# Patient Record
Sex: Female | Born: 1945
Health system: Southern US, Community
[De-identification: ages and names within clinical notes are randomized; demographics above are authoritative.]

## PROBLEM LIST (undated history)

## (undated) DIAGNOSIS — R112 Nausea with vomiting, unspecified: Secondary | ICD-10-CM

## (undated) DIAGNOSIS — Z9889 Other specified postprocedural states: Secondary | ICD-10-CM

## (undated) DIAGNOSIS — K5790 Diverticulosis of intestine, part unspecified, without perforation or abscess without bleeding: Secondary | ICD-10-CM

## (undated) DIAGNOSIS — Z5189 Encounter for other specified aftercare: Secondary | ICD-10-CM

## (undated) DIAGNOSIS — IMO0002 Reserved for concepts with insufficient information to code with codable children: Secondary | ICD-10-CM

## (undated) DIAGNOSIS — K7581 Nonalcoholic steatohepatitis (NASH): Secondary | ICD-10-CM

## (undated) DIAGNOSIS — E785 Hyperlipidemia, unspecified: Secondary | ICD-10-CM

## (undated) DIAGNOSIS — I219 Acute myocardial infarction, unspecified: Secondary | ICD-10-CM

## (undated) DIAGNOSIS — I8290 Acute embolism and thrombosis of unspecified vein: Secondary | ICD-10-CM

## (undated) DIAGNOSIS — T7840XA Allergy, unspecified, initial encounter: Secondary | ICD-10-CM

## (undated) DIAGNOSIS — E1165 Type 2 diabetes mellitus with hyperglycemia: Secondary | ICD-10-CM

## (undated) DIAGNOSIS — E669 Obesity, unspecified: Secondary | ICD-10-CM

## (undated) DIAGNOSIS — I10 Essential (primary) hypertension: Secondary | ICD-10-CM

## (undated) DIAGNOSIS — N329 Bladder disorder, unspecified: Secondary | ICD-10-CM

## (undated) DIAGNOSIS — R569 Unspecified convulsions: Secondary | ICD-10-CM

## (undated) DIAGNOSIS — G709 Myoneural disorder, unspecified: Secondary | ICD-10-CM

## (undated) DIAGNOSIS — M4802 Spinal stenosis, cervical region: Secondary | ICD-10-CM

## (undated) DIAGNOSIS — E876 Hypokalemia: Secondary | ICD-10-CM

## (undated) DIAGNOSIS — M1990 Unspecified osteoarthritis, unspecified site: Secondary | ICD-10-CM

## (undated) DIAGNOSIS — D689 Coagulation defect, unspecified: Secondary | ICD-10-CM

## (undated) DIAGNOSIS — T4145XA Adverse effect of unspecified anesthetic, initial encounter: Secondary | ICD-10-CM

## (undated) DIAGNOSIS — R0600 Dyspnea, unspecified: Secondary | ICD-10-CM

## (undated) DIAGNOSIS — K644 Residual hemorrhoidal skin tags: Secondary | ICD-10-CM

## (undated) DIAGNOSIS — N261 Atrophy of kidney (terminal): Secondary | ICD-10-CM

## (undated) DIAGNOSIS — I2510 Atherosclerotic heart disease of native coronary artery without angina pectoris: Secondary | ICD-10-CM

## (undated) DIAGNOSIS — N200 Calculus of kidney: Secondary | ICD-10-CM

## (undated) DIAGNOSIS — T8859XA Other complications of anesthesia, initial encounter: Secondary | ICD-10-CM

## (undated) DIAGNOSIS — R011 Cardiac murmur, unspecified: Secondary | ICD-10-CM

## (undated) DIAGNOSIS — K219 Gastro-esophageal reflux disease without esophagitis: Secondary | ICD-10-CM

## (undated) DIAGNOSIS — C801 Malignant (primary) neoplasm, unspecified: Secondary | ICD-10-CM

## (undated) DIAGNOSIS — IMO0001 Reserved for inherently not codable concepts without codable children: Secondary | ICD-10-CM

## (undated) DIAGNOSIS — N390 Urinary tract infection, site not specified: Secondary | ICD-10-CM

## (undated) HISTORY — DX: Other specified postprocedural states: Z98.890

## (undated) HISTORY — DX: Cardiac murmur, unspecified: R01.1

## (undated) HISTORY — DX: Diverticulosis of intestine, part unspecified, without perforation or abscess without bleeding: K57.90

## (undated) HISTORY — PX: VAGINAL HYSTERECTOMY: SUR661

## (undated) HISTORY — PX: TONSILLECTOMY: SUR1361

## (undated) HISTORY — PX: CATARACT EXTRACTION: SUR2

## (undated) HISTORY — DX: Obesity, unspecified: E66.9

## (undated) HISTORY — DX: Gastro-esophageal reflux disease without esophagitis: K21.9

## (undated) HISTORY — DX: Essential (primary) hypertension: I10

## (undated) HISTORY — DX: Spinal stenosis, cervical region: M48.02

## (undated) HISTORY — DX: Atrophy of kidney (terminal): N26.1

## (undated) HISTORY — PX: UPPER GI ENDOSCOPY: SHX6162

## (undated) HISTORY — PX: BREAST MASS EXCISION: SHX1267

## (undated) HISTORY — PX: SALPINGOOPHORECTOMY: SHX82

## (undated) HISTORY — DX: Unspecified osteoarthritis, unspecified site: M19.90

## (undated) HISTORY — DX: Hyperlipidemia, unspecified: E78.5

## (undated) HISTORY — DX: Type 2 diabetes mellitus with hyperglycemia: E11.65

## (undated) HISTORY — DX: Encounter for other specified aftercare: Z51.89

## (undated) HISTORY — DX: Urinary tract infection, site not specified: N39.0

## (undated) HISTORY — DX: Reserved for inherently not codable concepts without codable children: IMO0001

## (undated) HISTORY — DX: Hypokalemia: E87.6

## (undated) HISTORY — PX: COLONOSCOPY: SHX174

## (undated) HISTORY — DX: Calculus of kidney: N20.0

## (undated) HISTORY — DX: Myoneural disorder, unspecified: G70.9

## (undated) HISTORY — DX: Allergy, unspecified, initial encounter: T78.40XA

## (undated) HISTORY — PX: BREAST CYST ASPIRATION: SHX578

## (undated) HISTORY — DX: Atherosclerotic heart disease of native coronary artery without angina pectoris: I25.10

## (undated) HISTORY — DX: Nonalcoholic steatohepatitis (NASH): K75.81

## (undated) HISTORY — DX: Acute myocardial infarction, unspecified: I21.9

## (undated) HISTORY — PX: URETEROTOMY: SHX496

## (undated) HISTORY — DX: Residual hemorrhoidal skin tags: K64.4

## (undated) HISTORY — DX: Coagulation defect, unspecified: D68.9

## (undated) HISTORY — DX: Reserved for concepts with insufficient information to code with codable children: IMO0002

---
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
## 1980-05-04 HISTORY — PX: ABDOMINAL HYSTERECTOMY: SHX81

## 1989-01-02 HISTORY — PX: BREAST CYST ASPIRATION: SHX578

## 1998-05-04 DIAGNOSIS — I219 Acute myocardial infarction, unspecified: Secondary | ICD-10-CM

## 1998-05-04 HISTORY — DX: Acute myocardial infarction, unspecified: I21.9

## 1998-05-04 HISTORY — PX: PTCA: SHX146

## 1998-05-04 HISTORY — PX: CARDIAC CATHETERIZATION: SHX172

## 1998-05-04 HISTORY — PX: OTHER SURGICAL HISTORY: SHX169

## 2003-05-05 DIAGNOSIS — K5790 Diverticulosis of intestine, part unspecified, without perforation or abscess without bleeding: Secondary | ICD-10-CM

## 2003-05-05 DIAGNOSIS — Z9889 Other specified postprocedural states: Secondary | ICD-10-CM

## 2003-05-05 HISTORY — DX: Other specified postprocedural states: Z98.890

## 2003-05-05 HISTORY — DX: Diverticulosis of intestine, part unspecified, without perforation or abscess without bleeding: K57.90

## 2003-12-25 LAB — HM COLONOSCOPY: HM Colonoscopy: NORMAL

## 2004-05-23 ENCOUNTER — Ambulatory Visit: Payer: Self-pay | Admitting: General Surgery

## 2005-03-10 ENCOUNTER — Ambulatory Visit: Payer: Self-pay | Admitting: General Surgery

## 2006-03-18 ENCOUNTER — Ambulatory Visit: Payer: Self-pay | Admitting: General Surgery

## 2006-05-04 HISTORY — PX: FOOT SURGERY: SHX648

## 2007-04-05 ENCOUNTER — Ambulatory Visit: Payer: Self-pay | Admitting: General Surgery

## 2008-04-05 ENCOUNTER — Ambulatory Visit: Payer: Self-pay | Admitting: General Surgery

## 2008-08-22 ENCOUNTER — Ambulatory Visit: Payer: Self-pay | Admitting: Family

## 2008-08-30 ENCOUNTER — Ambulatory Visit: Payer: Self-pay | Admitting: Internal Medicine

## 2009-02-21 ENCOUNTER — Ambulatory Visit: Payer: Self-pay | Admitting: Internal Medicine

## 2009-04-09 ENCOUNTER — Ambulatory Visit: Payer: Self-pay | Admitting: General Surgery

## 2010-02-13 ENCOUNTER — Ambulatory Visit: Payer: Self-pay | Admitting: Internal Medicine

## 2010-05-04 DIAGNOSIS — R011 Cardiac murmur, unspecified: Secondary | ICD-10-CM

## 2010-05-04 HISTORY — DX: Cardiac murmur, unspecified: R01.1

## 2010-05-04 HISTORY — PX: EYE SURGERY: SHX253

## 2010-05-07 ENCOUNTER — Ambulatory Visit: Payer: Self-pay | Admitting: General Surgery

## 2010-05-30 DIAGNOSIS — K7581 Nonalcoholic steatohepatitis (NASH): Secondary | ICD-10-CM

## 2010-05-30 HISTORY — DX: Nonalcoholic steatohepatitis (NASH): K75.81

## 2010-07-01 ENCOUNTER — Encounter (INDEPENDENT_AMBULATORY_CARE_PROVIDER_SITE_OTHER): Payer: Self-pay | Admitting: *Deleted

## 2010-07-10 ENCOUNTER — Other Ambulatory Visit: Payer: Self-pay | Admitting: Internal Medicine

## 2010-07-10 NOTE — Letter (Signed)
Summary: New Patient letter  [facility]  [address] [place], [place] [address]   Phone: [phone]  Fax: [phone]       07/01/2010 MRN: [id number]  [facility] [place], [place]  [address]  Dear Kimberly Huff,  Welcome to the Gastroenterology Division at [facility].    You are scheduled to see Dr.  Delfin Edis on August 12, 2010 at 9:15am on the 3rd floor at [facility] [place] [facility].  We ask that you try to arrive at our office 15 minutes prior to your appointment time to allow for check-in.  We would like you to complete the enclosed self-administered evaluation form prior to your visit and bring it with you on the day of your appointment.  We will review it with you.  Also, please bring a complete list of all your medications or, if you prefer, bring the medication bottles and we will list them.  Please bring your insurance card so that we may make a copy of it.  If your insurance requires a referral to see a specialist, please bring your referral form from your primary care physician.  Co-payments are due at the time of your visit and may be paid by cash, check or credit card.     Your office visit will consist of a consult with your physician (includes a physical exam), any laboratory testing he/she may order, scheduling of any necessary diagnostic testing (e.g. x-ray, ultrasound, CT-scan), and scheduling of a procedure (e.g. Endoscopy, Colonoscopy) if required.  Please allow enough time on your schedule to allow for any/all of these possibilities.    If you cannot keep your appointment, please call [phone] to cancel or reschedule prior to your appointment date.  This allows [place] the opportunity to schedule an appointment for another patient in need of care.  If you do not cancel or reschedule by 5 p.m. the business day prior to your appointment date, you will be charged a $50.00 late cancellation/no-show fee.    Thank you for choosing  [facility] for your medical needs.  We appreciate the opportunity to care for you.  Please visit [place] at our website  to learn more about our practice.                     Sincerely,                                                             The Gastroenterology Division

## 2010-08-12 ENCOUNTER — Ambulatory Visit (INDEPENDENT_AMBULATORY_CARE_PROVIDER_SITE_OTHER): Payer: BLUE CROSS/BLUE SHIELD | Admitting: Internal Medicine

## 2010-08-12 ENCOUNTER — Encounter: Payer: Self-pay | Admitting: Internal Medicine

## 2010-08-12 DIAGNOSIS — K7460 Unspecified cirrhosis of liver: Secondary | ICD-10-CM

## 2010-08-12 DIAGNOSIS — K766 Portal hypertension: Secondary | ICD-10-CM

## 2010-08-12 NOTE — Progress Notes (Signed)
Kimberly Huff November 27, 1945 MRN [id number]    History of Present Illness:  This is a 65 year old white female with a new diagnosis of nonalcoholic steatohepatitis and early cirrhosis based on a liver biopsy at [facility] in January 2012; there was a question of autoimmune hepatitis because of positive anti-smooth muscle antibody. The liver biopsy leaned toward steatohepatitis with stage III fibrosis, periportal and central lobular fibrosis, 34-66% steatosis, mild to moderate portal inflammation. The morphological features of liver biopsy did not support autoimmune hepatitis. A CT scan of the abdomen in February 2012 showed mild splenomegaly at 12 cm with no varices. It also showed fatty liver and patent portal vein. Her alpha-fetoprotein is normal at 6.5. Her last colonoscopy in 2005 showed diverticulosis. She has been on a low fat, carbohydrate modified diet and has lost several pounds. She is here to schedule an upper endoscopy to rule out esophageal varices. I have reviewed her extensive history from [facility].   Past Medical History  Diagnosis Date  . Kidney stone   . Diverticulosis 2005    Dr Jamal Collin  . Coronary artery disease   . Hypertension   . Hyperlipidemia   . Type II or unspecified type diabetes mellitus without mention of complication, uncontrolled   . Hypopotassemia   . Atrophic kidney     right  . Osteoarthritis   . Coronary atherosclerosis   . Obesity   . Myocardial infarction 2000  . Recurrent UTI   . Steatohepatitis 05/30/10    Brunt Stage 3   Past Surgical History  Procedure Date  . Breast mass excision     benign  . Ureterotomy 1960/1969    x 2 during childhood  . Abdominal hysterectomy 1982    complete  . Ptca 2000    reports that she quit smoking about 11 years ago. Her smoking use included Cigarettes. She has never used smokeless tobacco. She reports that she drinks alcohol. She reports that she does not use illicit drugs. family history includes Autoimmune  disease in her daughter; Diverticulosis in her mother; Lung cancer in her father; and Thyroid disease in her daughter and sister. No Known Allergies      Review of Systems: Denies chest pain, dysphagia, heartburn, abdominal pain, swelling or bruising.  The remainder of the 10  point ROS is negative except as outlined in H&P   Physical Exam: General appearance  Well developed, in no distress, overweight. Eyes- non icteric. HEENT nontraumatic, normocephalic. Mouth no lesions, tongue papillated, no cheilosis. Neck supple without adenopathy, thyroid not enlarged, no carotid bruits, no JVD. Lungs Clear to auscultation bilaterally. Cor normal S1, normal S2, regular rhythm , no murmur,  quiet precordium. Abdomen obese, soft. Mild tenderness in right lower quadrant. Liver edge at costal margin. Overall span about 8 cm. Splenic tip not palpable. No ascites. Rectal: Not done. Extremities no pedal edema. Skin early palmar erythema. No Dupuytren's contractures. Neurological alert and oriented x 3, no asterixis. Psychological normal mood and affect.  Assessment and Plan:  Problems #1 early cirrhosis due to steatohepatitis. Patient also has mild portal hypertension. There were no varices seen on her CT scan but she will be scheduled for an upper endoscopy to assess closer. She is currently asymptomatic and is working on significant weight loss. I have explained to her the significance of esophageal varices.   Problem #2 neoplastic screening for colon cancer. Her last colonoscopy was in 2005. A recall colonoscopy will be due in 2015.  Problem #3 positive anti-smooth muscle antibody.  This is not likely causing her liver disease.   08/12/2010 Delfin Edis

## 2010-08-12 NOTE — Patient Instructions (Addendum)
You have been scheduled for an endoscopy on 08/21/10. Please follow written instructions given to you at your visit today.  Dr Derrel Nip,

## 2010-08-20 ENCOUNTER — Encounter: Payer: Self-pay | Admitting: Internal Medicine

## 2010-08-21 ENCOUNTER — Telehealth: Payer: Self-pay | Admitting: Internal Medicine

## 2010-08-21 ENCOUNTER — Encounter: Payer: Self-pay | Admitting: Internal Medicine

## 2010-08-21 ENCOUNTER — Ambulatory Visit (AMBULATORY_SURGERY_CENTER): Payer: BLUE CROSS/BLUE SHIELD | Admitting: Internal Medicine

## 2010-08-21 VITALS — BP 116/77 | HR 68 | Temp 98.8°F | Resp 18 | Ht 63.0 in | Wt 200.0 lb

## 2010-08-21 DIAGNOSIS — I8500 Esophageal varices without bleeding: Secondary | ICD-10-CM

## 2010-08-21 DIAGNOSIS — K2970 Gastritis, unspecified, without bleeding: Secondary | ICD-10-CM

## 2010-08-21 DIAGNOSIS — K7689 Other specified diseases of liver: Secondary | ICD-10-CM

## 2010-08-21 DIAGNOSIS — K319 Disease of stomach and duodenum, unspecified: Secondary | ICD-10-CM

## 2010-08-21 DIAGNOSIS — K259 Gastric ulcer, unspecified as acute or chronic, without hemorrhage or perforation: Secondary | ICD-10-CM

## 2010-08-21 LAB — GLUCOSE, CAPILLARY
Glucose-Capillary: 147 mg/dL — ABNORMAL HIGH (ref 70–99)
Glucose-Capillary: 125 mg/dL — ABNORMAL HIGH (ref 70–99)

## 2010-08-21 MED ORDER — SODIUM CHLORIDE 0.9 % IV SOLN: 500.0000 mL | INTRAVENOUS | Status: DC

## 2010-08-21 MED ORDER — ESOMEPRAZOLE MAGNESIUM 20 MG PO CPDR: 20.0000 mg | DELAYED_RELEASE_CAPSULE | Freq: Every day | ORAL | Status: DC

## 2010-08-21 NOTE — Telephone Encounter (Signed)
Pt states that Nexium is too expensive and needs a new medication rx.  Per Dr. Olevia Perches, ok to change to Prilosec 67m daily, #30, 12 refills.  [place]

## 2010-08-21 NOTE — Patient Instructions (Signed)
Please read the handout given to you by the recovery room nurse.  Do not take NSAIDS.  Take your nexium for 6-8 wks EVERYDAY.  Call us if you have any questions or concerns at [phone].   Take your other regular medicines.  Thank-you.

## 2010-08-22 ENCOUNTER — Telehealth: Payer: Self-pay | Admitting: Internal Medicine

## 2010-08-22 ENCOUNTER — Other Ambulatory Visit: Payer: Self-pay | Admitting: *Deleted

## 2010-08-22 ENCOUNTER — Telehealth: Payer: Self-pay

## 2010-08-22 ENCOUNTER — Telehealth: Payer: Self-pay | Admitting: *Deleted

## 2010-08-22 DIAGNOSIS — K2970 Gastritis, unspecified, without bleeding: Secondary | ICD-10-CM

## 2010-08-22 NOTE — Telephone Encounter (Signed)
Routed to C. Mccoy RN to resend rx

## 2010-08-22 NOTE — Telephone Encounter (Signed)
Attempted to send RX to pharmacy, but not able to.  Verbal order given to pharmacy tech at [facility] in [place], [place].  Prilosec 20 mg po qd, #30, 11 refills given verbally per C McCoy RN

## 2010-08-22 NOTE — Telephone Encounter (Signed)
No answer/ answering machine picked up but no ID, no message left. MAW, RN

## 2010-08-27 ENCOUNTER — Encounter: Payer: Self-pay | Admitting: Internal Medicine

## 2010-12-25 ENCOUNTER — Encounter: Payer: Self-pay | Admitting: *Deleted

## 2010-12-25 NOTE — Telephone Encounter (Signed)
Erroneous encounter

## 2011-02-16 ENCOUNTER — Encounter: Payer: Self-pay | Admitting: Internal Medicine

## 2011-02-16 ENCOUNTER — Ambulatory Visit (INDEPENDENT_AMBULATORY_CARE_PROVIDER_SITE_OTHER): Payer: 59 | Admitting: Internal Medicine

## 2011-02-16 DIAGNOSIS — E119 Type 2 diabetes mellitus without complications: Secondary | ICD-10-CM

## 2011-02-16 DIAGNOSIS — Z9849 Cataract extraction status, unspecified eye: Secondary | ICD-10-CM

## 2011-02-16 DIAGNOSIS — K7460 Unspecified cirrhosis of liver: Secondary | ICD-10-CM | POA: Insufficient documentation

## 2011-02-16 DIAGNOSIS — K7581 Nonalcoholic steatohepatitis (NASH): Secondary | ICD-10-CM

## 2011-02-16 DIAGNOSIS — R52 Pain, unspecified: Secondary | ICD-10-CM

## 2011-02-16 DIAGNOSIS — N289 Disorder of kidney and ureter, unspecified: Secondary | ICD-10-CM | POA: Insufficient documentation

## 2011-02-16 DIAGNOSIS — L0292 Furuncle, unspecified: Secondary | ICD-10-CM

## 2011-02-16 DIAGNOSIS — N1330 Unspecified hydronephrosis: Secondary | ICD-10-CM

## 2011-02-16 DIAGNOSIS — E785 Hyperlipidemia, unspecified: Secondary | ICD-10-CM

## 2011-02-16 DIAGNOSIS — E669 Obesity, unspecified: Secondary | ICD-10-CM

## 2011-02-16 DIAGNOSIS — Z23 Encounter for immunization: Secondary | ICD-10-CM

## 2011-02-16 DIAGNOSIS — K7689 Other specified diseases of liver: Secondary | ICD-10-CM

## 2011-02-16 DIAGNOSIS — L0293 Carbuncle, unspecified: Secondary | ICD-10-CM

## 2011-02-16 DIAGNOSIS — E1169 Type 2 diabetes mellitus with other specified complication: Secondary | ICD-10-CM | POA: Insufficient documentation

## 2011-02-16 MED ORDER — OXYCODONE HCL 5 MG PO CAPS: 5.0000 mg | ORAL_CAPSULE | ORAL | Status: AC | PRN

## 2011-02-16 MED ORDER — SULFAMETHOXAZOLE-TMP DS 800-160 MG PO TABS: 1.0000 | ORAL_TABLET | Freq: Two times a day (BID) | ORAL | Status: AC

## 2011-02-16 MED ORDER — SIMVASTATIN 20 MG PO TABS: 20.0000 mg | ORAL_TABLET | Freq: Every day | ORAL | Status: DC

## 2011-02-16 NOTE — Patient Instructions (Signed)
Suspend the metformin to see if diarrhea stops.  We are reducing simvastatin to 20 mg daily  .  Use the  bactrim/Septra for next occurrence of of boil

## 2011-02-16 NOTE — Assessment & Plan Note (Addendum)
She has lost 30 lbs since April 2012 simply by avoiding starches .

## 2011-02-16 NOTE — Assessment & Plan Note (Signed)
LDL is below 70 but HDL is 21 which is lower than April 2012 despite adherence to the Mediterranean diet and daily ingestion of nuts and avocadoes.  Prior trial of niacin and fish oil were not helpful.  Will reduce her simvastatin to 20 mg daily and recheck in 3 months.

## 2011-02-16 NOTE — Assessment & Plan Note (Signed)
Stable , followed by Dr. Gerald Dexter at [facility].

## 2011-02-16 NOTE — Progress Notes (Signed)
  Subjective:    Patient ID: Kimberly Huff, female    DOB: [birth year], 65 y.o.   MRN: [id number]  HPI  Review of Systems     Objective:   Physical Exam        Assessment & Plan:   Subjective:     Kimberly Huff is a 65 y.o. female who presents for follow up of diabetes.. Current symptoms include: diarrhea. Patient denies foot ulcerations, hypoglycemia , nausea and paresthesia of the feet. Evaluation to date has Huff: fasting lipid panel, hemoglobin A1C and microalbuminuria. Home sugars: BGs consistently in an acceptable range. Current treatments: more intensive attention to diet which has Huff effective, low cholesterol diet which has Huff effective, weight loss of 30 lbs which has Huff effective and Continued metformin which has Huff effective. Last dilated eye exam within the last year.   The following portions of the patient's history were reviewed and updated as appropriate: allergies, current medications, past family history, past medical history, past social history, past surgical history and problem list.  Review of Systems A comprehensive review of systems was negative except for: Gastrointestinal: positive for diarrhea    Objective:    BP 123/85  Pulse 68  Temp(Src) 98.5 F (36.9 C) (Oral)  Resp 16  Ht 5' 3.5" (1.613 m)  Wt 179 lb 4 oz (81.307 kg)  BMI 31.25 kg/m2  SpO2 97% General appearance: alert, cooperative and appears stated age Eyes: conjunctivae/corneas clear. PERRL, EOM's intact. Fundi benign. Ears: normal TM's and external ear canals both ears Throat: lips, mucosa, and tongue normal; teeth and gums normal Neck: no adenopathy, no carotid bruit, no JVD, supple, symmetrical, trachea midline and thyroid not enlarged, symmetric, no tenderness/mass/nodules Lungs: clear to auscultation bilaterally Heart: regular rate and rhythm, S1, S2 normal, no murmur, click, rub or gallop Abdomen: soft, non-tender; bowel sounds normal; no masses,  no organomegaly Extremities:  extremities normal, atraumatic, no cyanosis or edema Pulses: 2+ and symmetric    @DMFOOTEXAM @  Patient was evaluated for proper footwear and sizing.  Laboratory: No components found with this basename: A1C      Assessment:    Diabetes mellitus Type II, under excellent control.    Plan:    Discussed general issues about diabetes pathophysiology and management. Discontinued metformin; see  medication orders.

## 2011-02-25 ENCOUNTER — Telehealth: Payer: Self-pay | Admitting: Internal Medicine

## 2011-02-25 DIAGNOSIS — K219 Gastro-esophageal reflux disease without esophagitis: Secondary | ICD-10-CM

## 2011-02-25 DIAGNOSIS — I10 Essential (primary) hypertension: Secondary | ICD-10-CM

## 2011-02-25 MED ORDER — POTASSIUM CHLORIDE 10 MEQ PO TBCR: 10.0000 meq | EXTENDED_RELEASE_TABLET | Freq: Every day | ORAL | Status: DC

## 2011-02-25 MED ORDER — OMEPRAZOLE 20 MG PO CPDR: 20.0000 mg | DELAYED_RELEASE_CAPSULE | Freq: Every day | ORAL | Status: DC

## 2011-02-25 MED ORDER — FUROSEMIDE 40 MG PO TABS: 40.0000 mg | ORAL_TABLET | Freq: Every day | ORAL | Status: DC

## 2011-02-25 MED ORDER — LOSARTAN POTASSIUM-HCTZ 50-12.5 MG PO TABS: 1.0000 | ORAL_TABLET | Freq: Every day | ORAL | Status: DC

## 2011-02-25 NOTE — Telephone Encounter (Signed)
Pt called  To get rx  She didn't get these when she was in last week Furosemide 74m hyzarr 12.5 mg Potassium  Omeprazole  1 year rx  [facility]

## 2011-02-25 NOTE — Telephone Encounter (Signed)
Done

## 2011-02-25 NOTE — Telephone Encounter (Signed)
[place] for RF's X 1 year?

## 2011-03-04 NOTE — Telephone Encounter (Signed)
Addended by: Emeline Gins on: 03/04/2011 03:05 PM   Modules accepted: Orders

## 2011-04-21 ENCOUNTER — Encounter: Payer: Self-pay | Admitting: Internal Medicine

## 2011-04-21 ENCOUNTER — Ambulatory Visit (INDEPENDENT_AMBULATORY_CARE_PROVIDER_SITE_OTHER): Payer: 59 | Admitting: Internal Medicine

## 2011-04-21 VITALS — BP 121/76 | HR 82 | Temp 99.1°F | Wt 182.8 lb

## 2011-04-21 DIAGNOSIS — E1169 Type 2 diabetes mellitus with other specified complication: Secondary | ICD-10-CM

## 2011-04-21 DIAGNOSIS — M109 Gout, unspecified: Secondary | ICD-10-CM

## 2011-04-21 DIAGNOSIS — K219 Gastro-esophageal reflux disease without esophagitis: Secondary | ICD-10-CM

## 2011-04-21 DIAGNOSIS — E785 Hyperlipidemia, unspecified: Secondary | ICD-10-CM

## 2011-04-21 DIAGNOSIS — E119 Type 2 diabetes mellitus without complications: Secondary | ICD-10-CM

## 2011-04-21 DIAGNOSIS — E669 Obesity, unspecified: Secondary | ICD-10-CM

## 2011-04-21 MED ORDER — OMEPRAZOLE 40 MG PO CPDR: 40.0000 mg | DELAYED_RELEASE_CAPSULE | Freq: Every day | ORAL | Status: DC

## 2011-04-21 MED ORDER — IBUPROFEN 800 MG PO TABS: 800.0000 mg | ORAL_TABLET | Freq: Three times a day (TID) | ORAL | Status: AC | PRN

## 2011-04-21 NOTE — Assessment & Plan Note (Signed)
She has gained 3 lbs in the last 2 months after a 30 lb weight loss achieved with starch restriction.  Encouragement given.

## 2011-04-21 NOTE — Assessment & Plan Note (Signed)
She has the classic manifestation involving her great toe on the right.  Will treat with NSAIDS, pain relievers,  This is her first episode in several months.  Will add a uric acid level to her next lab draw and tret with allopurinol for goal < 6.0

## 2011-04-21 NOTE — Patient Instructions (Addendum)
Take 4 ibuprofen every 8 hours for the gout flare.  Stop when the redness and swelling improve or immediately if you develop abdominal pain .  Take tramadol along with ibuprofen for pain control.  Take 2 omeprazole (40 mg ) daily while taking the ibuprofen

## 2011-04-21 NOTE — Progress Notes (Signed)
  Subjective:    Patient ID: Kimberly Huff, female    DOB: 1945/07/06, 65 y.o.   MRN: [id number]  HPI  Kimberly Huff is a 65 yo white female with a history of NASH, DM, na d hypertension who presents with a 2 day history of pain, swelling and erythema of her right great toe.  Symptoms begain 2 days ago .  The toe is painful to touch or bear weight.  She has taken tramadol without relief of pain but aspirin has provided moderate relief. She has a history of previous episodes of pain and erythema but none in over one year. No history of trauma but she did walk for several hours last weekend.    Review of Systems  Constitutional: Negative for fever, chills and unexpected weight change.  HENT: Negative for hearing loss, ear pain, nosebleeds, congestion, sore throat, facial swelling, rhinorrhea, sneezing, mouth sores, trouble swallowing, neck pain, neck stiffness, voice change, postnasal drip, sinus pressure, tinnitus and ear discharge.   Eyes: Negative for pain, discharge, redness and visual disturbance.  Respiratory: Negative for cough, chest tightness, shortness of breath, wheezing and stridor.   Cardiovascular: Negative for chest pain, palpitations and leg swelling.  Musculoskeletal: Negative for myalgias and arthralgias.  Skin: Negative for color change and rash.  Neurological: Negative for dizziness, weakness, light-headedness and headaches.  Hematological: Negative for adenopathy.       Objective:   Physical Exam  Constitutional: She is oriented to person, place, and time. She appears well-developed and well-nourished.  HENT:  Mouth/Throat: Oropharynx is clear and moist.  Eyes: EOM are normal. Pupils are equal, round, and reactive to light. No scleral icterus.  Neck: Normal range of motion. Neck supple. No JVD present. No thyromegaly present.  Cardiovascular: Normal rate, regular rhythm, normal heart sounds and intact distal pulses.   Pulmonary/Chest: Effort normal and breath sounds normal.    Abdominal: Soft. Bowel sounds are normal. She exhibits no mass. There is no tenderness.  Musculoskeletal: Normal range of motion. She exhibits edema.       Feet:  Lymphadenopathy:    She has no cervical adenopathy.  Neurological: She is alert and oriented to person, place, and time.  Skin: Skin is warm and dry.  Psychiatric: She has a normal mood and affect.          Assessment & Plan:

## 2011-04-21 NOTE — Assessment & Plan Note (Signed)
She is due for repeat lipids,  Had very low HDL and LDL was at goal last visit.

## 2011-04-24 ENCOUNTER — Other Ambulatory Visit: Payer: Self-pay | Admitting: *Deleted

## 2011-04-24 ENCOUNTER — Telehealth: Payer: Self-pay | Admitting: Internal Medicine

## 2011-04-24 DIAGNOSIS — M109 Gout, unspecified: Secondary | ICD-10-CM

## 2011-04-24 MED ORDER — PREDNISONE (PAK) 10 MG PO TABS: ORAL_TABLET | ORAL | Status: AC

## 2011-04-24 MED ORDER — METFORMIN HCL 500 MG PO TABS: 500.0000 mg | ORAL_TABLET | Freq: Two times a day (BID) | ORAL | Status: DC

## 2011-04-24 NOTE — Telephone Encounter (Signed)
Patient states her foot is still bothering her and wanted to know if you could call in the prednisone that you offered her on Tuesday.  [facility] [address]

## 2011-04-24 NOTE — Telephone Encounter (Signed)
I called Kimberly Huff and advised that Dr. Derrel Nip had called in the prescription and advised her that she might want to call the drug store before going for prescription.

## 2011-04-24 NOTE — Telephone Encounter (Signed)
[facility]

## 2011-05-11 ENCOUNTER — Ambulatory Visit: Payer: Self-pay | Admitting: General Surgery

## 2011-05-26 ENCOUNTER — Encounter: Payer: Self-pay | Admitting: Internal Medicine

## 2011-05-26 ENCOUNTER — Ambulatory Visit (INDEPENDENT_AMBULATORY_CARE_PROVIDER_SITE_OTHER): Payer: 59 | Admitting: Internal Medicine

## 2011-05-26 DIAGNOSIS — K649 Unspecified hemorrhoids: Secondary | ICD-10-CM

## 2011-05-26 DIAGNOSIS — M109 Gout, unspecified: Secondary | ICD-10-CM

## 2011-05-26 DIAGNOSIS — E118 Type 2 diabetes mellitus with unspecified complications: Secondary | ICD-10-CM

## 2011-05-26 DIAGNOSIS — E119 Type 2 diabetes mellitus without complications: Secondary | ICD-10-CM

## 2011-05-26 DIAGNOSIS — E1169 Type 2 diabetes mellitus with other specified complication: Secondary | ICD-10-CM

## 2011-05-26 DIAGNOSIS — E039 Hypothyroidism, unspecified: Secondary | ICD-10-CM

## 2011-05-26 DIAGNOSIS — E785 Hyperlipidemia, unspecified: Secondary | ICD-10-CM

## 2011-05-26 LAB — LIPID PANEL
Cholesterol: 155 mg/dL (ref 0–200)
HDL: 31.8 mg/dL — ABNORMAL LOW (ref >39.00)
Total CHOL/HDL Ratio: 5
Triglycerides: 202 mg/dL — ABNORMAL HIGH (ref 0.0–149.0)
VLDL: 40.4 mg/dL — ABNORMAL HIGH (ref 0.0–40.0)

## 2011-05-26 LAB — URIC ACID: Uric Acid, Serum: 8.1 mg/dL — ABNORMAL HIGH (ref 2.4–7.0)

## 2011-05-26 LAB — HEMOGLOBIN A1C: Hgb A1c MFr Bld: 5.7 % (ref 4.6–6.5)

## 2011-05-26 LAB — LDL CHOLESTEROL, DIRECT: Direct LDL: 94.1 mg/dL

## 2011-05-26 LAB — TSH: TSH: 0.51 u[IU]/mL (ref 0.35–5.50)

## 2011-05-26 MED ORDER — HYDROCORTISONE 2.5 % RE CREA: TOPICAL_CREAM | Freq: Two times a day (BID) | RECTAL | Status: AC

## 2011-05-26 MED ORDER — TRAMADOL HCL 50 MG PO TABS: 50.0000 mg | ORAL_TABLET | Freq: Four times a day (QID) | ORAL | Status: DC | PRN

## 2011-05-26 MED ORDER — OXYCODONE-ACETAMINOPHEN 5-325 MG PO TABS: 1.0000 | ORAL_TABLET | Freq: Three times a day (TID) | ORAL | Status: AC | PRN

## 2011-05-26 MED ORDER — LOSARTAN POTASSIUM 50 MG PO TABS: 50.0000 mg | ORAL_TABLET | Freq: Every day | ORAL | Status: DC

## 2011-05-26 NOTE — Progress Notes (Signed)
  Subjective:    Patient ID: Kimberly Huff, female    DOB: December 07, 1945, 66 y.o.   MRN: [id number]  HPI  [name] . is a 66 year old white female with a history of fatty liver, diabetes mellitu,s and coronary artery disease who returns for three-month followup on diabetes. Over the last several weeks she has had a gout flare. This has nearly resolved but was followed by a skin boil which was treated with antibiotics. Following resolution of the skin boil she had onset of low back pain radiating to her groin on both sides which she treated with massage and nonsteroidal anti-inflammatories    Two days ago had a massive diarrheal episode, which resolved but has left her with a large irritated hemorrhoid.         Review of Systems  Constitutional: Negative for fever, chills and unexpected weight change.  HENT: Negative for hearing loss, ear pain, nosebleeds, congestion, sore throat, facial swelling, rhinorrhea, sneezing, mouth sores, trouble swallowing, neck pain, neck stiffness, voice change, postnasal drip, sinus pressure, tinnitus and ear discharge.   Eyes: Negative for pain, discharge, redness and visual disturbance.  Respiratory: Negative for cough, chest tightness, shortness of breath, wheezing and stridor.   Cardiovascular: Negative for chest pain, palpitations and leg swelling.  Musculoskeletal: Negative for myalgias and arthralgias.  Skin: Negative for color change and rash.  Neurological: Negative for dizziness, weakness, light-headedness and headaches.  Hematological: Negative for adenopathy.       Objective:   Physical Exam  Constitutional: She is oriented to person, place, and time. She appears well-developed and well-nourished.  HENT:  Mouth/Throat: Oropharynx is clear and moist.  Eyes: EOM are normal. Pupils are equal, round, and reactive to light. No scleral icterus.  Neck: Normal range of motion. Neck supple. No JVD present. No thyromegaly present.  Cardiovascular: Normal rate,  regular rhythm, normal heart sounds and intact distal pulses.   Pulmonary/Chest: Effort normal and breath sounds normal.  Abdominal: Soft. Bowel sounds are normal. She exhibits no mass. There is no tenderness.  Genitourinary:     Musculoskeletal: Normal range of motion. She exhibits no edema.  Lymphadenopathy:    She has no cervical adenopathy.  Neurological: She is alert and oriented to person, place, and time.  Skin: Skin is warm and dry.  Psychiatric: She has a normal mood and affect.          Assessment & Plan:

## 2011-05-27 ENCOUNTER — Encounter: Payer: Self-pay | Admitting: Internal Medicine

## 2011-05-27 DIAGNOSIS — E1129 Type 2 diabetes mellitus with other diabetic kidney complication: Secondary | ICD-10-CM | POA: Insufficient documentation

## 2011-05-27 DIAGNOSIS — K649 Unspecified hemorrhoids: Secondary | ICD-10-CM | POA: Insufficient documentation

## 2011-05-27 LAB — COMPLETE METABOLIC PANEL WITH GFR
ALT: 28 U/L (ref 0–35)
AST: 24 U/L (ref 0–37)
Albumin: 5 g/dL (ref 3.5–5.2)
Alkaline Phosphatase: 77 U/L (ref 39–117)
BUN: 19 mg/dL (ref 6–23)
CO2: 25 mEq/L (ref 19–32)
Calcium: 10.1 mg/dL (ref 8.4–10.5)
Chloride: 99 mEq/L (ref 96–112)
Creat: 0.7 mg/dL (ref 0.50–1.10)
GFR, Est African American: >89 mL/min
GFR, Est Non African American: >89 mL/min
Glucose, Bld: 105 mg/dL — ABNORMAL HIGH (ref 70–99)
Potassium: 4.2 mEq/L (ref 3.5–5.3)
Sodium: 140 mEq/L (ref 135–145)
Total Bilirubin: 0.8 mg/dL (ref 0.3–1.2)
Total Protein: 7.1 g/dL (ref 6.0–8.3)

## 2011-05-27 LAB — HM MAMMOGRAPHY: HM Mammogram: NORMAL

## 2011-05-27 MED ORDER — ALLOPURINOL 100 MG PO TABS: 100.0000 mg | ORAL_TABLET | Freq: Every day | ORAL | Status: DC

## 2011-05-27 MED ORDER — SIMVASTATIN 40 MG PO TABS: 40.0000 mg | ORAL_TABLET | Freq: Every day | ORAL | Status: DC

## 2011-05-27 NOTE — Assessment & Plan Note (Signed)
This is her second third gout attack in the last 12 months. Her flare has resolved and her uric acid level today is 8.1. Start allopurinol 100 mg daily for goal uric acid level of less than 6.0. I have advised her to take a nonsteroidal anti-inflammatory while initiating this medication as the changes in uric acid though can precipitate gout flare.

## 2011-05-27 NOTE — Assessment & Plan Note (Addendum)
Her control is excellent on metformin alone with hemoglobin A1c less than 6.0.  She started taking an ARB, a statin, and  baby aspirin daily.

## 2011-05-27 NOTE — Assessment & Plan Note (Signed)
She has an irritated hemorrhoid as a result of her recent diarrheal episode. It is nonthrombosed. I have recommended that she soak in the sitz bath to shrink the swelling and have prescribed Anusol HC to use twice daily as needed.

## 2011-05-27 NOTE — Assessment & Plan Note (Signed)
She currently takes simvastatin 40 mg daily for goal LDL of less than 70. She is due for fasting lipids today. Her LDL is at goal and no medication changes were made.

## 2011-05-28 ENCOUNTER — Telehealth: Payer: Self-pay | Admitting: Internal Medicine

## 2011-05-28 ENCOUNTER — Ambulatory Visit (INDEPENDENT_AMBULATORY_CARE_PROVIDER_SITE_OTHER): Payer: 59 | Admitting: Internal Medicine

## 2011-05-28 VITALS — BP 110/68 | HR 84 | Temp 100.0°F

## 2011-05-28 DIAGNOSIS — J111 Influenza due to unidentified influenza virus with other respiratory manifestations: Secondary | ICD-10-CM

## 2011-05-28 DIAGNOSIS — N390 Urinary tract infection, site not specified: Secondary | ICD-10-CM

## 2011-05-28 DIAGNOSIS — R509 Fever, unspecified: Secondary | ICD-10-CM

## 2011-05-28 LAB — POCT URINALYSIS DIPSTICK
Bilirubin, UA: NEGATIVE
Glucose, UA: NEGATIVE
Ketones, UA: NEGATIVE
Nitrite, UA: NEGATIVE
Spec Grav, UA: 1.015
Urobilinogen, UA: 2
pH, UA: 7.5

## 2011-05-28 LAB — POCT INFLUENZA A/B
Influenza A, POC: NEGATIVE
Influenza B, POC: NEGATIVE

## 2011-05-28 MED ORDER — CIPROFLOXACIN HCL 250 MG PO TABS: 250.0000 mg | ORAL_TABLET | Freq: Two times a day (BID) | ORAL | Status: AC

## 2011-05-28 MED ORDER — OSELTAMIVIR PHOSPHATE 75 MG PO CAPS: 75.0000 mg | ORAL_CAPSULE | Freq: Two times a day (BID) | ORAL | Status: AC

## 2011-05-28 MED ORDER — PROMETHAZINE HCL 25 MG PO TABS: 25.0000 mg | ORAL_TABLET | Freq: Three times a day (TID) | ORAL | Status: DC | PRN

## 2011-05-28 NOTE — Progress Notes (Signed)
Subjective:    Patient ID: Kimberly Huff, female    DOB: November 01, 1945, 66 y.o.   MRN: [id number]  HPI   Kimberly Huff is a 66 year old white female with a history of diabetes mellitus and hepatic steatosis/fatty liver who who was just seen 2 days ago for her three-month followup presents today with sudden onset of fevers chills and low back pain which started less than 24 hours ago. She feels the worst she has felt in years and wonders if she has the flu. She did have a flu shot over one month ago. She has noticed some fevers with rigors which started yesterday.  She has taken Advil for the symptoms and her last dose was this morning.   Past Medical History  Diagnosis Date  . Kidney stone   . Diverticulosis 2005    Dr Jamal Collin  . Coronary artery disease   . Hypertension   . Hyperlipidemia   . Type II or unspecified type diabetes mellitus without mention of complication, uncontrolled   . Hypopotassemia   . Atrophic kidney     right  . Osteoarthritis   . Coronary atherosclerosis   . Obesity   . Myocardial infarction 2000  . Recurrent UTI   . Steatohepatitis 05/30/10    Brunt Stage 3  . Diabetes mellitus   . Allergy   . Blood transfusion   . Cataract   . Clotting disorder     blood clots in abdomen after surgery  . Heart murmur   . Neuromuscular disorder     "achy muscles"   Current Outpatient Prescriptions on File Prior to Visit  Medication Sig Dispense Refill  . allopurinol (ZYLOPRIM) 100 MG tablet Take 1 tablet (100 mg total) by mouth daily.  30 tablet  6  . aspirin 81 MG tablet Take 81 mg by mouth daily.        . Cholecalciferol (VITAMIN D3) 2000 UNITS capsule Take 2,000 Units by mouth daily.        Marland Kitchen Fexofenadine HCl (ALLEGRA PO) Take 1 tablet by mouth daily as needed.       . furosemide (LASIX) 40 MG tablet Take 1 tablet (40 mg total) by mouth daily.  30 tablet  11  . hydrocortisone (ANUSOL-HC) 2.5 % rectal cream Place rectally 2 (two) times daily.  30 g  0  . losartan (COZAAR) 50  MG tablet Take 1 tablet (50 mg total) by mouth daily.  90 tablet  3  . metFORMIN (GLUCOPHAGE) 500 MG tablet Take 1 tablet (500 mg total) by mouth 2 (two) times daily with a meal.  60 tablet  2  . NEVANAC 0.1 % ophthalmic suspension       . omeprazole (PRILOSEC) 40 MG capsule Take 1 capsule (40 mg total) by mouth daily.  30 capsule  11  . oxyCODONE-acetaminophen (ROXICET) 5-325 MG per tablet Take 1 tablet by mouth every 8 (eight) hours as needed for pain.  30 tablet  0  . potassium chloride (KLOR-CON) 10 MEQ CR tablet Take 1 tablet (10 mEq total) by mouth daily.  30 tablet  11  . prednisoLONE acetate (PRED FORTE) 1 % ophthalmic suspension       . simvastatin (ZOCOR) 40 MG tablet Take 1 tablet (40 mg total) by mouth at bedtime.  90 tablet  3  . traMADol (ULTRAM) 50 MG tablet Take 1 tablet (50 mg total) by mouth every 6 (six) hours as needed.  60 tablet  2      Review  of Systems  Constitutional: Positive for fever and chills. Negative for unexpected weight change.  HENT: Negative for hearing loss, ear pain, nosebleeds, congestion, sore throat, facial swelling, rhinorrhea, sneezing, mouth sores, trouble swallowing, neck pain, neck stiffness, voice change, postnasal drip, sinus pressure, tinnitus and ear discharge.   Eyes: Negative for pain, discharge, redness and visual disturbance.  Respiratory: Negative for cough, chest tightness, shortness of breath, wheezing and stridor.   Cardiovascular: Negative for chest pain, palpitations and leg swelling.  Musculoskeletal: Positive for back pain. Negative for myalgias and arthralgias.  Skin: Negative for color change and rash.  Neurological: Positive for weakness and light-headedness. Negative for dizziness and headaches.  Hematological: Negative for adenopathy.       Objective:   Physical Exam  Constitutional: She is oriented to person, place, and time. She appears well-developed and well-nourished. She appears distressed.  HENT:  Mouth/Throat:  Oropharynx is clear and moist.  Eyes: EOM are normal. Pupils are equal, round, and reactive to light. No scleral icterus.  Neck: Normal range of motion. Neck supple. No JVD present. No thyromegaly present.  Cardiovascular: Normal rate, regular rhythm, normal heart sounds and intact distal pulses.   Pulmonary/Chest: Effort normal and breath sounds normal.  Abdominal: Soft. Bowel sounds are normal. She exhibits no mass. There is no tenderness.  Genitourinary: Vaginal discharge found.  Musculoskeletal: Normal range of motion. She exhibits no edema.  Lymphadenopathy:    She has no cervical adenopathy.  Neurological: She is alert and oriented to person, place, and time.  Skin: Skin is warm and dry.  Psychiatric: She has a normal mood and affect.          Assessment & Plan:   Influenza Despite the negative influenza rapid influenza test we noted there is a 30% false-negative rate. She has symptoms and signs consistent with influenza and is within the window period for use of Tamiflu. We'll prescribe Tamiflu along with Cipro for presumed urinary tract infection. Continue ibuprofen for relief of myalgias and avoiding Tylenol due to the history of fatty liver.    Updated Medication List Outpatient Encounter Prescriptions as of 05/28/2011  Medication Sig Dispense Refill  . allopurinol (ZYLOPRIM) 100 MG tablet Take 1 tablet (100 mg total) by mouth daily.  30 tablet  6  . aspirin 81 MG tablet Take 81 mg by mouth daily.        . Cholecalciferol (VITAMIN D3) 2000 UNITS capsule Take 2,000 Units by mouth daily.        . ciprofloxacin (CIPRO) 250 MG tablet Take 1 tablet (250 mg total) by mouth 2 (two) times daily.  14 tablet  7  . Fexofenadine HCl (ALLEGRA PO) Take 1 tablet by mouth daily as needed.       . furosemide (LASIX) 40 MG tablet Take 1 tablet (40 mg total) by mouth daily.  30 tablet  11  . hydrocortisone (ANUSOL-HC) 2.5 % rectal cream Place rectally 2 (two) times daily.  30 g  0  .  losartan (COZAAR) 50 MG tablet Take 1 tablet (50 mg total) by mouth daily.  90 tablet  3  . metFORMIN (GLUCOPHAGE) 500 MG tablet Take 1 tablet (500 mg total) by mouth 2 (two) times daily with a meal.  60 tablet  2  . NEVANAC 0.1 % ophthalmic suspension       . omeprazole (PRILOSEC) 40 MG capsule Take 1 capsule (40 mg total) by mouth daily.  30 capsule  11  . oseltamivir (TAMIFLU) 75 MG  capsule Take 1 capsule (75 mg total) by mouth 2 (two) times daily.  10 capsule  0  . oxyCODONE-acetaminophen (ROXICET) 5-325 MG per tablet Take 1 tablet by mouth every 8 (eight) hours as needed for pain.  30 tablet  0  . potassium chloride (KLOR-CON) 10 MEQ CR tablet Take 1 tablet (10 mEq total) by mouth daily.  30 tablet  11  . prednisoLONE acetate (PRED FORTE) 1 % ophthalmic suspension       . promethazine (PHENERGAN) 25 MG tablet Take 1 tablet (25 mg total) by mouth every 8 (eight) hours as needed for nausea.  20 tablet  0  . simvastatin (ZOCOR) 40 MG tablet Take 1 tablet (40 mg total) by mouth at bedtime.  90 tablet  3  . traMADol (ULTRAM) 50 MG tablet Take 1 tablet (50 mg total) by mouth every 6 (six) hours as needed.  60 tablet  2

## 2011-05-28 NOTE — Telephone Encounter (Signed)
Patient came in and saw Dr. Derrel Nip this a.m.

## 2011-05-29 ENCOUNTER — Telehealth: Payer: Self-pay | Admitting: *Deleted

## 2011-05-29 DIAGNOSIS — J111 Influenza due to unidentified influenza virus with other respiratory manifestations: Secondary | ICD-10-CM | POA: Insufficient documentation

## 2011-05-29 NOTE — Patient Instructions (Signed)
I am treating you for the flu and for a UTI.

## 2011-05-29 NOTE — Telephone Encounter (Signed)
Advised pt

## 2011-05-29 NOTE — Assessment & Plan Note (Signed)
Despite the negative influenza rapid influenza test we noted there is a 30% false-negative rate. She has symptoms and signs consistent with influenza and is within the window period for use of Tamiflu. We'll prescribe Tamiflu along with Cipro for presumed urinary tract infection. Continue ibuprofen for relief of myalgias and avoiding Tylenol due to the history of fatty liver.

## 2011-05-29 NOTE — Telephone Encounter (Signed)
Advised pt of lab results.  She doesn't know if she should take the allopurinol because of her liver disease and other health problems.  She would like your opinion on that.

## 2011-05-29 NOTE — Telephone Encounter (Signed)
Yes, she can take the allopurimol.  It si avery low dose and there is no C/I with her liver issues

## 2011-05-31 LAB — URINE CULTURE: Colony Count: >=100000

## 2011-07-03 ENCOUNTER — Encounter: Payer: Self-pay | Admitting: Internal Medicine

## 2011-07-03 ENCOUNTER — Ambulatory Visit (INDEPENDENT_AMBULATORY_CARE_PROVIDER_SITE_OTHER): Payer: 59 | Admitting: Internal Medicine

## 2011-07-03 ENCOUNTER — Telehealth: Payer: Self-pay | Admitting: Internal Medicine

## 2011-07-03 VITALS — BP 120/84 | HR 80 | Temp 97.9°F

## 2011-07-03 DIAGNOSIS — N390 Urinary tract infection, site not specified: Secondary | ICD-10-CM

## 2011-07-03 LAB — POCT URINALYSIS DIPSTICK
Bilirubin, UA: NEGATIVE
Glucose, UA: NEGATIVE
Ketones, UA: NEGATIVE
Nitrite, UA: POSITIVE
Spec Grav, UA: 1.015
Urobilinogen, UA: 1
pH, UA: 7.5

## 2011-07-03 MED ORDER — SULFAMETHOXAZOLE-TRIMETHOPRIM 800-160 MG PO TABS: 1.0000 | ORAL_TABLET | Freq: Two times a day (BID) | ORAL | Status: AC

## 2011-07-03 MED ORDER — PHENAZOPYRIDINE HCL 200 MG PO TABS: 200.0000 mg | ORAL_TABLET | Freq: Three times a day (TID) | ORAL | Status: AC | PRN

## 2011-07-03 NOTE — Telephone Encounter (Signed)
Patient scheduled with Dr. Derrel Nip today

## 2011-07-03 NOTE — Telephone Encounter (Signed)
[facility] Triage Call Report Triage Record Num: [id number] Operator: Lebron Conners Patient Name: Kimberly Huff Call Date & Time: 07/03/2011 9:54:50AM Patient Phone: [phone] PCP: Deborra Medina Patient Gender: Female PCP Fax : [phone] Patient DOB: [birth year] Practice Name: [facility] Reason for Call: Caller: [name]/Patient; PCP: Deborra Medina; CB#: [phone]; Calling today 07/03/11 regarding have urinary symptoms x1 week, hurts when she urinates. Emergent symptoms r/o by Urinary Symptoms Female guidelines with exception of has one or more urinary symptoms and has not been previously evaluated. Care advice given. Appt scheduled for today 07/03/11 at 3:15 PM with Dr. Derrel Nip. Protocol(s) Used: Urinary Symptoms - Female Recommended Outcome per Protocol: See Provider within 24 hours Reason for Outcome: Has one or more urinary tract symptoms AND has not been previously evaluated Care Advice: ~ Tell provider medical history of renal disease; especially if have only one kidney. ~ Call provider if you develop flank or low back pain, fever, generally feel sick. Increase intake of fluids. Try to drink 8 oz. (.2 liter) every hour when awake, including unsweetened cranberry juice, unless on restricted fluids for other medical reasons. Take sips of fluid or eat ice chips if nauseated or vomiting. ~ ~ Tell your provider if you are taking Warfarin (Coumadin) and drinking cranberry juice or taking cranberry capsules. ~ SYMPTOM / CONDITION MANAGEMENT ~ CAUTIONS Limit carbonated, alcoholic, and caffeinated beverages such as coffee, tea and soda. Avoid nonprescription cold and allergy medications that contain caffeine. Limit intake of tomatoes, fruit juices (except for unsweetened cranberry juice), dairy products, spicy foods, sugar, and artificial sweeteners (aspartame or saccharine). Stop or decrease smoking. Reducing exposure to bladder irritants may help lessen  urgency. ~ Analgesic/Antipyretic Advice - Acetaminophen: Consider acetaminophen as directed on label or by pharmacist/provider for pain or fever PRECAUTIONS: - Use if there is no history of liver disease, alcoholism, or intake of three or more alcohol drinks per day - Only if approved by provider during pregnancy or when breastfeeding - During pregnancy, acetaminophen should not be taken more than 3 consecutive days without telling provider - Do not exceed recommended dose or frequency ~ ~ Call provider if urine is pink, red, smoky or cola colored. Analgesic/Antipyretic Advice - NSAIDs: Consider aspirin, ibuprofen, naproxen or ketoprofen for pain or fever as directed on label or by pharmacist/provider. PRECAUTIONS: - If over 53 years of age, should not take longer than 1 week without consulting provider. EXCEPTIONS: - Should not be used if taking blood thinners or have bleeding problems. - Do not use if have history of sensitivity/allergy to any of these medications; or history of cardiovascular, ulcer, kidney, liver disease or diabetes unless approved by provider. - Do not exceed recommended dose or frequency. ~ 07/03/2011 10:06:48AM Page 1 of 1 [id number]

## 2011-07-03 NOTE — Progress Notes (Signed)
Subjective:    Patient ID: Kimberly Huff, female    DOB: [birth year], 66 y.o.   MRN: [id number]  HPI  66 yr old white female with history of DM 2 presents with 2  weeks of dysuria.  No back pain,  nausea or fevers.   Review of Systems  Constitutional: Negative for fever, chills and unexpected weight change.  HENT: Negative for hearing loss, ear pain, nosebleeds, congestion, sore throat, facial swelling, rhinorrhea, sneezing, mouth sores, trouble swallowing, neck pain, neck stiffness, voice change, postnasal drip, sinus pressure, tinnitus and ear discharge.   Eyes: Negative for pain, discharge, redness and visual disturbance.  Respiratory: Negative for cough, chest tightness, shortness of breath, wheezing and stridor.   Cardiovascular: Negative for chest pain, palpitations and leg swelling.  Musculoskeletal: Negative for myalgias and arthralgias.  Skin: Negative for color change and rash.  Neurological: Negative for dizziness, weakness, light-headedness and headaches.  Hematological: Negative for adenopathy.          Objective:   Physical Exam        Assessment & Plan:   Updated Medication List Outpatient Encounter Prescriptions as of 07/03/2011  Medication Sig Dispense Refill  . allopurinol (ZYLOPRIM) 100 MG tablet Take 1 tablet (100 mg total) by mouth daily.  30 tablet  6  . aspirin 81 MG tablet Take 81 mg by mouth daily.        . Cholecalciferol (VITAMIN D3) 2000 UNITS capsule Take 2,000 Units by mouth daily.        Marland Kitchen Fexofenadine HCl (ALLEGRA PO) Take 1 tablet by mouth daily as needed.       . furosemide (LASIX) 40 MG tablet Take 1 tablet (40 mg total) by mouth daily.  30 tablet  11  . losartan (COZAAR) 50 MG tablet Take 1 tablet (50 mg total) by mouth daily.  90 tablet  3  . metFORMIN (GLUCOPHAGE) 500 MG tablet Take 1 tablet (500 mg total) by mouth 2 (two) times daily with a meal.  60 tablet  2  . NEVANAC 0.1 % ophthalmic suspension       . omeprazole (PRILOSEC) 40 MG  capsule Take 1 capsule (40 mg total) by mouth daily.  30 capsule  11  . phenazopyridine (PYRIDIUM) 200 MG tablet Take 1 tablet (200 mg total) by mouth 3 (three) times daily as needed for pain.  10 tablet  0  . potassium chloride (KLOR-CON) 10 MEQ CR tablet Take 1 tablet (10 mEq total) by mouth daily.  30 tablet  11  . prednisoLONE acetate (PRED FORTE) 1 % ophthalmic suspension       . simvastatin (ZOCOR) 40 MG tablet Take 1 tablet (40 mg total) by mouth at bedtime.  90 tablet  3  . sulfamethoxazole-trimethoprim (SEPTRA DS) 800-160 MG per tablet Take 1 tablet by mouth 2 (two) times daily.  14 tablet  0  . traMADol (ULTRAM) 50 MG tablet Take 1 tablet (50 mg total) by mouth every 6 (six) hours as needed.  60 tablet  2      Urinary tract infection With positive UA.  Will treat empirically pending urine culture.     Updated Medication List Outpatient Encounter Prescriptions as of 07/03/2011  Medication Sig Dispense Refill  . allopurinol (ZYLOPRIM) 100 MG tablet Take 1 tablet (100 mg total) by mouth daily.  30 tablet  6  . aspirin 81 MG tablet Take 81 mg by mouth daily.        . Cholecalciferol (VITAMIN D3) 2000  UNITS capsule Take 2,000 Units by mouth daily.        Marland Kitchen Fexofenadine HCl (ALLEGRA PO) Take 1 tablet by mouth daily as needed.       . furosemide (LASIX) 40 MG tablet Take 1 tablet (40 mg total) by mouth daily.  30 tablet  11  . losartan (COZAAR) 50 MG tablet Take 1 tablet (50 mg total) by mouth daily.  90 tablet  3  . metFORMIN (GLUCOPHAGE) 500 MG tablet Take 1 tablet (500 mg total) by mouth 2 (two) times daily with a meal.  60 tablet  2  . NEVANAC 0.1 % ophthalmic suspension       . omeprazole (PRILOSEC) 40 MG capsule Take 1 capsule (40 mg total) by mouth daily.  30 capsule  11  . phenazopyridine (PYRIDIUM) 200 MG tablet Take 1 tablet (200 mg total) by mouth 3 (three) times daily as needed for pain.  10 tablet  0  . potassium chloride (KLOR-CON) 10 MEQ CR tablet Take 1 tablet (10 mEq  total) by mouth daily.  30 tablet  11  . prednisoLONE acetate (PRED FORTE) 1 % ophthalmic suspension       . simvastatin (ZOCOR) 40 MG tablet Take 1 tablet (40 mg total) by mouth at bedtime.  90 tablet  3  . sulfamethoxazole-trimethoprim (SEPTRA DS) 800-160 MG per tablet Take 1 tablet by mouth 2 (two) times daily.  14 tablet  0  . traMADol (ULTRAM) 50 MG tablet Take 1 tablet (50 mg total) by mouth every 6 (six) hours as needed.  60 tablet  2

## 2011-07-05 ENCOUNTER — Encounter: Payer: Self-pay | Admitting: Internal Medicine

## 2011-07-05 DIAGNOSIS — N390 Urinary tract infection, site not specified: Secondary | ICD-10-CM | POA: Insufficient documentation

## 2011-07-05 NOTE — Assessment & Plan Note (Signed)
With positive UA.  Will treat empirically pending urine culture.

## 2011-07-06 LAB — URINE CULTURE: Colony Count: >=100000

## 2011-07-07 ENCOUNTER — Other Ambulatory Visit: Payer: Self-pay | Admitting: Orthopedic Surgery

## 2011-07-07 ENCOUNTER — Encounter: Payer: Self-pay | Admitting: Internal Medicine

## 2011-07-28 ENCOUNTER — Telehealth: Payer: Self-pay | Admitting: Internal Medicine

## 2011-07-28 MED ORDER — SULFAMETHOXAZOLE-TRIMETHOPRIM 800-160 MG PO TABS: 1.0000 | ORAL_TABLET | Freq: Two times a day (BID) | ORAL | Status: AC

## 2011-07-28 NOTE — Telephone Encounter (Signed)
Caller: [name]/Patient; Phone Number: [phone]; Message from caller: Pt is requesting a call, refuses any details of concern.

## 2011-07-28 NOTE — Telephone Encounter (Signed)
Please call patient back and see if she will talk to you.

## 2011-07-28 NOTE — Telephone Encounter (Signed)
I called patient she has a Bartholin's gland cyst and requested an antibiotic.  Rx for Septra has been called in per Dr. Derrel Nip.

## 2011-08-19 DIAGNOSIS — N189 Chronic kidney disease, unspecified: Secondary | ICD-10-CM | POA: Insufficient documentation

## 2011-08-25 ENCOUNTER — Other Ambulatory Visit (HOSPITAL_COMMUNITY)
Admission: RE | Admit: 2011-08-25 | Discharge: 2011-08-25 | Disposition: A | Discharge status: Home / Self Care | Payer: 59 | Source: Ambulatory Visit | Attending: Internal Medicine | Admitting: Internal Medicine

## 2011-08-25 ENCOUNTER — Encounter: Payer: Self-pay | Admitting: Internal Medicine

## 2011-08-25 ENCOUNTER — Ambulatory Visit (INDEPENDENT_AMBULATORY_CARE_PROVIDER_SITE_OTHER): Payer: 59 | Admitting: Internal Medicine

## 2011-08-25 VITALS — BP 128/70 | HR 69 | Temp 98.1°F | Resp 16 | Wt 190.5 lb

## 2011-08-25 DIAGNOSIS — Z124 Encounter for screening for malignant neoplasm of cervix: Secondary | ICD-10-CM

## 2011-08-25 DIAGNOSIS — Z01419 Encounter for gynecological examination (general) (routine) without abnormal findings: Secondary | ICD-10-CM | POA: Insufficient documentation

## 2011-08-25 DIAGNOSIS — M109 Gout, unspecified: Secondary | ICD-10-CM | POA: Insufficient documentation

## 2011-08-25 DIAGNOSIS — Z01411 Encounter for gynecological examination (general) (routine) with abnormal findings: Secondary | ICD-10-CM | POA: Insufficient documentation

## 2011-08-25 DIAGNOSIS — K7689 Other specified diseases of liver: Secondary | ICD-10-CM

## 2011-08-25 DIAGNOSIS — E1169 Type 2 diabetes mellitus with other specified complication: Secondary | ICD-10-CM

## 2011-08-25 DIAGNOSIS — E669 Obesity, unspecified: Secondary | ICD-10-CM

## 2011-08-25 DIAGNOSIS — E790 Hyperuricemia without signs of inflammatory arthritis and tophaceous disease: Secondary | ICD-10-CM

## 2011-08-25 DIAGNOSIS — E118 Type 2 diabetes mellitus with unspecified complications: Secondary | ICD-10-CM

## 2011-08-25 DIAGNOSIS — E785 Hyperlipidemia, unspecified: Secondary | ICD-10-CM

## 2011-08-25 DIAGNOSIS — Z0000 Encounter for general adult medical examination without abnormal findings: Secondary | ICD-10-CM

## 2011-08-25 DIAGNOSIS — K7581 Nonalcoholic steatohepatitis (NASH): Secondary | ICD-10-CM

## 2011-08-25 DIAGNOSIS — R9389 Abnormal findings on diagnostic imaging of other specified body structures: Secondary | ICD-10-CM

## 2011-08-25 LAB — HM PAP SMEAR

## 2011-08-25 MED ORDER — ALLOPURINOL 300 MG PO TABS: 300.0000 mg | ORAL_TABLET | Freq: Every day | ORAL | Status: DC

## 2011-08-25 NOTE — Assessment & Plan Note (Signed)
hgba1c is < 6.0 on metformin alone.

## 2011-08-25 NOTE — Assessment & Plan Note (Addendum)
Recent lipid panel excellent except for low HDL which remains low at 28.  Continue simvastatin

## 2011-08-25 NOTE — Assessment & Plan Note (Signed)
Normal lfts and hgba1c and lipid panel recenlty.

## 2011-08-25 NOTE — Assessment & Plan Note (Addendum)
She is s/p hysterectomy.  Pelvic exam was done today,  Vaginal was friable and a bit ulcerated on her left side.  Will refer to GYN for evaluation

## 2011-08-25 NOTE — Patient Instructions (Addendum)
Consider the Low Glycemic Index Diet and 6 smaller meals daily .  This boosts your metabolism and regulates your sugars:   7 AM Low carbohydrate Protein  Shakes (EAS Carb Control  Or Atkins ,  Available everywhere,   In  cases at [employer] )  2.5 carbs  (Add or substitute a toasted sandwhich thin w/ peanut butter)  10 AM: Protein bar by Atkins (snack size,  Chocolate lover's variety at  [employer])    Lunch: sandwich on pita bread or flatbread (Joseph's makes a pita bread and a flat bread , available at [employer] and [employer]; Toufayah makes a low carb flatbread available at [employer] and [employer]) Mission makes a low carb whole wheat tortilla available at [employer] most grocery stores   3 PM:  Mid day :  Another protein bar,  Or a  cheese stick, 1/4 cup of almonds, walnuts, pistachios, pecans, peanuts,  Macadamia nuts  6 PM  Dinner:  "mean and green:"  Meat/chicken/fish, salad, and green veggie : use ranch, vinagrette,  Blue cheese, etc  9 PM snack : Breyer's low carb fudgsicle or  ice cream bar (Carb Smart), or  Weight Watcher's ice cream bar , or another protein shake

## 2011-08-25 NOTE — Assessment & Plan Note (Signed)
I have addressed  BMI and recommended a low glycemic index diet utilizing smaller more frequent meals to increase metabolism.  I have also recommended that patient start exercising with a goal of 30 minutes of aerobic exercise a minimum of 5 days per week.  

## 2011-08-25 NOTE — Progress Notes (Signed)
Patient ID: Kimberly Huff, female   DOB: 04-Dec-1945, 66 y.o.   MRN: [id number]  The patient is here for annual Medicare wellness examination and management of other chronic and acute problem including diabetes, coronary artery disease and NASH. She recently returned from a 7 day cruise on April 8.  She feels fine,  Had no illnesses during the trip.  She does admit to overindulgence. She was seen by GI /[facility] for follow up on NASH last week .  Liver enzymes , lipids were normal.  Hgba1c wa < 7.0.  She has gained a few lbs but has not been able to follow a diet and is not exercising. She is up to date on mammogram but has not had a pelvic exam in several years.   The risk factors are reflected in the social history.  The roster of all physicians providing medical care to patient - is listed in the Snapshot section of the chart.  Activities of daily living:  The patient is 100% independent in all ADLs: dressing, toileting, feeding as well as independent mobility  Home safety : The patient has smoke detectors in the home. They wear seatbelts.  There are no firearms at home. There is no violence in the home.   There is no risks for hepatitis, STDs or HIV. There is no   history of blood transfusion. They have no travel history to infectious disease endemic areas of the world.  The patient has seen their dentist in the last six month. They have seen their eye doctor in the last year. They admit to slight hearing difficulty with regard to whispered voices and some television programs.  They have deferred audiologic testing in the last year.  They do not  have excessive sun exposure. Discussed the need for sun protection: hats, long sleeves and use of sunscreen if there is significant sun exposure.   Diet: the importance of a healthy diet is discussed. They do have a healthy diet.  The benefits of regular aerobic exercise were discussed. She does not exercise regularly.   Depression screen: there are no signs  or vegative symptoms of depression- irritability, change in appetite, anhedonia, sadness/tearfullness.  Cognitive assessment: the patient manages all their financial and personal affairs and is actively engaged. They could relate day,date,year and events; recalled 2/3 objects at 3 minutes; performed clock-face test normally.  The following portions of the patient's history were reviewed and updated as appropriate: allergies, current medications, past family history, past medical history,  past surgical history, past social history  and problem list.  Visual acuity was not assessed per patient preference since she has regular follow up with her ophthalmologist. Hearing and body mass index were assessed and reviewed.   During the course of the visit the patient was educated and counseled about appropriate screening and preventive services including : fall prevention , diabetes screening, nutrition counseling, colorectal cancer screening, and recommended immunizations.    BP 128/70  Pulse 69  Temp(Src) 98.1 F (36.7 C) (Oral)  Resp 16  Wt 190 lb 8 oz (86.41 kg)  SpO2 97%  General appearance: alert, cooperative and appears stated age Ears: normal TM's and external ear canals both ears Throat: lips, mucosa, and tongue normal; teeth and gums normal Neck: no adenopathy, no carotid bruit, supple, symmetrical, trachea midline and thyroid not enlarged, symmetric, no tenderness/mass/nodules Back: symmetric, no curvature. ROM normal. No CVA tenderness. Lungs: clear to auscultation bilaterally Heart: regular rate and rhythm, S1, S2 normal, no murmur, click,  rub or gallop Abdomen: soft, non-tender; bowel sounds normal; no masses,  no organomegaly GYN: vaginal vault isredundant , ulcerated on the right. Pulses: 2+ and symmetric Skin: Skin color, texture, turgor normal. No rashes or lesions Lymph nodes: Cervical, supraclavicular, and axillary nodes normal.  Gout Uric acid level is 7.0 on current  allopurinol dose  Will increase to 300 mg daily.    Abnormal pelvic exam She is s/p hysterectomy.  Pelvic exam was done today,  Vaginal was friable and a bit ulcerated on her left side.  Will refer to GYN for evaluation   Dyslipidemia associated with type 2 diabetes mellitus Recent lipid panel excellent except for low HDL which remains low at 28.  Continue simvastatin  Diabetes mellitus with complication [id number] is < 6.0 on metformin alone.   NASH (nonalcoholic steatohepatitis) Normal lfts and hgba1c and lipid panel recenlty.   Obesity (BMI 30-39.9) I have addressed  BMI and recommended a low glycemic index diet utilizing smaller more frequent meals to increase metabolism.  I have also recommended that patient start exercising with a goal of 30 minutes of aerobic exercise a minimum of 5 days per week.      Updated Medication List Outpatient Encounter Prescriptions as of 08/25/2011  Medication Sig Dispense Refill  . aspirin 81 MG tablet Take 81 mg by mouth daily.        . Cholecalciferol (VITAMIN D3) 2000 UNITS capsule Take 2,000 Units by mouth daily.        Marland Kitchen Fexofenadine HCl (ALLEGRA PO) Take 1 tablet by mouth daily as needed.       . furosemide (LASIX) 40 MG tablet Take 1 tablet (40 mg total) by mouth daily.  30 tablet  11  . ibuprofen (ADVIL,MOTRIN) 800 MG tablet Take 800 mg by mouth every 8 (eight) hours as needed.      Marland Kitchen losartan (COZAAR) 50 MG tablet Take 1 tablet (50 mg total) by mouth daily.  90 tablet  3  . metFORMIN (GLUCOPHAGE) 500 MG tablet Take 1 tablet (500 mg total) by mouth 2 (two) times daily with a meal.  60 tablet  2  . NEVANAC 0.1 % ophthalmic suspension       . omeprazole (PRILOSEC) 40 MG capsule Take 1 capsule (40 mg total) by mouth daily.  30 capsule  11  . potassium chloride (KLOR-CON) 10 MEQ CR tablet Take 1 tablet (10 mEq total) by mouth daily.  30 tablet  11  . prednisoLONE acetate (PRED FORTE) 1 % ophthalmic suspension       . simvastatin (ZOCOR) 40 MG  tablet Take 1 tablet (40 mg total) by mouth at bedtime.  90 tablet  3  . traMADol (ULTRAM) 50 MG tablet Take 1 tablet (50 mg total) by mouth every 6 (six) hours as needed.  60 tablet  2  . DISCONTD: allopurinol (ZYLOPRIM) 100 MG tablet Take 1 tablet (100 mg total) by mouth daily.  30 tablet  6  . allopurinol (ZYLOPRIM) 300 MG tablet Take 1 tablet (300 mg total) by mouth daily.  30 tablet  6

## 2011-08-25 NOTE — Assessment & Plan Note (Addendum)
Uric acid level is 7.0 on current allopurinol dose  Will increase to 300 mg daily.

## 2011-08-26 ENCOUNTER — Telehealth: Payer: Self-pay | Admitting: Internal Medicine

## 2011-08-26 DIAGNOSIS — Z01411 Encounter for gynecological examination (general) (routine) with abnormal findings: Secondary | ICD-10-CM

## 2011-08-26 NOTE — Telephone Encounter (Signed)
Caller: [name]/Patient; Phone Number: [phone]; Message from caller: Dr.  Derrel Nip was wanting pt to see gynecololgist; pt would like to see "Dr.  Keturah Barre" at [facility].  Would like for office Dr. Lupita Dawn office to schedule the appointment for Kimberly Huff and then call her with the date/time.

## 2011-08-26 NOTE — Telephone Encounter (Signed)
  Referral to Dr Enzo Bi, the gynecologist we discussed,  in process

## 2011-09-21 ENCOUNTER — Telehealth: Payer: Self-pay | Admitting: Internal Medicine

## 2011-09-21 DIAGNOSIS — M109 Gout, unspecified: Secondary | ICD-10-CM

## 2011-09-21 MED ORDER — PREDNISONE (PAK) 10 MG PO TABS: ORAL_TABLET | ORAL | Status: AC

## 2011-09-21 NOTE — Telephone Encounter (Signed)
Caller: [name]/Patient; PCP: Deborra Medina; CB#: [phone]; Call regarding Gout; L great toe pain with mild swelling onset 09/19/11. Ibuprofen 887m brings some relief. Walking with a mild limp. Pt ? if she can get an for steroids as this worked well with her last gout flairup. Diabetes: Foot Problems Protocol.

## 2011-09-21 NOTE — Telephone Encounter (Signed)
Patient notified as instructed by telephone.

## 2011-09-21 NOTE — Telephone Encounter (Signed)
rx for prednisone taper sent to her pharmacy,  May start tomorrow.

## 2011-09-23 LAB — HM DIABETES EYE EXAM

## 2011-11-25 ENCOUNTER — Ambulatory Visit: Payer: 59 | Admitting: Internal Medicine

## 2011-12-14 ENCOUNTER — Other Ambulatory Visit: Payer: Self-pay | Admitting: Internal Medicine

## 2011-12-25 ENCOUNTER — Encounter: Payer: Self-pay | Admitting: Internal Medicine

## 2011-12-25 ENCOUNTER — Ambulatory Visit (INDEPENDENT_AMBULATORY_CARE_PROVIDER_SITE_OTHER): Payer: 59 | Admitting: Internal Medicine

## 2011-12-25 VITALS — BP 118/78 | HR 88 | Temp 98.8°F | Resp 16 | Wt 189.5 lb

## 2011-12-25 DIAGNOSIS — E785 Hyperlipidemia, unspecified: Secondary | ICD-10-CM

## 2011-12-25 DIAGNOSIS — K7689 Other specified diseases of liver: Secondary | ICD-10-CM

## 2011-12-25 DIAGNOSIS — K7581 Nonalcoholic steatohepatitis (NASH): Secondary | ICD-10-CM

## 2011-12-25 DIAGNOSIS — E1169 Type 2 diabetes mellitus with other specified complication: Secondary | ICD-10-CM

## 2011-12-25 DIAGNOSIS — N390 Urinary tract infection, site not specified: Secondary | ICD-10-CM

## 2011-12-25 DIAGNOSIS — E118 Type 2 diabetes mellitus with unspecified complications: Secondary | ICD-10-CM

## 2011-12-25 LAB — POCT URINALYSIS DIPSTICK
Bilirubin, UA: NEGATIVE
Blood, UA: NEGATIVE
Glucose, UA: NEGATIVE
Ketones, UA: NEGATIVE
Nitrite, UA: NEGATIVE
Protein, UA: NEGATIVE
Spec Grav, UA: 1.01
Urobilinogen, UA: 0.2
pH, UA: 6

## 2011-12-25 MED ORDER — CIPROFLOXACIN HCL 250 MG PO TABS: 250.0000 mg | ORAL_TABLET | Freq: Two times a day (BID) | ORAL | Status: AC

## 2011-12-26 ENCOUNTER — Encounter: Payer: Self-pay | Admitting: Internal Medicine

## 2011-12-26 NOTE — Assessment & Plan Note (Signed)
Managed with control of diabetes and hyperlipidemia.  Follow u with Dr. Gerald Dexter at [facility] next [place]

## 2011-12-26 NOTE — Assessment & Plan Note (Signed)
Well controlled on current regimen. She is due for repeat labs. Marland Kitchen

## 2011-12-26 NOTE — Progress Notes (Signed)
Patient ID: Kimberly Huff, female   DOB: [birth year], 66 y.o.   MRN: [id number]  Subjective:    Kimberly Huff is a 66 y.o. female who complains of abnormal smelling urine and burning with urination. She has had symptoms for 5 days. Patient also complains of no other symptoms. Patient denies back pain, fever, stomach ache and vaginal discharge. Patient does not have a history of recurrent UTI. Patient does not have a history of pyelonephritis.   The following portions of the patient's history were reviewed and updated as appropriate: allergies, current medications, past family history, past medical history, past social history, past surgical history and problem list.  Review of Systems A comprehensive review of systems was negative except for: Genitourinary: positive for  and dysuria Behavioral/Psych: positive for obesity    Objective:    BP 118/78  Pulse 88  Temp 98.8 F (37.1 C) (Oral)  Resp 16  Wt 189 lb 8 oz (85.957 kg)  SpO2 96%  General Appearance:    Alert, cooperative, no distress, appears stated age  Head:    Normocephalic, without obvious abnormality, atraumatic  Eyes:    PERRL, conjunctiva/corneas clear, EOM's intact  Ears:    Normal TM's and external ear canals, both ears  Nose:   Nares normal, septum midline, mucosa normal, no drainage    or sinus tenderness  Throat:   Lips, mucosa, and tongue normal; teeth and gums normal  Neck:   Supple, symmetrical, trachea midline, no adenopathy;    thyroid:  no enlargement/tenderness/nodules; no carotid   bruit or JVD  Back:     Symmetric, no curvature, ROM normal, no CVA tenderness  Lungs:     Clear to auscultation bilaterally, respirations unlabored  Chest Wall:    No tenderness or deformity   Heart:    Regular rate and rhythm, S1 and S2 normal, no murmur, rub   or gallop     Abdomen:     Soft, non-tender, bowel sounds active all four quadrants,    no masses, no organomegaly        Extremities:   Extremities normal, atraumatic, no  cyanosis or edema  Pulses:   2+ and symmetric all extremities  Skin:   Skin color, texture, turgor normal, no rashes or lesions  Lymph nodes:   Cervical, supraclavicular, and axillary nodes normal  Neurologic:   CNII-XII intact, normal strength, sensation and reflexes    Throughout Foot exam:  Nails are well trimmed,  No callouses,  Sensation intact to microfilament     Laboratory:  Urine dipstick: trace for leukocyte esterase.   Micro exam: pending.    Assessment:    Acute cystitis and UTI     Plan:    Medications: ciprofloxacin.  NASH (nonalcoholic steatohepatitis) Managed with control of diabetes and hyperlipidemia.  Follow u with Dr. Gerald Huff at [facility] [place]  Diabetes mellitus with complication Well controlled on current regimen. Renal function stable, no changes today.  Repeat labs due.  Foot exam is normal .  She is up to date with annual eye exams.   Dyslipidemia associated with type 2 diabetes mellitus Well controlled on current regimen. She is due for repeat labs. Marland Kitchen   Updated Medication List Outpatient Encounter Prescriptions as of 12/25/2011  Medication Sig Dispense Refill  . allopurinol (ZYLOPRIM) 300 MG tablet Take 1 tablet (300 mg total) by mouth daily.  30 tablet  6  . aspirin 81 MG tablet Take 81 mg by mouth daily.        Marland Kitchen  furosemide (LASIX) 40 MG tablet Take 1 tablet (40 mg total) by mouth daily.  30 tablet  11  . ibuprofen (ADVIL,MOTRIN) 800 MG tablet Take 800 mg by mouth every 8 (eight) hours as needed.      Marland Kitchen KLOR-CON 10 10 MEQ tablet TAKE 1 TABLET BY MOUTH EVERY DAY  90 tablet  3  . losartan (COZAAR) 50 MG tablet Take 1 tablet (50 mg total) by mouth daily.  90 tablet  3  . metFORMIN (GLUCOPHAGE) 500 MG tablet Take 1 tablet (500 mg total) by mouth 2 (two) times daily with a meal.  60 tablet  2  . omeprazole (PRILOSEC) 40 MG capsule Take 1 capsule (40 mg total) by mouth daily.  30 capsule  11  . simvastatin (ZOCOR) 40 MG tablet Take 1 tablet (40 mg total) by  mouth at bedtime.  90 tablet  3  . ciprofloxacin (CIPRO) 250 MG tablet Take 1 tablet (250 mg total) by mouth 2 (two) times daily.  10 tablet  0  . DISCONTD: Cholecalciferol (VITAMIN D3) 2000 UNITS capsule Take 2,000 Units by mouth daily.        Marland Kitchen DISCONTD: Fexofenadine HCl (ALLEGRA PO) Take 1 tablet by mouth daily as needed.       Marland Kitchen DISCONTD: NEVANAC 0.1 % ophthalmic suspension       . DISCONTD: prednisoLONE acetate (PRED FORTE) 1 % ophthalmic suspension       . DISCONTD: traMADol (ULTRAM) 50 MG tablet Take 1 tablet (50 mg total) by mouth every 6 (six) hours as needed.  60 tablet  2

## 2011-12-26 NOTE — Assessment & Plan Note (Signed)
Well controlled on current regimen. Renal function stable, no changes today.  Repeat labs due.  Foot exam is normal .  She is up to date with annual eye exams.

## 2011-12-28 LAB — URINE CULTURE: Colony Count: 75000

## 2011-12-29 ENCOUNTER — Other Ambulatory Visit (INDEPENDENT_AMBULATORY_CARE_PROVIDER_SITE_OTHER): Payer: 59

## 2011-12-29 DIAGNOSIS — N390 Urinary tract infection, site not specified: Secondary | ICD-10-CM

## 2011-12-29 LAB — COMPREHENSIVE METABOLIC PANEL
ALT: 39 U/L — ABNORMAL HIGH (ref 0–35)
AST: 30 U/L (ref 0–37)
Albumin: 4.1 g/dL (ref 3.5–5.2)
Alkaline Phosphatase: 74 U/L (ref 39–117)
BUN: 15 mg/dL (ref 6–23)
CO2: 30 mEq/L (ref 19–32)
Calcium: 9.5 mg/dL (ref 8.4–10.5)
Chloride: 104 mEq/L (ref 96–112)
Creatinine, Ser: 0.6 mg/dL (ref 0.4–1.2)
GFR: 105.56 mL/min (ref >60.00)
Glucose, Bld: 106 mg/dL — ABNORMAL HIGH (ref 70–99)
Potassium: 4.7 mEq/L (ref 3.5–5.1)
Sodium: 140 mEq/L (ref 135–145)
Total Bilirubin: 0.6 mg/dL (ref 0.3–1.2)
Total Protein: 6.6 g/dL (ref 6.0–8.3)

## 2011-12-29 LAB — LIPID PANEL
Cholesterol: 96 mg/dL (ref 0–200)
HDL: 26.2 mg/dL — ABNORMAL LOW (ref >39.00)
LDL Cholesterol: 35 mg/dL (ref 0–99)
Total CHOL/HDL Ratio: 4
Triglycerides: 174 mg/dL — ABNORMAL HIGH (ref 0.0–149.0)
VLDL: 34.8 mg/dL (ref 0.0–40.0)

## 2011-12-29 LAB — HEMOGLOBIN A1C: Hgb A1c MFr Bld: 5.6 % (ref 4.6–6.5)

## 2012-01-01 ENCOUNTER — Telehealth: Payer: Self-pay | Admitting: Internal Medicine

## 2012-01-01 NOTE — Telephone Encounter (Signed)
Patient notified

## 2012-01-01 NOTE — Telephone Encounter (Signed)
Pt called checking on her lab results   Labs were done Tuesday am

## 2012-01-01 NOTE — Telephone Encounter (Signed)
hgba1c is excellent  And cholesterol has improved.  i sent her a message via  mychart but apparently nothing went through that day

## 2012-01-15 ENCOUNTER — Other Ambulatory Visit: Payer: Self-pay | Admitting: Internal Medicine

## 2012-01-18 ENCOUNTER — Other Ambulatory Visit: Payer: Self-pay | Admitting: Internal Medicine

## 2012-02-04 ENCOUNTER — Emergency Department: Payer: Self-pay | Admitting: Emergency Medicine

## 2012-02-05 ENCOUNTER — Telehealth: Payer: Self-pay | Admitting: Internal Medicine

## 2012-02-05 NOTE — Telephone Encounter (Signed)
Pt called she wanted to know if dr Derrel Nip can refer her to a othropedic dr today She fell Wednesday night and went to the er.  Er told her to follow with ortho thur or Friday [facility] (dr Joie Bimler) called her with appointment today then call her back and told her she didn't need to come next Friday. Pt broke her humerus bone in arm. They have her arm velcro to her chest.  And messed up her knee it is swollen and has rug burn Please advise pt

## 2012-02-05 NOTE — Telephone Encounter (Signed)
Pt called she has an appointment with dr Marga Hoots this morning with [facility] clinic She will call back Monday if she wants another referrel Please disregard this note

## 2012-02-12 ENCOUNTER — Telehealth: Payer: Self-pay | Admitting: Internal Medicine

## 2012-02-12 NOTE — Telephone Encounter (Signed)
Caller: [name]/Patient; Patient Name: Kimberly Huff; PCP: Deborra Medina (Adults only); Best Callback Phone Number: [phone] Patient is calling about patient is refusing triage.  Reports that she really just wants to speak with Dr. Derrel Nip, has questions regarding possible surgery for fractured humerus.  Patient is currently under care of an orthopedic physician but wants to speak with Dr. Derrel Nip about possibility of surgery.   Advised patient that this nurse would send note to office for Dr. Derrel Nip to call patient back @ [phone] regarding questions per Genice Rouge, RN.

## 2012-02-16 ENCOUNTER — Encounter: Payer: Self-pay | Admitting: Internal Medicine

## 2012-02-16 ENCOUNTER — Ambulatory Visit (INDEPENDENT_AMBULATORY_CARE_PROVIDER_SITE_OTHER): Payer: 59 | Admitting: Internal Medicine

## 2012-02-16 VITALS — BP 132/74 | HR 70 | Temp 98.4°F

## 2012-02-16 DIAGNOSIS — K7689 Other specified diseases of liver: Secondary | ICD-10-CM

## 2012-02-16 DIAGNOSIS — E118 Type 2 diabetes mellitus with unspecified complications: Secondary | ICD-10-CM

## 2012-02-16 DIAGNOSIS — S42309A Unspecified fracture of shaft of humerus, unspecified arm, initial encounter for closed fracture: Secondary | ICD-10-CM

## 2012-02-16 DIAGNOSIS — E669 Obesity, unspecified: Secondary | ICD-10-CM

## 2012-02-16 DIAGNOSIS — K7581 Nonalcoholic steatohepatitis (NASH): Secondary | ICD-10-CM

## 2012-02-16 DIAGNOSIS — B3789 Other sites of candidiasis: Secondary | ICD-10-CM

## 2012-02-16 DIAGNOSIS — Z8781 Personal history of (healed) traumatic fracture: Secondary | ICD-10-CM | POA: Insufficient documentation

## 2012-02-16 MED ORDER — IBUPROFEN 800 MG PO TABS: 800.0000 mg | ORAL_TABLET | Freq: Three times a day (TID) | ORAL | Status: DC | PRN

## 2012-02-16 MED ORDER — NYSTATIN 100000 UNIT/GM EX CREA: TOPICAL_CREAM | Freq: Two times a day (BID) | CUTANEOUS | Status: DC

## 2012-02-16 NOTE — Progress Notes (Signed)
Patient ID: Kimberly Huff, female   DOB: 12-31-1945, 66 y.o.   MRN: [id number]  Patient Active Problem List  Diagnosis  . Obesity (BMI 30-39.9)  . Dyslipidemia associated with type 2 diabetes mellitus  . Cataract extraction status  . Hydronephrosis, left  . NASH (nonalcoholic steatohepatitis)  . Diabetes mellitus with complication  . Hemorrhoid  . Gout  . Humeral shaft fracture  . Candida rash of groin    Subjective:  CC:   Chief Complaint  Patient presents with  . Fall    HPI:   Kimberly Huff a 66 y.o. female who presents with Right arm pain. Patient had a dramatic humeral fracture which occurred last week when she fell at home after tripping over a basket she left the floor. She was seen in the emergency room and noted to have a humeral fracture in 2 places. X-rays of the foot were also done which were normal. [facility] and was seen by Dr. Nanda Quinton.  She was also treated for a knee sprain with physical therapy which is ordered for this afternoon. She has extensive ecchymosis of the thigh and has been in considerable pain. She has been advised to have surgery but there has been some question as to whether she is fit for surgery given her multiple medical problems. Ms. Wedekind although she has multiple medical problems is physically very active and currently using a personal trainer to improve her exercise endurance. She exercises at least 3 times a week. She is worried that if she does not have surgery that the decreased range of motion that she can expect from her right shoulder will significantly impact her ability to care for herself. She is requesting to have a conversation with Dr. Loreen Freud regarding her preoperative condition.  Although she has multiple medical conditions including diabetes hypertension hyperlipidemia and fatty liver her conditions are all well controlled. She has not insulin-dependent. She has normal renal function. She  has a history of coronary artery disease but denies any history in the recent months of chest pain or dyspnea/orthopnea..    Past Medical History  Diagnosis Date  . Kidney stone   . Diverticulosis 2005    Dr Jamal Collin  . Coronary artery disease   . Hypertension   . Hyperlipidemia   . Type II or unspecified type diabetes mellitus without mention of complication, uncontrolled   . Hypopotassemia   . Atrophic kidney     right  . Osteoarthritis   . Coronary atherosclerosis   . Obesity   . Myocardial infarction 2000  . Recurrent UTI   . Steatohepatitis 05/30/10    Brunt Stage 3  . Diabetes mellitus   . Allergy   . Blood transfusion   . Cataract   . Clotting disorder     blood clots in abdomen after surgery  . Heart murmur   . Neuromuscular disorder     "achy muscles"    Past Surgical History  Procedure Date  . Breast mass excision     benign  . Ureterotomy 1960/1969    x 2 during childhood  . Abdominal hysterectomy 1982    complete  . Ptca 2000  . Vaginal hysterectomy     PT DENIES ABD HYSTERECTOMY         The following portions of the patient's history were reviewed and updated as appropriate: Allergies, current medications, and problem list.    Review of Systems:   12 Pt  review of  systems was negative except those addressed in the HPI,     History   Social History  . Marital Status: Single    Spouse Name: N/A    Number of Children: 2  . Years of Education: N/A   Occupational History  . Account executive    Social History Main Topics  . Smoking status: Former Smoker    Types: Cigarettes    Quit date: 09/15/1998  . Smokeless tobacco: Never Used  . Alcohol Use: Yes     very rare  . Drug Use: No  . Sexually Active: Not on file   Other Topics Concern  . Not on file   Social History Narrative  . No narrative on file    Objective:  BP 132/74  Pulse 70  Temp 98.4 F (36.9 C) (Oral)  SpO2 97%  General appearance: alert, cooperative and  appears stated age Ears: normal TM's and external ear canals both ears Throat: lips, mucosa, and tongue normal; teeth and gums normal Neck: no adenopathy, no carotid bruit, supple, symmetrical, trachea midline and thyroid not enlarged, symmetric, no tenderness/mass/nodules Back: symmetric, no curvature. ROM normal. No CVA tenderness. Lungs: clear to auscultation bilaterally Heart: regular rate and rhythm, S1, S2 normal, no murmur, click, rub or gallop Abdomen: soft, non-tender; bowel sounds normal; no masses,  no organomegaly Pulses: 2+ and symmetric Skin: Skin color, texture, turgor normal. No rashes or lesions Lymph nodes: Cervical, supraclavicular, and axillary nodes normal.  Assessment and Plan:  Obesity (BMI 30-39.9) Patient is now following a low glycemic index diet and exercising regularly up until the time of her humeral fracture.  NASH (nonalcoholic steatohepatitis) Managed with control of diabetes weight and hyperlipidemia. Followup at [facility] on a regular basis. Last liver enzyme test was normal.  Diabetes mellitus with complication Her diabetes remains well controlled she does not require insulin.  Candida rash of groin Nystatin cream has been prescribed for left inguinal rash.  Humeral shaft fracture Because of her medical history she is at moderate risk for perioperative complications. Despite her multiple medical problems she is very functional and very motivated to have surgery. I will speak with Dr. Christin Fudge about proceeding with surgery. She has seen her cardiologist as recently as last month, Dr. Clayborn Bigness   Updated Medication List Outpatient Encounter Prescriptions as of 02/16/2012  Medication Sig Dispense Refill  . allopurinol (ZYLOPRIM) 300 MG tablet Take 1 tablet (300 mg total) by mouth daily.  30 tablet  6  . aspirin 81 MG tablet Take 81 mg by mouth daily.        . furosemide (LASIX) 40 MG tablet TAKE 1 TABLET BY MOUTH EVERY DAY  90 tablet  3  . ibuprofen  (ADVIL,MOTRIN) 800 MG tablet Take 1 tablet (800 mg total) by mouth every 8 (eight) hours as needed.  60 tablet  2  . KLOR-CON 10 10 MEQ tablet TAKE 1 TABLET BY MOUTH EVERY DAY  90 tablet  3  . losartan (COZAAR) 50 MG tablet Take 1 tablet (50 mg total) by mouth daily.  90 tablet  3  . metFORMIN (GLUCOPHAGE) 500 MG tablet TAKE ONE TABLET BY MOUTH TWICE DAILY  180 tablet  3  . omeprazole (PRILOSEC) 40 MG capsule Take 1 capsule (40 mg total) by mouth daily.  30 capsule  11  . oxyCODONE (OXY IR/ROXICODONE) 5 MG immediate release tablet Take 5 mg by mouth every 6 (six) hours as needed.      . simvastatin (ZOCOR) 40 MG tablet  Take 1 tablet (40 mg total) by mouth at bedtime.  90 tablet  3  . DISCONTD: ibuprofen (ADVIL,MOTRIN) 800 MG tablet Take 800 mg by mouth every 8 (eight) hours as needed.      . nystatin cream (MYCOSTATIN) Apply topically 2 (two) times daily.  30 g  0  . DISCONTD: ibuprofen (ADVIL,MOTRIN) 800 MG tablet Take 1 tablet (800 mg total) by mouth every 8 (eight) hours as needed for pain.  90 tablet  1     No orders of the defined types were placed in this encounter.    No Follow-up on file.

## 2012-02-16 NOTE — Patient Instructions (Signed)
I will talk to Dr. Joie Bimler about your surgery.

## 2012-02-17 ENCOUNTER — Encounter: Payer: Self-pay | Admitting: Internal Medicine

## 2012-02-17 NOTE — Assessment & Plan Note (Addendum)
Because of her medical history she is at moderate risk for perioperative complications. Despite her multiple medical problems she is very functional and very motivated to have surgery. I will speak with Dr. Christin Fudge about proceeding with surgery. She has seen her cardiologist as recently as last month, Dr. Clayborn Bigness

## 2012-02-17 NOTE — Assessment & Plan Note (Signed)
Patient is now following a low glycemic index diet and exercising regularly up until the time of her humeral fracture.

## 2012-02-17 NOTE — Assessment & Plan Note (Signed)
Managed with control of diabetes weight and hyperlipidemia. Followup at [facility] on a regular basis. Last liver enzyme test was normal.

## 2012-02-17 NOTE — Assessment & Plan Note (Signed)
Nystatin cream has been prescribed for left inguinal rash.

## 2012-02-17 NOTE — Assessment & Plan Note (Signed)
Her diabetes remains well controlled she does not require insulin.

## 2012-03-28 ENCOUNTER — Other Ambulatory Visit: Payer: Self-pay | Admitting: *Deleted

## 2012-03-28 ENCOUNTER — Other Ambulatory Visit: Payer: Self-pay | Admitting: Internal Medicine

## 2012-03-28 NOTE — Telephone Encounter (Signed)
Opened in error

## 2012-05-04 DIAGNOSIS — I10 Essential (primary) hypertension: Secondary | ICD-10-CM

## 2012-05-04 DIAGNOSIS — K644 Residual hemorrhoidal skin tags: Secondary | ICD-10-CM

## 2012-05-04 HISTORY — DX: Residual hemorrhoidal skin tags: K64.4

## 2012-05-04 HISTORY — DX: Essential (primary) hypertension: I10

## 2012-05-11 ENCOUNTER — Ambulatory Visit: Payer: Self-pay | Admitting: General Surgery

## 2012-05-16 ENCOUNTER — Other Ambulatory Visit: Payer: Self-pay | Admitting: Internal Medicine

## 2012-05-16 NOTE — Telephone Encounter (Signed)
Med filled.  

## 2012-05-23 ENCOUNTER — Other Ambulatory Visit: Payer: Self-pay | Admitting: Internal Medicine

## 2012-06-02 ENCOUNTER — Other Ambulatory Visit: Payer: Self-pay | Admitting: Internal Medicine

## 2012-06-02 NOTE — Telephone Encounter (Signed)
Ok to fill 

## 2012-06-18 ENCOUNTER — Other Ambulatory Visit: Payer: Self-pay

## 2012-06-19 ENCOUNTER — Other Ambulatory Visit: Payer: Self-pay | Admitting: Internal Medicine

## 2012-10-05 ENCOUNTER — Other Ambulatory Visit: Payer: Self-pay | Admitting: Internal Medicine

## 2012-10-31 ENCOUNTER — Encounter: Payer: Self-pay | Admitting: *Deleted

## 2012-11-21 ENCOUNTER — Other Ambulatory Visit: Payer: Self-pay | Admitting: Internal Medicine

## 2012-12-07 ENCOUNTER — Other Ambulatory Visit: Payer: Self-pay

## 2012-12-15 LAB — PROTIME-INR: Protime: 11.6 seconds (ref 10.0–13.8)

## 2012-12-15 LAB — POCT INR: INR: 1 (ref 0.9–1.1)

## 2012-12-15 LAB — BASIC METABOLIC PANEL
BUN: 22 mg/dL — AB (ref 4–21)
Creatinine: 0.6 mg/dL (ref 0.5–1.1)
Glucose: 105 mg/dL
Potassium: 4.6 mmol/L (ref 3.4–5.3)
Sodium: 140 mmol/L (ref 137–147)

## 2012-12-15 LAB — HEPATIC FUNCTION PANEL
ALT: 63 U/L — AB (ref 7–35)
AST: 44 U/L — AB (ref 13–35)
Alkaline Phosphatase: 69 U/L (ref 25–125)
Bilirubin, Total: 0.8 mg/dL

## 2012-12-15 LAB — LIPID PANEL
Cholesterol: 110 mg/dL (ref 0–200)
HDL: 23 mg/dL — AB (ref 35–70)
LDL Cholesterol: 41 mg/dL
Triglycerides: 230 mg/dL — AB (ref 40–160)

## 2012-12-15 LAB — CBC AND DIFFERENTIAL
Hemoglobin: 14.2 g/dL (ref 12.0–16.0)
Platelets: 169 10*3/uL (ref 150–399)
WBC: 7.3 10^3/mL

## 2012-12-15 LAB — HEMOGLOBIN A1C: Hgb A1c MFr Bld: 5.9 % (ref 4.0–6.0)

## 2012-12-26 ENCOUNTER — Other Ambulatory Visit: Payer: Self-pay | Admitting: Internal Medicine

## 2012-12-26 NOTE — Telephone Encounter (Signed)
Last visit 10/13, refill?

## 2013-01-21 ENCOUNTER — Other Ambulatory Visit: Payer: Self-pay | Admitting: Internal Medicine

## 2013-02-07 ENCOUNTER — Other Ambulatory Visit: Payer: Self-pay | Admitting: Internal Medicine

## 2013-02-07 NOTE — Telephone Encounter (Signed)
Appt sch 02/22/13

## 2013-02-20 ENCOUNTER — Other Ambulatory Visit: Payer: Self-pay | Admitting: *Deleted

## 2013-02-20 MED ORDER — FUROSEMIDE 40 MG PO TABS: 40.0000 mg | ORAL_TABLET | Freq: Every day | ORAL | Status: DC

## 2013-02-20 MED ORDER — SIMVASTATIN 40 MG PO TABS: 40.0000 mg | ORAL_TABLET | Freq: Every day | ORAL | Status: DC

## 2013-02-20 NOTE — Telephone Encounter (Signed)
Eprescribed.

## 2013-02-22 ENCOUNTER — Other Ambulatory Visit: Payer: Self-pay | Admitting: Internal Medicine

## 2013-02-22 ENCOUNTER — Telehealth: Payer: Self-pay | Admitting: Emergency Medicine

## 2013-02-22 ENCOUNTER — Encounter: Payer: Self-pay | Admitting: Internal Medicine

## 2013-02-22 ENCOUNTER — Ambulatory Visit (INDEPENDENT_AMBULATORY_CARE_PROVIDER_SITE_OTHER): Payer: 59 | Admitting: Internal Medicine

## 2013-02-22 VITALS — BP 144/80 | HR 86 | Temp 98.7°F | Resp 14

## 2013-02-22 DIAGNOSIS — E785 Hyperlipidemia, unspecified: Secondary | ICD-10-CM

## 2013-02-22 DIAGNOSIS — S42309D Unspecified fracture of shaft of humerus, unspecified arm, subsequent encounter for fracture with routine healing: Secondary | ICD-10-CM

## 2013-02-22 DIAGNOSIS — M542 Cervicalgia: Secondary | ICD-10-CM

## 2013-02-22 DIAGNOSIS — L989 Disorder of the skin and subcutaneous tissue, unspecified: Secondary | ICD-10-CM

## 2013-02-22 DIAGNOSIS — Z8711 Personal history of peptic ulcer disease: Secondary | ICD-10-CM

## 2013-02-22 DIAGNOSIS — S42301G Unspecified fracture of shaft of humerus, right arm, subsequent encounter for fracture with delayed healing: Secondary | ICD-10-CM

## 2013-02-22 DIAGNOSIS — M25512 Pain in left shoulder: Secondary | ICD-10-CM

## 2013-02-22 DIAGNOSIS — M25519 Pain in unspecified shoulder: Secondary | ICD-10-CM

## 2013-02-22 DIAGNOSIS — E669 Obesity, unspecified: Secondary | ICD-10-CM

## 2013-02-22 DIAGNOSIS — E118 Type 2 diabetes mellitus with unspecified complications: Secondary | ICD-10-CM

## 2013-02-22 DIAGNOSIS — Z8719 Personal history of other diseases of the digestive system: Secondary | ICD-10-CM

## 2013-02-22 DIAGNOSIS — E559 Vitamin D deficiency, unspecified: Secondary | ICD-10-CM

## 2013-02-22 DIAGNOSIS — K7689 Other specified diseases of liver: Secondary | ICD-10-CM

## 2013-02-22 DIAGNOSIS — Z0000 Encounter for general adult medical examination without abnormal findings: Secondary | ICD-10-CM

## 2013-02-22 DIAGNOSIS — E538 Deficiency of other specified B group vitamins: Secondary | ICD-10-CM

## 2013-02-22 DIAGNOSIS — E1169 Type 2 diabetes mellitus with other specified complication: Secondary | ICD-10-CM

## 2013-02-22 DIAGNOSIS — E119 Type 2 diabetes mellitus without complications: Secondary | ICD-10-CM

## 2013-02-22 DIAGNOSIS — M109 Gout, unspecified: Secondary | ICD-10-CM

## 2013-02-22 DIAGNOSIS — Z23 Encounter for immunization: Secondary | ICD-10-CM

## 2013-02-22 DIAGNOSIS — K7581 Nonalcoholic steatohepatitis (NASH): Secondary | ICD-10-CM

## 2013-02-22 LAB — FOLATE: Folate: 15.3 ng/mL (ref >5.9)

## 2013-02-22 LAB — MICROALBUMIN / CREATININE URINE RATIO
Creatinine,U: 13 mg/dL
Microalb Creat Ratio: 15.4 mg/g (ref 0.0–30.0)
Microalb, Ur: 2 mg/dL — ABNORMAL HIGH (ref 0.0–1.9)

## 2013-02-22 LAB — VITAMIN B12: Vitamin B-12: 430 pg/mL (ref 211–911)

## 2013-02-22 LAB — HM DIABETES FOOT EXAM: HM Diabetic Foot Exam: NORMAL

## 2013-02-22 NOTE — Telephone Encounter (Signed)
I don't understand,  They are refusing to allow her to see Dr Olevia Perches?

## 2013-02-22 NOTE — Patient Instructions (Signed)
Use debrox drops in each ear tonight and   Return tomorrow for ear irrigation   Checking  b12 today  appts for [facility] ([facility])   Tdap and flu given today.  Return in a week or so for Pneumonia

## 2013-02-22 NOTE — Progress Notes (Signed)
Patient ID: Kimberly Huff, female   DOB: Jul 31, 1945, 67 y.o.   MRN: [id number]    Subjective:     Kimberly Huff is a 67 y.o. female and is here for a comprehensive physical exam. Last seen one year ago The patient reports problems - as listed below.  Had labs August 14th at [facility],    DM: a1c was 5.9, taking metformin NASH:   PT/INR , platelets hgbWNl AST/ALT 44/63.  She is requesting to have local follow up for NASH,  Tired of going to [facility] .  Requesting endoscopy with Dr Olevia Perches for repeat EGD, since her hepatoloigst at [facility] said she needed follow up in one year for fatty liver.  Health maintenance: Had her annual breast exam by [facility]. Had her PAP smear repeated by [facility]; it  was normal.    Hemorrhoid interfering with ability to stop soiling underwear, but Jamal Collin would not remove it..   Her diarrhea has improved without allopurinol but no change with suspension of metformin so she has resumed it.  Obesity:  She has had weight gain,  Aggravated by suspension of exercise due to shoulder injury on the right.  October 2013.  Planning to restart .   Histroy of gastric ulcers:  Dr. Olevia Perches did an EGD in 2012 and noted ulcers.   Despite recs for no NSAIDS she has been taking ibuprofen daily for  New onset left shoulder pain and swelling. Taking omeprazole 40 mg daily.  No abdominal pain or dark stools  Shoulder pain:  .  Her pain did not respond to massage,  Went to chiropractor which helped.  Pain starts in left side of neck trapezius muscle and radiates down left arm   Requesting dermatology referral for general eye exam.  Derm  Right ear bothering her.   Itches, no pain or drainage.    History   Social History  . Marital Status: Single    Spouse Name: N/A    Number of Children: 2  . Years of Education: N/A   Occupational History  . Account executive    Social History Main Topics  . Smoking status: Former Smoker    Types: Cigarettes    Quit date: 09/15/1998  . Smokeless  tobacco: Never Used  . Alcohol Use: Yes     Comment: very rare  . Drug Use: No  . Sexual Activity: Not on file   Other Topics Concern  . Not on file   Social History Narrative  . No narrative on file   Health Maintenance  Topic Date Due  . Zostavax  08/27/2003  . Pneumococcal Polysaccharide Vaccine Age 86 And Over  08/26/2008  . Mammogram  05/26/2013  . Influenza Vaccine  12/02/2013  . Colonoscopy  12/24/2013  . Tetanus/tdap  02/23/2023    The following portions of the patient's history were reviewed and updated as appropriate: allergies, current medications, past family history, past medical history, past social history, past surgical history and problem list.  Review of Systems A comprehensive review of systems was negative.   Objective:   General Appearance:    Alert, cooperative, no distress, appears stated age  Head:    Normocephalic, without obvious abnormality, atraumatic  Eyes:    PERRL, conjunctiva/corneas clear, EOM's intact, fundi    benign, both eyes  Ears:    Normal TM's and external ear canals, both ears  Nose:   Nares normal, septum midline, mucosa normal, no drainage    or sinus tenderness  Throat:   Lips,  mucosa, and tongue normal; teeth and gums normal  Neck:   Supple, symmetrical, trachea midline, no adenopathy;    thyroid:  no enlargement/tenderness/nodules; no carotid   bruit or JVD  Back:     Symmetric, no curvature, ROM normal, no CVA tenderness  Lungs:     Clear to auscultation bilaterally, respirations unlabored  Chest Wall:    No tenderness or deformity   Heart:    Regular rate and rhythm, S1 and S2 normal, no murmur, rub   or gallop  Breast Exam:    deferred  Abdomen:     Soft, non-tender, bowel sounds active all four quadrants,    no masses, no organomegaly  Genitalia:    deferred   Extremities:   Extremities normal, atraumatic, no cyanosis or edema  Pulses:   2+ and symmetric all extremities  Skin:   Skin color, texture, turgor normal,  no rashes or lesions  Lymph nodes:   Cervical, supraclavicular, and axillary nodes normal  Neurologic:   CNII-XII intact, normal strength, sensation and reflexes    throughout    Assessment:   Humeral shaft fracture Now well healed  Gouty arthritis Uric acid level was recently elevated at 8.3 but trial of allopurinol caused diarrhea. Attacks are infrequent,  No preventive medications continued per discussion today  Dyslipidemia associated with type 2 diabetes mellitus Outside labs indicate dyslipidemia,  HDL is 23 and trigs 230,  LDL 41.  Taking sivastatin and asa.  Mediterranean low GI diet recommended.    NASH (nonalcoholic steatohepatitis) Liver biopsy 05/30/10 [facility]: moderate steatohepatitis, periportal and bridging fibrosis   Alpha fetoprotein 6.5  06/27/10 Hepatis A vaccinated Hepatitis b vaccinated  LFTs are minimally elevated,  PT normal,  No varices by last EGD 2012.  Refer to Dr Olevia Perches for follow u, as patient desires to transfer care from [facility].   Diabetes mellitus with complication X9J was 5.9 recently at outside facility [facility] August 2014) after being lost to follow up for one year. Her DM is well-controlled on metformin alone She is not up-to-date on eye exams and his foot exam is norma. l we'll repeat his urine microalbumin to creatinine ratio at next visit. He is on the appropriate medications.  Personal history of gastric ulcer By 2012 EGD,  Patient taking PPI but using NSAIDs for shoulder pain,  Advised to stop. Tramadol offered,  Avoiding tylenol due to NASH  Left shoulder pain History and exam suggest cervical disk disease as the cause for pain radiating down left arm.  Plain films ordered  Obesity (BMI 30-39.9) I have addressed  BMI and recommended wt loss of 10% of body weigh over the next 6 months using a low glycemic index diet and regular exercise a minimum of 5 days per week.    Routine general medical examination at a health care facility Annual  comprehensive exam was done excluding breast, pelvic and PAP smear. All screenings have been addressed .   A total of 60 minutes was spent with patient more than half of which was spent in counseling, reviewing records from other prviders and coordination of care.  Updated Medication List Outpatient Encounter Prescriptions as of 02/22/2013  Medication Sig Dispense Refill  . aspirin 81 MG tablet Take 325 mg by mouth daily.       . furosemide (LASIX) 40 MG tablet Take 1 tablet (40 mg total) by mouth daily. MUST KEEP APPT ON 10/22 FOR FURTHER REFILLS  30 tablet  0  . ibuprofen (ADVIL,MOTRIN) 800 MG  tablet TAKE 1 TABLET (800 MG TOTAL) BY MOUTH EVERY 8 (EIGHT) HOURS AS NEEDED.  60 tablet  2  . ibuprofen (ADVIL,MOTRIN) 800 MG tablet TAKE 1 TABLET (800 MG TOTAL) BY MOUTH EVERY 8 (EIGHT) HOURS AS NEEDED.  60 tablet  0  . losartan (COZAAR) 50 MG tablet TAKE 1 TABLET EVERY DAY  90 tablet  1  . metFORMIN (GLUCOPHAGE) 500 MG tablet TAKE ONE TABLET BY MOUTH TWICE DAILY  180 tablet  3  . omeprazole (PRILOSEC) 40 MG capsule TAKE ONE CAPSULE BY MOUTH DAILY  30 capsule  11  . potassium chloride (KLOR-CON 10) 10 MEQ tablet TAKE 1 TABLET BY MOUTH EVERY DAY  90 tablet  0  . simvastatin (ZOCOR) 40 MG tablet Take 1 tablet (40 mg total) by mouth at bedtime. MUST KEEP APPT ON 10/22 FOR FURTHER REFILLS  31 tablet  0  . [DISCONTINUED] ibuprofen (ADVIL,MOTRIN) 800 MG tablet TAKE 1 TABLET (800 MG TOTAL) BY MOUTH EVERY 8 (EIGHT) HOURS AS NEEDED.  60 tablet  2  . [DISCONTINUED] allopurinol (ZYLOPRIM) 300 MG tablet TAKE 1 TABLET (300 MG TOTAL) BY MOUTH DAILY.  30 tablet  6  . [DISCONTINUED] nystatin cream (MYCOSTATIN) Apply topically 2 (two) times daily.  30 g  0  . [DISCONTINUED] oxyCODONE (OXY IR/ROXICODONE) 5 MG immediate release tablet Take 5 mg by mouth every 6 (six) hours as needed.       No facility-administered encounter medications on file as of 02/22/2013.

## 2013-02-22 NOTE — Telephone Encounter (Signed)
FYI

## 2013-02-22 NOTE — Telephone Encounter (Signed)
Per Alyse Low at [facility], they have cancelled the apt scheduled with me earlier this morning. The patient has been multiple GI doctors in the past since see Dr. Olevia Perches.

## 2013-02-23 ENCOUNTER — Ambulatory Visit (INDEPENDENT_AMBULATORY_CARE_PROVIDER_SITE_OTHER)
Admission: RE | Admit: 2013-02-23 | Discharge: 2013-02-23 | Disposition: A | Discharge status: Home / Self Care | Payer: 59 | Source: Ambulatory Visit | Attending: Internal Medicine | Admitting: Internal Medicine

## 2013-02-23 ENCOUNTER — Encounter: Payer: Self-pay | Admitting: Internal Medicine

## 2013-02-23 ENCOUNTER — Ambulatory Visit (INDEPENDENT_AMBULATORY_CARE_PROVIDER_SITE_OTHER): Payer: 59 | Admitting: *Deleted

## 2013-02-23 DIAGNOSIS — H6123 Impacted cerumen, bilateral: Secondary | ICD-10-CM

## 2013-02-23 DIAGNOSIS — E559 Vitamin D deficiency, unspecified: Secondary | ICD-10-CM | POA: Insufficient documentation

## 2013-02-23 DIAGNOSIS — Z8719 Personal history of other diseases of the digestive system: Secondary | ICD-10-CM | POA: Insufficient documentation

## 2013-02-23 DIAGNOSIS — M25512 Pain in left shoulder: Secondary | ICD-10-CM

## 2013-02-23 DIAGNOSIS — H6120 Impacted cerumen, unspecified ear: Secondary | ICD-10-CM

## 2013-02-23 DIAGNOSIS — M542 Cervicalgia: Secondary | ICD-10-CM

## 2013-02-23 DIAGNOSIS — M25511 Pain in right shoulder: Secondary | ICD-10-CM | POA: Insufficient documentation

## 2013-02-23 DIAGNOSIS — Z8711 Personal history of peptic ulcer disease: Secondary | ICD-10-CM | POA: Insufficient documentation

## 2013-02-23 DIAGNOSIS — Z0000 Encounter for general adult medical examination without abnormal findings: Secondary | ICD-10-CM | POA: Insufficient documentation

## 2013-02-23 LAB — VITAMIN D 25 HYDROXY (VIT D DEFICIENCY, FRACTURES): Vit D, 25-Hydroxy: 22 ng/mL — ABNORMAL LOW (ref 30–89)

## 2013-02-23 MED ORDER — ERGOCALCIFEROL 1.25 MG (50000 UT) PO CAPS: 50000.0000 [IU] | ORAL_CAPSULE | ORAL | Status: DC

## 2013-02-23 NOTE — Telephone Encounter (Signed)
That is correct, patient has been evaluated by "another GI" since seeing Brodie in 2012.

## 2013-02-23 NOTE — Assessment & Plan Note (Addendum)
Liver biopsy 05/30/10 [facility]: moderate steatohepatitis, periportal and bridging fibrosis   Alpha fetoprotein 6.5  06/27/10 Hepatis A vaccinated Hepatitis b vaccinated  LFTs are minimally elevated,  PT normal,  No varices by last EGD 2012.  Refer to Dr Olevia Perches for follow u, as patient desires to transfer care from [facility].

## 2013-02-23 NOTE — Assessment & Plan Note (Signed)
Annual comprehensive exam was done excluding breast, pelvic and PAP smear. All screenings have been addressed .  

## 2013-02-23 NOTE — Assessment & Plan Note (Signed)
Outside labs indicate dyslipidemia,  HDL is 23 and trigs 230,  LDL 41.  Taking sivastatin and asa.  Mediterranean low GI diet recommended.

## 2013-02-23 NOTE — Assessment & Plan Note (Addendum)
By 2012 EGD,  Patient taking PPI but using NSAIDs for shoulder pain,  Advised to stop. Tramadol offered,  Avoiding tylenol due to NASH

## 2013-02-23 NOTE — Assessment & Plan Note (Signed)
Now well healed

## 2013-02-23 NOTE — Telephone Encounter (Signed)
Per the patient they called her and told her if she obtained all her records from [facility] she can see Dr. Olevia Perches. I spoke with the patient and she stated she didn't want to go to [facility] for her endoscopy. She wants to transfer her care from [place] to [place]. She is going to email me all her records on Monday from GI. I will then forward those to Lydia for "review" so they Dr. Olevia Perches can see the patient. Long story short: pt cant have two GI doctors.

## 2013-02-23 NOTE — Assessment & Plan Note (Signed)
a1c was 5.9 recently at outside facility [facility] August 2014) after being lost to follow up for one year. Her DM is well-controlled on metformin alone She is not up-to-date on eye exams and his foot exam is norma. l we'll repeat his urine microalbumin to creatinine ratio at next visit. He is on the appropriate medications.

## 2013-02-23 NOTE — Assessment & Plan Note (Signed)
I have addressed  BMI and recommended wt loss of 10% of body weigh over the next 6 months using a low glycemic index diet and regular exercise a minimum of 5 days per week.   

## 2013-02-23 NOTE — Addendum Note (Signed)
Addended by: [name] on: 02/23/2013 10:28 AM   Modules accepted: Orders

## 2013-02-23 NOTE — Assessment & Plan Note (Signed)
Uric acid level was recently elevated at 8.3 but trial of allopurinol caused diarrhea. Attacks are infrequent,  No preventive medications continued per discussion today

## 2013-02-23 NOTE — Assessment & Plan Note (Signed)
History and exam suggest cervical disk disease as the cause for pain radiating down left arm.  Plain films ordered

## 2013-02-23 NOTE — Telephone Encounter (Signed)
Her records are available via [employer] , and she brought me copies of the biopsy report and recent labs which are being scanned in today

## 2013-02-26 ENCOUNTER — Other Ambulatory Visit: Payer: Self-pay | Admitting: Internal Medicine

## 2013-02-26 ENCOUNTER — Encounter: Payer: Self-pay | Admitting: Internal Medicine

## 2013-03-14 ENCOUNTER — Telehealth: Payer: Self-pay | Admitting: Internal Medicine

## 2013-03-14 DIAGNOSIS — M5090 Cervical disc disorder, unspecified, unspecified cervical region: Secondary | ICD-10-CM

## 2013-03-14 NOTE — Telephone Encounter (Signed)
The patient is wanting an MRI done. She is going to [employer] and he thinks she should go and have this done.

## 2013-03-14 NOTE — Telephone Encounter (Signed)
Please advise 

## 2013-03-15 ENCOUNTER — Telehealth: Payer: Self-pay | Admitting: Internal Medicine

## 2013-03-15 NOTE — Telephone Encounter (Signed)
I have ordered the MRI of the cervical spien,  Based on review of my notes from July visit  But please tell staff that when they take messages from patients regarding referral,  Etc, more information needs to be obtained   Not just "needs an MRI" or "needs an ENT referral")

## 2013-03-16 ENCOUNTER — Other Ambulatory Visit: Payer: Self-pay | Admitting: Internal Medicine

## 2013-03-16 ENCOUNTER — Ambulatory Visit (INDEPENDENT_AMBULATORY_CARE_PROVIDER_SITE_OTHER): Payer: 59 | Admitting: *Deleted

## 2013-03-16 DIAGNOSIS — Z23 Encounter for immunization: Secondary | ICD-10-CM

## 2013-03-16 MED ORDER — ZOSTER VACCINE LIVE 19400 UNT/0.65ML ~~LOC~~ SOLR: 0.6500 mL | Freq: Once | SUBCUTANEOUS | Status: DC

## 2013-03-16 NOTE — Progress Notes (Signed)
  Subjective:    Patient ID: Kimberly Huff, female    DOB: 1945/11/07, 67 y.o.   MRN: [id number]  HPI    Review of Systems     Objective:   Physical Exam        Assessment & Plan:

## 2013-03-16 NOTE — Telephone Encounter (Signed)
Scheduling in process per [employer].

## 2013-03-16 NOTE — Progress Notes (Signed)
Pt is unsure whether she has had a Pneumonia vaccine in the past and unsure at what age. Dr. Derrel Nip notified and advised to give Pneumovax. Pt given Rx for shingles vaccine.

## 2013-03-20 ENCOUNTER — Other Ambulatory Visit: Payer: Self-pay | Admitting: *Deleted

## 2013-03-20 MED ORDER — IBUPROFEN 800 MG PO TABS: ORAL_TABLET | ORAL | Status: DC

## 2013-03-20 NOTE — Telephone Encounter (Signed)
Refill

## 2013-03-21 ENCOUNTER — Other Ambulatory Visit: Payer: Self-pay | Admitting: *Deleted

## 2013-03-21 NOTE — Telephone Encounter (Signed)
ibuprofen was refilled yesterday,

## 2013-03-23 ENCOUNTER — Other Ambulatory Visit: Payer: Self-pay | Admitting: Internal Medicine

## 2013-03-23 ENCOUNTER — Encounter: Payer: Self-pay | Admitting: *Deleted

## 2013-03-24 NOTE — Telephone Encounter (Signed)
appt set with Dr. Olevia Perches 05/10/13

## 2013-03-26 ENCOUNTER — Other Ambulatory Visit: Payer: Self-pay | Admitting: Internal Medicine

## 2013-03-26 ENCOUNTER — Encounter: Payer: Self-pay | Admitting: Internal Medicine

## 2013-03-27 ENCOUNTER — Other Ambulatory Visit: Payer: Self-pay | Admitting: Internal Medicine

## 2013-03-27 MED ORDER — IBUPROFEN 800 MG PO TABS: ORAL_TABLET | ORAL | Status: DC

## 2013-03-28 ENCOUNTER — Other Ambulatory Visit: Payer: Self-pay | Admitting: *Deleted

## 2013-03-28 ENCOUNTER — Ambulatory Visit: Payer: 59 | Admitting: Internal Medicine

## 2013-03-28 MED ORDER — FUROSEMIDE 40 MG PO TABS: 40.0000 mg | ORAL_TABLET | Freq: Every day | ORAL | Status: DC

## 2013-04-04 ENCOUNTER — Ambulatory Visit: Payer: Self-pay | Admitting: Internal Medicine

## 2013-04-05 ENCOUNTER — Telehealth: Payer: Self-pay | Admitting: Internal Medicine

## 2013-04-05 DIAGNOSIS — M4802 Spinal stenosis, cervical region: Secondary | ICD-10-CM

## 2013-04-05 DIAGNOSIS — M25512 Pain in left shoulder: Secondary | ICD-10-CM

## 2013-04-05 NOTE — Telephone Encounter (Signed)
Left message, notifying of results and requested call back regarding neurosurgeon referral

## 2013-04-05 NOTE — Telephone Encounter (Signed)
MRI of cervical spine is abnormal.  I recommend seeing a neurosugeon for evaluation of stenosis at the C6 level.  Doe she have a preference?

## 2013-04-05 NOTE — Assessment & Plan Note (Signed)
MRI shows stenosis at C5-6 level affecting both c6 nerve roots

## 2013-04-05 NOTE — Telephone Encounter (Signed)
Patient stated she would talk with a friend whom is a Publishing rights manager and will advise.

## 2013-04-06 NOTE — Telephone Encounter (Signed)
Patient says that she prefers to see Dr. Newman Pies  In [place] would like to talk to him as soon as possible.  Patient thinks he is affiliated with [facility].

## 2013-04-07 NOTE — Telephone Encounter (Signed)
Left message, notifying pt referral in process and Amber will call her when appointment is scheduled.

## 2013-04-07 NOTE — Telephone Encounter (Signed)
Referral is in process as requested

## 2013-04-11 ENCOUNTER — Encounter: Payer: Self-pay | Admitting: Internal Medicine

## 2013-04-12 MED ORDER — TIZANIDINE HCL 4 MG PO CAPS: 4.0000 mg | ORAL_CAPSULE | Freq: Three times a day (TID) | ORAL | Status: DC

## 2013-04-12 MED ORDER — TRAMADOL HCL 50 MG PO TABS: 100.0000 mg | ORAL_TABLET | Freq: Four times a day (QID) | ORAL | Status: DC | PRN

## 2013-04-12 NOTE — Telephone Encounter (Signed)
Please fax or call in the tramadol for Kimberly Huff,  thanks

## 2013-04-12 NOTE — Telephone Encounter (Signed)
Rx faxed

## 2013-04-18 ENCOUNTER — Encounter: Payer: Self-pay | Admitting: Internal Medicine

## 2013-04-20 ENCOUNTER — Encounter: Payer: Self-pay | Admitting: Internal Medicine

## 2013-05-10 ENCOUNTER — Ambulatory Visit (INDEPENDENT_AMBULATORY_CARE_PROVIDER_SITE_OTHER): Payer: 59 | Admitting: Internal Medicine

## 2013-05-10 ENCOUNTER — Encounter: Payer: Self-pay | Admitting: Internal Medicine

## 2013-05-10 VITALS — BP 134/80 | HR 76 | Ht 63.5 in | Wt 206.0 lb

## 2013-05-10 DIAGNOSIS — K7460 Unspecified cirrhosis of liver: Secondary | ICD-10-CM

## 2013-05-10 DIAGNOSIS — K7581 Nonalcoholic steatohepatitis (NASH): Secondary | ICD-10-CM

## 2013-05-10 DIAGNOSIS — K7689 Other specified diseases of liver: Secondary | ICD-10-CM

## 2013-05-10 NOTE — Progress Notes (Signed)
Kimberly Huff 09-30-1945 [id number]   History of Present Illness:  This is a 68 year old white female who is here to discuss an upper endoscopy. She has a diagnosis of NASH, based on a liver biopsy from January 2012 at [facility] which showed grade 3 fibrosis and steatohepatitis. We saw her in April 2012 for an upper GI bleed which was thought to be due to excess of ibuprofen ( she takes 853m bid). She had multiple ulcers but was H. pylori negative. She has been on omeprazole 40 mg daily and denies any upper GI symptoms. She has multiple other medical problems including vitamin D deficiency, B12 deficiency and type 2 diabetes. [place] She would be due for a recall colonoscopy in August 2015. [facility] having liver function tests every 3 months and upper abdominal ultrasound every 6 months. She is thinking about switching her liver care to a local specialist. She was recommended to have an updated upper endoscopy to look for esophageal varices.    Past Medical History  Diagnosis Date  . Kidney stone   . Diverticulosis 2005    Dr SJamal Collin . Coronary artery disease   . Hypertension   . Hyperlipidemia   . Type II or unspecified type diabetes mellitus without mention of complication, uncontrolled   . Hypopotassemia   . Atrophic kidney     right  . Osteoarthritis   . Coronary atherosclerosis   . Obesity   . Myocardial infarction 2000  . Recurrent UTI   . Steatohepatitis 05/30/10    Brunt Stage 3  . Diabetes mellitus   . Allergy   . Blood transfusion   . Cataract   . Clotting disorder     blood clots in abdomen after surgery  . Neuromuscular disorder     "achy muscles"  . External hemorrhoids without mention of complication 27341 . Unspecified essential hypertension 2014  . Heart murmur 2012    found by Dr. SJamal Collin . H/O cardiac catheterization 2005    Past Surgical History  Procedure  Laterality Date  . Breast mass excision      benign  . Ureterotomy  1960/1969    x 2 during childhood  . Abdominal hysterectomy  1982    complete  . Ptca  2000  . Vaginal hysterectomy      PT DENIES ABD HYSTERECTOMY  . Foot surgery  2008  . Cardiac stents  2000  . Tonsillectomy    . Salpingoophorectomy    . Colonoscopy  2004    Dr. SJamal Collin . Eye surgery Bilateral 2012    cataract    No Known Allergies  Family history and social history have been reviewed.  Review of Systems: Denies dysphagia heartburn nausea vomiting  The remainder of the 10 point ROS is negative except as outlined in the H&P  Physical Exam: General Appearance Well developed, in no distress overweight Eyes  Non icteric  HEENT  Non traumatic, normocephalic  Mouth No lesion, tongue papillated, no cheilosis Neck Supple without adenopathy, thyroid not enlarged, no carotid bruits, no JVD Lungs Clear to auscultation bilaterally COR Normal S1, normal S2, regular rhythm, no murmur, quiet precordium Abdomen protuberant soft nontender with normoactive bowel sounds. Liver edge at costal margin. Spleen not enlarged Rectal not done Extremities  trace pedal edema Skin No lesions no spider nevi Neurological Alert and oriented x 3 no asterixis Psychological Normal  mood and affect  Assessment and Plan:   Problem #33 68 year old, white female with grade 3 fibrosis due to NASH documented on liver biopsies from January 2012. She is an appropriate candidate for an upper endoscopy to look for signs of portal hypertension. She has a history of an acute upper GI bleed due to ibuprofen which she continues to take for  cervical spine disease. She is also taking Prilosec 40 mg daily. I will be happy to do an upper endoscopy for the above reasons.  Problem #2 Colorectal screening. She would be due for a recall colonoscopy in August 2015. Her last exam was done by Dr Jamal Collin, and she was very satisfied with his exam. It is her choice  if she wants to follow up with him.  Problem #3 Obesity. Patient initially lost weight but has gained it back. She is planning to start an exercise and diet program in the next few weeks. I support her decision since the only effective treatment of NASH is weight loss through exercise., the only effective treatment being weight loss.    Delfin Edis 05/10/2013

## 2013-05-10 NOTE — Patient Instructions (Signed)
You have been scheduled for an endoscopy with propofol. Please follow written instructions given to you at your visit today. If you use inhalers (even only as needed), please bring them with you on the day of your procedure. Your physician has requested that you go to www.startemmi.com and enter the access code given to you at your visit today. This web site gives a general overview about your procedure. However, you should still follow specific instructions given to you by our office regarding your preparation for the procedure.  CC: Dr Deborra Medina

## 2013-05-14 ENCOUNTER — Other Ambulatory Visit: Payer: Self-pay | Admitting: Internal Medicine

## 2013-05-15 ENCOUNTER — Ambulatory Visit: Payer: Self-pay | Admitting: General Surgery

## 2013-05-16 ENCOUNTER — Encounter: Payer: Self-pay | Admitting: Internal Medicine

## 2013-05-16 ENCOUNTER — Encounter: Payer: Self-pay | Admitting: General Surgery

## 2013-05-19 ENCOUNTER — Ambulatory Visit: Payer: Self-pay | Admitting: General Surgery

## 2013-05-20 LAB — HM MAMMOGRAPHY: HM Mammogram: NORMAL

## 2013-05-22 ENCOUNTER — Encounter: Payer: Self-pay | Admitting: General Surgery

## 2013-05-23 ENCOUNTER — Ambulatory Visit (INDEPENDENT_AMBULATORY_CARE_PROVIDER_SITE_OTHER): Payer: 59 | Admitting: General Surgery

## 2013-05-23 ENCOUNTER — Encounter: Payer: Self-pay | Admitting: General Surgery

## 2013-05-23 VITALS — BP 140/72 | HR 68 | Resp 14 | Ht 63.5 in | Wt 206.0 lb

## 2013-05-23 DIAGNOSIS — Z1239 Encounter for other screening for malignant neoplasm of breast: Secondary | ICD-10-CM

## 2013-05-23 DIAGNOSIS — N6019 Diffuse cystic mastopathy of unspecified breast: Secondary | ICD-10-CM

## 2013-05-23 NOTE — Patient Instructions (Addendum)
Continue self breast exams. Call office for any new breast issues or concerns. Bilateral screening mammogram and office visit in one year.

## 2013-05-23 NOTE — Progress Notes (Signed)
Patient ID: Kimberly Huff, female   DOB: January 20, 1946, 68 y.o.   MRN: [id number]  Chief Complaint  Patient presents with  . Follow-up    mammogram    HPI Kimberly Huff is a 68 y.o. female.  who presents for her annual follow up breast evaluation. The most recent mammogram was done on 05-12-13.  Patient does perform regular self breast checks and gets regular mammograms done.  No new breast complaints.  HPI  Past Medical History  Diagnosis Date  . Kidney stone   . Diverticulosis 2005    Dr Kimberly Huff  . Coronary artery disease   . Hypertension   . Hyperlipidemia   . Type II or unspecified type diabetes mellitus without mention of complication, uncontrolled   . Hypopotassemia   . Atrophic kidney     right  . Osteoarthritis   . Coronary atherosclerosis   . Obesity   . Myocardial infarction 2000  . Recurrent UTI   . Steatohepatitis 05/30/10    Brunt Stage 3  . Diabetes mellitus   . Allergy   . Blood transfusion   . Cataract   . Clotting disorder     blood clots in abdomen after surgery  . Neuromuscular disorder     "achy muscles"  . External hemorrhoids without mention of complication 4481  . Unspecified essential hypertension 2014  . Heart murmur 2012    found by Dr. Jamal Huff  . H/O cardiac catheterization 2005  . Spinal stenosis of cervical region     Past Surgical History  Procedure Laterality Date  . Breast mass excision      benign  . Ureterotomy  1960/1969    x 2 during childhood  . Abdominal hysterectomy  1982    complete  . Ptca  2000  . Vaginal hysterectomy      PT DENIES ABD HYSTERECTOMY  . Foot surgery  2008  . Cardiac stents  2000  . Tonsillectomy    . Salpingoophorectomy    . Colonoscopy  2004    Dr. Jamal Huff  . Eye surgery Bilateral 2012    cataract  . Upper gi endoscopy      Family History  Problem Relation Age of Onset  . Autoimmune disease Daughter   . Thyroid disease Daughter   . Thyroid disease Sister   . Lung cancer Father   . Heart disease  Father   . Diverticulosis Mother   . Diabetes Mother   . Stomach cancer Maternal Aunt   . Cancer Maternal Aunt     breast cancer    Social History History  Substance Use Topics  . Smoking status: Former Smoker    Types: Cigarettes    Quit date: 09/15/1998  . Smokeless tobacco: Never Used  . Alcohol Use: Yes     Comment: very rare    No Known Allergies  Current Outpatient Prescriptions  Medication Sig Dispense Refill  . allopurinol (ZYLOPRIM) 100 MG tablet Take by mouth.      Marland Kitchen aspirin 325 MG tablet Take by mouth.      . ergocalciferol (DRISDOL) 50000 UNITS capsule Take 1 capsule (50,000 Units total) by mouth once a week.  4 capsule  3  . furosemide (LASIX) 40 MG tablet Take 1 tablet (40 mg total) by mouth daily.  30 tablet  5  . ibuprofen (ADVIL,MOTRIN) 800 MG tablet TAKE 1 TABLET (800 MG TOTAL) BY MOUTH EVERY 8 (EIGHT) HOURS AS NEEDED.  60 tablet  1  . losartan (COZAAR)  50 MG tablet TAKE 1 TABLET EVERY DAY  90 tablet  1  . metFORMIN (GLUCOPHAGE) 500 MG tablet Take one tablet by mouth twice daily  180 tablet  2  . omeprazole (PRILOSEC) 40 MG capsule TAKE ONE CAPSULE BY MOUTH DAILY  30 capsule  11  . potassium chloride (K-DUR) 10 MEQ tablet TAKE 1 TABLET BY MOUTH EVERY DAY  90 tablet  1  . simvastatin (ZOCOR) 40 MG tablet Take 1 tablet (40 mg total) by mouth at bedtime.  30 tablet  5  . tiZANidine (ZANAFLEX) 4 MG capsule Take 1 capsule (4 mg total) by mouth 3 (three) times daily.  90 capsule  1  . zoster vaccine live, PF, (ZOSTAVAX) 25852 UNT/0.65ML injection Inject 19,400 Units into the skin once.  1 each  0   No current facility-administered medications for this visit.    Review of Systems Review of Systems  Constitutional: Negative.   Respiratory: Negative.   Cardiovascular: Negative.     Blood pressure 140/72, pulse 68, resp. rate 14, height 5' 3.5" (1.613 m), weight 206 lb (93.441 kg).  Physical Exam Physical Exam  Constitutional: She is oriented to person,  place, and time. She appears well-developed and well-nourished.  Eyes: No scleral icterus.  Neck: Neck supple.  Cardiovascular: Normal rate, regular rhythm and normal heart sounds.   Pulmonary/Chest: Effort normal and breath sounds normal. Right breast exhibits no inverted nipple, no mass, no nipple discharge, no skin change and no tenderness. Left breast exhibits no inverted nipple, no mass, no nipple discharge, no skin change and no tenderness.  Lymphadenopathy:    She has no cervical adenopathy.    She has no axillary adenopathy.  Neurological: She is alert and oriented to person, place, and time.  Skin: Skin is warm and dry.    Data Reviewed Mammogram reviewed and stable.  Assessment     Stable physical exam.  Plan    Bilateral screening mammogram and office visit in one year.       Kimberly Huff G 05/24/2013, 6:39 AM

## 2013-05-24 ENCOUNTER — Encounter: Payer: Self-pay | Admitting: General Surgery

## 2013-05-24 ENCOUNTER — Telehealth: Payer: Self-pay | Admitting: Internal Medicine

## 2013-05-24 DIAGNOSIS — Z1211 Encounter for screening for malignant neoplasm of colon: Secondary | ICD-10-CM

## 2013-05-24 NOTE — Telephone Encounter (Signed)
Yes, I can do both. Feb 3 is Tuesday.

## 2013-05-25 NOTE — Telephone Encounter (Signed)
I have spoken to patient. She is scheduled for endoscopy/colonoscopy on 06/06/13 @ 1:30 pm. She will come for instructions on 06/01/13 @ 9:30 am with me (since there are no previsits available). Patient verbalizes understanding.

## 2013-05-28 ENCOUNTER — Other Ambulatory Visit: Payer: Self-pay | Admitting: Internal Medicine

## 2013-05-29 ENCOUNTER — Encounter: Payer: Self-pay | Admitting: Internal Medicine

## 2013-06-05 ENCOUNTER — Telehealth: Payer: Self-pay | Admitting: Internal Medicine

## 2013-06-05 MED ORDER — MOVIPREP 100 G PO SOLR: 1.0000 | Freq: Once | ORAL | Status: DC

## 2013-06-05 NOTE — Telephone Encounter (Signed)
Rx sent to pharmacy. Message left to advise patient.

## 2013-06-06 ENCOUNTER — Encounter: Payer: 59 | Admitting: Internal Medicine

## 2013-06-06 ENCOUNTER — Encounter: Payer: Self-pay | Admitting: Internal Medicine

## 2013-06-06 ENCOUNTER — Ambulatory Visit (AMBULATORY_SURGERY_CENTER): Payer: BC Managed Care – PPO | Admitting: Internal Medicine

## 2013-06-06 VITALS — BP 161/85 | HR 64 | Temp 97.1°F | Resp 24 | Ht 63.5 in | Wt 206.0 lb

## 2013-06-06 DIAGNOSIS — K7581 Nonalcoholic steatohepatitis (NASH): Secondary | ICD-10-CM

## 2013-06-06 DIAGNOSIS — K7689 Other specified diseases of liver: Secondary | ICD-10-CM

## 2013-06-06 DIAGNOSIS — D126 Benign neoplasm of colon, unspecified: Secondary | ICD-10-CM

## 2013-06-06 DIAGNOSIS — K7460 Unspecified cirrhosis of liver: Secondary | ICD-10-CM

## 2013-06-06 DIAGNOSIS — Z1211 Encounter for screening for malignant neoplasm of colon: Secondary | ICD-10-CM

## 2013-06-06 MED ORDER — SODIUM CHLORIDE 0.9 % IV SOLN: 500.0000 mL | INTRAVENOUS | Status: DC

## 2013-06-06 NOTE — Patient Instructions (Addendum)
YOU HAD AN ENDOSCOPIC PROCEDURE TODAY AT [facility]: Refer to the procedure report that was given to you for any specific questions about what was found during the examination.  If the procedure report does not answer your questions, please call your gastroenterologist to clarify.  If you requested that your care partner not be given the details of your procedure findings, then the procedure report has been included in a sealed envelope for you to review at your convenience later.  YOU SHOULD EXPECT: Some feelings of bloating in the abdomen. Passage of more gas than usual.  Walking can help get rid of the air that was put into your GI tract during the procedure and reduce the bloating. If you had a lower endoscopy (such as a colonoscopy or flexible sigmoidoscopy) you may notice spotting of blood in your stool or on the toilet paper. If you underwent a bowel prep for your procedure, then you may not have a normal bowel movement for a few days.  DIET: Your first meal following the procedure should be a light meal and then it is ok to progress to your normal diet.  A half-sandwich or bowl of soup is an example of a good first meal.  Heavy or fried foods are harder to digest and may make you feel nauseous or bloated.  Likewise meals heavy in dairy and vegetables can cause extra gas to form and this can also increase the bloating.  Drink plenty of fluids but you should avoid alcoholic beverages for 24 hours.  ACTIVITY: Your care partner should take you home directly after the procedure.  You should plan to take it easy, moving slowly for the rest of the day.  You can resume normal activity the day after the procedure however you should NOT DRIVE or use heavy machinery for 24 hours (because of the sedation medicines used during the test).    SYMPTOMS TO REPORT IMMEDIATELY: A gastroenterologist can be reached at any hour.  During normal business hours, 8:30 AM to 5:00 PM Monday through Friday,  call [phone].  After hours and on weekends, please call the GI answering service at [phone] who will take a message and have the physician on call contact you.   Following lower endoscopy (colonoscopy or flexible sigmoidoscopy):  Excessive amounts of blood in the stool  Significant tenderness or worsening of abdominal pains  Swelling of the abdomen that is new, acute  Fever of 100F or higher  Following upper endoscopy (EGD)  Vomiting of blood or coffee ground material  New chest pain or pain under the shoulder blades  Painful or persistently difficult swallowing  New shortness of breath  Fever of 100F or higher  Black, tarry-looking stools  FOLLOW UP: If any biopsies were taken you will be contacted by phone or by letter within the next 1-3 weeks.  Call your gastroenterologist if you have not heard about the biopsies in 3 weeks.  Our staff will call the home number listed on your records the next business day following your procedure to check on you and address any questions or concerns that you may have at that time regarding the information given to you following your procedure. This is a courtesy call and so if there is no answer at the home number and we have not heard from you through the emergency physician on call, we will assume that you have returned to your regular daily activities without incident.  SIGNATURES/CONFIDENTIALITY: You and/or your care  partner have signed paperwork which will be entered into your electronic medical record.  These signatures attest to the fact that that the information above on your After Visit Summary has been reviewed and is understood.  Full responsibility of the confidentiality of this discharge information lies with you and/or your care-partner.      CONTINUE YOUR MEDS FOR YOUR GERD PER DR. Olevia Perches.      NO ASPIRIN NOR NSAIDS  X 2 WEEKS.  HIGH FIBER DIET.

## 2013-06-06 NOTE — Progress Notes (Signed)
Patient states that she will not take tylenol due to her liver problems.

## 2013-06-06 NOTE — Progress Notes (Signed)
Report to pacu rn, vss, bbs=clear 

## 2013-06-06 NOTE — Op Note (Signed)
[place]  [address]. [place], [address]   COLONOSCOPY PROCEDURE REPORT  PATIENT: Kimberly Huff, Kimberly Huff  MR#: [id number] BIRTHDATE: [birth year] , 66  yrs. old GENDER: Female ENDOSCOPIST: Lafayette Dragon, MD REFERRED PX:TGGYIR Derrel Nip, M.D. PROCEDURE DATE:  06/06/2013 PROCEDURE:   Colonoscopy with cold biopsy polypectomy and Colonoscopy with snare polypectomy First Screening Colonoscopy - Avg.  risk and is 50 yrs.  old or older - No.  Prior Negative Screening - Now for repeat screening. 10 or more years since last screening  History of Adenoma - Now for follow-up colonoscopy & has been > or = to 3 yrs.  N/A  Polyps Removed Today? Yes. ASA CLASS:   Class II INDICATIONS:Average risk patient for colon cancer and iron colonoscopy in 2005 was normal. MEDICATIONS: MAC sedation, administered by CRNA and propofol (Diprivan) 2107m IV  DESCRIPTION OF PROCEDURE:   After the risks benefits and alternatives of the procedure were thoroughly explained, informed consent was obtained.  A digital rectal exam revealed no abnormalities of the rectum.   The LB [id number] endoscope was introduced through the anus and advanced to the cecum, which was identified by both the appendix and ileocecal valve. No adverse events experienced.   The quality of the prep was good, using MoviPrep  The instrument was then slowly withdrawn as the colon was fully examined.      COLON FINDINGS: Two polypoid shaped sessile polyps ranging between 5-932min size were found at the cecum and in the descending colon. A polypectomy was performed with a cold snare and with cold forceps.  The resection was complete and the polyp tissue was completely retrieved.  Retroflexed views revealed no abnormalities. The time to cecum=6 minutes 06 seconds.  Withdrawal time=12 minutes 23 seconds.  The scope was withdrawn and the procedure completed. COMPLICATIONS: There were no complications.  ENDOSCOPIC  IMPRESSION: Two sessile polyps ranging between 5-2m45mn size were found at the cecum and in the descending colon; polypectomy was performed with a cold snare and with cold forceps `mild diverticulosis of the sigmoid colon RECOMMENDATIONS: 1.  Await biopsy results 2.  high fiber diet No aspirin for 2 weeks Recall colonoscopy pending path report   eSigned:  DorLafayette DragonD 06/06/2013 2:37 PM   cc:   PATIENT NAME:  Kimberly Huff, [place]#: [id number]

## 2013-06-06 NOTE — Op Note (Signed)
[place]  [address]. [place], [address]   ENDOSCOPY PROCEDURE REPORT  PATIENT: Kimberly, Huff  MR#: [id number] BIRTHDATE: 09-May-1945 , 1  yrs. old GENDER: Female ENDOSCOPIST: Lafayette Dragon, MD REFERRED BY:  Deborra Medina, M.D. PROCEDURE DATE:  06/06/2013 PROCEDURE:  EGD, diagnostic ASA CLASS:     Class III INDICATIONS:  Hx of NASH, screeniong for varices,. MEDICATIONS: MAC sedation, administered by CRNA, Propofol (Diprivan), and Propofol (Diprivan) 75 mg IV TOPICAL ANESTHETIC: Cetacaine Spray  DESCRIPTION OF PROCEDURE: After the risks benefits and alternatives of the procedure were thoroughly explained, informed consent was obtained.  The LB [id number] endoscope was introduced through the mouth and advanced to the second portion of the duodenum. Without limitations.  The instrument was slowly withdrawn as the mucosa was fully examined.        ESOPHAGUS: The mucosa of the esophagus appeared normal.   consult esophageal varices  STOMACH: The mucosa of the stomach appeared normal.no evidence for gastric varices or portal hypertensive gastropathy  DUODENUM: The duodenal mucosa showed no abnormalities.  Retroflexed views revealed no abnormalities.     The scope was then withdrawn from the patient and the procedure completed.  COMPLICATIONS: There were no complications. ENDOSCOPIC IMPRESSION: 1.   The mucosa of the esophagus appeared normal ,no esophageal varices 2.   The mucosa of the stomach appeared normal ,no gastric varices or portal hypertensive gastropathy 3.   The duodenal mucosa showed no abnormalities  RECOMMENDATIONS: 1.  Continue current meds 2.  proceed with colonoscopy today  REPEAT EXAM: In 2 year(s)  for EGD .  eSigned:  Lafayette Dragon, MD 06/06/2013 2:32 PM   CC:  PATIENT NAME:  Kimberly, Huff MR#: [id number]

## 2013-06-06 NOTE — Progress Notes (Signed)
Called to room to assist during endoscopic procedure.  Patient ID and intended procedure confirmed with present staff. Received instructions for my participation in the procedure from the performing physician.  

## 2013-06-07 ENCOUNTER — Telehealth: Payer: Self-pay | Admitting: *Deleted

## 2013-06-07 NOTE — Telephone Encounter (Signed)
  Follow up Call-  Call back number 06/06/2013  Post procedure Call Back phone  # [phone]  Permission to leave phone message Yes     Patient questions:  Do you have a fever, pain , or abdominal swelling? no Pain Score  0 *  Have you tolerated food without any problems? yes  Have you been able to return to your normal activities? yes  Do you have any questions about your discharge instructions: Diet   no Medications  no Follow up visit  no  Do you have questions or concerns about your Care? no  Actions: * If pain score is 4 or above: No action needed, pain <4.

## 2013-06-12 ENCOUNTER — Encounter: Payer: Self-pay | Admitting: Internal Medicine

## 2013-06-13 ENCOUNTER — Encounter: Payer: Self-pay | Admitting: *Deleted

## 2013-06-14 ENCOUNTER — Other Ambulatory Visit: Payer: Self-pay | Admitting: Internal Medicine

## 2013-06-14 NOTE — Telephone Encounter (Signed)
Last visit 02/22/13, ok refill?

## 2013-06-15 ENCOUNTER — Other Ambulatory Visit: Payer: Self-pay | Admitting: *Deleted

## 2013-06-15 DIAGNOSIS — E559 Vitamin D deficiency, unspecified: Secondary | ICD-10-CM

## 2013-06-15 NOTE — Telephone Encounter (Signed)
Does she need to continue taking this high dose Vitamin D?

## 2013-06-20 LAB — HM COLONOSCOPY

## 2013-06-20 NOTE — Telephone Encounter (Signed)
No refill on the megadose .,  Resume otc vit d 3 at 2000 units daily for the winter months

## 2013-06-21 ENCOUNTER — Other Ambulatory Visit: Payer: Self-pay | Admitting: *Deleted

## 2013-06-21 DIAGNOSIS — E559 Vitamin D deficiency, unspecified: Secondary | ICD-10-CM

## 2013-06-22 ENCOUNTER — Other Ambulatory Visit: Payer: Self-pay | Admitting: *Deleted

## 2013-06-22 DIAGNOSIS — E559 Vitamin D deficiency, unspecified: Secondary | ICD-10-CM

## 2013-07-14 MED ORDER — ERGOCALCIFEROL 1.25 MG (50000 UT) PO CAPS: 50000.0000 [IU] | ORAL_CAPSULE | ORAL | Status: DC

## 2013-08-27 ENCOUNTER — Other Ambulatory Visit: Payer: Self-pay | Admitting: Internal Medicine

## 2013-08-28 NOTE — Telephone Encounter (Signed)
Last visit 02/22/13

## 2013-09-17 LAB — HM DIABETES EYE EXAM

## 2013-10-02 ENCOUNTER — Other Ambulatory Visit: Payer: Self-pay | Admitting: Internal Medicine

## 2013-10-03 NOTE — Telephone Encounter (Signed)
Sent mychart message on need for appt

## 2013-10-04 ENCOUNTER — Other Ambulatory Visit: Payer: Self-pay | Admitting: *Deleted

## 2013-10-04 MED ORDER — POTASSIUM CHLORIDE ER 10 MEQ PO TBCR: EXTENDED_RELEASE_TABLET | ORAL | Status: DC

## 2013-10-18 ENCOUNTER — Encounter: Payer: Self-pay | Admitting: Internal Medicine

## 2013-10-18 ENCOUNTER — Ambulatory Visit (INDEPENDENT_AMBULATORY_CARE_PROVIDER_SITE_OTHER): Payer: BC Managed Care – PPO | Admitting: Internal Medicine

## 2013-10-18 VITALS — BP 118/68 | HR 90 | Temp 98.1°F | Resp 16

## 2013-10-18 DIAGNOSIS — R5383 Other fatigue: Secondary | ICD-10-CM

## 2013-10-18 DIAGNOSIS — E1169 Type 2 diabetes mellitus with other specified complication: Secondary | ICD-10-CM

## 2013-10-18 DIAGNOSIS — E785 Hyperlipidemia, unspecified: Secondary | ICD-10-CM

## 2013-10-18 DIAGNOSIS — K7581 Nonalcoholic steatohepatitis (NASH): Secondary | ICD-10-CM

## 2013-10-18 DIAGNOSIS — R5381 Other malaise: Secondary | ICD-10-CM

## 2013-10-18 DIAGNOSIS — K7689 Other specified diseases of liver: Secondary | ICD-10-CM

## 2013-10-18 DIAGNOSIS — E669 Obesity, unspecified: Secondary | ICD-10-CM

## 2013-10-18 DIAGNOSIS — E118 Type 2 diabetes mellitus with unspecified complications: Secondary | ICD-10-CM

## 2013-10-18 DIAGNOSIS — M109 Gout, unspecified: Secondary | ICD-10-CM

## 2013-10-18 DIAGNOSIS — E559 Vitamin D deficiency, unspecified: Secondary | ICD-10-CM

## 2013-10-18 LAB — HM DIABETES FOOT EXAM: HM Diabetic Foot Exam: NORMAL

## 2013-10-18 NOTE — Progress Notes (Signed)
Patient ID: Kimberly Huff, female   DOB: [birth year], 68 y.o.   MRN: [id number]   Patient Active Problem List   Diagnosis Date Noted  . Personal history of gastric ulcer 02/23/2013  . Left shoulder pain 02/23/2013  . Routine general medical examination at a health care facility 02/23/2013  . Unspecified vitamin D deficiency 02/23/2013  . Humeral shaft fracture 02/16/2012  . Gouty arthritis 08/25/2011  . Diabetes mellitus with complication 82/50/5397  . Hemorrhoid 05/27/2011  . Obesity (BMI 30-39.9) 02/16/2011  . Dyslipidemia associated with type 2 diabetes mellitus 02/16/2011  . Cataract extraction status 02/16/2011  . Hydronephrosis, left 02/16/2011  . NASH (nonalcoholic steatohepatitis) 02/16/2011    Subjective:  CC:   Chief Complaint  Patient presents with  . Follow-up    medication refills    HPI:   Kimberly Huff is a 68 y.o. female who presents for Follow up on DM Type 2,  Dyslipidemia, NASH, obesity . Last seen for  diabetes follow up in October 2014 Has had screening mammogram and COLONOSCOPY SINCE THEN  Lab Results  Component Value Date   HGBA1C 5.9 12/15/2012   Lab Results  Component Value Date   CHOL 110 12/15/2012   HDL 23* 12/15/2012   LDLCALC 41 12/15/2012   LDLDIRECT 94.1 05/26/2011   TRIG 230* 12/15/2012   CHOLHDL 4 12/29/2011   Her mother had a massive CVA in February at age 49 admitted to [facility] with paralysis on right, was discharged to  [facility] , now living with patient who is still working full time.  Sleep deprivation has improved but still an issue bc mother  has nocturia x 3 .  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  Has Support from family during the weekdays    Past Medical History  Diagnosis Date  . Kidney stone   . Diverticulosis 2005    Dr Jamal Collin  . Coronary artery disease   . Hypertension   . Hyperlipidemia   . Type II or unspecified type diabetes mellitus without mention of complication, uncontrolled   .  Hypopotassemia   . Atrophic kidney     right  . Osteoarthritis   . Coronary atherosclerosis   . Obesity   . Myocardial infarction 2000  . Recurrent UTI   . Steatohepatitis 05/30/10    Brunt Stage 3  . Diabetes mellitus   . Allergy   . Blood transfusion   . Cataract   . Neuromuscular disorder     "achy muscles"  . External hemorrhoids without mention of complication 6734  . Unspecified essential hypertension 2014  . Heart murmur 2012    found by Dr. Jamal Collin  . H/O cardiac catheterization 2005  . Spinal stenosis of cervical region   . Blood transfusion without reported diagnosis   . Clotting disorder     blood clots in abdomen after surgery  . GERD (gastroesophageal reflux disease)   . Ulcer     Past Surgical History  Procedure Laterality Date  . Breast mass excision      benign  . Ureterotomy  1960/1969    x 2 during childhood  . Abdominal hysterectomy  1982    complete  . Ptca  2000  . Vaginal hysterectomy      PT DENIES ABD HYSTERECTOMY  . Foot surgery  2008  . Cardiac stents  2000  . Tonsillectomy    . Salpingoophorectomy    . Colonoscopy  2004  Dr. Jamal Collin  . Eye surgery Bilateral 2012    cataract  . Upper gi endoscopy    . Cataract extraction      both eyes       The following portions of the patient's history were reviewed and updated as appropriate: Allergies, current medications, and problem list.    Review of Systems:   Patient denies headache, fevers, malaise, unintentional weight loss, skin rash, eye pain, sinus congestion and sinus pain, sore throat, dysphagia,  hemoptysis , cough, dyspnea, wheezing, chest pain, palpitations, orthopnea, edema, abdominal pain, nausea, melena, diarrhea, constipation, flank pain, dysuria, hematuria, urinary  Frequency, nocturia, numbness, tingling, seizures,  Focal weakness, Loss of consciousness,  Tremor, insomnia, depression, anxiety, and suicidal ideation.     History   Social History  . Marital Status:  Single    Spouse Name: N/A    Number of Children: 2  . Years of Education: N/A   Occupational History  . Account executive    Social History Main Topics  . Smoking status: Former Smoker    Types: Cigarettes    Quit date: 09/15/1998  . Smokeless tobacco: Never Used  . Alcohol Use: Yes     Comment: very rare  . Drug Use: No  . Sexual Activity: Not on file   Other Topics Concern  . Not on file   Social History Narrative  . No narrative on file    Objective:  Filed Vitals:   10/18/13 1435  BP: 118/68  Pulse: 90  Temp: 98.1 F (36.7 C)  Resp: 16     General appearance: alert, cooperative and appears stated age Ears: normal TM's and external ear canals both ears Throat: lips, mucosa, and tongue normal; teeth and gums normal Neck: no adenopathy, no carotid bruit, supple, symmetrical, trachea midline and thyroid not enlarged, symmetric, no tenderness/mass/nodules Back: symmetric, no curvature. ROM normal. No CVA tenderness. Lungs: clear to auscultation bilaterally Heart: regular rate and rhythm, S1, S2 normal, no murmur, click, rub or gallop Abdomen: soft, non-tender; bowel sounds normal; no masses,  no organomegaly Pulses: 2+ and symmetric Skin: Skin color, texture, turgor normal. No rashes or lesions Lymph nodes: Cervical, supraclavicular, and axillary nodes normal.  Assessment and Plan:  NASH (nonalcoholic steatohepatitis) She remains asymptomatic with good synthetic function.  Diabetes and dyslipidemia  Have been addressed.  Weight loss has been an issue. I have addressed  BMI and recommended wt loss of 10% of body weigh over the next 6 months using a low glycemic index diet and regular exercise a minimum of 5 days per week.    Dyslipidemia associated with type 2 diabetes mellitus Managed with simvasatin .  Fasting liids, due  Will consider higher potency statin if LDL is > 100  Diabetes mellitus with complication M5H was 5.9 last year at outside facility ([facility]  August 2014) and she has again been lost to follow up for one year. Her DM is well-controlled on metformin alone She is not up-to-date on eye exams and his foot exam is normal. l we'll repeat his urine microalbumin to creatinine ratio at next visit. She is on the appropriate medications.    Obesity (BMI 30-39.9) I have addressed  BMI and recommended wt loss of 10% of body weigh over the next 6 months using a low glycemic index diet and regular exercise a minimum of 5 days per week.     Updated Medication List Outpatient Encounter Prescriptions as of 10/18/2013  Medication Sig  . aspirin 325 MG  tablet Take by mouth.  . ergocalciferol (DRISDOL) 50000 UNITS capsule Take 1 capsule (50,000 Units total) by mouth once a week.  . furosemide (LASIX) 40 MG tablet TAKE 1 TABLET BY MOUTH EVERY DAY  . ibuprofen (ADVIL,MOTRIN) 800 MG tablet TAKE 1 TABLET BY MOUTH 3 TIMES A DAY AS NEEDED  . losartan (COZAAR) 50 MG tablet TAKE 1 TABLET EVERY DAY  . metFORMIN (GLUCOPHAGE) 500 MG tablet Take one tablet by mouth twice daily  . omeprazole (PRILOSEC) 40 MG capsule TAKE ONE CAPSULE BY MOUTH DAILY  . potassium chloride (K-DUR) 10 MEQ tablet TAKE 1 TABLET BY MOUTH EVERY DAY  . simvastatin (ZOCOR) 40 MG tablet TAKE 1 TABLET BY MOUTH AT BEDTIME  . tiZANidine (ZANAFLEX) 4 MG capsule Take 1 capsule (4 mg total) by mouth 3 (three) times daily.     Orders Placed This Encounter  Procedures  . HM MAMMOGRAPHY  . Hemoglobin A1c  . Lipid panel  . Microalbumin / creatinine urine ratio  . Comprehensive metabolic panel  . TSH  . CBC with Differential  . Uric acid  . HM DIABETES EYE EXAM  . HM DIABETES FOOT EXAM  . HM COLONOSCOPY    Return in about 3 months (around 01/18/2014) for follow up diabetes.

## 2013-10-18 NOTE — Progress Notes (Signed)
Pre-visit discussion using our clinic review tool. No additional management support is needed unless otherwise documented below in the visit note.  

## 2013-10-20 ENCOUNTER — Encounter: Payer: Self-pay | Admitting: Internal Medicine

## 2013-10-20 DIAGNOSIS — R5381 Other malaise: Secondary | ICD-10-CM

## 2013-10-20 DIAGNOSIS — D510 Vitamin B12 deficiency anemia due to intrinsic factor deficiency: Secondary | ICD-10-CM

## 2013-10-20 DIAGNOSIS — R5383 Other fatigue: Principal | ICD-10-CM

## 2013-10-21 ENCOUNTER — Encounter: Payer: Self-pay | Admitting: Internal Medicine

## 2013-10-21 NOTE — Assessment & Plan Note (Signed)
She remains asymptomatic with good synthetic function.  Diabetes and dyslipidemia  Have been addressed.  Weight loss has been an issue. I have addressed  BMI and recommended wt loss of 10% of body weigh over the next 6 months using a low glycemic index diet and regular exercise a minimum of 5 days per week.

## 2013-10-21 NOTE — Assessment & Plan Note (Signed)
I have addressed  BMI and recommended wt loss of 10% of body weigh over the next 6 months using a low glycemic index diet and regular exercise a minimum of 5 days per week.   

## 2013-10-21 NOTE — Assessment & Plan Note (Signed)
Managed with simvasatin .  Fasting liids, due  Will consider higher potency statin if LDL is > 100

## 2013-10-21 NOTE — Assessment & Plan Note (Signed)
a1c was 5.9 last year at outside facility ([facility] August 2014) and she has again been lost to follow up for one year. Her DM is well-controlled on metformin alone She is not up-to-date on eye exams and his foot exam is normal. l we'll repeat his urine microalbumin to creatinine ratio at next visit. She is on the appropriate medications.

## 2013-10-25 ENCOUNTER — Ambulatory Visit (INDEPENDENT_AMBULATORY_CARE_PROVIDER_SITE_OTHER): Payer: BC Managed Care – PPO | Admitting: *Deleted

## 2013-10-25 ENCOUNTER — Other Ambulatory Visit (INDEPENDENT_AMBULATORY_CARE_PROVIDER_SITE_OTHER): Payer: BC Managed Care – PPO

## 2013-10-25 DIAGNOSIS — R5381 Other malaise: Secondary | ICD-10-CM

## 2013-10-25 DIAGNOSIS — E1169 Type 2 diabetes mellitus with other specified complication: Secondary | ICD-10-CM

## 2013-10-25 DIAGNOSIS — D510 Vitamin B12 deficiency anemia due to intrinsic factor deficiency: Secondary | ICD-10-CM

## 2013-10-25 DIAGNOSIS — E118 Type 2 diabetes mellitus with unspecified complications: Secondary | ICD-10-CM

## 2013-10-25 DIAGNOSIS — K7581 Nonalcoholic steatohepatitis (NASH): Secondary | ICD-10-CM

## 2013-10-25 DIAGNOSIS — K7689 Other specified diseases of liver: Secondary | ICD-10-CM

## 2013-10-25 DIAGNOSIS — E538 Deficiency of other specified B group vitamins: Secondary | ICD-10-CM

## 2013-10-25 DIAGNOSIS — E785 Hyperlipidemia, unspecified: Secondary | ICD-10-CM

## 2013-10-25 DIAGNOSIS — R5383 Other fatigue: Secondary | ICD-10-CM

## 2013-10-25 DIAGNOSIS — M109 Gout, unspecified: Secondary | ICD-10-CM

## 2013-10-25 LAB — LIPID PANEL
Cholesterol: 131 mg/dL (ref 0–200)
HDL: 24 mg/dL — ABNORMAL LOW (ref >39.00)
LDL Cholesterol: 67 mg/dL (ref 0–99)
NonHDL: 107
Total CHOL/HDL Ratio: 5
Triglycerides: 198 mg/dL — ABNORMAL HIGH (ref 0.0–149.0)
VLDL: 39.6 mg/dL (ref 0.0–40.0)

## 2013-10-25 LAB — MICROALBUMIN / CREATININE URINE RATIO
Creatinine,U: 138.1 mg/dL
Microalb Creat Ratio: 3.3 mg/g (ref 0.0–30.0)
Microalb, Ur: 4.6 mg/dL — ABNORMAL HIGH (ref 0.0–1.9)

## 2013-10-25 LAB — CBC WITH DIFFERENTIAL/PLATELET
Basophils Absolute: 0 10*3/uL (ref 0.0–0.1)
Basophils Relative: 0.8 % (ref 0.0–3.0)
Eosinophils Absolute: 0.2 10*3/uL (ref 0.0–0.7)
Eosinophils Relative: 3.7 % (ref 0.0–5.0)
HCT: 39.4 % (ref 36.0–46.0)
Hemoglobin: 13.2 g/dL (ref 12.0–15.0)
Lymphocytes Relative: 43 % (ref 12.0–46.0)
Lymphs Abs: 2.1 10*3/uL (ref 0.7–4.0)
MCHC: 33.5 g/dL (ref 30.0–36.0)
MCV: 85.8 fl (ref 78.0–100.0)
Monocytes Absolute: 0.3 10*3/uL (ref 0.1–1.0)
Monocytes Relative: 6.8 % (ref 3.0–12.0)
Neutro Abs: 2.3 10*3/uL (ref 1.4–7.7)
Neutrophils Relative %: 45.7 % (ref 43.0–77.0)
Platelets: 141 10*3/uL — ABNORMAL LOW (ref 150.0–400.0)
RBC: 4.59 Mil/uL (ref 3.87–5.11)
RDW: 12.9 % (ref 11.5–15.5)
WBC: 5 10*3/uL (ref 4.0–10.5)

## 2013-10-25 LAB — COMPREHENSIVE METABOLIC PANEL
ALT: 82 U/L — ABNORMAL HIGH (ref 0–35)
AST: 60 U/L — ABNORMAL HIGH (ref 0–37)
Albumin: 4.2 g/dL (ref 3.5–5.2)
Alkaline Phosphatase: 60 U/L (ref 39–117)
BUN: 16 mg/dL (ref 6–23)
CO2: 27 mEq/L (ref 19–32)
Calcium: 9.5 mg/dL (ref 8.4–10.5)
Chloride: 105 mEq/L (ref 96–112)
Creatinine, Ser: 0.6 mg/dL (ref 0.4–1.2)
GFR: 107.05 mL/min (ref >60.00)
Glucose, Bld: 168 mg/dL — ABNORMAL HIGH (ref 70–99)
Potassium: 4.6 mEq/L (ref 3.5–5.1)
Sodium: 138 mEq/L (ref 135–145)
Total Bilirubin: 0.7 mg/dL (ref 0.2–1.2)
Total Protein: 6.5 g/dL (ref 6.0–8.3)

## 2013-10-25 LAB — URIC ACID: Uric Acid, Serum: 8 mg/dL — ABNORMAL HIGH (ref 2.4–7.0)

## 2013-10-25 LAB — TSH: TSH: 0.83 u[IU]/mL (ref 0.35–4.50)

## 2013-10-25 LAB — VITAMIN B12: Vitamin B-12: 342 pg/mL (ref 211–911)

## 2013-10-25 LAB — HEMOGLOBIN A1C: Hgb A1c MFr Bld: 6.9 % — ABNORMAL HIGH (ref 4.6–6.5)

## 2013-10-25 MED ORDER — CYANOCOBALAMIN 1000 MCG/ML IJ SOLN
1000.0000 ug | Freq: Once | INTRAMUSCULAR | Status: AC
  Administered 2013-10-25: 1000 ug via INTRAMUSCULAR

## 2013-10-26 ENCOUNTER — Encounter: Payer: Self-pay | Admitting: Internal Medicine

## 2013-11-02 ENCOUNTER — Other Ambulatory Visit: Payer: Self-pay | Admitting: Internal Medicine

## 2013-11-02 NOTE — Telephone Encounter (Signed)
Last refill 6.1.15, last OV 6.17.15, future OV 9.17.15.  Please advise refill.

## 2013-11-03 NOTE — Telephone Encounter (Signed)
Ok to refill,  Refill sent  

## 2013-11-05 ENCOUNTER — Other Ambulatory Visit: Payer: Self-pay | Admitting: Internal Medicine

## 2013-11-07 ENCOUNTER — Other Ambulatory Visit: Payer: Self-pay | Admitting: *Deleted

## 2013-11-07 MED ORDER — POTASSIUM CHLORIDE ER 10 MEQ PO TBCR: EXTENDED_RELEASE_TABLET | ORAL | Status: DC

## 2013-11-20 ENCOUNTER — Other Ambulatory Visit: Payer: Self-pay | Admitting: Internal Medicine

## 2013-11-20 NOTE — Telephone Encounter (Signed)
Refill Vitamin D? Last checked 02/22/13

## 2013-11-22 ENCOUNTER — Other Ambulatory Visit: Payer: Self-pay | Admitting: Internal Medicine

## 2013-11-28 ENCOUNTER — Other Ambulatory Visit: Payer: Self-pay | Admitting: Internal Medicine

## 2013-11-29 ENCOUNTER — Other Ambulatory Visit: Payer: Self-pay | Admitting: Internal Medicine

## 2013-12-27 ENCOUNTER — Other Ambulatory Visit: Payer: Self-pay | Admitting: Internal Medicine

## 2013-12-27 NOTE — Telephone Encounter (Signed)
Ok to refill,  Refill sent  

## 2013-12-27 NOTE — Telephone Encounter (Signed)
Last refill 7.22.15, last OV 6.17.15.  Please advise refill.

## 2014-01-18 ENCOUNTER — Ambulatory Visit: Payer: BC Managed Care – PPO | Admitting: Internal Medicine

## 2014-01-28 ENCOUNTER — Other Ambulatory Visit: Payer: Self-pay | Admitting: Internal Medicine

## 2014-02-04 ENCOUNTER — Other Ambulatory Visit: Payer: Self-pay | Admitting: Internal Medicine

## 2014-03-05 ENCOUNTER — Encounter: Payer: Self-pay | Admitting: Internal Medicine

## 2014-03-07 ENCOUNTER — Ambulatory Visit (INDEPENDENT_AMBULATORY_CARE_PROVIDER_SITE_OTHER): Payer: BC Managed Care – PPO | Admitting: *Deleted

## 2014-03-07 ENCOUNTER — Ambulatory Visit (INDEPENDENT_AMBULATORY_CARE_PROVIDER_SITE_OTHER): Payer: BC Managed Care – PPO | Admitting: Internal Medicine

## 2014-03-07 ENCOUNTER — Encounter: Payer: Self-pay | Admitting: Internal Medicine

## 2014-03-07 VITALS — BP 128/76 | HR 75 | Temp 98.9°F | Resp 16 | Ht 63.5 in | Wt 200.0 lb

## 2014-03-07 DIAGNOSIS — Z23 Encounter for immunization: Secondary | ICD-10-CM

## 2014-03-07 DIAGNOSIS — E118 Type 2 diabetes mellitus with unspecified complications: Secondary | ICD-10-CM

## 2014-03-07 DIAGNOSIS — E669 Obesity, unspecified: Secondary | ICD-10-CM

## 2014-03-07 DIAGNOSIS — E559 Vitamin D deficiency, unspecified: Secondary | ICD-10-CM

## 2014-03-07 DIAGNOSIS — E1169 Type 2 diabetes mellitus with other specified complication: Secondary | ICD-10-CM

## 2014-03-07 DIAGNOSIS — E785 Hyperlipidemia, unspecified: Secondary | ICD-10-CM

## 2014-03-07 DIAGNOSIS — E119 Type 2 diabetes mellitus without complications: Secondary | ICD-10-CM

## 2014-03-07 DIAGNOSIS — E46 Unspecified protein-calorie malnutrition: Secondary | ICD-10-CM

## 2014-03-07 DIAGNOSIS — K7581 Nonalcoholic steatohepatitis (NASH): Secondary | ICD-10-CM

## 2014-03-07 DIAGNOSIS — E139 Other specified diabetes mellitus without complications: Secondary | ICD-10-CM

## 2014-03-07 LAB — COMPREHENSIVE METABOLIC PANEL WITH GFR
ALT: 92 U/L — ABNORMAL HIGH (ref 0–35)
AST: 94 U/L — ABNORMAL HIGH (ref 0–37)
Albumin: 3.6 g/dL (ref 3.5–5.2)
Alkaline Phosphatase: 56 U/L (ref 39–117)
BUN: 16 mg/dL (ref 6–23)
CO2: 25 meq/L (ref 19–32)
Calcium: 9.7 mg/dL (ref 8.4–10.5)
Chloride: 104 meq/L (ref 96–112)
Creatinine, Ser: 0.7 mg/dL (ref 0.4–1.2)
GFR: 93.96 mL/min
Glucose, Bld: 139 mg/dL — ABNORMAL HIGH (ref 70–99)
Potassium: 4.3 meq/L (ref 3.5–5.1)
Sodium: 139 meq/L (ref 135–145)
Total Bilirubin: 0.8 mg/dL (ref 0.2–1.2)
Total Protein: 6.9 g/dL (ref 6.0–8.3)

## 2014-03-07 LAB — MICROALBUMIN / CREATININE URINE RATIO
Creatinine,U: 91.7 mg/dL
Microalb Creat Ratio: 9.6 mg/g (ref 0.0–30.0)
Microalb, Ur: 8.8 mg/dL — ABNORMAL HIGH (ref 0.0–1.9)

## 2014-03-07 LAB — HEMOGLOBIN A1C: Hgb A1c MFr Bld: 7 % — ABNORMAL HIGH (ref 4.6–6.5)

## 2014-03-07 LAB — HM DIABETES FOOT EXAM: HM Diabetic Foot Exam: NORMAL

## 2014-03-07 MED ORDER — METFORMIN HCL ER 750 MG PO TB24: 750.0000 mg | ORAL_TABLET | Freq: Every day | ORAL | Status: DC

## 2014-03-07 NOTE — Progress Notes (Signed)
Pre-visit discussion using our clinic review tool. No additional management support is needed unless otherwise documented below in the visit note.  

## 2014-03-07 NOTE — Patient Instructions (Signed)
Diabetes and Exercise Exercising regularly is important. It is not just about losing weight. It has many health benefits, such as:  Improving your overall fitness, flexibility, and endurance.  Increasing your bone density.  Helping with weight control.  Decreasing your body fat.  Increasing your muscle strength.  Reducing stress and tension.  Improving your overall health. People with diabetes who exercise gain additional benefits because exercise:  Reduces appetite.  Improves the body's use of blood sugar (glucose).  Helps lower or control blood glucose.  Decreases blood pressure.  Helps control blood lipids (such as cholesterol and triglycerides).  Improves the body's use of the hormone insulin by:  Increasing the body's insulin sensitivity.  Reducing the body's insulin needs.  Decreases the risk for heart disease because exercising:  Lowers cholesterol and triglycerides levels.  Increases the levels of good cholesterol (such as high-density lipoproteins [HDL]) in the body.  Lowers blood glucose levels. YOUR ACTIVITY PLAN  Choose an activity that you enjoy and set realistic goals. Your health care provider or diabetes educator can help you make an activity plan that works for you. Exercise regularly as directed by your health care provider. This includes:  Performing resistance training twice a week such as push-ups, sit-ups, lifting weights, or using resistance bands.  Performing 150 minutes of cardio exercises each week such as walking, running, or playing sports.  Staying active and spending no more than 90 minutes at one time being inactive. Even short bursts of exercise are good for you. Three 10-minute sessions spread throughout the day are just as beneficial as a single 30-minute session. Some exercise ideas include:  Taking the dog for a walk.  Taking the stairs instead of the elevator.  Dancing to your favorite song.  Doing an exercise  video.  Doing your favorite exercise with a friend. RECOMMENDATIONS FOR EXERCISING WITH TYPE 1 OR TYPE 2 DIABETES   Check your blood glucose before exercising. If blood glucose levels are greater than 240 mg/dL, check for urine ketones. Do not exercise if ketones are present.  Avoid injecting insulin into areas of the body that are going to be exercised. For example, avoid injecting insulin into:  The arms when playing tennis.  The legs when jogging.  Keep a record of:  Food intake before and after you exercise.  Expected peak times of insulin action.  Blood glucose levels before and after you exercise.  The type and amount of exercise you have done.  Review your records with your health care provider. Your health care provider will help you to develop guidelines for adjusting food intake and insulin amounts before and after exercising.  If you take insulin or oral hypoglycemic agents, watch for signs and symptoms of hypoglycemia. They include:  Dizziness.  Shaking.  Sweating.  Chills.  Confusion.  Drink plenty of water while you exercise to prevent dehydration or heat stroke. Body water is lost during exercise and must be replaced.  Talk to your health care provider before starting an exercise program to make sure it is safe for you. Remember, almost any type of activity is better than none. Document Released: 07/11/2003 Document Revised: 09/04/2013 Document Reviewed: 09/27/2012 Exit[employer] Patient Information 2015 [employer]. This information is not intended to replace advice given to you by your health care provider. Make sure you discuss any questions you have with your health care provider.  

## 2014-03-07 NOTE — Progress Notes (Signed)
Patient ID: Kimberly Huff, female   DOB: 01/24/1946, 68 y.o.   MRN: [id number]   Patient Active Problem List   Diagnosis Date Noted  . Personal history of gastric ulcer 02/23/2013  . Left shoulder pain 02/23/2013  . Routine general medical examination at a health care facility 02/23/2013  . Vitamin D deficiency 02/23/2013  . Humeral shaft fracture 02/16/2012  . Gouty arthritis 08/25/2011  . DM (diabetes mellitus) with complications 40/81/4481  . Hemorrhoid 05/27/2011  . Obesity (BMI 30-39.9) 02/16/2011  . Dyslipidemia associated with type 2 diabetes mellitus 02/16/2011  . Cataract extraction status 02/16/2011  . Hydronephrosis, left 02/16/2011  . NASH (nonalcoholic steatohepatitis) 02/16/2011    Subjective:  CC:   Chief Complaint  Patient presents with  . Follow-up  . Diabetes    HPI:   Kimberly Huff is a 68 y.o. female who presents for  Follow up on type 2 DM, NASH, obesity and hyperlipidemia.  She has not been following  Low GI diet lately due to family illnesses.  She was staying with her daughter in [place] for several weeks.   Her daughter was hospitalized recently for neurosurgical correction of an  Roselie Awkward Chiari malformation .  Currently her aging mother is living with patient and there has been some conflict.  Patient continues to work to avoid assuming full time care of mother.      Past Medical History  Diagnosis Date  . Kidney stone   . Diverticulosis 2005    Dr Jamal Collin  . Coronary artery disease   . Hypertension   . Hyperlipidemia   . Type II or unspecified type diabetes mellitus without mention of complication, uncontrolled   . Hypopotassemia   . Atrophic kidney     right  . Osteoarthritis   . Coronary atherosclerosis   . Obesity   . Myocardial infarction 2000  . Recurrent UTI   . Steatohepatitis 05/30/10    Brunt Stage 3  . Diabetes mellitus   . Allergy   . Blood transfusion   . Cataract   . Neuromuscular disorder     "achy muscles"  . External  hemorrhoids without mention of complication 8563  . Unspecified essential hypertension 2014  . Heart murmur 2012    found by Dr. Jamal Collin  . H/O cardiac catheterization 2005  . Spinal stenosis of cervical region   . Blood transfusion without reported diagnosis   . Clotting disorder     blood clots in abdomen after surgery  . GERD (gastroesophageal reflux disease)   . Ulcer     Past Surgical History  Procedure Laterality Date  . Breast mass excision      benign  . Ureterotomy  1960/1969    x 2 during childhood  . Abdominal hysterectomy  1982    complete  . Ptca  2000  . Vaginal hysterectomy      PT DENIES ABD HYSTERECTOMY  . Foot surgery  2008  . Cardiac stents  2000  . Tonsillectomy    . Salpingoophorectomy    . Colonoscopy  2004    Dr. Jamal Collin  . Eye surgery Bilateral 2012    cataract  . Upper gi endoscopy    . Cataract extraction      both eyes       The following portions of the patient's history were reviewed and updated as appropriate: Allergies, current medications, and problem list.    Review of Systems:   Patient denies headache, fevers, malaise, unintentional weight loss,  skin rash, eye pain, sinus congestion and sinus pain, sore throat, dysphagia,  hemoptysis , cough, dyspnea, wheezing, chest pain, palpitations, orthopnea, edema, abdominal pain, nausea, melena, diarrhea, constipation, flank pain, dysuria, hematuria, urinary  Frequency, nocturia, numbness, tingling, seizures,  Focal weakness, Loss of consciousness,  Tremor, insomnia, depression, anxiety, and suicidal ideation.     History   Social History  . Marital Status: Single    Spouse Name: N/A    Number of Children: 2  . Years of Education: N/A   Occupational History  . Account executive    Social History Main Topics  . Smoking status: Former Smoker    Types: Cigarettes    Quit date: 09/15/1998  . Smokeless tobacco: Never Used  . Alcohol Use: Yes     Comment: very rare  . Drug Use: No   . Sexual Activity: Not on file   Other Topics Concern  . Not on file   Social History Narrative    Objective:  Filed Vitals:   03/07/14 0805  BP: 128/76  Pulse: 75  Temp: 98.9 F (37.2 C)  Resp: 16     General appearance: alert, cooperative and appears stated age Ears: normal TM's and external ear canals both ears Throat: lips, mucosa, and tongue normal; teeth and gums normal Neck: no adenopathy, no carotid bruit, supple, symmetrical, trachea midline and thyroid not enlarged, symmetric, no tenderness/mass/nodules Back: symmetric, no curvature. ROM normal. No CVA tenderness. Lungs: clear to auscultation bilaterally Heart: regular rate and rhythm, S1, S2 normal, no murmur, click, rub or gallop Abdomen: soft, non-tender; bowel sounds normal; no masses,  no organomegaly Pulses: 2+ and symmetric Skin: Skin color, texture, turgor normal. No rashes or lesions Lymph nodes: Cervical, supraclavicular, and axillary nodes normal.  Assessment and Plan:  NASH (nonalcoholic steatohepatitis) Confirmed with biopsy 2012,.  Up to date on vaccines.  Taking statin for management of hyperlipidemia.  Diabetes and obesity addressed with medications and Low GI diet . LFTS remain elevated.   Lab Results  Component Value Date   ALT 92* 03/07/2014   AST 94* 03/07/2014   ALKPHOS 56 03/07/2014   BILITOT 0.8 03/07/2014     Dyslipidemia associated with type 2 diabetes mellitus Managed with simvasatin .  Continue current medication since her LDL is < 100.Katherene Ponto addressed with diet and exercise.   Lab Results  Component Value Date   CHOL 131 10/25/2013   HDL 24.00* 10/25/2013   LDLCALC 67 10/25/2013   LDLDIRECT 94.1 05/26/2011   TRIG 198.0* 10/25/2013   CHOLHDL 5 10/25/2013       DM (diabetes mellitus) with complications He has mild proteinuria, NASH and dyslipidemia.  Historically well-controlled on current medications.  hemoglobin A1c has been consistently at or  less than 7.0 .  Patient is up-to-date on eye exams and foot exam is normal today. Patient is due for urine microalbumin to creatinine ratio at next visit. Patient is tolerating statin therapy for CAD risk reduction and on ACE/ARB for reduction in proteinuria.  Lab Results  Component Value Date   HGBA1C 7.0* 03/07/2014   Lab Results  Component Value Date   CHOL 131 10/25/2013   HDL 24.00* 10/25/2013   LDLCALC 67 10/25/2013   LDLDIRECT 94.1 05/26/2011   TRIG 198.0* 10/25/2013   CHOLHDL 5 10/25/2013   Lab Results  Component Value Date   MICROALBUR 8.8* 03/07/2014     Vitamin D deficiency Well controlled on current regimen. Renal function stable, no changes today.   Updated  Medication List Outpatient Encounter Prescriptions as of 03/07/2014  Medication Sig  . aspirin 325 MG tablet Take by mouth.  . furosemide (LASIX) 40 MG tablet TAKE 1 TABLET BY MOUTH EVERY DAY  . furosemide (LASIX) 40 MG tablet TAKE 1 TABLET BY MOUTH EVERY DAY  . losartan (COZAAR) 50 MG tablet TAKE 1 TABLET EVERY DAY  . omeprazole (PRILOSEC) 40 MG capsule TAKE ONE CAPSULE BY MOUTH DAILY  . potassium chloride (K-DUR) 10 MEQ tablet TAKE 1 TABLET BY MOUTH EVERY DAY  . simvastatin (ZOCOR) 40 MG tablet TAKE 1 TABLET BY MOUTH AT BEDTIME  . Vitamin D, Ergocalciferol, (DRISDOL) 50000 UNITS CAPS capsule TAKE 1 CAPSULE (50,000 UNITS TOTAL) BY MOUTH ONCE A WEEK.  . [DISCONTINUED] ibuprofen (ADVIL,MOTRIN) 800 MG tablet TAKE 1 TABLET BY MOUTH 3 TIMES A DAY AS NEEDED  . [DISCONTINUED] metFORMIN (GLUCOPHAGE) 500 MG tablet TAKE ONE TABLET BY MOUTH TWICE DAILY   . metFORMIN (GLUCOPHAGE-XR) 750 MG 24 hr tablet Take 1 tablet (750 mg total) by mouth daily with breakfast.  . tiZANidine (ZANAFLEX) 4 MG capsule Take 1 capsule (4 mg total) by mouth 3 (three) times daily.  . [DISCONTINUED] simvastatin (ZOCOR) 40 MG tablet TAKE 1 TABLET BY MOUTH AT BEDTIME -- NEEDS APPT FOR ANY MORE REFILLS  . [DISCONTINUED] simvastatin (ZOCOR) 40 MG tablet Take 1  tablet (40 mg total) by mouth daily at 6 PM.     Orders Placed This Encounter  Procedures  . Pneumococcal conjugate vaccine 13-valent  . Hemoglobin A1c  . Microalbumin / creatinine urine ratio  . Comprehensive metabolic panel  . Vit D  25 hydroxy (rtn osteoporosis monitoring)    No Follow-up on file.

## 2014-03-08 ENCOUNTER — Other Ambulatory Visit: Payer: Self-pay | Admitting: Internal Medicine

## 2014-03-09 LAB — VITAMIN D 25 HYDROXY (VIT D DEFICIENCY, FRACTURES): VITD: 47.69 ng/mL (ref 30.00–100.00)

## 2014-03-09 NOTE — Assessment & Plan Note (Signed)
Well controlled on current regimen. Renal function stable, no changes today. 

## 2014-03-09 NOTE — Assessment & Plan Note (Signed)
He has mild proteinuria, NASH and dyslipidemia.  Historically well-controlled on current medications.  hemoglobin A1c has been consistently at or  less than 7.0 . Patient is up-to-date on eye exams and foot exam is normal today. Patient is due for urine microalbumin to creatinine ratio at next visit. Patient is tolerating statin therapy for CAD risk reduction and on ACE/ARB for reduction in proteinuria.  Lab Results  Component Value Date   HGBA1C 7.0* 03/07/2014   Lab Results  Component Value Date   CHOL 131 10/25/2013   HDL 24.00* 10/25/2013   LDLCALC 67 10/25/2013   LDLDIRECT 94.1 05/26/2011   TRIG 198.0* 10/25/2013   CHOLHDL 5 10/25/2013   Lab Results  Component Value Date   MICROALBUR 8.8* 03/07/2014

## 2014-03-09 NOTE — Assessment & Plan Note (Signed)
I have addressed  BMI and discussed a low glycemic index diet utilizing smaller more frequent meals to increase metabolism.  I have also recommended that patient start exercising with a goal of 30 minutes of aerobic exercise a minimum of 5 days per week. Screening for lipid disorders, thyroid and diabetes to be done today.

## 2014-03-09 NOTE — Assessment & Plan Note (Signed)
Confirmed with biopsy 2012,.  Up to date on vaccines.  Taking statin for management of hyperlipidemia.  Diabetes and obesity addressed with medications and Low GI diet . LFTS remain elevated.   Lab Results  Component Value Date   ALT 92* 03/07/2014   AST 94* 03/07/2014   ALKPHOS 56 03/07/2014   BILITOT 0.8 03/07/2014

## 2014-03-09 NOTE — Assessment & Plan Note (Signed)
Managed with simvasatin .  Continue current medication since her LDL is < 100.Katherene Ponto addressed with diet and exercise.   Lab Results  Component Value Date   CHOL 131 10/25/2013   HDL 24.00* 10/25/2013   LDLCALC 67 10/25/2013   LDLDIRECT 94.1 05/26/2011   TRIG 198.0* 10/25/2013   CHOLHDL 5 10/25/2013

## 2014-03-11 ENCOUNTER — Encounter: Payer: Self-pay | Admitting: Internal Medicine

## 2014-03-15 ENCOUNTER — Other Ambulatory Visit: Payer: Self-pay | Admitting: Internal Medicine

## 2014-03-19 ENCOUNTER — Other Ambulatory Visit: Payer: Self-pay | Admitting: Internal Medicine

## 2014-03-19 NOTE — Telephone Encounter (Signed)
Vitamin D checked 03/07/14, continue Rx or OTC?

## 2014-04-02 ENCOUNTER — Other Ambulatory Visit: Payer: Self-pay | Admitting: Internal Medicine

## 2014-04-02 ENCOUNTER — Encounter: Payer: Self-pay | Admitting: Internal Medicine

## 2014-04-02 NOTE — Telephone Encounter (Signed)
No longer needed .  Vit D level is normal.  Continue 1000 units daily of OTC D3

## 2014-04-02 NOTE — Telephone Encounter (Signed)
Pt notified via mychart

## 2014-04-02 NOTE — Telephone Encounter (Signed)
Vitam D checked 03/07/14, continue Rx Vitamin D?

## 2014-04-22 ENCOUNTER — Other Ambulatory Visit: Payer: Self-pay | Admitting: Internal Medicine

## 2014-04-23 NOTE — Telephone Encounter (Signed)
Ok refill , last OV 03/17/14

## 2014-04-24 NOTE — Telephone Encounter (Signed)
Ok to refill,  Refill sent  

## 2014-05-02 LAB — HM DIABETES EYE EXAM

## 2014-05-21 ENCOUNTER — Ambulatory Visit: Payer: Self-pay | Admitting: General Surgery

## 2014-05-21 ENCOUNTER — Encounter: Payer: Self-pay | Admitting: General Surgery

## 2014-05-28 ENCOUNTER — Ambulatory Visit: Payer: 59 | Admitting: General Surgery

## 2014-05-28 ENCOUNTER — Other Ambulatory Visit: Payer: Self-pay

## 2014-05-28 MED ORDER — OMEPRAZOLE 40 MG PO CPDR: 40.0000 mg | DELAYED_RELEASE_CAPSULE | Freq: Every day | ORAL | Status: DC

## 2014-06-03 ENCOUNTER — Other Ambulatory Visit: Payer: Self-pay | Admitting: Internal Medicine

## 2014-06-06 ENCOUNTER — Encounter: Payer: Self-pay | Admitting: Nurse Practitioner

## 2014-06-06 ENCOUNTER — Ambulatory Visit (INDEPENDENT_AMBULATORY_CARE_PROVIDER_SITE_OTHER): Payer: BLUE CROSS/BLUE SHIELD | Admitting: Nurse Practitioner

## 2014-06-06 VITALS — BP 126/78 | HR 103 | Temp 101.1°F | Resp 14 | Ht 63.5 in

## 2014-06-06 DIAGNOSIS — N390 Urinary tract infection, site not specified: Secondary | ICD-10-CM

## 2014-06-06 DIAGNOSIS — R319 Hematuria, unspecified: Secondary | ICD-10-CM

## 2014-06-06 DIAGNOSIS — IMO0001 Reserved for inherently not codable concepts without codable children: Secondary | ICD-10-CM

## 2014-06-06 DIAGNOSIS — M791 Myalgia: Secondary | ICD-10-CM

## 2014-06-06 DIAGNOSIS — M609 Myositis, unspecified: Secondary | ICD-10-CM

## 2014-06-06 LAB — POCT INFLUENZA A/B
Influenza A, POC: NEGATIVE
Influenza B, POC: NEGATIVE

## 2014-06-06 LAB — POCT URINALYSIS DIPSTICK
Bilirubin, UA: NEGATIVE
Glucose, UA: NEGATIVE
Ketones, UA: NEGATIVE
Nitrite, UA: POSITIVE
Protein, UA: 100
Spec Grav, UA: 1.01
Urobilinogen, UA: 2
pH, UA: 6.5

## 2014-06-06 MED ORDER — CIPROFLOXACIN HCL 500 MG PO TABS: 500.0000 mg | ORAL_TABLET | Freq: Two times a day (BID) | ORAL | Status: DC

## 2014-06-06 MED ORDER — ONDANSETRON HCL 4 MG PO TABS: 4.0000 mg | ORAL_TABLET | Freq: Three times a day (TID) | ORAL | Status: DC | PRN

## 2014-06-06 NOTE — Patient Instructions (Addendum)
Do not take the muscle relaxer with the antibiotic.  LOTS of fluids!   Cipro 1 pill twice a day for 10 days.   Zofran as needed for nausea.

## 2014-06-06 NOTE — Progress Notes (Signed)
Pre visit review using our clinic review tool, if applicable. No additional management support is needed unless otherwise documented below in the visit note. 

## 2014-06-06 NOTE — Progress Notes (Signed)
   Subjective:    Patient ID: Kimberly Huff, female    DOB: 26-Oct-1945, 69 y.o.   MRN: [id number]  HPI  [name] is a 69 yo female with a CC of urinary frequency, flank pain, chills, and fever x 4 days.   1) Sunday had a headache, when she doesn't usually have a headache, yesterday 100.1 yesterday- ASA yesterday. 800 mg ibuprofen- helpful   Review of Systems  Constitutional: Positive for fever.  Cardiovascular: Negative for chest pain, palpitations and leg swelling.  Gastrointestinal: Positive for nausea. Negative for vomiting and diarrhea.  Genitourinary: Positive for dysuria, urgency, frequency and flank pain. Negative for hematuria.  Musculoskeletal: Positive for myalgias.  Skin: Negative for rash.      Objective:   Physical Exam  Constitutional: She is oriented to person, place, and time. She appears well-developed and well-nourished. No distress.  BP 126/78 mmHg  Pulse 103  Temp(Src) 101.1 F (38.4 C) (Oral)  Resp 14  Ht 5' 3.5" (1.613 m)  Wt   SpO2 95% 88 pulse repeat  HENT:  Head: Normocephalic and atraumatic.  Right Ear: External ear normal.  Left Ear: External ear normal.  Cardiovascular: Normal rate, regular rhythm, normal heart sounds and intact distal pulses.  Exam reveals no gallop and no friction rub.   No murmur heard. Pulmonary/Chest: Effort normal and breath sounds normal. No respiratory distress. She has no wheezes. She has no rales. She exhibits no tenderness.  Abdominal: There is CVA tenderness.  Neurological: She is alert and oriented to person, place, and time. No cranial nerve deficit. She exhibits normal muscle tone. Coordination normal.  Skin: Skin is warm and dry. No rash noted. She is not diaphoretic.  Psychiatric: She has a normal mood and affect. Her behavior is normal. Judgment and thought content normal.      Assessment & Plan:

## 2014-06-07 ENCOUNTER — Encounter: Payer: Self-pay | Admitting: Internal Medicine

## 2014-06-09 ENCOUNTER — Encounter: Payer: Self-pay | Admitting: Nurse Practitioner

## 2014-06-09 DIAGNOSIS — N390 Urinary tract infection, site not specified: Secondary | ICD-10-CM | POA: Insufficient documentation

## 2014-06-09 DIAGNOSIS — IMO0001 Reserved for inherently not codable concepts without codable children: Secondary | ICD-10-CM | POA: Insufficient documentation

## 2014-06-09 LAB — URINE CULTURE: Colony Count: >=100000

## 2014-06-09 NOTE — Assessment & Plan Note (Signed)
POCT urine shows infection. Will culture and start Cipro 500 mg twice daily for 10. Stay hydrated and encouraged her to seek emergency care if vomiting and unable to keep down anything. FU in a week.

## 2014-06-09 NOTE — Assessment & Plan Note (Signed)
Body aches. Concerned for Flu (fever, nausea, aches) POCT flu was negative.

## 2014-06-11 ENCOUNTER — Ambulatory Visit: Payer: Self-pay | Admitting: General Surgery

## 2014-06-12 ENCOUNTER — Encounter: Payer: Self-pay | Admitting: Nurse Practitioner

## 2014-06-12 ENCOUNTER — Ambulatory Visit (INDEPENDENT_AMBULATORY_CARE_PROVIDER_SITE_OTHER): Payer: BLUE CROSS/BLUE SHIELD | Admitting: Nurse Practitioner

## 2014-06-12 VITALS — BP 142/70 | HR 69 | Temp 98.3°F | Resp 14 | Ht 63.5 in

## 2014-06-12 DIAGNOSIS — N390 Urinary tract infection, site not specified: Secondary | ICD-10-CM

## 2014-06-12 LAB — POCT URINALYSIS DIPSTICK
Bilirubin, UA: NEGATIVE
Blood, UA: NEGATIVE
Glucose, UA: NEGATIVE
Ketones, UA: NEGATIVE
Leukocytes, UA: NEGATIVE
Nitrite, UA: NEGATIVE
Protein, UA: NEGATIVE
Spec Grav, UA: 1.02
Urobilinogen, UA: 2
pH, UA: 5

## 2014-06-12 NOTE — Assessment & Plan Note (Signed)
Repeat of UTI was normal. Pt improving. She was given a letter to excuse from work from last Wednesday through tomorrow (return 2/11). Continue with fluids and antibiotics. FU if worsening.

## 2014-06-12 NOTE — Progress Notes (Signed)
Pre visit review using our clinic review tool, if applicable. No additional management support is needed unless otherwise documented below in the visit note. 

## 2014-06-12 NOTE — Progress Notes (Signed)
   Subjective:    Patient ID: Kimberly Huff, female    DOB: [birth year], 69 y.o.   MRN: [id number]  HPI  Kimberly Huff is a 69 yo female with a CC of cough and medication management.   1) Cough w/ fever, hurts all over from coughing. Requests urine to be tested again. A few more days of Cipro. Eating chicken soup Thursday on. Sweating, has not left house since last week. Washing hands and drinking lots of water.   Coughing clear mucous, using neti pot  Tessalon perles not helpful  No other cough syrups or medicine  Allegra- non-drowsy helpful   Review of Systems  Constitutional: Positive for diaphoresis and fatigue. Negative for fever and chills.  HENT: Positive for congestion.   Respiratory: Positive for cough. Negative for chest tightness, shortness of breath and wheezing.   Cardiovascular: Negative for chest pain, palpitations and leg swelling.  Gastrointestinal: Negative for nausea, vomiting and diarrhea.  Genitourinary: Positive for flank pain. Negative for dysuria, hematuria and difficulty urinating.       Flank pain is lesser, but still there  Skin: Negative for rash.     Objective:   Physical Exam  Constitutional: She is oriented to person, place, and time. She appears well-developed and well-nourished. No distress.  BP 142/70 mmHg  Pulse 69  Temp(Src) 98.3 F (36.8 C) (Oral)  Resp 14  Ht 5' 3.5" (1.613 m)  Wt   SpO2 97%   HENT:  Head: Normocephalic and atraumatic.  Right Ear: External ear normal.  Left Ear: External ear normal.  Cardiovascular: Normal rate, regular rhythm, normal heart sounds and intact distal pulses.  Exam reveals no gallop and no friction rub.   No murmur heard. Pulmonary/Chest: Effort normal and breath sounds normal. No respiratory distress. She has no wheezes. She has no rales. She exhibits no tenderness.  Neurological: She is alert and oriented to person, place, and time. No cranial nerve deficit. She exhibits normal muscle tone. Coordination  normal.  Skin: Skin is warm and dry. No rash noted. She is not diaphoretic.  Psychiatric: She has a normal mood and affect. Her behavior is normal. Judgment and thought content normal.      Assessment & Plan:

## 2014-06-12 NOTE — Patient Instructions (Signed)
Make sure you cancel the appointment on Thursday.  Let us know if symptoms return.

## 2014-06-14 ENCOUNTER — Ambulatory Visit: Payer: BLUE CROSS/BLUE SHIELD | Admitting: Internal Medicine

## 2014-07-03 ENCOUNTER — Encounter: Payer: Self-pay | Admitting: *Deleted

## 2014-07-09 ENCOUNTER — Ambulatory Visit (INDEPENDENT_AMBULATORY_CARE_PROVIDER_SITE_OTHER): Payer: BLUE CROSS/BLUE SHIELD | Admitting: General Surgery

## 2014-07-09 ENCOUNTER — Encounter: Payer: Self-pay | Admitting: General Surgery

## 2014-07-09 VITALS — BP 120/74 | HR 80 | Resp 12 | Ht 63.0 in

## 2014-07-09 DIAGNOSIS — N6019 Diffuse cystic mastopathy of unspecified breast: Secondary | ICD-10-CM

## 2014-07-09 DIAGNOSIS — Z1239 Encounter for other screening for malignant neoplasm of breast: Secondary | ICD-10-CM | POA: Diagnosis not present

## 2014-07-09 NOTE — Progress Notes (Signed)
Patient ID: Kimberly Huff, female   DOB: 15-Feb-1946, 69 y.o.   MRN: [id number]  Chief Complaint  Patient presents with  . Follow-up    mammogram    HPI Kimberly Huff is a 69 y.o. female who presents for a breast evaluation. The most recent mammogram was done on 05/21/14. Patient does perform regular self breast checks and gets regular mammograms done. The patient denies any new problems with the breasts at this time.     HPI  Past Medical History  Diagnosis Date  . Kidney stone   . Diverticulosis 2005    Dr Jamal Collin  . Coronary artery disease   . Hypertension   . Hyperlipidemia   . Type II or unspecified type diabetes mellitus without mention of complication, uncontrolled   . Hypopotassemia   . Atrophic kidney     right  . Osteoarthritis   . Coronary atherosclerosis   . Obesity   . Myocardial infarction 2000  . Recurrent UTI   . Steatohepatitis 05/30/10    Brunt Stage 3  . Diabetes mellitus   . Allergy   . Blood transfusion   . Cataract   . Neuromuscular disorder     "achy muscles"  . External hemorrhoids without mention of complication 8088  . Unspecified essential hypertension 2014  . Heart murmur 2012    found by Dr. Jamal Collin  . H/O cardiac catheterization 2005  . Spinal stenosis of cervical region   . Blood transfusion without reported diagnosis   . Clotting disorder     blood clots in abdomen after surgery  . GERD (gastroesophageal reflux disease)   . Ulcer     Past Surgical History  Procedure Laterality Date  . Breast mass excision      benign  . Ureterotomy  1960/1969    x 2 during childhood  . Abdominal hysterectomy  1982    complete  . Ptca  2000  . Vaginal hysterectomy      PT DENIES ABD HYSTERECTOMY  . Foot surgery  2008  . Cardiac stents  2000  . Tonsillectomy    . Salpingoophorectomy    . Colonoscopy  2004, 2015    Dr. Jamal Collin, Dr. Olevia Perches  . Eye surgery Bilateral 2012    cataract  . Upper gi endoscopy    . Cataract extraction      both eyes     Family History  Problem Relation Age of Onset  . Autoimmune disease Daughter   . Thyroid disease Daughter   . Thyroid disease Sister   . Lung cancer Father   . Heart disease Father   . Diverticulosis Mother   . Diabetes Mother   . Stroke Mother 38  . Stomach cancer Maternal Aunt   . Cancer Maternal Aunt     breast cancer  . Colon cancer Neg Hx   . Esophageal cancer Neg Hx   . Rectal cancer Neg Hx     Social History History  Substance Use Topics  . Smoking status: Former Smoker    Types: Cigarettes    Quit date: 09/15/1998  . Smokeless tobacco: Never Used  . Alcohol Use: Yes     Comment: very rare    No Known Allergies  Current Outpatient Prescriptions  Medication Sig Dispense Refill  . aspirin 325 MG tablet Take by mouth.    . furosemide (LASIX) 40 MG tablet TAKE 1 TABLET BY MOUTH EVERY DAY 30 tablet 2  . ibuprofen (ADVIL,MOTRIN) 800 MG tablet TAKE 1  TABLET BY MOUTH 3 TIMES A DAY AS NEEDED 60 tablet 1  . KLOR-CON 10 10 MEQ tablet TAKE 1 TABLET BY MOUTH EVERY DAY 30 tablet 3  . losartan (COZAAR) 50 MG tablet TAKE 1 TABLET EVERY DAY 90 tablet 1  . metFORMIN (GLUCOPHAGE-XR) 750 MG 24 hr tablet Take 1 tablet (750 mg total) by mouth daily with breakfast. 90 tablet 1  . omeprazole (PRILOSEC) 40 MG capsule Take 1 capsule (40 mg total) by mouth daily. 30 capsule 11  . simvastatin (ZOCOR) 40 MG tablet TAKE 1 TABLET BY MOUTH AT BEDTIME 30 tablet 0   No current facility-administered medications for this visit.    Review of Systems Review of Systems  Constitutional: Negative.   Respiratory: Negative.   Cardiovascular: Negative.     Blood pressure 120/74, pulse 80, resp. rate 12, height 5' 3"  (1.6 m), weight 0 lb (0 kg).  Physical Exam Physical Exam  Constitutional: She is oriented to person, place, and time. She appears well-developed and well-nourished.  Eyes: Conjunctivae are normal. No scleral icterus.  Neck: Neck supple. No thyromegaly present.   Cardiovascular: Normal rate and regular rhythm.   Murmur heard.  Systolic murmur is present with a grade of 3/6  Pulmonary/Chest: Effort normal and breath sounds normal. Right breast exhibits no inverted nipple, no mass, no nipple discharge, no skin change and no tenderness. Left breast exhibits no inverted nipple, no mass, no nipple discharge, no skin change and no tenderness.  Abdominal: Soft. Bowel sounds are normal. There is no tenderness.  Lymphadenopathy:    She has no cervical adenopathy.    She has no axillary adenopathy.  Neurological: She is alert and oriented to person, place, and time.  Skin: Skin is warm and dry.    Data Reviewed Mammogram reviewed and stable.  Assessment    History of FCD- stable exam.     Plan    Patient to return in 1 year with bilateral screening mammogram.        Junie Panning G 07/09/2014, 2:22 PM

## 2014-07-09 NOTE — Patient Instructions (Signed)
Patient to return in 1 year with bilateral screening mammogram. Continue self breast exams. Call office for any new breast issues or concerns.

## 2014-07-23 ENCOUNTER — Other Ambulatory Visit: Payer: Self-pay | Admitting: *Deleted

## 2014-07-23 MED ORDER — POTASSIUM CHLORIDE ER 10 MEQ PO TBCR: 10.0000 meq | EXTENDED_RELEASE_TABLET | Freq: Every day | ORAL | Status: DC

## 2014-08-12 ENCOUNTER — Other Ambulatory Visit: Payer: Self-pay | Admitting: Internal Medicine

## 2014-08-13 NOTE — Telephone Encounter (Signed)
Last refill 2.20.16, last OV 2.8.16.  Please advise refill

## 2014-08-26 ENCOUNTER — Other Ambulatory Visit: Payer: Self-pay | Admitting: Internal Medicine

## 2014-09-06 ENCOUNTER — Encounter: Payer: Self-pay | Admitting: Internal Medicine

## 2014-09-06 DIAGNOSIS — E669 Obesity, unspecified: Secondary | ICD-10-CM

## 2014-09-06 DIAGNOSIS — E559 Vitamin D deficiency, unspecified: Secondary | ICD-10-CM

## 2014-09-06 DIAGNOSIS — E1169 Type 2 diabetes mellitus with other specified complication: Secondary | ICD-10-CM

## 2014-09-06 DIAGNOSIS — I10 Essential (primary) hypertension: Secondary | ICD-10-CM

## 2014-09-06 DIAGNOSIS — K7460 Unspecified cirrhosis of liver: Secondary | ICD-10-CM

## 2014-09-06 DIAGNOSIS — E1159 Type 2 diabetes mellitus with other circulatory complications: Secondary | ICD-10-CM

## 2014-09-06 DIAGNOSIS — I152 Hypertension secondary to endocrine disorders: Secondary | ICD-10-CM

## 2014-09-10 ENCOUNTER — Encounter: Payer: Self-pay | Admitting: Internal Medicine

## 2014-09-10 ENCOUNTER — Ambulatory Visit (INDEPENDENT_AMBULATORY_CARE_PROVIDER_SITE_OTHER): Payer: BLUE CROSS/BLUE SHIELD | Admitting: Internal Medicine

## 2014-09-10 VITALS — BP 124/74 | HR 79 | Temp 99.0°F | Resp 14 | Ht 63.5 in

## 2014-09-10 DIAGNOSIS — E1329 Other specified diabetes mellitus with other diabetic kidney complication: Secondary | ICD-10-CM

## 2014-09-10 DIAGNOSIS — E1365 Other specified diabetes mellitus with hyperglycemia: Secondary | ICD-10-CM

## 2014-09-10 DIAGNOSIS — IMO0002 Reserved for concepts with insufficient information to code with codable children: Secondary | ICD-10-CM

## 2014-09-10 DIAGNOSIS — E1159 Type 2 diabetes mellitus with other circulatory complications: Secondary | ICD-10-CM

## 2014-09-10 DIAGNOSIS — E669 Obesity, unspecified: Secondary | ICD-10-CM

## 2014-09-10 DIAGNOSIS — E1169 Type 2 diabetes mellitus with other specified complication: Secondary | ICD-10-CM

## 2014-09-10 DIAGNOSIS — E559 Vitamin D deficiency, unspecified: Secondary | ICD-10-CM | POA: Diagnosis not present

## 2014-09-10 DIAGNOSIS — I10 Essential (primary) hypertension: Secondary | ICD-10-CM | POA: Diagnosis not present

## 2014-09-10 DIAGNOSIS — K7460 Unspecified cirrhosis of liver: Secondary | ICD-10-CM | POA: Diagnosis not present

## 2014-09-10 DIAGNOSIS — N058 Unspecified nephritic syndrome with other morphologic changes: Secondary | ICD-10-CM

## 2014-09-10 DIAGNOSIS — E119 Type 2 diabetes mellitus without complications: Secondary | ICD-10-CM

## 2014-09-10 DIAGNOSIS — K1121 Acute sialoadenitis: Secondary | ICD-10-CM | POA: Diagnosis not present

## 2014-09-10 DIAGNOSIS — I152 Hypertension secondary to endocrine disorders: Secondary | ICD-10-CM

## 2014-09-10 MED ORDER — AMOXICILLIN-POT CLAVULANATE 875-125 MG PO TABS: 1.0000 | ORAL_TABLET | Freq: Two times a day (BID) | ORAL | Status: DC

## 2014-09-10 NOTE — Patient Instructions (Addendum)
I think you have a blocked parotid gland,  But I am treating you with augmentin in case I am wrong  Please take a probiotic ( Align, Floraque or Culturelle) while you are on the antibiotic for a total of 3 weeks to prevent a serious antibiotic associated diarrhea  Called clostirudium dificile colitis and a vaginal yeast infection

## 2014-09-10 NOTE — Progress Notes (Signed)
Pre-visit discussion using our clinic review tool. No additional management support is needed unless otherwise documented below in the visit note.  

## 2014-09-10 NOTE — Progress Notes (Signed)
Patient ID: Kimberly Huff, female   DOB: 26-Jan-1946, 69 y.o.   MRN: [id number]    Patient Active Problem List   Diagnosis Date Noted  . Acute parotitis 09/10/2014  . Urinary tract infectious disease 06/09/2014  . Myalgia and myositis 06/09/2014  . Personal history of gastric ulcer 02/23/2013  . Left shoulder pain 02/23/2013  . Routine general medical examination at a health care facility 02/23/2013  . Vitamin D deficiency 02/23/2013  . Humeral shaft fracture 02/16/2012  . Gouty arthritis 08/25/2011  . DM (diabetes mellitus), secondary, uncontrolled, with renal complications 84/69/6295  . Hemorrhoid 05/27/2011  . Obesity (BMI 30-39.9) 02/16/2011  . Dyslipidemia associated with type 2 diabetes mellitus 02/16/2011  . Cataract extraction status 02/16/2011  . Hydronephrosis, left 02/16/2011  . NASH (nonalcoholic steatohepatitis) 02/16/2011    Subjective:  CC:   Chief Complaint  Patient presents with  . Otalgia    Neck pain and jaw pain.x 4 days. [facility] was advised to go ER patient waited and come herer Right side swollen at ear.    HPI:   Kimberly Huff is a 69 y.o. female who presents for  Swollen right side of neck  At the jaw line.  Symptoms started  last Thursday  some ear pain without any recent history of sinusitis,  Last dentist visit was less than two weeks ago, but no work was done on the lower mandible except a teeth cleaning.   Past Medical History  Diagnosis Date  . Kidney stone   . Diverticulosis 2005    Dr Jamal Collin  . Coronary artery disease   . Hypertension   . Hyperlipidemia   . Type II or unspecified type diabetes mellitus without mention of complication, uncontrolled   . Hypopotassemia   . Atrophic kidney     right  . Osteoarthritis   . Coronary atherosclerosis   . Obesity   . Myocardial infarction 2000  . Recurrent UTI   . Steatohepatitis 05/30/10    Brunt Stage 3  . Diabetes mellitus   . Allergy   . Blood transfusion   .  Cataract   . Neuromuscular disorder     "achy muscles"  . External hemorrhoids without mention of complication 2841  . Unspecified essential hypertension 2014  . Heart murmur 2012    found by Dr. Jamal Collin  . H/O cardiac catheterization 2005  . Spinal stenosis of cervical region   . Blood transfusion without reported diagnosis   . Clotting disorder     blood clots in abdomen after surgery  . GERD (gastroesophageal reflux disease)   . Ulcer     Past Surgical History  Procedure Laterality Date  . Breast mass excision      benign  . Ureterotomy  1960/1969    x 2 during childhood  . Abdominal hysterectomy  1982    complete  . Ptca  2000  . Vaginal hysterectomy      PT DENIES ABD HYSTERECTOMY  . Foot surgery  2008  . Cardiac stents  2000  . Tonsillectomy    . Salpingoophorectomy    . Colonoscopy  2004, 2015    Dr. Jamal Collin, Dr. Olevia Perches  . Eye surgery Bilateral 2012    cataract  . Upper gi endoscopy    . Cataract extraction      both eyes       The following portions of the patient's history were reviewed and updated as appropriate: Allergies, current medications, and problem list.  Review of Systems:   Patient denies headache, fevers, malaise, unintentional weight loss, skin rash, eye pain, sinus congestion and sinus pain, sore throat, dysphagia,  hemoptysis , cough, dyspnea, wheezing, chest pain, palpitations, orthopnea, edema, abdominal pain, nausea, melena, diarrhea, constipation, flank pain, dysuria, hematuria, urinary  Frequency, nocturia, numbness, tingling, seizures,  Focal weakness, Loss of consciousness,  Tremor, insomnia, depression, anxiety, and suicidal ideation.     History   Social History  . Marital Status: Widowed    Spouse Name: N/A  . Number of Children: 2  . Years of Education: N/A   Occupational History  . Account executive    Social History Main Topics  . Smoking status: Former Smoker    Types: Cigarettes    Quit date: 09/15/1998  .  Smokeless tobacco: Never Used  . Alcohol Use: Yes     Comment: very rare  . Drug Use: No  . Sexual Activity: Not on file   Other Topics Concern  . Not on file   Social History Narrative    Objective:  Filed Vitals:   09/10/14 1643  BP: 124/74  Pulse: 79  Temp: 99 F (37.2 C)  Resp: 14     General appearance: alert, cooperative and appears stated age Ears: normal TM's and external ear canals both ears Throat: lips, mucosa, and tongue normal; teeth and gums normal Neck: right sided tender mass over inferior border of parotid gland , trachea midline and thyroid not enlarged, symmetric Back: symmetric, no curvature. ROM normal. No CVA tenderness. Lungs: clear to auscultation bilaterally Heart: regular rate and rhythm, S1, S2 normal, no murmur, click, rub or gallop Abdomen: soft, non-tender; bowel sounds normal; no masses,  no organomegaly Pulses: 2+ and symmetric Skin: Skin color, texture, turgor normal. No rashes or lesions Lymph nodes: Cervical, supraclavicular, and axillary nodes normal.  Assessment and Plan:  Acute parotitis VERSUS sialolithiasis.  Treating empirically with augmentin ,  Handout given for supopritve care,  Follow up with ENT.  [facility],  Office called/    Vitamin D deficiency Recurrent,  with repeat level 26,    DM (diabetes mellitus), secondary, uncontrolled, with renal complications Uncontrolled .  Patient advised to return with log of blood sugars.  Lab Results  Component Value Date   HGBA1C 9.0* 09/10/2014   Lab Results  Component Value Date   MICROALBUR 8.8* 03/07/2014       Updated Medication List Outpatient Encounter Prescriptions as of 09/10/2014  Medication Sig  . aspirin 325 MG tablet Take by mouth.  . furosemide (LASIX) 40 MG tablet TAKE 1 TABLET BY MOUTH EVERY DAY  . ibuprofen (ADVIL,MOTRIN) 800 MG tablet TAKE 1 TABLET BY MOUTH 3 TIMES A DAY AS NEEDED  . losartan (COZAAR) 50 MG tablet TAKE 1 TABLET EVERY DAY  .  metFORMIN (GLUCOPHAGE-XR) 750 MG 24 hr tablet Take 1 tablet (750 mg total) by mouth daily with breakfast.  . omeprazole (PRILOSEC) 40 MG capsule Take 1 capsule (40 mg total) by mouth daily.  . potassium chloride (KLOR-CON 10) 10 MEQ tablet Take 1 tablet (10 mEq total) by mouth daily.  . simvastatin (ZOCOR) 40 MG tablet TAKE 1 TABLET BY MOUTH AT BEDTIME  . amoxicillin-clavulanate (AUGMENTIN) 875-125 MG per tablet Take 1 tablet by mouth 2 (two) times daily.  . ergocalciferol (DRISDOL) 50000 UNITS capsule Take 1 capsule (50,000 Units total) by mouth once a week.   No facility-administered encounter medications on file as of 09/10/2014.     Orders Placed This Encounter  Procedures  . LDL cholesterol, direct  . Ambulatory referral to ENT    No Follow-up on file.

## 2014-09-10 NOTE — Assessment & Plan Note (Signed)
VERSUS sialolithiasis.  Treating empirically with augmentin ,  Handout given for supopritve care,  Follow up with ENT.  Anda Lat[facility],  Office called/

## 2014-09-11 ENCOUNTER — Encounter: Payer: Self-pay | Admitting: Internal Medicine

## 2014-09-11 LAB — CBC WITH DIFFERENTIAL/PLATELET
Basophils Absolute: 0 10*3/uL (ref 0.0–0.1)
Basophils Relative: 0.3 % (ref 0.0–3.0)
Eosinophils Absolute: 0.2 10*3/uL (ref 0.0–0.7)
Eosinophils Relative: 2.7 % (ref 0.0–5.0)
HCT: 38.9 % (ref 36.0–46.0)
Hemoglobin: 13.3 g/dL (ref 12.0–15.0)
Lymphocytes Relative: 43.9 % (ref 12.0–46.0)
Lymphs Abs: 2.7 10*3/uL (ref 0.7–4.0)
MCHC: 34.2 g/dL (ref 30.0–36.0)
MCV: 82.1 fl (ref 78.0–100.0)
Monocytes Absolute: 0.3 10*3/uL (ref 0.1–1.0)
Monocytes Relative: 5.4 % (ref 3.0–12.0)
Neutro Abs: 3 10*3/uL (ref 1.4–7.7)
Neutrophils Relative %: 47.7 % (ref 43.0–77.0)
Platelets: 144 10*3/uL — ABNORMAL LOW (ref 150.0–400.0)
RBC: 4.74 Mil/uL (ref 3.87–5.11)
RDW: 13.7 % (ref 11.5–15.5)
WBC: 6.2 10*3/uL (ref 4.0–10.5)

## 2014-09-11 LAB — LIPID PANEL
Cholesterol: 140 mg/dL (ref 0–200)
HDL: 19.3 mg/dL — ABNORMAL LOW (ref >39.00)
Total CHOL/HDL Ratio: 7
Triglycerides: 424 mg/dL — ABNORMAL HIGH (ref 0.0–149.0)

## 2014-09-11 LAB — LDL CHOLESTEROL, DIRECT: Direct LDL: 80 mg/dL

## 2014-09-11 LAB — COMPREHENSIVE METABOLIC PANEL
ALT: 66 U/L — ABNORMAL HIGH (ref 0–35)
AST: 46 U/L — ABNORMAL HIGH (ref 0–37)
Albumin: 3.9 g/dL (ref 3.5–5.2)
Alkaline Phosphatase: 84 U/L (ref 39–117)
BUN: 17 mg/dL (ref 6–23)
CO2: 25 mEq/L (ref 19–32)
Calcium: 9.9 mg/dL (ref 8.4–10.5)
Chloride: 102 mEq/L (ref 96–112)
Creatinine, Ser: 0.71 mg/dL (ref 0.40–1.20)
GFR: 86.24 mL/min (ref >60.00)
Glucose, Bld: 351 mg/dL — ABNORMAL HIGH (ref 70–99)
Potassium: 4 mEq/L (ref 3.5–5.1)
Sodium: 136 mEq/L (ref 135–145)
Total Bilirubin: 0.4 mg/dL (ref 0.2–1.2)
Total Protein: 6.7 g/dL (ref 6.0–8.3)

## 2014-09-11 LAB — HEMOGLOBIN A1C: Hgb A1c MFr Bld: 9 % — ABNORMAL HIGH (ref 4.6–6.5)

## 2014-09-11 LAB — VITAMIN D 25 HYDROXY (VIT D DEFICIENCY, FRACTURES): VITD: 11.21 ng/mL — ABNORMAL LOW (ref 30.00–100.00)

## 2014-09-11 LAB — TSH: TSH: 0.76 u[IU]/mL (ref 0.35–4.50)

## 2014-09-11 LAB — PROTIME-INR
INR: 1 ratio (ref 0.8–1.0)
Prothrombin Time: 11.3 s (ref 9.6–13.1)

## 2014-09-11 MED ORDER — ERGOCALCIFEROL 1.25 MG (50000 UT) PO CAPS: 50000.0000 [IU] | ORAL_CAPSULE | ORAL | Status: DC

## 2014-09-11 NOTE — Assessment & Plan Note (Signed)
Recurrent,  with repeat level 11,

## 2014-09-11 NOTE — Assessment & Plan Note (Signed)
Uncontrolled .  Patient advised to return with log of blood sugars.  Lab Results  Component Value Date   HGBA1C 9.0* 09/10/2014   Lab Results  Component Value Date   MICROALBUR 8.8* 03/07/2014

## 2014-09-14 ENCOUNTER — Encounter: Payer: Self-pay | Admitting: Internal Medicine

## 2014-09-17 ENCOUNTER — Other Ambulatory Visit: Payer: Self-pay | Admitting: Unknown Physician Specialty

## 2014-09-17 DIAGNOSIS — R221 Localized swelling, mass and lump, neck: Secondary | ICD-10-CM

## 2014-09-20 ENCOUNTER — Ambulatory Visit
Admission: RE | Admit: 2014-09-20 | Discharge: 2014-09-20 | Disposition: A | Discharge status: Home / Self Care | Payer: BLUE CROSS/BLUE SHIELD | Source: Ambulatory Visit | Attending: Unknown Physician Specialty | Admitting: Unknown Physician Specialty

## 2014-09-20 ENCOUNTER — Ambulatory Visit: Admission: RE | Admit: 2014-09-20 | Payer: Medicare Other | Source: Ambulatory Visit

## 2014-09-20 DIAGNOSIS — M542 Cervicalgia: Secondary | ICD-10-CM | POA: Diagnosis present

## 2014-09-20 DIAGNOSIS — R221 Localized swelling, mass and lump, neck: Secondary | ICD-10-CM | POA: Diagnosis not present

## 2014-09-20 DIAGNOSIS — M8580 Other specified disorders of bone density and structure, unspecified site: Secondary | ICD-10-CM | POA: Diagnosis not present

## 2014-09-20 MED ORDER — IOHEXOL 300 MG/ML  SOLN
75.0000 mL | Freq: Once | INTRAMUSCULAR | Status: AC | PRN
  Administered 2014-09-20: 75 mL via INTRAVENOUS

## 2014-09-21 ENCOUNTER — Telehealth: Payer: Self-pay | Admitting: Internal Medicine

## 2014-09-21 ENCOUNTER — Inpatient Hospital Stay: Admission: RE | Admit: 2014-09-21 | Payer: Medicare Other | Source: Ambulatory Visit

## 2014-09-21 ENCOUNTER — Other Ambulatory Visit: Payer: Self-pay | Admitting: Internal Medicine

## 2014-09-21 DIAGNOSIS — K7581 Nonalcoholic steatohepatitis (NASH): Secondary | ICD-10-CM

## 2014-09-21 NOTE — Telephone Encounter (Signed)
Radiology called back no need to change order.

## 2014-09-21 NOTE — Telephone Encounter (Signed)
The patient requesting an ABD complete for Cirrhosis of the liver.

## 2014-09-22 ENCOUNTER — Other Ambulatory Visit: Payer: Self-pay | Admitting: Internal Medicine

## 2014-09-27 ENCOUNTER — Ambulatory Visit (HOSPITAL_COMMUNITY): Payer: BLUE CROSS/BLUE SHIELD

## 2014-09-27 ENCOUNTER — Encounter: Payer: Self-pay | Admitting: Internal Medicine

## 2014-09-27 ENCOUNTER — Other Ambulatory Visit: Payer: Medicare Other

## 2014-09-27 ENCOUNTER — Ambulatory Visit (HOSPITAL_COMMUNITY)
Admission: RE | Admit: 2014-09-27 | Discharge: 2014-09-27 | Disposition: A | Discharge status: Home / Self Care | Payer: BLUE CROSS/BLUE SHIELD | Source: Ambulatory Visit | Attending: Internal Medicine | Admitting: Internal Medicine

## 2014-09-27 ENCOUNTER — Other Ambulatory Visit: Payer: Self-pay | Admitting: Internal Medicine

## 2014-09-27 ENCOUNTER — Ambulatory Visit: Payer: Medicare Other

## 2014-09-27 DIAGNOSIS — K7460 Unspecified cirrhosis of liver: Secondary | ICD-10-CM | POA: Diagnosis present

## 2014-09-27 DIAGNOSIS — K760 Fatty (change of) liver, not elsewhere classified: Secondary | ICD-10-CM | POA: Diagnosis not present

## 2014-09-27 MED ORDER — METFORMIN HCL 850 MG PO TABS: 850.0000 mg | ORAL_TABLET | Freq: Two times a day (BID) | ORAL | Status: DC

## 2014-10-03 ENCOUNTER — Encounter: Payer: Self-pay | Admitting: Internal Medicine

## 2014-10-04 NOTE — Telephone Encounter (Signed)
Pt was given results in other mychart encounter, see other encounter.

## 2014-10-12 ENCOUNTER — Encounter: Payer: Self-pay | Admitting: Internal Medicine

## 2014-10-15 ENCOUNTER — Other Ambulatory Visit: Payer: Self-pay | Admitting: Internal Medicine

## 2014-10-15 NOTE — Telephone Encounter (Signed)
Last OV 5.9.16, please advise refill

## 2014-10-16 NOTE — Telephone Encounter (Signed)
Ok to refill,  Refill sent  

## 2014-11-12 ENCOUNTER — Other Ambulatory Visit: Payer: Self-pay

## 2014-11-12 MED ORDER — POTASSIUM CHLORIDE ER 10 MEQ PO TBCR
10.0000 meq | EXTENDED_RELEASE_TABLET | Freq: Every day | ORAL | Status: DC
Start: 2014-11-12 — End: 2015-03-07

## 2014-12-02 ENCOUNTER — Other Ambulatory Visit: Payer: Self-pay | Admitting: Internal Medicine

## 2014-12-08 ENCOUNTER — Other Ambulatory Visit: Payer: Self-pay | Admitting: Internal Medicine

## 2014-12-26 ENCOUNTER — Encounter: Payer: Self-pay | Admitting: Internal Medicine

## 2014-12-28 ENCOUNTER — Other Ambulatory Visit: Payer: Self-pay | Admitting: Internal Medicine

## 2014-12-28 DIAGNOSIS — M109 Gout, unspecified: Secondary | ICD-10-CM

## 2014-12-28 DIAGNOSIS — IMO0002 Reserved for concepts with insufficient information to code with codable children: Secondary | ICD-10-CM

## 2014-12-28 DIAGNOSIS — E1329 Other specified diabetes mellitus with other diabetic kidney complication: Secondary | ICD-10-CM

## 2014-12-28 DIAGNOSIS — E1169 Type 2 diabetes mellitus with other specified complication: Secondary | ICD-10-CM

## 2014-12-28 DIAGNOSIS — E785 Hyperlipidemia, unspecified: Secondary | ICD-10-CM

## 2014-12-28 DIAGNOSIS — Z113 Encounter for screening for infections with a predominantly sexual mode of transmission: Secondary | ICD-10-CM

## 2014-12-28 DIAGNOSIS — E1365 Other specified diabetes mellitus with hyperglycemia: Secondary | ICD-10-CM

## 2014-12-28 DIAGNOSIS — K7581 Nonalcoholic steatohepatitis (NASH): Secondary | ICD-10-CM

## 2014-12-28 DIAGNOSIS — E559 Vitamin D deficiency, unspecified: Secondary | ICD-10-CM

## 2014-12-28 NOTE — Telephone Encounter (Signed)
FYI

## 2015-01-24 ENCOUNTER — Other Ambulatory Visit (INDEPENDENT_AMBULATORY_CARE_PROVIDER_SITE_OTHER): Payer: BLUE CROSS/BLUE SHIELD

## 2015-01-24 DIAGNOSIS — E1329 Other specified diabetes mellitus with other diabetic kidney complication: Secondary | ICD-10-CM | POA: Diagnosis not present

## 2015-01-24 DIAGNOSIS — M1009 Idiopathic gout, multiple sites: Secondary | ICD-10-CM

## 2015-01-24 DIAGNOSIS — E1169 Type 2 diabetes mellitus with other specified complication: Secondary | ICD-10-CM

## 2015-01-24 DIAGNOSIS — M109 Gout, unspecified: Secondary | ICD-10-CM

## 2015-01-24 DIAGNOSIS — Z113 Encounter for screening for infections with a predominantly sexual mode of transmission: Secondary | ICD-10-CM

## 2015-01-24 DIAGNOSIS — K7581 Nonalcoholic steatohepatitis (NASH): Secondary | ICD-10-CM | POA: Diagnosis not present

## 2015-01-24 DIAGNOSIS — N058 Unspecified nephritic syndrome with other morphologic changes: Secondary | ICD-10-CM

## 2015-01-24 DIAGNOSIS — E785 Hyperlipidemia, unspecified: Secondary | ICD-10-CM | POA: Diagnosis not present

## 2015-01-24 DIAGNOSIS — E559 Vitamin D deficiency, unspecified: Secondary | ICD-10-CM | POA: Diagnosis not present

## 2015-01-24 DIAGNOSIS — IMO0002 Reserved for concepts with insufficient information to code with codable children: Secondary | ICD-10-CM

## 2015-01-24 DIAGNOSIS — E1365 Other specified diabetes mellitus with hyperglycemia: Secondary | ICD-10-CM

## 2015-01-24 LAB — COMPREHENSIVE METABOLIC PANEL
ALT: 32 U/L (ref 0–35)
AST: 24 U/L (ref 0–37)
Albumin: 4.3 g/dL (ref 3.5–5.2)
Alkaline Phosphatase: 48 U/L (ref 39–117)
BUN: 19 mg/dL (ref 6–23)
CO2: 29 mEq/L (ref 19–32)
Calcium: 9.9 mg/dL (ref 8.4–10.5)
Chloride: 105 mEq/L (ref 96–112)
Creatinine, Ser: 0.64 mg/dL (ref 0.40–1.20)
GFR: 97.11 mL/min (ref >60.00)
Glucose, Bld: 117 mg/dL — ABNORMAL HIGH (ref 70–99)
Potassium: 4.9 mEq/L (ref 3.5–5.1)
Sodium: 141 mEq/L (ref 135–145)
Total Bilirubin: 0.5 mg/dL (ref 0.2–1.2)
Total Protein: 6.4 g/dL (ref 6.0–8.3)

## 2015-01-24 LAB — LIPID PANEL
Cholesterol: 100 mg/dL (ref 0–200)
HDL: 27 mg/dL — ABNORMAL LOW (ref >39.00)
LDL Cholesterol: 49 mg/dL (ref 0–99)
NonHDL: 73.41
Total CHOL/HDL Ratio: 4
Triglycerides: 124 mg/dL (ref 0.0–149.0)
VLDL: 24.8 mg/dL (ref 0.0–40.0)

## 2015-01-24 LAB — URIC ACID: Uric Acid, Serum: 8.8 mg/dL — ABNORMAL HIGH (ref 2.4–7.0)

## 2015-01-24 LAB — MICROALBUMIN / CREATININE URINE RATIO
Creatinine,U: 102.6 mg/dL
Microalb Creat Ratio: 3.3 mg/g (ref 0.0–30.0)
Microalb, Ur: 3.4 mg/dL — ABNORMAL HIGH (ref 0.0–1.9)

## 2015-01-24 LAB — HEMOGLOBIN A1C: Hgb A1c MFr Bld: 5.7 % (ref 4.6–6.5)

## 2015-01-24 LAB — VITAMIN D 25 HYDROXY (VIT D DEFICIENCY, FRACTURES): VITD: 46.54 ng/mL (ref 30.00–100.00)

## 2015-01-24 LAB — LDL CHOLESTEROL, DIRECT: Direct LDL: 63 mg/dL

## 2015-01-25 ENCOUNTER — Ambulatory Visit (INDEPENDENT_AMBULATORY_CARE_PROVIDER_SITE_OTHER): Payer: BLUE CROSS/BLUE SHIELD | Admitting: Internal Medicine

## 2015-01-25 ENCOUNTER — Encounter: Payer: Self-pay | Admitting: Internal Medicine

## 2015-01-25 VITALS — BP 118/70 | HR 63 | Temp 98.3°F | Resp 12 | Ht 63.0 in | Wt 189.1 lb

## 2015-01-25 DIAGNOSIS — N058 Unspecified nephritic syndrome with other morphologic changes: Secondary | ICD-10-CM | POA: Diagnosis not present

## 2015-01-25 DIAGNOSIS — E1129 Type 2 diabetes mellitus with other diabetic kidney complication: Secondary | ICD-10-CM | POA: Diagnosis not present

## 2015-01-25 DIAGNOSIS — K7581 Nonalcoholic steatohepatitis (NASH): Secondary | ICD-10-CM | POA: Diagnosis not present

## 2015-01-25 DIAGNOSIS — E1169 Type 2 diabetes mellitus with other specified complication: Secondary | ICD-10-CM

## 2015-01-25 DIAGNOSIS — Z0000 Encounter for general adult medical examination without abnormal findings: Secondary | ICD-10-CM

## 2015-01-25 DIAGNOSIS — E785 Hyperlipidemia, unspecified: Secondary | ICD-10-CM

## 2015-01-25 LAB — HEPATITIS C ANTIBODY: HCV Ab: NEGATIVE

## 2015-01-25 LAB — HIV ANTIBODY (ROUTINE TESTING W REFLEX): HIV 1&2 Ab, 4th Generation: NONREACTIVE

## 2015-01-25 NOTE — Patient Instructions (Addendum)
Your diabetes remains under excellent control  And your cholesterol and other labs are also normal.!  Please continue your current medications. return in 6 months for follow up on diabetes and make sure you are seeing your eye doctor at least once a year.    Try the Heritage Flakes cereal available at [facility] on the bottom shelf,  And try substituting regular or  vanilla flavored almond milk  [employer] sells an organic almond mik, non refrigerated,  Called :"orgain"  That is fortified with protein and very low carb , NO CHOLESTEROL  Health Maintenance Adopting a healthy lifestyle and getting preventive care can go a long way to promote health and wellness. Talk with your health care provider about what schedule of regular examinations is right for you. This is a good chance for you to check in with your provider about disease prevention and staying healthy. In between checkups, there are plenty of things you can do on your own. Experts have done a lot of research about which lifestyle changes and preventive measures are most likely to keep you healthy. Ask your health care provider for more information. WEIGHT AND DIET  Eat a healthy diet  Be sure to include plenty of vegetables, fruits, low-fat dairy products, and lean protein.  Do not eat a lot of foods high in solid fats, added sugars, or salt.  Get regular exercise. This is one of the most important things you can do for your health.  Most adults should exercise for at least 150 minutes each week. The exercise should increase your heart rate and make you sweat (moderate-intensity exercise).  Most adults should also do strengthening exercises at least twice a week. This is in addition to the moderate-intensity exercise.  Maintain a healthy weight  Body mass index (BMI) is a measurement that can be used to identify possible weight problems. It estimates body fat based on height and weight. Your health care provider can help determine your  BMI and help you achieve or maintain a healthy weight.  For females 59 years of age and older:   A BMI below 18.5 is considered underweight.  A BMI of 18.5 to 24.9 is normal.  A BMI of 25 to 29.9 is considered overweight.  A BMI of 30 and above is considered obese.  Watch levels of cholesterol and blood lipids  You should start having your blood tested for lipids and cholesterol at 71 years of age, then have this test every 5 years.  You may need to have your cholesterol levels checked more often if:  Your lipid or cholesterol levels are high.  You are older than 71 years of age.  You are at high risk for heart disease.  CANCER SCREENING   Lung Cancer  Lung cancer screening is recommended for adults 78-21 years old who are at high risk for lung cancer because of a history of smoking.  A yearly low-dose CT scan of the lungs is recommended for people who:  Currently smoke.  Have quit within the past 15 years.  Have at least a 30-pack-year history of smoking. A pack year is smoking an average of one pack of cigarettes a day for 1 year.  Yearly screening should continue until it has been 15 years since you quit.  Yearly screening should stop if you develop a health problem that would prevent you from having lung cancer treatment.  Breast Cancer  Practice breast self-awareness. This means understanding how your breasts normally appear and feel.  feel.  It also means doing regular breast self-exams. Let your health care provider know about any changes, no matter how small.  If you are in your 20s or 30s, you should have a clinical breast exam (CBE) by a health care provider every 1-3 years as part of a regular health exam.  If you are 40 or older, have a CBE every year. Also consider having a breast X-ray (mammogram) every year.  If you have a family history of breast cancer, talk to your health care provider about genetic screening.  If you are at high risk for breast  cancer, talk to your health care provider about having an MRI and a mammogram every year.  Breast cancer gene (BRCA) assessment is recommended for women who have family members with BRCA-related cancers. BRCA-related cancers include:  Breast.  Ovarian.  Tubal.  Peritoneal cancers.  Results of the assessment will determine the need for genetic counseling and BRCA1 and BRCA2 testing. Cervical Cancer Routine pelvic examinations to screen for cervical cancer are no longer recommended for nonpregnant women who are considered low risk for cancer of the pelvic organs (ovaries, uterus, and vagina) and who do not have symptoms. A pelvic examination may be necessary if you have symptoms including those associated with pelvic infections. Ask your health care provider if a screening pelvic exam is right for you.   The Pap test is the screening test for cervical cancer for women who are considered at risk.  If you had a hysterectomy for a problem that was not cancer or a condition that could lead to cancer, then you no longer need Pap tests.  If you are older than 65 years, and you have had normal Pap tests for the past 10 years, you no longer need to have Pap tests.  If you have had past treatment for cervical cancer or a condition that could lead to cancer, you need Pap tests and screening for cancer for at least 20 years after your treatment.  If you no longer get a Pap test, assess your risk factors if they change (such as having a new sexual partner). This can affect whether you should start being screened again.  Some women have medical problems that increase their chance of getting cervical cancer. If this is the case for you, your health care provider may recommend more frequent screening and Pap tests.  The human papillomavirus (HPV) test is another test that may be used for cervical cancer screening. The HPV test looks for the virus that can cause cell changes in the cervix. The cells  collected during the Pap test can be tested for HPV.  The HPV test can be used to screen women 30 years of age and older. Getting tested for HPV can extend the interval between normal Pap tests from three to five years.  An HPV test also should be used to screen women of any age who have unclear Pap test results.  After 71 years of age, women should have HPV testing as often as Pap tests.  Colorectal Cancer  This type of cancer can be detected and often prevented.  Routine colorectal cancer screening usually begins at 71 years of age and continues through 71 years of age.  Your health care provider may recommend screening at an earlier age if you have risk factors for colon cancer.  Your health care provider may also recommend using home test kits to check for hidden blood in the stool.  A small camera   at the end of a tube can be used to examine your colon directly (sigmoidoscopy or colonoscopy). This is done to check for the earliest forms of colorectal cancer.  Routine screening usually begins at age 50.  Direct examination of the colon should be repeated every 5-10 years through 71 years of age. However, you may need to be screened more often if early forms of precancerous polyps or small growths are found. Skin Cancer  Check your skin from head to toe regularly.  Tell your health care provider about any new moles or changes in moles, especially if there is a change in a mole's shape or color.  Also tell your health care provider if you have a mole that is larger than the size of a pencil eraser.  Always use sunscreen. Apply sunscreen liberally and repeatedly throughout the day.  Protect yourself by wearing long sleeves, pants, a wide-brimmed hat, and sunglasses whenever you are outside. HEART DISEASE, DIABETES, AND HIGH BLOOD PRESSURE   Have your blood pressure checked at least every 1-2 years. High blood pressure causes heart disease and increases the risk of stroke.  If  you are between 55 years and 79 years old, ask your health care provider if you should take aspirin to prevent strokes.  Have regular diabetes screenings. This involves taking a blood sample to check your fasting blood sugar level.  If you are at a normal weight and have a low risk for diabetes, have this test once every three years after 71 years of age.  If you are overweight and have a high risk for diabetes, consider being tested at a younger age or more often. PREVENTING INFECTION  Hepatitis B  If you have a higher risk for hepatitis B, you should be screened for this virus. You are considered at high risk for hepatitis B if:  You were born in a country where hepatitis B is common. Ask your health care provider which countries are considered high risk.  Your parents were born in a high-risk country, and you have not been immunized against hepatitis B (hepatitis B vaccine).  You have HIV or AIDS.  You use needles to inject street drugs.  You live with someone who has hepatitis B.  You have had sex with someone who has hepatitis B.  You get hemodialysis treatment.  You take certain medicines for conditions, including cancer, organ transplantation, and autoimmune conditions. Hepatitis C  Blood testing is recommended for:  Everyone born from 1945 through 1965.  Anyone with known risk factors for hepatitis C. Sexually transmitted infections (STIs)  You should be screened for sexually transmitted infections (STIs) including gonorrhea and chlamydia if:  You are sexually active and are younger than 71 years of age.  You are older than 71 years of age and your health care provider tells you that you are at risk for this type of infection.  Your sexual activity has changed since you were last screened and you are at an increased risk for chlamydia or gonorrhea. Ask your health care provider if you are at risk.  If you do not have HIV, but are at risk, it may be recommended that  you take a prescription medicine daily to prevent HIV infection. This is called pre-exposure prophylaxis (PrEP). You are considered at risk if:  You are sexually active and do not regularly use condoms or know the HIV status of your partner(s).  You take drugs by injection.  You are sexually active with a partner   who has HIV. Talk with your health care provider about whether you are at high risk of being infected with HIV. If you choose to begin PrEP, you should first be tested for HIV. You should then be tested every 3 months for as long as you are taking PrEP.  PREGNANCY   If you are premenopausal and you may become pregnant, ask your health care provider about preconception counseling.  If you may become pregnant, take 400 to 800 micrograms (mcg) of folic acid every day.  If you want to prevent pregnancy, talk to your health care provider about birth control (contraception). OSTEOPOROSIS AND MENOPAUSE   Osteoporosis is a disease in which the bones lose minerals and strength with aging. This can result in serious bone fractures. Your risk for osteoporosis can be identified using a bone density scan.  If you are 65 years of age or older, or if you are at risk for osteoporosis and fractures, ask your health care provider if you should be screened.  Ask your health care provider whether you should take a calcium or vitamin D supplement to lower your risk for osteoporosis.  Menopause may have certain physical symptoms and risks.  Hormone replacement therapy may reduce some of these symptoms and risks. Talk to your health care provider about whether hormone replacement therapy is right for you.  HOME CARE INSTRUCTIONS   Schedule regular health, dental, and eye exams.  Stay current with your immunizations.   Do not use any tobacco products including cigarettes, chewing tobacco, or electronic cigarettes.  If you are pregnant, do not drink alcohol.  If you are breastfeeding, limit how  much and how often you drink alcohol.  Limit alcohol intake to no more than 1 drink per day for nonpregnant women. One drink equals 12 ounces of beer, 5 ounces of wine, or 1 ounces of hard liquor.  Do not use street drugs.  Do not share needles.  Ask your health care provider for help if you need support or information about quitting drugs.  Tell your health care provider if you often feel depressed.  Tell your health care provider if you have ever been abused or do not feel safe at home. Document Released: 11/03/2010 Document Revised: 09/04/2013 Document Reviewed: 03/22/2013 [employer] Patient Information 2015 [employer]. This information is not intended to replace advice given to you by your health care provider. Make sure you discuss any questions you have with your health care provider.  

## 2015-01-25 NOTE — Progress Notes (Signed)
Patient ID: Kimberly Huff, female    DOB: 01-13-1946  Age: 69 y.o. MRN: [id number]  The patient is here for annual Medicare wellness examination and management of other chronic and acute problems.   The risk factors are reflected in the social history.  The roster of all physicians providing medical care to patient - is listed in the Snapshot section of the chart.  Activities of daily living:  The patient is 100% independent in all ADLs: dressing, toileting, feeding as well as independent mobility  Home safety : The patient has smoke detectors in the home. They wear seatbelts.  There are no firearms at home. There is no violence in the home.   There is no risks for hepatitis, STDs or HIV. There is no   history of blood transfusion. They have no travel history to infectious disease endemic areas of the world.  The patient has seen their dentist in the last six month. They have seen their eye doctor in the last year. They admit to slight hearing difficulty with regard to whispered voices and some television programs.  They have deferred audiologic testing in the last year.  They do not  have excessive sun exposure. Discussed the need for sun protection: hats, long sleeves and use of sunscreen if there is significant sun exposure.   Diet: the importance of a healthy diet is discussed. They do have a healthy diet.  The benefits of regular aerobic exercise were discussed. She walks 4 times per week ,  20 minutes.   Depression screen: there are no signs or vegative symptoms of depression- irritability, change in appetite, anhedonia, sadness/tearfullness.  Cognitive assessment: the patient manages all their financial and personal affairs and is actively engaged. They could relate day,date,year and events; recalled 2/3 objects at 3 minutes; performed clock-face test normally.  The following portions of the patient's history were reviewed and updated as appropriate: allergies, current medications, past  family history, past medical history,  past surgical history, past social history  and problem list.  Visual acuity was not assessed per patient preference since she has regular follow up with her ophthalmologist. Hearing and body mass index were assessed and reviewed.   During the course of the visit the patient was educated and counseled about appropriate screening and preventive services including : fall prevention , diabetes screening, nutrition counseling, colorectal cancer screening, and recommended immunizations.    CC: The primary encounter diagnosis was Type 2 diabetes mellitus, controlled, with renal complications. Diagnoses of NASH (nonalcoholic steatohepatitis), Dyslipidemia associated with type 2 diabetes mellitus, and Medicare annual wellness visit, subsequent were also pertinent to this visit.   3 month follow up on Type 2 DM, recent diagnosis,  Hypertension, hyperlipidemia and obesity.  She has had an intentional  reduction of sugar in diet .  She has been keeping her carbohydrate content well under the recommended  45 carbs per meal by eliniiating sweetened tea and soda and cutting out bagels for breakfast.  She is eating oatmeal prepared from  steel cut oatsfor breakfast instead and  checks her sugars once daily in the morning, usually.   cbgs have been consistently under 130.   One episode of nausea last night, without vomiting .   Still working full time and caring for aging mother who has had a stroke and is no longer physically independent.     History Kimberly Huff has a past medical history of Kidney stone; Diverticulosis (2005); Coronary artery disease; Hypertension; Hyperlipidemia; Hypopotassemia; Atrophic kidney; Osteoarthritis;  Coronary atherosclerosis; Obesity; Myocardial infarction (2000); Recurrent UTI; Steatohepatitis (05/30/10); Allergy; Blood transfusion; Cataract; Neuromuscular disorder; External hemorrhoids without mention of complication (8588); Unspecified essential  hypertension (2014); Heart murmur (2012); H/O cardiac catheterization (2005); Spinal stenosis of cervical region; Blood transfusion without reported diagnosis; Clotting disorder; GERD (gastroesophageal reflux disease); Ulcer; Type II or unspecified type diabetes mellitus without mention of complication, uncontrolled; and Diabetes mellitus.   She has past surgical history that includes Breast mass excision; Ureterotomy (1960/1969); Abdominal hysterectomy (1982); Mitral valve replacement (2000); Vaginal hysterectomy; Foot surgery (2008); cardiac stents (2000); Tonsillectomy; Salpingoophorectomy; Colonoscopy (2004, 2015); Eye surgery (Bilateral, 2012); Upper gi endoscopy; and Cataract extraction.   Her family history includes Autoimmune disease in her daughter; Cancer in her maternal aunt; Diabetes in her mother; Diverticulosis in her mother; Heart disease in her father; Lung cancer in her father; Stomach cancer in her maternal aunt; Stroke (age of onset: 77) in her mother; Thyroid disease in her daughter and sister. There is no history of Colon cancer, Esophageal cancer, or Rectal cancer.She reports that she quit smoking about 16 years ago. Her smoking use included Cigarettes. She has never used smokeless tobacco. She reports that she drinks alcohol. She reports that she does not use illicit drugs.  Outpatient Prescriptions Prior to Visit  Medication Sig Dispense Refill  . aspirin 325 MG tablet Take by mouth.    . furosemide (LASIX) 40 MG tablet TAKE 1 TABLET BY MOUTH EVERY DAY 30 tablet 5  . ibuprofen (ADVIL,MOTRIN) 800 MG tablet TAKE 1 TABLET BY MOUTH 3 TIMES A DAY AS NEEDED 90 tablet 5  . losartan (COZAAR) 50 MG tablet TAKE 1 TABLET EVERY DAY 90 tablet 1  . metFORMIN (GLUCOPHAGE) 850 MG tablet Take 1 tablet (850 mg total) by mouth 2 (two) times daily with a meal. 180 tablet 3  . omeprazole (PRILOSEC) 40 MG capsule Take 1 capsule (40 mg total) by mouth daily. 30 capsule 11  . potassium chloride  (KLOR-CON 10) 10 MEQ tablet Take 1 tablet (10 mEq total) by mouth daily. 30 tablet 3  . simvastatin (ZOCOR) 40 MG tablet TAKE 1 TABLET BY MOUTH EVERY DAY AT 6PM 30 tablet 5  . Vitamin D, Ergocalciferol, (DRISDOL) 50000 UNITS CAPS capsule TAKE 1 CAPSULE (50,000 UNITS TOTAL) BY MOUTH ONCE A WEEK. 12 capsule 0  . amoxicillin-clavulanate (AUGMENTIN) 875-125 MG per tablet Take 1 tablet by mouth 2 (two) times daily. (Patient not taking: Reported on 01/25/2015) 14 tablet 0  . losartan (COZAAR) 50 MG tablet TAKE 1 TABLET EVERY DAY 90 tablet 1   No facility-administered medications prior to visit.    Review of Systems  Patient denies headache, fevers, malaise, unintentional weight loss, skin rash, eye pain, sinus congestion and sinus pain, sore throat, dysphagia,  hemoptysis , cough, dyspnea, wheezing, chest pain, palpitations, orthopnea, edema, abdominal pain, nausea, melena, diarrhea, constipation, flank pain, dysuria, hematuria, urinary  Frequency, nocturia, numbness, tingling, seizures,  Focal weakness, Loss of consciousness,  Tremor, insomnia, depression, anxiety, and suicidal ideation.     Objective:  BP 118/70 mmHg  Pulse 63  Temp(Src) 98.3 F (36.8 C) (Oral)  Resp 12  Ht 5' 3"  (1.6 m)  Wt 189 lb 2 oz (85.787 kg)  BMI 33.51 kg/m2  SpO2 98%  Physical Exam   General appearance: alert, cooperative and appears stated age Head: Normocephalic, without obvious abnormality, atraumatic Eyes: conjunctivae/corneas clear. PERRL, EOM's intact. Fundi benign. Ears: normal TM's and external ear canals both ears Nose: Nares normal. Septum midline. Mucosa  normal. No drainage or sinus tenderness. Throat: lips, mucosa, and tongue normal; teeth and gums normal Neck: no adenopathy, no carotid bruit, no JVD, supple, symmetrical, trachea midline and thyroid not enlarged, symmetric, no tenderness/mass/nodules Lungs: clear to auscultation bilaterally Breasts: normal appearance, no masses or tenderness Heart:  regular rate and rhythm, S1, S2 normal, no murmur, click, rub or gallop Abdomen: soft, non-tender; bowel sounds normal; no masses,  no organomegaly Extremities: extremities normal, atraumatic, no cyanosis or edema Pulses: 2+ and symmetric Skin: Skin color, texture, turgor normal. No rashes or lesions Neurologic: Alert and oriented X 3, normal strength and tone. Normal symmetric reflexes. Normal coordination and gait.  Foot exam:  Nails are well trimmed,  No callouses,  Sensation intact to microfilament     Assessment & Plan:   Problem List Items Addressed This Visit    Dyslipidemia associated with type 2 diabetes mellitus    LDL and triglycerides are at goal on current medications. She has no side effects and liver enzymes are normal. No changes today.  Lab Results  Component Value Date   CHOL 100 01/24/2015   HDL 27.00* 01/24/2015   LDLCALC 49 01/24/2015   LDLDIRECT 63.0 01/24/2015   TRIG 124.0 01/24/2015   CHOLHDL 4 01/24/2015           NASH (nonalcoholic steatohepatitis)    Confirmed with biopsy 2012,.  Up to date on vaccines.  Taking statin for management of hyperlipidemia.  Diabetes and obesity addressed with medications and Low GI diet . LFTS are no longer elevated.  Annual ultrasound has been done.  Lab Results  Component Value Date   ALT 32 01/24/2015   AST 24 01/24/2015   ALKPHOS 48 01/24/2015   BILITOT 0.5 01/24/2015           Type 2 diabetes mellitus, controlled, with renal complications - Primary    Currently well-controlled on current medications .  hemoglobin A1c is 5.7 . Patient is up to date on  annual eye exam and foot exam is normal today. Patient has  microalbuminuria. Patient is tolerating statin therapy for CAD risk reduction and on ACE/ARB for renal protection and hypertension .  Lab Results  Component Value Date   HGBA1C 5.7 01/24/2015   Lab Results  Component Value Date   MICROALBUR 3.4* 01/24/2015         Medicare annual wellness  visit, subsequent    Annual Medicare wellness  exam was done as well as a comprehensive physical exam and management of acute and chronic conditions .  During the course of the visit the patient was educated and counseled about appropriate screening and preventive services including : fall prevention , diabetes screening, nutrition counseling, colorectal cancer screening, and recommended immunizations.  Printed recommendations for health maintenance screenings was given.         A total of 45 minutes was spent with patient more than half of which was spent in counseling patient on the above mentioned issues , reviewing and explaining recent labs and imaging studies done, and coordination of care.  I have discontinued Ms. Scheerer's amoxicillin-clavulanate. I am also having her maintain her aspirin, omeprazole, furosemide, metFORMIN, ibuprofen, simvastatin, potassium chloride, Vitamin D (Ergocalciferol), and losartan.  No orders of the defined types were placed in this encounter.    Medications Discontinued During This Encounter  Medication Reason  . amoxicillin-clavulanate (AUGMENTIN) 875-125 MG per tablet Completed Course  . losartan (COZAAR) 50 MG tablet Duplicate    Follow-up: No Follow-up on file.  [name], MD

## 2015-01-25 NOTE — Progress Notes (Signed)
Pre-visit discussion using our clinic review tool. No additional management support is needed unless otherwise documented below in the visit note.  

## 2015-01-27 ENCOUNTER — Encounter: Payer: Self-pay | Admitting: Internal Medicine

## 2015-01-27 NOTE — Assessment & Plan Note (Signed)
Confirmed with biopsy 2012,.  Up to date on vaccines.  Taking statin for management of hyperlipidemia.  Diabetes and obesity addressed with medications and Low GI diet . LFTS are no longer elevated.  Annual ultrasound has been done.  Lab Results  Component Value Date   ALT 32 01/24/2015   AST 24 01/24/2015   ALKPHOS 48 01/24/2015   BILITOT 0.5 01/24/2015

## 2015-01-27 NOTE — Assessment & Plan Note (Signed)
LDL and triglycerides are at goal on current medications. She has no side effects and liver enzymes are normal. No changes today.  Lab Results  Component Value Date   CHOL 100 01/24/2015   HDL 27.00* 01/24/2015   LDLCALC 49 01/24/2015   LDLDIRECT 63.0 01/24/2015   TRIG 124.0 01/24/2015   CHOLHDL 4 01/24/2015

## 2015-01-27 NOTE — Assessment & Plan Note (Addendum)
Currently well-controlled on current medications .  hemoglobin A1c is 5.7 . Patient is up to date on  annual eye exam and foot exam is normal today. Patient has  microalbuminuria. Patient is tolerating statin therapy for CAD risk reduction and on ACE/ARB for renal protection and hypertension .  Lab Results  Component Value Date   HGBA1C 5.7 01/24/2015   Lab Results  Component Value Date   MICROALBUR 3.4* 01/24/2015

## 2015-01-27 NOTE — Assessment & Plan Note (Signed)

## 2015-02-17 ENCOUNTER — Other Ambulatory Visit: Payer: Self-pay | Admitting: Internal Medicine

## 2015-02-22 ENCOUNTER — Other Ambulatory Visit: Payer: Self-pay | Admitting: Internal Medicine

## 2015-03-07 ENCOUNTER — Other Ambulatory Visit: Payer: Self-pay | Admitting: Surgical

## 2015-03-07 MED ORDER — POTASSIUM CHLORIDE ER 10 MEQ PO TBCR: 10.0000 meq | EXTENDED_RELEASE_TABLET | Freq: Every day | ORAL | Status: DC

## 2015-03-21 ENCOUNTER — Other Ambulatory Visit: Payer: Self-pay | Admitting: *Deleted

## 2015-03-21 DIAGNOSIS — Z1231 Encounter for screening mammogram for malignant neoplasm of breast: Secondary | ICD-10-CM

## 2015-04-14 ENCOUNTER — Other Ambulatory Visit: Payer: Self-pay | Admitting: Internal Medicine

## 2015-05-19 ENCOUNTER — Other Ambulatory Visit: Payer: Self-pay | Admitting: Internal Medicine

## 2015-05-23 ENCOUNTER — Ambulatory Visit
Admission: RE | Admit: 2015-05-23 | Discharge: 2015-05-23 | Disposition: A | Discharge status: Home / Self Care | Payer: BLUE CROSS/BLUE SHIELD | Source: Ambulatory Visit | Attending: General Surgery | Admitting: General Surgery

## 2015-05-23 DIAGNOSIS — Z1231 Encounter for screening mammogram for malignant neoplasm of breast: Secondary | ICD-10-CM | POA: Diagnosis present

## 2015-05-30 ENCOUNTER — Ambulatory Visit: Payer: BLUE CROSS/BLUE SHIELD | Admitting: General Surgery

## 2015-06-05 ENCOUNTER — Encounter: Payer: Self-pay | Admitting: General Surgery

## 2015-06-05 ENCOUNTER — Ambulatory Visit (INDEPENDENT_AMBULATORY_CARE_PROVIDER_SITE_OTHER): Payer: BLUE CROSS/BLUE SHIELD | Admitting: General Surgery

## 2015-06-05 VITALS — BP 126/70 | HR 70 | Resp 12 | Ht 63.0 in | Wt 185.0 lb

## 2015-06-05 DIAGNOSIS — N6019 Diffuse cystic mastopathy of unspecified breast: Secondary | ICD-10-CM

## 2015-06-05 DIAGNOSIS — Z803 Family history of malignant neoplasm of breast: Secondary | ICD-10-CM

## 2015-06-05 NOTE — Progress Notes (Addendum)
Patient ID: Kimberly Huff, female   DOB: Feb 11, 1946, 70 y.o.   MRN: [id number]  Chief Complaint  Patient presents with  . Follow-up    HPI Kimberly Huff is a 70 y.o. female.  who presents for a breast evaluation. The most recent mammogram was done on 05-23-15.  Patient does perform regular self breast checks and gets regular mammograms done.  No new breast issues.   HPI  Past Medical History  Diagnosis Date  . Kidney stone   . Diverticulosis 2005    Dr Jamal Collin  . Coronary artery disease   . Hypertension   . Hyperlipidemia   . Hypopotassemia   . Atrophic kidney     right  . Osteoarthritis   . Coronary atherosclerosis   . Obesity   . Myocardial infarction ([place]) 2000  . Recurrent UTI   . Steatohepatitis 05/30/10    Brunt Stage 3  . Allergy   . Blood transfusion   . Cataract   . Neuromuscular disorder ([place])     "achy muscles"  . External hemorrhoids without mention of complication 6433  . Unspecified essential hypertension 2014  . Heart murmur 2012    found by Dr. Jamal Collin  . H/O cardiac catheterization 2005  . Spinal stenosis of cervical region   . Blood transfusion without reported diagnosis   . Clotting disorder ([facility])     blood clots in abdomen after surgery  . GERD (gastroesophageal reflux disease)   . Ulcer   . Type II or unspecified type diabetes mellitus without mention of complication, uncontrolled     Patient does takes Metformin and recommended to stop for 48 hours.  . Diabetes mellitus     Past Surgical History  Procedure Laterality Date  . Breast mass excision      benign  . Ureterotomy  1960/1969    x 2 during childhood  . Abdominal hysterectomy  1982    complete  . Ptca  2000  . Vaginal hysterectomy      PT DENIES ABD HYSTERECTOMY  . Foot surgery  2008  . Cardiac stents  2000  . Tonsillectomy    . Salpingoophorectomy    . Colonoscopy  2004, 2015    Dr. Jamal Collin, Dr. Olevia Perches  . Eye surgery Bilateral 2012    cataract  . Upper gi endoscopy    .  Cataract extraction      both eyes  . Breast cyst aspiration Right 1990's    benign    Family History  Problem Relation Age of Onset  . Autoimmune disease Daughter   . Thyroid disease Daughter   . Thyroid disease Sister   . Lung cancer Father   . Heart disease Father   . Diverticulosis Mother   . Diabetes Mother   . Stroke Mother 31  . Stomach cancer Maternal Aunt   . Breast cancer Maternal Aunt   . Colon cancer Neg Hx   . Esophageal cancer Neg Hx   . Rectal cancer Neg Hx     Social History Social History  Substance Use Topics  . Smoking status: Former Smoker    Types: Cigarettes    Quit date: 09/15/1998  . Smokeless tobacco: Never Used  . Alcohol Use: Yes     Comment: very rare    No Known Allergies  Current Outpatient Prescriptions  Medication Sig Dispense Refill  . aspirin 325 MG tablet Take by mouth.    . furosemide (LASIX) 40 MG tablet TAKE 1 TABLET BY MOUTH  EVERY DAY 30 tablet 5  . ibuprofen (ADVIL,MOTRIN) 800 MG tablet TAKE 1 TABLET BY MOUTH 3 TIMES A DAY AS NEEDED 90 tablet 5  . losartan (COZAAR) 50 MG tablet TAKE 1 TABLET EVERY DAY 90 tablet 1  . metFORMIN (GLUCOPHAGE) 850 MG tablet Take 1 tablet (850 mg total) by mouth 2 (two) times daily with a meal. 180 tablet 3  . omeprazole (PRILOSEC) 40 MG capsule TAKE 1 CAPSULE (40 MG TOTAL) BY MOUTH DAILY. 30 capsule 11  . potassium chloride (KLOR-CON 10) 10 MEQ tablet Take 1 tablet (10 mEq total) by mouth daily. 90 tablet 1  . simvastatin (ZOCOR) 40 MG tablet TAKE 1 TABLET BY MOUTH EVERY DAY AT 6PM 30 tablet 4  . Vitamin D, Ergocalciferol, (DRISDOL) 50000 units CAPS capsule TAKE 1 CAPSULE (50,000 UNITS TOTAL) BY MOUTH ONCE A WEEK. 4 capsule 2   No current facility-administered medications for this visit.    Review of Systems Review of Systems  Constitutional: Negative.   Respiratory: Negative.   Cardiovascular: Negative.     Blood pressure 126/70, pulse 70, resp. rate 12, height 5' 3"  (1.6 m), weight 185 lb  (83.915 kg).  Physical Exam Physical Exam  Constitutional: She is oriented to person, place, and time. She appears well-developed and well-nourished.  Eyes: Conjunctivae are normal. No scleral icterus.  Neck: Neck supple.  Cardiovascular: Normal rate, regular rhythm and normal heart sounds.   Pulmonary/Chest: Effort normal and breath sounds normal. Right breast exhibits no inverted nipple, no mass, no nipple discharge, no skin change and no tenderness. Left breast exhibits no inverted nipple, no mass, no nipple discharge, no skin change and no tenderness.  Abdominal: Soft. Normal appearance.  Lymphadenopathy:    She has no cervical adenopathy.    She has no axillary adenopathy.  Neurological: She is alert and oriented to person, place, and time.  Skin: Skin is warm and dry.    Data Reviewed Mammogram and old notes reviewed.   Assessment    History of FCD- stable exam.     Plan    Patient to return in 1 year with bilateral screening mammogram. .     PCP:  Deborra Medina  This information has been scribed by Karie Fetch [place].   SANKAR,SEEPLAPUTHUR G 06/15/2015, 10:11 AM

## 2015-06-05 NOTE — Patient Instructions (Addendum)
Continue self breast exams. Call office for any new breast issues or concerns. Return in 1 year.

## 2015-06-18 LAB — HM DIABETES EYE EXAM

## 2015-06-19 ENCOUNTER — Other Ambulatory Visit: Payer: Self-pay | Admitting: Internal Medicine

## 2015-06-20 ENCOUNTER — Encounter: Payer: Self-pay | Admitting: Internal Medicine

## 2015-06-25 ENCOUNTER — Other Ambulatory Visit: Payer: Self-pay | Admitting: Internal Medicine

## 2015-06-27 ENCOUNTER — Ambulatory Visit
Admission: RE | Admit: 2015-06-27 | Discharge: 2015-06-27 | Disposition: A | Discharge status: Home / Self Care | Payer: BLUE CROSS/BLUE SHIELD | Source: Ambulatory Visit | Attending: Internal Medicine | Admitting: Internal Medicine

## 2015-06-27 ENCOUNTER — Encounter: Payer: Self-pay | Admitting: Internal Medicine

## 2015-06-27 ENCOUNTER — Ambulatory Visit (INDEPENDENT_AMBULATORY_CARE_PROVIDER_SITE_OTHER): Payer: BLUE CROSS/BLUE SHIELD | Admitting: Internal Medicine

## 2015-06-27 VITALS — BP 146/72 | HR 73 | Temp 98.0°F | Resp 12 | Ht 63.0 in | Wt 185.0 lb

## 2015-06-27 DIAGNOSIS — E1169 Type 2 diabetes mellitus with other specified complication: Secondary | ICD-10-CM

## 2015-06-27 DIAGNOSIS — J209 Acute bronchitis, unspecified: Secondary | ICD-10-CM

## 2015-06-27 DIAGNOSIS — M25571 Pain in right ankle and joints of right foot: Secondary | ICD-10-CM

## 2015-06-27 DIAGNOSIS — R339 Retention of urine, unspecified: Secondary | ICD-10-CM

## 2015-06-27 DIAGNOSIS — E559 Vitamin D deficiency, unspecified: Secondary | ICD-10-CM

## 2015-06-27 DIAGNOSIS — E1121 Type 2 diabetes mellitus with diabetic nephropathy: Secondary | ICD-10-CM

## 2015-06-27 DIAGNOSIS — R300 Dysuria: Secondary | ICD-10-CM

## 2015-06-27 DIAGNOSIS — S92511A Displaced fracture of proximal phalanx of right lesser toe(s), initial encounter for closed fracture: Secondary | ICD-10-CM | POA: Diagnosis not present

## 2015-06-27 DIAGNOSIS — R059 Cough, unspecified: Secondary | ICD-10-CM

## 2015-06-27 DIAGNOSIS — E785 Hyperlipidemia, unspecified: Secondary | ICD-10-CM

## 2015-06-27 DIAGNOSIS — S92301A Fracture of unspecified metatarsal bone(s), right foot, initial encounter for closed fracture: Secondary | ICD-10-CM | POA: Insufficient documentation

## 2015-06-27 DIAGNOSIS — X58XXXA Exposure to other specified factors, initial encounter: Secondary | ICD-10-CM | POA: Diagnosis not present

## 2015-06-27 DIAGNOSIS — R05 Cough: Secondary | ICD-10-CM | POA: Diagnosis not present

## 2015-06-27 DIAGNOSIS — S92511P Displaced fracture of proximal phalanx of right lesser toe(s), subsequent encounter for fracture with malunion: Secondary | ICD-10-CM

## 2015-06-27 DIAGNOSIS — E119 Type 2 diabetes mellitus without complications: Secondary | ICD-10-CM

## 2015-06-27 LAB — POCT URINALYSIS DIPSTICK
Bilirubin, UA: 0.2
Blood, UA: NEGATIVE
Glucose, UA: NEGATIVE
Ketones, UA: NEGATIVE
Leukocytes, UA: NEGATIVE
Nitrite, UA: NEGATIVE
Protein, UA: NEGATIVE
Spec Grav, UA: >=1.03
Urobilinogen, UA: 0.2
pH, UA: 6

## 2015-06-27 LAB — POCT INFLUENZA A/B
Influenza A, POC: NEGATIVE
Influenza B, POC: NEGATIVE

## 2015-06-27 MED ORDER — HYDROCOD POLST-CPM POLST ER 10-8 MG/5ML PO SUER: 5.0000 mL | Freq: Every evening | ORAL | Status: DC | PRN

## 2015-06-27 MED ORDER — METHYLPREDNISOLONE ACETATE 40 MG/ML IJ SUSP
40.0000 mg | Freq: Once | INTRAMUSCULAR | Status: AC
  Administered 2015-06-27: 40 mg via INTRAMUSCULAR

## 2015-06-27 MED ORDER — LEVOFLOXACIN 500 MG PO TABS: 500.0000 mg | ORAL_TABLET | Freq: Every day | ORAL | Status: DC

## 2015-06-27 MED ORDER — ALBUTEROL SULFATE (2.5 MG/3ML) 0.083% IN NEBU: 2.5000 mg | INHALATION_SOLUTION | Freq: Four times a day (QID) | RESPIRATORY_TRACT | Status: DC | PRN

## 2015-06-27 MED ORDER — BENZONATATE 200 MG PO CAPS
200.0000 mg | ORAL_CAPSULE | Freq: Three times a day (TID) | ORAL | Status: DC | PRN
Start: 2015-06-27 — End: 2015-10-15

## 2015-06-27 MED ORDER — PREDNISONE 10 MG PO TABS: ORAL_TABLET | ORAL | Status: DC

## 2015-06-27 MED ORDER — ALBUTEROL SULFATE HFA 108 (90 BASE) MCG/ACT IN AERS: 2.0000 | INHALATION_SPRAY | Freq: Four times a day (QID) | RESPIRATORY_TRACT | Status: DC | PRN

## 2015-06-27 MED ORDER — ALBUTEROL SULFATE (2.5 MG/3ML) 0.083% IN NEBU
2.5000 mg | INHALATION_SOLUTION | Freq: Once | RESPIRATORY_TRACT | Status: AC
  Administered 2015-06-27: 2.5 mg via RESPIRATORY_TRACT

## 2015-06-27 MED ORDER — IPRATROPIUM BROMIDE 0.02 % IN SOLN
0.5000 mg | Freq: Once | RESPIRATORY_TRACT | Status: AC
  Administered 2015-06-27: 0.5 mg via RESPIRATORY_TRACT

## 2015-06-27 NOTE — Progress Notes (Signed)
Subjective:  Patient ID: Kimberly Huff, female    DOB: 22-May-1945  Age: 70 y.o. MRN: [id number]  CC: The primary encounter diagnosis was Cough. Diagnoses of Dysuria, Pain in joint, ankle and foot, right, Acute bronchitis, unspecified organism, Vitamin D deficiency, Diabetes mellitus without complication ([place]), Hyperlipidemia associated with type 2 diabetes mellitus ([place]), Closed displaced fracture of proximal phalanx of lesser toe of right foot with malunion, Urinary retention, and Controlled type 2 diabetes mellitus with diabetic nephropathy, without long-term current use of insulin ([place]) were also pertinent to this visit.  HPI Kimberly Huff presents for 6 month follow up on DM,  But has multipel acute issues as well  1) foot pain.  Right  2nd 3rd toes .  tripped over a box 3 weeks ago. Had bruising across the toes   2) URI symptoms x 2 weeks,  No fevers,  No intense body aches.  Some sinus drainage and congestion,  Used nettie pott  Took augmentin bid x 5 days.  Wheezing .  Cough kept her up one night used tessalon but ran out .  No better than 2 weeks ago.  Now with multiple flu exposures.  3) polyuria x 3-4 days with urinary retention  And voiding of small amounts UA normal   Lab Results  Component Value Date   HGBA1C 5.7 01/24/2015       Outpatient Prescriptions Prior to Visit  Medication Sig Dispense Refill  . aspirin 325 MG tablet Take by mouth.    . furosemide (LASIX) 40 MG tablet TAKE 1 TABLET BY MOUTH EVERY DAY 30 tablet 5  . ibuprofen (ADVIL,MOTRIN) 800 MG tablet TAKE 1 TABLET BY MOUTH 3 TIMES A DAY AS NEEDED 90 tablet 5  . metFORMIN (GLUCOPHAGE) 850 MG tablet Take 1 tablet (850 mg total) by mouth 2 (two) times daily with a meal. 180 tablet 3  . omeprazole (PRILOSEC) 40 MG capsule TAKE 1 CAPSULE (40 MG TOTAL) BY MOUTH DAILY. 30 capsule 11  . potassium chloride (KLOR-CON 10) 10 MEQ tablet Take 1 tablet (10 mEq total) by mouth daily. 90 tablet 1  . simvastatin (ZOCOR) 40 MG  tablet TAKE 1 TABLET BY MOUTH EVERY DAY AT 6PM 30 tablet 4  . losartan (COZAAR) 50 MG tablet TAKE 1 TABLET EVERY DAY (Patient not taking: Reported on 06/27/2015) 90 tablet 1  . Vitamin D, Ergocalciferol, (DRISDOL) 50000 units CAPS capsule TAKE 1 CAPSULE (50,000 UNITS TOTAL) BY MOUTH ONCE A WEEK. 4 capsule 2   No facility-administered medications prior to visit.    Review of Systems;  Patient denies headache, fevers, malaise, unintentional weight loss, skin rash, eye pain, sinus congestion and sinus pain, sore throat, dysphagia,  hemoptysis , cough, dyspnea, wheezing, chest pain, palpitations, orthopnea, edema, abdominal pain, nausea, melena, diarrhea, constipation, flank pain, dysuria, hematuria, urinary  Frequency, nocturia, numbness, tingling, seizures,  Focal weakness, Loss of consciousness,  Tremor, insomnia, depression, anxiety, and suicidal ideation.      Objective:  BP 146/72 mmHg  Pulse 73  Temp(Src) 98 F (36.7 C) (Oral)  Resp 12  Ht 5' 3"  (1.6 m)  Wt 185 lb (83.915 kg)  BMI 32.78 kg/m2  SpO2 96%  BP Readings from Last 3 Encounters:  06/27/15 146/72  06/05/15 126/70  01/25/15 118/70    Wt Readings from Last 3 Encounters:  06/27/15 185 lb (83.915 kg)  06/05/15 185 lb (83.915 kg)  01/25/15 189 lb 2 oz (85.787 kg)    General appearance: alert, cooperative and  appears stated age Ears: normal TM's and external ear canals both ears Throat: lips, mucosa, and tongue normal; teeth and gums normal Neck: no adenopathy, no carotid bruit, supple, symmetrical, trachea midline and thyroid not enlarged, symmetric, no tenderness/mass/nodules Back: symmetric, no curvature. ROM normal. No CVA tenderness. Lungs: clear to auscultation bilaterally Heart: regular rate and rhythm, S1, S2 normal, no murmur, click, rub or gallop Abdomen: soft, non-tender; bowel sounds normal; no masses,  no organomegaly Pulses: 2+ and symmetric Skin: Skin color, texture, turgor normal. No rashes or  lesions Lymph nodes: Cervical, supraclavicular, and axillary nodes normal.  Lab Results  Component Value Date   HGBA1C 5.7 01/24/2015   HGBA1C 9.0* 09/10/2014   HGBA1C 7.0* 03/07/2014    Lab Results  Component Value Date   CREATININE 0.64 01/24/2015   CREATININE 0.71 09/10/2014   CREATININE 0.7 03/07/2014    Lab Results  Component Value Date   WBC 6.2 09/10/2014   HGB 13.3 09/10/2014   HCT 38.9 09/10/2014   PLT 144.0* 09/10/2014   GLUCOSE 117* 01/24/2015   CHOL 100 01/24/2015   TRIG 124.0 01/24/2015   HDL 27.00* 01/24/2015   LDLDIRECT 63.0 01/24/2015   LDLCALC 49 01/24/2015   ALT 32 01/24/2015   AST 24 01/24/2015   NA 141 01/24/2015   K 4.9 01/24/2015   CL 105 01/24/2015   CREATININE 0.64 01/24/2015   BUN 19 01/24/2015   CO2 29 01/24/2015   TSH 0.76 09/10/2014   INR 1.0 09/10/2014   HGBA1C 5.7 01/24/2015   MICROALBUR 3.4* 01/24/2015    Mm Digital Screening  05/23/2015  CLINICAL DATA:  Screening. EXAM: DIGITAL SCREENING BILATERAL MAMMOGRAM WITH CAD COMPARISON:  Previous exam(s). ACR Breast Density Category c: The breast tissue is heterogeneously dense, which may obscure small masses. FINDINGS: There are no findings suspicious for malignancy. Images were processed with CAD. IMPRESSION: No mammographic evidence of malignancy. A result letter of this screening mammogram will be mailed directly to the patient. RECOMMENDATION: Screening mammogram in one year. (Code:SM-B-01Y) BI-RADS CATEGORY  1: Negative. Electronically Signed   By: Franki Cabot M.D.   On: 05/23/2015 08:42    Assessment & Plan:   Problem List Items Addressed This Visit    Type 2 diabetes mellitus, controlled, with renal complications ([place])    Currently well-controlled on current medications .  LAST hemoglobin A1c WAS 5.7 . Patient is up to date on  annual eye exam and foot exam is normal today. Patient has  microalbuminuria. Patient is tolerating statin therapy for CAD risk reduction and on ACE/ARB for  renal protection and hypertension .  Lab Results  Component Value Date   HGBA1C 5.7 01/24/2015   Lab Results  Component Value Date   MICROALBUR 3.4* 01/24/2015           Acute bronchitis    Chest x ray negative for pneumonia . Prednisone taper,  Albuterol MDI,  Tussionex,  And levaquin,  Probiotic advised      Relevant Medications   levofloxacin (LEVAQUIN) 500 MG tablet   albuterol (PROVENTIL HFA;VENTOLIN HFA) 108 (90 Base) MCG/ACT inhaler   predniSONE (DELTASONE) 10 MG tablet   Other Relevant Orders   DG Chest 2 View (Completed)   CBC with Differential/Platelet   Closed displaced fracture of proximal phalanx of lesser toe of right foot with malunion    By palin films,  By extension from  distal phalanx.  Hard sole shoe and podiatry referral in process.       Relevant  Orders   Ambulatory referral to Podiatry   Urinary retention    Resolved, but likely secondary to cold medications       Diabetes mellitus without complication (HCC)   Relevant Orders   Comprehensive metabolic panel   Hemoglobin A1c   Lipid panel   Microalbumin / creatinine urine ratio   Vitamin D deficiency   Relevant Orders   VITAMIN D 25 Hydroxy (Vit-D Deficiency, Fractures)    Other Visit Diagnoses    Cough    -  Primary    Relevant Medications    ipratropium (ATROVENT) nebulizer solution 0.5 mg (Completed)    methylPREDNISolone acetate (DEPO-MEDROL) injection 40 mg (Completed)    albuterol (PROVENTIL) (2.5 MG/3ML) 0.083% nebulizer solution 2.5 mg (Completed)    Other Relevant Orders    POCT Influenza A/B (Completed)    Dysuria        Relevant Orders    POCT Urinalysis Dipstick (Completed)    Pain in joint, ankle and foot, right        Relevant Orders    DG Foot Complete Right (Completed)    Hyperlipidemia associated with type 2 diabetes mellitus ([place])        Relevant Orders    LDL cholesterol, direct       I have discontinued Ms. Stumph's Vitamin D (Ergocalciferol) and albuterol. I  am also having her start on chlorpheniramine-HYDROcodone, benzonatate, levofloxacin, albuterol, and predniSONE. Additionally, I am having her maintain her aspirin, metFORMIN, ibuprofen, furosemide, potassium chloride, simvastatin, omeprazole, and losartan. We administered ipratropium, methylPREDNISolone acetate, and albuterol.  Meds ordered this encounter  Medications  . chlorpheniramine-HYDROcodone (TUSSIONEX PENNKINETIC ER) 10-8 MG/5ML SUER    Sig: Take 5 mLs by mouth at bedtime as needed for cough.    Dispense:  140 mL    Refill:  0  . benzonatate (TESSALON) 200 MG capsule    Sig: Take 1 capsule (200 mg total) by mouth 3 (three) times daily as needed for cough.    Dispense:  60 capsule    Refill:  1  . DISCONTD: albuterol (PROVENTIL) (2.5 MG/3ML) 0.083% nebulizer solution    Sig: Take 3 mLs (2.5 mg total) by nebulization every 6 (six) hours as needed for wheezing or shortness of breath.    Dispense:  75 mL    Refill:  12  . ipratropium (ATROVENT) nebulizer solution 0.5 mg    Sig:   . methylPREDNISolone acetate (DEPO-MEDROL) injection 40 mg    Sig:   . albuterol (PROVENTIL) (2.5 MG/3ML) 0.083% nebulizer solution 2.5 mg    Sig:   . levofloxacin (LEVAQUIN) 500 MG tablet    Sig: Take 1 tablet (500 mg total) by mouth daily.    Dispense:  7 tablet    Refill:  0  . albuterol (PROVENTIL HFA;VENTOLIN HFA) 108 (90 Base) MCG/ACT inhaler    Sig: Inhale 2 puffs into the lungs every 6 (six) hours as needed for wheezing or shortness of breath. USE WITH SPACER    Dispense:  1 Inhaler    Refill:  11    PLEASE PROVIDE SPACER TO USE WITH MDI  . predniSONE (DELTASONE) 10 MG tablet    Sig: 6 tablets on Day 1 , then reduce by 1 tablet daily until gone    Dispense:  21 tablet    Refill:  0   A total of 40 minutes was spent with patient more than half of which was spent in counseling patient on the above mentioned issues , reviewing and  explaining recent labs and imaging studies done, and coordination  of care.   Medications Discontinued During This Encounter  Medication Reason  . Vitamin D, Ergocalciferol, (DRISDOL) 50000 units CAPS capsule Completed Course  . albuterol (PROVENTIL) (2.5 MG/3ML) 0.083% nebulizer solution     Follow-up: Return in about 3 months (around 09/24/2015) for follow up diabetes.   [name], MD

## 2015-06-27 NOTE — Assessment & Plan Note (Addendum)
By palin films,  By extension from  distal phalanx.  Hard sole shoe and podiatry referral in process.

## 2015-06-27 NOTE — Patient Instructions (Addendum)
YOUR URINARY RETENTION WAS A TEMPORARY SIDE EFFECT OF THE SUDAFED  YOU DO NOT HAVE A UTI   I have ordered a palin chest  X ray and one of the right foot to rule out fracture and pneumonia   I will call in  meds once I see your x ray    To make a low carb chip :  Take the [employer] Lav[employer] or Pita bread,  Or the [place] Low carb whole wheat tortilla   Place on metal cookie sheet  Brush with olive oil  Sprinkle garlic powder (NOT garlic salt), grated parmesan cheese, mediterranean seasoning , or all of them?  Bake at 225 or 250 for 90 minutes

## 2015-06-27 NOTE — Progress Notes (Signed)
Pre-visit discussion using our clinic review tool. No additional management support is needed unless otherwise documented below in the visit note.  

## 2015-06-27 NOTE — Assessment & Plan Note (Addendum)
Chest x ray negative for pneumonia . Prednisone taper,  Albuterol MDI,  Tussionex,  And levaquin,  Probiotic advised

## 2015-06-28 ENCOUNTER — Encounter: Payer: Self-pay | Admitting: Gastroenterology

## 2015-06-29 DIAGNOSIS — R809 Proteinuria, unspecified: Secondary | ICD-10-CM

## 2015-06-29 DIAGNOSIS — E1129 Type 2 diabetes mellitus with other diabetic kidney complication: Secondary | ICD-10-CM | POA: Insufficient documentation

## 2015-06-29 DIAGNOSIS — R339 Retention of urine, unspecified: Secondary | ICD-10-CM | POA: Insufficient documentation

## 2015-06-29 NOTE — Assessment & Plan Note (Signed)
Currently well-controlled on current medications .  LAST hemoglobin A1c WAS 5.7 . Patient is up to date on  annual eye exam and foot exam is normal today. Patient has  microalbuminuria. Patient is tolerating statin therapy for CAD risk reduction and on ACE/ARB for renal protection and hypertension .  Lab Results  Component Value Date   HGBA1C 5.7 01/24/2015   Lab Results  Component Value Date   MICROALBUR 3.4* 01/24/2015

## 2015-06-29 NOTE — Assessment & Plan Note (Signed)
Resolved, but likely secondary to cold medications

## 2015-07-02 ENCOUNTER — Encounter: Payer: Self-pay | Admitting: Internal Medicine

## 2015-07-09 ENCOUNTER — Encounter: Payer: Self-pay | Admitting: Internal Medicine

## 2015-08-08 ENCOUNTER — Encounter: Payer: Self-pay | Admitting: Podiatry

## 2015-08-26 ENCOUNTER — Ambulatory Visit (INDEPENDENT_AMBULATORY_CARE_PROVIDER_SITE_OTHER): Payer: BLUE CROSS/BLUE SHIELD | Admitting: Nurse Practitioner

## 2015-08-26 ENCOUNTER — Encounter: Payer: Self-pay | Admitting: Nurse Practitioner

## 2015-08-26 VITALS — BP 116/68 | HR 86 | Temp 98.3°F | Resp 14 | Ht 63.0 in | Wt 185.0 lb

## 2015-08-26 DIAGNOSIS — R300 Dysuria: Secondary | ICD-10-CM

## 2015-08-26 LAB — URINALYSIS, ROUTINE W REFLEX MICROSCOPIC
Bilirubin Urine: NEGATIVE
Hgb urine dipstick: NEGATIVE
Ketones, ur: NEGATIVE
Nitrite: NEGATIVE
Specific Gravity, Urine: 1.01 (ref 1.000–1.030)
Total Protein, Urine: NEGATIVE
Urine Glucose: NEGATIVE
Urobilinogen, UA: 0.2 (ref 0.0–1.0)
pH: 5.5 (ref 5.0–8.0)

## 2015-08-26 LAB — POCT URINALYSIS DIP (MANUAL ENTRY)
Bilirubin, UA: NEGATIVE
Blood, UA: NEGATIVE
Glucose, UA: NEGATIVE
Ketones, POC UA: NEGATIVE
Nitrite, UA: NEGATIVE
Protein Ur, POC: NEGATIVE
Spec Grav, UA: 1.01
Urobilinogen, UA: 0.2
pH, UA: 5.5

## 2015-08-26 MED ORDER — NITROFURANTOIN MONOHYD MACRO 100 MG PO CAPS: 100.0000 mg | ORAL_CAPSULE | Freq: Two times a day (BID) | ORAL | Status: DC

## 2015-08-26 NOTE — Assessment & Plan Note (Signed)
Pt couldn't afford fosfomycin- sending in Macrobid to pharmacy BID x 5 days. Obtain culture. FU prn worsening/failure to improve.

## 2015-08-26 NOTE — Progress Notes (Signed)
Patient ID: Kimberly Huff, female    DOB: [birth year]  Age: 70 y.o. MRN: [id number]  CC: Dysuria   HPI Kimberly Huff presents for CC of Dysuria x 4 days.   1) Fosfomycin given to pt to take and she didn't get it because it was $80. [facility].  Tmax- 102 "Hurt everywhere"  Pressure with urination  No OTC treatment to date  History Kimberly Huff has a past medical history of Kidney stone; Diverticulosis (2005); Coronary artery disease; Hypertension; Hyperlipidemia; Hypopotassemia; Atrophic kidney; Osteoarthritis; Coronary atherosclerosis; Obesity; Myocardial infarction ([place]) (2000); Recurrent UTI; Steatohepatitis (05/30/10); Allergy; Blood transfusion; Cataract; Neuromuscular disorder ([place]); External hemorrhoids without mention of complication (7209); Unspecified essential hypertension (2014); Heart murmur (2012); H/O cardiac catheterization (2005); Spinal stenosis of cervical region; Blood transfusion without reported diagnosis; Clotting disorder ([place]); GERD (gastroesophageal reflux disease); Ulcer; Type II or unspecified type diabetes mellitus without mention of complication, uncontrolled; and Diabetes mellitus.   She has past surgical history that includes Breast mass excision; Ureterotomy (1960/1969); Abdominal hysterectomy (1982); Mitral valve replacement (2000); Vaginal hysterectomy; Foot surgery (2008); cardiac stents (2000); Tonsillectomy; Salpingoophorectomy; Colonoscopy (2004, 2015); Eye surgery (Bilateral, 2012); Upper gi endoscopy; Cataract extraction; and Breast cyst aspiration (Right, 1990's).   Her family history includes Autoimmune disease in her daughter; Breast cancer in her maternal aunt; Diabetes in her mother; Diverticulosis in her mother; Heart disease in her father; Lung cancer in her father; Stomach cancer in her maternal aunt; Stroke (age of onset: 36) in her mother; Thyroid disease in her daughter and sister. There is no history  of Colon cancer, Esophageal cancer, or Rectal cancer.She reports that she quit smoking about 16 years ago. Her smoking use included Cigarettes. She has never used smokeless tobacco. She reports that she drinks alcohol. She reports that she does not use illicit drugs.  Outpatient Prescriptions Prior to Visit  Medication Sig Dispense Refill  . albuterol (PROVENTIL HFA;VENTOLIN HFA) 108 (90 Base) MCG/ACT inhaler Inhale 2 puffs into the lungs every 6 (six) hours as needed for wheezing or shortness of breath. USE WITH SPACER 1 Inhaler 11  . aspirin 325 MG tablet Take by mouth.    . benzonatate (TESSALON) 200 MG capsule Take 1 capsule (200 mg total) by mouth 3 (three) times daily as needed for cough. 60 capsule 1  . furosemide (LASIX) 40 MG tablet TAKE 1 TABLET BY MOUTH EVERY DAY 30 tablet 5  . ibuprofen (ADVIL,MOTRIN) 800 MG tablet TAKE 1 TABLET BY MOUTH 3 TIMES A DAY AS NEEDED 90 tablet 5  . losartan (COZAAR) 50 MG tablet TAKE 1 TABLET EVERY DAY 90 tablet 1  . metFORMIN (GLUCOPHAGE) 850 MG tablet Take 1 tablet (850 mg total) by mouth 2 (two) times daily with a meal. 180 tablet 3  . omeprazole (PRILOSEC) 40 MG capsule TAKE 1 CAPSULE (40 MG TOTAL) BY MOUTH DAILY. 30 capsule 11  . potassium chloride (KLOR-CON 10) 10 MEQ tablet Take 1 tablet (10 mEq total) by mouth daily. 90 tablet 1  . simvastatin (ZOCOR) 40 MG tablet TAKE 1 TABLET BY MOUTH EVERY DAY AT 6PM 30 tablet 4  . chlorpheniramine-HYDROcodone (TUSSIONEX PENNKINETIC ER) 10-8 MG/5ML SUER Take 5 mLs by mouth at bedtime as needed for cough. (Patient not taking: Reported on 08/26/2015) 140 mL 0  . levofloxacin (LEVAQUIN) 500 MG tablet Take 1 tablet (500 mg total) by mouth daily. (Patient not taking: Reported on 08/26/2015) 7 tablet 0  .  predniSONE (DELTASONE) 10 MG tablet 6 tablets on Day 1 , then reduce by 1 tablet daily until gone (Patient not taking: Reported on 08/26/2015) 21 tablet 0   No facility-administered medications prior to visit.     ROS Review of Systems  Constitutional: Negative for fever, chills, diaphoresis and fatigue.  Gastrointestinal: Negative for abdominal pain and abdominal distention.  Genitourinary: Positive for dysuria. Negative for urgency, frequency, hematuria, flank pain, decreased urine volume, vaginal bleeding, vaginal discharge, difficulty urinating, vaginal pain and pelvic pain.  Skin: Negative for rash.    Objective:  BP 116/68 mmHg  Pulse 86  Temp(Src) 98.3 F (36.8 C) (Oral)  Resp 14  Ht 5' 3"  (1.6 m)  Wt 185 lb (83.915 kg)  BMI 32.78 kg/m2  SpO2 97%  Physical Exam  Constitutional: She is oriented to person, place, and time. She appears well-developed and well-nourished. No distress.  HENT:  Head: Normocephalic and atraumatic.  Right Ear: External ear normal.  Left Ear: External ear normal.  Abdominal: There is no CVA tenderness.  Neurological: She is alert and oriented to person, place, and time.  Skin: Skin is warm and dry. No rash noted. She is not diaphoretic.  Psychiatric: She has a normal mood and affect. Her behavior is normal. Judgment and thought content normal.   Assessment & Plan:   Kimberly Huff was seen today for dysuria.  Diagnoses and all orders for this visit:  Dysuria -     POCT urinalysis dipstick -     Urine culture -     Urinalysis, Routine w reflex microscopic  Other orders -     nitrofurantoin, macrocrystal-monohydrate, (MACROBID) 100 MG capsule; Take 1 capsule (100 mg total) by mouth 2 (two) times daily.   I have discontinued Kimberly Huff's chlorpheniramine-HYDROcodone, levofloxacin, and predniSONE. I am also having her start on nitrofurantoin (macrocrystal-monohydrate). Additionally, I am having her maintain her aspirin, metFORMIN, ibuprofen, furosemide, potassium chloride, simvastatin, omeprazole, losartan, benzonatate, and albuterol.  Meds ordered this encounter  Medications  . nitrofurantoin, macrocrystal-monohydrate, (MACROBID) 100 MG capsule    Sig:  Take 1 capsule (100 mg total) by mouth 2 (two) times daily.    Dispense:  10 capsule    Refill:  0    Order Specific Question:  Supervising Provider    Answer:  [name] [[id number]     Follow-up: Return if symptoms worsen or fail to improve.

## 2015-08-26 NOTE — Patient Instructions (Signed)

## 2015-08-28 ENCOUNTER — Other Ambulatory Visit: Payer: Self-pay | Admitting: Internal Medicine

## 2015-08-30 ENCOUNTER — Other Ambulatory Visit: Payer: Self-pay | Admitting: Internal Medicine

## 2015-09-02 ENCOUNTER — Ambulatory Visit: Payer: BLUE CROSS/BLUE SHIELD | Admitting: Podiatry

## 2015-09-10 ENCOUNTER — Other Ambulatory Visit: Payer: Self-pay | Admitting: Internal Medicine

## 2015-10-15 ENCOUNTER — Encounter
Admission: EM | Disposition: A | Discharge status: Home / Self Care | Payer: Self-pay | Source: Home / Self Care | Attending: Internal Medicine

## 2015-10-15 ENCOUNTER — Emergency Department: Payer: BLUE CROSS/BLUE SHIELD

## 2015-10-15 ENCOUNTER — Inpatient Hospital Stay: Payer: BLUE CROSS/BLUE SHIELD | Admitting: Anesthesiology

## 2015-10-15 ENCOUNTER — Encounter: Payer: Self-pay | Admitting: Emergency Medicine

## 2015-10-15 ENCOUNTER — Inpatient Hospital Stay
Admission: EM | Admit: 2015-10-15 | Discharge: 2015-10-18 | DRG: 872 | Disposition: A | Discharge status: Home / Self Care | Payer: BLUE CROSS/BLUE SHIELD | Attending: Internal Medicine | Admitting: Internal Medicine

## 2015-10-15 ENCOUNTER — Encounter: Payer: Self-pay | Admitting: Internal Medicine

## 2015-10-15 DIAGNOSIS — Z823 Family history of stroke: Secondary | ICD-10-CM | POA: Diagnosis not present

## 2015-10-15 DIAGNOSIS — Z6832 Body mass index (BMI) 32.0-32.9, adult: Secondary | ICD-10-CM | POA: Diagnosis not present

## 2015-10-15 DIAGNOSIS — I10 Essential (primary) hypertension: Secondary | ICD-10-CM | POA: Diagnosis present

## 2015-10-15 DIAGNOSIS — Z833 Family history of diabetes mellitus: Secondary | ICD-10-CM

## 2015-10-15 DIAGNOSIS — N132 Hydronephrosis with renal and ureteral calculous obstruction: Secondary | ICD-10-CM | POA: Diagnosis present

## 2015-10-15 DIAGNOSIS — N3000 Acute cystitis without hematuria: Secondary | ICD-10-CM | POA: Diagnosis present

## 2015-10-15 DIAGNOSIS — R1032 Left lower quadrant pain: Secondary | ICD-10-CM

## 2015-10-15 DIAGNOSIS — Z87891 Personal history of nicotine dependence: Secondary | ICD-10-CM

## 2015-10-15 DIAGNOSIS — I252 Old myocardial infarction: Secondary | ICD-10-CM

## 2015-10-15 DIAGNOSIS — E669 Obesity, unspecified: Secondary | ICD-10-CM | POA: Diagnosis present

## 2015-10-15 DIAGNOSIS — I959 Hypotension, unspecified: Secondary | ICD-10-CM | POA: Diagnosis present

## 2015-10-15 DIAGNOSIS — Z8249 Family history of ischemic heart disease and other diseases of the circulatory system: Secondary | ICD-10-CM | POA: Diagnosis not present

## 2015-10-15 DIAGNOSIS — R112 Nausea with vomiting, unspecified: Secondary | ICD-10-CM

## 2015-10-15 DIAGNOSIS — A419 Sepsis, unspecified organism: Principal | ICD-10-CM | POA: Diagnosis present

## 2015-10-15 DIAGNOSIS — I2510 Atherosclerotic heart disease of native coronary artery without angina pectoris: Secondary | ICD-10-CM | POA: Diagnosis present

## 2015-10-15 DIAGNOSIS — E119 Type 2 diabetes mellitus without complications: Secondary | ICD-10-CM | POA: Diagnosis present

## 2015-10-15 DIAGNOSIS — Z7982 Long term (current) use of aspirin: Secondary | ICD-10-CM

## 2015-10-15 DIAGNOSIS — N201 Calculus of ureter: Secondary | ICD-10-CM

## 2015-10-15 DIAGNOSIS — Z801 Family history of malignant neoplasm of trachea, bronchus and lung: Secondary | ICD-10-CM

## 2015-10-15 DIAGNOSIS — Z803 Family history of malignant neoplasm of breast: Secondary | ICD-10-CM

## 2015-10-15 DIAGNOSIS — K219 Gastro-esophageal reflux disease without esophagitis: Secondary | ICD-10-CM | POA: Diagnosis present

## 2015-10-15 DIAGNOSIS — E785 Hyperlipidemia, unspecified: Secondary | ICD-10-CM | POA: Diagnosis present

## 2015-10-15 DIAGNOSIS — N261 Atrophy of kidney (terminal): Secondary | ICD-10-CM | POA: Diagnosis present

## 2015-10-15 DIAGNOSIS — Z87440 Personal history of urinary (tract) infections: Secondary | ICD-10-CM

## 2015-10-15 DIAGNOSIS — N200 Calculus of kidney: Secondary | ICD-10-CM

## 2015-10-15 DIAGNOSIS — N3289 Other specified disorders of bladder: Secondary | ICD-10-CM | POA: Diagnosis present

## 2015-10-15 DIAGNOSIS — R109 Unspecified abdominal pain: Secondary | ICD-10-CM

## 2015-10-15 DIAGNOSIS — Z800 Family history of malignant neoplasm of digestive organs: Secondary | ICD-10-CM | POA: Diagnosis not present

## 2015-10-15 DIAGNOSIS — Z955 Presence of coronary angioplasty implant and graft: Secondary | ICD-10-CM | POA: Diagnosis not present

## 2015-10-15 DIAGNOSIS — N390 Urinary tract infection, site not specified: Secondary | ICD-10-CM

## 2015-10-15 HISTORY — PX: CYSTOSCOPY WITH URETEROSCOPY AND STENT PLACEMENT: SHX6377

## 2015-10-15 LAB — GLUCOSE, CAPILLARY: Glucose-Capillary: 165 mg/dL — ABNORMAL HIGH (ref 65–99)

## 2015-10-15 LAB — URINALYSIS COMPLETE WITH MICROSCOPIC (ARMC ONLY)
Bilirubin Urine: NEGATIVE
Glucose, UA: NEGATIVE mg/dL
Ketones, ur: NEGATIVE mg/dL
Nitrite: NEGATIVE
Protein, ur: NEGATIVE mg/dL
Specific Gravity, Urine: 1.016 (ref 1.005–1.030)
pH: 5 (ref 5.0–8.0)

## 2015-10-15 LAB — COMPREHENSIVE METABOLIC PANEL
ALT: 50 U/L (ref 14–54)
AST: 36 U/L (ref 15–41)
Albumin: 4.9 g/dL (ref 3.5–5.0)
Alkaline Phosphatase: 73 U/L (ref 38–126)
Anion gap: 13 (ref 5–15)
BUN: 21 mg/dL — ABNORMAL HIGH (ref 6–20)
CO2: 23 mmol/L (ref 22–32)
Calcium: 10 mg/dL (ref 8.9–10.3)
Chloride: 97 mmol/L — ABNORMAL LOW (ref 101–111)
Creatinine, Ser: 0.73 mg/dL (ref 0.44–1.00)
GFR calc Af Amer: >60 mL/min (ref >60)
GFR calc non Af Amer: >60 mL/min (ref >60)
Glucose, Bld: 147 mg/dL — ABNORMAL HIGH (ref 65–99)
Potassium: 4.2 mmol/L (ref 3.5–5.1)
Sodium: 133 mmol/L — ABNORMAL LOW (ref 135–145)
Total Bilirubin: 0.6 mg/dL (ref 0.3–1.2)
Total Protein: 7.7 g/dL (ref 6.5–8.1)

## 2015-10-15 LAB — CBC
HCT: 42.4 % (ref 35.0–47.0)
Hemoglobin: 14.1 g/dL (ref 12.0–16.0)
MCH: 27.4 pg (ref 26.0–34.0)
MCHC: 33.1 g/dL (ref 32.0–36.0)
MCV: 82.6 fL (ref 80.0–100.0)
Platelets: 151 10*3/uL (ref 150–440)
RBC: 5.14 MIL/uL (ref 3.80–5.20)
RDW: 13.2 % (ref 11.5–14.5)
WBC: 14.5 10*3/uL — ABNORMAL HIGH (ref 3.6–11.0)

## 2015-10-15 LAB — LIPASE, BLOOD: Lipase: 33 U/L (ref 11–51)

## 2015-10-15 SURGERY — CYSTOURETEROSCOPY, WITH STENT INSERTION
Anesthesia: General | Site: Ureter | Laterality: Left | Wound class: Clean Contaminated

## 2015-10-15 MED ORDER — BELLADONNA ALKALOIDS-OPIUM 16.2-60 MG RE SUPP
RECTAL | Status: AC
  Filled 2015-10-15: qty 1

## 2015-10-15 MED ORDER — ALBUTEROL SULFATE HFA 108 (90 BASE) MCG/ACT IN AERS: 2.0000 | INHALATION_SPRAY | Freq: Four times a day (QID) | RESPIRATORY_TRACT | Status: DC | PRN

## 2015-10-15 MED ORDER — DEXTROSE 5 % IV SOLN
1.0000 g | Freq: Once | INTRAVENOUS | Status: AC
  Administered 2015-10-15: 1 g via INTRAVENOUS
  Filled 2015-10-15 (×2): qty 10

## 2015-10-15 MED ORDER — SUCCINYLCHOLINE CHLORIDE 20 MG/ML IJ SOLN
INTRAMUSCULAR | Status: DC | PRN
  Administered 2015-10-15: 100 mg via INTRAVENOUS

## 2015-10-15 MED ORDER — HYDROMORPHONE HCL 1 MG/ML IJ SOLN
1.0000 mg | INTRAMUSCULAR | Status: DC | PRN
  Administered 2015-10-16 – 2015-10-18 (×6): 1 mg via INTRAVENOUS
  Filled 2015-10-15 (×7): qty 1

## 2015-10-15 MED ORDER — INSULIN ASPART 100 UNIT/ML ~~LOC~~ SOLN
0.0000 [IU] | Freq: Three times a day (TID) | SUBCUTANEOUS | Status: DC
  Filled 2015-10-15: qty 0.09
  Filled 2015-10-15: qty 2
  Filled 2015-10-15 (×2): qty 0.09
  Filled 2015-10-15: qty 2

## 2015-10-15 MED ORDER — MORPHINE SULFATE (PF) 4 MG/ML IV SOLN
INTRAVENOUS | Status: AC
  Filled 2015-10-15: qty 1

## 2015-10-15 MED ORDER — DEXTROSE 5 % IV SOLN
1.0000 g | INTRAVENOUS | Status: DC
  Administered 2015-10-15 – 2015-10-17 (×3): 1 g via INTRAVENOUS
  Filled 2015-10-15 (×4): qty 10

## 2015-10-15 MED ORDER — SIMVASTATIN 40 MG PO TABS
40.0000 mg | ORAL_TABLET | Freq: Every evening | ORAL | Status: DC
  Administered 2015-10-16 – 2015-10-17 (×2): 40 mg via ORAL
  Filled 2015-10-15 (×2): qty 1

## 2015-10-15 MED ORDER — LOSARTAN POTASSIUM 50 MG PO TABS
50.0000 mg | ORAL_TABLET | Freq: Every day | ORAL | Status: DC
  Administered 2015-10-17 – 2015-10-18 (×2): 50 mg via ORAL
  Filled 2015-10-15 (×4): qty 1

## 2015-10-15 MED ORDER — FENTANYL CITRATE (PF) 100 MCG/2ML IJ SOLN
INTRAMUSCULAR | Status: DC | PRN
  Administered 2015-10-15: 50 ug via INTRAVENOUS

## 2015-10-15 MED ORDER — PROPOFOL 10 MG/ML IV BOLUS
INTRAVENOUS | Status: DC | PRN
  Administered 2015-10-15: 150 mg via INTRAVENOUS

## 2015-10-15 MED ORDER — SODIUM CHLORIDE 0.9 % IV BOLUS (SEPSIS)
1000.0000 mL | Freq: Once | INTRAVENOUS | Status: AC
  Administered 2015-10-15: 1000 mL via INTRAVENOUS

## 2015-10-15 MED ORDER — MORPHINE SULFATE (PF) 4 MG/ML IV SOLN
4.0000 mg | Freq: Once | INTRAVENOUS | Status: AC
  Administered 2015-10-15: 4 mg via INTRAVENOUS
  Filled 2015-10-15: qty 1

## 2015-10-15 MED ORDER — IOPAMIDOL (ISOVUE-300) INJECTION 61%
INTRAVENOUS | Status: DC | PRN
  Administered 2015-10-15: 10 mL via INTRAVENOUS

## 2015-10-15 MED ORDER — HYDROMORPHONE HCL 1 MG/ML IJ SOLN
INTRAMUSCULAR | Status: AC
  Filled 2015-10-15: qty 1

## 2015-10-15 MED ORDER — ASPIRIN EC 325 MG PO TBEC
325.0000 mg | DELAYED_RELEASE_TABLET | Freq: Every day | ORAL | Status: DC
  Administered 2015-10-16 – 2015-10-18 (×3): 325 mg via ORAL
  Filled 2015-10-15 (×3): qty 1

## 2015-10-15 MED ORDER — PANTOPRAZOLE SODIUM 40 MG PO TBEC
40.0000 mg | DELAYED_RELEASE_TABLET | Freq: Every day | ORAL | Status: DC
  Administered 2015-10-16 – 2015-10-18 (×3): 40 mg via ORAL
  Filled 2015-10-15 (×3): qty 1

## 2015-10-15 MED ORDER — FENTANYL CITRATE (PF) 100 MCG/2ML IJ SOLN: 25.0000 ug | INTRAMUSCULAR | Status: DC | PRN

## 2015-10-15 MED ORDER — MORPHINE SULFATE (PF) 4 MG/ML IV SOLN: 4.0000 mg | Freq: Once | INTRAVENOUS | Status: DC

## 2015-10-15 MED ORDER — LACTATED RINGERS IV SOLN
INTRAVENOUS | Status: DC | PRN
  Administered 2015-10-15: 22:00:00 via INTRAVENOUS

## 2015-10-15 MED ORDER — LIDOCAINE HCL (CARDIAC) 20 MG/ML IV SOLN
INTRAVENOUS | Status: DC | PRN
  Administered 2015-10-15: 30 mg via INTRAVENOUS

## 2015-10-15 MED ORDER — ONDANSETRON HCL 4 MG/2ML IJ SOLN: 4.0000 mg | Freq: Once | INTRAMUSCULAR | Status: DC | PRN

## 2015-10-15 MED ORDER — ONDANSETRON HCL 4 MG/2ML IJ SOLN
4.0000 mg | Freq: Once | INTRAMUSCULAR | Status: AC
  Administered 2015-10-15: 4 mg via INTRAVENOUS
  Filled 2015-10-15: qty 2

## 2015-10-15 MED ORDER — LIDOCAINE HCL 2 % EX GEL
CUTANEOUS | Status: AC
  Filled 2015-10-15: qty 5

## 2015-10-15 MED ORDER — HYDROMORPHONE HCL 1 MG/ML IJ SOLN
1.0000 mg | Freq: Once | INTRAMUSCULAR | Status: AC
  Administered 2015-10-15: 1 mg via INTRAVENOUS

## 2015-10-15 MED ORDER — IBUPROFEN 800 MG PO TABS
800.0000 mg | ORAL_TABLET | Freq: Every day | ORAL | Status: DC
  Administered 2015-10-16 – 2015-10-18 (×3): 800 mg via ORAL
  Filled 2015-10-15 (×3): qty 1

## 2015-10-15 MED ORDER — DEXTROSE 5 % IV SOLN
1.0000 g | Freq: Once | INTRAVENOUS | Status: DC
  Filled 2015-10-15: qty 10

## 2015-10-15 MED ORDER — OXYCODONE-ACETAMINOPHEN 5-325 MG PO TABS
1.0000 | ORAL_TABLET | Freq: Four times a day (QID) | ORAL | Status: DC | PRN
  Administered 2015-10-16 – 2015-10-18 (×4): 1 via ORAL
  Filled 2015-10-15 (×4): qty 1

## 2015-10-15 MED ORDER — ALBUTEROL SULFATE (2.5 MG/3ML) 0.083% IN NEBU: 2.5000 mg | INHALATION_SOLUTION | Freq: Four times a day (QID) | RESPIRATORY_TRACT | Status: DC | PRN

## 2015-10-15 SURGICAL SUPPLY — 23 items
BAG URO CATCHER STRL LF (MISCELLANEOUS) ×2 IMPLANT
BASKET LASER NITINOL 1.9FR (BASKET) IMPLANT
BASKET STONE 1.7 NGAGE (UROLOGICAL SUPPLIES) IMPLANT
BASKET ZERO TIP NITINOL 2.4FR (BASKET) IMPLANT
CANISTER SUCT LVC 12 LTR MEDI- (MISCELLANEOUS) IMPLANT
CATH INTERMIT  6FR 70CM (CATHETERS) IMPLANT
CLOTH BEACON ORANGE TIMEOUT ST (SAFETY) ×2 IMPLANT
FIBER LASER FLEXIVA 365 (UROLOGICAL SUPPLIES) IMPLANT
FIBER LASER TRAC TIP (UROLOGICAL SUPPLIES) IMPLANT
GLOVE BIO SURGEON STRL SZ8 (GLOVE) ×2 IMPLANT
GOWN STRL REUS W/ TWL LRG LVL3 (GOWN DISPOSABLE) ×1 IMPLANT
GOWN STRL REUS W/ TWL XL LVL3 (GOWN DISPOSABLE) ×1 IMPLANT
GOWN STRL REUS W/TWL LRG LVL3 (GOWN DISPOSABLE) ×1
GOWN STRL REUS W/TWL XL LVL3 (GOWN DISPOSABLE) ×1
GUIDEWIRE ANG ZIPWIRE 038X150 (WIRE) ×2 IMPLANT
GUIDEWIRE STR DUAL SENSOR (WIRE) IMPLANT
GUIDEWIRE STR ZIPWIRE 035X150 (MISCELLANEOUS) ×2 IMPLANT
IV NS IRRIG 3000ML ARTHROMATIC (IV SOLUTION) ×2 IMPLANT
MANIFOLD NEPTUNE II (INSTRUMENTS) IMPLANT
PACK CYSTO (CUSTOM PROCEDURE TRAY) ×2 IMPLANT
STENT URET 6FRX26 CONTOUR (STENTS) ×2 IMPLANT
SYRINGE 10CC LL (SYRINGE) ×2 IMPLANT
TUBE FEEDING 8FR 16IN STR KANG (MISCELLANEOUS) IMPLANT

## 2015-10-15 NOTE — ED Provider Notes (Addendum)
[facility] Emergency Department Provider Note   ____________________________________________  Time seen: Approximately 3:15 PM  I have reviewed the triage vital signs and the nursing notes.   HISTORY  Chief Complaint Abdominal Pain   HPI Kimberly Huff is a 70 y.o. female with a history of kidney stones as well as kidney infection who is presenting to the emergency department with sudden onset left flank pain which started at 9 AM this morning. She describes the pain as sharp and a 10 out of 10. Says it is radiating around to the left side of her groin. Said that she was feeling fine up until the onset of this pain. Says that in the past, when she has had kidney infections, and has been more of a slower onset. Denies any fever. Admits to nausea but no vomiting.   Past Medical History  Diagnosis Date  . Kidney stone   . Diverticulosis 2005    Dr Jamal Collin  . Coronary artery disease   . Hypertension   . Hyperlipidemia   . Hypopotassemia   . Atrophic kidney     right  . Osteoarthritis   . Coronary atherosclerosis   . Obesity   . Myocardial infarction ([place]) 2000  . Recurrent UTI   . Steatohepatitis 05/30/10    Brunt Stage 3  . Allergy   . Blood transfusion   . Cataract   . Neuromuscular disorder ([place])     "achy muscles"  . External hemorrhoids without mention of complication 0109  . Unspecified essential hypertension 2014  . Heart murmur 2012    found by Dr. Jamal Collin  . H/O cardiac catheterization 2005  . Spinal stenosis of cervical region   . Blood transfusion without reported diagnosis   . Clotting disorder (HCC)     blood clots in abdomen after surgery  . GERD (gastroesophageal reflux disease)   . Ulcer   . Type II or unspecified type diabetes mellitus without mention of complication, uncontrolled     Patient does takes Metformin and recommended to stop for 48 hours.  . Diabetes mellitus     Patient Active Problem List   Diagnosis Date  Noted  . Dysuria 08/26/2015  . Urinary retention 06/29/2015  . Diabetes mellitus without complication ([place]) 32/35/5732  . Acute bronchitis 06/27/2015  . Fracture of metatarsal of right foot, closed 06/27/2015  . Closed displaced fracture of proximal phalanx of lesser toe of right foot with malunion 06/27/2015  . Myalgia and myositis 06/09/2014  . Personal history of gastric ulcer 02/23/2013  . Left shoulder pain 02/23/2013  . Medicare annual wellness visit, subsequent 02/23/2013  . Vitamin D deficiency 02/23/2013  . Humeral shaft fracture 02/16/2012  . Gouty arthritis 08/25/2011  . Type 2 diabetes mellitus, controlled, with renal complications ([place]) 20/25/4270  . Hemorrhoid 05/27/2011  . Obesity (BMI 30-39.9) 02/16/2011  . Dyslipidemia associated with type 2 diabetes mellitus ([place]) 02/16/2011  . Cataract extraction status 02/16/2011  . Hydronephrosis, left 02/16/2011  . NASH (nonalcoholic steatohepatitis) 02/16/2011    Past Surgical History  Procedure Laterality Date  . Breast mass excision      benign  . Ureterotomy  1960/1969    x 2 during childhood  . Abdominal hysterectomy  1982    complete  . Ptca  2000  . Vaginal hysterectomy      PT DENIES ABD HYSTERECTOMY  . Foot surgery  2008  . Cardiac stents  2000  . Tonsillectomy    . Salpingoophorectomy    .  Colonoscopy  2004, 2015    Dr. Jamal Collin, Dr. Olevia Perches  . Eye surgery Bilateral 2012    cataract  . Upper gi endoscopy    . Cataract extraction      both eyes  . Breast cyst aspiration Right 1990's    benign    Current Outpatient Rx  Name  Route  Sig  Dispense  Refill  . albuterol (PROVENTIL HFA;VENTOLIN HFA) 108 (90 Base) MCG/ACT inhaler   Inhalation   Inhale 2 puffs into the lungs every 6 (six) hours as needed for wheezing or shortness of breath. USE WITH SPACER   1 Inhaler   11     PLEASE PROVIDE SPACER TO USE WITH MDI   . aspirin 325 MG tablet   Oral   Take by mouth.         . benzonatate (TESSALON) 200  MG capsule   Oral   Take 1 capsule (200 mg total) by mouth 3 (three) times daily as needed for cough.   60 capsule   1   . furosemide (LASIX) 40 MG tablet      TAKE ONE TABLET EVERY DAY   30 tablet   3     FOR NEXT FILL. THANK YOU   . ibuprofen (ADVIL,MOTRIN) 800 MG tablet      TAKE ONE TABLET 3 TIMES DAILY   90 tablet   1   . losartan (COZAAR) 50 MG tablet      TAKE 1 TABLET EVERY DAY   90 tablet   1   . metFORMIN (GLUCOPHAGE) 850 MG tablet      TAKE ONE TABLET BY MOUTH TWICE DAILY WITH A MEAL   180 tablet   1     FOR NEXT FILL. THANK YOU   . nitrofurantoin, macrocrystal-monohydrate, (MACROBID) 100 MG capsule   Oral   Take 1 capsule (100 mg total) by mouth 2 (two) times daily.   10 capsule   0   . omeprazole (PRILOSEC) 40 MG capsule      TAKE 1 CAPSULE (40 MG TOTAL) BY MOUTH DAILY.   30 capsule   11   . potassium chloride (K-DUR) 10 MEQ tablet      TAKE ONE TABLET BY MOUTH EVERY DAY   90 tablet   1     FOR NEXT FILL. THANK YOU   . simvastatin (ZOCOR) 40 MG tablet      TAKE ONE TABLET BY MOUTH EVERY DAY AT 6PM   30 tablet   1     FOR NEXT FILL. [place] 6 YOU     Allergies Review of patient's allergies indicates no known allergies.  Family History  Problem Relation Age of Onset  . Autoimmune disease Daughter   . Thyroid disease Daughter   . Thyroid disease Sister   . Lung cancer Father   . Heart disease Father   . Diverticulosis Mother   . Diabetes Mother   . Stroke Mother 61  . Stomach cancer Maternal Aunt   . Breast cancer Maternal Aunt   . Colon cancer Neg Hx   . Esophageal cancer Neg Hx   . Rectal cancer Neg Hx     Social History Social History  Substance Use Topics  . Smoking status: Former Smoker    Types: Cigarettes    Quit date: 09/15/1998  . Smokeless tobacco: Never Used  . Alcohol Use: Yes     Comment: very rare    Review of Systems Constitutional: No fever/chills Eyes: No visual  changes. ENT: No sore  throat. Cardiovascular: Denies chest pain. Respiratory: Denies shortness of breath. Gastrointestinal:  no vomiting.  No diarrhea.  No constipation. Genitourinary: Negative for dysuria. Musculoskeletal: Negative for back pain. Skin: Negative for rash. Neurological: Negative for headaches, focal weakness or numbness.  10-point ROS otherwise negative.  ____________________________________________   PHYSICAL EXAM:  VITAL SIGNS: ED Triage Vitals  Enc Vitals Group     BP 10/15/15 1425 135/74 mmHg     Pulse Rate 10/15/15 1425 81     Resp 10/15/15 1425 18     Temp 10/15/15 1425 97.6 F (36.4 C)     Temp src --      SpO2 10/15/15 1425 94 %     Weight 10/15/15 1425 185 lb (83.915 kg)     Height 10/15/15 1425 5' 3"  (1.6 m)     Head Cir --      Peak Flow --      Pain Score 10/15/15 1425 7     Pain Loc --      Pain Edu? --      Excl. in [place]? --     Constitutional: Alert and oriented. Sitting upright on the side of the bed. Appears uncomfortable. Eyes: Conjunctivae are normal. PERRL. EOMI. Head: Atraumatic. Nose: No congestion/rhinnorhea. Mouth/Throat: Mucous membranes are moist.   Neck: No stridor.   Cardiovascular: Normal rate, regular rhythm. Grossly normal heart sounds.   Respiratory: Normal respiratory effort.  No retractions. Lungs CTAB. Gastrointestinal: Soft With mild left lower quadrant tenderness to palpation. No distention. Mild left CVA tenderness. Musculoskeletal: No lower extremity tenderness nor edema.  No joint effusions. Neurologic:  Normal speech and language. No gross focal neurologic deficits are appreciated.  Skin:  Skin is warm, dry and intact. No rash noted. Psychiatric: Mood and affect are normal. Speech and behavior are normal.  ____________________________________________   LABS (all labs ordered are listed, but only abnormal results are displayed)  Labs Reviewed  COMPREHENSIVE METABOLIC PANEL - Abnormal; Notable for the following:    Sodium 133 (*)     Chloride 97 (*)    Glucose, Bld 147 (*)    BUN 21 (*)    All other components within normal limits  CBC - Abnormal; Notable for the following:    WBC 14.5 (*)    All other components within normal limits  URINALYSIS COMPLETEWITH MICROSCOPIC ([facility] ONLY) - Abnormal; Notable for the following:    Color, Urine YELLOW (*)    APPearance HAZY (*)    Hgb urine dipstick 3+ (*)    Leukocytes, UA 3+ (*)    Bacteria, UA RARE (*)    Squamous Epithelial / LPF 0-5 (*)    All other components within normal limits  LIPASE, BLOOD   ____________________________________________  EKG   ____________________________________________  RADIOLOGY  CT RENAL STONE STUDY (Final result) Result time: 10/15/15 15:53:56   Final result by Rad Results In Interface (10/15/15 15:53:56)   Narrative:   CLINICAL DATA: Left flank pain  EXAM: CT ABDOMEN AND PELVIS WITHOUT CONTRAST  TECHNIQUE: Multidetector CT imaging of the abdomen and pelvis was performed following the standard protocol without IV contrast.  COMPARISON: None.  FINDINGS: Lower chest: Lung bases clear without infiltrate or effusion. Mild coronary calcification.  Hepatobiliary: Fatty infiltration of the liver without focal liver lesion. Liver size normal. Gallbladder and bile ducts normal.  Pancreas: Negative  Spleen: Normal splenic size. 15 mm splenule.  Adrenals/Urinary Tract: Marked atrophy of the left kidney. Left renal hydronephrosis due  to a stone in the proximal left ureter. Stone measures 6 x 11 mm. Multiple small left renal calculi. No renal obstruction or stone on the right. 2.5 cm right renal lesion with water density compatible with cyst. Normal bladder.  Stomach/Bowel: Normal stomach. Negative for bowel obstruction. No bowel edema or mass. Sigmoid diverticulosis. Negative for diverticulitis. Normal appendix.  Vascular/Lymphatic: Atherosclerotic aorta without aneurysm. No lymphadenopathy.  Reproductive:  Hysterectomy. No pelvic mass.  Other: No free fluid. Periumbilical hernia containing abdominal fat. Small hernia.  Musculoskeletal: Moderate disc and facet degeneration throughout the lumbar spine with grade 1 anterior slip L3-4 and L4-5. Negative for fracture.  IMPRESSION: Left renal obstruction by a large stone at the UPJ measuring 6 x 11 mm. Marked left renal atrophy with multiple small left renal calculi.  2.5 cm right renal lesion most likely a cyst.  Fatty liver  Normal appendix  Sigmoid diverticulosis without diverticulitis.   Electronically Signed By: Franchot Gallo M.D. On: 10/15/2015 15:53       ____________________________________________   PROCEDURES   ____________________________________________   INITIAL IMPRESSION / ASSESSMENT AND PLAN / ED COURSE  Pertinent labs & imaging results that were available during my care of the patient were reviewed by me and considered in my medical decision making (see chart for details).  ----------------------------------------- 4:23 PM on 10/15/2015 -----------------------------------------  Patient found to have left proximal obstructing ureteral stone with infected urine and an elevated white blood cell count. Discussed case with Dr. Noah Delaine of urology who will be evaluating the patient the bedside. After morphine the patient is lying in the bed and appears much more comfortable and states her pain is now 7 out of 10.  ----------------------------------------- 4:49 PM on 10/15/2015 -----------------------------------------  Dr. Aline August is in the room right now and is planning on taking the patient to surgery at 9 PM. The patient knows that she is not to eat or drink anything. Her pain is back up to a 10 out of 10 and she will be given 1 mg of Dilaudid. ____________________________________________   FINAL CLINICAL IMPRESSION(S) / ED DIAGNOSES  Final diagnoses:  Left flank pain  UTI. Ureteral  stone.    NEW MEDICATIONS STARTED DURING THIS VISIT:  New Prescriptions   No medications on file     Note:  This document was prepared using Dragon voice recognition software and may include unintentional dictation errors.    Kimberly Pyo, MD 10/15/15 1649  Because of the patient's comorbidities, Dr. Aline August is requesting that medicine admit the patient. Signed out to Dr. Boyce Medici who accepts.  Kimberly Pyo, MD 10/15/15 [phone]

## 2015-10-15 NOTE — Anesthesia Preprocedure Evaluation (Addendum)
Anesthesia Evaluation  Patient identified by MRN, date of birth, ID band  Reviewed: Allergy & Precautions, NPO status   Airway Mallampati: III  TM Distance: <3 FB Neck ROM: Limited    Dental  (+) Chipped, Caps   Pulmonary former smoker,    Pulmonary exam normal        Cardiovascular hypertension, + CAD and + Past MI  Normal cardiovascular exam+ Valvular Problems/Murmurs      Neuro/Psych    GI/Hepatic GERD  Medicated and Controlled,(+) Hepatitis -, Unspecified  Endo/Other  diabetes  Renal/GU Renal diseaseKidney stone and atrophic kidney     Musculoskeletal  (+) Arthritis , Osteoarthritis,  Myalgias and hx of myositis   Abdominal Normal abdominal exam  (+)   Peds  Hematology negative hematology ROS (+)   Anesthesia Other Findings   Reproductive/Obstetrics                            Anesthesia Physical Anesthesia Plan  ASA: III and emergent  Anesthesia Plan: General   Post-op Pain Management:    Induction: Intravenous, Rapid sequence and Cricoid pressure planned  Airway Management Planned: Oral ETT  Additional Equipment:   Intra-op Plan:   Post-operative Plan: Extubation in OR  Informed Consent: I have reviewed the patients History and Physical, chart, labs and discussed the procedure including the risks, benefits and alternatives for the proposed anesthesia with the patient or authorized representative who has indicated his/her understanding and acceptance.   Dental advisory given  Plan Discussed with: CRNA and Surgeon  Anesthesia Plan Comments:         Anesthesia Quick Evaluation

## 2015-10-15 NOTE — H&P (Signed)
[facility] NAME: Kimberly Huff    MR#:  [id number]  DATE OF BIRTH:  Oct 31, 1945  DATE OF ADMISSION:  10/15/2015  PRIMARY CARE PHYSICIAN: [name], MD   REQUESTING/REFERRING PHYSICIAN: Schaevitz  CHIEF COMPLAINT:   Chief Complaint  Patient presents with  . Abdominal Pain    HISTORY OF PRESENT ILLNESS: Kimberly Huff  is a 70 y.o. female with a known history of Kidney stone, diverticulosis, coronary artery disease, hypertension, hyperlipidemia, atrophic kidney on the left, obesity, recurrent UTI- started having left-sided abdominal pain and flank pain since today morning, she called her primary care doctor's office but there about 2) office so suggested to go to walk-in, not a clinic where she go and they told her to go to emergency room right away as she has all this history of renal stone in atrophic kidney. She was found to have large left ureteral stone on CT scan of the abdomen and urinalysis was positive. Patient denied any fever or chills. ER physician spoke to urologist on call he is going to take her for surgery tonight for the renal stone but patient has multiple medical issues so he suggested to admit to medical service and he will be as a consult on to the surgery.  PAST MEDICAL HISTORY:   Past Medical History  Diagnosis Date  . Kidney stone   . Diverticulosis 2005    Dr Jamal Collin  . Coronary artery disease   . Hypertension   . Hyperlipidemia   . Hypopotassemia   . Atrophic kidney     right  . Osteoarthritis   . Coronary atherosclerosis   . Obesity   . Myocardial infarction ([place]) 2000  . Recurrent UTI   . Steatohepatitis 05/30/10    Brunt Stage 3  . Allergy   . Blood transfusion   . Cataract   . Neuromuscular disorder ([place])     "achy muscles"  . External hemorrhoids without mention of complication 5784  . Unspecified essential hypertension 2014  . Heart murmur 2012    found by Dr. Jamal Collin  . H/O cardiac  catheterization 2005  . Spinal stenosis of cervical region   . Blood transfusion without reported diagnosis   . Clotting disorder (HCC)     blood clots in abdomen after surgery  . GERD (gastroesophageal reflux disease)   . Ulcer   . Type II or unspecified type diabetes mellitus without mention of complication, uncontrolled     Patient does takes Metformin and recommended to stop for 48 hours.  . Diabetes mellitus     PAST SURGICAL HISTORY: Past Surgical History  Procedure Laterality Date  . Breast mass excision      benign  . Ureterotomy  1960/1969    x 2 during childhood  . Abdominal hysterectomy  1982    complete  . Ptca  2000  . Vaginal hysterectomy      PT DENIES ABD HYSTERECTOMY  . Foot surgery  2008  . Cardiac stents  2000  . Tonsillectomy    . Salpingoophorectomy    . Colonoscopy  2004, 2015    Dr. Jamal Collin, Dr. Olevia Perches  . Eye surgery Bilateral 2012    cataract  . Upper gi endoscopy    . Cataract extraction      both eyes  . Breast cyst aspiration Right 1990's    benign    SOCIAL HISTORY:  Social History  Substance Use Topics  . Smoking status: Former  Smoker    Types: Cigarettes    Quit date: 09/15/1998  . Smokeless tobacco: Never Used  . Alcohol Use: Yes     Comment: very rare    FAMILY HISTORY:  Family History  Problem Relation Age of Onset  . Autoimmune disease Daughter   . Thyroid disease Daughter   . Thyroid disease Sister   . Lung cancer Father   . Heart disease Father   . Diverticulosis Mother   . Diabetes Mother   . Stroke Mother 53  . Stomach cancer Maternal Aunt   . Breast cancer Maternal Aunt   . Colon cancer Neg Hx   . Esophageal cancer Neg Hx   . Rectal cancer Neg Hx     DRUG ALLERGIES: No Known Allergies  REVIEW OF SYSTEMS:   CONSTITUTIONAL: No fever, fatigue or weakness.  EYES: No blurred or double vision.  EARS, NOSE, AND THROAT: No tinnitus or ear pain.  RESPIRATORY: No cough, shortness of breath, wheezing or hemoptysis.   CARDIOVASCULAR: No chest pain, orthopnea, edema.  GASTROINTESTINAL: No nausea, vomiting, diarrhea , Positive for abdominal pain.  GENITOURINARY: No dysuria, hematuria.  ENDOCRINE: No polyuria, nocturia,  HEMATOLOGY: No anemia, easy bruising or bleeding SKIN: No rash or lesion. MUSCULOSKELETAL: No joint pain or arthritis.   NEUROLOGIC: No tingling, numbness, weakness.  PSYCHIATRY: No anxiety or depression.   MEDICATIONS AT HOME:  Prior to Admission medications   Medication Sig Start Date End Date Taking? Authorizing Provider  albuterol (PROVENTIL HFA;VENTOLIN HFA) 108 (90 Base) MCG/ACT inhaler Inhale 2 puffs into the lungs every 6 (six) hours as needed for wheezing or shortness of breath.   Yes Historical Provider, MD  aspirin EC 325 MG tablet Take 325 mg by mouth daily.   Yes Historical Provider, MD  furosemide (LASIX) 40 MG tablet Take 40 mg by mouth daily.   Yes Historical Provider, MD  ibuprofen (ADVIL,MOTRIN) 800 MG tablet Take 800 mg by mouth daily.   Yes Historical Provider, MD  losartan (COZAAR) 50 MG tablet Take 50 mg by mouth daily.   Yes Historical Provider, MD  metFORMIN (GLUCOPHAGE) 850 MG tablet Take 850 mg by mouth 2 (two) times daily with a meal.   Yes Historical Provider, MD  omeprazole (PRILOSEC) 40 MG capsule Take 40 mg by mouth daily.   Yes Historical Provider, MD  potassium chloride (K-DUR,KLOR-CON) 10 MEQ tablet Take 10 mEq by mouth daily.   Yes Historical Provider, MD  simvastatin (ZOCOR) 40 MG tablet Take 40 mg by mouth every evening.   Yes Historical Provider, MD  nitrofurantoin, macrocrystal-monohydrate, (MACROBID) 100 MG capsule Take 1 capsule (100 mg total) by mouth 2 (two) times daily. Patient not taking: Reported on 10/15/2015 08/26/15   Rubbie Battiest, NP      PHYSICAL EXAMINATION:   VITAL SIGNS: Blood pressure 135/74, pulse 81, temperature 97.6 F (36.4 C), resp. rate 18, height 5' 3"  (1.6 m), weight 83.915 kg (185 lb), SpO2 94 %.  GENERAL:  70  y.o.-year-old patient lying in the bed with no acute distress.  EYES: Pupils equal, round, reactive to light and accommodation. No scleral icterus. Extraocular muscles intact.  HEENT: Head atraumatic, normocephalic. Oropharynx and nasopharynx clear.  NECK:  Supple, no jugular venous distention. No thyroid enlargement, no tenderness.  LUNGS: Normal breath sounds bilaterally, no wheezing, rales,rhonchi or crepitation. No use of accessory muscles of respiration.  CARDIOVASCULAR: S1, S2 normal. Positive for systolic murmurs,no rubs, or gallops.  ABDOMEN: Soft, left side CVA tender, nondistended. Bowel  sounds present. No organomegaly or mass.  EXTREMITIES: No pedal edema, cyanosis, or clubbing.  NEUROLOGIC: Cranial nerves II through XII are intact. Muscle strength 5/5 in all extremities. Sensation intact. Gait not checked.  PSYCHIATRIC: The patient is alert and oriented x 3.  SKIN: No obvious rash, lesion, or ulcer.   LABORATORY PANEL:   CBC  Recent Labs Lab 10/15/15 1428  WBC 14.5*  HGB 14.1  HCT 42.4  PLT 151  MCV 82.6  MCH 27.4  MCHC 33.1  RDW 13.2   ------------------------------------------------------------------------------------------------------------------  Chemistries   Recent Labs Lab 10/15/15 1428  NA 133*  K 4.2  CL 97*  CO2 23  GLUCOSE 147*  BUN 21*  CREATININE 0.73  CALCIUM 10.0  AST 36  ALT 50  ALKPHOS 73  BILITOT 0.6   ------------------------------------------------------------------------------------------------------------------ estimated creatinine clearance is 65.2 mL/min (by C-G formula based on Cr of 0.73). ------------------------------------------------------------------------------------------------------------------ No results for input(s): TSH, T4TOTAL, T3FREE, THYROIDAB in the last 72 hours.  Invalid input(s): FREET3   Coagulation profile No results for input(s): INR, PROTIME in the last 168  hours. ------------------------------------------------------------------------------------------------------------------- No results for input(s): DDIMER in the last 72 hours. -------------------------------------------------------------------------------------------------------------------  Cardiac Enzymes No results for input(s): CKMB, TROPONINI, MYOGLOBIN in the last 168 hours.  Invalid input(s): CK ------------------------------------------------------------------------------------------------------------------ Invalid input(s): POCBNP  ---------------------------------------------------------------------------------------------------------------  Urinalysis    Component Value Date/Time   COLORURINE YELLOW* 10/15/2015 1444   APPEARANCEUR HAZY* 10/15/2015 1444   LABSPEC 1.016 10/15/2015 1444   PHURINE 5.0 10/15/2015 1444   GLUCOSEU NEGATIVE 10/15/2015 1444   GLUCOSEU NEGATIVE 08/26/2015 1131   HGBUR 3+* 10/15/2015 1444   BILIRUBINUR NEGATIVE 10/15/2015 1444   BILIRUBINUR negative 08/26/2015 1130   BILIRUBINUR 0.2 06/27/2015 0828   KETONESUR NEGATIVE 10/15/2015 1444   KETONESUR negative 08/26/2015 1130   PROTEINUR NEGATIVE 10/15/2015 1444   PROTEINUR negative 08/26/2015 1130   PROTEINUR neg 06/27/2015 0828   UROBILINOGEN 0.2 08/26/2015 1131   UROBILINOGEN 0.2 08/26/2015 1130   NITRITE NEGATIVE 10/15/2015 1444   NITRITE Negative 08/26/2015 1130   NITRITE neg 06/27/2015 0828   LEUKOCYTESUR 3+* 10/15/2015 1444     RADIOLOGY: Ct Renal Stone Study  10/15/2015  CLINICAL DATA:  Left flank pain EXAM: CT ABDOMEN AND PELVIS WITHOUT CONTRAST TECHNIQUE: Multidetector CT imaging of the abdomen and pelvis was performed following the standard protocol without IV contrast. COMPARISON:  None. FINDINGS: Lower chest: Lung bases clear without infiltrate or effusion. Mild coronary calcification. Hepatobiliary: Fatty infiltration of the liver without focal liver lesion. Liver size normal.  Gallbladder and bile ducts normal. Pancreas: Negative Spleen: Normal splenic size.  15 mm splenule. Adrenals/Urinary Tract: Marked atrophy of the left kidney. Left renal hydronephrosis due to a stone in the proximal left ureter. Stone measures 6 x 11 mm. Multiple small left renal calculi. No renal obstruction or stone on the right. 2.5 cm right renal lesion with water density compatible with cyst. Normal bladder. Stomach/Bowel: Normal stomach. Negative for bowel obstruction. No bowel edema or mass. Sigmoid diverticulosis. Negative for diverticulitis. Normal appendix. Vascular/Lymphatic: Atherosclerotic aorta without aneurysm. No lymphadenopathy. Reproductive: Hysterectomy.  No pelvic mass. Other: No free fluid. Periumbilical hernia containing abdominal fat. Small hernia. Musculoskeletal: Moderate disc and facet degeneration throughout the lumbar spine with grade 1 anterior slip L3-4 and L4-5. Negative for fracture. IMPRESSION: Left renal obstruction by a large stone at the UPJ measuring 6 x 11 mm. Marked left renal atrophy with multiple small left renal calculi. 2.5 cm right renal lesion most likely a cyst. Fatty liver  Normal appendix Sigmoid diverticulosis without diverticulitis. Electronically Signed   By: Franchot Gallo M.D.   On: 10/15/2015 15:53    EKG: No orders found for this or any previous visit.  IMPRESSION AND PLAN:  * Renal stone and UTI   We'll give IV ceftriaxone and get a urine culture.   Urologist to do surgery tonight.  * Diabetes   As patient will be maintained nothing by mouth for surgery tonight. I will hold her metformin and give her insulin sliding scale coverage.  * Coronary artery disease   Her last MI was in 2000, she follows regularly with Dr. Clayborn Bigness every year and up until last year the stress test was without any problem, she never feels short of breath or chest pain while doing her day to day work or shopping and walking from parking lot to the [employer] store and doing  her shopping and coming back. She does not do any extra exercise daily. Take several cardiac medications on time.  Because of very good functional status if he does not need any further workup and we can proceed to her urological surgery.  * Hypertension   Continue home medications currently blood pressure is stable.  * Gastroesophageal reflux disease   Continue Protonix.   All the records are reviewed and case discussed with ED provider. Management plans discussed with the patient, family and they are in agreement.  CODE STATUS: full code  Code Status History    This patient does not have a recorded code status. Please follow your organizational policy for patients in this situation.       TOTAL TIME TAKING CARE OF THIS PATIENT:50 min.    Vaughan Basta M.D on 10/15/2015   Between 7am to 6pm - Pager - [phone]  After 6pm go to [email or web address] - password [email or web address] [facility]  Office  [phone]  CC: Primary care physician; [name], MD   Note: This dictation was prepared with Dragon dictation along with smaller phrase technology. Any transcriptional errors that result from this process are unintentional.

## 2015-10-15 NOTE — ED Notes (Signed)
C/O LLQ abdominal pain.  Onset of symptoms this morning at work after urinating... Last BM 10/15/15.

## 2015-10-15 NOTE — Op Note (Signed)
.  Preoperative diagnosis: Left ureteral stone, concern for sepsis  Postoperative diagnosis: Same  Procedure: 1 cystoscopy 2. Left retrograde pyelography 3.  Intraoperative fluoroscopy, under one hour, with interpretation 4. Left 6 x 26 JJ stent placement  Attending: Nicolette Bang  Anesthesia: General  Estimated blood loss: None  Drains: Left 6 x 26 JJ ureteral stent without tether, 16 French foley catheter  Specimens: none  Antibiotics: Rocephin   Findings: left proximal ureteral stone. Moderate hydronephrosis. No masses/lesions in the bladder. Ureteral orifices in normal anatomic location.  Indications: Patient is a 70 year old female with a history of left ureteral stone and concern for sepsis.  After discussing treatment options, they decided proceed with left stent placement.  Procedure her in detail: The patient was brought to the operating room and a brief timeout was done to ensure correct patient, correct procedure, correct site.  General anesthesia was administered patient was placed in dorsal lithotomy position.  Their genitalia was then prepped and draped in usual sterile fashion.  A rigid 67 French cystoscope was passed in the urethra and the bladder.  Bladder was inspected free masses or lesions.  the ureteral orifices were in the normal orthotopic locations.  a 6 french ureteral catheter was then instilled into the left ureteral orifice.  a gentle retrograde was obtained and findings noted above.  we then placed a zip wire through the ureteral catheter and advanced up to the renal pelvis.    We then placed a 6 x 26 double-j ureteral stent over the original zip wire.  We then removed the wire and good coil was noted in the the renal pelvis under fluoroscopy and the bladder under direct vision.  A foley catheter was then placed. the bladder was then drained and this concluded the procedure which was well tolerated by patient.  Complications: None  Condition: Stable,  extubated, transferred to PACU  Plan: Patient is to be admitted for IV antibiotics. She will have his stone extraction in 2 weeks.

## 2015-10-15 NOTE — Consult Note (Signed)
Urology Consult  Referring physician: Dr. Clearnce Hasten Reason for referral: left ureteral calculus  Chief Complaint: left flank pain  History of Present Illness: Kimberly Huff is a 70yo with a hx of DMII, CAD who presented to [facility] ER with a 1 day hx of severe, sharp, intermittent, nonradiating left flank pain. THis is her first stone event. She has associated nausea and vomiting. She denies any LUTS. No fevers at home.  CT scan shows a 1.2cm Left UPJ calculus with moderate hydronephrosis in an atrophic kidney. WBC count is 14.5. UA shows WBCs and blood.   Past Medical History  Diagnosis Date  . Kidney stone   . Diverticulosis 2005    Dr Jamal Collin  . Coronary artery disease   . Hypertension   . Hyperlipidemia   . Hypopotassemia   . Atrophic kidney     right  . Osteoarthritis   . Coronary atherosclerosis   . Obesity   . Myocardial infarction ([place]) 2000  . Recurrent UTI   . Steatohepatitis 05/30/10    Brunt Stage 3  . Allergy   . Blood transfusion   . Cataract   . Neuromuscular disorder ([place])     "achy muscles"  . External hemorrhoids without mention of complication 2725  . Unspecified essential hypertension 2014  . Heart murmur 2012    found by Dr. Jamal Collin  . H/O cardiac catheterization 2005  . Spinal stenosis of cervical region   . Blood transfusion without reported diagnosis   . Clotting disorder (HCC)     blood clots in abdomen after surgery  . GERD (gastroesophageal reflux disease)   . Ulcer   . Type II or unspecified type diabetes mellitus without mention of complication, uncontrolled     Patient does takes Metformin and recommended to stop for 48 hours.  . Diabetes mellitus    Past Surgical History  Procedure Laterality Date  . Breast mass excision      benign  . Ureterotomy  1960/1969    x 2 during childhood  . Abdominal hysterectomy  1982    complete  . Ptca  2000  . Vaginal hysterectomy      PT DENIES ABD HYSTERECTOMY  . Foot surgery  2008  . Cardiac stents  2000   . Tonsillectomy    . Salpingoophorectomy    . Colonoscopy  2004, 2015    Dr. Jamal Collin, Dr. Olevia Perches  . Eye surgery Bilateral 2012    cataract  . Upper gi endoscopy    . Cataract extraction      both eyes  . Breast cyst aspiration Right 1990's    benign    Medications: I have reviewed the patient's current medications. Allergies: No Known Allergies  Family History  Problem Relation Age of Onset  . Autoimmune disease Daughter   . Thyroid disease Daughter   . Thyroid disease Sister   . Lung cancer Father   . Heart disease Father   . Diverticulosis Mother   . Diabetes Mother   . Stroke Mother 56  . Stomach cancer Maternal Aunt   . Breast cancer Maternal Aunt   . Colon cancer Neg Hx   . Esophageal cancer Neg Hx   . Rectal cancer Neg Hx    Social History:  reports that she quit smoking about 17 years ago. Her smoking use included Cigarettes. She has never used smokeless tobacco. She reports that she drinks alcohol. She reports that she does not use illicit drugs.  Review of Systems  Constitutional: Positive  for chills and malaise/fatigue.  Gastrointestinal: Positive for nausea and vomiting.  Genitourinary: Positive for flank pain.  All other systems reviewed and are negative.   Physical Exam:  Vital signs in last 24 hours: Temp:  [97.6 F (36.4 C)-100.3 F (37.9 C)] 100.3 F (37.9 C) (06/13 1849) Pulse Rate:  [76-81] 77 (06/13 1830) Resp:  [17-18] 18 (06/13 1830) BP: (129-142)/(61-74) 129/61 mmHg (06/13 1830) SpO2:  [94 %-96 %] 96 % (06/13 1830) Weight:  [83.915 kg (185 lb)] 83.915 kg (185 lb) (06/13 1425) Physical Exam  Constitutional: She is oriented to person, place, and time. She appears well-developed and well-nourished.  HENT:  Head: Normocephalic and atraumatic.  Eyes: EOM are normal. Pupils are equal, round, and reactive to light.  Neck: Normal range of motion. No thyromegaly present.  Cardiovascular: Normal rate and regular rhythm.   Respiratory: Effort  normal. No respiratory distress.  GI: Soft. She exhibits no distension.  Musculoskeletal: Normal range of motion.  Neurological: She is alert and oriented to person, place, and time.  Skin: Skin is warm and dry.  Psychiatric: She has a normal mood and affect. Her behavior is normal. Judgment and thought content normal.    Laboratory Data:  Results for orders placed or performed during the hospital encounter of 10/15/15 (from the past 72 hour(s))  Lipase, blood     Status: None   Collection Time: 10/15/15  2:28 PM  Result Value Ref Range   Lipase 33 11 - 51 U/L  Comprehensive metabolic panel     Status: Abnormal   Collection Time: 10/15/15  2:28 PM  Result Value Ref Range   Sodium 133 (L) 135 - 145 mmol/L   Potassium 4.2 3.5 - 5.1 mmol/L   Chloride 97 (L) 101 - 111 mmol/L   CO2 23 22 - 32 mmol/L   Glucose, Bld 147 (H) 65 - 99 mg/dL   BUN 21 (H) 6 - 20 mg/dL   Creatinine, Ser 0.73 0.44 - 1.00 mg/dL   Calcium 10.0 8.9 - 10.3 mg/dL   Total Protein 7.7 6.5 - 8.1 g/dL   Albumin 4.9 3.5 - 5.0 g/dL   AST 36 15 - 41 U/L   ALT 50 14 - 54 U/L   Alkaline Phosphatase 73 38 - 126 U/L   Total Bilirubin 0.6 0.3 - 1.2 mg/dL   GFR calc non Af Amer >60 >60 mL/min   GFR calc Af Amer >60 >60 mL/min    Comment: (NOTE) The eGFR has been calculated using the CKD EPI equation. This calculation has not been validated in all clinical situations. eGFR's persistently <60 mL/min signify possible Chronic Kidney Disease.    Anion gap 13 5 - 15  CBC     Status: Abnormal   Collection Time: 10/15/15  2:28 PM  Result Value Ref Range   WBC 14.5 (H) 3.6 - 11.0 K/uL   RBC 5.14 3.80 - 5.20 MIL/uL   Hemoglobin 14.1 12.0 - 16.0 g/dL   HCT 42.4 35.0 - 47.0 %   MCV 82.6 80.0 - 100.0 fL   MCH 27.4 26.0 - 34.0 pg   MCHC 33.1 32.0 - 36.0 g/dL   RDW 13.2 11.5 - 14.5 %   Platelets 151 150 - 440 K/uL  Urinalysis complete, with microscopic     Status: Abnormal   Collection Time: 10/15/15  2:44 PM  Result Value  Ref Range   Color, Urine YELLOW (A) YELLOW   APPearance HAZY (A) CLEAR   Glucose, UA NEGATIVE NEGATIVE  mg/dL   Bilirubin Urine NEGATIVE NEGATIVE   Ketones, ur NEGATIVE NEGATIVE mg/dL   Specific Gravity, Urine 1.016 1.005 - 1.030   Hgb urine dipstick 3+ (A) NEGATIVE   pH 5.0 5.0 - 8.0   Protein, ur NEGATIVE NEGATIVE mg/dL   Nitrite NEGATIVE NEGATIVE   Leukocytes, UA 3+ (A) NEGATIVE   RBC / HPF TOO NUMEROUS TO COUNT 0 - 5 RBC/hpf   WBC, UA TOO NUMEROUS TO COUNT 0 - 5 WBC/hpf   Bacteria, UA RARE (A) NONE SEEN   Squamous Epithelial / LPF 0-5 (A) NONE SEEN   Mucous PRESENT    No results found for this or any previous visit (from the past 240 hour(s)). Creatinine:  Recent Labs  10/15/15 1428  CREATININE 0.73   Baseline Creatinine: 0.7  Impression/Assessment:  70yo with left UPJ calculus and concern for sepsis  Plan:  1. The risks/benefits/alternatives to Left ureteral stent placement was explained to the patient and she understands and wishes to proceed with surgery. 2. Patient will be admitted after surgery for IV antibiotics.  Nicolette Bang 10/15/2015, 7:31 PM

## 2015-10-15 NOTE — Transfer of Care (Signed)
Immediate Anesthesia Transfer of Care Note  Patient: Kimberly Huff  Procedure(s) Performed: Procedure(s): CYSTOSCOPY WITH URETEROSCOPY AND STENT PLACEMENT (Left)  Patient Location: PACU  Anesthesia Type:General  Level of Consciousness: awake, alert , oriented and patient cooperative  Airway & Oxygen Therapy: Patient Spontanous Breathing and Patient connected to nasal cannula oxygen  Post-op Assessment: Report given to RN and Post -op Vital signs reviewed and stable  Post vital signs: Reviewed and stable  Last Vitals:  Filed Vitals:   10/15/15 1849 10/15/15 2047  BP:  127/66  Pulse:  130  Temp: 37.9 C 38.6 C  Resp:  20    Last Pain:  Filed Vitals:   10/15/15 2126  PainSc: 6          Complications: No apparent anesthesia complications

## 2015-10-15 NOTE — Anesthesia Procedure Notes (Signed)
Procedure Name: Intubation Date/Time: 10/15/2015 10:03 PM Performed by: Lendon Colonel Pre-anesthesia Checklist: Patient identified, Emergency Drugs available, Suction available, Patient being monitored and Timeout performed Patient Re-evaluated:Patient Re-evaluated prior to inductionOxygen Delivery Method: Circle system utilized Preoxygenation: Pre-oxygenation with 100% oxygen Intubation Type: IV induction, Cricoid Pressure applied and Rapid sequence Laryngoscope Size: Miller and 2 Grade View: Grade II Tube type: Oral Number of attempts: 1 Airway Equipment and Method: Stylet Placement Confirmation: ETT inserted through vocal cords under direct vision,  positive ETCO2 and breath sounds checked- equal and bilateral Secured at: 20 cm Tube secured with: Tape Dental Injury: Teeth and Oropharynx as per pre-operative assessment

## 2015-10-16 ENCOUNTER — Encounter: Payer: Self-pay | Admitting: Internal Medicine

## 2015-10-16 ENCOUNTER — Encounter: Payer: Self-pay | Admitting: Urology

## 2015-10-16 DIAGNOSIS — N390 Urinary tract infection, site not specified: Secondary | ICD-10-CM

## 2015-10-16 LAB — BASIC METABOLIC PANEL
Anion gap: 11 (ref 5–15)
BUN: 25 mg/dL — ABNORMAL HIGH (ref 6–20)
CO2: 27 mmol/L (ref 22–32)
Calcium: 9.2 mg/dL (ref 8.9–10.3)
Chloride: 98 mmol/L — ABNORMAL LOW (ref 101–111)
Creatinine, Ser: 1.01 mg/dL — ABNORMAL HIGH (ref 0.44–1.00)
GFR calc Af Amer: >60 mL/min (ref >60)
GFR calc non Af Amer: 54 mL/min — ABNORMAL LOW (ref >60)
Glucose, Bld: 148 mg/dL — ABNORMAL HIGH (ref 65–99)
Potassium: 4.2 mmol/L (ref 3.5–5.1)
Sodium: 136 mmol/L (ref 135–145)

## 2015-10-16 LAB — GLUCOSE, CAPILLARY
Glucose-Capillary: 164 mg/dL — ABNORMAL HIGH (ref 65–99)
Glucose-Capillary: 143 mg/dL — ABNORMAL HIGH (ref 65–99)
Glucose-Capillary: 155 mg/dL — ABNORMAL HIGH (ref 65–99)
Glucose-Capillary: 185 mg/dL — ABNORMAL HIGH (ref 65–99)

## 2015-10-16 LAB — CBC
HCT: 40.9 % (ref 35.0–47.0)
Hemoglobin: 13.8 g/dL (ref 12.0–16.0)
MCH: 28.3 pg (ref 26.0–34.0)
MCHC: 33.7 g/dL (ref 32.0–36.0)
MCV: 84 fL (ref 80.0–100.0)
Platelets: 112 10*3/uL — ABNORMAL LOW (ref 150–440)
RBC: 4.86 MIL/uL (ref 3.80–5.20)
RDW: 13.5 % (ref 11.5–14.5)
WBC: 15.3 10*3/uL — ABNORMAL HIGH (ref 3.6–11.0)

## 2015-10-16 MED ORDER — ACETAMINOPHEN 325 MG PO TABS
650.0000 mg | ORAL_TABLET | ORAL | Status: DC | PRN
  Administered 2015-10-16 – 2015-10-17 (×2): 650 mg via ORAL
  Filled 2015-10-16 (×2): qty 2

## 2015-10-16 MED ORDER — METFORMIN HCL 850 MG PO TABS
850.0000 mg | ORAL_TABLET | Freq: Two times a day (BID) | ORAL | Status: DC
  Administered 2015-10-16 – 2015-10-18 (×4): 850 mg via ORAL
  Filled 2015-10-16 (×4): qty 1

## 2015-10-16 MED ORDER — PHENAZOPYRIDINE HCL 100 MG PO TABS
100.0000 mg | ORAL_TABLET | Freq: Three times a day (TID) | ORAL | Status: DC
  Administered 2015-10-16 – 2015-10-18 (×8): 100 mg via ORAL
  Filled 2015-10-16 (×9): qty 1

## 2015-10-16 NOTE — Progress Notes (Signed)
Urology Consult Follow Up  Subjective: Febrile to 101.2 overnight. Otherwise hemodynamically stable.  Complaining of bladder spasms. Anxious to have catheter removed.  Anti-infectives: Anti-infectives    Start     Dose/Rate Route Frequency Ordered Stop   10/15/15 2145  cefTRIAXone (ROCEPHIN) 1 g in dextrose 5 % 50 mL IVPB  Status:  Discontinued     1 g 100 mL/hr over 30 Minutes Intravenous  Once 10/15/15 2139 10/15/15 2303   10/15/15 1730  cefTRIAXone (ROCEPHIN) 1 g in dextrose 5 % 50 mL IVPB     1 g 100 mL/hr over 30 Minutes Intravenous Every 24 hours 10/15/15 1727     10/15/15 1530  cefTRIAXone (ROCEPHIN) 1 g in dextrose 5 % 50 mL IVPB     1 g 100 mL/hr over 30 Minutes Intravenous  Once 10/15/15 1525 10/15/15 1614      Current Facility-Administered Medications  Medication Dose Route Frequency Provider Last Rate Last Dose  . acetaminophen (TYLENOL) tablet 650 mg  650 mg Oral Q4H PRN Harrie Foreman, MD   650 mg at 10/16/15 0435  . albuterol (PROVENTIL) (2.5 MG/3ML) 0.083% nebulizer solution 2.5 mg  2.5 mg Nebulization Q6H PRN Vaughan Basta, MD      . aspirin EC tablet 325 mg  325 mg Oral Daily Vaughan Basta, MD   325 mg at 10/16/15 1028  . cefTRIAXone (ROCEPHIN) 1 g in dextrose 5 % 50 mL IVPB  1 g Intravenous Q24H Vaughan Basta, MD   1 g at 10/15/15 2212  . HYDROmorphone (DILAUDID) injection 1 mg  1 mg Intravenous Q2H PRN Cleon Gustin, MD   1 mg at 10/16/15 0435  . ibuprofen (ADVIL,MOTRIN) tablet 800 mg  800 mg Oral Daily Vaughan Basta, MD   800 mg at 10/16/15 1028  . insulin aspart (novoLOG) injection 0-9 Units  0-9 Units Subcutaneous TID WC Vaughan Basta, MD      . losartan (COZAAR) tablet 50 mg  50 mg Oral Daily Vaughan Basta, MD   50 mg at 10/15/15 1845  . metFORMIN (GLUCOPHAGE) tablet 850 mg  850 mg Oral BID WC Vaughan Basta, MD      . oxyCODONE-acetaminophen (PERCOCET/ROXICET) 5-325 MG per tablet 1 tablet  1 tablet  Oral Q6H PRN Vaughan Basta, MD   1 tablet at 10/16/15 0859  . pantoprazole (PROTONIX) EC tablet 40 mg  40 mg Oral Daily Vaughan Basta, MD   40 mg at 10/16/15 1028  . phenazopyridine (PYRIDIUM) tablet 100 mg  100 mg Oral TID WC Saundra Shelling, MD   100 mg at 10/16/15 1239  . simvastatin (ZOCOR) tablet 40 mg  40 mg Oral QPM Vaughan Basta, MD   40 mg at 10/15/15 1845     Objective: Vital signs in last 24 hours: Temp:  [97.6 F (36.4 C)-101.4 F (38.6 C)] 98.5 F (36.9 C) (06/14 0758) Pulse Rate:  [76-130] 78 (06/14 0758) Resp:  [17-20] 18 (06/14 0758) BP: (91-142)/(39-74) 98/54 mmHg (06/14 0758) SpO2:  [90 %-97 %] 96 % (06/14 0758) Weight:  [185 lb (83.915 kg)] 185 lb (83.915 kg) (06/13 1425)  Intake/Output from previous day: 06/13 0701 - 06/14 0700 In: 690 [P.O.:240; I.V.:450] Out: 540 [Urine:540] Intake/Output this shift:     Physical Exam  Constitutional: She is oriented to person, place, and time and well-developed, well-nourished, and in no distress.  HENT:  Head: Normocephalic and atraumatic.  Neck: Neck supple.  Cardiovascular: Normal rate.   Abdominal: Soft. Bowel sounds are normal.  Genitourinary:  Foley draining orange tinged urine, no clots  Musculoskeletal: Normal range of motion.  Neurological: She is alert and oriented to person, place, and time.  Skin: Skin is warm and dry.    Lab Results:   Recent Labs  10/15/15 1428 10/16/15 0403  WBC 14.5* 15.3*  HGB 14.1 13.8  HCT 42.4 40.9  PLT 151 112*   BMET  Recent Labs  10/15/15 1428 10/16/15 0403  NA 133* 136  K 4.2 4.2  CL 97* 98*  CO2 23 27  GLUCOSE 147* 148*  BUN 21* 25*  CREATININE 0.73 1.01*  CALCIUM 10.0 9.2   Studies/Results: Ct Renal Stone Study  10/15/2015  CLINICAL DATA:  Left flank pain EXAM: CT ABDOMEN AND PELVIS WITHOUT CONTRAST TECHNIQUE: Multidetector CT imaging of the abdomen and pelvis was performed following the standard protocol without IV contrast.  COMPARISON:  None. FINDINGS: Lower chest: Lung bases clear without infiltrate or effusion. Mild coronary calcification. Hepatobiliary: Fatty infiltration of the liver without focal liver lesion. Liver size normal. Gallbladder and bile ducts normal. Pancreas: Negative Spleen: Normal splenic size.  15 mm splenule. Adrenals/Urinary Tract: Marked atrophy of the left kidney. Left renal hydronephrosis due to a stone in the proximal left ureter. Stone measures 6 x 11 mm. Multiple small left renal calculi. No renal obstruction or stone on the right. 2.5 cm right renal lesion with water density compatible with cyst. Normal bladder. Stomach/Bowel: Normal stomach. Negative for bowel obstruction. No bowel edema or mass. Sigmoid diverticulosis. Negative for diverticulitis. Normal appendix. Vascular/Lymphatic: Atherosclerotic aorta without aneurysm. No lymphadenopathy. Reproductive: Hysterectomy.  No pelvic mass. Other: No free fluid. Periumbilical hernia containing abdominal fat. Small hernia. Musculoskeletal: Moderate disc and facet degeneration throughout the lumbar spine with grade 1 anterior slip L3-4 and L4-5. Negative for fracture. IMPRESSION: Left renal obstruction by a large stone at the UPJ measuring 6 x 11 mm. Marked left renal atrophy with multiple small left renal calculi. 2.5 cm right renal lesion most likely a cyst. Fatty liver Normal appendix Sigmoid diverticulosis without diverticulitis. Electronically Signed   By: Franchot Gallo M.D.   On: 10/15/2015 15:53    Assessment: s/p Procedure(s): CYSTOSCOPY WITH URETEROSCOPY AND STENT PLACEMENT  Plan: -ok to d/c foley -continue iv abx, adjust based on culture/ sensitivity data -f/u in 2 weeks to discuss definitive management of the stone -Recommend discharge with Flomax, oxybutynin 5 mg 3 times a day when necessary bladder spasms in addition to narcotics for pain control   LOS: 1 day    Hollice Espy 10/16/2015

## 2015-10-16 NOTE — Anesthesia Postprocedure Evaluation (Signed)
Anesthesia Post Note  Patient: Kimberly Huff  Procedure(s) Performed: Procedure(s) (LRB): CYSTOSCOPY WITH URETEROSCOPY AND STENT PLACEMENT (Left)  Patient location during evaluation: PACU Anesthesia Type: General Level of consciousness: awake and alert and oriented Pain management: pain level controlled Vital Signs Assessment: post-procedure vital signs reviewed and stable Respiratory status: spontaneous breathing Cardiovascular status: blood pressure returned to baseline Anesthetic complications: no    Last Vitals:  Filed Vitals:   10/16/15 0139 10/16/15 0409  BP: 95/55   Pulse: 102   Temp: 36.7 C 38.4 C  Resp: 18     Last Pain:  Filed Vitals:   10/16/15 0410  PainSc: 5                  Jadira Nierman

## 2015-10-16 NOTE — Progress Notes (Signed)
Pt reported pain in her vagina. Pt feels as though the foley is what is causing the pain. Dr. Estanislado Pandy notified, saw the pt. Pt then reported a previous history of bladder spasms and pyridium was ordered for the pt.

## 2015-10-16 NOTE — Progress Notes (Signed)
[facility] at [place] NAME: Kimberly Huff    MR#:  [id number]  DATE OF BIRTH:  [birth year]  SUBJECTIVE:  CHIEF COMPLAINT:   Chief Complaint  Patient presents with  . Abdominal Pain     Found to have renal stone- take for surgery and stent placed by Urology 10/15/15.   Still have fever today. BP running on low normal side.  REVIEW OF SYSTEMS:  CONSTITUTIONAL: No fever, fatigue or weakness.  EYES: No blurred or double vision.  EARS, NOSE, AND THROAT: No tinnitus or ear pain.  RESPIRATORY: No cough, shortness of breath, wheezing or hemoptysis.  CARDIOVASCULAR: No chest pain, orthopnea, edema.  GASTROINTESTINAL: No nausea, vomiting, diarrhea or abdominal pain.  GENITOURINARY: No dysuria, hematuria.  ENDOCRINE: No polyuria, nocturia,  HEMATOLOGY: No anemia, easy bruising or bleeding SKIN: No rash or lesion. MUSCULOSKELETAL: No joint pain or arthritis.   NEUROLOGIC: No tingling, numbness, weakness.  PSYCHIATRY: No anxiety or depression.   Review of Systems  Constitutional: Positive for fever.    DRUG ALLERGIES:  No Known Allergies  VITALS:  Blood pressure 98/54, pulse 72, temperature 98.2 F (36.8 C), temperature source Oral, resp. rate 18, height 5' 3"  (1.6 m), weight 83.915 kg (185 lb), SpO2 92 %.  PHYSICAL EXAMINATION:  GENERAL:  70 y.o.-year-old patient lying in the bed with no acute distress.  EYES: Pupils equal, round, reactive to light and accommodation. No scleral icterus. Extraocular muscles intact.  HEENT: Head atraumatic, normocephalic. Oropharynx and nasopharynx clear.  NECK:  Supple, no jugular venous distention. No thyroid enlargement, no tenderness.  LUNGS: Normal breath sounds bilaterally, no wheezing, rales,rhonchi or crepitation. No use of accessory muscles of respiration.  CARDIOVASCULAR: S1, S2 normal. Systolic murmurs,   ABDOMEN: Soft, nontender, nondistended. Bowel sounds present. No organomegaly or mass. Foley in  place. EXTREMITIES: No pedal edema, cyanosis, or clubbing.  NEUROLOGIC: Cranial nerves II through XII are intact. Muscle strength 5/5 in all extremities. Sensation intact. Gait not checked.  PSYCHIATRIC: The patient is alert and oriented x 3.  SKIN: No obvious rash, lesion, or ulcer.   Physical Exam LABORATORY PANEL:   CBC  Recent Labs Lab 10/16/15 0403  WBC 15.3*  HGB 13.8  HCT 40.9  PLT 112*   ------------------------------------------------------------------------------------------------------------------  Chemistries   Recent Labs Lab 10/15/15 1428 10/16/15 0403  NA 133* 136  K 4.2 4.2  CL 97* 98*  CO2 23 27  GLUCOSE 147* 148*  BUN 21* 25*  CREATININE 0.73 1.01*  CALCIUM 10.0 9.2  AST 36  --   ALT 50  --   ALKPHOS 73  --   BILITOT 0.6  --    ------------------------------------------------------------------------------------------------------------------  Cardiac Enzymes No results for input(s): TROPONINI in the last 168 hours. ------------------------------------------------------------------------------------------------------------------  RADIOLOGY:  Ct Renal Stone Study  10/15/2015  CLINICAL DATA:  Left flank pain EXAM: CT ABDOMEN AND PELVIS WITHOUT CONTRAST TECHNIQUE: Multidetector CT imaging of the abdomen and pelvis was performed following the standard protocol without IV contrast. COMPARISON:  None. FINDINGS: Lower chest: Lung bases clear without infiltrate or effusion. Mild coronary calcification. Hepatobiliary: Fatty infiltration of the liver without focal liver lesion. Liver size normal. Gallbladder and bile ducts normal. Pancreas: Negative Spleen: Normal splenic size.  15 mm splenule. Adrenals/Urinary Tract: Marked atrophy of the left kidney. Left renal hydronephrosis due to a stone in the proximal left ureter. Stone measures 6 x 11 mm. Multiple small left renal calculi. No renal obstruction or stone on the right.  2.5 cm right renal lesion with water  density compatible with cyst. Normal bladder. Stomach/Bowel: Normal stomach. Negative for bowel obstruction. No bowel edema or mass. Sigmoid diverticulosis. Negative for diverticulitis. Normal appendix. Vascular/Lymphatic: Atherosclerotic aorta without aneurysm. No lymphadenopathy. Reproductive: Hysterectomy.  No pelvic mass. Other: No free fluid. Periumbilical hernia containing abdominal fat. Small hernia. Musculoskeletal: Moderate disc and facet degeneration throughout the lumbar spine with grade 1 anterior slip L3-4 and L4-5. Negative for fracture. IMPRESSION: Left renal obstruction by a large stone at the UPJ measuring 6 x 11 mm. Marked left renal atrophy with multiple small left renal calculi. 2.5 cm right renal lesion most likely a cyst. Fatty liver Normal appendix Sigmoid diverticulosis without diverticulitis. Electronically Signed   By: Franchot Gallo M.D.   On: 10/15/2015 15:53    ASSESSMENT AND PLAN:   Principal Problem:   UTI (lower urinary tract infection) Active Problems:   Renal stone   * Renal stone and UTI   give IV ceftriaxone and follow urine culture.  Urologist put ureteral stent on 10/15/15   After final cx report- may d/c on oral Abx and follow with urology clinic in 2 weeks.  * Diabetes   Resume metformin   insulin sliding scale coverage.  * Coronary artery disease  Her last MI was in 2000, she follows regularly with Dr. Clayborn Bigness every year   Cont home meds, stable.  * Hypertension  Continue home meds, hold lasix.  * GERD  Continue Protonix.   All the records are reviewed and case discussed with Care Management/Social Workerr. Management plans discussed with the patient, family and they are in agreement.  CODE STATUS: full  TOTAL TIME TAKING CARE OF THIS PATIENT: 35 minutes.     POSSIBLE D/C IN 1-2 DAYS, DEPENDING ON CLINICAL CONDITION.   Vaughan Basta M.D on 10/16/2015   Between 7am to 6pm - Pager - [phone]  After 6pm go to  [email or web address] - password [email or web address] [facility]  Office  [phone]  CC: Primary care physician; [name], MD  Note: This dictation was prepared with Dragon dictation along with smaller phrase technology. Any transcriptional errors that result from this process are unintentional.

## 2015-10-17 LAB — URINE CULTURE

## 2015-10-17 LAB — GLUCOSE, CAPILLARY
Glucose-Capillary: 151 mg/dL — ABNORMAL HIGH (ref 65–99)
Glucose-Capillary: 133 mg/dL — ABNORMAL HIGH (ref 65–99)
Glucose-Capillary: 131 mg/dL — ABNORMAL HIGH (ref 65–99)
Glucose-Capillary: 167 mg/dL — ABNORMAL HIGH (ref 65–99)

## 2015-10-17 MED ORDER — SENNOSIDES-DOCUSATE SODIUM 8.6-50 MG PO TABS
2.0000 | ORAL_TABLET | Freq: Two times a day (BID) | ORAL | Status: DC
  Administered 2015-10-17: 2 via ORAL
  Filled 2015-10-17 (×2): qty 2

## 2015-10-17 MED ORDER — POLYETHYLENE GLYCOL 3350 17 G PO PACK: 17.0000 g | PACK | Freq: Every day | ORAL | Status: DC | PRN

## 2015-10-17 NOTE — Clinical Documentation Improvement (Signed)
Internal Medicine at [facility] and/or Consultants  Please document query responses in the progress notes and discharge summary, not on the CDI BPA form in [facility]. Thank you!  "Concern for sepsis" is documented in the urology consult note 10/15/15.  WBC on admission - 14.5.  Approximately 6 hours after arrival to the ED, patient's temp was 101.4;  Pulse in the 116'F;  Systolic BP in the 79'U to 100's.  Please document if the diagnosis of Sepsis was:  - Confirmed and present on admission  - Ruled Out and not applicable to this admission  - Unable to clinically determine   Please exercise your independent, professional judgment when responding. A specific answer is not anticipated or expected.   Thank You, Erling Conte  RN BSN CCDS [id number] Health Information Management [place]

## 2015-10-17 NOTE — Progress Notes (Addendum)
[place] at [place] NAME: Armonii Sieh    MR#:  [id number]  DATE OF BIRTH:  February 02, 1946  SUBJECTIVE:  CHIEF COMPLAINT:   Chief Complaint  Patient presents with  . Abdominal Pain   S/p CYSTOSCOPY WITH URETEROSCOPY AND STENT PLACEMENT placed by Urology 10/15/15.   Still have fever today. BP running on low normal side. Having bladder spasms, soaked her bed sheets and not feeling well  REVIEW OF SYSTEMS:  CONSTITUTIONAL:+ for fever, no fatigue or weakness.  EYES: No blurred or double vision.  EARS, NOSE, AND THROAT: No tinnitus or ear pain.  RESPIRATORY: No cough, shortness of breath, wheezing or hemoptysis.  CARDIOVASCULAR: No chest pain, orthopnea, edema.  GASTROINTESTINAL: No nausea, vomiting, diarrhea or abdominal pain.  GENITOURINARY: No dysuria, hematuria. + kidney stone ENDOCRINE: No polyuria, nocturia,  HEMATOLOGY: No anemia, easy bruising or bleeding SKIN: No rash or lesion. MUSCULOSKELETAL: No joint pain or arthritis.   NEUROLOGIC: No tingling, numbness, weakness.  PSYCHIATRY: No anxiety or depression.   Review of Systems  Constitutional: Positive for fever.    DRUG ALLERGIES:  No Known Allergies  VITALS:  Blood pressure 128/56, pulse 104, temperature 98.2 F (36.8 C), temperature source Oral, resp. rate 20, height 5' 3"  (1.6 m), weight 83.915 kg (185 lb), SpO2 90 %.  PHYSICAL EXAMINATION:  GENERAL:  70 y.o.-year-old patient lying in the bed with no acute distress.  EYES: Pupils equal, round, reactive to light and accommodation. No scleral icterus. Extraocular muscles intact.  HEENT: Head atraumatic, normocephalic. Oropharynx and nasopharynx clear.  NECK:  Supple, no jugular venous distention. No thyroid enlargement, no tenderness.  LUNGS: Normal breath sounds bilaterally, no wheezing, rales,rhonchi or crepitation. No use of accessory muscles of respiration.  CARDIOVASCULAR: S1, S2 normal. Systolic murmurs,   ABDOMEN: Soft,  nontender, nondistended. Bowel sounds present. No organomegaly or mass. Foley in place. EXTREMITIES: No pedal edema, cyanosis, or clubbing.  NEUROLOGIC: Cranial nerves II through XII are intact. Muscle strength 5/5 in all extremities. Sensation intact. Gait not checked.  PSYCHIATRIC: The patient is alert and oriented x 3.  SKIN: No obvious rash, lesion, or ulcer.   Physical Exam LABORATORY PANEL:   CBC  Recent Labs Lab 10/16/15 0403  WBC 15.3*  HGB 13.8  HCT 40.9  PLT 112*   ------------------------------------------------------------------------------------------------------------------  Chemistries   Recent Labs Lab 10/15/15 1428 10/16/15 0403  NA 133* 136  K 4.2 4.2  CL 97* 98*  CO2 23 27  GLUCOSE 147* 148*  BUN 21* 25*  CREATININE 0.73 1.01*  CALCIUM 10.0 9.2  AST 36  --   ALT 50  --   ALKPHOS 73  --   BILITOT 0.6  --     ASSESSMENT AND PLAN:   Principal Problem:   UTI (lower urinary tract infection) Active Problems:   Renal stone  * sepsis: due to UTI - present on admission - now treated and resolved  * E. coli UTI  continue IV ceftriaxone per urine culture.  * Kidney stone  s/p CYSTOSCOPY WITH URETEROSCOPY AND STENT PLACEMENT on 6/13 by Urology  * Diabetes   Resume metformin   insulin sliding scale coverage.  * Coronary artery disease  Her last MI was in 2000, she follows regularly with Dr. Clayborn Bigness every year   Cont home meds, stable.  * Hypertension  Continue home meds, hold lasix.  * GERD  Continue Protonix.   All the records are reviewed and case discussed with Care  Management/Social Worker. Management plans discussed with the patient, family and they are in agreement.  CODE STATUS: full  TOTAL TIME TAKING CARE OF THIS PATIENT: 35 minutes.     POSSIBLE D/C IN 1-2 DAYS, DEPENDING ON CLINICAL CONDITION.   [facility], Terrisha Lopata M.D on 10/17/2015   Between 7am to 6pm - Pager - [phone]  After 6pm go to [email or web address] -  password [email or web address] [facility]  Office  [phone]  CC: Primary care physician; [name], MD  Note: This dictation was prepared with Dragon dictation along with smaller phrase technology. Any transcriptional errors that result from this process are unintentional.

## 2015-10-18 ENCOUNTER — Telehealth: Payer: Self-pay | Admitting: Internal Medicine

## 2015-10-18 LAB — CBC
HCT: 34.2 % — ABNORMAL LOW (ref 35.0–47.0)
Hemoglobin: 11.5 g/dL — ABNORMAL LOW (ref 12.0–16.0)
MCH: 28 pg (ref 26.0–34.0)
MCHC: 33.6 g/dL (ref 32.0–36.0)
MCV: 83.4 fL (ref 80.0–100.0)
Platelets: 103 10*3/uL — ABNORMAL LOW (ref 150–440)
RBC: 4.1 MIL/uL (ref 3.80–5.20)
RDW: 13.5 % (ref 11.5–14.5)
WBC: 6 10*3/uL (ref 3.6–11.0)

## 2015-10-18 LAB — BASIC METABOLIC PANEL
Anion gap: 6 (ref 5–15)
BUN: 19 mg/dL (ref 6–20)
CO2: 27 mmol/L (ref 22–32)
Calcium: 8.8 mg/dL — ABNORMAL LOW (ref 8.9–10.3)
Chloride: 103 mmol/L (ref 101–111)
Creatinine, Ser: 0.69 mg/dL (ref 0.44–1.00)
GFR calc Af Amer: >60 mL/min (ref >60)
GFR calc non Af Amer: >60 mL/min (ref >60)
Glucose, Bld: 127 mg/dL — ABNORMAL HIGH (ref 65–99)
Potassium: 4.1 mmol/L (ref 3.5–5.1)
Sodium: 136 mmol/L (ref 135–145)

## 2015-10-18 LAB — GLUCOSE, CAPILLARY
Glucose-Capillary: 150 mg/dL — ABNORMAL HIGH (ref 65–99)
Glucose-Capillary: 211 mg/dL — ABNORMAL HIGH (ref 65–99)

## 2015-10-18 MED ORDER — ACETAMINOPHEN 325 MG PO TABS: 650.0000 mg | ORAL_TABLET | Freq: Four times a day (QID) | ORAL | Status: DC | PRN

## 2015-10-18 MED ORDER — TAMSULOSIN HCL 0.4 MG PO CAPS: 0.4000 mg | ORAL_CAPSULE | Freq: Every day | ORAL | Status: DC

## 2015-10-18 MED ORDER — PHENAZOPYRIDINE HCL 100 MG PO TABS: 100.0000 mg | ORAL_TABLET | Freq: Three times a day (TID) | ORAL | Status: DC

## 2015-10-18 MED ORDER — CIPROFLOXACIN HCL 250 MG PO TABS: 250.0000 mg | ORAL_TABLET | Freq: Two times a day (BID) | ORAL | Status: DC

## 2015-10-18 MED ORDER — OXYBUTYNIN CHLORIDE 5 MG PO TABS: 5.0000 mg | ORAL_TABLET | Freq: Three times a day (TID) | ORAL | Status: DC

## 2015-10-18 MED ORDER — OXYCODONE-ACETAMINOPHEN 5-325 MG PO TABS: 1.0000 | ORAL_TABLET | Freq: Four times a day (QID) | ORAL | Status: DC | PRN

## 2015-10-18 NOTE — Telephone Encounter (Signed)
Please note, thanks

## 2015-10-18 NOTE — Discharge Summary (Signed)
[facility] NAME: Kimberly Huff    MR#:  [id number]  DATE OF BIRTH:  [birth year]  DATE OF ADMISSION:  10/15/2015 ADMITTING PHYSICIAN: Cleon Gustin, MD  DATE OF DISCHARGE: 10/18/15   PRIMARY CARE PHYSICIAN: [name], MD    ADMISSION DIAGNOSIS:  Ureteral stone [N20.1] UTI (lower urinary tract infection) [N39.0] Left flank pain [R10.9]  DISCHARGE DIAGNOSIS:  Principal Problem:   UTI (lower urinary tract infection) Active Problems:   Renal stone   SECONDARY DIAGNOSIS:   Past Medical History  Diagnosis Date  . Kidney stone   . Diverticulosis 2005    Dr Jamal Collin  . Coronary artery disease   . Hypertension   . Hyperlipidemia   . Hypopotassemia   . Atrophic kidney     right  . Osteoarthritis   . Coronary atherosclerosis   . Obesity   . Myocardial infarction ([place]) 2000  . Recurrent UTI   . Steatohepatitis 05/30/10    Brunt Stage 3  . Allergy   . Blood transfusion   . Cataract   . Neuromuscular disorder ([facility])     "achy muscles"  . External hemorrhoids without mention of complication 1224  . Unspecified essential hypertension 2014  . Heart murmur 2012    found by Dr. Jamal Collin  . H/O cardiac catheterization 2005  . Spinal stenosis of cervical region   . Blood transfusion without reported diagnosis   . Clotting disorder ([facility])     blood clots in abdomen after surgery  . GERD (gastroesophageal reflux disease)   . Ulcer   . Type II or unspecified type diabetes mellitus without mention of complication, uncontrolled     Patient does takes Metformin and recommended to stop for 48 hours.  . Diabetes mellitus     HOSPITAL COURSE:   * sepsis: due to UTI - present on admission, meets criteria with hypotension, tachycardia, leukocytosis - now treated and resolved  * Acute cystitis Urine culture is contaminated with multiple species, patient is clinically doing fine and will discharge patient home with by  mouth ciprofloxacin. Discussed with urology Dr. Erlene Quan.  Treated with IV ceftriaxone per past urine culture with Escherichia coli, sensitive to ciprofloxacin from February 2016 culture results  * Kidney stone- left renal   s/p CYSTOSCOPY WITH URETEROSCOPY AND STENT PLACEMENT on 6/13 by Urology Dr. Erlene Quan is recommending to discharge the patient with Flomax, oxybutynin 5 mg 3 times a day as needed for bladder bladder spasms and narcotics as needed for pain control Discontinue Foley catheter   * Diabetes  Resume metformin  insulin sliding scale coverage.  * Coronary artery disease  Her last MI was in 2000, she follows regularly with Dr. Clayborn Bigness every year  Cont home meds, stable.  * Hypertension  Continue home meds, hold lasix.  * GERD  Continue Protonix.  DISCHARGE CONDITIONS:   fair  CONSULTS OBTAINED:  Treatment Team:  Hollice Espy, MD   PROCEDURES  none  DRUG ALLERGIES:  No Known Allergies  DISCHARGE MEDICATIONS:   Current Discharge Medication List    START taking these medications   Details  acetaminophen (TYLENOL) 325 MG tablet Take 2 tablets (650 mg total) by mouth every 6 (six) hours as needed for mild pain or fever.    ciprofloxacin (CIPRO) 250 MG tablet Take 1 tablet (250 mg total) by mouth 2 (two) times daily. Qty: 10 tablet, Refills: 0    oxybutynin (DITROPAN) 5 MG tablet Take 1  tablet (5 mg total) by mouth 3 (three) times daily. Qty: 90 tablet, Refills: 0    oxyCODONE-acetaminophen (PERCOCET/ROXICET) 5-325 MG tablet Take 1 tablet by mouth every 6 (six) hours as needed for moderate pain or severe pain. Qty: 30 tablet, Refills: 0    phenazopyridine (PYRIDIUM) 100 MG tablet Take 1 tablet (100 mg total) by mouth 3 (three) times daily with meals. Qty: 90 tablet, Refills: 0    tamsulosin (FLOMAX) 0.4 MG CAPS capsule Take 1 capsule (0.4 mg total) by mouth daily. Qty: 30 capsule, Refills: 0      CONTINUE these medications which have NOT  CHANGED   Details  albuterol (PROVENTIL HFA;VENTOLIN HFA) 108 (90 Base) MCG/ACT inhaler Inhale 2 puffs into the lungs every 6 (six) hours as needed for wheezing or shortness of breath.    aspirin EC 325 MG tablet Take 325 mg by mouth daily.    losartan (COZAAR) 50 MG tablet Take 50 mg by mouth daily.    metFORMIN (GLUCOPHAGE) 850 MG tablet Take 850 mg by mouth 2 (two) times daily with a meal.    omeprazole (PRILOSEC) 40 MG capsule Take 40 mg by mouth daily.    potassium chloride (K-DUR,KLOR-CON) 10 MEQ tablet Take 10 mEq by mouth daily.    simvastatin (ZOCOR) 40 MG tablet Take 40 mg by mouth every evening.      STOP taking these medications     furosemide (LASIX) 40 MG tablet      ibuprofen (ADVIL,MOTRIN) 800 MG tablet      nitrofurantoin, macrocrystal-monohydrate, (MACROBID) 100 MG capsule          DISCHARGE INSTRUCTIONS:   Activity as tolerated  Diet cardiac, diabetic Follow-up with primary care physician in a week  Follow-up with urology Dr. Erlene Quan in 10 days Follow-up with cardiology Dr. Clayborn Bigness as recommended   DIET:  Cardiac diet,diabetic  DISCHARGE CONDITION:  Fair  ACTIVITY:  Activity as tolerated  OXYGEN:  Home Oxygen: No.   Oxygen Delivery: room air  DISCHARGE LOCATION:  home   If you experience worsening of your admission symptoms, develop shortness of breath, life threatening emergency, suicidal or homicidal thoughts you must seek medical attention immediately by calling 911 or calling your MD immediately  if symptoms less severe.  You Must read complete instructions/literature along with all the possible adverse reactions/side effects for all the Medicines you take and that have been prescribed to you. Take any new Medicines after you have completely understood and accpet all the possible adverse reactions/side effects.   Please note  You were cared for by a hospitalist during your hospital stay. If you have any questions about your  discharge medications or the care you received while you were in the hospital after you are discharged, you can call the unit and asked to speak with the hospitalist on call if the hospitalist that took care of you is not available. Once you are discharged, your primary care physician will handle any further medical issues. Please note that NO REFILLS for any discharge medications will be authorized once you are discharged, as it is imperative that you return to your primary care physician (or establish a relationship with a primary care physician if you do not have one) for your aftercare needs so that they can reassess your need for medications and monitor your lab values.     Today  Chief Complaint  Patient presents with  . Abdominal Pain   Patient is doing fine. Reporting bladder spasms. Denies any  nausea or vomiting. Feels comfortable to go home.  ROS:  CONSTITUTIONAL: Denies fevers, chills. Denies any fatigue, weakness.  EYES: Denies blurry vision, double vision, eye pain. EARS, NOSE, THROAT: Denies tinnitus, ear pain, hearing loss. RESPIRATORY: Denies cough, wheeze, shortness of breath.  CARDIOVASCULAR: Denies chest pain, palpitations, edema.  GASTROINTESTINAL: Denies nausea, vomiting, diarrhea, abdominal pain. Denies bright red blood per rectum. GENITOURINARY: Intermittent episodes of bladder spasms. Denies dysuria, hematuria. ENDOCRINE: Denies nocturia or thyroid problems. HEMATOLOGIC AND LYMPHATIC: Denies easy bruising or bleeding. SKIN: Denies rash or lesion. MUSCULOSKELETAL: Denies pain in neck, back, shoulder, knees, hips or arthritic symptoms.  NEUROLOGIC: Denies paralysis, paresthesias.  PSYCHIATRIC: Denies anxiety or depressive symptoms.   VITAL SIGNS:  Blood pressure 130/59, pulse 71, temperature 98 F (36.7 C), temperature source Oral, resp. rate 17, height 5' 3"  (1.6 m), weight 83.915 kg (185 lb), SpO2 96 %.  I/O:    Intake/Output Summary (Last 24 hours) at  10/18/15 1320 Last data filed at 10/18/15 1126  Gross per 24 hour  Intake    480 ml  Output   1950 ml  Net  -1470 ml    PHYSICAL EXAMINATION:  GENERAL:  70 y.o.-year-old patient lying in the bed with no acute distress.  EYES: Pupils equal, round, reactive to light and accommodation. No scleral icterus. Extraocular muscles intact.  HEENT: Head atraumatic, normocephalic. Oropharynx and nasopharynx clear.  NECK:  Supple, no jugular venous distention. No thyroid enlargement, no tenderness.  LUNGS: Normal breath sounds bilaterally, no wheezing, rales,rhonchi or crepitation. No use of accessory muscles of respiration.  CARDIOVASCULAR: S1, S2 normal. No murmurs, rubs, or gallops.  ABDOMEN: Soft, non-tender, non-distended. Bowel sounds present. No organomegaly or mass.  EXTREMITIES: No pedal edema, cyanosis, or clubbing.  NEUROLOGIC: Cranial nerves II through XII are intact. Muscle strength 5/5 in all extremities. Sensation intact. Gait not checked.  PSYCHIATRIC: The patient is alert and oriented x 3.  SKIN: No obvious rash, lesion, or ulcer.   DATA REVIEW:   CBC  Recent Labs Lab 10/18/15 0432  WBC 6.0  HGB 11.5*  HCT 34.2*  PLT 103*    Chemistries   Recent Labs Lab 10/15/15 1428  10/18/15 0432  NA 133*  < > 136  K 4.2  < > 4.1  CL 97*  < > 103  CO2 23  < > 27  GLUCOSE 147*  < > 127*  BUN 21*  < > 19  CREATININE 0.73  < > 0.69  CALCIUM 10.0  < > 8.8*  AST 36  --   --   ALT 50  --   --   ALKPHOS 73  --   --   BILITOT 0.6  --   --   < > = values in this interval not displayed.  Cardiac Enzymes No results for input(s): TROPONINI in the last 168 hours.  Microbiology Results  Results for orders placed or performed during the hospital encounter of 10/15/15  Urine culture     Status: Abnormal   Collection Time: 10/15/15  2:44 PM  Result Value Ref Range Status   Specimen Description URINE, RANDOM  Final   Special Requests NONE  Final   Culture MULTIPLE SPECIES PRESENT,  SUGGEST RECOLLECTION (A)  Final   Report Status 10/17/2015 FINAL  Final  CULTURE, BLOOD (ROUTINE X 2) w Reflex to ID Panel     Status: None (Preliminary result)   Collection Time: 10/16/15 11:30 AM  Result Value Ref Range Status   Specimen  Description BLOOD LEFT HAND  Final   Special Requests   Final    BOTTLES DRAWN AEROBIC AND ANAEROBIC AER 6ML ANA 5ML   Culture NO GROWTH 2 DAYS  Final   Report Status PENDING  Incomplete  CULTURE, BLOOD (ROUTINE X 2) w Reflex to ID Panel     Status: None (Preliminary result)   Collection Time: 10/16/15 11:30 AM  Result Value Ref Range Status   Specimen Description BLOOD RIGHT HAND  Final   Special Requests   Final    BOTTLES DRAWN AEROBIC AND ANAEROBIC AER 5ML ANA 4ML   Culture NO GROWTH 2 DAYS  Final   Report Status PENDING  Incomplete    RADIOLOGY:  Ct Renal Stone Study  10/15/2015  CLINICAL DATA:  Left flank pain EXAM: CT ABDOMEN AND PELVIS WITHOUT CONTRAST TECHNIQUE: Multidetector CT imaging of the abdomen and pelvis was performed following the standard protocol without IV contrast. COMPARISON:  None. FINDINGS: Lower chest: Lung bases clear without infiltrate or effusion. Mild coronary calcification. Hepatobiliary: Fatty infiltration of the liver without focal liver lesion. Liver size normal. Gallbladder and bile ducts normal. Pancreas: Negative Spleen: Normal splenic size.  15 mm splenule. Adrenals/Urinary Tract: Marked atrophy of the left kidney. Left renal hydronephrosis due to a stone in the proximal left ureter. Stone measures 6 x 11 mm. Multiple small left renal calculi. No renal obstruction or stone on the right. 2.5 cm right renal lesion with water density compatible with cyst. Normal bladder. Stomach/Bowel: Normal stomach. Negative for bowel obstruction. No bowel edema or mass. Sigmoid diverticulosis. Negative for diverticulitis. Normal appendix. Vascular/Lymphatic: Atherosclerotic aorta without aneurysm. No lymphadenopathy. Reproductive:  Hysterectomy.  No pelvic mass. Other: No free fluid. Periumbilical hernia containing abdominal fat. Small hernia. Musculoskeletal: Moderate disc and facet degeneration throughout the lumbar spine with grade 1 anterior slip L3-4 and L4-5. Negative for fracture. IMPRESSION: Left renal obstruction by a large stone at the UPJ measuring 6 x 11 mm. Marked left renal atrophy with multiple small left renal calculi. 2.5 cm right renal lesion most likely a cyst. Fatty liver Normal appendix Sigmoid diverticulosis without diverticulitis. Electronically Signed   By: Franchot Gallo M.D.   On: 10/15/2015 15:53    EKG:  No orders found for this or any previous visit.    Management plans discussed with the patient, family and they are in agreement.  CODE STATUS:     Code Status Orders        Start     Ordered   10/15/15 1836  Full code   Continuous     10/15/15 1835    Code Status History    Date Active Date Inactive Code Status Order ID Comments User Context   This patient has a current code status but no historical code status.    Advance Directive Documentation        Most Recent Value   Type of Advance Directive  Healthcare Power of Attorney [son (Tom Boyum)]   Pre-existing out of facility DNR order (yellow form or pink MOST form)     "MOST" Form in Place?        TOTAL TIME TAKING CARE OF THIS PATIENT: 45  minutes.   Note: This dictation was prepared with Dragon dictation along with smaller phrase technology. Any transcriptional errors that result from this process are unintentional.   @[facility] @  on 10/18/2015 at 1:20 PM  Between 7am to 6pm - Pager - [phone]  After 6pm go to [email or web address] - password  [facility]  [facility]  Office  [phone]  CC: Primary care physician; [name], MD

## 2015-10-18 NOTE — Discharge Instructions (Signed)
Activity as tolerated Diet cardiac, diabetic Follow-up with primary care physician in a week Follow-up with urology Dr. Erlene Quan in 10 days Follow-up with cardiology Dr. Clayborn Bigness as recommended

## 2015-10-18 NOTE — Telephone Encounter (Signed)
HFU, Pt is being discharged today. Dx was stint placement in bladder. Pt is scheduled for 7/23 @3pm . Thank you!

## 2015-10-18 NOTE — Progress Notes (Signed)
10/18/2015 1:47 PM  [name] Leanor Kail to be D/C'd Home per MD order.  Discussed prescriptions and follow up appointments with the patient. Prescriptions given to patient, medication list explained in detail. Pt verbalized understanding.    Medication List    STOP taking these medications        furosemide 40 MG tablet  Commonly known as:  LASIX     ibuprofen 800 MG tablet  Commonly known as:  ADVIL,MOTRIN     nitrofurantoin (macrocrystal-monohydrate) 100 MG capsule  Commonly known as:  MACROBID      TAKE these medications        acetaminophen 325 MG tablet  Commonly known as:  TYLENOL  Take 2 tablets (650 mg total) by mouth every 6 (six) hours as needed for mild pain or fever.     albuterol 108 (90 Base) MCG/ACT inhaler  Commonly known as:  PROVENTIL HFA;VENTOLIN HFA  Inhale 2 puffs into the lungs every 6 (six) hours as needed for wheezing or shortness of breath.     aspirin EC 325 MG tablet  Take 325 mg by mouth daily.     ciprofloxacin 250 MG tablet  Commonly known as:  CIPRO  Take 1 tablet (250 mg total) by mouth 2 (two) times daily.     losartan 50 MG tablet  Commonly known as:  COZAAR  Take 50 mg by mouth daily.     metFORMIN 850 MG tablet  Commonly known as:  GLUCOPHAGE  Take 850 mg by mouth 2 (two) times daily with a meal.     omeprazole 40 MG capsule  Commonly known as:  PRILOSEC  Take 40 mg by mouth daily.     oxybutynin 5 MG tablet  Commonly known as:  DITROPAN  Take 1 tablet (5 mg total) by mouth 3 (three) times daily.     oxyCODONE-acetaminophen 5-325 MG tablet  Commonly known as:  PERCOCET/ROXICET  Take 1 tablet by mouth every 6 (six) hours as needed for moderate pain or severe pain.     phenazopyridine 100 MG tablet  Commonly known as:  PYRIDIUM  Take 1 tablet (100 mg total) by mouth 3 (three) times daily with meals.     potassium chloride 10 MEQ tablet  Commonly known as:  K-DUR,KLOR-CON  Take 10 mEq by mouth daily.     simvastatin 40 MG  tablet  Commonly known as:  ZOCOR  Take 40 mg by mouth every evening.     tamsulosin 0.4 MG Caps capsule  Commonly known as:  FLOMAX  Take 1 capsule (0.4 mg total) by mouth daily.        Filed Vitals:   10/17/15 2058 10/18/15 0545  BP: 117/60 130/59  Pulse: 81 71  Temp: 97.3 F (36.3 C) 98 F (36.7 C)  Resp: 18 17    Skin clean, dry and intact without evidence of skin break down, no evidence of skin tears noted. IV catheter discontinued intact. Site without signs and symptoms of complications. Dressing and pressure applied. Pt denies pain at this time. No complaints noted.  An After Visit Summary was printed and given to the patient. Patient escorted via [place], and D/C home via private auto.  Dola Argyle

## 2015-10-18 NOTE — Telephone Encounter (Signed)
Will follow as appropriate.

## 2015-10-20 ENCOUNTER — Other Ambulatory Visit: Payer: Self-pay | Admitting: Internal Medicine

## 2015-10-20 ENCOUNTER — Telehealth: Payer: Self-pay | Admitting: Internal Medicine

## 2015-10-20 DIAGNOSIS — R928 Other abnormal and inconclusive findings on diagnostic imaging of breast: Secondary | ICD-10-CM

## 2015-10-20 NOTE — Telephone Encounter (Signed)
Can you canel the message to Kimberly Huff? She has NOT HAD A N ABNORMAL MAMMOGRAM OR OTHERWISE.  WRONG PATIENT

## 2015-10-21 ENCOUNTER — Telehealth: Payer: Self-pay

## 2015-10-21 NOTE — Telephone Encounter (Addendum)
Transition Care Management Follow-up Telephone Call   Date discharged? 10/18/15   How have you been since you were released from the hospital? I am still in some pain and have vaginal pressure and bladder spasms when I use the restroom, but it is getting a little better.  I will finish my antibiotic today.  Feet are beginning to swell a little. I am drinking plenty of water.  I have not checked my BS since I have been home, but will start. Decrease in appetite.    Do you understand why you were in the hospital? YES, UTI with extreme pain.   Do you understand the discharge instructions? YES, just to increase activity as tolerated and drink plenty of fluids.   Where were you discharged to? HOME.   Items Reviewed:  Medications reviewed: YES, STOPPED TAKING LASIX, MACROBID AND NOT TAKING TYLENOL.  TAKING IBUPROFEN 800, CIPRO 250MG, DITROPAN 5MG, OXYCODONE ACETIMINOPHEN 5-325, PYRIDUIM 100MG, FLOMAX 0.5MG.  TAKING ALL OTHER SCHEDULED MEDICATIONS WHICH HAVE NOT CHANGED.  Allergies reviewed: YES, NO KNOWN ALLERGIES.  Dietary changes reviewed: YES, CARDIAC AND DIABETIC, NO ISSUES.  Referrals reviewed: SCHEDULED FOR PCP, UROLOGY, AND CARDIOLOGY.  I JUST SAW CARDIOLOGIST IN April AND WILL WAIT TO SCHEDULE AGAIN AFTER MY VISIT WITH PCP.   Functional Questionnaire:   Activities of Daily Living (ADLs):   She states they are independent in the following: INDEPENDENT IN ALL ADLS. States they require assistance with the following: DOES NOT REQUIRE ASSISTANCE AT THIS TIME.   Any transportation issues/concerns?: NO.   Any patient concerns? YES, MY FEET ARE BEGINNING TO SWELL AND I HAVE STOPPED TAKING THE LASIX BECAUSE I AM TAKING THE FLOMAX. NEEDS AN EXCUSE FOR NOT BEING ABLE TO GO TO WORK.   Confirmed importance and date/time of follow-up visits scheduled YES, APPOINTMENT SCHEDULED 11/24/15 AT 3pm.  HOWEVER, I WOULD LIKE TO COME SOONER IF THERE IS AVAILABILITY.  Will overview schedule and  speak with PCP to see if there is any other availability.  Provider Appointment booked with Dr, Derrel Nip (PCP).  Confirmed with patient if condition begins to worsen call PCP or go to the ER.  Patient was given the office number and encouraged to call back with question or concerns.  : YES, VERBALIZED UNDERSTANDING.

## 2015-10-22 LAB — CULTURE, BLOOD (ROUTINE X 2)
Culture: NO GROWTH
Culture: NO GROWTH

## 2015-10-23 ENCOUNTER — Ambulatory Visit (INDEPENDENT_AMBULATORY_CARE_PROVIDER_SITE_OTHER): Payer: BLUE CROSS/BLUE SHIELD | Admitting: Internal Medicine

## 2015-10-23 ENCOUNTER — Encounter: Payer: Self-pay | Admitting: Internal Medicine

## 2015-10-23 VITALS — BP 140/78 | HR 69 | Temp 98.3°F | Resp 12 | Ht 63.0 in

## 2015-10-23 DIAGNOSIS — E119 Type 2 diabetes mellitus without complications: Secondary | ICD-10-CM | POA: Diagnosis not present

## 2015-10-23 DIAGNOSIS — N261 Atrophy of kidney (terminal): Secondary | ICD-10-CM

## 2015-10-23 DIAGNOSIS — R319 Hematuria, unspecified: Secondary | ICD-10-CM

## 2015-10-23 DIAGNOSIS — I10 Essential (primary) hypertension: Secondary | ICD-10-CM

## 2015-10-23 DIAGNOSIS — Z09 Encounter for follow-up examination after completed treatment for conditions other than malignant neoplasm: Secondary | ICD-10-CM

## 2015-10-23 DIAGNOSIS — E1122 Type 2 diabetes mellitus with diabetic chronic kidney disease: Secondary | ICD-10-CM

## 2015-10-23 DIAGNOSIS — N390 Urinary tract infection, site not specified: Secondary | ICD-10-CM | POA: Diagnosis not present

## 2015-10-23 DIAGNOSIS — E1159 Type 2 diabetes mellitus with other circulatory complications: Secondary | ICD-10-CM

## 2015-10-23 DIAGNOSIS — E669 Obesity, unspecified: Secondary | ICD-10-CM

## 2015-10-23 DIAGNOSIS — I152 Hypertension secondary to endocrine disorders: Secondary | ICD-10-CM

## 2015-10-23 DIAGNOSIS — E1169 Type 2 diabetes mellitus with other specified complication: Secondary | ICD-10-CM

## 2015-10-23 LAB — POCT URINALYSIS DIPSTICK
Bilirubin, UA: NEGATIVE
Glucose, UA: 100
Ketones, UA: NEGATIVE
Nitrite, UA: POSITIVE
Protein, UA: 30
Spec Grav, UA: 1.01
Urobilinogen, UA: 1
pH, UA: 5

## 2015-10-23 MED ORDER — PROMETHAZINE HCL 12.5 MG PO TABS
12.5000 mg | ORAL_TABLET | Freq: Three times a day (TID) | ORAL | Status: DC | PRN
Start: 2015-10-23 — End: 2015-11-20

## 2015-10-23 MED ORDER — IBUPROFEN 800 MG PO TABS: 800.0000 mg | ORAL_TABLET | Freq: Three times a day (TID) | ORAL | Status: DC | PRN

## 2015-10-23 NOTE — Progress Notes (Signed)
Subjective:  Patient ID: Kimberly Huff, female    DOB: [birth year]  Age: 70 y.o. MRN: [id number]  CC: The primary encounter diagnosis was Urinary tract infection with hematuria, site unspecified. Diagnoses of Atrophy of left kidney, UTI (lower urinary tract infection), Hospital discharge follow-up, Obesity, diabetes, and hypertension syndrome ([place]), and Controlled type 2 diabetes mellitus with chronic kidney disease, without long-term current use of insulin, unspecified CKD stage ([place]) were also pertinent to this visit.  HPI Kimberly Huff presents for hospital follow up.  She was admitted to [facility] on June 13th with severe flank pain  That began on the morning of admission accompanied by nausea.  After 12 hours of progressive unrelenting pain,  she went   Urgent Care and was sent to ER with signs and symptoms of sepsis secondary to UTI  (hypotension and tachycardia)   She underwent a stat CT and was found to have an  obstructing UPJ stone measuring 1.2 cm causing hydropnephrosis of her atrophic left kidney .   She was  treated with cystocscopy, left retrograde pyelography, and placement of a  6 x 26 JJ ureteral stent  on Jun 13 by Dr Erlene Quan .  Flomax and oxybutynin prescribed  for bladder spasm, dilaudid given for pain and discharged home on Jun 16 with empiric  cipro, and percocet after blood cultures were negative x 2 and urine culture was inconclusive with multiple species present  Urology Follow up this Friday.  Possible lithotripsy planned.  Was not given a strainer .  Has not passed the stone.  Has finished cipro (prescribed 5 days ) ,  Not taking  And not advised to take a probiotic.    Still having pressure in the bladder,  No severe pain.  using flomax and ditropan .  Drinking tons of water    Overdue for diabetes follow up,  Last seen in February 16, 2023.  Mother died in late Aug 16, 2022.  Does not want las done today despite being overdue for DM labs   Lab Results  Component Value Date   HGBA1C 5.7  01/24/2015       Outpatient Prescriptions Prior to Visit  Medication Sig Dispense Refill  . aspirin EC 325 MG tablet Take 325 mg by mouth daily.    Marland Kitchen losartan (COZAAR) 50 MG tablet Take 50 mg by mouth daily.    . metFORMIN (GLUCOPHAGE) 850 MG tablet Take 850 mg by mouth 2 (two) times daily with a meal.    . omeprazole (PRILOSEC) 40 MG capsule Take 40 mg by mouth daily.    Marland Kitchen oxybutynin (DITROPAN) 5 MG tablet Take 1 tablet (5 mg total) by mouth 3 (three) times daily. 90 tablet 0  . phenazopyridine (PYRIDIUM) 100 MG tablet Take 1 tablet (100 mg total) by mouth 3 (three) times daily with meals. 90 tablet 0  . potassium chloride (K-DUR,KLOR-CON) 10 MEQ tablet Take 10 mEq by mouth daily.    . simvastatin (ZOCOR) 40 MG tablet Take 40 mg by mouth every evening.    . tamsulosin (FLOMAX) 0.4 MG CAPS capsule Take 1 capsule (0.4 mg total) by mouth daily. 30 capsule 0  . ciprofloxacin (CIPRO) 250 MG tablet Take 1 tablet (250 mg total) by mouth 2 (two) times daily. (Patient not taking: Reported on 10/25/2015) 10 tablet 0  . oxyCODONE-acetaminophen (PERCOCET/ROXICET) 5-325 MG tablet Take 1 tablet by mouth every 6 (six) hours as needed for moderate pain or severe pain. (Patient not taking: Reported on 10/23/2015) 30 tablet 0  .  acetaminophen (TYLENOL) 325 MG tablet Take 2 tablets (650 mg total) by mouth every 6 (six) hours as needed for mild pain or fever. (Patient not taking: Reported on 10/25/2015)    . albuterol (PROVENTIL HFA;VENTOLIN HFA) 108 (90 Base) MCG/ACT inhaler Inhale 2 puffs into the lungs every 6 (six) hours as needed for wheezing or shortness of breath. Reported on 10/23/2015     No facility-administered medications prior to visit.    Review of Systems;  Patient denies headache, fevers, malaise, unintentional weight loss, skin rash, eye pain, sinus congestion and sinus pain, sore throat, dysphagia,  hemoptysis , cough, dyspnea, wheezing, chest pain, palpitations, orthopnea, edema, abdominal pain,  nausea, melena, diarrhea, constipation, flank pain, dysuria, hematuria, urinary  Frequency, nocturia, numbness, tingling, seizures,  Focal weakness, Loss of consciousness,  Tremor, insomnia, depression, anxiety, and suicidal ideation.      Objective:  BP 140/78 mmHg  Pulse 69  Temp(Src) 98.3 F (36.8 C) (Oral)  Resp 12  Ht 5' 3"  (1.6 m)  Wt   SpO2 95%  BP Readings from Last 3 Encounters:  10/25/15 122/75  10/23/15 140/78  10/18/15 130/59    Wt Readings from Last 3 Encounters:  10/15/15 185 lb (83.915 kg)  08/26/15 185 lb (83.915 kg)  06/27/15 185 lb (83.915 kg)    General appearance: alert, cooperative and appears stated age Ears: normal TM's and external ear canals both ears Throat: lips, mucosa, and tongue normal; teeth and gums normal Neck: no adenopathy, no carotid bruit, supple, symmetrical, trachea midline and thyroid not enlarged, symmetric, no tenderness/mass/nodules Back: symmetric, no curvature. ROM normal. No CVA tenderness. Lungs: clear to auscultation bilaterally Heart: regular rate and rhythm, S1, S2 normal, no murmur, click, rub or gallop Abdomen: soft, non-tender; bowel sounds normal; no masses,  no organomegaly Pulses: 2+ and symmetric Skin: Skin color, texture, turgor normal. No rashes or lesions Lymph nodes: Cervical, supraclavicular, and axillary nodes normal.  Lab Results  Component Value Date   HGBA1C 5.7 01/24/2015   HGBA1C 9.0* 09/10/2014   HGBA1C 7.0* 03/07/2014    Lab Results  Component Value Date   CREATININE 0.69 10/18/2015   CREATININE 1.01* 10/16/2015   CREATININE 0.73 10/15/2015    Lab Results  Component Value Date   WBC 6.0 10/18/2015   HGB 11.5* 10/18/2015   HCT 34.2* 10/18/2015   PLT 103* 10/18/2015   GLUCOSE 127* 10/18/2015   CHOL 100 01/24/2015   TRIG 124.0 01/24/2015   HDL 27.00* 01/24/2015   LDLDIRECT 63.0 01/24/2015   LDLCALC 49 01/24/2015   ALT 50 10/15/2015   AST 36 10/15/2015   NA 136 10/18/2015   K 4.1  10/18/2015   CL 103 10/18/2015   CREATININE 0.69 10/18/2015   BUN 19 10/18/2015   CO2 27 10/18/2015   TSH 0.76 09/10/2014   INR 1.0 09/10/2014   HGBA1C 5.7 01/24/2015   MICROALBUR 3.4* 01/24/2015    Ct Renal Stone Study  10/15/2015  CLINICAL DATA:  Left flank pain EXAM: CT ABDOMEN AND PELVIS WITHOUT CONTRAST TECHNIQUE: Multidetector CT imaging of the abdomen and pelvis was performed following the standard protocol without IV contrast. COMPARISON:  None. FINDINGS: Lower chest: Lung bases clear without infiltrate or effusion. Mild coronary calcification. Hepatobiliary: Fatty infiltration of the liver without focal liver lesion. Liver size normal. Gallbladder and bile ducts normal. Pancreas: Negative Spleen: Normal splenic size.  15 mm splenule. Adrenals/Urinary Tract: Marked atrophy of the left kidney. Left renal hydronephrosis due to a stone in the proximal left  ureter. Stone measures 6 x 11 mm. Multiple small left renal calculi. No renal obstruction or stone on the right. 2.5 cm right renal lesion with water density compatible with cyst. Normal bladder. Stomach/Bowel: Normal stomach. Negative for bowel obstruction. No bowel edema or mass. Sigmoid diverticulosis. Negative for diverticulitis. Normal appendix. Vascular/Lymphatic: Atherosclerotic aorta without aneurysm. No lymphadenopathy. Reproductive: Hysterectomy.  No pelvic mass. Other: No free fluid. Periumbilical hernia containing abdominal fat. Small hernia. Musculoskeletal: Moderate disc and facet degeneration throughout the lumbar spine with grade 1 anterior slip L3-4 and L4-5. Negative for fracture. IMPRESSION: Left renal obstruction by a large stone at the UPJ measuring 6 x 11 mm. Marked left renal atrophy with multiple small left renal calculi. 2.5 cm right renal lesion most likely a cyst. Fatty liver Normal appendix Sigmoid diverticulosis without diverticulitis. Electronically Signed   By: Franchot Gallo M.D.   On: 10/15/2015 15:53     Assessment & Plan:   Problem List Items Addressed This Visit    Type 2 diabetes mellitus, controlled, with renal complications ([place])    Historically  well-controlled on current medications .  LAST hemoglobin A1c WAS 5.7 . Patient is up to date on  annual eye exam  And has no history of  Microalbuminuria. She is  tolerating an  ACE/ARB for renal protection and hypertension . She is overdue for fasting labs . Hospital BS reviewed and ranged form 133 to 200   Lab Results  Component Value Date   HGBA1C 5.7 01/24/2015   Lab Results  Component Value Date   MICROALBUR 3.4* 01/24/2015             UTI (lower urinary tract infection)    She was treated initially with Rocephin and transitioned to Ciprofloxacin,  She continues to have suprapubic discomfort.  Repeat UA shows persistent inflammation , 3-6 WBCs, (previously TNTC) and culture grew E coli 75K colonies , resistant to Cipro but sensitive to Macrobid , Augmentin and fosfomycin.  Will recommend repeating a round of antibiotics with augmentin       Atrophic kidney   Hospital discharge follow-up    All labs , imaging studies and progress notes from admission were reviewed with patient today        Other Visit Diagnoses    Urinary tract infection with hematuria, site unspecified    -  Primary    Relevant Orders    Urinalysis, Routine w reflex microscopic (not at [facility]) (Completed)    Urine culture (Completed)    POCT Urinalysis Dipstick (Completed)    Obesity, diabetes, and hypertension syndrome (HCC)        Relevant Orders    Comprehensive metabolic panel    Lipid panel    Hemoglobin A1c    Microalbumin / creatinine urine ratio       I have discontinued Ms. Mccleave's promethazine. I have also changed her ibuprofen. Additionally, I am having her start on promethazine and amoxicillin-clavulanate. Lastly, I am having her maintain her losartan, metFORMIN, omeprazole, potassium chloride, simvastatin, aspirin EC, phenazopyridine,  oxyCODONE-acetaminophen, tamsulosin, oxybutynin, and furosemide.  Meds ordered this encounter  Medications  . DISCONTD: ibuprofen (ADVIL,MOTRIN) 800 MG tablet    Sig: Take 800 mg by mouth every 8 (eight) hours as needed.   . furosemide (LASIX) 40 MG tablet    Sig: Take 40 mg by mouth daily.   . promethazine (PHENERGAN) 12.5 MG tablet    Sig: Take 1 tablet (12.5 mg total) by mouth every 8 (eight) hours as  needed for nausea or vomiting.    Dispense:  30 tablet    Refill:  0  . ibuprofen (ADVIL,MOTRIN) 800 MG tablet    Sig: Take 1 tablet (800 mg total) by mouth every 8 (eight) hours as needed.    Dispense:  30 tablet    Refill:  5  . amoxicillin-clavulanate (AUGMENTIN) 875-125 MG tablet    Sig: Take 1 tablet by mouth 2 (two) times daily.    Dispense:  14 tablet    Refill:  0    Medications Discontinued During This Encounter  Medication Reason  . promethazine (PHENERGAN) 25 MG tablet   . ibuprofen (ADVIL,MOTRIN) 800 MG tablet Reorder    Follow-up: No Follow-up on file.   [name], MD

## 2015-10-23 NOTE — Progress Notes (Signed)
Pre-visit discussion using our clinic review tool. No additional management support is needed unless otherwise documented below in the visit note.  

## 2015-10-23 NOTE — Patient Instructions (Signed)
   I recommend  taking a  probiotic ( Align, Floraque or Culturelle), the generic version of one of these over the counter medications, or an alternative form (kombucha,  Yogurt, or another dietary source) for a minimum of 3 weeks to prevent a serious antibiotic associated diarrhea  Called clostridium dificile colitis.  Taking a probiotic may also prevent vaginitis due to yeast infections and can be continued indefinitely if you feel that it improves your digestion or your elimination (bowels).     Return when you are able to have diabetes labs done

## 2015-10-24 LAB — URINALYSIS, ROUTINE W REFLEX MICROSCOPIC
Bilirubin Urine: NEGATIVE
Ketones, ur: NEGATIVE
Nitrite: POSITIVE — AB
Specific Gravity, Urine: 1.015 (ref 1.000–1.030)
Total Protein, Urine: 30 — AB
Urine Glucose: 100 — AB
Urobilinogen, UA: 2 — AB (ref 0.0–1.0)
pH: 5.5 (ref 5.0–8.0)

## 2015-10-25 ENCOUNTER — Ambulatory Visit: Payer: BLUE CROSS/BLUE SHIELD | Admitting: Internal Medicine

## 2015-10-25 ENCOUNTER — Encounter: Payer: Self-pay | Admitting: Urology

## 2015-10-25 ENCOUNTER — Ambulatory Visit (INDEPENDENT_AMBULATORY_CARE_PROVIDER_SITE_OTHER): Payer: BLUE CROSS/BLUE SHIELD | Admitting: Urology

## 2015-10-25 ENCOUNTER — Telehealth: Payer: Self-pay | Admitting: Radiology

## 2015-10-25 VITALS — BP 122/75 | HR 67 | Ht 63.0 in

## 2015-10-25 DIAGNOSIS — N261 Atrophy of kidney (terminal): Secondary | ICD-10-CM | POA: Insufficient documentation

## 2015-10-25 DIAGNOSIS — I252 Old myocardial infarction: Secondary | ICD-10-CM | POA: Insufficient documentation

## 2015-10-25 DIAGNOSIS — I10 Essential (primary) hypertension: Secondary | ICD-10-CM | POA: Insufficient documentation

## 2015-10-25 DIAGNOSIS — E119 Type 2 diabetes mellitus without complications: Secondary | ICD-10-CM | POA: Insufficient documentation

## 2015-10-25 DIAGNOSIS — N200 Calculus of kidney: Secondary | ICD-10-CM | POA: Diagnosis not present

## 2015-10-25 LAB — URINE CULTURE: Colony Count: 75000

## 2015-10-25 NOTE — Progress Notes (Signed)
10/25/2015 10:17 AM   Kimberly Huff 1946/03/01 [id number]  Referring provider: [name], MD [place], [place] [address]  Chief Complaint  Patient presents with  . New Patient (Initial Visit)    Renal Stones    HPI: Kimberly Huff is a 70yo here for followup for nephrolithiasis. She is s/p L ureteral stent placemen ton 10/15/2015 for L ureteral calculus and sepsis.  This is her first stone event. She has mild discomfort from the stent. She denies flank pain. Urine culture showed multiple species. She just finished course of cipro.    PMH: Past Medical History  Diagnosis Date  . Kidney stone   . Diverticulosis 2005    Dr Jamal Collin  . Coronary artery disease   . Hypertension   . Hyperlipidemia   . Hypopotassemia   . Atrophic kidney     right  . Osteoarthritis   . Coronary atherosclerosis   . Obesity   . Myocardial infarction ([place]) 2000  . Recurrent UTI   . Steatohepatitis 05/30/10    Brunt Stage 3  . Allergy   . Blood transfusion   . Cataract   . Neuromuscular disorder ([place])     "achy muscles"  . External hemorrhoids without mention of complication 8315  . Unspecified essential hypertension 2014  . Heart murmur 2012    found by Dr. Jamal Collin  . H/O cardiac catheterization 2005  . Spinal stenosis of cervical region   . Blood transfusion without reported diagnosis   . Clotting disorder (HCC)     blood clots in abdomen after surgery  . GERD (gastroesophageal reflux disease)   . Ulcer   . Type II or unspecified type diabetes mellitus without mention of complication, uncontrolled     Patient does takes Metformin and recommended to stop for 48 hours.  . Diabetes mellitus     Surgical History: Past Surgical History  Procedure Laterality Date  . Breast mass excision      benign  . Ureterotomy  1960/1969    x 2 during childhood  . Abdominal hysterectomy  1982    complete  . Ptca  2000  . Vaginal hysterectomy      PT DENIES ABD HYSTERECTOMY    . Foot surgery  2008  . Cardiac stents  2000  . Tonsillectomy    . Salpingoophorectomy    . Colonoscopy  2004, 2015    Dr. Jamal Collin, Dr. Olevia Perches  . Eye surgery Bilateral 2012    cataract  . Upper gi endoscopy    . Cataract extraction      both eyes  . Breast cyst aspiration Right 1990's    benign  . Cystoscopy with ureteroscopy and stent placement Left 10/15/2015    Procedure: CYSTOSCOPY WITH URETEROSCOPY AND STENT PLACEMENT;  Surgeon: Cleon Gustin, MD;  Location: [facility] ORS;  Service: Urology;  Laterality: Left;    Home Medications:    Medication List       This list is accurate as of: 10/25/15 10:17 AM.  Always use your most recent med list.               aspirin EC 325 MG tablet  Take 325 mg by mouth daily.     furosemide 40 MG tablet  Commonly known as:  LASIX  Take 40 mg by mouth daily.     ibuprofen 800 MG tablet  Commonly known as:  ADVIL,MOTRIN  Take 1 tablet (800 mg total) by mouth every 8 (  eight) hours as needed.     losartan 50 MG tablet  Commonly known as:  COZAAR  Take 50 mg by mouth daily.     metFORMIN 850 MG tablet  Commonly known as:  GLUCOPHAGE  Take 850 mg by mouth 2 (two) times daily with a meal.     omeprazole 40 MG capsule  Commonly known as:  PRILOSEC  Take 40 mg by mouth daily.     oxybutynin 5 MG tablet  Commonly known as:  DITROPAN  Take 1 tablet (5 mg total) by mouth 3 (three) times daily.     oxyCODONE-acetaminophen 5-325 MG tablet  Commonly known as:  PERCOCET/ROXICET  Take 1 tablet by mouth every 6 (six) hours as needed for moderate pain or severe pain.     phenazopyridine 100 MG tablet  Commonly known as:  PYRIDIUM  Take 1 tablet (100 mg total) by mouth 3 (three) times daily with meals.     potassium chloride 10 MEQ tablet  Commonly known as:  K-DUR,KLOR-CON  Take 10 mEq by mouth daily.     promethazine 12.5 MG tablet  Commonly known as:  PHENERGAN  Take 1 tablet (12.5 mg total) by mouth every 8 (eight) hours as  needed for nausea or vomiting.     simvastatin 40 MG tablet  Commonly known as:  ZOCOR  Take 40 mg by mouth every evening.     tamsulosin 0.4 MG Caps capsule  Commonly known as:  FLOMAX  Take 1 capsule (0.4 mg total) by mouth daily.        Allergies: No Known Allergies  Family History: Family History  Problem Relation Age of Onset  . Autoimmune disease Daughter   . Thyroid disease Daughter   . Thyroid disease Sister   . Lung cancer Father   . Heart disease Father   . Diverticulosis Mother   . Diabetes Mother   . Stroke Mother 57  . Stomach cancer Maternal Aunt   . Breast cancer Maternal Aunt   . Colon cancer Neg Hx   . Esophageal cancer Neg Hx   . Rectal cancer Neg Hx     Social History:  reports that she quit smoking about 17 years ago. Her smoking use included Cigarettes. She has never used smokeless tobacco. She reports that she drinks alcohol. She reports that she does not use illicit drugs.  ROS: UROLOGY Frequent Urination?: Yes Hard to postpone urination?: Yes Burning/pain with urination?: Yes Get up at night to urinate?: Yes Leakage of urine?: Yes Urine stream starts and stops?: Yes Trouble starting stream?: No Do you have to strain to urinate?: No Blood in urine?: No Urinary tract infection?: No Sexually transmitted disease?: No Injury to kidneys or bladder?: No Painful intercourse?: No Weak stream?: No Currently pregnant?: No Vaginal bleeding?: No Last menstrual period?: N  Gastrointestinal Nausea?: Yes Vomiting?: No Indigestion/heartburn?: No Diarrhea?: Yes Constipation?: No  Constitutional Fever: No Night sweats?: Yes Weight loss?: No Fatigue?: Yes  Skin Skin rash/lesions?: No Itching?: No  Eyes Blurred vision?: No Double vision?: No  Ears/Nose/Throat Sore throat?: No Sinus problems?: Yes  Hematologic/Lymphatic Swollen glands?: No Easy bruising?: No  Cardiovascular Leg swelling?: No Chest pain?:  No  Respiratory Cough?: No Shortness of breath?: No  Endocrine Excessive thirst?: Yes  Musculoskeletal Back pain?: No Joint pain?: No  Neurological Headaches?: No Dizziness?: No  Psychologic Depression?: No Anxiety?: No  Physical Exam: BP 122/75 mmHg  Pulse 67  Ht 5' 3"  (1.6 m)  Wt  Constitutional:  Alert and oriented, No acute distress. HEENT: [place] AT, moist mucus membranes.  Trachea midline, no masses. Cardiovascular: No clubbing, cyanosis, or edema. Respiratory: Normal respiratory effort, no increased work of breathing. GI: Abdomen is soft, nontender, nondistended, no abdominal masses GU: No CVA tenderness.  Skin: No rashes, bruises or suspicious lesions. Lymph: No cervical or inguinal adenopathy. Neurologic: Grossly intact, no focal deficits, moving all 4 extremities. Psychiatric: Normal mood and affect.  Laboratory Data: Lab Results  Component Value Date   WBC 6.0 10/18/2015   HGB 11.5* 10/18/2015   HCT 34.2* 10/18/2015   MCV 83.4 10/18/2015   PLT 103* 10/18/2015    Lab Results  Component Value Date   CREATININE 0.69 10/18/2015    No results found for: PSA  No results found for: TESTOSTERONE  Lab Results  Component Value Date   HGBA1C 5.7 01/24/2015    Urinalysis    Component Value Date/Time   COLORURINE Orange* 10/23/2015 1651   APPEARANCEUR CLEAR 10/23/2015 1651   LABSPEC 1.015 10/23/2015 1651   PHURINE 5.5 10/23/2015 1651   GLUCOSEU 100* 10/23/2015 1651   GLUCOSEU NEGATIVE 10/15/2015 1444   HGBUR MODERATE* 10/23/2015 1651   BILIRUBINUR Negative 10/23/2015 1705   BILIRUBINUR NEGATIVE 10/23/2015 1651   BILIRUBINUR negative 08/26/2015 1130   KETONESUR NEGATIVE 10/23/2015 1651   KETONESUR negative 08/26/2015 1130   PROTEINUR 30 10/23/2015 1705   PROTEINUR NEGATIVE 10/15/2015 1444   PROTEINUR negative 08/26/2015 1130   UROBILINOGEN 1.0 10/23/2015 1705   UROBILINOGEN 2.0* 10/23/2015 1651   NITRITE Positive 10/23/2015 1705   NITRITE  POSITIVE* 10/23/2015 1651   NITRITE Negative 08/26/2015 1130   LEUKOCYTESUR Trace* 10/23/2015 1705    Pertinent Imaging: CT stone study  Assessment & Plan:   1. Nephrolithiasis -risks/benefits/alternatives to left URS was explained to the patient and she understands and wishes to proceed with surgery.  There are no diagnoses linked to this encounter.  No Follow-up on file.  Nicolette Bang, MD  [facility] [address], [place], [place] [address] [phone]

## 2015-10-25 NOTE — Telephone Encounter (Signed)
Notified pt of surgery scheduled 11/12/15, pre-admit testing appt on 10/29/15 @10 :45 & call day prior to surgery for arrival time to SDS. Pt voices understanding.

## 2015-10-26 ENCOUNTER — Encounter: Payer: Self-pay | Admitting: Internal Medicine

## 2015-10-26 DIAGNOSIS — Z09 Encounter for follow-up examination after completed treatment for conditions other than malignant neoplasm: Secondary | ICD-10-CM | POA: Insufficient documentation

## 2015-10-26 MED ORDER — AMOXICILLIN-POT CLAVULANATE 875-125 MG PO TABS: 1.0000 | ORAL_TABLET | Freq: Two times a day (BID) | ORAL | Status: DC

## 2015-10-26 NOTE — Assessment & Plan Note (Addendum)
Historically  well-controlled on current medications .  LAST hemoglobin A1c WAS 5.7 . Patient is up to date on  annual eye exam  And has no history of  Microalbuminuria. She is  tolerating an  ACE/ARB for renal protection and hypertension . She is overdue for fasting labs . Hospital BS reviewed and ranged form 133 to 200   Lab Results  Component Value Date   HGBA1C 5.7 01/24/2015   Lab Results  Component Value Date   MICROALBUR 3.4* 01/24/2015

## 2015-10-26 NOTE — Assessment & Plan Note (Addendum)
She was treated initially with Rocephin and transitioned to Ciprofloxacin,  She continues to have suprapubic discomfort.  Repeat UA shows persistent inflammation , 3-6 WBCs, (previously TNTC) and culture grew E coli 75K colonies , resistant to Cipro but sensitive to Macrobid , Augmentin and fosfomycin.  Will recommend repeating a round of antibiotics with augmentin

## 2015-10-26 NOTE — Assessment & Plan Note (Signed)
All labs , imaging studies and progress notes from admission were reviewed with patient today

## 2015-10-28 DIAGNOSIS — N200 Calculus of kidney: Secondary | ICD-10-CM | POA: Insufficient documentation

## 2015-10-29 ENCOUNTER — Telehealth: Payer: Self-pay | Admitting: *Deleted

## 2015-10-29 ENCOUNTER — Encounter
Admission: RE | Admit: 2015-10-29 | Discharge: 2015-10-29 | Disposition: A | Discharge status: Home / Self Care | Payer: BLUE CROSS/BLUE SHIELD | Source: Ambulatory Visit | Attending: Urology | Admitting: Urology

## 2015-10-29 DIAGNOSIS — Z01810 Encounter for preprocedural cardiovascular examination: Secondary | ICD-10-CM | POA: Insufficient documentation

## 2015-10-29 NOTE — Pre-Procedure Instructions (Addendum)
Received a call from [address] Etta Quill nurse, Dr. Clayborn Bigness reviewed EKG, no change in EKG, OK to proceed.

## 2015-10-29 NOTE — Telephone Encounter (Signed)
Advised pt to hold ASA 325 mg 7 days prior to surgery per Dr Clayborn Bigness & RTC for repeat ucx on 11/06/15. Pt voices understanding.

## 2015-10-29 NOTE — Patient Instructions (Signed)
  Your procedure is scheduled [id number] July 11 , 2017. Report to Same Day Surgery. To find out your arrival time please call [phone] between 1PM - 3PM on Monday November 11, 2015.  Remember: Instructions that are not followed completely may result in serious medical risk, up to and including death, or upon the discretion of your surgeon and anesthesiologist your surgery may need to be rescheduled.    _x___ 1. Do not eat food or drink liquids after midnight. No gum chewing or hard candies.     _x___ 2. No Alcohol for 24 hours before or after surgery.   ____ 3. Bring all medications with you on the day of surgery if instructed.    __x__ 4. Notify your doctor if there is any change in your medical condition     (cold, fever, infections).     Do not wear jewelry, make-up, hairpins, clips or nail polish.  Do not wear lotions, powders, or perfumes.   Do not shave 48 hours prior to surgery. Men may shave face and neck.  Do not bring valuables to the hospital.    [facility] is not responsible for any belongings or valuables.               Contacts, dentures or bridgework may not be worn into surgery.  Leave your suitcase in the car. After surgery it may be brought to your room.  For patients admitted to the hospital, discharge time is determined by your treatment team.   Patients discharged the day of surgery will not be allowed to drive home.    Please read over the following fact sheets that you were given:   [facility] Preparing for Surgery  _x___ Take these medicines the morning of surgery with A SIP OF WATER:    1. losartan (COZAAR)  2. omeprazole (PRILOSEC) take an extra dose at bedtime the night prior to surgery.  3. tamsulosin (FLOMAX)    ____ Fleet Enema (as directed)   ____ Use CHG Soap as directed on instruction sheet  ____ Use inhalers on the day of surgery and bring to hospital day of surgery  __x__ Stop metformin 2 days prior to surgery    ____ Take 1/2 of  usual insulin dose the night before surgery and none on the morning of surgery.   __x__ Stop aspirin on November 05, 2015 per instructions by Dr. Clayborn Bigness.  __x__ Stop Anti-inflammatories such as Advil, Aleve, Ibuprofen, Motrin, Naproxen, Naprosyn, Goodies powders or aspirin products. OK to take Tylenol.   ____ Stop supplements until after surgery.    ____ Bring C-Pap to the hospital.

## 2015-10-29 NOTE — Telephone Encounter (Signed)
FYI Dr. Clayborn Bigness viewed the EKG for this patient and it was ok, the EKG that was faxed over can be discarded.

## 2015-10-29 NOTE — Telephone Encounter (Signed)
FYi, see below, thanks

## 2015-10-29 NOTE — Pre-Procedure Instructions (Signed)
Noted that pt has already received a clearance from her cardiologist Dr. Clayborn Bigness, faxed today's EKG to Dr. Clayborn Bigness to review for upcoming surgery.

## 2015-10-29 NOTE — Pre-Procedure Instructions (Signed)
Faxed medical clearance to Dr. Derrel Nip regarding today's EKG. Message left at Dr. Lupita Dawn office regarding medical clearance being faxed and to call me back if they did not receive it.

## 2015-10-29 NOTE — Pre-Procedure Instructions (Addendum)
Spoke with Dr. Kayleen Memos regarding today's EKG, Sinus rhythm with marked sinus arrhythmia, possible inferior infarct(cited on or before 14/14/2000) Possible anterolateral infarct, age undetermined. Dr. Kayleen Memos wants the EKG sent to PCP for review/clearance.

## 2015-10-29 NOTE — Pre-Procedure Instructions (Signed)
Spoke with Mardene Celeste at Dr. Etta Quill office and they did receive the fax regarding pt's EKG from today's visit to review.

## 2015-11-01 ENCOUNTER — Other Ambulatory Visit: Payer: Self-pay

## 2015-11-01 DIAGNOSIS — N200 Calculus of kidney: Secondary | ICD-10-CM

## 2015-11-04 NOTE — Pre-Procedure Instructions (Signed)
CLEARED LOW RISK BY DR Derrel Nip 10/30/15

## 2015-11-05 MED ORDER — BUPIVACAINE-EPINEPHRINE (PF) 0.25% -1:200000 IJ SOLN
INTRAMUSCULAR | Status: AC
  Filled 2015-11-05: qty 30

## 2015-11-06 ENCOUNTER — Other Ambulatory Visit: Payer: Medicare Other

## 2015-11-06 ENCOUNTER — Telehealth: Payer: Self-pay

## 2015-11-06 DIAGNOSIS — N200 Calculus of kidney: Secondary | ICD-10-CM

## 2015-11-06 LAB — URINALYSIS, COMPLETE
Bilirubin, UA: NEGATIVE
Glucose, UA: NEGATIVE
Ketones, UA: NEGATIVE
Nitrite, UA: POSITIVE — AB
Protein, UA: NEGATIVE
Specific Gravity, UA: 1.01 (ref 1.005–1.030)
Urobilinogen, Ur: 0.2 mg/dL (ref 0.2–1.0)
pH, UA: 5 (ref 5.0–7.5)

## 2015-11-06 LAB — MICROSCOPIC EXAMINATION

## 2015-11-06 NOTE — Telephone Encounter (Signed)
Per Dr. Alyson Ingles patient is ok, no abx needed prior to surgery

## 2015-11-06 NOTE — Telephone Encounter (Signed)
Patient dropped of UA for pre-op culture today and results show it is nitrate positive. Do you want to start on abx? Surgery is on 11-12-15 and we won't get culture results back until Monday the day before Please advise  Thanks

## 2015-11-08 ENCOUNTER — Other Ambulatory Visit: Payer: Self-pay | Admitting: Internal Medicine

## 2015-11-08 ENCOUNTER — Ambulatory Visit: Payer: Self-pay

## 2015-11-08 LAB — CULTURE, URINE COMPREHENSIVE

## 2015-11-08 NOTE — Telephone Encounter (Signed)
This is a historical medication. Would you like her to continue taking? Please advise?

## 2015-11-08 NOTE — Telephone Encounter (Signed)
REFILLED. PATIENT NEEDS FASTING LABS AND A1C  SHE IS OVERDUE LABS ORDERED  Lab Results  Component Value Date   HGBA1C 5.7 01/24/2015

## 2015-11-11 NOTE — Telephone Encounter (Signed)
Patient will call back ans schedule with have outpatient surgery for her kidney stone.

## 2015-11-12 ENCOUNTER — Ambulatory Visit: Payer: BLUE CROSS/BLUE SHIELD | Admitting: Anesthesiology

## 2015-11-12 ENCOUNTER — Ambulatory Visit
Admission: RE | Admit: 2015-11-12 | Discharge: 2015-11-12 | Disposition: A | Discharge status: Home / Self Care | Payer: BLUE CROSS/BLUE SHIELD | Source: Ambulatory Visit | Attending: Urology | Admitting: Urology

## 2015-11-12 ENCOUNTER — Encounter: Payer: Self-pay | Admitting: *Deleted

## 2015-11-12 ENCOUNTER — Encounter
Admission: RE | Disposition: A | Discharge status: Home / Self Care | Payer: Self-pay | Source: Ambulatory Visit | Attending: Urology

## 2015-11-12 DIAGNOSIS — I2510 Atherosclerotic heart disease of native coronary artery without angina pectoris: Secondary | ICD-10-CM | POA: Insufficient documentation

## 2015-11-12 DIAGNOSIS — I10 Essential (primary) hypertension: Secondary | ICD-10-CM | POA: Diagnosis not present

## 2015-11-12 DIAGNOSIS — Z79899 Other long term (current) drug therapy: Secondary | ICD-10-CM | POA: Diagnosis not present

## 2015-11-12 DIAGNOSIS — I252 Old myocardial infarction: Secondary | ICD-10-CM | POA: Insufficient documentation

## 2015-11-12 DIAGNOSIS — Z801 Family history of malignant neoplasm of trachea, bronchus and lung: Secondary | ICD-10-CM | POA: Diagnosis not present

## 2015-11-12 DIAGNOSIS — K219 Gastro-esophageal reflux disease without esophagitis: Secondary | ICD-10-CM | POA: Diagnosis not present

## 2015-11-12 DIAGNOSIS — Z8269 Family history of other diseases of the musculoskeletal system and connective tissue: Secondary | ICD-10-CM | POA: Diagnosis not present

## 2015-11-12 DIAGNOSIS — Z87891 Personal history of nicotine dependence: Secondary | ICD-10-CM | POA: Diagnosis not present

## 2015-11-12 DIAGNOSIS — Z8249 Family history of ischemic heart disease and other diseases of the circulatory system: Secondary | ICD-10-CM | POA: Insufficient documentation

## 2015-11-12 DIAGNOSIS — Z6833 Body mass index (BMI) 33.0-33.9, adult: Secondary | ICD-10-CM | POA: Diagnosis not present

## 2015-11-12 DIAGNOSIS — M1990 Unspecified osteoarthritis, unspecified site: Secondary | ICD-10-CM | POA: Insufficient documentation

## 2015-11-12 DIAGNOSIS — Z791 Long term (current) use of non-steroidal anti-inflammatories (NSAID): Secondary | ICD-10-CM | POA: Diagnosis not present

## 2015-11-12 DIAGNOSIS — E785 Hyperlipidemia, unspecified: Secondary | ICD-10-CM | POA: Diagnosis not present

## 2015-11-12 DIAGNOSIS — Z79891 Long term (current) use of opiate analgesic: Secondary | ICD-10-CM | POA: Insufficient documentation

## 2015-11-12 DIAGNOSIS — N201 Calculus of ureter: Secondary | ICD-10-CM

## 2015-11-12 DIAGNOSIS — Z955 Presence of coronary angioplasty implant and graft: Secondary | ICD-10-CM | POA: Diagnosis not present

## 2015-11-12 DIAGNOSIS — E669 Obesity, unspecified: Secondary | ICD-10-CM | POA: Insufficient documentation

## 2015-11-12 DIAGNOSIS — Z803 Family history of malignant neoplasm of breast: Secondary | ICD-10-CM | POA: Diagnosis not present

## 2015-11-12 DIAGNOSIS — Z87440 Personal history of urinary (tract) infections: Secondary | ICD-10-CM | POA: Insufficient documentation

## 2015-11-12 DIAGNOSIS — K644 Residual hemorrhoidal skin tags: Secondary | ICD-10-CM | POA: Diagnosis not present

## 2015-11-12 DIAGNOSIS — Z7984 Long term (current) use of oral hypoglycemic drugs: Secondary | ICD-10-CM | POA: Diagnosis not present

## 2015-11-12 DIAGNOSIS — E119 Type 2 diabetes mellitus without complications: Secondary | ICD-10-CM | POA: Diagnosis not present

## 2015-11-12 DIAGNOSIS — Z87442 Personal history of urinary calculi: Secondary | ICD-10-CM | POA: Insufficient documentation

## 2015-11-12 DIAGNOSIS — Z800 Family history of malignant neoplasm of digestive organs: Secondary | ICD-10-CM | POA: Insufficient documentation

## 2015-11-12 DIAGNOSIS — N202 Calculus of kidney with calculus of ureter: Secondary | ICD-10-CM | POA: Diagnosis present

## 2015-11-12 DIAGNOSIS — Z7982 Long term (current) use of aspirin: Secondary | ICD-10-CM | POA: Diagnosis not present

## 2015-11-12 DIAGNOSIS — N200 Calculus of kidney: Secondary | ICD-10-CM

## 2015-11-12 DIAGNOSIS — Z8349 Family history of other endocrine, nutritional and metabolic diseases: Secondary | ICD-10-CM | POA: Insufficient documentation

## 2015-11-12 DIAGNOSIS — Z823 Family history of stroke: Secondary | ICD-10-CM | POA: Diagnosis not present

## 2015-11-12 HISTORY — PX: URETEROSCOPY WITH HOLMIUM LASER LITHOTRIPSY: SHX6645

## 2015-11-12 HISTORY — PX: CYSTOSCOPY W/ URETERAL STENT PLACEMENT: SHX1429

## 2015-11-12 HISTORY — PX: CYSTOSCOPY W/ RETROGRADES: SHX1426

## 2015-11-12 LAB — GLUCOSE, CAPILLARY
Glucose-Capillary: 168 mg/dL — ABNORMAL HIGH (ref 65–99)
Glucose-Capillary: 161 mg/dL — ABNORMAL HIGH (ref 65–99)

## 2015-11-12 SURGERY — URETEROSCOPY, WITH LITHOTRIPSY USING HOLMIUM LASER
Anesthesia: General | Laterality: Left | Wound class: Clean Contaminated

## 2015-11-12 MED ORDER — ONDANSETRON HCL 4 MG/2ML IJ SOLN
INTRAMUSCULAR | Status: DC | PRN
  Administered 2015-11-12: 4 mg via INTRAVENOUS

## 2015-11-12 MED ORDER — EPINEPHRINE HCL 0.1 MG/ML IJ SOSY
PREFILLED_SYRINGE | INTRAMUSCULAR | Status: DC | PRN
  Administered 2015-11-12: 10 ug via INTRAVENOUS

## 2015-11-12 MED ORDER — OXYCODONE-ACETAMINOPHEN 5-325 MG PO TABS: 1.0000 | ORAL_TABLET | Freq: Four times a day (QID) | ORAL | Status: DC | PRN

## 2015-11-12 MED ORDER — ONDANSETRON HCL 4 MG/2ML IJ SOLN: 4.0000 mg | Freq: Once | INTRAMUSCULAR | Status: DC | PRN

## 2015-11-12 MED ORDER — DEXAMETHASONE SODIUM PHOSPHATE 4 MG/ML IJ SOLN
INTRAMUSCULAR | Status: DC | PRN
  Administered 2015-11-12: 5 mg via INTRAVENOUS

## 2015-11-12 MED ORDER — SODIUM CHLORIDE 0.9 % IV SOLN
INTRAVENOUS | Status: DC
  Administered 2015-11-12: 07:00:00 via INTRAVENOUS

## 2015-11-12 MED ORDER — LIDOCAINE HCL (PF) 4 % IJ SOLN
INTRAMUSCULAR | Status: DC | PRN
  Administered 2015-11-12: 4 mL via RESPIRATORY_TRACT

## 2015-11-12 MED ORDER — IOTHALAMATE MEGLUMINE 43 % IV SOLN
INTRAVENOUS | Status: DC | PRN
  Administered 2015-11-12: 15 mL

## 2015-11-12 MED ORDER — SULFAMETHOXAZOLE-TRIMETHOPRIM 800-160 MG PO TABS: 1.0000 | ORAL_TABLET | Freq: Two times a day (BID) | ORAL | Status: DC

## 2015-11-12 MED ORDER — NEOSTIGMINE METHYLSULFATE 10 MG/10ML IV SOLN
INTRAVENOUS | Status: DC | PRN
  Administered 2015-11-12: 1.5 mg via INTRAVENOUS

## 2015-11-12 MED ORDER — FENTANYL CITRATE (PF) 100 MCG/2ML IJ SOLN
INTRAMUSCULAR | Status: DC | PRN
  Administered 2015-11-12: 100 ug via INTRAVENOUS

## 2015-11-12 MED ORDER — LIDOCAINE HCL (CARDIAC) 20 MG/ML IV SOLN
INTRAVENOUS | Status: DC | PRN
  Administered 2015-11-12: 40 mg via INTRAVENOUS

## 2015-11-12 MED ORDER — FENTANYL CITRATE (PF) 100 MCG/2ML IJ SOLN: 25.0000 ug | INTRAMUSCULAR | Status: DC | PRN

## 2015-11-12 MED ORDER — PHENYLEPHRINE HCL 10 MG/ML IJ SOLN
INTRAMUSCULAR | Status: DC | PRN
  Administered 2015-11-12: 100 ug via INTRAVENOUS
  Administered 2015-11-12 (×3): 200 ug via INTRAVENOUS

## 2015-11-12 MED ORDER — PROPOFOL 10 MG/ML IV BOLUS
INTRAVENOUS | Status: DC | PRN
  Administered 2015-11-12: 150 mg via INTRAVENOUS

## 2015-11-12 MED ORDER — GLYCOPYRROLATE 0.2 MG/ML IJ SOLN
INTRAMUSCULAR | Status: DC | PRN
  Administered 2015-11-12: 0.2 mg via INTRAVENOUS

## 2015-11-12 MED ORDER — OXYBUTYNIN CHLORIDE 5 MG PO TABS: 5.0000 mg | ORAL_TABLET | Freq: Three times a day (TID) | ORAL | Status: DC

## 2015-11-12 MED ORDER — ROCURONIUM BROMIDE 100 MG/10ML IV SOLN
INTRAVENOUS | Status: DC | PRN
  Administered 2015-11-12: 40 mg via INTRAVENOUS

## 2015-11-12 MED ORDER — DEXTROSE 5 % IV SOLN
2.0000 g | INTRAVENOUS | Status: DC
  Administered 2015-11-12: 2 g via INTRAVENOUS
  Filled 2015-11-12 (×2): qty 2

## 2015-11-12 SURGICAL SUPPLY — 31 items
ADAPTER SCOPE UROLOK II (MISCELLANEOUS) ×2 IMPLANT
BAG DRAIN CYSTO-URO LG1000N (MISCELLANEOUS) ×2 IMPLANT
BASKET ZERO TIP 1.9FR (BASKET) ×4 IMPLANT
CATH URETL 5X70 OPEN END (CATHETERS) IMPLANT
CNTNR SPEC 2.5X3XGRAD LEK (MISCELLANEOUS) ×1
CONRAY 43 FOR UROLOGY 50M (MISCELLANEOUS) ×2 IMPLANT
CONT SPEC 4OZ STER OR WHT (MISCELLANEOUS) ×1
CONTAINER SPEC 2.5X3XGRAD LEK (MISCELLANEOUS) ×1 IMPLANT
GLOVE BIO SURGEON STRL SZ7 (GLOVE) ×2 IMPLANT
GLOVE BIO SURGEON STRL SZ8 (GLOVE) ×4 IMPLANT
GOWN STRL REUS W/ TWL LRG LVL3 (GOWN DISPOSABLE) ×1 IMPLANT
GOWN STRL REUS W/ TWL LRG LVL4 (GOWN DISPOSABLE) ×2 IMPLANT
GOWN STRL REUS W/TWL LRG LVL3 (GOWN DISPOSABLE) ×1
GOWN STRL REUS W/TWL LRG LVL4 (GOWN DISPOSABLE) ×2
GUIDEWIRE STR ZIPWIRE 035X150 (MISCELLANEOUS) ×2 IMPLANT
INTRODUCER DILATOR DOUBLE (INTRODUCER) IMPLANT
KIT RM TURNOVER CYSTO AR (KITS) ×2 IMPLANT
PACK CYSTO AR (MISCELLANEOUS) ×2 IMPLANT
PREP PVP WINGED SPONGE (MISCELLANEOUS) ×2 IMPLANT
PUMP SINGLE ACTION SAP (PUMP) IMPLANT
SENSORWIRE 0.038 NOT ANGLED (WIRE)
SET CYSTO W/LG BORE CLAMP LF (SET/KITS/TRAYS/PACK) ×2 IMPLANT
SHEATH URETERAL 12FRX35CM (MISCELLANEOUS) ×2 IMPLANT
SOL .9 NS 3000ML IRR  AL (IV SOLUTION) ×1
SOL .9 NS 3000ML IRR UROMATIC (IV SOLUTION) ×1 IMPLANT
STENT URET 6FRX24 CONTOUR (STENTS) IMPLANT
STENT URET 6FRX26 CONTOUR (STENTS) ×2 IMPLANT
SURGILUBE 2OZ TUBE FLIPTOP (MISCELLANEOUS) ×2 IMPLANT
SYRINGE IRR TOOMEY STRL 70CC (SYRINGE) ×2 IMPLANT
WATER STERILE IRR 1000ML POUR (IV SOLUTION) ×2 IMPLANT
WIRE SENSOR 0.038 NOT ANGLED (WIRE) IMPLANT

## 2015-11-12 NOTE — Anesthesia Procedure Notes (Signed)
Procedure Name: Intubation Date/Time: 11/12/2015 7:46 AM Performed by: Rosaria Ferries, Greyson Peavy Pre-anesthesia Checklist: Patient identified, Emergency Drugs available, Suction available and Patient being monitored Patient Re-evaluated:Patient Re-evaluated prior to inductionOxygen Delivery Method: Circle system utilized Preoxygenation: Pre-oxygenation with 100% oxygen Intubation Type: IV induction Laryngoscope Size: Mac and 3 Grade View: Grade I Tube type: Oral Tube size: 7.0 mm Number of attempts: 1 Airway Equipment and Method: LTA kit utilized Placement Confirmation: ETT inserted through vocal cords under direct vision,  positive ETCO2 and breath sounds checked- equal and bilateral Secured at: 21 cm Tube secured with: Tape Dental Injury: Teeth and Oropharynx as per pre-operative assessment

## 2015-11-12 NOTE — H&P (View-Only) (Signed)
10/25/2015 10:17 AM   Kimberly Huff 08/15/1945 [id number]  Referring provider: [name], MD [place], [place] [address]  Chief Complaint  Patient presents with  . New Patient (Initial Visit)    Renal Stones    HPI: Kimberly Huff is a 70yo here for followup for nephrolithiasis. She is s/p L ureteral stent placemen ton 10/15/2015 for L ureteral calculus and sepsis.  This is her first stone event. She has mild discomfort from the stent. She denies flank pain. Urine culture showed multiple species. She just finished course of cipro.    PMH: Past Medical History  Diagnosis Date  . Kidney stone   . Diverticulosis 2005    Dr Jamal Collin  . Coronary artery disease   . Hypertension   . Hyperlipidemia   . Hypopotassemia   . Atrophic kidney     right  . Osteoarthritis   . Coronary atherosclerosis   . Obesity   . Myocardial infarction ([place]) 2000  . Recurrent UTI   . Steatohepatitis 05/30/10    Brunt Stage 3  . Allergy   . Blood transfusion   . Cataract   . Neuromuscular disorder ([place])     "achy muscles"  . External hemorrhoids without mention of complication 2641  . Unspecified essential hypertension 2014  . Heart murmur 2012    found by Dr. Jamal Collin  . H/O cardiac catheterization 2005  . Spinal stenosis of cervical region   . Blood transfusion without reported diagnosis   . Clotting disorder ([facility])     blood clots in abdomen after surgery  . GERD (gastroesophageal reflux disease)   . Ulcer   . Type II or unspecified type diabetes mellitus without mention of complication, uncontrolled     Patient does takes Metformin and recommended to stop for 48 hours.  . Diabetes mellitus     Surgical History: Past Surgical History  Procedure Laterality Date  . Breast mass excision      benign  . Ureterotomy  1960/1969    x 2 during childhood  . Abdominal hysterectomy  1982    complete  . Ptca  2000  . Vaginal hysterectomy      PT DENIES ABD HYSTERECTOMY    . Foot surgery  2008  . Cardiac stents  2000  . Tonsillectomy    . Salpingoophorectomy    . Colonoscopy  2004, 2015    Dr. Jamal Collin, Dr. Olevia Perches  . Eye surgery Bilateral 2012    cataract  . Upper gi endoscopy    . Cataract extraction      both eyes  . Breast cyst aspiration Right 1990's    benign  . Cystoscopy with ureteroscopy and stent placement Left 10/15/2015    Procedure: CYSTOSCOPY WITH URETEROSCOPY AND STENT PLACEMENT;  Surgeon: Cleon Gustin, MD;  Location: [facility] ORS;  Service: Urology;  Laterality: Left;    Home Medications:    Medication List       This list is accurate as of: 10/25/15 10:17 AM.  Always use your most recent med list.               aspirin EC 325 MG tablet  Take 325 mg by mouth daily.     furosemide 40 MG tablet  Commonly known as:  LASIX  Take 40 mg by mouth daily.     ibuprofen 800 MG tablet  Commonly known as:  ADVIL,MOTRIN  Take 1 tablet (800 mg total) by mouth every 8 (  eight) hours as needed.     losartan 50 MG tablet  Commonly known as:  COZAAR  Take 50 mg by mouth daily.     metFORMIN 850 MG tablet  Commonly known as:  GLUCOPHAGE  Take 850 mg by mouth 2 (two) times daily with a meal.     omeprazole 40 MG capsule  Commonly known as:  PRILOSEC  Take 40 mg by mouth daily.     oxybutynin 5 MG tablet  Commonly known as:  DITROPAN  Take 1 tablet (5 mg total) by mouth 3 (three) times daily.     oxyCODONE-acetaminophen 5-325 MG tablet  Commonly known as:  PERCOCET/ROXICET  Take 1 tablet by mouth every 6 (six) hours as needed for moderate pain or severe pain.     phenazopyridine 100 MG tablet  Commonly known as:  PYRIDIUM  Take 1 tablet (100 mg total) by mouth 3 (three) times daily with meals.     potassium chloride 10 MEQ tablet  Commonly known as:  K-DUR,KLOR-CON  Take 10 mEq by mouth daily.     promethazine 12.5 MG tablet  Commonly known as:  PHENERGAN  Take 1 tablet (12.5 mg total) by mouth every 8 (eight) hours as  needed for nausea or vomiting.     simvastatin 40 MG tablet  Commonly known as:  ZOCOR  Take 40 mg by mouth every evening.     tamsulosin 0.4 MG Caps capsule  Commonly known as:  FLOMAX  Take 1 capsule (0.4 mg total) by mouth daily.        Allergies: No Known Allergies  Family History: Family History  Problem Relation Age of Onset  . Autoimmune disease Daughter   . Thyroid disease Daughter   . Thyroid disease Sister   . Lung cancer Father   . Heart disease Father   . Diverticulosis Mother   . Diabetes Mother   . Stroke Mother 74  . Stomach cancer Maternal Aunt   . Breast cancer Maternal Aunt   . Colon cancer Neg Hx   . Esophageal cancer Neg Hx   . Rectal cancer Neg Hx     Social History:  reports that she quit smoking about 17 years ago. Her smoking use included Cigarettes. She has never used smokeless tobacco. She reports that she drinks alcohol. She reports that she does not use illicit drugs.  ROS: UROLOGY Frequent Urination?: Yes Hard to postpone urination?: Yes Burning/pain with urination?: Yes Get up at night to urinate?: Yes Leakage of urine?: Yes Urine stream starts and stops?: Yes Trouble starting stream?: No Do you have to strain to urinate?: No Blood in urine?: No Urinary tract infection?: No Sexually transmitted disease?: No Injury to kidneys or bladder?: No Painful intercourse?: No Weak stream?: No Currently pregnant?: No Vaginal bleeding?: No Last menstrual period?: N  Gastrointestinal Nausea?: Yes Vomiting?: No Indigestion/heartburn?: No Diarrhea?: Yes Constipation?: No  Constitutional Fever: No Night sweats?: Yes Weight loss?: No Fatigue?: Yes  Skin Skin rash/lesions?: No Itching?: No  Eyes Blurred vision?: No Double vision?: No  Ears/Nose/Throat Sore throat?: No Sinus problems?: Yes  Hematologic/Lymphatic Swollen glands?: No Easy bruising?: No  Cardiovascular Leg swelling?: No Chest pain?:  No  Respiratory Cough?: No Shortness of breath?: No  Endocrine Excessive thirst?: Yes  Musculoskeletal Back pain?: No Joint pain?: No  Neurological Headaches?: No Dizziness?: No  Psychologic Depression?: No Anxiety?: No  Physical Exam: BP 122/75 mmHg  Pulse 67  Ht 5' 3"  (1.6 m)  Wt  Constitutional:  Alert and oriented, No acute distress. HEENT: [place] AT, moist mucus membranes.  Trachea midline, no masses. Cardiovascular: No clubbing, cyanosis, or edema. Respiratory: Normal respiratory effort, no increased work of breathing. GI: Abdomen is soft, nontender, nondistended, no abdominal masses GU: No CVA tenderness.  Skin: No rashes, bruises or suspicious lesions. Lymph: No cervical or inguinal adenopathy. Neurologic: Grossly intact, no focal deficits, moving all 4 extremities. Psychiatric: Normal mood and affect.  Laboratory Data: Lab Results  Component Value Date   WBC 6.0 10/18/2015   HGB 11.5* 10/18/2015   HCT 34.2* 10/18/2015   MCV 83.4 10/18/2015   PLT 103* 10/18/2015    Lab Results  Component Value Date   CREATININE 0.69 10/18/2015    No results found for: PSA  No results found for: TESTOSTERONE  Lab Results  Component Value Date   HGBA1C 5.7 01/24/2015    Urinalysis    Component Value Date/Time   COLORURINE Orange* 10/23/2015 1651   APPEARANCEUR CLEAR 10/23/2015 1651   LABSPEC 1.015 10/23/2015 1651   PHURINE 5.5 10/23/2015 1651   GLUCOSEU 100* 10/23/2015 1651   GLUCOSEU NEGATIVE 10/15/2015 1444   HGBUR MODERATE* 10/23/2015 1651   BILIRUBINUR Negative 10/23/2015 1705   BILIRUBINUR NEGATIVE 10/23/2015 1651   BILIRUBINUR negative 08/26/2015 1130   KETONESUR NEGATIVE 10/23/2015 1651   KETONESUR negative 08/26/2015 1130   PROTEINUR 30 10/23/2015 1705   PROTEINUR NEGATIVE 10/15/2015 1444   PROTEINUR negative 08/26/2015 1130   UROBILINOGEN 1.0 10/23/2015 1705   UROBILINOGEN 2.0* 10/23/2015 1651   NITRITE Positive 10/23/2015 1705   NITRITE  POSITIVE* 10/23/2015 1651   NITRITE Negative 08/26/2015 1130   LEUKOCYTESUR Trace* 10/23/2015 1705    Pertinent Imaging: CT stone study  Assessment & Plan:   1. Nephrolithiasis -risks/benefits/alternatives to left URS was explained to the patient and she understands and wishes to proceed with surgery.  There are no diagnoses linked to this encounter.  No Follow-up on file.  Nicolette Bang, MD  [facility] [address], [place], [place] [address] [phone]

## 2015-11-12 NOTE — Anesthesia Preprocedure Evaluation (Signed)
Anesthesia Evaluation  Patient identified by MRN, date of birth, ID band Patient awake    Reviewed: Allergy & Precautions, H&P , NPO status , Patient's Chart, lab work & pertinent test results, reviewed documented beta blocker date and time   Airway Mallampati: III  TM Distance: >3 FB Neck ROM: full    Dental  (+) Teeth Intact   Pulmonary neg pulmonary ROS, former smoker,    Pulmonary exam normal        Cardiovascular Exercise Tolerance: Good hypertension, + CAD and + Past MI  negative cardio ROS Normal cardiovascular exam+ Valvular Problems/Murmurs  Rate:Normal  Sp clearance low risk. JA   Neuro/Psych  Neuromuscular disease negative neurological ROS  negative psych ROS   GI/Hepatic negative GI ROS, Neg liver ROS, GERD  Medicated,(+) Hepatitis -, UnspecifiedNon alcoholic cirrhosis   Endo/Other  negative endocrine ROSdiabetes  Renal/GU Renal diseasenegative Renal ROS  negative genitourinary   Musculoskeletal   Abdominal   Peds  Hematology negative hematology ROS (+)   Anesthesia Other Findings   Reproductive/Obstetrics negative OB ROS                             Anesthesia Physical Anesthesia Plan  ASA: III  Anesthesia Plan: General LMA   Post-op Pain Management:    Induction:   Airway Management Planned:   Additional Equipment:   Intra-op Plan:   Post-operative Plan:   Informed Consent: I have reviewed the patients History and Physical, chart, labs and discussed the procedure including the risks, benefits and alternatives for the proposed anesthesia with the patient or authorized representative who has indicated his/her understanding and acceptance.     Plan Discussed with: CRNA  Anesthesia Plan Comments:         Anesthesia Quick Evaluation

## 2015-11-12 NOTE — Interval H&P Note (Signed)
History and Physical Interval Note:  11/12/2015 7:32 AM  Kimberly Huff  has presented today for surgery, with the diagnosis of LEFT URETERAL CALCULUS  The various methods of treatment have been discussed with the patient and family. After consideration of risks, benefits and other options for treatment, the patient has consented to  Procedure(s): URETEROSCOPY WITH HOLMIUM LASER LITHOTRIPSY (Left) CYSTOSCOPY WITH RETROGRADE PYELOGRAM (Left) CYSTOSCOPY WITH STENT REPLACEMENT (Left) as a surgical intervention .  The patient's history has been reviewed, patient examined, no change in status, stable for surgery.  I have reviewed the patient's chart and labs.  Questions were answered to the patient's satisfaction.    Heart: RRR Chest: CTAB  Nicolette Bang

## 2015-11-12 NOTE — Transfer of Care (Signed)
Immediate Anesthesia Transfer of Care Note  Patient: Kimberly Huff  Procedure(s) Performed: Procedure(s): URETEROSCOPY WITH HOLMIUM LASER LITHOTRIPSY (Left) CYSTOSCOPY WITH RETROGRADE PYELOGRAM (Left) CYSTOSCOPY WITH STENT REPLACEMENT (Left)  Patient Location: PACU  Anesthesia Type:General  Level of Consciousness: awake, alert , oriented and patient cooperative  Airway & Oxygen Therapy: Patient Spontanous Breathing and Patient connected to nasal cannula oxygen  Post-op Assessment: Report given to RN and Post -op Vital signs reviewed and stable  Post vital signs: Reviewed and stable  Last Vitals:  Filed Vitals:   11/12/15 0617  BP: 140/79  Pulse: 83  Temp: 36.9 C  Resp: 14    Last Pain: There were no vitals filed for this visit.       Complications: No apparent anesthesia complications

## 2015-11-12 NOTE — Discharge Instructions (Signed)

## 2015-11-12 NOTE — Op Note (Signed)
.  Preoperative diagnosis: Left ureteral stone  Postoperative diagnosis: Same  Procedure: 1 cystoscopy 2. Left retrograde pyelography 3.  Intraoperative fluoroscopy, under one hour, with interpretation 4.  Left ureteroscopic stone manipulation with laser lithotripsy 5.  Left 6 x 26 JJ stent exchange  Attending: Rosie Fate  Anesthesia: General  Estimated blood loss: None  Drains: Left 6 x 26 JJ ureteral stent with tether  Specimens: stone for analysis  Antibiotics: Rocephin  Findings: left proximal ureteral stone. no hydronephrosis. No stone fragments in the kidney. No masses/lesions in the bladder. Ureteral orifices in normal anatomic location.  Indications: Patient is a 70 year old female with a history of left ureteral calculus who underwent stent placement 2 weeks ago for sepsis.  After discussing treatment options, she decided proceed with left ureteroscopic stone manipulation.  Procedure her in detail: The patient was brought to the operating room and a brief timeout was done to ensure correct patient, correct procedure, correct site.  General anesthesia was administered patient was placed in dorsal lithotomy position.  Her genitalia was then prepped and draped in usual sterile fashion.  A rigid 79 French cystoscope was passed in the urethra and the bladder.  Bladder was inspected free masses or lesions.  the ureteral orifices were in the normal orthotopic locations.  Using a grasper the stent was brought to the urethral meatus. A zipwire was advanced through the stent and up to the renal pelvis. a 6 french ureteral catheter was then instilled into the left ureteral orifice.  a gentle retrograde was obtained and findings noted above. we then removed the cystoscope and cannulated the left ureteral orifice with a flexible ureteroscope.  We located a stone int he proximal ureter. using a 200 nm laser fiber and fragmented the stone into smaller pieces.  the pieces were then removed  with an Escape basket. Once the stone fragments were removed we then performed nephroscopy and noted no residual stone in the kidney.  we then placed a 6 x 26 double-j ureteral stent over the original zip wire. We removed the wire and good coil was noted in the the renal pelvis under fluoroscopy and the bladder under direct vision.   the bladder was then drained and this concluded the procedure which was well tolerated by patient.  Complications: None  Condition: Stable, extubated, transferred to PACU  Plan: Patient is to be discharged home as to follow-up in 2 weeks. She is to remove her stent in 48 hours by pulling the tether.

## 2015-11-18 NOTE — Anesthesia Postprocedure Evaluation (Signed)
Anesthesia Post Note  Patient: Kimberly Huff  Procedure(s) Performed: Procedure(s) (LRB): URETEROSCOPY WITH HOLMIUM LASER LITHOTRIPSY (Left) CYSTOSCOPY WITH RETROGRADE PYELOGRAM (Left) CYSTOSCOPY WITH STENT REPLACEMENT (Left)  Patient location during evaluation: PACU Anesthesia Type: General Level of consciousness: awake and alert Pain management: pain level controlled Vital Signs Assessment: post-procedure vital signs reviewed and stable Respiratory status: spontaneous breathing, nonlabored ventilation, respiratory function stable and patient connected to nasal cannula oxygen Cardiovascular status: blood pressure returned to baseline and stable Postop Assessment: no signs of nausea or vomiting Anesthetic complications: no    Last Vitals:  Filed Vitals:   11/12/15 0936 11/12/15 0954  BP: 119/53 125/71  Pulse: 64 59  Temp: 36.3 C   Resp: 16 16    Last Pain:  Filed Vitals:   11/13/15 0824  PainSc: 0-No pain                 Molli Barrows

## 2015-11-20 ENCOUNTER — Other Ambulatory Visit: Payer: Self-pay | Admitting: Internal Medicine

## 2015-11-21 LAB — STONE ANALYSIS
Ca Oxalate,Dihydrate: 5 %
Ca Oxalate,Monohydr.: 85 %
Ca phos cry stone ql IR: 10 %
Stone Weight KSTONE: 73 mg

## 2015-11-26 ENCOUNTER — Ambulatory Visit (INDEPENDENT_AMBULATORY_CARE_PROVIDER_SITE_OTHER): Payer: BLUE CROSS/BLUE SHIELD | Admitting: Urology

## 2015-11-26 VITALS — BP 109/72 | HR 80 | Ht 63.0 in

## 2015-11-26 DIAGNOSIS — N200 Calculus of kidney: Secondary | ICD-10-CM

## 2015-11-26 NOTE — Progress Notes (Signed)
11/26/2015 1:40 PM   Kimberly Huff [birth year] [id number]  Referring provider: [name], MD [place], [place] [address]  Chief Complaint  Patient presents with  . Routine Post Op    Ureteroscopy laser lithotripsy     HPI: Kimberly Huff is a 70yo here for followup for nephrolithiasis and urinary urgency. She is s/p L URS. Stone composition is CaOx.  Urine pH is 5.0.  She drinks 4 20oz water daily. She adds salt to her food.  She has urgency and urge incontinence daily. She wears pads and uses 2-3 pads per day. No dysuria.     PMH: Past Medical History:  Diagnosis Date  . Allergy   . Atrophic kidney    left  . Blood transfusion   . Blood transfusion without reported diagnosis   . Cataract   . Clotting disorder (HCC)    blood clots in abdomen after surgery  . Coronary artery disease   . Coronary atherosclerosis   . Diabetes mellitus   . Diverticulosis 2005   Dr Jamal Collin  . External hemorrhoids without mention of complication 9485  . GERD (gastroesophageal reflux disease)   . H/O cardiac catheterization 2005  . Heart murmur 2012   found by Dr. Jamal Collin  . Hyperlipidemia   . Hypertension   . Hypopotassemia   . Kidney stone   . Myocardial infarction ([place]) 2000  . Neuromuscular disorder ([place])    "achy muscles"  . Obesity   . Osteoarthritis   . Recurrent UTI   . Spinal stenosis of cervical region   . Steatohepatitis 05/30/10   Brunt Stage 3, non alcoholic cirrhosis of the liver  . Type II or unspecified type diabetes mellitus without mention of complication, uncontrolled    Patient does takes Metformin and recommended to stop for 48 hours.  Marland Kitchen Ulcer   . Unspecified essential hypertension 2014    Surgical History: Past Surgical History:  Procedure Laterality Date  . ABDOMINAL HYSTERECTOMY  1982   complete  . BREAST CYST ASPIRATION Right 1990's   benign  . BREAST MASS EXCISION     benign  . CARDIAC CATHETERIZATION  2000   cardiac stent    . cardiac stents  2000  . CATARACT EXTRACTION     both eyes  . COLONOSCOPY  2004, 2015   Dr. Jamal Collin, Dr. Olevia Perches  . CYSTOSCOPY W/ RETROGRADES Left 11/12/2015   Procedure: CYSTOSCOPY WITH RETROGRADE PYELOGRAM;  Surgeon: Cleon Gustin, MD;  Location: [facility];  Service: Urology;  Laterality: Left;  . CYSTOSCOPY W/ URETERAL STENT PLACEMENT Left 11/12/2015   Procedure: CYSTOSCOPY WITH STENT REPLACEMENT;  Surgeon: Cleon Gustin, MD;  Location: [facility];  Service: Urology;  Laterality: Left;  . CYSTOSCOPY WITH URETEROSCOPY AND STENT PLACEMENT Left 10/15/2015   Procedure: CYSTOSCOPY WITH URETEROSCOPY AND STENT PLACEMENT;  Surgeon: Cleon Gustin, MD;  Location: [facility];  Service: Urology;  Laterality: Left;  . EYE SURGERY Bilateral 2012   cataract  . FOOT SURGERY  2008  . PTCA  2000  . SALPINGOOPHORECTOMY    . TONSILLECTOMY    . UPPER GI ENDOSCOPY    . URETEROSCOPY WITH HOLMIUM LASER LITHOTRIPSY Left 11/12/2015   Procedure: URETEROSCOPY WITH HOLMIUM LASER LITHOTRIPSY;  Surgeon: Cleon Gustin, MD;  Location: [facility];  Service: Urology;  Laterality: Left;  . URETEROTOMY  1960/1969   x 2 during childhood  . VAGINAL HYSTERECTOMY     PT DENIES ABD HYSTERECTOMY  Home Medications:    Medication List       Accurate as of 11/26/15  1:40 PM. Always use your most recent med list.          amoxicillin-clavulanate 875-125 MG tablet Commonly known as:  AUGMENTIN Take 1 tablet by mouth 2 (two) times daily.   aspirin EC 325 MG tablet Take 325 mg by mouth daily.   furosemide 40 MG tablet Commonly known as:  LASIX Take 40 mg by mouth daily.   ibuprofen 800 MG tablet Commonly known as:  ADVIL,MOTRIN Take 1 tablet (800 mg total) by mouth every 8 (eight) hours as needed.   losartan 50 MG tablet Commonly known as:  COZAAR Take 50 mg by mouth daily.   metFORMIN 850 MG tablet Commonly known as:  GLUCOPHAGE Take 850 mg by mouth 2 (two) times daily with a meal.    omeprazole 40 MG capsule Commonly known as:  PRILOSEC Take 40 mg by mouth daily.   oxyCODONE-acetaminophen 5-325 MG tablet Commonly known as:  PERCOCET/ROXICET Take 1 tablet by mouth every 6 (six) hours as needed for moderate pain or severe pain.   phenazopyridine 100 MG tablet Commonly known as:  PYRIDIUM Take 1 tablet (100 mg total) by mouth 3 (three) times daily with meals.   potassium chloride 10 MEQ tablet Commonly known as:  K-DUR Take by mouth.   promethazine 12.5 MG tablet Commonly known as:  PHENERGAN TAKE 1 TABLET BY MOUTH EVERY 8 HOURS AS NEEDED FOR NAUSEA OR VOMITING   simvastatin 40 MG tablet Commonly known as:  ZOCOR Take by mouth.   tamsulosin 0.4 MG Caps capsule Commonly known as:  FLOMAX Take 1 capsule (0.4 mg total) by mouth daily.       Allergies: No Known Allergies  Family History: Family History  Problem Relation Age of Onset  . Autoimmune disease Daughter   . Thyroid disease Daughter   . Thyroid disease Sister   . Lung cancer Father   . Heart disease Father   . Diverticulosis Mother   . Diabetes Mother   . Stroke Mother 46  . Stomach cancer Maternal Aunt   . Breast cancer Maternal Aunt   . Colon cancer Neg Hx   . Esophageal cancer Neg Hx   . Rectal cancer Neg Hx     Social History:  reports that she quit smoking about 17 years ago. Her smoking use included Cigarettes. She has never used smokeless tobacco. She reports that she drinks alcohol. She reports that she does not use drugs.  ROS: UROLOGY Frequent Urination?: No Hard to postpone urination?: Yes Burning/pain with urination?: Yes Get up at night to urinate?: No Leakage of urine?: Yes Urine stream starts and stops?: No Trouble starting stream?: No Do you have to strain to urinate?: No Blood in urine?: No Urinary tract infection?: No Sexually transmitted disease?: No Injury to kidneys or bladder?: No Painful intercourse?: No Weak stream?: No Currently pregnant?:  No Vaginal bleeding?: No Last menstrual period?: n  Gastrointestinal Nausea?: No Vomiting?: No Indigestion/heartburn?: No Diarrhea?: No Constipation?: No  Constitutional Fever: No Night sweats?: No Weight loss?: No Fatigue?: Yes  Skin Skin rash/lesions?: No Itching?: No  Eyes Blurred vision?: No Double vision?: No  Ears/Nose/Throat Sore throat?: No Sinus problems?: No  Hematologic/Lymphatic Swollen glands?: No Easy bruising?: No  Cardiovascular Leg swelling?: No Chest pain?: No  Respiratory Cough?: No Shortness of breath?: No  Endocrine Excessive thirst?: No  Musculoskeletal Back pain?: No Joint pain?: No  Neurological Headaches?: No Dizziness?: No  Psychologic Depression?: No Anxiety?: No  Physical Exam: BP 109/72 (BP Location: Left Arm, Patient Position: Sitting, Cuff Size: Large)   Pulse 80   Ht 5' 3"  (1.6 m)   Constitutional:  Alert and oriented, No acute distress. HEENT: [place] AT, moist mucus membranes.  Trachea midline, no masses. Cardiovascular: No clubbing, cyanosis, or edema. Respiratory: Normal respiratory effort, no increased work of breathing. GI: Abdomen is soft, nontender, nondistended, no abdominal masses GU: No CVA tenderness.  Skin: No rashes, bruises or suspicious lesions. Lymph: No cervical or inguinal adenopathy. Neurologic: Grossly intact, no focal deficits, moving all 4 extremities. Psychiatric: Normal mood and affect.  Laboratory Data: Lab Results  Component Value Date   WBC 6.0 10/18/2015   HGB 11.5 (L) 10/18/2015   HCT 34.2 (L) 10/18/2015   MCV 83.4 10/18/2015   PLT 103 (L) 10/18/2015    Lab Results  Component Value Date   CREATININE 0.69 10/18/2015    No results found for: PSA  No results found for: TESTOSTERONE  Lab Results  Component Value Date   HGBA1C 5.7 01/24/2015    Urinalysis    Component Value Date/Time   COLORURINE Orange (A) 10/23/2015 1651   APPEARANCEUR Clear 11/06/2015 1113    LABSPEC 1.015 10/23/2015 1651   PHURINE 5.5 10/23/2015 1651   GLUCOSEU Negative 11/06/2015 1113   GLUCOSEU 100 (A) 10/23/2015 1651   HGBUR MODERATE (A) 10/23/2015 1651   BILIRUBINUR Negative 11/06/2015 1113   KETONESUR NEGATIVE 10/23/2015 1651   PROTEINUR Negative 11/06/2015 1113   PROTEINUR NEGATIVE 10/15/2015 1444   UROBILINOGEN 1.0 10/23/2015 1705   UROBILINOGEN 2.0 (A) 10/23/2015 1651   NITRITE Positive (A) 11/06/2015 1113   NITRITE POSITIVE (A) 10/23/2015 1651   LEUKOCYTESUR Trace (A) 11/06/2015 1113    Pertinent Imaging: Ct stone study  Assessment & Plan:    1. Nephrolithiasis -pt instructed to cut down on salt and meat in her diet.  -RTC 3 months with renal US - Urinalysis, Complete  2. OAB: -timed voiding -We discussed medical therapy and the patient defers at this time.     No Follow-up on file.  Nicolette Bang, MD  [facility] [address], [place], [place] [address] [phone]

## 2015-11-27 LAB — URINALYSIS, COMPLETE
Bilirubin, UA: NEGATIVE
Nitrite, UA: POSITIVE — AB
RBC, UA: NEGATIVE
Specific Gravity, UA: 1.01 (ref 1.005–1.030)
Urobilinogen, Ur: 1 mg/dL (ref 0.2–1.0)
pH, UA: 5 (ref 5.0–7.5)

## 2015-11-27 LAB — MICROSCOPIC EXAMINATION: Epithelial Cells (non renal): >10 /hpf — AB (ref 0–10)

## 2015-12-18 ENCOUNTER — Other Ambulatory Visit (INDEPENDENT_AMBULATORY_CARE_PROVIDER_SITE_OTHER): Payer: BLUE CROSS/BLUE SHIELD

## 2015-12-18 DIAGNOSIS — E669 Obesity, unspecified: Secondary | ICD-10-CM | POA: Diagnosis not present

## 2015-12-18 DIAGNOSIS — R7989 Other specified abnormal findings of blood chemistry: Secondary | ICD-10-CM | POA: Diagnosis not present

## 2015-12-18 DIAGNOSIS — I10 Essential (primary) hypertension: Secondary | ICD-10-CM | POA: Diagnosis not present

## 2015-12-18 DIAGNOSIS — E119 Type 2 diabetes mellitus without complications: Secondary | ICD-10-CM

## 2015-12-18 DIAGNOSIS — E1159 Type 2 diabetes mellitus with other circulatory complications: Secondary | ICD-10-CM

## 2015-12-18 DIAGNOSIS — E1169 Type 2 diabetes mellitus with other specified complication: Principal | ICD-10-CM

## 2015-12-18 LAB — COMPREHENSIVE METABOLIC PANEL
ALT: 53 U/L — ABNORMAL HIGH (ref 0–35)
AST: 42 U/L — ABNORMAL HIGH (ref 0–37)
Albumin: 4.7 g/dL (ref 3.5–5.2)
Alkaline Phosphatase: 64 U/L (ref 39–117)
BUN: 21 mg/dL (ref 6–23)
CO2: 29 mEq/L (ref 19–32)
Calcium: 10.4 mg/dL (ref 8.4–10.5)
Chloride: 101 mEq/L (ref 96–112)
Creatinine, Ser: 0.86 mg/dL (ref 0.40–1.20)
GFR: 68.88 mL/min (ref >60.00)
Glucose, Bld: 121 mg/dL — ABNORMAL HIGH (ref 70–99)
Potassium: 4.9 mEq/L (ref 3.5–5.1)
Sodium: 138 mEq/L (ref 135–145)
Total Bilirubin: 0.5 mg/dL (ref 0.2–1.2)
Total Protein: 7.2 g/dL (ref 6.0–8.3)

## 2015-12-18 LAB — LIPID PANEL
Cholesterol: 139 mg/dL (ref 0–200)
HDL: 26.6 mg/dL — ABNORMAL LOW (ref >39.00)
NonHDL: 112.82
Total CHOL/HDL Ratio: 5
Triglycerides: 276 mg/dL — ABNORMAL HIGH (ref 0.0–149.0)
VLDL: 55.2 mg/dL — ABNORMAL HIGH (ref 0.0–40.0)

## 2015-12-18 LAB — MICROALBUMIN / CREATININE URINE RATIO
Creatinine,U: 126.1 mg/dL
Microalb Creat Ratio: 2.3 mg/g (ref 0.0–30.0)
Microalb, Ur: 2.9 mg/dL — ABNORMAL HIGH (ref 0.0–1.9)

## 2015-12-18 LAB — LDL CHOLESTEROL, DIRECT: Direct LDL: 85 mg/dL

## 2015-12-18 LAB — HEMOGLOBIN A1C: Hgb A1c MFr Bld: 6.2 % (ref 4.6–6.5)

## 2015-12-20 ENCOUNTER — Encounter: Payer: Self-pay | Admitting: Internal Medicine

## 2015-12-30 ENCOUNTER — Other Ambulatory Visit: Payer: Self-pay | Admitting: Internal Medicine

## 2016-02-06 ENCOUNTER — Encounter: Payer: Self-pay | Admitting: Internal Medicine

## 2016-02-06 DIAGNOSIS — K7581 Nonalcoholic steatohepatitis (NASH): Secondary | ICD-10-CM

## 2016-02-06 DIAGNOSIS — E1169 Type 2 diabetes mellitus with other specified complication: Secondary | ICD-10-CM

## 2016-02-06 DIAGNOSIS — E1122 Type 2 diabetes mellitus with diabetic chronic kidney disease: Secondary | ICD-10-CM

## 2016-02-06 DIAGNOSIS — I10 Essential (primary) hypertension: Secondary | ICD-10-CM

## 2016-02-06 DIAGNOSIS — E785 Hyperlipidemia, unspecified: Secondary | ICD-10-CM

## 2016-02-07 ENCOUNTER — Other Ambulatory Visit: Payer: Self-pay | Admitting: Internal Medicine

## 2016-02-07 DIAGNOSIS — K7469 Other cirrhosis of liver: Secondary | ICD-10-CM

## 2016-02-07 DIAGNOSIS — K7581 Nonalcoholic steatohepatitis (NASH): Secondary | ICD-10-CM

## 2016-02-10 ENCOUNTER — Telehealth: Payer: Self-pay | Admitting: Urology

## 2016-02-10 NOTE — Telephone Encounter (Signed)
Patient called and said over the weekend she ran a fever in the 100's and was having back pain. Today her fever was down to 99 and still having back pain. I spoke with [facility] and offered the patient an appointment for today to come in to have a cath specimen done and check for infection. She said she would just wait it out see how things went.   Sharyn Lull

## 2016-02-14 ENCOUNTER — Ambulatory Visit: Admission: RE | Admit: 2016-02-14 | Payer: BLUE CROSS/BLUE SHIELD | Source: Ambulatory Visit

## 2016-02-21 ENCOUNTER — Ambulatory Visit
Admission: RE | Admit: 2016-02-21 | Discharge: 2016-02-21 | Disposition: A | Discharge status: Home / Self Care | Payer: PPO | Source: Ambulatory Visit | Attending: Urology | Admitting: Urology

## 2016-02-21 DIAGNOSIS — N261 Atrophy of kidney (terminal): Secondary | ICD-10-CM | POA: Insufficient documentation

## 2016-02-21 DIAGNOSIS — N200 Calculus of kidney: Secondary | ICD-10-CM | POA: Diagnosis not present

## 2016-02-25 ENCOUNTER — Ambulatory Visit: Payer: PPO | Admitting: Urology

## 2016-02-25 ENCOUNTER — Encounter: Payer: Self-pay | Admitting: Urology

## 2016-02-25 VITALS — BP 133/78 | HR 81 | Ht 63.0 in | Wt 196.0 lb

## 2016-02-25 DIAGNOSIS — N3941 Urge incontinence: Secondary | ICD-10-CM | POA: Insufficient documentation

## 2016-02-25 DIAGNOSIS — N200 Calculus of kidney: Secondary | ICD-10-CM | POA: Diagnosis not present

## 2016-02-25 NOTE — Progress Notes (Signed)
02/25/2016 11:18 AM   Kimberly Huff 02-17-1946 [id number]  Referring provider: [name], MD [place], [place] [address]  Chief Complaint  Patient presents with  . Follow-up    44month w/renal u/s results    HPI: Kimberly BTejadais a 89yoseen in followup for nephrolithiasis. She is s/p L URS for a UPJ calculus in a left atrophic kidney. She had left flank pain with a fever of 102 and went to urgent care. She got no relief and took amoxicillin at home which stopped the fever. She denies any LUTS. NO hematuria. No stone events since last visit.  Renal [place] shows 549mL lower pole calculus   She also has urgency with urge incontinence. She uses 2-3 pads per day which are soaked. She has not tried fluid management or timed voiding. No exacerbating/alleviating events. No other associated symptoms  PMH: Past Medical History:  Diagnosis Date  . Allergy   . Atrophic kidney    left  . Blood transfusion   . Blood transfusion without reported diagnosis   . Cataract   . Clotting disorder (HCC)    blood clots in abdomen after surgery  . Coronary artery disease   . Coronary atherosclerosis   . Diabetes mellitus   . Diverticulosis 2005   Dr Kimberly Huff. External hemorrhoids without mention of complication 209794. GERD (gastroesophageal reflux disease)   . H/O cardiac catheterization 2005  . Heart murmur 2012   found by Dr. SaJamal Huff. Hyperlipidemia   . Hypertension   . Hypopotassemia   . Kidney stone   . Myocardial infarction 2000  . [place]romuscular disorder ([place]   "achy muscles"  . Obesity   . Osteoarthritis   . Recurrent UTI   . Spinal stenosis of cervical region   . Steatohepatitis 05/30/10   Brunt Stage 3, non alcoholic cirrhosis of the liver  . Type II or unspecified type diabetes mellitus without mention of complication, uncontrolled    Patient does takes Metformin and recommended to stop for 48 hours.  . Marland Kitchenlcer ([place]  . Unspecified essential  hypertension 2014    Surgical History: Past Surgical History:  Procedure Laterality Date  . ABDOMINAL HYSTERECTOMY  1982   complete  . BREAST CYST ASPIRATION Right 1990's   benign  . BREAST MASS EXCISION     benign  . CARDIAC CATHETERIZATION  2000   cardiac stent  . cardiac stents  2000  . CATARACT EXTRACTION     both eyes  . COLONOSCOPY  2004, 2015   Dr. SaJamal CollinDr. BrOlevia Huff. CYSTOSCOPY W/ RETROGRADES Left 11/12/2015   Procedure: CYSTOSCOPY WITH RETROGRADE PYELOGRAM;  Surgeon: Kimberly GustinMD;  Location: [facility];  Service: Urology;  Laterality: Left;  . CYSTOSCOPY W/ URETERAL STENT PLACEMENT Left 11/12/2015   Procedure: CYSTOSCOPY WITH STENT REPLACEMENT;  Surgeon: Kimberly GustinMD;  Location: [facility];  Service: Urology;  Laterality: Left;  . CYSTOSCOPY WITH URETEROSCOPY AND STENT PLACEMENT Left 10/15/2015   Procedure: CYSTOSCOPY WITH URETEROSCOPY AND STENT PLACEMENT;  Surgeon: Kimberly GustinMD;  Location: [facility];  Service: Urology;  Laterality: Left;  . EYE SURGERY Bilateral 2012   cataract  . FOOT SURGERY  2008  . PTCA  2000  . SALPINGOOPHORECTOMY    . TONSILLECTOMY    . UPPER GI ENDOSCOPY    . URETEROSCOPY WITH HOLMIUM LASER LITHOTRIPSY Left 11/12/2015   Procedure: URETEROSCOPY WITH HOLMIUM LASER  LITHOTRIPSY;  Surgeon: Kimberly Gustin, MD;  Location: [facility];  Service: Urology;  Laterality: Left;  . URETEROTOMY  1960/1969   x 2 during childhood  . VAGINAL HYSTERECTOMY     PT DENIES ABD HYSTERECTOMY    Home Medications:    Medication List       Accurate as of 02/25/16 11:18 AM. Always use your most recent med list.          aspirin EC 325 MG tablet Take 325 mg by mouth daily.   furosemide 40 MG tablet Commonly known as:  LASIX TAKE 1 TABLET BY MOUTH EVERY DAY   ibuprofen 800 MG tablet Commonly known as:  ADVIL,MOTRIN Take 1 tablet (800 mg total) by mouth every 8 (eight) hours as needed.   losartan 50 MG tablet Commonly known as:   COZAAR Take 50 mg by mouth daily.   metFORMIN 850 MG tablet Commonly known as:  GLUCOPHAGE Take 850 mg by mouth 2 (two) times daily with a meal.   omeprazole 40 MG capsule Commonly known as:  PRILOSEC Take 40 mg by mouth daily.   potassium chloride 10 MEQ tablet Commonly known as:  K-DUR Take by mouth.   simvastatin 40 MG tablet Commonly known as:  ZOCOR Take by mouth.       Allergies: No Known Allergies  Family History: Family History  Problem Relation Age of Onset  . Lung cancer Father   . Heart disease Father   . Diverticulosis Mother   . Diabetes Mother   . Stroke Mother 78  . Autoimmune disease Daughter   . Thyroid disease Daughter   . Thyroid disease Sister   . Stomach cancer Maternal Aunt   . Breast cancer Maternal Aunt   . Colon cancer Neg Hx   . Esophageal cancer Neg Hx   . Rectal cancer Neg Hx     Social History:  reports that she quit smoking about 17 years ago. Her smoking use included Cigarettes. She has never used smokeless tobacco. She reports that she drinks alcohol. She reports that she does not use drugs.  ROS: UROLOGY Frequent Urination?: No Hard to postpone urination?: Yes Burning/pain with urination?: No Get up at night to urinate?: No Leakage of urine?: Yes Urine stream starts and stops?: No Trouble starting stream?: No Do you have to strain to urinate?: No Blood in urine?: No Urinary tract infection?: No Sexually transmitted disease?: No Injury to kidneys or bladder?: No Painful intercourse?: No Weak stream?: No Currently pregnant?: No Vaginal bleeding?: No Last menstrual period?: n  Gastrointestinal Nausea?: No Vomiting?: No Indigestion/heartburn?: No Diarrhea?: No Constipation?: No  Constitutional Fever: No Night sweats?: No Weight loss?: No Fatigue?: Yes  Skin Skin rash/lesions?: No Itching?: No  Eyes Blurred vision?: No Double vision?: No  Ears/Nose/Throat Sore throat?: No Sinus problems?:  No  Hematologic/Lymphatic Swollen glands?: No Easy bruising?: No  Cardiovascular Leg swelling?: No Chest pain?: No  Respiratory Cough?: No Shortness of breath?: No  Endocrine Excessive thirst?: No  Musculoskeletal Back pain?: No Joint pain?: No  Neurological Headaches?: No Dizziness?: No  Psychologic Depression?: No Anxiety?: No  Physical Exam: BP 133/78   Pulse 81   Ht 5' 3"  (1.6 m)   Wt 88.9 kg (196 lb)   BMI 34.72 kg/m   Constitutional:  Alert and oriented, No acute distress. HEENT: [place] AT, moist mucus membranes.  Trachea midline, no masses. Cardiovascular: No clubbing, cyanosis, or edema. Respiratory: Normal respiratory effort, no increased work of breathing. GI:  Abdomen is soft, nontender, nondistended, no abdominal masses GU: No CVA tenderness.  Skin: No rashes, bruises or suspicious lesions. Lymph: No cervical or inguinal adenopathy. Neurologic: Grossly intact, no focal deficits, moving all 4 extremities. Psychiatric: Normal mood and affect.  Laboratory Data: Lab Results  Component Value Date   WBC 6.0 10/18/2015   HGB 11.5 (L) 10/18/2015   HCT 34.2 (L) 10/18/2015   MCV 83.4 10/18/2015   PLT 103 (L) 10/18/2015    Lab Results  Component Value Date   CREATININE 0.86 12/18/2015    No results found for: PSA  No results found for: TESTOSTERONE  Lab Results  Component Value Date   HGBA1C 6.2 12/18/2015    Urinalysis    Component Value Date/Time   COLORURINE Orange (A) 10/23/2015 1651   APPEARANCEUR Clear 11/26/2015 1340   LABSPEC 1.015 10/23/2015 1651   PHURINE 5.5 10/23/2015 1651   GLUCOSEU Trace (A) 11/26/2015 1340   GLUCOSEU 100 (A) 10/23/2015 1651   HGBUR MODERATE (A) 10/23/2015 1651   BILIRUBINUR Negative 11/26/2015 1340   KETONESUR NEGATIVE 10/23/2015 1651   PROTEINUR Trace (A) 11/26/2015 1340   PROTEINUR NEGATIVE 10/15/2015 1444   UROBILINOGEN 1.0 10/23/2015 1705   UROBILINOGEN 2.0 (A) 10/23/2015 1651   NITRITE Positive (A)  11/26/2015 1340   NITRITE POSITIVE (A) 10/23/2015 1651   LEUKOCYTESUR Trace (A) 11/26/2015 1340    Pertinent Imaging: Renal US  Assessment & Plan:    1. Nephrolithiasis: -observation -Renal US in 3 months  2. Urge incontinence -Timed voiding -fluid management  There are no diagnoses linked to this encounter.  No Follow-up on file.  Nicolette Bang, MD  [facility] [address], [place], [place] [address] [phone]

## 2016-02-26 ENCOUNTER — Other Ambulatory Visit: Payer: Self-pay | Admitting: Internal Medicine

## 2016-02-28 MED ORDER — METFORMIN HCL 850 MG PO TABS: 850.0000 mg | ORAL_TABLET | Freq: Two times a day (BID) | ORAL | 3 refills | Status: DC

## 2016-02-28 NOTE — Telephone Encounter (Signed)
Patient has pending labs and last OV 6/17 ok o fill motrin?

## 2016-02-28 NOTE — Telephone Encounter (Signed)
both have been refilled

## 2016-03-11 ENCOUNTER — Ambulatory Visit
Admission: RE | Admit: 2016-03-11 | Discharge: 2016-03-11 | Disposition: A | Discharge status: Home / Self Care | Payer: PPO | Source: Ambulatory Visit | Attending: Internal Medicine | Admitting: Internal Medicine

## 2016-03-11 DIAGNOSIS — K7581 Nonalcoholic steatohepatitis (NASH): Secondary | ICD-10-CM | POA: Diagnosis not present

## 2016-03-11 DIAGNOSIS — K7469 Other cirrhosis of liver: Secondary | ICD-10-CM | POA: Diagnosis not present

## 2016-03-11 DIAGNOSIS — K7460 Unspecified cirrhosis of liver: Secondary | ICD-10-CM | POA: Diagnosis not present

## 2016-03-13 ENCOUNTER — Encounter: Payer: Self-pay | Admitting: Internal Medicine

## 2016-03-14 ENCOUNTER — Other Ambulatory Visit: Payer: Self-pay | Admitting: Internal Medicine

## 2016-03-16 ENCOUNTER — Ambulatory Visit: Payer: BLUE CROSS/BLUE SHIELD | Admitting: Internal Medicine

## 2016-03-19 ENCOUNTER — Other Ambulatory Visit: Payer: Self-pay

## 2016-03-19 DIAGNOSIS — Z1231 Encounter for screening mammogram for malignant neoplasm of breast: Secondary | ICD-10-CM

## 2016-04-20 ENCOUNTER — Other Ambulatory Visit (INDEPENDENT_AMBULATORY_CARE_PROVIDER_SITE_OTHER): Payer: PPO

## 2016-04-20 DIAGNOSIS — R7989 Other specified abnormal findings of blood chemistry: Secondary | ICD-10-CM | POA: Diagnosis not present

## 2016-04-20 DIAGNOSIS — K7581 Nonalcoholic steatohepatitis (NASH): Secondary | ICD-10-CM

## 2016-04-20 DIAGNOSIS — E1122 Type 2 diabetes mellitus with diabetic chronic kidney disease: Secondary | ICD-10-CM | POA: Diagnosis not present

## 2016-04-20 DIAGNOSIS — E785 Hyperlipidemia, unspecified: Secondary | ICD-10-CM | POA: Diagnosis not present

## 2016-04-20 DIAGNOSIS — E1169 Type 2 diabetes mellitus with other specified complication: Secondary | ICD-10-CM | POA: Diagnosis not present

## 2016-04-20 LAB — LIPID PANEL
Cholesterol: 136 mg/dL (ref 0–200)
HDL: 23.7 mg/dL — ABNORMAL LOW (ref >39.00)
NonHDL: 112.66
Total CHOL/HDL Ratio: 6
Triglycerides: 259 mg/dL — ABNORMAL HIGH (ref 0.0–149.0)
VLDL: 51.8 mg/dL — ABNORMAL HIGH (ref 0.0–40.0)

## 2016-04-20 LAB — CBC WITH DIFFERENTIAL/PLATELET
Basophils Absolute: 0 10*3/uL (ref 0.0–0.1)
Basophils Relative: 0.4 % (ref 0.0–3.0)
Eosinophils Absolute: 0.2 10*3/uL (ref 0.0–0.7)
Eosinophils Relative: 3.1 % (ref 0.0–5.0)
HCT: 42.6 % (ref 36.0–46.0)
Hemoglobin: 14.3 g/dL (ref 12.0–15.0)
Lymphocytes Relative: 37.9 % (ref 12.0–46.0)
Lymphs Abs: 2.7 10*3/uL (ref 0.7–4.0)
MCHC: 33.5 g/dL (ref 30.0–36.0)
MCV: 83.3 fl (ref 78.0–100.0)
Monocytes Absolute: 0.4 10*3/uL (ref 0.1–1.0)
Monocytes Relative: 5.1 % (ref 3.0–12.0)
Neutro Abs: 3.8 10*3/uL (ref 1.4–7.7)
Neutrophils Relative %: 53.5 % (ref 43.0–77.0)
Platelets: 168 10*3/uL (ref 150.0–400.0)
RBC: 5.12 Mil/uL — ABNORMAL HIGH (ref 3.87–5.11)
RDW: 13.8 % (ref 11.5–15.5)
WBC: 7 10*3/uL (ref 4.0–10.5)

## 2016-04-20 LAB — COMPREHENSIVE METABOLIC PANEL
ALT: 62 U/L — ABNORMAL HIGH (ref 0–35)
AST: 46 U/L — ABNORMAL HIGH (ref 0–37)
Albumin: 4.6 g/dL (ref 3.5–5.2)
Alkaline Phosphatase: 77 U/L (ref 39–117)
BUN: 23 mg/dL (ref 6–23)
CO2: 30 mEq/L (ref 19–32)
Calcium: 9.8 mg/dL (ref 8.4–10.5)
Chloride: 103 mEq/L (ref 96–112)
Creatinine, Ser: 0.68 mg/dL (ref 0.40–1.20)
GFR: 90.23 mL/min (ref >60.00)
Glucose, Bld: 136 mg/dL — ABNORMAL HIGH (ref 70–99)
Potassium: 4.9 mEq/L (ref 3.5–5.1)
Sodium: 141 mEq/L (ref 135–145)
Total Bilirubin: 0.5 mg/dL (ref 0.2–1.2)
Total Protein: 6.8 g/dL (ref 6.0–8.3)

## 2016-04-20 LAB — MICROALBUMIN / CREATININE URINE RATIO
Creatinine,U: 118.5 mg/dL
Microalb Creat Ratio: 5.7 mg/g (ref 0.0–30.0)
Microalb, Ur: 6.7 mg/dL — ABNORMAL HIGH (ref 0.0–1.9)

## 2016-04-20 LAB — HEMOGLOBIN A1C: Hgb A1c MFr Bld: 6.3 % (ref 4.6–6.5)

## 2016-04-20 LAB — LDL CHOLESTEROL, DIRECT: Direct LDL: 86 mg/dL

## 2016-04-21 LAB — AFP TUMOR MARKER: AFP-Tumor Marker: 7.1 ng/mL — ABNORMAL HIGH (ref <6.1)

## 2016-04-22 ENCOUNTER — Ambulatory Visit: Payer: BLUE CROSS/BLUE SHIELD | Admitting: Internal Medicine

## 2016-04-28 ENCOUNTER — Other Ambulatory Visit: Payer: Self-pay | Admitting: Internal Medicine

## 2016-05-01 ENCOUNTER — Ambulatory Visit (INDEPENDENT_AMBULATORY_CARE_PROVIDER_SITE_OTHER): Payer: PPO | Admitting: Internal Medicine

## 2016-05-01 DIAGNOSIS — E1129 Type 2 diabetes mellitus with other diabetic kidney complication: Secondary | ICD-10-CM

## 2016-05-01 DIAGNOSIS — E1169 Type 2 diabetes mellitus with other specified complication: Secondary | ICD-10-CM

## 2016-05-01 DIAGNOSIS — E1122 Type 2 diabetes mellitus with diabetic chronic kidney disease: Secondary | ICD-10-CM

## 2016-05-01 DIAGNOSIS — N200 Calculus of kidney: Secondary | ICD-10-CM

## 2016-05-01 DIAGNOSIS — J069 Acute upper respiratory infection, unspecified: Secondary | ICD-10-CM | POA: Diagnosis not present

## 2016-05-01 DIAGNOSIS — N181 Chronic kidney disease, stage 1: Secondary | ICD-10-CM

## 2016-05-01 DIAGNOSIS — Z8719 Personal history of other diseases of the digestive system: Secondary | ICD-10-CM

## 2016-05-01 DIAGNOSIS — B9789 Other viral agents as the cause of diseases classified elsewhere: Secondary | ICD-10-CM

## 2016-05-01 DIAGNOSIS — K7581 Nonalcoholic steatohepatitis (NASH): Secondary | ICD-10-CM

## 2016-05-01 DIAGNOSIS — R809 Proteinuria, unspecified: Secondary | ICD-10-CM

## 2016-05-01 DIAGNOSIS — E785 Hyperlipidemia, unspecified: Secondary | ICD-10-CM

## 2016-05-01 DIAGNOSIS — Z8711 Personal history of peptic ulcer disease: Secondary | ICD-10-CM

## 2016-05-01 MED ORDER — AMOXICILLIN-POT CLAVULANATE 875-125 MG PO TABS: 1.0000 | ORAL_TABLET | Freq: Two times a day (BID) | ORAL | 0 refills | Status: DC

## 2016-05-01 MED ORDER — OSELTAMIVIR PHOSPHATE 75 MG PO CAPS: 75.0000 mg | ORAL_CAPSULE | Freq: Two times a day (BID) | ORAL | 0 refills | Status: DC

## 2016-05-01 MED ORDER — HYDROCOD POLST-CPM POLST ER 10-8 MG/5ML PO SUER: 5.0000 mL | Freq: Every evening | ORAL | 0 refills | Status: DC | PRN

## 2016-05-01 MED ORDER — MELOXICAM 15 MG PO TABS: 15.0000 mg | ORAL_TABLET | Freq: Every day | ORAL | 0 refills | Status: DC

## 2016-05-01 NOTE — Progress Notes (Signed)
Subjective:  Patient ID: Kimberly Huff, female    DOB: 01-03-1946  Age: 70 y.o. MRN: [id number]  CC: Diagnoses of Viral URI with cough, Controlled type 2 diabetes mellitus with stage 1 chronic kidney disease, without long-term current use of insulin ([place]), Dyslipidemia associated with type 2 diabetes mellitus ([place]), Microalbuminuria due to type 2 diabetes mellitus ([place]), NASH (nonalcoholic steatohepatitis), Nephrolithiasis, and Personal history of gastric ulcer were pertinent to this visit.   Kimberly Huff presents for follow up  On type 2 Dm, obesity with fatty liver, and nephrolithiasis.   Since her last office visit she underwent removal of the  UPJ stent from obstructing stone on left side in July.  since then has had increased urinary incontinence.   She has also had several episodes of  loose stools,  Fecal incontinence when stools are very loose.   Fatty liver.  Recent labs were notable for mild elevation of liver enzymes.  She does not want to return to [facility] due to lack of continuity. she sees a different PA every time.   Foot exam normal except for decreased sensation over callouses.    Lab Results  Component Value Date   ALT 62 (H) 04/24/16   AST 46 (H) 04-24-16   ALKPHOS 77 Apr 24, 2016   BILITOT 0.5 2016/04/24   Lab Results  Component Value Date   HGBA1C 6.3 04/24/2016   Lab Results  Component Value Date   CREATININE 0.68 04/24/2016    Her mother died in 07/31/22, and she has been preoccupied with sorting through the trash her mother hoarded along with her keepsakes. The increased activity has resulted in some back pain ,  Using ibuprofen sparingly. Wants to try meloxicam   URI:  Started last night .  Sore throat, cough, and sinus congestion.  She denies fevers and body aches.  But did have several  sick contacts over the holidays.  18 month granddaughter had the flu with fever 103    Lab Results  Component Value Date   HGBA1C 6.3 2016-04-24      Outpatient Medications Prior to Visit  Medication Sig Dispense Refill  . aspirin EC 325 MG tablet Take 325 mg by mouth daily.    . furosemide (LASIX) 40 MG tablet TAKE 1 TABLET BY MOUTH EVERY DAY 30 tablet 5  . ibuprofen (ADVIL,MOTRIN) 800 MG tablet TAKE 1 TABLET BY MOUTH EVERY 8 HOURS AS NEEDED 30 tablet 5  . losartan (COZAAR) 50 MG tablet Take 50 mg by mouth daily.    . metFORMIN (GLUCOPHAGE) 850 MG tablet TAKE ONE TABLET BY MOUTH TWICE DAILY WITH A MEAL 180 tablet 3  . metFORMIN (GLUCOPHAGE) 850 MG tablet Take 1 tablet (850 mg total) by mouth 2 (two) times daily with a meal. 60 tablet 3  . omeprazole (PRILOSEC) 40 MG capsule TAKE 1 CAPSULE EVERY DAY 30 capsule 2  . potassium chloride (K-DUR) 10 MEQ tablet Take by mouth.    . simvastatin (ZOCOR) 40 MG tablet Take by mouth.    . simvastatin (ZOCOR) 40 MG tablet TAKE ONE TABLET BY MOUTH EVERY DAY AT 6PM 90 tablet 1   No facility-administered medications prior to visit.     Review of Systems;  Patient denies headache, fevers, malaise, unintentional weight loss, skin rash, eye pain, sinus congestion and sinus pain, sore throat, dysphagia,  hemoptysis , cough, dyspnea, wheezing, chest pain, palpitations, orthopnea, edema, abdominal pain, nausea, melena, diarrhea, constipation, flank pain, dysuria, hematuria, urinary  Frequency,  nocturia, numbness, tingling, seizures,  Focal weakness, Loss of consciousness,  Tremor, insomnia, depression, anxiety, and suicidal ideation.      Objective:  BP 110/64 (BP Location: Left Arm, Patient Position: Sitting, Cuff Size: Large)   Pulse 82   Temp 98 F (36.7 C) (Oral)   Resp 18   Wt 193 lb 2 oz (87.6 kg)   SpO2 96%   BMI 34.21 kg/m   BP Readings from Last 3 Encounters:  05/01/16 110/64  02/25/16 133/78  11/26/15 109/72    Wt Readings from Last 3 Encounters:  05/01/16 193 lb 2 oz (87.6 kg)  02/25/16 196 lb (88.9 kg)  11/12/15 187 lb (84.8 kg)    General appearance: alert, cooperative  and appears stated age Ears: normal TM's and external ear canals both ears Throat: lips, mucosa, and tongue normal; teeth and gums normal Neck: no adenopathy, no carotid bruit, supple, symmetrical, trachea midline and thyroid not enlarged, symmetric, no tenderness/mass/nodules Back: symmetric, no curvature. ROM normal. No CVA tenderness. Lungs: clear to auscultation bilaterally Heart: regular rate and rhythm, S1, S2 normal, no murmur, click, rub or gallop Abdomen: soft, non-tender; bowel sounds normal; no masses,  no organomegaly Pulses: 2+ and symmetric Skin: Skin color, texture, turgor normal. No rashes or lesions Lymph nodes: Cervical, supraclavicular, and axillary nodes normal.  Lab Results  Component Value Date   HGBA1C 6.3 04/20/2016   HGBA1C 6.2 12/18/2015   HGBA1C 5.7 01/24/2015    Lab Results  Component Value Date   CREATININE 0.68 04/20/2016   CREATININE 0.86 12/18/2015   CREATININE 0.69 10/18/2015    Lab Results  Component Value Date   WBC 7.0 04/20/2016   HGB 14.3 04/20/2016   HCT 42.6 04/20/2016   PLT 168.0 04/20/2016   GLUCOSE 136 (H) 04/20/2016   CHOL 136 04/20/2016   TRIG 259.0 (H) 04/20/2016   HDL 23.70 (L) 04/20/2016   LDLDIRECT 86.0 04/20/2016   LDLCALC 49 01/24/2015   ALT 62 (H) 04/20/2016   AST 46 (H) 04/20/2016   NA 141 04/20/2016   K 4.9 04/20/2016   CL 103 04/20/2016   CREATININE 0.68 04/20/2016   BUN 23 04/20/2016   CO2 30 04/20/2016   TSH 0.76 09/10/2014   INR 1.0 09/10/2014   HGBA1C 6.3 04/20/2016   MICROALBUR 6.7 (H) 04/20/2016    [place] Art/ven Flow Abd Pelv Doppler  Result Date: 03/11/2016 CLINICAL DATA:  Cirrhosis.  NASH.  Annual imaging. EXAM: DUPLEX ULTRASOUND OF LIVER TECHNIQUE: Color and duplex Doppler ultrasound was performed to evaluate the hepatic in-flow and out-flow vessels. COMPARISON:  09/27/2014 FINDINGS: Portal Vein Velocities Main:  39 cm/sec Right:  34 cm/sec Left:  27 cm/sec Hepatic Vein Velocities Right:  28 cm/sec  Middle:  21 cm/sec Left:  17 cm/sec Hepatic Artery Velocity:  149 cm/sec Splenic Vein Velocity:  44 cm/sec Varices: Absent Ascites: Absent The spleen measures 12.9 x 12.3 x 5.0 cm. This is upper normal in size. Portal veins are hepatopetal inflow. Hepatic veins are hepatofugal. Splenic vein is also patent with normal directionality of flow. Diffusely increased echogenicity throughout the liver is stable. IMPRESSION: Portal and hepatic veins remain patent.  No evidence of ascites. Electronically Signed   By: Marybelle Killings M.D.   On: 03/11/2016 10:05    Assessment & Plan:   Problem List Items Addressed This Visit    Dyslipidemia associated with type 2 diabetes mellitus ([place])    LDL and triglycerides are at goal on simvastatin. She has no side effects  and liver enzymes are elevated fur to fatty liver,  But stable.  normal. No changes today.  Lab Results  Component Value Date   CHOL 136 04/20/2016   HDL 23.70 (L) 04/20/2016   LDLCALC 49 01/24/2015   LDLDIRECT 86.0 04/20/2016   TRIG 259.0 (H) 04/20/2016   CHOLHDL 6 04/20/2016           Microalbuminuria due to type 2 diabetes mellitus ([place])    She is tolerating losartan.  Lab Results  Component Value Date   MICROALBUR 6.7 (H) 04/20/2016   Lab Results  Component Value Date   CREATININE 0.68 04/20/2016         NASH (nonalcoholic steatohepatitis)    Confirmed with biopsy 2012,  With stage III fibrosis.  She is up to date on vaccines.  Taking statin for management of hyperlipidemia.  Diabetes and obesity addressed with medications and Low GI diet . LFTS are mildly elevated.  Annual ultrasound has been done. She is requesting transfer of care to local liver specialist.  Lab Results  Component Value Date   ALT 62 (H) 04/20/2016   AST 46 (H) 04/20/2016   ALKPHOS 77 04/20/2016   BILITOT 0.5 04/20/2016           Nephrolithiasis   Personal history of gastric ulcer    Cautioned her against daily use of meloxicam.  Continue PPI       Type 2 diabetes mellitus, controlled, with renal complications ([place])      well-controlled on current medications . Patient is up to date on  annual eye exam  And has early  Microalbuminuria. She is  tolerating an  ACE/ARB for renal protection and hypertension .   Lab Results  Component Value Date   HGBA1C 6.3 04/20/2016   Lab Results  Component Value Date   MICROALBUR 6.7 (H) 04/20/2016             Viral URI with cough    Given her age, the recent contact with the flu, and the impending holiday , I have given her rx's for both tami flu and augmentin with instructions on the conditions which would indicate the need to use both,  Cough suppressant rx given as well       Relevant Medications   oseltamivir (TAMIFLU) 75 MG capsule      I am having Ms. Waterhouse start on meloxicam and chlorpheniramine-HYDROcodone. I am also having her maintain her losartan, aspirin EC, potassium chloride, simvastatin, furosemide, ibuprofen, metFORMIN, metFORMIN, omeprazole, simvastatin, amoxicillin-clavulanate, and oseltamivir.  Meds ordered this encounter  Medications  . DISCONTD: oseltamivir (TAMIFLU) 75 MG capsule    Sig: Take 1 capsule (75 mg total) by mouth 2 (two) times daily.    Dispense:  10 capsule    Refill:  0  . meloxicam (MOBIC) 15 MG tablet    Sig: Take 1 tablet (15 mg total) by mouth daily.    Dispense:  30 tablet    Refill:  0  . DISCONTD: amoxicillin-clavulanate (AUGMENTIN) 875-125 MG tablet    Sig: Take 1 tablet by mouth 2 (two) times daily.    Dispense:  14 tablet    Refill:  0  . chlorpheniramine-HYDROcodone (TUSSIONEX PENNKINETIC ER) 10-8 MG/5ML SUER    Sig: Take 5 mLs by mouth at bedtime as needed for cough.    Dispense:  140 mL    Refill:  0  . amoxicillin-clavulanate (AUGMENTIN) 875-125 MG tablet    Sig: Take 1 tablet by mouth 2 (two)  times daily.    Dispense:  14 tablet    Refill:  0  . oseltamivir (TAMIFLU) 75 MG capsule    Sig: Take 1 capsule (75 mg total) by  mouth 2 (two) times daily.    Dispense:  10 capsule    Refill:  0    Medications Discontinued During This Encounter  Medication Reason  . amoxicillin-clavulanate (AUGMENTIN) 875-125 MG tablet Reorder  . oseltamivir (TAMIFLU) 75 MG capsule Reorder    Follow-up: Return in about 6 months (around 10/30/2016) for follow up diabetes.   [name], MD

## 2016-05-01 NOTE — Progress Notes (Signed)
Pre visit review using our clinic review tool, if applicable. No additional management support is needed unless otherwise documented below in the visit note. 

## 2016-05-01 NOTE — Patient Instructions (Addendum)
You can use 1/2 to one full meloxicam  Daily as needed. As a substitute for ibuprofen   Wait for the ShingRx vaccine; it's more effective than the current Zostavax vaccine  ( 6 months )   You can fill the tussionex if needed for cough,  And the amoxicillin if you develop symptoms of an ear or sinus infection

## 2016-05-03 DIAGNOSIS — B9789 Other viral agents as the cause of diseases classified elsewhere: Principal | ICD-10-CM

## 2016-05-03 DIAGNOSIS — J069 Acute upper respiratory infection, unspecified: Secondary | ICD-10-CM | POA: Insufficient documentation

## 2016-05-03 NOTE — Assessment & Plan Note (Signed)
Cautioned her against daily use of meloxicam.  Continue PPI

## 2016-05-03 NOTE — Assessment & Plan Note (Signed)
LDL and triglycerides are at goal on simvastatin. She has no side effects and liver enzymes are elevated fur to fatty liver,  But stable.  normal. No changes today.  Lab Results  Component Value Date   CHOL 136 04/20/2016   HDL 23.70 (L) 04/20/2016   LDLCALC 49 01/24/2015   LDLDIRECT 86.0 04/20/2016   TRIG 259.0 (H) 04/20/2016   CHOLHDL 6 04/20/2016

## 2016-05-03 NOTE — Assessment & Plan Note (Signed)
Given her age, the recent contact with the flu, and the impending holiday , I have given her rx's for both tami flu and augmentin with instructions on the conditions which would indicate the need to use both,  Cough suppressant rx given as well

## 2016-05-03 NOTE — Assessment & Plan Note (Signed)
Confirmed with biopsy 2012,  With stage III fibrosis.  She is up to date on vaccines.  Taking statin for management of hyperlipidemia.  Diabetes and obesity addressed with medications and Low GI diet . LFTS are mildly elevated.  Annual ultrasound has been done. She is requesting transfer of care to local liver specialist.  Lab Results  Component Value Date   ALT 62 (H) 04/20/2016   AST 46 (H) 04/20/2016   ALKPHOS 77 04/20/2016   BILITOT 0.5 04/20/2016

## 2016-05-03 NOTE — Assessment & Plan Note (Signed)
well-controlled on current medications . Patient is up to date on  annual eye exam  And has early  Microalbuminuria. She is  tolerating an  ACE/ARB for renal protection and hypertension .   Lab Results  Component Value Date   HGBA1C 6.3 04/20/2016   Lab Results  Component Value Date   MICROALBUR 6.7 (H) 04/20/2016

## 2016-05-03 NOTE — Assessment & Plan Note (Signed)
She is tolerating losartan.  Lab Results  Component Value Date   MICROALBUR 6.7 (H) 04/20/2016   Lab Results  Component Value Date   CREATININE 0.68 04/20/2016

## 2016-05-27 ENCOUNTER — Other Ambulatory Visit: Payer: Self-pay | Admitting: Family Medicine

## 2016-05-27 ENCOUNTER — Ambulatory Visit: Payer: PPO

## 2016-05-27 NOTE — Telephone Encounter (Signed)
Historical medication. Pt last seen 05/01/16. pts last labs 04/20/16. Please advise?

## 2016-05-28 NOTE — Telephone Encounter (Signed)
REFILLED

## 2016-06-01 ENCOUNTER — Ambulatory Visit: Payer: BLUE CROSS/BLUE SHIELD

## 2016-06-05 ENCOUNTER — Ambulatory Visit
Admission: RE | Admit: 2016-06-05 | Discharge: 2016-06-05 | Disposition: A | Discharge status: Home / Self Care | Payer: PPO | Source: Ambulatory Visit | Attending: Urology | Admitting: Urology

## 2016-06-05 DIAGNOSIS — N200 Calculus of kidney: Secondary | ICD-10-CM | POA: Diagnosis not present

## 2016-06-05 DIAGNOSIS — N270 Small kidney, unilateral: Secondary | ICD-10-CM | POA: Diagnosis not present

## 2016-06-05 DIAGNOSIS — K760 Fatty (change of) liver, not elsewhere classified: Secondary | ICD-10-CM | POA: Insufficient documentation

## 2016-06-08 ENCOUNTER — Ambulatory Visit: Payer: BLUE CROSS/BLUE SHIELD | Admitting: General Surgery

## 2016-06-08 ENCOUNTER — Ambulatory Visit: Payer: PPO | Admitting: Urology

## 2016-06-08 ENCOUNTER — Encounter: Payer: Self-pay | Admitting: Urology

## 2016-06-08 VITALS — BP 150/80 | HR 71 | Ht 63.0 in | Wt 196.0 lb

## 2016-06-08 DIAGNOSIS — N200 Calculus of kidney: Secondary | ICD-10-CM

## 2016-06-08 LAB — URINALYSIS, COMPLETE
Bilirubin, UA: NEGATIVE
Glucose, UA: NEGATIVE
Ketones, UA: NEGATIVE
Nitrite, UA: NEGATIVE
Protein, UA: NEGATIVE
RBC, UA: NEGATIVE
Specific Gravity, UA: <1.005 — ABNORMAL LOW (ref 1.005–1.030)
Urobilinogen, Ur: 0.2 mg/dL (ref 0.2–1.0)
pH, UA: 5 (ref 5.0–7.5)

## 2016-06-08 LAB — MICROSCOPIC EXAMINATION

## 2016-06-08 NOTE — Progress Notes (Signed)
06/08/2016 11:59 AM   Kimberly Huff November 01, 1945 [id number]  Referring provider: [name], MD [place], [place] [address]  Chief Complaint  Patient presents with  . Nephrolithiasis    3 month w/renal u/s    HPI: 71 year old female who presents today for follow-up of a left-sided ureteral stone. The patient was treated this past fall for a UVJ stone. She has a history of an atrophic left kidney secondary to a UPJ obstruction which she had repaired at the age of 55.  The patient makes calcium oxalate stones.  Since the patient was last seen, she was doing fine but then did it on some left-sided low back pain. This comes and goes, and is treated with ibuprofen and a heating pad. This seems to make her pain better. She denies any associated nausea and routing. The pain is not near severe as her previous stone  The patient denies any lower urinary tract symptoms including dysuria, gross hematuria, progressive frequency or urgency.     PMH: Past Medical History:  Diagnosis Date  . Allergy   . Atrophic kidney    left  . Blood transfusion   . Blood transfusion without reported diagnosis   . Cataract   . Clotting disorder (HCC)    blood clots in abdomen after surgery  . Coronary artery disease   . Coronary atherosclerosis   . Diabetes mellitus   . Diverticulosis 2005   Dr Jamal Collin  . External hemorrhoids without mention of complication 1031  . GERD (gastroesophageal reflux disease)   . H/O cardiac catheterization 2005  . Heart murmur 2012   found by Dr. Jamal Collin  . Hyperlipidemia   . Hypertension   . Hypopotassemia   . Kidney stone   . Myocardial infarction 2000  . Neuromuscular disorder ([place])    "achy muscles"  . Obesity   . Osteoarthritis   . Recurrent UTI   . Spinal stenosis of cervical region   . Steatohepatitis 05/30/10   Brunt Stage 3, non alcoholic cirrhosis of the liver  . Type II or unspecified type diabetes mellitus without  mention of complication, uncontrolled    Patient does takes Metformin and recommended to stop for 48 hours.  Marland Kitchen Ulcer ([place])   . Unspecified essential hypertension 2014    Surgical History: Past Surgical History:  Procedure Laterality Date  . ABDOMINAL HYSTERECTOMY  1982   complete  . BREAST CYST ASPIRATION Right 1990's   benign  . BREAST MASS EXCISION     benign  . CARDIAC CATHETERIZATION  2000   cardiac stent  . cardiac stents  2000  . CATARACT EXTRACTION     both eyes  . COLONOSCOPY  2004, 2015   Dr. Jamal Collin, Dr. Olevia Perches  . CYSTOSCOPY W/ RETROGRADES Left 11/12/2015   Procedure: CYSTOSCOPY WITH RETROGRADE PYELOGRAM;  Surgeon: Cleon Gustin, MD;  Location: [facility];  Service: Urology;  Laterality: Left;  . CYSTOSCOPY W/ URETERAL STENT PLACEMENT Left 11/12/2015   Procedure: CYSTOSCOPY WITH STENT REPLACEMENT;  Surgeon: Cleon Gustin, MD;  Location: [facility];  Service: Urology;  Laterality: Left;  . CYSTOSCOPY WITH URETEROSCOPY AND STENT PLACEMENT Left 10/15/2015   Procedure: CYSTOSCOPY WITH URETEROSCOPY AND STENT PLACEMENT;  Surgeon: Cleon Gustin, MD;  Location: [facility];  Service: Urology;  Laterality: Left;  . EYE SURGERY Bilateral 2012   cataract  . FOOT SURGERY  2008  . PTCA  2000  . SALPINGOOPHORECTOMY    . TONSILLECTOMY    .  UPPER GI ENDOSCOPY    . URETEROSCOPY WITH HOLMIUM LASER LITHOTRIPSY Left 11/12/2015   Procedure: URETEROSCOPY WITH HOLMIUM LASER LITHOTRIPSY;  Surgeon: Cleon Gustin, MD;  Location: [facility];  Service: Urology;  Laterality: Left;  . URETEROTOMY  1960/1969   x 2 during childhood  . VAGINAL HYSTERECTOMY     PT DENIES ABD HYSTERECTOMY    Home Medications:  Allergies as of 06/08/2016   No Known Allergies     Medication List       Accurate as of 06/08/16 11:59 AM. Always use your most recent med list.          aspirin EC 325 MG tablet Take 325 mg by mouth daily.   furosemide 40 MG tablet Commonly known as:  LASIX TAKE 1 TABLET  BY MOUTH EVERY DAY   ibuprofen 800 MG tablet Commonly known as:  ADVIL,MOTRIN TAKE 1 TABLET BY MOUTH EVERY 8 HOURS AS NEEDED   losartan 50 MG tablet Commonly known as:  COZAAR TAKE ONE TABLET EVERY DAY   metFORMIN 850 MG tablet Commonly known as:  GLUCOPHAGE TAKE ONE TABLET BY MOUTH TWICE DAILY WITH A MEAL   omeprazole 40 MG capsule Commonly known as:  PRILOSEC TAKE 1 CAPSULE EVERY DAY   potassium chloride 10 MEQ tablet Commonly known as:  K-DUR Take by mouth.   simvastatin 40 MG tablet Commonly known as:  ZOCOR TAKE ONE TABLET BY MOUTH EVERY DAY AT 6PM       Allergies: No Known Allergies  Family History: Family History  Problem Relation Age of Onset  . Lung cancer Father   . Heart disease Father   . Diverticulosis Mother   . Diabetes Mother   . Stroke Mother 80  . Autoimmune disease Daughter   . Thyroid disease Daughter   . Thyroid disease Sister   . Stomach cancer Maternal Aunt   . Breast cancer Maternal Aunt   . Colon cancer Neg Hx   . Esophageal cancer Neg Hx   . Rectal cancer Neg Hx     Social History:  reports that she quit smoking about 17 years ago. Her smoking use included Cigarettes. She has never used smokeless tobacco. She reports that she drinks alcohol. She reports that she does not use drugs.  ROS: UROLOGY Frequent Urination?: No Hard to postpone urination?: No Burning/pain with urination?: No Get up at night to urinate?: No Leakage of urine?: No Urine stream starts and stops?: No Trouble starting stream?: No Do you have to strain to urinate?: No Blood in urine?: No Urinary tract infection?: No Sexually transmitted disease?: No Injury to kidneys or bladder?: No Painful intercourse?: No Weak stream?: No Currently pregnant?: No Vaginal bleeding?: No Last menstrual period?: n  Gastrointestinal Nausea?: No Vomiting?: No Indigestion/heartburn?: No Diarrhea?: No Constipation?: No  Constitutional Fever: No Night sweats?:  No Weight loss?: No Fatigue?: No  Skin Skin rash/lesions?: No Itching?: No  Eyes Blurred vision?: No Double vision?: No  Ears/Nose/Throat Sore throat?: No Sinus problems?: No  Hematologic/Lymphatic Swollen glands?: No Easy bruising?: No  Cardiovascular Leg swelling?: No Chest pain?: No  Respiratory Cough?: No Shortness of breath?: No  Endocrine Excessive thirst?: No  Musculoskeletal Back pain?: Yes Joint pain?: No  Neurological Headaches?: No Dizziness?: No  Psychologic Depression?: No Anxiety?: No  Physical Exam: BP (!) 150/80   Pulse 71   Ht 5' 3"  (1.6 m)   Wt 88.9 kg (196 lb)   BMI 34.72 kg/m   Constitutional:  Alert and  oriented, No acute distress. HEENT: [place] AT, moist mucus membranes.  Trachea midline, no masses. Cardiovascular: No clubbing, cyanosis, or edema. Respiratory: Normal respiratory effort, no increased work of breathing. GI: Abdomen is soft, nontender, nondistended, no abdominal masses Skin: No rashes, bruises or suspicious lesions. Lymph: No cervical or inguinal adenopathy. Neurologic: Grossly intact, no focal deficits, moving all 4 extremities. Psychiatric: Normal mood and affect.  Laboratory Data: Lab Results  Component Value Date   WBC 7.0 04/20/2016   HGB 14.3 04/20/2016   HCT 42.6 04/20/2016   MCV 83.3 04/20/2016   PLT 168.0 04/20/2016    Lab Results  Component Value Date   CREATININE 0.68 04/20/2016    No results found for: PSA  No results found for: TESTOSTERONE  Lab Results  Component Value Date   HGBA1C 6.3 04/20/2016    Urinalysis    Component Value Date/Time   COLORURINE Orange (A) 10/23/2015 1651   APPEARANCEUR Clear 11/26/2015 1340   LABSPEC 1.015 10/23/2015 1651   PHURINE 5.5 10/23/2015 1651   GLUCOSEU Trace (A) 11/26/2015 1340   GLUCOSEU 100 (A) 10/23/2015 1651   HGBUR MODERATE (A) 10/23/2015 1651   BILIRUBINUR Negative 11/26/2015 1340   KETONESUR NEGATIVE 10/23/2015 1651   PROTEINUR Trace  (A) 11/26/2015 1340   PROTEINUR NEGATIVE 10/15/2015 1444   UROBILINOGEN 1.0 10/23/2015 1705   UROBILINOGEN 2.0 (A) 10/23/2015 1651   NITRITE Positive (A) 11/26/2015 1340   NITRITE POSITIVE (A) 10/23/2015 1651   LEUKOCYTESUR Trace (A) 11/26/2015 1340    Pertinent Imaging: The ultrasound was performed on 06/05/16, and I have reviewed the images. Distended straights and atrophic left kidney with no hydronephrosis.  Assessment & Plan:  The patient is calcium oxalate stone former, currently her left kidney has returned to normal (atrophic). There are no identifiable stones on ultrasound today.  1. Nephrolithiasis Aware of the ultrasound results with the patient. We also discussed general stone prevention strategies. Our plan at this point is to have the patient follow up on an as-needed basis. - Urinalysis, Complete   Return if symptoms worsen or fail to improve.  Ardis Hughs, [facility] [address], [place], [place] [address] [phone]

## 2016-06-18 ENCOUNTER — Other Ambulatory Visit: Payer: Self-pay | Admitting: Internal Medicine

## 2016-06-25 ENCOUNTER — Ambulatory Visit
Admission: RE | Admit: 2016-06-25 | Discharge: 2016-06-25 | Disposition: A | Discharge status: Home / Self Care | Payer: PPO | Source: Ambulatory Visit | Attending: General Surgery | Admitting: General Surgery

## 2016-06-25 DIAGNOSIS — Z1231 Encounter for screening mammogram for malignant neoplasm of breast: Secondary | ICD-10-CM | POA: Insufficient documentation

## 2016-06-30 ENCOUNTER — Encounter: Payer: Self-pay | Admitting: General Surgery

## 2016-06-30 ENCOUNTER — Ambulatory Visit (INDEPENDENT_AMBULATORY_CARE_PROVIDER_SITE_OTHER): Payer: PPO | Admitting: General Surgery

## 2016-06-30 VITALS — BP 140/76 | HR 70 | Resp 14 | Ht 63.5 in | Wt 196.0 lb

## 2016-06-30 DIAGNOSIS — N6019 Diffuse cystic mastopathy of unspecified breast: Secondary | ICD-10-CM | POA: Diagnosis not present

## 2016-06-30 DIAGNOSIS — Z803 Family history of malignant neoplasm of breast: Secondary | ICD-10-CM

## 2016-06-30 NOTE — Patient Instructions (Signed)
The patient is aware to call back for any questions or concerns.  

## 2016-06-30 NOTE — Progress Notes (Addendum)
Patient ID: Kimberly Huff, female   DOB: Apr 13, 1946, 71 y.o.   MRN: [id number]  Chief Complaint  Patient presents with  . Follow-up    HPI Kimberly Huff is a 71 y.o. female. who presents for a breast evaluation. The most recent mammogram was done on 06-25-16 .  Patient does perform regular self breast checks and gets regular mammograms done. No new breast issues. I have reviewed the history of present illness with the patient.   HPI  Past Medical History:  Diagnosis Date  . Allergy   . Atrophic kidney    left  . Blood transfusion   . Blood transfusion without reported diagnosis   . Cataract   . Clotting disorder (HCC)    blood clots in abdomen after surgery  . Coronary artery disease   . Coronary atherosclerosis   . Diabetes mellitus   . Diverticulosis 2005   Dr Jamal Collin  . External hemorrhoids without mention of complication 1540  . GERD (gastroesophageal reflux disease)   . H/O cardiac catheterization 2005  . Heart murmur 2012   found by Dr. Jamal Collin  . Hyperlipidemia   . Hypertension   . Hypopotassemia   . Kidney stone   . Myocardial infarction 2000  . Neuromuscular disorder ([place])    "achy muscles"  . Obesity   . Osteoarthritis   . Recurrent UTI   . Spinal stenosis of cervical region   . Steatohepatitis 05/30/10   Brunt Stage 3, non alcoholic cirrhosis of the liver  . Type II or unspecified type diabetes mellitus without mention of complication, uncontrolled    Patient does takes Metformin and recommended to stop for 48 hours.  Marland Kitchen Ulcer ([place])   . Unspecified essential hypertension 2014    Past Surgical History:  Procedure Laterality Date  . ABDOMINAL HYSTERECTOMY  1982   complete  . BREAST CYST ASPIRATION Right 1990's   benign  . BREAST MASS EXCISION     benign  . CARDIAC CATHETERIZATION  2000   cardiac stent  . cardiac stents  2000  . CATARACT EXTRACTION     both eyes  . COLONOSCOPY  2004, 2015   Dr. Jamal Collin, Dr. Olevia Perches  . CYSTOSCOPY W/ RETROGRADES Left  11/12/2015   Procedure: CYSTOSCOPY WITH RETROGRADE PYELOGRAM;  Surgeon: Cleon Gustin, MD;  Location: [facility];  Service: Urology;  Laterality: Left;  . CYSTOSCOPY W/ URETERAL STENT PLACEMENT Left 11/12/2015   Procedure: CYSTOSCOPY WITH STENT REPLACEMENT;  Surgeon: Cleon Gustin, MD;  Location: [facility];  Service: Urology;  Laterality: Left;  . CYSTOSCOPY WITH URETEROSCOPY AND STENT PLACEMENT Left 10/15/2015   Procedure: CYSTOSCOPY WITH URETEROSCOPY AND STENT PLACEMENT;  Surgeon: Cleon Gustin, MD;  Location: [facility];  Service: Urology;  Laterality: Left;  . EYE SURGERY Bilateral 2012   cataract  . FOOT SURGERY  2008  . PTCA  2000  . SALPINGOOPHORECTOMY    . TONSILLECTOMY    . UPPER GI ENDOSCOPY    . URETEROSCOPY WITH HOLMIUM LASER LITHOTRIPSY Left 11/12/2015   Procedure: URETEROSCOPY WITH HOLMIUM LASER LITHOTRIPSY;  Surgeon: Cleon Gustin, MD;  Location: [facility];  Service: Urology;  Laterality: Left;  . URETEROTOMY  1960/1969   x 2 during childhood  . VAGINAL HYSTERECTOMY     PT DENIES ABD HYSTERECTOMY    Family History  Problem Relation Age of Onset  . Lung cancer Father   . Heart disease Father   . Diverticulosis Mother   . Diabetes Mother   .  Stroke Mother 13  . Autoimmune disease Daughter   . Thyroid disease Daughter   . Thyroid disease Sister   . Stomach cancer Maternal Aunt   . Breast cancer Maternal Aunt   . Colon cancer Neg Hx   . Esophageal cancer Neg Hx   . Rectal cancer Neg Hx     Social History Social History  Substance Use Topics  . Smoking status: Former Smoker    Types: Cigarettes    Quit date: 09/15/1998  . Smokeless tobacco: Never Used  . Alcohol use Yes     Comment: very rare    No Known Allergies  Current Outpatient Prescriptions  Medication Sig Dispense Refill  . aspirin EC 325 MG tablet Take 325 mg by mouth daily.    Marland Kitchen losartan (COZAAR) 50 MG tablet TAKE ONE TABLET EVERY DAY 30 tablet 5  . metFORMIN (GLUCOPHAGE) 850 MG  tablet TAKE ONE TABLET BY MOUTH TWICE DAILY WITH A MEAL 180 tablet 3  . omeprazole (PRILOSEC) 40 MG capsule TAKE 1 CAPSULE BY MOUTH EVERY DAY 30 capsule 2  . potassium chloride (K-DUR) 10 MEQ tablet Take by mouth.    . simvastatin (ZOCOR) 40 MG tablet TAKE ONE TABLET BY MOUTH EVERY DAY AT 6PM 90 tablet 1  . furosemide (LASIX) 40 MG tablet TAKE 1 TABLET BY MOUTH DAILY 30 tablet 2  . IBU 800 MG tablet TAKE ONE TABLET EVERY EIGHT HOURS AS NEEDED 30 tablet 2   No current facility-administered medications for this visit.     Review of Systems Review of Systems  Constitutional: Negative.   Respiratory: Negative.   Cardiovascular: Negative.     Blood pressure 140/76, pulse 70, resp. rate 14, height 5' 3.5" (1.613 m), weight 196 lb (88.9 kg).  Physical Exam Physical Exam  Constitutional: She is oriented to person, place, and time. She appears well-developed and well-nourished.  HENT:  Mouth/Throat: Oropharynx is clear and moist.  Eyes: Conjunctivae are normal. No scleral icterus.  Neck: Neck supple.  Cardiovascular: Normal rate, regular rhythm and normal heart sounds.   Pulmonary/Chest: Effort normal and breath sounds normal. Right breast exhibits no inverted nipple, no mass, no nipple discharge, no skin change and no tenderness. Left breast exhibits no inverted nipple, no mass, no nipple discharge, no skin change and no tenderness.  Abdominal: Soft. There is no tenderness.  Lymphadenopathy:    She has no cervical adenopathy.    She has no axillary adenopathy.  Neurological: She is alert and oriented to person, place, and time.  Skin: Skin is warm and dry.  Psychiatric: Her behavior is normal.    Data Reviewed  Mammogram reviewed and stable.  Assessment    History of FCD Family history breast cancer.    Plan    Continue annual screening mammograms and breast exams with her primary care, Dr. Derrel Nip.      This information has been scribed by Karie Fetch RN,  BSN,BC.   SANKAR,SEEPLAPUTHUR G 07/14/2016, 3:34 PM

## 2016-07-02 ENCOUNTER — Other Ambulatory Visit: Payer: Self-pay | Admitting: Internal Medicine

## 2016-07-08 ENCOUNTER — Telehealth: Payer: Self-pay | Admitting: Internal Medicine

## 2016-07-08 NOTE — Telephone Encounter (Signed)
Left pt message asking to call Ebony Hail back directly at [phone] to schedule AWV. Thanks!

## 2016-07-09 ENCOUNTER — Other Ambulatory Visit: Payer: Self-pay | Admitting: Internal Medicine

## 2016-08-19 DIAGNOSIS — I25119 Atherosclerotic heart disease of native coronary artery with unspecified angina pectoris: Secondary | ICD-10-CM | POA: Diagnosis not present

## 2016-08-19 DIAGNOSIS — E784 Other hyperlipidemia: Secondary | ICD-10-CM | POA: Diagnosis not present

## 2016-08-19 DIAGNOSIS — K7581 Nonalcoholic steatohepatitis (NASH): Secondary | ICD-10-CM | POA: Diagnosis not present

## 2016-08-19 DIAGNOSIS — E669 Obesity, unspecified: Secondary | ICD-10-CM | POA: Diagnosis not present

## 2016-08-19 DIAGNOSIS — I10 Essential (primary) hypertension: Secondary | ICD-10-CM | POA: Diagnosis not present

## 2016-08-19 DIAGNOSIS — N182 Chronic kidney disease, stage 2 (mild): Secondary | ICD-10-CM | POA: Diagnosis not present

## 2016-08-19 DIAGNOSIS — R011 Cardiac murmur, unspecified: Secondary | ICD-10-CM | POA: Diagnosis not present

## 2016-08-19 DIAGNOSIS — K219 Gastro-esophageal reflux disease without esophagitis: Secondary | ICD-10-CM | POA: Diagnosis not present

## 2016-08-25 ENCOUNTER — Other Ambulatory Visit: Payer: Self-pay | Admitting: Internal Medicine

## 2016-08-26 NOTE — Telephone Encounter (Signed)
Left pt message asking to call Ebony Hail back directly at [phone] to schedule AWV. Thanks!

## 2016-09-12 ENCOUNTER — Other Ambulatory Visit: Payer: Self-pay | Admitting: Internal Medicine

## 2016-10-02 ENCOUNTER — Other Ambulatory Visit: Payer: Self-pay | Admitting: Internal Medicine

## 2016-10-02 DIAGNOSIS — H53021 Refractive amblyopia, right eye: Secondary | ICD-10-CM | POA: Diagnosis not present

## 2016-10-02 DIAGNOSIS — Z9842 Cataract extraction status, left eye: Secondary | ICD-10-CM | POA: Diagnosis not present

## 2016-10-02 DIAGNOSIS — E119 Type 2 diabetes mellitus without complications: Secondary | ICD-10-CM | POA: Diagnosis not present

## 2016-10-02 DIAGNOSIS — H40023 Open angle with borderline findings, high risk, bilateral: Secondary | ICD-10-CM | POA: Diagnosis not present

## 2016-10-02 DIAGNOSIS — H1852 Epithelial (juvenile) corneal dystrophy: Secondary | ICD-10-CM | POA: Diagnosis not present

## 2016-10-02 DIAGNOSIS — Z9841 Cataract extraction status, right eye: Secondary | ICD-10-CM | POA: Diagnosis not present

## 2016-10-02 LAB — HM DIABETES EYE EXAM

## 2016-10-09 ENCOUNTER — Other Ambulatory Visit: Payer: Self-pay | Admitting: Internal Medicine

## 2016-10-27 ENCOUNTER — Other Ambulatory Visit: Payer: Self-pay | Admitting: Internal Medicine

## 2016-10-29 ENCOUNTER — Other Ambulatory Visit: Payer: Self-pay | Admitting: Internal Medicine

## 2016-10-29 NOTE — Telephone Encounter (Signed)
Last OV 2/18 and last lab work 12/17 ok to fill?

## 2016-10-30 NOTE — Telephone Encounter (Signed)
Lab Results  Component Value Date   CREATININE 0.68 04/20/2016   Yes,  I have refilled it at 90/month

## 2016-11-12 ENCOUNTER — Other Ambulatory Visit: Payer: Self-pay | Admitting: Internal Medicine

## 2016-11-16 DIAGNOSIS — I10 Essential (primary) hypertension: Secondary | ICD-10-CM | POA: Diagnosis not present

## 2016-11-16 DIAGNOSIS — H40053 Ocular hypertension, bilateral: Secondary | ICD-10-CM | POA: Diagnosis not present

## 2016-11-16 DIAGNOSIS — H40023 Open angle with borderline findings, high risk, bilateral: Secondary | ICD-10-CM | POA: Diagnosis not present

## 2016-11-18 ENCOUNTER — Other Ambulatory Visit: Payer: Self-pay | Admitting: Internal Medicine

## 2016-11-18 ENCOUNTER — Encounter: Payer: Self-pay | Admitting: Internal Medicine

## 2016-11-18 DIAGNOSIS — M25511 Pain in right shoulder: Secondary | ICD-10-CM

## 2016-11-18 DIAGNOSIS — E1122 Type 2 diabetes mellitus with diabetic chronic kidney disease: Secondary | ICD-10-CM

## 2016-11-18 DIAGNOSIS — N181 Chronic kidney disease, stage 1: Secondary | ICD-10-CM

## 2016-11-18 DIAGNOSIS — R809 Proteinuria, unspecified: Principal | ICD-10-CM

## 2016-11-18 DIAGNOSIS — I10 Essential (primary) hypertension: Secondary | ICD-10-CM

## 2016-11-18 DIAGNOSIS — E1129 Type 2 diabetes mellitus with other diabetic kidney complication: Secondary | ICD-10-CM

## 2016-11-18 DIAGNOSIS — K7581 Nonalcoholic steatohepatitis (NASH): Secondary | ICD-10-CM

## 2016-11-18 NOTE — Progress Notes (Signed)
Lab Results  Component Value Date   HGBA1C 6.3 04/20/2016

## 2016-11-19 ENCOUNTER — Other Ambulatory Visit: Payer: Self-pay | Admitting: Internal Medicine

## 2016-11-19 MED ORDER — TRAMADOL HCL 50 MG PO TABS: 50.0000 mg | ORAL_TABLET | Freq: Three times a day (TID) | ORAL | 0 refills | Status: DC | PRN

## 2016-11-19 NOTE — Progress Notes (Signed)
ramdol

## 2016-11-24 ENCOUNTER — Other Ambulatory Visit: Payer: Self-pay | Admitting: Internal Medicine

## 2016-12-21 DIAGNOSIS — M7521 Bicipital tendinitis, right shoulder: Secondary | ICD-10-CM | POA: Diagnosis not present

## 2016-12-21 DIAGNOSIS — M25511 Pain in right shoulder: Secondary | ICD-10-CM | POA: Diagnosis not present

## 2016-12-21 DIAGNOSIS — M47812 Spondylosis without myelopathy or radiculopathy, cervical region: Secondary | ICD-10-CM | POA: Diagnosis not present

## 2016-12-21 DIAGNOSIS — M542 Cervicalgia: Secondary | ICD-10-CM | POA: Diagnosis not present

## 2016-12-22 ENCOUNTER — Other Ambulatory Visit (INDEPENDENT_AMBULATORY_CARE_PROVIDER_SITE_OTHER): Payer: PPO

## 2016-12-22 DIAGNOSIS — R809 Proteinuria, unspecified: Secondary | ICD-10-CM

## 2016-12-22 DIAGNOSIS — K7581 Nonalcoholic steatohepatitis (NASH): Secondary | ICD-10-CM | POA: Diagnosis not present

## 2016-12-22 DIAGNOSIS — E1122 Type 2 diabetes mellitus with diabetic chronic kidney disease: Secondary | ICD-10-CM

## 2016-12-22 DIAGNOSIS — E1129 Type 2 diabetes mellitus with other diabetic kidney complication: Secondary | ICD-10-CM | POA: Diagnosis not present

## 2016-12-22 DIAGNOSIS — N181 Chronic kidney disease, stage 1: Secondary | ICD-10-CM | POA: Diagnosis not present

## 2016-12-22 LAB — COMPREHENSIVE METABOLIC PANEL
ALT: 80 U/L — ABNORMAL HIGH (ref 0–35)
AST: 73 U/L — ABNORMAL HIGH (ref 0–37)
Albumin: 4.2 g/dL (ref 3.5–5.2)
Alkaline Phosphatase: 67 U/L (ref 39–117)
BUN: 20 mg/dL (ref 6–23)
CO2: 31 mEq/L (ref 19–32)
Calcium: 10 mg/dL (ref 8.4–10.5)
Chloride: 101 mEq/L (ref 96–112)
Creatinine, Ser: 0.74 mg/dL (ref 0.40–1.20)
GFR: 81.69 mL/min (ref >60.00)
Glucose, Bld: 174 mg/dL — ABNORMAL HIGH (ref 70–99)
Potassium: 4.8 mEq/L (ref 3.5–5.1)
Sodium: 138 mEq/L (ref 135–145)
Total Bilirubin: 0.8 mg/dL (ref 0.2–1.2)
Total Protein: 7.3 g/dL (ref 6.0–8.3)

## 2016-12-22 LAB — LIPID PANEL
Cholesterol: 120 mg/dL (ref 0–200)
HDL: 22.1 mg/dL — ABNORMAL LOW (ref >39.00)
NonHDL: 97.65
Total CHOL/HDL Ratio: 5
Triglycerides: 263 mg/dL — ABNORMAL HIGH (ref 0.0–149.0)
VLDL: 52.6 mg/dL — ABNORMAL HIGH (ref 0.0–40.0)

## 2016-12-22 LAB — MICROALBUMIN / CREATININE URINE RATIO
Creatinine,U: 129.5 mg/dL
Microalb Creat Ratio: 6.8 mg/g (ref 0.0–30.0)
Microalb, Ur: 8.8 mg/dL — ABNORMAL HIGH (ref 0.0–1.9)

## 2016-12-22 LAB — HEMOGLOBIN A1C: Hgb A1c MFr Bld: 6.9 % — ABNORMAL HIGH (ref 4.6–6.5)

## 2016-12-22 LAB — LDL CHOLESTEROL, DIRECT: Direct LDL: 82 mg/dL

## 2016-12-24 ENCOUNTER — Encounter: Payer: Self-pay | Admitting: Internal Medicine

## 2016-12-24 ENCOUNTER — Ambulatory Visit (INDEPENDENT_AMBULATORY_CARE_PROVIDER_SITE_OTHER): Payer: PPO | Admitting: Internal Medicine

## 2016-12-24 DIAGNOSIS — E1129 Type 2 diabetes mellitus with other diabetic kidney complication: Secondary | ICD-10-CM

## 2016-12-24 DIAGNOSIS — I252 Old myocardial infarction: Secondary | ICD-10-CM

## 2016-12-24 DIAGNOSIS — E785 Hyperlipidemia, unspecified: Secondary | ICD-10-CM

## 2016-12-24 DIAGNOSIS — N3941 Urge incontinence: Secondary | ICD-10-CM

## 2016-12-24 DIAGNOSIS — M25511 Pain in right shoulder: Secondary | ICD-10-CM

## 2016-12-24 DIAGNOSIS — N289 Disorder of kidney and ureter, unspecified: Secondary | ICD-10-CM

## 2016-12-24 DIAGNOSIS — R809 Proteinuria, unspecified: Secondary | ICD-10-CM | POA: Diagnosis not present

## 2016-12-24 DIAGNOSIS — K7581 Nonalcoholic steatohepatitis (NASH): Secondary | ICD-10-CM | POA: Diagnosis not present

## 2016-12-24 DIAGNOSIS — N181 Chronic kidney disease, stage 1: Secondary | ICD-10-CM | POA: Diagnosis not present

## 2016-12-24 DIAGNOSIS — I10 Essential (primary) hypertension: Secondary | ICD-10-CM

## 2016-12-24 DIAGNOSIS — E1122 Type 2 diabetes mellitus with diabetic chronic kidney disease: Secondary | ICD-10-CM | POA: Diagnosis not present

## 2016-12-24 DIAGNOSIS — E1169 Type 2 diabetes mellitus with other specified complication: Secondary | ICD-10-CM

## 2016-12-24 MED ORDER — ROSUVASTATIN CALCIUM 10 MG PO TABS: 10.0000 mg | ORAL_TABLET | Freq: Every day | ORAL | 2 refills | Status: DC

## 2016-12-24 MED ORDER — ZOSTER VAC RECOMB ADJUVANTED 50 MCG/0.5ML IM SUSR: 0.5000 mL | Freq: Once | INTRAMUSCULAR | 1 refills | Status: AC

## 2016-12-24 NOTE — Progress Notes (Signed)
Subjective:  Patient ID: Kimberly Huff, female    DOB: 1945-12-05  Age: 71 y.o. MRN: [id number]  CC: Diagnoses of Urge incontinence of urine, Controlled type 2 diabetes mellitus with stage 1 chronic kidney disease, without long-term current use of insulin (HCC), Nonfunctioning left kidney, NASH (nonalcoholic steatohepatitis), Nontraumatic shoulder pain, right, History of myocardial infarction, Microalbuminuria due to type 2 diabetes mellitus ([place]), Benign essential HTN, and Dyslipidemia associated with type 2 diabetes mellitus ([place]) were pertinent to this visit.  HPI VIRTIE BUNGERT presents for follow up on chronic conditions including type 2 diabetes mellitus with fatty liver,  CAD .  Right shoulder treated by Dr Nicholaus Bloom PA . Bones spurs,  Arthritis and inflammation . Patient refused I/A steroid injection due to fear of hyperglycemia   Has been Retired for the past year   Has gained 9 lbs since last visit, attributed  due to the stress of mother's death.   Has started walking in the mall twice around the mall. No claudication or chest pain with exertion.  Gets short of breath with one flight of stairs.    Not sleeping well.  Wake s up exhausted,  Family notes that she snores very loudly and wakes up frequently.  Falling alseep during the day if she stops moving. Marland Kitchen   No prior study   Left middle toe fractured likely  Wants to reduce kher diuretic  to every other day due to urinary urge incontinence. History of nonfunctioning left kidney, per nephrologist Dr Aline August in [place].   Has chornic bowel issues  ,  described as  frequent solid stools.  Discussed change in medication from metformin to jardiance or to [place] ,  after long discussion she prefers to continue metformin for 3 more months and will modify diet   40 minute visit s of face to face time was spent with patient today due to discussion  and management of multiple issues    Outpatient Medications Prior to Visit    Medication Sig Dispense Refill  . aspirin EC 325 MG tablet Take 325 mg by mouth daily.    . furosemide (LASIX) 40 MG tablet TAKE ONE TABLET BY MOUTH EVERY DAY 30 tablet 0  . ibuprofen (ADVIL,MOTRIN) 800 MG tablet TAKE ONE TABLET BY MOUTH EVERY EIGHT HOURS AS NEEDED 90 tablet 2  . losartan (COZAAR) 50 MG tablet TAKE ONE TABLET BY MOUTH EVERY DAY 30 tablet 0  . metFORMIN (GLUCOPHAGE) 850 MG tablet TAKE ONE TABLET BY MOUTH TWICE DAILY WITH A MEAL 180 tablet 3  . omeprazole (PRILOSEC) 40 MG capsule TAKE 1 CAPSULE BY MOUTH EVERY DAY 30 capsule 4  . potassium chloride (K-DUR) 10 MEQ tablet Take by mouth.    . potassium chloride (K-DUR) 10 MEQ tablet TAKE ONE TABLET EVERY DAY 90 tablet 0  . simvastatin (ZOCOR) 40 MG tablet TAKE ONE TABLET BY MOUTH EVERY DAY AT 6PM 90 tablet 1  . traMADol (ULTRAM) 50 MG tablet Take 1 tablet (50 mg total) by mouth every 8 (eight) hours as needed. (Patient not taking: Reported on 12/24/2016) 30 tablet 0   No facility-administered medications prior to visit.     Review of Systems;  Patient denies headache, fevers, malaise, unintentional weight loss, skin rash, eye pain, sinus congestion and sinus pain, sore throat, dysphagia,  hemoptysis , cough, dyspnea, wheezing, chest pain, palpitations, orthopnea, edema, abdominal pain, nausea, melena, diarrhea, constipation, flank pain, dysuria, hematuria, urinary  Frequency, nocturia, numbness, tingling, seizures,  Focal weakness, Loss  of consciousness,  Tremor, insomnia, depression, anxiety, and suicidal ideation.      Objective:  BP 132/80 (BP Location: Left Arm, Patient Position: Sitting, Cuff Size: Large)   Pulse 79   Temp 99 F (37.2 C) (Oral)   Resp 15   Ht 5' 3.5" (1.613 m)   Wt 202 lb 3.2 oz (91.7 kg)   SpO2 94%   BMI 35.26 kg/m   BP Readings from Last 3 Encounters:  12/24/16 132/80  06/30/16 140/76  06/08/16 (!) 150/80    Wt Readings from Last 3 Encounters:  12/24/16 202 lb 3.2 oz (91.7 kg)  06/30/16 196  lb (88.9 kg)  06/08/16 196 lb (88.9 kg)    General appearance: alert, cooperative and appears stated age Ears: normal TM's and external ear canals both ears Throat: lips, mucosa, and tongue normal; teeth and gums normal Neck: no adenopathy, no carotid bruit, supple, symmetrical, trachea midline and thyroid not enlarged, symmetric, no tenderness/mass/nodules Back: symmetric, no curvature. ROM normal. No CVA tenderness. Lungs: clear to auscultation bilaterally Heart: regular rate and rhythm, S1, S2 normal, no murmur, click, rub or gallop Abdomen: soft, non-tender; bowel sounds normal; no masses,  no organomegaly Pulses: 2+ and symmetric Skin: Skin color, texture, turgor normal. No rashes or lesions Lymph nodes: Cervical, supraclavicular, and axillary nodes normal.  Lab Results  Component Value Date   HGBA1C 6.9 (H) 12/22/2016   HGBA1C 6.3 04/20/2016   HGBA1C 6.2 12/18/2015    Lab Results  Component Value Date   CREATININE 0.74 12/22/2016   CREATININE 0.68 04/20/2016   CREATININE 0.86 12/18/2015    Lab Results  Component Value Date   WBC 7.0 04/20/2016   HGB 14.3 04/20/2016   HCT 42.6 04/20/2016   PLT 168.0 04/20/2016   GLUCOSE 174 (H) 12/22/2016   CHOL 120 12/22/2016   TRIG 263.0 (H) 12/22/2016   HDL 22.10 (L) 12/22/2016   LDLDIRECT 82.0 12/22/2016   LDLCALC 49 01/24/2015   ALT 80 (H) 12/22/2016   AST 73 (H) 12/22/2016   NA 138 12/22/2016   K 4.8 12/22/2016   CL 101 12/22/2016   CREATININE 0.74 12/22/2016   BUN 20 12/22/2016   CO2 31 12/22/2016   TSH 0.76 09/10/2014   INR 1.0 09/10/2014   HGBA1C 6.9 (H) 12/22/2016   MICROALBUR 8.8 (H) 12/22/2016    Mm Digital Screening  Result Date: 06/25/2016 CLINICAL DATA:  Screening. EXAM: DIGITAL SCREENING BILATERAL MAMMOGRAM WITH CAD COMPARISON:  Previous exam(s). ACR Breast Density Category c: The breast tissue is heterogeneously dense, which may obscure small masses. FINDINGS: There are no findings suspicious for  malignancy. Images were processed with CAD. IMPRESSION: No mammographic evidence of malignancy. A result letter of this screening mammogram will be mailed directly to the patient. RECOMMENDATION: Screening mammogram in one year. (Code:SM-B-01Y) BI-RADS CATEGORY  1: Negative. Electronically Signed   By: Franki Cabot M.D.   On: 06/25/2016 16:13    Assessment & Plan:   Problem List Items Addressed This Visit    Benign essential HTN    D/c daily diuretic today,  Will add beta blocker at follow up given  history of CAD       Relevant Medications   rosuvastatin (CRESTOR) 10 MG tablet   Dyslipidemia associated with type 2 diabetes mellitus ([place])    LDL and triglycerides are elevated on  simvastatin. She has no side effects and liver enzymes are elevated due to fatty liver,  But stable. Changing medication to generic Crestor  Lab Results  Component Value Date   CHOL 120 12/22/2016   HDL 22.10 (L) 12/22/2016   LDLCALC 49 01/24/2015   LDLDIRECT 82.0 12/22/2016   TRIG 263.0 (H) 12/22/2016   CHOLHDL 5 12/22/2016           Relevant Medications   rosuvastatin (CRESTOR) 10 MG tablet   History of myocardial infarction    Occurred in 2000,  S/p PCI/stent.  No history of systolic dysfunction  .  Taking statin, asa, ARB.will consider adding a beta blocker at next visit        Microalbuminuria due to type 2 diabetes mellitus ([place])    Managed with losartan       Relevant Medications   rosuvastatin (CRESTOR) 10 MG tablet   NASH (nonalcoholic steatohepatitis)    Confirmed with biopsy 2012,  With stage III fibrosis.  She is up to date on vaccines.  Taking statin for management of hyperlipidemia.  Diabetes and obesity addressed with medications and Low GI diet . LFTS are mildly elevated.  Annual ultrasound has been done.   Lab Results  Component Value Date   ALT 80 (H) 12/22/2016   AST 73 (H) 12/22/2016   ALKPHOS 67 12/22/2016   BILITOT 0.8 12/22/2016           Nonfunctioning left  kidney (Chronic)    With hydronephrosis secondary to obstructing stone..  Renal function is normal .      Nontraumatic shoulder pain, right    With subacromial joint space narrowing and spurring  . She has been treated by Dr Roland Rack,  But deferred I/A steroid injection due to fear of hyperglycemia      Type 2 diabetes mellitus, controlled, with renal complications ([place])      well-controlled on current medications . Patient is up to date on  annual eye exam  And has early  Microalbuminuria. She is  tolerating an  ACE/ARB for renal protection and hypertension . Discussed adding jardiance,  But given her current complaints of urinary incontinence,  Will defer change for now.   Lab Results  Component Value Date   HGBA1C 6.9 (H) 12/22/2016   Lab Results  Component Value Date   MICROALBUR 8.8 (H) 12/22/2016             Relevant Medications   rosuvastatin (CRESTOR) 10 MG tablet   Urge incontinence of urine    Aggravated by daily use of lasix with no known history of systolic dysfunction documented by cardiology .  Reducing lasix to prn use.          I have discontinued Ms. Kassing's simvastatin. I am also having her start on rosuvastatin and Zoster Vac Recomb Adjuvanted. Additionally, I am having her maintain her aspirin EC, potassium chloride, metFORMIN, potassium chloride, ibuprofen, omeprazole, traMADol, furosemide, losartan, and Cinnamon.  Meds ordered this encounter  Medications  . Cinnamon 500 MG capsule    Sig: Take by mouth.  . rosuvastatin (CRESTOR) 10 MG tablet    Sig: Take 1 tablet (10 mg total) by mouth daily.    Dispense:  30 tablet    Refill:  2  . Zoster Vac Recomb Adjuvanted [facility]) injection    Sig: Inject 0.5 mLs into the muscle once.    Dispense:  1 each    Refill:  1    Medications Discontinued During This Encounter  Medication Reason  . simvastatin (ZOCOR) 40 MG tablet     Follow-up: Return in about 3 months (around 03/26/2017).  [name], MD

## 2016-12-24 NOTE — Patient Instructions (Addendum)
Only take the potassium on the days you take the lasix  Weight yourself daily and if the scale goes up by 2 lbs overnight,  You need to take the lasix   Your diabetes is still under good control on current regimen, but your A1c has risen to 6.9 and your LDL has risen as well   No change to  your metformin is needed but I want you to go for a WALK every day for 30 minutes  I have changed your simvastatin to generic crestor 10 mg daily to lower your LDL and triglycerides

## 2016-12-25 ENCOUNTER — Encounter: Payer: Self-pay | Admitting: Internal Medicine

## 2016-12-26 NOTE — Assessment & Plan Note (Signed)
D/c daily diuretic today,  Will add beta blocker at follow up given  history of CAD

## 2016-12-26 NOTE — Assessment & Plan Note (Signed)
Aggravated by daily use of lasix with no known history of systolic dysfunction documented by cardiology .  Reducing lasix to prn use.

## 2016-12-26 NOTE — Assessment & Plan Note (Addendum)
well-controlled on current medications . Patient is up to date on  annual eye exam  And has early  Microalbuminuria. She is  tolerating an  ACE/ARB for renal protection and hypertension . Discussed adding jardiance,  But given her current complaints of urinary incontinence,  Will defer change for now.   Lab Results  Component Value Date   HGBA1C 6.9 (H) 12/22/2016   Lab Results  Component Value Date   MICROALBUR 8.8 (H) 12/22/2016

## 2016-12-26 NOTE — Assessment & Plan Note (Signed)
Confirmed with biopsy 2012,  With stage III fibrosis.  She is up to date on vaccines.  Taking statin for management of hyperlipidemia.  Diabetes and obesity addressed with medications and Low GI diet . LFTS are mildly elevated.  Annual ultrasound has been done.   Lab Results  Component Value Date   ALT 80 (H) 12/22/2016   AST 73 (H) 12/22/2016   ALKPHOS 67 12/22/2016   BILITOT 0.8 12/22/2016

## 2016-12-26 NOTE — Assessment & Plan Note (Signed)
With hydronephrosis secondary to obstructing stone..  Renal function is normal .

## 2016-12-26 NOTE — Assessment & Plan Note (Signed)
LDL and triglycerides are elevated on  simvastatin. She has no side effects and liver enzymes are elevated due to fatty liver,  But stable. Changing medication to generic Crestor   Lab Results  Component Value Date   CHOL 120 12/22/2016   HDL 22.10 (L) 12/22/2016   LDLCALC 49 01/24/2015   LDLDIRECT 82.0 12/22/2016   TRIG 263.0 (H) 12/22/2016   CHOLHDL 5 12/22/2016

## 2016-12-26 NOTE — Assessment & Plan Note (Signed)
Managed with losartan

## 2016-12-26 NOTE — Assessment & Plan Note (Addendum)
With subacromial joint space narrowing and spurring  . She has been treated by Dr Roland Rack,  But deferred I/A steroid injection due to fear of hyperglycemia

## 2016-12-26 NOTE — Assessment & Plan Note (Signed)
Occurred in 2000,  S/p PCI/stent.  No history of systolic dysfunction  .  Taking statin, asa, ARB.will consider adding a beta blocker at next visit

## 2016-12-30 ENCOUNTER — Other Ambulatory Visit: Payer: Self-pay | Admitting: Internal Medicine

## 2017-01-15 ENCOUNTER — Other Ambulatory Visit: Payer: Self-pay | Admitting: Internal Medicine

## 2017-02-15 ENCOUNTER — Ambulatory Visit (INDEPENDENT_AMBULATORY_CARE_PROVIDER_SITE_OTHER): Payer: PPO | Admitting: Family

## 2017-02-15 ENCOUNTER — Encounter: Payer: Self-pay | Admitting: Family

## 2017-02-15 VITALS — BP 134/80 | HR 88 | Temp 99.7°F | Ht 63.5 in

## 2017-02-15 DIAGNOSIS — J0110 Acute frontal sinusitis, unspecified: Secondary | ICD-10-CM

## 2017-02-15 DIAGNOSIS — R6889 Other general symptoms and signs: Secondary | ICD-10-CM | POA: Diagnosis not present

## 2017-02-15 DIAGNOSIS — N289 Disorder of kidney and ureter, unspecified: Secondary | ICD-10-CM

## 2017-02-15 DIAGNOSIS — H6122 Impacted cerumen, left ear: Secondary | ICD-10-CM | POA: Diagnosis not present

## 2017-02-15 LAB — URINALYSIS, ROUTINE W REFLEX MICROSCOPIC
Ketones, ur: NEGATIVE
Nitrite: POSITIVE — AB
Specific Gravity, Urine: 1.025 (ref 1.000–1.030)
Total Protein, Urine: 30 — AB
Urine Glucose: NEGATIVE
Urobilinogen, UA: 2 — AB (ref 0.0–1.0)
pH: 6 (ref 5.0–8.0)

## 2017-02-15 LAB — POC INFLUENZA A&B (BINAX/QUICKVUE)
Influenza A, POC: NEGATIVE
Influenza B, POC: NEGATIVE

## 2017-02-15 MED ORDER — AMOXICILLIN-POT CLAVULANATE 875-125 MG PO TABS: 1.0000 | ORAL_TABLET | Freq: Two times a day (BID) | ORAL | 0 refills | Status: AC

## 2017-02-15 NOTE — Progress Notes (Signed)
Pre visit review using our clinic review tool, if applicable. No additional management support is needed unless otherwise documented below in the visit note. 

## 2017-02-15 NOTE — Assessment & Plan Note (Signed)
Febrile. Improving. Patient is well-appearing. Her preference to go ahead and start treatment with Augmentin today. Return precautions given.

## 2017-02-15 NOTE — Progress Notes (Signed)
Subjective:    Patient ID: Kimberly Huff, female    DOB: 07/08/1945, 71 y.o.   MRN: [id number]  CC: Kimberly Huff is a 71 y.o. female who presents today for an acute visit.    HPI: CC: chills x one day, tmax 100.0 yesertday, improved , temperature controlled on tyelonol. One week ago ear started to hurt, feeling better. felt nauseated last week as well, resolved. Left ear feels clogged. Tried allegra , ibuprofen with some relief of ear pain.  NO cough, HA.  Does have 'twinge in left low back' and would like urine checked.  No dysuria, urinary frequency, abdominal pain.   Eating normally and drinking water.   No lung disease  Left kidney only functions at 10 %. H/o renal stones 2017. Left renal stent which has since been removed.     HISTORY:  Past Medical History:  Diagnosis Date  . Allergy   . Atrophic kidney    left  . Blood transfusion   . Blood transfusion without reported diagnosis   . Cataract   . Clotting disorder (HCC)    blood clots in abdomen after surgery  . Coronary artery disease   . Coronary atherosclerosis   . Diabetes mellitus   . Diverticulosis 2005   Dr Jamal Collin  . External hemorrhoids without mention of complication 6226  . GERD (gastroesophageal reflux disease)   . H/O cardiac catheterization 2005  . Heart murmur 2012   found by Dr. Jamal Collin  . Hyperlipidemia   . Hypertension   . Hypopotassemia   . Kidney stone   . Myocardial infarction ([place]) 2000  . Neuromuscular disorder ([place])    "achy muscles"  . Obesity   . Osteoarthritis   . Recurrent UTI   . Spinal stenosis of cervical region   . Steatohepatitis 05/30/10   Brunt Stage 3, non alcoholic cirrhosis of the liver  . Type II or unspecified type diabetes mellitus without mention of complication, uncontrolled    Patient does takes Metformin and recommended to stop for 48 hours.  Marland Kitchen Ulcer   . Unspecified essential hypertension 2014   Past Surgical History:  Procedure Laterality Date  .  ABDOMINAL HYSTERECTOMY  1982   complete  . BREAST CYST ASPIRATION Right 1990's   benign  . BREAST MASS EXCISION     benign  . CARDIAC CATHETERIZATION  2000   cardiac stent  . cardiac stents  2000  . CATARACT EXTRACTION     both eyes  . COLONOSCOPY  2004, 2015   Dr. Jamal Collin, Dr. Olevia Perches  . CYSTOSCOPY W/ RETROGRADES Left 11/12/2015   Procedure: CYSTOSCOPY WITH RETROGRADE PYELOGRAM;  Surgeon: Cleon Gustin, MD;  Location: [facility];  Service: Urology;  Laterality: Left;  . CYSTOSCOPY W/ URETERAL STENT PLACEMENT Left 11/12/2015   Procedure: CYSTOSCOPY WITH STENT REPLACEMENT;  Surgeon: Cleon Gustin, MD;  Location: [facility];  Service: Urology;  Laterality: Left;  . CYSTOSCOPY WITH URETEROSCOPY AND STENT PLACEMENT Left 10/15/2015   Procedure: CYSTOSCOPY WITH URETEROSCOPY AND STENT PLACEMENT;  Surgeon: Cleon Gustin, MD;  Location: [facility];  Service: Urology;  Laterality: Left;  . EYE SURGERY Bilateral 2012   cataract  . FOOT SURGERY  2008  . PTCA  2000  . SALPINGOOPHORECTOMY    . TONSILLECTOMY    . UPPER GI ENDOSCOPY    . URETEROSCOPY WITH HOLMIUM LASER LITHOTRIPSY Left 11/12/2015   Procedure: URETEROSCOPY WITH HOLMIUM LASER LITHOTRIPSY;  Surgeon: Cleon Gustin, MD;  Location: [facility];  Service: Urology;  Laterality: Left;  . URETEROTOMY  1960/1969   x 2 during childhood  . VAGINAL HYSTERECTOMY     PT DENIES ABD HYSTERECTOMY   Family History  Problem Relation Age of Onset  . Lung cancer Father   . Heart disease Father   . Diverticulosis Mother   . Diabetes Mother   . Stroke Mother 39  . Autoimmune disease Daughter   . Thyroid disease Daughter   . Thyroid disease Sister   . Stomach cancer Maternal Aunt   . Breast cancer Maternal Aunt   . Colon cancer Neg Hx   . Esophageal cancer Neg Hx   . Rectal cancer Neg Hx     Allergies: Patient has no known allergies. Current Outpatient Prescriptions on File Prior to Visit  Medication Sig Dispense Refill  . aspirin EC  325 MG tablet Take 325 mg by mouth daily.    . Cinnamon 500 MG capsule Take by mouth.    . furosemide (LASIX) 40 MG tablet TAKE 1 TABLET BY MOUTH EVERY DAY 30 tablet 3  . ibuprofen (ADVIL,MOTRIN) 800 MG tablet TAKE 1 TABLET BY MOUTH EVERY 8 HOURS AS NEEDED 90 tablet 1  . losartan (COZAAR) 50 MG tablet TAKE 1 TABLET BY MOUTH EVERY DAY 30 tablet 3  . metFORMIN (GLUCOPHAGE) 850 MG tablet TAKE ONE TABLET BY MOUTH TWICE DAILY WITH A MEAL 180 tablet 3  . omeprazole (PRILOSEC) 40 MG capsule TAKE 1 CAPSULE BY MOUTH EVERY DAY 30 capsule 4  . potassium chloride (K-DUR) 10 MEQ tablet Take by mouth.    . potassium chloride (K-DUR) 10 MEQ tablet TAKE ONE TABLET EVERY DAY 90 tablet 0  . rosuvastatin (CRESTOR) 10 MG tablet Take 1 tablet (10 mg total) by mouth daily. 30 tablet 2  . traMADol (ULTRAM) 50 MG tablet Take 1 tablet (50 mg total) by mouth every 8 (eight) hours as needed. 30 tablet 0   No current facility-administered medications on file prior to visit.     Social History  Substance Use Topics  . Smoking status: Former Smoker    Types: Cigarettes    Quit date: 09/15/1998  . Smokeless tobacco: Never Used  . Alcohol use Yes     Comment: very rare    Review of Systems  Constitutional: Positive for chills and fever.  HENT: Positive for congestion, ear pain and sinus pressure. Negative for ear discharge, hearing loss and sinus pain.   Respiratory: Negative for cough.   Cardiovascular: Negative for chest pain and palpitations.  Gastrointestinal: Negative for abdominal pain, nausea and vomiting.  Genitourinary: Negative for dysuria, flank pain and frequency.      Objective:    BP 134/80   Pulse 88   Temp 99.7 F (37.6 C) (Oral)   Ht 5' 3.5" (1.613 m)   SpO2 93%    Physical Exam  Constitutional: She appears well-developed and well-nourished.  HENT:  Head: Normocephalic and atraumatic.  Right Ear: Hearing, tympanic membrane, external ear and ear canal normal. No drainage, swelling or  tenderness. No foreign bodies. Tympanic membrane is not erythematous and not bulging. No middle ear effusion. No decreased hearing is noted.  Left Ear: Hearing, tympanic membrane, external ear and ear canal normal. No drainage, swelling or tenderness. No foreign bodies. Tympanic membrane is not erythematous and not bulging.  No middle ear effusion. No decreased hearing is noted.  Nose: Nose normal. No rhinorrhea. Right sinus exhibits no maxillary sinus tenderness and no frontal sinus tenderness. Left sinus  exhibits no maxillary sinus tenderness and no frontal sinus tenderness.  Mouth/Throat: Uvula is midline, oropharynx is clear and moist and mucous membranes are normal. No oropharyngeal exudate, posterior oropharyngeal edema, posterior oropharyngeal erythema or tonsillar abscesses.  Left ear cerumen impaction  Eyes: Conjunctivae are normal.  Cardiovascular: Normal rate, regular rhythm, normal heart sounds and normal pulses.   Pulmonary/Chest: Effort normal and breath sounds normal. She has no wheezes. She has no rhonchi. She has no rales.  Abdominal: There is no CVA tenderness.  Lymphadenopathy:       Head (right side): No submental, no submandibular, no tonsillar, no preauricular, no posterior auricular and no occipital adenopathy present.       Head (left side): No submental, no submandibular, no tonsillar, no preauricular, no posterior auricular and no occipital adenopathy present.    She has no cervical adenopathy.  Neurological: She is alert.  Skin: Skin is warm and dry.  Psychiatric: She has a normal mood and affect. Her speech is normal and behavior is normal. Thought content normal.  Vitals reviewed. Left cerumen impaction, resolved with irrigation by CMA in both left ear.  After which, I also examined patient and the EAC and TM are clear. Patient tolerated procedure well.      Assessment & Plan:   Problem List Items Addressed This Visit      Respiratory   Acute non-recurrent  frontal sinusitis - Primary    Febrile. Improving. Patient is well-appearing. Her preference to go ahead and start treatment with Augmentin today. Return precautions given.       Relevant Medications   amoxicillin-clavulanate (AUGMENTIN) 875-125 MG tablet   Other Relevant Orders   POC Influenza A&B (Binax test) (Completed)     Nervous and Auditory   Impacted cerumen of left ear    Resolved.         Genitourinary   Nonfunctioning left kidney (Chronic)    No overt urinary symptoms however patient report she had fever and chills with last renal stone. No cva tenderness today. Pending urine studies.       Relevant Orders   Urinalysis, Routine w reflex microscopic   CULTURE, URINE COMPREHENSIVE        I am having Ms. Nachtigal start on amoxicillin-clavulanate. I am also having her maintain her aspirin EC, potassium chloride, metFORMIN, potassium chloride, omeprazole, traMADol, Cinnamon, rosuvastatin, losartan, furosemide, and ibuprofen.   Meds ordered this encounter  Medications  . amoxicillin-clavulanate (AUGMENTIN) 875-125 MG tablet    Sig: Take 1 tablet by mouth 2 (two) times daily.    Dispense:  14 tablet    Refill:  0    Order Specific Question:   Supervising Provider    Answer:   [name] [[id number]]    Return precautions given.   Risks, benefits, and alternatives of the medications and treatment plan prescribed today were discussed, and patient expressed understanding.   Education regarding symptom management and diagnosis given to patient on AVS.  Continue to follow with [name], MD for routine health maintenance.   Kimberly Huff and I agreed with plan.   Mable Paris, FNP

## 2017-02-15 NOTE — Patient Instructions (Signed)
Start augmentin  Ensure to take probiotics while on antibiotics and also for 2 weeks after completion. It is important to re-colonize the gut with good bacteria and also to prevent any diarrheal infections associated with antibiotic use.   Let us know if dont' continue to get better  Pending urine culture  If there is no improvement in your symptoms, or if there is any worsening of symptoms, or if you have any additional concerns, please return for re-evaluation; or, if we are closed, consider going to the Emergency Room for evaluation if symptoms urgent.

## 2017-02-15 NOTE — Assessment & Plan Note (Signed)
Resolved

## 2017-02-15 NOTE — Assessment & Plan Note (Signed)
No overt urinary symptoms however patient report she had fever and chills with last renal stone. No cva tenderness today. Pending urine studies.

## 2017-02-17 LAB — CULTURE, URINE COMPREHENSIVE
MICRO NUMBER:: 81146380
SPECIMEN QUALITY:: ADEQUATE

## 2017-02-22 ENCOUNTER — Encounter: Payer: Self-pay | Admitting: Family

## 2017-02-24 ENCOUNTER — Other Ambulatory Visit: Payer: Self-pay | Admitting: Internal Medicine

## 2017-03-17 ENCOUNTER — Other Ambulatory Visit: Payer: Self-pay | Admitting: Internal Medicine

## 2017-03-18 ENCOUNTER — Other Ambulatory Visit: Payer: Self-pay | Admitting: Internal Medicine

## 2017-04-16 ENCOUNTER — Other Ambulatory Visit: Payer: Self-pay | Admitting: Internal Medicine

## 2017-05-06 ENCOUNTER — Other Ambulatory Visit: Payer: Self-pay | Admitting: Internal Medicine

## 2017-05-15 ENCOUNTER — Other Ambulatory Visit: Payer: Self-pay | Admitting: Internal Medicine

## 2017-05-18 ENCOUNTER — Other Ambulatory Visit: Payer: Self-pay | Admitting: Internal Medicine

## 2017-06-10 ENCOUNTER — Encounter: Payer: Self-pay | Admitting: Internal Medicine

## 2017-06-11 ENCOUNTER — Other Ambulatory Visit: Payer: Self-pay | Admitting: Internal Medicine

## 2017-06-11 DIAGNOSIS — K7581 Nonalcoholic steatohepatitis (NASH): Secondary | ICD-10-CM

## 2017-06-11 DIAGNOSIS — Z1239 Encounter for other screening for malignant neoplasm of breast: Secondary | ICD-10-CM

## 2017-06-11 NOTE — Progress Notes (Signed)
mamm

## 2017-07-05 ENCOUNTER — Other Ambulatory Visit: Payer: Self-pay | Admitting: Internal Medicine

## 2017-07-14 ENCOUNTER — Other Ambulatory Visit: Payer: Self-pay | Admitting: Internal Medicine

## 2017-07-14 DIAGNOSIS — E785 Hyperlipidemia, unspecified: Secondary | ICD-10-CM

## 2017-07-14 DIAGNOSIS — E1169 Type 2 diabetes mellitus with other specified complication: Secondary | ICD-10-CM

## 2017-07-14 NOTE — Telephone Encounter (Signed)
Ibuprofen refill denied bc she has not had labs since August and she is a diabetic with fatty liver on crestor. fasting labs ordered

## 2017-07-14 NOTE — Telephone Encounter (Signed)
Refilled: 05/17/2017 Last OV: 12/24/2016 Next OV: not scheduled Last CMP: 12/22/2016

## 2017-07-16 ENCOUNTER — Ambulatory Visit
Admission: RE | Admit: 2017-07-16 | Discharge: 2017-07-16 | Disposition: A | Discharge status: Home / Self Care | Payer: PPO | Source: Ambulatory Visit | Attending: Internal Medicine | Admitting: Internal Medicine

## 2017-07-16 DIAGNOSIS — Z1231 Encounter for screening mammogram for malignant neoplasm of breast: Secondary | ICD-10-CM | POA: Insufficient documentation

## 2017-07-16 DIAGNOSIS — Z1239 Encounter for other screening for malignant neoplasm of breast: Secondary | ICD-10-CM

## 2017-07-20 NOTE — Telephone Encounter (Signed)
Spoke with pt and was able to schedule her a fasting lab appt. Pt also stated that she does not need a refill on the ibuprofen right now due to having two bottles at home because she only takes it once a day.

## 2017-07-29 ENCOUNTER — Encounter: Payer: Self-pay | Admitting: Emergency Medicine

## 2017-07-29 ENCOUNTER — Emergency Department: Payer: PPO

## 2017-07-29 ENCOUNTER — Other Ambulatory Visit: Payer: PPO

## 2017-07-29 ENCOUNTER — Emergency Department
Admission: EM | Admit: 2017-07-29 | Discharge: 2017-07-29 | Disposition: A | Discharge status: Home / Self Care | Payer: PPO | Attending: Emergency Medicine | Admitting: Emergency Medicine

## 2017-07-29 ENCOUNTER — Other Ambulatory Visit: Payer: Self-pay

## 2017-07-29 DIAGNOSIS — W231XXA Caught, crushed, jammed, or pinched between stationary objects, initial encounter: Secondary | ICD-10-CM | POA: Insufficient documentation

## 2017-07-29 DIAGNOSIS — S9031XA Contusion of right foot, initial encounter: Secondary | ICD-10-CM

## 2017-07-29 DIAGNOSIS — S99921A Unspecified injury of right foot, initial encounter: Secondary | ICD-10-CM | POA: Diagnosis present

## 2017-07-29 DIAGNOSIS — I2510 Atherosclerotic heart disease of native coronary artery without angina pectoris: Secondary | ICD-10-CM | POA: Insufficient documentation

## 2017-07-29 DIAGNOSIS — Z7982 Long term (current) use of aspirin: Secondary | ICD-10-CM | POA: Insufficient documentation

## 2017-07-29 DIAGNOSIS — S92344A Nondisplaced fracture of fourth metatarsal bone, right foot, initial encounter for closed fracture: Secondary | ICD-10-CM | POA: Diagnosis not present

## 2017-07-29 DIAGNOSIS — I252 Old myocardial infarction: Secondary | ICD-10-CM | POA: Insufficient documentation

## 2017-07-29 DIAGNOSIS — S92334A Nondisplaced fracture of third metatarsal bone, right foot, initial encounter for closed fracture: Secondary | ICD-10-CM | POA: Diagnosis not present

## 2017-07-29 DIAGNOSIS — S92324A Nondisplaced fracture of second metatarsal bone, right foot, initial encounter for closed fracture: Secondary | ICD-10-CM

## 2017-07-29 DIAGNOSIS — S93304A Unspecified dislocation of right foot, initial encounter: Secondary | ICD-10-CM | POA: Diagnosis not present

## 2017-07-29 DIAGNOSIS — Y9389 Activity, other specified: Secondary | ICD-10-CM | POA: Diagnosis not present

## 2017-07-29 DIAGNOSIS — E119 Type 2 diabetes mellitus without complications: Secondary | ICD-10-CM | POA: Diagnosis not present

## 2017-07-29 DIAGNOSIS — Y999 Unspecified external cause status: Secondary | ICD-10-CM | POA: Insufficient documentation

## 2017-07-29 DIAGNOSIS — I10 Essential (primary) hypertension: Secondary | ICD-10-CM | POA: Insufficient documentation

## 2017-07-29 DIAGNOSIS — S93104A Unspecified dislocation of right toe(s), initial encounter: Secondary | ICD-10-CM | POA: Diagnosis not present

## 2017-07-29 DIAGNOSIS — Z7984 Long term (current) use of oral hypoglycemic drugs: Secondary | ICD-10-CM | POA: Diagnosis not present

## 2017-07-29 DIAGNOSIS — Z87891 Personal history of nicotine dependence: Secondary | ICD-10-CM | POA: Insufficient documentation

## 2017-07-29 DIAGNOSIS — Y92009 Unspecified place in unspecified non-institutional (private) residence as the place of occurrence of the external cause: Secondary | ICD-10-CM | POA: Insufficient documentation

## 2017-07-29 DIAGNOSIS — S93114A Dislocation of interphalangeal joint of right lesser toe(s), initial encounter: Secondary | ICD-10-CM | POA: Diagnosis not present

## 2017-07-29 MED ORDER — PENTAFLUOROPROP-TETRAFLUOROETH EX AERO
INHALATION_SPRAY | CUTANEOUS | Status: AC
  Administered 2017-07-29: 1 via TOPICAL
  Filled 2017-07-29: qty 30

## 2017-07-29 MED ORDER — OXYCODONE HCL 5 MG PO TABS: 5.0000 mg | ORAL_TABLET | Freq: Three times a day (TID) | ORAL | 0 refills | Status: AC | PRN

## 2017-07-29 MED ORDER — LIDOCAINE HCL (PF) 1 % IJ SOLN
5.0000 mL | Freq: Once | INTRAMUSCULAR | Status: AC
  Administered 2017-07-29: 5 mL
  Filled 2017-07-29: qty 5

## 2017-07-29 MED ORDER — PENTAFLUOROPROP-TETRAFLUOROETH EX AERO
1.0000 "application " | INHALATION_SPRAY | CUTANEOUS | Status: DC | PRN
  Administered 2017-07-29: 1 via TOPICAL

## 2017-07-29 NOTE — Discharge Instructions (Signed)
You have sustained fractures to the balls of the 2nd-4th toes. You also had a dislocation to the pinky toe. Take the pain medicine as needed. Rest with the foot elevated. Use the walker and do not bear weight through the toes. Close Dr. Cleda Mccreedy for recheck.

## 2017-07-29 NOTE — ED Notes (Signed)
See triage note  Presents with pain to right foot with some bruising after falling backwards no deformity noted

## 2017-07-29 NOTE — ED Triage Notes (Signed)
Got foot caught in chair yesterday and fell backward. bruising and swelling to right foot. Pt worked up until last year and reports is normally pretty tough but wanted to get checked out.

## 2017-07-29 NOTE — ED Provider Notes (Addendum)
[facility] Emergency Department Provider Note ____________________________________________  Time seen: 1020  I have reviewed the triage vital signs and the nursing notes.  HISTORY  Chief Complaint  Foot Injury  HPI Kimberly Huff is a 72 y.o. female resents to the ED accompanied by her sister and daughter, for evaluation of right foot pain.  Patient describes she was at home yesterday stand on a chair, when she apparently lost her footing.  She describes her foot getting caught in the chair somehow, causing her to fall.  She denies any head injury, loss of consciousness, or laceration.  Her primary complaint is continued pain, swelling, and bruising to the left foot distally.  She presents with pain with attempts to bear weight through the left foot.  No other injury at this time.  Past Medical History:  Diagnosis Date  . Allergy   . Atrophic kidney    left  . Blood transfusion   . Blood transfusion without reported diagnosis   . Cataract   . Clotting disorder (HCC)    blood clots in abdomen after surgery  . Coronary artery disease   . Coronary atherosclerosis   . Diabetes mellitus   . Diverticulosis 2005   Dr Jamal Collin  . External hemorrhoids without mention of complication 8768  . GERD (gastroesophageal reflux disease)   . H/O cardiac catheterization 2005  . Heart murmur 2012   found by Dr. Jamal Collin  . Hyperlipidemia   . Hypertension   . Hypopotassemia   . Kidney stone   . Myocardial infarction ([place]) 2000  . Neuromuscular disorder ([place])    "achy muscles"  . Obesity   . Osteoarthritis   . Recurrent UTI   . Spinal stenosis of cervical region   . Steatohepatitis 05/30/10   Brunt Stage 3, non alcoholic cirrhosis of the liver  . Type II or unspecified type diabetes mellitus without mention of complication, uncontrolled    Patient does takes Metformin and recommended to stop for 48 hours.  Marland Kitchen Ulcer   . Unspecified essential hypertension 2014     Patient Active Problem List   Diagnosis Date Noted  . Acute non-recurrent frontal sinusitis 02/15/2017  . Impacted cerumen of left ear 02/15/2017  . Urge incontinence of urine 02/25/2016  . Benign essential HTN 10/25/2015  . Atrophic kidney 10/25/2015  . History of myocardial infarction 10/25/2015  . Microalbuminuria due to type 2 diabetes mellitus ([place]) 06/29/2015  . Fracture of metatarsal of right foot, closed 06/27/2015  . Closed displaced fracture of proximal phalanx of lesser toe of right foot with malunion 06/27/2015  . Personal history of gastric ulcer 02/23/2013  . Nontraumatic shoulder pain, right 02/23/2013  . Vitamin D deficiency 02/23/2013  . History of right humeral fracture  02/16/2012  . Gouty arthritis 08/25/2011  . Type 2 diabetes mellitus, controlled, with renal complications ([place]) 11/57/2620  . Hemorrhoid 05/27/2011  . Obesity (BMI 30-39.9) 02/16/2011  . Dyslipidemia associated with type 2 diabetes mellitus ([place]) 02/16/2011  . Cataract extraction status 02/16/2011  . Nonfunctioning left kidney 02/16/2011  . NASH (nonalcoholic steatohepatitis) 02/16/2011    Past Surgical History:  Procedure Laterality Date  . ABDOMINAL HYSTERECTOMY  1982   complete  . BREAST CYST ASPIRATION Right 1990's   benign  . BREAST MASS EXCISION     benign  . CARDIAC CATHETERIZATION  2000   cardiac stent  . cardiac stents  2000  . CATARACT EXTRACTION     both eyes  . COLONOSCOPY  2004,  2015   Dr. Jamal Collin, Dr. Olevia Perches  . CYSTOSCOPY W/ RETROGRADES Left 11/12/2015   Procedure: CYSTOSCOPY WITH RETROGRADE PYELOGRAM;  Surgeon: Cleon Gustin, MD;  Location: [facility];  Service: Urology;  Laterality: Left;  . CYSTOSCOPY W/ URETERAL STENT PLACEMENT Left 11/12/2015   Procedure: CYSTOSCOPY WITH STENT REPLACEMENT;  Surgeon: Cleon Gustin, MD;  Location: [facility];  Service: Urology;  Laterality: Left;  . CYSTOSCOPY WITH URETEROSCOPY AND STENT PLACEMENT Left 10/15/2015   Procedure:  CYSTOSCOPY WITH URETEROSCOPY AND STENT PLACEMENT;  Surgeon: Cleon Gustin, MD;  Location: [facility];  Service: Urology;  Laterality: Left;  . EYE SURGERY Bilateral 2012   cataract  . FOOT SURGERY  2008  . PTCA  2000  . SALPINGOOPHORECTOMY    . TONSILLECTOMY    . UPPER GI ENDOSCOPY    . URETEROSCOPY WITH HOLMIUM LASER LITHOTRIPSY Left 11/12/2015   Procedure: URETEROSCOPY WITH HOLMIUM LASER LITHOTRIPSY;  Surgeon: Cleon Gustin, MD;  Location: [facility];  Service: Urology;  Laterality: Left;  . URETEROTOMY  1960/1969   x 2 during childhood  . VAGINAL HYSTERECTOMY     PT DENIES ABD HYSTERECTOMY    Prior to Admission medications   Medication Sig Start Date End Date Taking? Authorizing Provider  aspirin EC 325 MG tablet Take 325 mg by mouth daily.    [provider]  Cinnamon 500 MG capsule Take by mouth.    [provider]  furosemide (LASIX) 40 MG tablet TAKE ONE TABLET BY MOUTH EVERY DAY 05/07/17   [name], MD  IBU 800 MG tablet TAKE 1 TABLET BY MOUTH EVERY 8 HOURS AS NEEDED 05/17/17   [name], MD  losartan (COZAAR) 50 MG tablet TAKE ONE TABLET BY MOUTH EVERY DAY 05/07/17   [name], MD  metFORMIN (GLUCOPHAGE) 850 MG tablet TAKE ONE TABLET TWICE DAILY WITH A MEAL 05/19/17   [name], MD  omeprazole (PRILOSEC) 40 MG capsule TAKE 1 CAPSULE EVERY DAY 04/16/17   [name], MD  oxyCODONE (ROXICODONE) 5 MG immediate release tablet Take 1 tablet (5 mg total) by mouth every 8 (eight) hours as needed for up to 5 days. 07/29/17 08/03/17  Shoichi Mielke, Dannielle Karvonen, PA-C  potassium chloride (K-DUR) 10 MEQ tablet Take by mouth. 04/09/10   [provider]  potassium chloride (K-DUR) 10 MEQ tablet TAKE ONE TABLET EVERY DAY 07/05/17   [name], MD  rosuvastatin (CRESTOR) 10 MG tablet TAKE 1 TABLET BY MOUTH DAILY 07/05/17   [name], MD  traMADol (ULTRAM) 50 MG tablet Take 1 tablet (50 mg total) by mouth every 8 (eight) hours as needed.  11/19/16   [name], MD    Allergies Patient has no known allergies.  Family History  Problem Relation Age of Onset  . Lung cancer Father   . Heart disease Father   . Diverticulosis Mother   . Diabetes Mother   . Stroke Mother 34  . Autoimmune disease Daughter   . Thyroid disease Daughter   . Thyroid disease Sister   . Stomach cancer Maternal Aunt   . Breast cancer Maternal Aunt   . Colon cancer Neg Hx   . Esophageal cancer Neg Hx   . Rectal cancer Neg Hx     Social History Social History   Tobacco Use  . Smoking status: Former Smoker    Types: Cigarettes    Last attempt to quit: 09/15/1998    Years since quitting:  18.8  . Smokeless tobacco: Never Used  Substance Use Topics  . Alcohol use: Yes    Comment: very rare  . Drug use: No    Review of Systems  Constitutional: Negative for fever. Eyes: Negative for visual changes. ENT: Negative for sore throat. Cardiovascular: Negative for chest pain. Respiratory: Negative for shortness of breath. Gastrointestinal: Negative for abdominal pain, vomiting and diarrhea. Genitourinary: Negative for dysuria. Musculoskeletal: Negative for back pain.  Right foot pain as above. Skin: Negative for rash. Neurological: Negative for headaches, focal weakness or numbness. ____________________________________________  PHYSICAL EXAM:  VITAL SIGNS: ED Triage Vitals  Enc Vitals Group     BP 07/29/17 0942 (!) 157/74     Pulse Rate 07/29/17 0942 81     Resp 07/29/17 0942 17     Temp 07/29/17 0942 97.9 F (36.6 C)     Temp Source 07/29/17 0942 Oral     SpO2 07/29/17 0942 96 %     Weight 07/29/17 0936 200 lb (90.7 kg)     Height 07/29/17 0936 5' 3"  (1.6 m)     Head Circumference --      Peak Flow --      Pain Score 07/29/17 0936 5     Pain Loc --      Pain Edu? --      Excl. in [place]? --     Constitutional: Alert and oriented. Well appearing and in no distress. Head: Normocephalic and atraumatic. Eyes: Conjunctivae are  normal. PERRL. Normal extraocular movements Neck: Supple. No thyromegaly. Cardiovascular: Normal rate, regular rhythm. Normal distal pulses. Respiratory: Normal respiratory effort. No wheezes/rales/rhonchi. Musculoskeletal: Right foot with obvious dorsal soft tissue swelling and ecchymosis noted.  Patient is tender to palpation across the MTPs.  The plantar surface is already some early bruising noted to the fourth and fifth MTP heads.  Her deformity is noted to the fifth toe as well.  Nontender with normal range of motion in all extremities.  Neurologic:  Normal gait without ataxia. Normal speech and language. No gross focal neurologic deficits are appreciated. Skin:  Skin is warm, dry and intact. No rash noted. ____________________________________________   RADIOLOGY  Right Foot IMPRESSION: Fractures through the 2nd through 4th metatarsal heads.  Dislocation at the 5th MTP joint. ____________________________________________  PROCEDURES Post Op shoe Standard Walker  .Ortho Injury Treatment Date/Time: 07/29/2017 6:27 PM Performed by: Melvenia Needles, PA-C Authorized by: Melvenia Needles, PA-C   Consent:    Consent obtained:  Verbal   Consent given by:  Patient   Risks discussed:  Irreducible dislocationInjury location: toe Location details: right fifth toe Injury type: dislocation Pre-procedure neurovascular assessment: neurovascularly intact Anesthesia: digital block  Anesthesia: Local anesthesia used: yes Local Anesthetic: lidocaine 1% without epinephrine Anesthetic total: 3 mL  Patient sedated: NoManipulation performed: yes Reduction successful: yes Post-procedure neurovascular assessment: post-procedure neurovascularly intact Patient tolerance: Patient tolerated the procedure well with no immediate complications   ____________________________________________  INITIAL IMPRESSION / ASSESSMENT AND PLAN / ED COURSE  Patient with ED evaluation of  injury to the right foot.  She received initial fracture management of her second through fourth MTP fracture sutures, and reduction of her fifth toe.  Placed in a postop shoe and given a walker for nonweightbearing gait.  She will follow-up with podiatry for further fracture management.  A prescription for oxycodone IR is provided for pain relief.  I reviewed the patient's prescription history over the last 12 months in the multi-state controlled substances  database(s) that includes [place], [place], [place], [place], [place], [place], [place], [place], [place], [place], [place], [place], [place], and [place].  Results were notable for no recent narcotics.  ____________________________________________  FINAL CLINICAL IMPRESSION(S) / ED DIAGNOSES  Final diagnoses:  Closed nondisplaced fracture of second metatarsal bone of right foot, initial encounter  Dislocation of phalanx of right foot, initial encounter  Contusion of right foot, initial encounter  Closed nondisplaced fracture of third metatarsal bone of right foot, initial encounter  Closed nondisplaced fracture of fourth metatarsal bone of right foot, initial encounter      Melvenia Needles, PA-C 07/29/17 [address], Dannielle Karvonen, PA-C 07/29/17 1832    Melvenia Needles, PA-C 07/29/17 1832    Harvest Dark, MD 07/30/17 1859

## 2017-07-30 ENCOUNTER — Other Ambulatory Visit: Payer: Self-pay | Admitting: Internal Medicine

## 2017-08-03 DIAGNOSIS — S92344A Nondisplaced fracture of fourth metatarsal bone, right foot, initial encounter for closed fracture: Secondary | ICD-10-CM | POA: Diagnosis not present

## 2017-08-03 DIAGNOSIS — S92324A Nondisplaced fracture of second metatarsal bone, right foot, initial encounter for closed fracture: Secondary | ICD-10-CM | POA: Diagnosis not present

## 2017-08-03 DIAGNOSIS — S92334A Nondisplaced fracture of third metatarsal bone, right foot, initial encounter for closed fracture: Secondary | ICD-10-CM | POA: Diagnosis not present

## 2017-08-03 DIAGNOSIS — S93124A Dislocation of metatarsophalangeal joint of right lesser toe(s), initial encounter: Secondary | ICD-10-CM | POA: Diagnosis not present

## 2017-08-03 DIAGNOSIS — M79671 Pain in right foot: Secondary | ICD-10-CM | POA: Diagnosis not present

## 2017-08-04 ENCOUNTER — Ambulatory Visit: Payer: PPO | Admitting: Gastroenterology

## 2017-08-04 ENCOUNTER — Encounter: Payer: Self-pay | Admitting: Internal Medicine

## 2017-08-04 ENCOUNTER — Encounter: Payer: Self-pay | Admitting: Gastroenterology

## 2017-08-04 VITALS — BP 157/72 | HR 79 | Ht 63.5 in

## 2017-08-04 DIAGNOSIS — K760 Fatty (change of) liver, not elsewhere classified: Secondary | ICD-10-CM | POA: Diagnosis not present

## 2017-08-04 DIAGNOSIS — R748 Abnormal levels of other serum enzymes: Secondary | ICD-10-CM

## 2017-08-04 NOTE — Progress Notes (Signed)
Gastroenterology Consultation  Referring Provider:     [name], MD Primary Care Physician:  [name], MD Primary Gastroenterologist:  Dr. Allen Norris     Reason for Consultation:     Fatty liver        HPI:   Kimberly Huff is a 72 y.o. y/o female referred for consultation & management of fatty liver by Dr. Derrel Nip, Aris Everts, MD.  This patient comes in today after being followed at [facility] for fatty liver.  The patient has not had a follow-up or having an ultrasound in over a year.  The patient was tired of going back there and seeing a nurse practitioner all the time and not seeing her doctor.  She asked her primary care physician for a local physician and she was sent to me.  The patient states that she has been under a lot of stress with the death of her brother-in-law who she states was more like a brother to her.  She also reports that since then she has been eating a lot more and has not been able to lose weight.  The patient's liver enzymes have been elevated since 2014 with the enzymes peaking in 2015 at AST of 94 and ALT of 92.   the patient's last liver enzymes in August 2018 showed an AST of 72 and ALT of 80 with the prior being in December 2017 with a AST of 46 and ALT of 62.  The patient denies any alcohol abuse.  She also denies any abdominal pain nausea vomiting fevers chills black stools or bloody stools.  The patient did have a colonoscopy in [place] in 2015 with a adenomatous polyp found and it was recommended she have another colonoscopy in 5 years.  Past Medical History:  Diagnosis Date  . Allergy   . Atrophic kidney    left  . Blood transfusion   . Blood transfusion without reported diagnosis   . Cataract   . Clotting disorder (HCC)    blood clots in abdomen after surgery  . Coronary artery disease   . Coronary atherosclerosis   . Diabetes mellitus   . Diverticulosis 2005   Dr Jamal Collin  . External hemorrhoids without mention of complication  0076  . GERD (gastroesophageal reflux disease)   . H/O cardiac catheterization 2005  . Heart murmur 2012   found by Dr. Jamal Collin  . Hyperlipidemia   . Hypertension   . Hypopotassemia   . Kidney stone   . Myocardial infarction ([place]) 2000  . Neuromuscular disorder ([place])    "achy muscles"  . Obesity   . Osteoarthritis   . Recurrent UTI   . Spinal stenosis of cervical region   . Steatohepatitis 05/30/10   Brunt Stage 3, non alcoholic cirrhosis of the liver  . Type II or unspecified type diabetes mellitus without mention of complication, uncontrolled    Patient does takes Metformin and recommended to stop for 48 hours.  Marland Kitchen Ulcer   . Unspecified essential hypertension 2014    Past Surgical History:  Procedure Laterality Date  . ABDOMINAL HYSTERECTOMY  1982   complete  . BREAST CYST ASPIRATION Right 1990's   benign  . BREAST MASS EXCISION     benign  . CARDIAC CATHETERIZATION  2000   cardiac stent  . cardiac stents  2000  . CATARACT EXTRACTION     both eyes  . COLONOSCOPY  2004, 2015   Dr. Jamal Collin, Dr. Olevia Perches  . CYSTOSCOPY W/  RETROGRADES Left 11/12/2015   Procedure: CYSTOSCOPY WITH RETROGRADE PYELOGRAM;  Surgeon: Cleon Gustin, MD;  Location: [facility] ORS;  Service: Urology;  Laterality: Left;  . CYSTOSCOPY W/ URETERAL STENT PLACEMENT Left 11/12/2015   Procedure: CYSTOSCOPY WITH STENT REPLACEMENT;  Surgeon: Cleon Gustin, MD;  Location: [facility] OR[facility];  Service: Urology;  Laterality: Left;  . CYSTOSCOPY WITH URETEROSCOPY AND STENT PLACEMENT Left 10/15/2015   Procedure: CYSTOSCOPY WITH URETEROSCOPY AND STENT PLACEMENT;  Surgeon: Cleon Gustin, MD;  Location: [facility] ORS;  Service: Urology;  Laterality: Left;  . EYE SURGERY Bilateral 2012   cataract  . FOOT SURGERY  2008  . PTCA  2000  . SALPINGOOPHORECTOMY    . TONSILLECTOMY    . UPPER GI ENDOSCOPY    . URETEROSCOPY WITH HOLMIUM LASER LITHOTRIPSY Left 11/12/2015   Procedure: URETEROSCOPY WITH HOLMIUM LASER LITHOTRIPSY;  Surgeon:  Cleon Gustin, MD;  Location: [facility] OR[facility];  Service: Urology;  Laterality: Left;  . URETEROTOMY  1960/1969   x 2 during childhood  . VAGINAL HYSTERECTOMY     PT DENIES ABD HYSTERECTOMY    Prior to Admission medications   Medication Sig Start Date End Date Taking? Authorizing Provider  aspirin EC 325 MG tablet Take 325 mg by mouth daily.   Yes [provider]  Cinnamon 500 MG capsule Take by mouth.   Yes [provider]  IBU 800 MG tablet TAKE 1 TABLET BY MOUTH EVERY 8 HOURS AS NEEDED 05/17/17  Yes [name], MD  losartan (COZAAR) 50 MG tablet TAKE ONE TABLET EVERY DAY 07/30/17  Yes [name], MD  metFORMIN (GLUCOPHAGE) 850 MG tablet TAKE ONE TABLET TWICE DAILY WITH A MEAL 05/19/17  Yes [name], MD  omeprazole (PRILOSEC) 40 MG capsule TAKE 1 CAPSULE EVERY DAY 04/16/17  Yes [name], MD  potassium chloride (K-DUR) 10 MEQ tablet Take by mouth. 04/09/10  Yes [provider]  potassium chloride (K-DUR) 10 MEQ tablet TAKE ONE TABLET EVERY DAY 07/05/17  Yes [name], MD  rosuvastatin (CRESTOR) 10 MG tablet TAKE 1 TABLET BY MOUTH DAILY 07/05/17  Yes [name], MD  furosemide (LASIX) 40 MG tablet TAKE ONE TABLET EVERY DAY Patient not taking: Reported on 08/04/2017 07/30/17   [name], MD  traMADol (ULTRAM) 50 MG tablet Take 1 tablet (50 mg total) by mouth every 8 (eight) hours as needed. Patient not taking: Reported on 08/04/2017 11/19/16   [name], MD    Family History  Problem Relation Age of Onset  . Lung cancer Father   . Heart disease Father   . Diverticulosis Mother   . Diabetes Mother   . Stroke Mother 47  . Autoimmune disease Daughter   . Thyroid disease Daughter   . Thyroid disease Sister   . Stomach cancer Maternal Aunt   . Breast cancer Maternal Aunt   . Colon cancer Neg Hx   . Esophageal cancer Neg Hx   . Rectal cancer Neg Hx      Social History   Tobacco Use  . Smoking status: Former Smoker     Types: Cigarettes    Last attempt to quit: 09/15/1998    Years since quitting: 18.8  . Smokeless tobacco: Never Used  Substance Use Topics  . Alcohol use: Yes    Comment: very rare  . Drug use: No    Allergies as of 08/04/2017  . (No Known Allergies)    Review of Systems:  All systems reviewed and negative except where noted in HPI.   Physical Exam:  BP (!) 157/72   Pulse 79   Ht 5' 3.5" (1.613 m)   BMI 34.87 kg/m  No LMP recorded. Patient has had a hysterectomy. Psych:  Alert and cooperative. Normal mood and affect. General:   Alert,  Well-developed, well-nourished, pleasant and cooperative in NAD Head:  Normocephalic and atraumatic. Eyes:  Sclera clear, no icterus.   Conjunctiva pink. Ears:  Normal auditory acuity. Nose:  No deformity, discharge, or lesions. Mouth:  No deformity or lesions,oropharynx pink & moist. Neck:  Supple; no masses or thyromegaly. Lungs:  Respirations even and unlabored.  Clear throughout to auscultation.   No wheezes, crackles, or rhonchi. No acute distress. Heart:  Regular rate and rhythm; no murmurs, clicks, rubs, or gallops. Abdomen:  Normal bowel sounds.  No bruits.  Soft, non-tender and non-distended without masses, hepatosplenomegaly or hernias noted.  No guarding or rebound tenderness.  Negative Carnett sign.   Rectal:  Deferred.  Msk:  Symmetrical without gross deformities.  Good, equal movement & strength bilaterally. Pulses:  Normal pulses noted. Extremities:  No clubbing or edema.  No cyanosis. Neurologic:  Alert and oriented x3;  grossly normal neurologically. Skin:  Intact without significant lesions or rashes.  No jaundice. Lymph Nodes:  No significant cervical adenopathy. Psych:  Alert and cooperative. Normal mood and affect.  Imaging Studies: Dg Foot Complete Right  Result Date: 07/29/2017 CLINICAL DATA:  Right foot injury. Fall. Swelling across top of right foot. EXAM: RIGHT FOOT COMPLETE - 3+ VIEW COMPARISON:  06/27/2015  FINDINGS: Fractures through the 2nd through 4th metatarsal heads. No definite 5th metatarsal head fracture, but there appears to be dislocation at the 5th MTP joint with the proximal phalanx projecting dorsally relative to the metatarsal. Moderate hallux valgus deformity and osteopenia. Small plantar calcaneal spur. IMPRESSION: Fractures through the 2nd through 4th metatarsal heads. Dislocation at the 5th MTP joint. Electronically Signed   By: Rolm Baptise M.D.   On: 07/29/2017 10:01   Mm Screening Breast Tomo Bilateral  Result Date: 07/16/2017 CLINICAL DATA:  Screening. EXAM: DIGITAL SCREENING BILATERAL MAMMOGRAM WITH TOMO AND CAD COMPARISON:  Previous exam(s). ACR Breast Density Category c: The breast tissue is heterogeneously dense, which may obscure small masses. FINDINGS: There are no findings suspicious for malignancy. Images were processed with CAD. IMPRESSION: No mammographic evidence of malignancy. A result letter of this screening mammogram will be mailed directly to the patient. RECOMMENDATION: Screening mammogram in one year. (Code:SM-B-01Y) BI-RADS CATEGORY  1: Negative. Electronically Signed   By: Fidela Salisbury M.D.   On: 07/16/2017 16:42    Assessment and Plan:   KATTI PELLE is a 72 y.o. y/o female who comes in today with a history of fatty liver and she has been told in the past that she has cirrhosis.  The patient has continued abnormal liver enzymes.  The patient has been told to try and lose weight.  She has also been set up for a right upper quadrant ultrasound and a blood test for alpha-fetoprotein.  The patient has already been scheduled for a blood test for tomorrow by her primary care physician and we will add this to her blood draw for tomorrow.  The patient has been explained the plan and agrees with it.  Lucilla Lame, MD. Marval Regal   Note: This dictation was prepared with Dragon dictation along with smaller phrase technology. Any transcriptional errors that result from  this process are  unintentional.

## 2017-08-04 NOTE — Patient Instructions (Addendum)
You are scheduled for a RUQ abdominal US at [place], April 9th @9 :00am. Please arrive at the medical mall registration desk at 8:45am. You cannot eat or drink after midnight on Monday night.  If you need to reschedule this appointment for any reason, please contact central scheduling at [phone].

## 2017-08-05 ENCOUNTER — Other Ambulatory Visit (INDEPENDENT_AMBULATORY_CARE_PROVIDER_SITE_OTHER): Payer: PPO

## 2017-08-05 DIAGNOSIS — E1169 Type 2 diabetes mellitus with other specified complication: Secondary | ICD-10-CM

## 2017-08-05 DIAGNOSIS — R748 Abnormal levels of other serum enzymes: Secondary | ICD-10-CM | POA: Diagnosis not present

## 2017-08-05 DIAGNOSIS — E785 Hyperlipidemia, unspecified: Secondary | ICD-10-CM

## 2017-08-05 LAB — LIPID PANEL
Cholesterol: 84 mg/dL (ref 0–200)
HDL: 21.7 mg/dL — ABNORMAL LOW (ref >39.00)
LDL Cholesterol: 36 mg/dL (ref 0–99)
NonHDL: 62.7
Total CHOL/HDL Ratio: 4
Triglycerides: 136 mg/dL (ref 0.0–149.0)
VLDL: 27.2 mg/dL (ref 0.0–40.0)

## 2017-08-05 LAB — COMPREHENSIVE METABOLIC PANEL
ALT: 42 U/L — ABNORMAL HIGH (ref 0–35)
AST: 36 U/L (ref 0–37)
Albumin: 4.1 g/dL (ref 3.5–5.2)
Alkaline Phosphatase: 62 U/L (ref 39–117)
BUN: 16 mg/dL (ref 6–23)
CO2: 27 mEq/L (ref 19–32)
Calcium: 9.9 mg/dL (ref 8.4–10.5)
Chloride: 104 mEq/L (ref 96–112)
Creatinine, Ser: 0.67 mg/dL (ref 0.40–1.20)
GFR: 91.46 mL/min (ref >60.00)
Glucose, Bld: 169 mg/dL — ABNORMAL HIGH (ref 70–99)
Potassium: 5 mEq/L (ref 3.5–5.1)
Sodium: 140 mEq/L (ref 135–145)
Total Bilirubin: 0.8 mg/dL (ref 0.2–1.2)
Total Protein: 6.9 g/dL (ref 6.0–8.3)

## 2017-08-05 LAB — HEMOGLOBIN A1C: Hgb A1c MFr Bld: 7.1 % — ABNORMAL HIGH (ref 4.6–6.5)

## 2017-08-06 ENCOUNTER — Telehealth: Payer: Self-pay

## 2017-08-06 LAB — SPECIMEN STATUS REPORT

## 2017-08-06 LAB — AFP TUMOR MARKER: AFP, Serum, Tumor Marker: 8.2 ng/mL (ref 0.0–8.3)

## 2017-08-06 NOTE — Telephone Encounter (Signed)
Pt notified of lab result.

## 2017-08-06 NOTE — Telephone Encounter (Signed)
-----   Message from Lucilla Lame, MD sent at 08/06/2017  7:37 AM EDT ----- Let the patient know the blood tumor marker was negative.

## 2017-08-10 ENCOUNTER — Ambulatory Visit
Admission: RE | Admit: 2017-08-10 | Discharge: 2017-08-10 | Disposition: A | Discharge status: Home / Self Care | Payer: PPO | Source: Ambulatory Visit | Attending: Gastroenterology | Admitting: Gastroenterology

## 2017-08-10 DIAGNOSIS — K7460 Unspecified cirrhosis of liver: Secondary | ICD-10-CM | POA: Diagnosis not present

## 2017-08-10 DIAGNOSIS — R945 Abnormal results of liver function studies: Secondary | ICD-10-CM | POA: Diagnosis not present

## 2017-08-10 DIAGNOSIS — R748 Abnormal levels of other serum enzymes: Secondary | ICD-10-CM

## 2017-08-17 DIAGNOSIS — S92324D Nondisplaced fracture of second metatarsal bone, right foot, subsequent encounter for fracture with routine healing: Secondary | ICD-10-CM | POA: Diagnosis not present

## 2017-08-17 DIAGNOSIS — S93124A Dislocation of metatarsophalangeal joint of right lesser toe(s), initial encounter: Secondary | ICD-10-CM | POA: Diagnosis not present

## 2017-08-17 DIAGNOSIS — M79671 Pain in right foot: Secondary | ICD-10-CM | POA: Diagnosis not present

## 2017-08-18 ENCOUNTER — Telehealth: Payer: Self-pay

## 2017-08-18 NOTE — Telephone Encounter (Signed)
Pt notified of [place] results.

## 2017-08-18 NOTE — Telephone Encounter (Signed)
-----   Message from Lucilla Lame, MD sent at 08/11/2017  7:45 AM EDT ----- To the patient know that her ultrasound did not show any masses or worrisome features. It did show the known cirrhosis

## 2017-08-19 ENCOUNTER — Other Ambulatory Visit: Payer: Self-pay | Admitting: Internal Medicine

## 2017-08-19 NOTE — Telephone Encounter (Signed)
I will refill but even though she has normal renal function, remind her that she should not take this dose more than once daily due to her fatty liver   Lab Results  Component Value Date   CREATININE 0.67 08/05/2017

## 2017-08-19 NOTE — Telephone Encounter (Signed)
Refilled: 05/17/2017 Last OV: 12/24/2016 Next OV: not scheduled

## 2017-08-23 ENCOUNTER — Other Ambulatory Visit: Payer: Self-pay | Admitting: Internal Medicine

## 2017-08-23 DIAGNOSIS — I25119 Atherosclerotic heart disease of native coronary artery with unspecified angina pectoris: Secondary | ICD-10-CM | POA: Diagnosis not present

## 2017-08-23 DIAGNOSIS — E669 Obesity, unspecified: Secondary | ICD-10-CM | POA: Diagnosis not present

## 2017-08-23 DIAGNOSIS — E119 Type 2 diabetes mellitus without complications: Secondary | ICD-10-CM | POA: Diagnosis not present

## 2017-08-23 DIAGNOSIS — K219 Gastro-esophageal reflux disease without esophagitis: Secondary | ICD-10-CM | POA: Diagnosis not present

## 2017-08-23 DIAGNOSIS — R011 Cardiac murmur, unspecified: Secondary | ICD-10-CM | POA: Diagnosis not present

## 2017-08-23 DIAGNOSIS — E7849 Other hyperlipidemia: Secondary | ICD-10-CM | POA: Diagnosis not present

## 2017-08-23 DIAGNOSIS — N182 Chronic kidney disease, stage 2 (mild): Secondary | ICD-10-CM | POA: Diagnosis not present

## 2017-08-23 DIAGNOSIS — I10 Essential (primary) hypertension: Secondary | ICD-10-CM | POA: Diagnosis not present

## 2017-08-23 DIAGNOSIS — K7581 Nonalcoholic steatohepatitis (NASH): Secondary | ICD-10-CM | POA: Diagnosis not present

## 2017-08-26 DIAGNOSIS — R399 Unspecified symptoms and signs involving the genitourinary system: Secondary | ICD-10-CM | POA: Diagnosis not present

## 2017-08-26 DIAGNOSIS — N390 Urinary tract infection, site not specified: Secondary | ICD-10-CM | POA: Diagnosis not present

## 2017-08-31 ENCOUNTER — Other Ambulatory Visit: Payer: Self-pay | Admitting: Internal Medicine

## 2017-09-14 DIAGNOSIS — S92324D Nondisplaced fracture of second metatarsal bone, right foot, subsequent encounter for fracture with routine healing: Secondary | ICD-10-CM | POA: Diagnosis not present

## 2017-09-15 ENCOUNTER — Encounter: Payer: Self-pay | Admitting: Internal Medicine

## 2017-09-15 ENCOUNTER — Other Ambulatory Visit: Payer: Self-pay | Admitting: Internal Medicine

## 2017-09-15 MED ORDER — FLUCONAZOLE 150 MG PO TABS: 150.0000 mg | ORAL_TABLET | Freq: Every day | ORAL | 0 refills | Status: DC

## 2017-09-17 ENCOUNTER — Other Ambulatory Visit: Payer: Self-pay | Admitting: Internal Medicine

## 2017-09-24 ENCOUNTER — Ambulatory Visit (INDEPENDENT_AMBULATORY_CARE_PROVIDER_SITE_OTHER): Payer: PPO | Admitting: Internal Medicine

## 2017-09-24 ENCOUNTER — Encounter

## 2017-09-24 ENCOUNTER — Encounter: Payer: Self-pay | Admitting: Internal Medicine

## 2017-09-24 VITALS — BP 138/78 | HR 78 | Temp 98.2°F | Resp 16 | Ht 63.5 in | Wt 200.8 lb

## 2017-09-24 DIAGNOSIS — I10 Essential (primary) hypertension: Secondary | ICD-10-CM

## 2017-09-24 DIAGNOSIS — E1169 Type 2 diabetes mellitus with other specified complication: Secondary | ICD-10-CM

## 2017-09-24 DIAGNOSIS — E785 Hyperlipidemia, unspecified: Secondary | ICD-10-CM

## 2017-09-24 DIAGNOSIS — N182 Chronic kidney disease, stage 2 (mild): Secondary | ICD-10-CM

## 2017-09-24 DIAGNOSIS — N289 Disorder of kidney and ureter, unspecified: Secondary | ICD-10-CM

## 2017-09-24 DIAGNOSIS — E1122 Type 2 diabetes mellitus with diabetic chronic kidney disease: Secondary | ICD-10-CM

## 2017-09-24 DIAGNOSIS — E669 Obesity, unspecified: Secondary | ICD-10-CM | POA: Diagnosis not present

## 2017-09-24 NOTE — Progress Notes (Signed)
Subjective:  Patient ID: Kimberly Huff, female    DOB: [birth year]  Age: 72 y.o. MRN: [id number]  CC: The primary encounter diagnosis was Controlled type 2 diabetes mellitus with stage 2 chronic kidney disease, without long-term current use of insulin ([place]). Diagnoses of Nonfunctioning left kidney, Obesity (BMI 30-39.9), Dyslipidemia associated with type 2 diabetes mellitus ([place]), and Benign essential HTN were also pertinent to this visit.  HPI EDY MCBANE presents for 6 month follow up on diabetes.  Patient has no complaints today.  Patient is following a low glycemic index diet and taking all prescribed medications regularly without side effects.  Fasting sugars have been under less than 140 most of the time and post prandials have been under 160 except on rare occasions. Patient is exercising about 3 times per week and intentionally trying to lose weight .  Patient has had an eye exam in the last 12 months and checks feet regularly for signs of infection.  Patient does not walk barefoot outside,  And denies an numbness tingling or burning in feet. Patient is up to date on all recommended vaccinations  April labs reviewed   Fractured 3 toes on right in March after falling.  Saw podiatry .  Used a walker for 2 days.  Left lateral leg ligaments were also strained.  Hypertension  ;  Home readings rarely checked   Hda an episode of chills and rigors on April 24,  Fever to 100  Or 101, broke the next day,  Went to [facility] [place] [facility] .  treated for UTI   ith ceftriaxone Im INjection  And sent home with amoxicillin.  Ad diarrhea in spinte of probiotic    Called here first and was told she could not be seen   History of obstructing stone:  Does not want to see [facility] anymore;  wants to see Dr Doroteo Bradford if needed .  History of large obstructing stone on the left in 2017 which required intact retrieval via stenting of ureter . No lithotripsy .   Has occasional twinges of back pain      Lab Results  Component Value Date   HGBA1C 7.1 (H) 08/05/2017   Lab Results  Component Value Date   MICROALBUR 8.8 (H) 12/22/2016     Outpatient Medications Prior to Visit  Medication Sig Dispense Refill  . aspirin EC 325 MG tablet Take 325 mg by mouth daily.    . Cinnamon 500 MG capsule Take by mouth.    . furosemide (LASIX) 40 MG tablet TAKE 1 TABLET DAILY 30 tablet 5  . ibuprofen (IBU) 800 MG tablet Take 1 tablet (800 mg total) by mouth daily as needed. 90 tablet 1  . losartan (COZAAR) 50 MG tablet TAKE 1 TABLET DAILY 30 tablet 5  . metFORMIN (GLUCOPHAGE) 850 MG tablet TAKE 1 TABLET BY MOUTH DAILY WITH A MEAL 180 tablet 0  . omeprazole (PRILOSEC) 40 MG capsule TAKE 1 CAPSULE BY MOUTH EVERY DAY 30 capsule 4  . potassium chloride (K-DUR) 10 MEQ tablet TAKE ONE TABLET EVERY DAY 90 tablet 1  . rosuvastatin (CRESTOR) 10 MG tablet TAKE 1 TABLET BY MOUTH DAILY 90 tablet 1  . fluconazole (DIFLUCAN) 150 MG tablet Take 1 tablet (150 mg total) by mouth daily. (Patient not taking: Reported on 09/24/2017) 2 tablet 0  . potassium chloride (K-DUR) 10 MEQ tablet Take by mouth.    . traMADol (ULTRAM) 50 MG tablet Take 1 tablet (50 mg total) by  mouth every 8 (eight) hours as needed. (Patient not taking: Reported on 08/04/2017) 30 tablet 0   No facility-administered medications prior to visit.     Review of Systems;  Patient denies headache, fevers, malaise, unintentional weight loss, skin rash, eye pain, sinus congestion and sinus pain, sore throat, dysphagia,  hemoptysis , cough, dyspnea, wheezing, chest pain, palpitations, orthopnea, edema, abdominal pain, nausea, melena, diarrhea, constipation, flank pain, dysuria, hematuria, urinary  Frequency, nocturia, numbness, tingling, seizures,  Focal weakness, Loss of consciousness,  Tremor, insomnia, depression, anxiety, and suicidal ideation.      Objective:  BP 138/78 (BP Location: Left Arm, Patient Position: Sitting, Cuff Size: Large)   Pulse  78   Temp 98.2 F (36.8 C) (Oral)   Resp 16   Ht 5' 3.5" (1.613 m)   Wt 200 lb 12.8 oz (91.1 kg)   SpO2 96%   BMI 35.01 kg/m   BP Readings from Last 3 Encounters:  09/24/17 138/78  08/04/17 (!) 157/72  07/29/17 (!) 157/74    Wt Readings from Last 3 Encounters:  09/24/17 200 lb 12.8 oz (91.1 kg)  07/29/17 200 lb (90.7 kg)  12/24/16 202 lb 3.2 oz (91.7 kg)    General appearance: alert, cooperative and appears stated age Ears: normal TM's and external ear canals both ears Throat: lips, mucosa, and tongue normal; teeth and gums normal Neck: no adenopathy, no carotid bruit, supple, symmetrical, trachea midline and thyroid not enlarged, symmetric, no tenderness/mass/nodules Back: symmetric, no curvature. ROM normal. No CVA tenderness. Lungs: clear to auscultation bilaterally Heart: regular rate and rhythm, S1, S2 normal, no murmur, click, rub or gallop Abdomen: soft, non-tender; bowel sounds normal; no masses,  no organomegaly Pulses: 2+ and symmetric Skin: Skin color, texture, turgor normal. No rashes or lesions Lymph nodes: Cervical, supraclavicular, and axillary nodes normal.  Lab Results  Component Value Date   HGBA1C 7.1 (H) 08/05/2017   HGBA1C 6.9 (H) 12/22/2016   HGBA1C 6.3 04/20/2016    Lab Results  Component Value Date   CREATININE 0.67 08/05/2017   CREATININE 0.74 12/22/2016   CREATININE 0.68 04/20/2016    Lab Results  Component Value Date   WBC 7.0 04/20/2016   HGB 14.3 04/20/2016   HCT 42.6 04/20/2016   PLT 168.0 04/20/2016   GLUCOSE 169 (H) 08/05/2017   CHOL 84 08/05/2017   TRIG 136.0 08/05/2017   HDL 21.70 (L) 08/05/2017   LDLDIRECT 82.0 12/22/2016   LDLCALC 36 08/05/2017   ALT 42 (H) 08/05/2017   AST 36 08/05/2017   NA 140 08/05/2017   K 5.0 08/05/2017   CL 104 08/05/2017   CREATININE 0.67 08/05/2017   BUN 16 08/05/2017   CO2 27 08/05/2017   TSH 0.76 09/10/2014   INR 1.0 09/10/2014   HGBA1C 7.1 (H) 08/05/2017   MICROALBUR 8.8 (H)  12/22/2016    US Abdomen Limited Ruq  Result Date: 08/10/2017 CLINICAL DATA:  Elevated liver enzymes; history of [place] and cirrhosis. EXAM: ULTRASOUND ABDOMEN LIMITED RIGHT UPPER QUADRANT COMPARISON:  Abdominal ultrasound of March 11, 2016 FINDINGS: Gallbladder: No gallstones or wall thickening visualized. No sonographic Murphy sign noted by sonographer. Common bile duct: Diameter: 7 mm.  No intraluminal stones or sludge are observed. Liver: No focal lesion identified. Within normal limits in parenchymal echogenicity. Portal vein is patent on color Doppler imaging with normal direction of blood flow towards the liver. IMPRESSION: Cirrhotic changes within the liver. No suspicious masses. Poor portal venous flow direction remains normal. Top-normal common bile duct  diameter.  Normal appearing gallbladder. Electronically Signed   By: David  Martinique M.D.   On: 08/10/2017 09:36    Assessment & Plan:   Problem List Items Addressed This Visit    Nonfunctioning left kidney (Chronic)    She has No overt urinary symptoms but  had an episode of chills and rigors in April and was denied an appointment here,  However s patient report she was treated empirically at [facility] clinic with ceftriaxone       Type 2 diabetes mellitus, controlled, with renal complications ([place]) - Primary   Relevant Orders   Hemoglobin A1c   Microalbumin / creatinine urine ratio   Comprehensive metabolic panel   Lipid panel   Obesity (BMI 30-39.9)    I have addressed  BMI and recommended wt loss of 10% of body weight over the next 6 months using a low glycemic index diet and regular exercise a minimum of 5 days per week.        Dyslipidemia associated with type 2 diabetes mellitus (HCC)    LDL and triglycerides are elevated on  simvastatin. She has no side effects and liver enzymes are elevated due to fatty liver,  But stable. Changing medication to generic Crestor   Lab Results  Component Value Date   CHOL 84 08/05/2017    HDL 21.70 (L) 08/05/2017   LDLCALC 36 08/05/2017   LDLDIRECT 82.0 12/22/2016   TRIG 136.0 08/05/2017   CHOLHDL 4 08/05/2017   Lab Results  Component Value Date   ALT 42 (H) 08/05/2017   AST 36 08/05/2017   ALKPHOS 62 08/05/2017   BILITOT 0.8 08/05/2017           Benign essential HTN    Well controlled on current regimen. Renal function stable, no changes today.  Lab Results  Component Value Date   CREATININE 0.67 08/05/2017   Lab Results  Component Value Date   NA 140 08/05/2017   K 5.0 08/05/2017   CL 104 08/05/2017   CO2 27 08/05/2017            I have discontinued [name] "[name]"'s traMADol and fluconazole. I am also having her maintain her aspirin EC, Cinnamon, potassium chloride, rosuvastatin, ibuprofen, metFORMIN, furosemide, losartan, and omeprazole.  No orders of the defined types were placed in this encounter.   Medications Discontinued During This Encounter  Medication Reason  . fluconazole (DIFLUCAN) 150 MG tablet   . potassium chloride (K-DUR) 10 MEQ tablet Duplicate  . traMADol (ULTRAM) 50 MG tablet Patient has not taken in last 30 days    Follow-up: Return in about 6 months (around 03/27/2018) for fasting labs in August , follow up diabetes.   [name], MD

## 2017-09-24 NOTE — Patient Instructions (Addendum)
  The new goals for optimal blood pressure management are 130/80 or less . Please check your blood pressure a few times at home and send me the readings so I can determine if you need a change or adjustment  in medication   Return for fasting labs I 3 months,  See me in 6 months  Dr Alyson Ingles referral whenever you feel you need it.

## 2017-09-26 NOTE — Assessment & Plan Note (Signed)
She has No overt urinary symptoms but  had an episode of chills and rigors in April and was denied an appointment here,  However s patient report she was treated empirically at [facility] clinic with ceftriaxone

## 2017-09-26 NOTE — Assessment & Plan Note (Signed)
I have addressed  BMI and recommended wt loss of 10% of body weight over the next 6 months using a low glycemic index diet and regular exercise a minimum of 5 days per week.   

## 2017-09-26 NOTE — Assessment & Plan Note (Signed)
Well controlled on current regimen. Renal function stable, no changes today.  Lab Results  Component Value Date   CREATININE 0.67 08/05/2017   Lab Results  Component Value Date   NA 140 08/05/2017   K 5.0 08/05/2017   CL 104 08/05/2017   CO2 27 08/05/2017

## 2017-09-26 NOTE — Assessment & Plan Note (Signed)
LDL and triglycerides are elevated on  simvastatin. She has no side effects and liver enzymes are elevated due to fatty liver,  But stable. Changing medication to generic Crestor   Lab Results  Component Value Date   CHOL 84 08/05/2017   HDL 21.70 (L) 08/05/2017   LDLCALC 36 08/05/2017   LDLDIRECT 82.0 12/22/2016   TRIG 136.0 08/05/2017   CHOLHDL 4 08/05/2017   Lab Results  Component Value Date   ALT 42 (H) 08/05/2017   AST 36 08/05/2017   ALKPHOS 62 08/05/2017   BILITOT 0.8 08/05/2017

## 2017-10-05 ENCOUNTER — Encounter: Payer: Self-pay | Admitting: Internal Medicine

## 2017-10-05 MED ORDER — METFORMIN HCL 850 MG PO TABS
850.0000 mg | ORAL_TABLET | Freq: Two times a day (BID) | ORAL | 3 refills | Status: DC
Start: 2017-10-05 — End: 2018-10-04

## 2017-10-11 ENCOUNTER — Other Ambulatory Visit: Payer: Self-pay

## 2017-10-11 DIAGNOSIS — Z780 Asymptomatic menopausal state: Secondary | ICD-10-CM

## 2017-10-27 DIAGNOSIS — H35372 Puckering of macula, left eye: Secondary | ICD-10-CM | POA: Diagnosis not present

## 2017-10-27 DIAGNOSIS — Z9842 Cataract extraction status, left eye: Secondary | ICD-10-CM | POA: Diagnosis not present

## 2017-10-27 DIAGNOSIS — H40023 Open angle with borderline findings, high risk, bilateral: Secondary | ICD-10-CM | POA: Diagnosis not present

## 2017-10-27 DIAGNOSIS — H52211 Irregular astigmatism, right eye: Secondary | ICD-10-CM | POA: Diagnosis not present

## 2017-10-27 DIAGNOSIS — Z9841 Cataract extraction status, right eye: Secondary | ICD-10-CM | POA: Diagnosis not present

## 2017-10-27 DIAGNOSIS — E119 Type 2 diabetes mellitus without complications: Secondary | ICD-10-CM | POA: Diagnosis not present

## 2017-11-10 ENCOUNTER — Ambulatory Visit
Admission: RE | Admit: 2017-11-10 | Discharge: 2017-11-10 | Disposition: A | Discharge status: Home / Self Care | Payer: PPO | Source: Ambulatory Visit | Attending: Internal Medicine | Admitting: Internal Medicine

## 2017-11-10 DIAGNOSIS — M810 Age-related osteoporosis without current pathological fracture: Secondary | ICD-10-CM | POA: Diagnosis not present

## 2017-11-10 DIAGNOSIS — Z780 Asymptomatic menopausal state: Secondary | ICD-10-CM | POA: Diagnosis not present

## 2017-11-10 DIAGNOSIS — M85832 Other specified disorders of bone density and structure, left forearm: Secondary | ICD-10-CM | POA: Diagnosis not present

## 2017-11-12 ENCOUNTER — Encounter: Payer: Self-pay | Admitting: Internal Medicine

## 2017-11-12 DIAGNOSIS — M810 Age-related osteoporosis without current pathological fracture: Secondary | ICD-10-CM | POA: Insufficient documentation

## 2017-11-20 ENCOUNTER — Other Ambulatory Visit: Payer: Self-pay | Admitting: Internal Medicine

## 2017-12-07 ENCOUNTER — Other Ambulatory Visit: Payer: Self-pay | Admitting: Internal Medicine

## 2017-12-08 NOTE — Telephone Encounter (Signed)
Refilled: 08/19/2017 Last OV: 02/15/2017 Next OV: not scheduled Last CMP: 08/05/2017  abnormal

## 2017-12-09 ENCOUNTER — Other Ambulatory Visit: Payer: PPO

## 2018-02-03 ENCOUNTER — Ambulatory Visit (INDEPENDENT_AMBULATORY_CARE_PROVIDER_SITE_OTHER): Payer: PPO | Admitting: Family Medicine

## 2018-02-03 ENCOUNTER — Encounter: Payer: Self-pay | Admitting: Family Medicine

## 2018-02-03 VITALS — BP 142/98 | HR 101 | Temp 99.7°F | Ht 63.5 in | Wt 199.4 lb

## 2018-02-03 DIAGNOSIS — N3000 Acute cystitis without hematuria: Secondary | ICD-10-CM

## 2018-02-03 DIAGNOSIS — N289 Disorder of kidney and ureter, unspecified: Secondary | ICD-10-CM

## 2018-02-03 DIAGNOSIS — R300 Dysuria: Secondary | ICD-10-CM

## 2018-02-03 LAB — POCT URINALYSIS DIPSTICK
Bilirubin, UA: NEGATIVE
Blood, UA: 10
Glucose, UA: NEGATIVE
Ketones, UA: NEGATIVE
Nitrite, UA: POSITIVE
Protein, UA: POSITIVE — AB
Spec Grav, UA: 1.02 (ref 1.010–1.025)
Urobilinogen, UA: 2 E.U./dL — AB
pH, UA: 6 (ref 5.0–8.0)

## 2018-02-03 MED ORDER — SULFAMETHOXAZOLE-TRIMETHOPRIM 400-80 MG PO TABS: 1.0000 | ORAL_TABLET | Freq: Two times a day (BID) | ORAL | 0 refills | Status: AC

## 2018-02-03 MED ORDER — CEFTRIAXONE SODIUM 1 G IJ SOLR
1.0000 g | Freq: Once | INTRAMUSCULAR | Status: AC
Start: 2018-02-03 — End: 2018-02-03
  Administered 2018-02-03: 1 g via INTRAMUSCULAR

## 2018-02-03 NOTE — Progress Notes (Signed)
Subjective:    Patient ID: Kimberly Huff, female    DOB: January 24, 1946, 72 y.o.   MRN: [id number]  HPI   Presents to clinic c/o possible urinary infection due to low back pain, increased urinary urgency for a few weeks, getting worse. Patient has hx of severe UTIs and only has one functioning kidney.  She has felt a little feverish at home, took 2 aspirin this AM. Has been in trying to increase her fluids.   CMP Latest Ref Rng & Units 08/05/2017 12/22/2016 04/20/2016  Glucose 70 - 99 mg/dL 169(H) 174(H) 136(H)  BUN 6 - 23 mg/dL 16 20 23   Creatinine 0.40 - 1.20 mg/dL 0.67 0.74 0.68  Sodium 135 - 145 mEq/L 140 138 141  Potassium 3.5 - 5.1 mEq/L 5.0 4.8 4.9  Chloride 96 - 112 mEq/L 104 101 103  CO2 19 - 32 mEq/L 27 31 30   Calcium 8.4 - 10.5 mg/dL 9.9 10.0 9.8  Total Protein 6.0 - 8.3 g/dL 6.9 7.3 6.8  Total Bilirubin 0.2 - 1.2 mg/dL 0.8 0.8 0.5  Alkaline Phos 39 - 117 U/L 62 67 77  AST 0 - 37 U/L 36 73(H) 46(H)  ALT 0 - 35 U/L 42(H) 80(H) 62(H)   08/05/17 GFR >60.00 mL/min 91.46     Patient Active Problem List   Diagnosis Date Noted  . Osteoporosis 11/12/2017  . Acute non-recurrent frontal sinusitis 02/15/2017  . Impacted cerumen of left ear 02/15/2017  . Urge incontinence of urine 02/25/2016  . Benign essential HTN 10/25/2015  . Atrophic kidney 10/25/2015  . History of myocardial infarction 10/25/2015  . Microalbuminuria due to type 2 diabetes mellitus ([place]) 06/29/2015  . Fracture of metatarsal of right foot, closed 06/27/2015  . Closed displaced fracture of proximal phalanx of lesser toe of right foot with malunion 06/27/2015  . Personal history of gastric ulcer 02/23/2013  . Nontraumatic shoulder pain, right 02/23/2013  . Vitamin D deficiency 02/23/2013  . History of right humeral fracture  02/16/2012  . Gouty arthritis 08/25/2011  . Type 2 diabetes mellitus, controlled, with renal complications ([place]) 33/43/5686  . Hemorrhoid 05/27/2011  . Obesity (BMI 30-39.9) 02/16/2011    . Dyslipidemia associated with type 2 diabetes mellitus ([place]) 02/16/2011  . Cataract extraction status 02/16/2011  . Nonfunctioning left kidney 02/16/2011  . NASH (nonalcoholic steatohepatitis) 02/16/2011   Social History   Tobacco Use  . Smoking status: Former Smoker    Types: Cigarettes    Last attempt to quit: 09/15/1998    Years since quitting: 19.4  . Smokeless tobacco: Never Used  Substance Use Topics  . Alcohol use: Yes    Comment: very rare   Review of Systems  Constitutional: Negative for chills, fatigue and fever.  HENT: Negative for congestion, ear pain, sinus pain and sore throat.   Eyes: Negative.   Respiratory: Negative for cough, shortness of breath and wheezing.   Cardiovascular: Negative for chest pain, palpitations and leg swelling.  Gastrointestinal: Negative for abdominal pain, diarrhea, nausea and vomiting.  Genitourinary: +dysuria,  and urgency.  Musculoskeletal: +low back pain Skin: Negative for color change, pallor and rash.  Neurological: Negative for syncope, light-headedness and headaches.  Psychiatric/Behavioral: The patient is not nervous/anxious.    Objective:   Physical Exam  Constitutional: She is oriented to person, place, and time. She appears well-nourished. No distress.  HENT:  Head: Normocephalic and atraumatic.  Eyes: No scleral icterus.  Neck: Neck supple. No tracheal deviation present.  Cardiovascular: Regular rhythm and  normal heart sounds.  Pulmonary/Chest: Effort normal and breath sounds normal. No respiratory distress.  Abdominal: Soft. Bowel sounds are normal.  Mild suprapubic tenderness and mild left flank pain  Neurological: She is alert and oriented to person, place, and time.  Skin: Skin is warm and dry. No rash noted. She is not diaphoretic. No pallor.  Psychiatric: She has a normal mood and affect. Her behavior is normal.  Nursing note and vitals reviewed.     Vitals:   02/03/18 1623  BP: (!) 142/98  Pulse: (!) 101   Temp: 99.7 F (37.6 C)  SpO2: 93%    Assessment & Plan:    UTI/Dysura -- UA has +leuks and +nitrites so she does have UTI. She will get 1 gram IM rocephin x1 in clinic and will take Bactrim 400-80 mg BID for 5 days. We will get CMP to monitor kidney functions today. Most recent kidney functions from April 2019 were very good.  Administrations This Visit    cefTRIAXone (ROCEPHIN) injection 1 g    Admin Date 02/03/2018 Action Given Dose 1 g Route Intramuscular Administered By Neta Ehlers, RMA         Keep regularly scheduled follow up as planned, return to clinic sooner if issues arise.

## 2018-02-03 NOTE — Patient Instructions (Signed)

## 2018-02-04 LAB — COMPREHENSIVE METABOLIC PANEL
ALT: 42 U/L — ABNORMAL HIGH (ref 0–35)
AST: 26 U/L (ref 0–37)
Albumin: 4.6 g/dL (ref 3.5–5.2)
Alkaline Phosphatase: 72 U/L (ref 39–117)
BUN: 16 mg/dL (ref 6–23)
CO2: 29 mEq/L (ref 19–32)
Calcium: 10.4 mg/dL (ref 8.4–10.5)
Chloride: 98 mEq/L (ref 96–112)
Creatinine, Ser: 0.83 mg/dL (ref 0.40–1.20)
GFR: 71.34 mL/min (ref >60.00)
Glucose, Bld: 151 mg/dL — ABNORMAL HIGH (ref 70–99)
Potassium: 4.6 mEq/L (ref 3.5–5.1)
Sodium: 136 mEq/L (ref 135–145)
Total Bilirubin: 0.9 mg/dL (ref 0.2–1.2)
Total Protein: 7.7 g/dL (ref 6.0–8.3)

## 2018-02-04 LAB — URINE CULTURE
MICRO NUMBER:: 91189934
SPECIMEN QUALITY:: ADEQUATE

## 2018-02-09 ENCOUNTER — Telehealth: Payer: Self-pay | Admitting: Family

## 2018-02-09 NOTE — Telephone Encounter (Signed)
Did you see note to call pt?  Ensure she has seen result note and symptoms resolved

## 2018-02-09 NOTE — Telephone Encounter (Signed)
Spoke with patient she did get message in regards to urine culture being negative. She has completed antibiotic . She went ahead and completed antibiotic.

## 2018-02-10 ENCOUNTER — Other Ambulatory Visit (INDEPENDENT_AMBULATORY_CARE_PROVIDER_SITE_OTHER): Payer: PPO

## 2018-02-10 DIAGNOSIS — E1122 Type 2 diabetes mellitus with diabetic chronic kidney disease: Secondary | ICD-10-CM

## 2018-02-10 DIAGNOSIS — N182 Chronic kidney disease, stage 2 (mild): Secondary | ICD-10-CM | POA: Diagnosis not present

## 2018-02-10 LAB — COMPREHENSIVE METABOLIC PANEL
ALT: 58 U/L — ABNORMAL HIGH (ref 0–35)
AST: 45 U/L — ABNORMAL HIGH (ref 0–37)
Albumin: 4.1 g/dL (ref 3.5–5.2)
Alkaline Phosphatase: 63 U/L (ref 39–117)
BUN: 22 mg/dL (ref 6–23)
CO2: 27 mEq/L (ref 19–32)
Calcium: 9.6 mg/dL (ref 8.4–10.5)
Chloride: 104 mEq/L (ref 96–112)
Creatinine, Ser: 0.74 mg/dL (ref 0.40–1.20)
GFR: 81.44 mL/min (ref >60.00)
Glucose, Bld: 194 mg/dL — ABNORMAL HIGH (ref 70–99)
Potassium: 4.9 mEq/L (ref 3.5–5.1)
Sodium: 138 mEq/L (ref 135–145)
Total Bilirubin: 0.4 mg/dL (ref 0.2–1.2)
Total Protein: 6.8 g/dL (ref 6.0–8.3)

## 2018-02-10 LAB — MICROALBUMIN / CREATININE URINE RATIO
Creatinine,U: 81.1 mg/dL
Microalb Creat Ratio: 3.9 mg/g (ref 0.0–30.0)
Microalb, Ur: 3.2 mg/dL — ABNORMAL HIGH (ref 0.0–1.9)

## 2018-02-10 LAB — LIPID PANEL
Cholesterol: 95 mg/dL (ref 0–200)
HDL: 18.5 mg/dL — ABNORMAL LOW (ref >39.00)
LDL Cholesterol: 37 mg/dL (ref 0–99)
NonHDL: 76.06
Total CHOL/HDL Ratio: 5
Triglycerides: 197 mg/dL — ABNORMAL HIGH (ref 0.0–149.0)
VLDL: 39.4 mg/dL (ref 0.0–40.0)

## 2018-02-10 LAB — HEMOGLOBIN A1C: Hgb A1c MFr Bld: 7.8 % — ABNORMAL HIGH (ref 4.6–6.5)

## 2018-02-13 ENCOUNTER — Telehealth: Payer: Self-pay | Admitting: Internal Medicine

## 2018-02-21 ENCOUNTER — Other Ambulatory Visit: Payer: Self-pay | Admitting: Internal Medicine

## 2018-02-21 MED ORDER — GLUCOSE BLOOD VI STRP: ORAL_STRIP | 12 refills | Status: DC

## 2018-02-21 NOTE — Telephone Encounter (Signed)
Copied from [place] [id number]. Topic: Quick Communication - Rx Refill/Question >> Feb 21, 2018  2:08 PM Selinda Flavin B, [place] wrote: Medication: One touch ultra blood glucose testing strips  Has the patient contacted their pharmacy? Yes.   (Agent: If no, request that the patient contact the pharmacy for the refill.) (Agent: If yes, when and what did the pharmacy advise?)  Preferred Pharmacy (with phone number or street name): [place], [place] - [place]: Please be advised that RX refills may take up to 3 business days. We ask that you follow-up with your pharmacy.

## 2018-02-21 NOTE — Telephone Encounter (Signed)
Test strips have been ordered.

## 2018-02-21 NOTE — Telephone Encounter (Signed)
Patient requesting a prescription for one touch ultra blood glucose testing strips. There is no order for these but is noted in a telephone note 02/14/18 Dr. Derrel Nip wants her to start checking her blood sugar once daily.  [facility], [place]. Routing to PCP for follow up.

## 2018-03-08 ENCOUNTER — Other Ambulatory Visit: Payer: Self-pay | Admitting: Internal Medicine

## 2018-03-14 ENCOUNTER — Encounter: Payer: Self-pay | Admitting: Internal Medicine

## 2018-03-14 ENCOUNTER — Encounter

## 2018-03-14 ENCOUNTER — Ambulatory Visit (INDEPENDENT_AMBULATORY_CARE_PROVIDER_SITE_OTHER): Payer: PPO | Admitting: Internal Medicine

## 2018-03-14 VITALS — BP 138/64 | HR 76 | Temp 98.5°F | Resp 15 | Ht 63.5 in | Wt 195.6 lb

## 2018-03-14 DIAGNOSIS — M810 Age-related osteoporosis without current pathological fracture: Secondary | ICD-10-CM | POA: Diagnosis not present

## 2018-03-14 DIAGNOSIS — H6122 Impacted cerumen, left ear: Secondary | ICD-10-CM | POA: Diagnosis not present

## 2018-03-14 DIAGNOSIS — J0110 Acute frontal sinusitis, unspecified: Secondary | ICD-10-CM

## 2018-03-14 DIAGNOSIS — E1129 Type 2 diabetes mellitus with other diabetic kidney complication: Secondary | ICD-10-CM | POA: Diagnosis not present

## 2018-03-14 DIAGNOSIS — N289 Disorder of kidney and ureter, unspecified: Secondary | ICD-10-CM

## 2018-03-14 DIAGNOSIS — R809 Proteinuria, unspecified: Secondary | ICD-10-CM | POA: Diagnosis not present

## 2018-03-14 DIAGNOSIS — I10 Essential (primary) hypertension: Secondary | ICD-10-CM | POA: Diagnosis not present

## 2018-03-14 DIAGNOSIS — K7581 Nonalcoholic steatohepatitis (NASH): Secondary | ICD-10-CM

## 2018-03-14 DIAGNOSIS — E1122 Type 2 diabetes mellitus with diabetic chronic kidney disease: Secondary | ICD-10-CM

## 2018-03-14 DIAGNOSIS — E785 Hyperlipidemia, unspecified: Secondary | ICD-10-CM

## 2018-03-14 DIAGNOSIS — K760 Fatty (change of) liver, not elsewhere classified: Secondary | ICD-10-CM | POA: Diagnosis not present

## 2018-03-14 DIAGNOSIS — N181 Chronic kidney disease, stage 1: Secondary | ICD-10-CM | POA: Diagnosis not present

## 2018-03-14 DIAGNOSIS — E1169 Type 2 diabetes mellitus with other specified complication: Secondary | ICD-10-CM

## 2018-03-14 MED ORDER — AMOXICILLIN-POT CLAVULANATE 875-125 MG PO TABS: 1.0000 | ORAL_TABLET | Freq: Two times a day (BID) | ORAL | 0 refills | Status: DC

## 2018-03-14 MED ORDER — DENOSUMAB 60 MG/ML ~~LOC~~ SOSY: 60.0000 mg | PREFILLED_SYRINGE | Freq: Once | SUBCUTANEOUS | 0 refills | Status: AC

## 2018-03-14 MED ORDER — GLIPIZIDE 5 MG PO TABS: 2.5000 mg | ORAL_TABLET | Freq: Every day | ORAL | 3 refills | Status: DC

## 2018-03-14 NOTE — Progress Notes (Signed)
Subjective:  Patient ID: Kimberly Huff, female    DOB: 1945/10/03  Age: 72 y.o. MRN: [id number]  CC: The primary encounter diagnosis was Age-related osteoporosis without current pathological fracture. Diagnoses of Fatty liver, Hearing loss of left ear due to cerumen impaction, Benign essential HTN, Acute non-recurrent frontal sinusitis, Controlled type 2 diabetes mellitus with stage 1 chronic kidney disease, without long-term current use of insulin ([place]), Microalbuminuria due to type 2 diabetes mellitus ([place]), Nonfunctioning left kidney, Dyslipidemia associated with type 2 diabetes mellitus ([place]), and NASH (nonalcoholic steatohepatitis) were also pertinent to this visit.  HPI Kimberly Huff presents for 6 month follow up on diabetes and other issues.    Patient is following a low glycemic index diet and taking all prescribed medications regularly without side effects.  Fasting sugars have as high as 200  On occasion but have improved to 150 since early October  and post prandials (dinner) have been under 180 except on rare occasions. Patient is exercising about 3 times per week and intentionally trying to lose weight .  Patient has had an eye exam in the last 12 months and checks feet regularly for signs of infection.  Patient does not walk barefoot outside,  And denies an numbness tingling or burning in feet. Patient is up to date on all recommended vaccinations  Osteoporosis noted on recent DEXA ,  Has one nonfunctioning kidney due to kidney stones ,  as well as gastric ulcer history  Has been taking 800 mg motrin once daily  As needed.   Used it three times daily for one to two weeks recently when she had a foot fracture.     2 weeks of sinus congestion using allegra  X 2 weeks with  no change .   Left side of face painful     Lab Results  Component Value Date   HGBA1C 7.8 (H) 02/10/2018    Outpatient Medications Prior to Visit  Medication Sig Dispense Refill  . aspirin EC 325 MG tablet  Take 325 mg by mouth daily.    . Cinnamon 500 MG capsule Take by mouth.    . furosemide (LASIX) 40 MG tablet TAKE 1 TABLET DAILY 30 tablet 5  . glucose blood (ONE TOUCH ULTRA TEST) test strip Use as instructed 100 each 12  . IBU 800 MG tablet TAKE 1 TABLET BY MOUTH DAILY AS NEEDED 90 tablet 1  . losartan (COZAAR) 50 MG tablet TAKE 1 TABLET BY MOUTH DAILY 30 tablet 5  . metFORMIN (GLUCOPHAGE) 850 MG tablet Take 1 tablet (850 mg total) by mouth 2 (two) times daily with a meal. 180 tablet 3  . omeprazole (PRILOSEC) 40 MG capsule TAKE 1 CAPSULE BY MOUTH EVERY DAY 30 capsule 4  . potassium chloride (K-DUR) 10 MEQ tablet TAKE ONE TABLET EVERY DAY 90 tablet 1  . rosuvastatin (CRESTOR) 10 MG tablet TAKE ONE TABLET EVERY DAY 90 tablet 1   No facility-administered medications prior to visit.     Review of Systems;  Patient denies headache, fevers, malaise, unintentional weight loss, skin rash, eye pain, sinus congestion and sinus pain, sore throat, dysphagia,  hemoptysis , cough, dyspnea, wheezing, chest pain, palpitations, orthopnea, edema, abdominal pain, nausea, melena, diarrhea, constipation, flank pain, dysuria, hematuria, urinary  Frequency, nocturia, numbness, tingling, seizures,  Focal weakness, Loss of consciousness,  Tremor, insomnia, depression, anxiety, and suicidal ideation.      Objective:  BP 138/64 (BP Location: Left Arm, Patient Position: Sitting, Cuff Size: Large)  Pulse 76   Temp 98.5 F (36.9 C) (Oral)   Resp 15   Ht 5' 3.5" (1.613 m)   Wt 195 lb 9.6 oz (88.7 kg)   SpO2 96%   BMI 34.11 kg/m   BP Readings from Last 3 Encounters:  03/14/18 138/64  02/03/18 (!) 142/98  09/24/17 138/78    Wt Readings from Last 3 Encounters:  03/14/18 195 lb 9.6 oz (88.7 kg)  02/03/18 199 lb 6.4 oz (90.4 kg)  09/24/17 200 lb 12.8 oz (91.1 kg)    General appearance: alert, cooperative and appears stated age Ears: occluded left TM , normal right TM ,  external ear canals both  ears Throat: lips, mucosa, and tongue normal; teeth and gums normal Neck: no adenopathy, no carotid bruit, supple, symmetrical, trachea midline and thyroid not enlarged, symmetric, no tenderness/mass/nodules Back: symmetric, no curvature. ROM normal. No CVA tenderness. Lungs: clear to auscultation bilaterally Heart: regular rate and rhythm, S1, S2 normal, no murmur, click, rub or gallop Abdomen: soft, non-tender; bowel sounds normal; no masses,  no organomegaly Pulses: 2+ and symmetric Skin: Skin color, texture, turgor normal. No rashes or lesions Lymph nodes: Cervical, supraclavicular, and axillary nodes normal.  Lab Results  Component Value Date   HGBA1C 7.8 (H) 02/10/2018   HGBA1C 7.1 (H) 08/05/2017   HGBA1C 6.9 (H) 12/22/2016    Lab Results  Component Value Date   CREATININE 0.94 03/14/2018   CREATININE 0.74 02/10/2018   CREATININE 0.83 02/03/2018    Lab Results  Component Value Date   WBC 7.0 04/20/2016   HGB 14.3 04/20/2016   HCT 42.6 04/20/2016   PLT 168.0 04/20/2016   GLUCOSE 120 (H) 03/14/2018   CHOL 95 02/10/2018   TRIG 197.0 (H) 02/10/2018   HDL 18.50 (L) 02/10/2018   LDLDIRECT 82.0 12/22/2016   LDLCALC 37 02/10/2018   ALT 55 (H) 03/14/2018   AST 42 (H) 03/14/2018   NA 139 03/14/2018   K 4.3 03/14/2018   CL 105 03/14/2018   CREATININE 0.94 03/14/2018   BUN 23 03/14/2018   CO2 25 03/14/2018   TSH 0.76 09/10/2014   INR 1.0 09/10/2014   HGBA1C 7.8 (H) 02/10/2018   MICROALBUR 3.2 (H) 02/10/2018    Dg Bone Density  Result Date: 11/10/2017 EXAM: DUAL X-RAY ABSORPTIOMETRY (DXA) FOR BONE MINERAL DENSITY IMPRESSION: Dear Dr Derrel Nip, Your patient Kimberly Huff completed a BMD test on 11/10/2017 using the [place] (analysis version: 14.10) manufactured by [employer]. The following summarizes the results of our evaluation. PATIENT BIOGRAPHICAL: Name: Kimberly, Huff Patient ID: [id number] Birth Date: 1946-03-11 Height: 63.0 in. Gender: Female Exam Date:  11/10/2017 Weight: 204.0 lbs. Indications: Advanced Age, Caucasian, Diabetic, History of Fracture (Adult), Hysterectomy, Kidney Disease, Postmenopausal, Vitamin D Deficiency Fractures: Left forearm, Right humerus, Toes Treatments: Prilosec ASSESSMENT: The BMD measured at Femur Neck Right is 0.693 g/cm2 with a T-score of -2.5. This patient is considered osteoporotic according to [facility]) criteria. Lumbar spine was not utilized due to advanced degenerative changes. Site Region Measured Measured WHO Young Adult BMD Date       Age      Classification T-score DualFemur Neck Right 11/10/2017 74.1 Osteoporosis -2.5 0.693 g/cm2 DualFemur Total Mean 11/10/2017 74.2 Osteopenia -1.2 0.863 g/cm2 Right Forearm Radius 33% 11/10/2017 74.2 Osteopenia -2.0 0.697 g/cm2 World Health Organization [facility]) criteria for post-menopausal, Caucasian Women: Normal:       T-score at or above -1 SD Osteopenia:   T-score between -1 and -  2.5 SD Osteoporosis: T-score at or below -2.5 SD RECOMMENDATIONS: 1. All patients should optimize calcium and vitamin D intake. 2. Consider FDA-approved medical therapies in postmenopausal women and men aged 77 years and older, based on the following: a. A hip or vertebral(clinical or morphometric) fracture b. T-score < -2.5 at the femoral neck or spine after appropriate evaluation to exclude secondary causes c. Low bone mass (T-score between -1.0 and -2.5 at the femoral neck or spine) and a 10-year probability of a hip fracture > 3% or a 10-year probability of a major osteoporosis-related fracture > 20% based on the [place]-adapted WHO algorithm d. Clinician judgment and/or patient preferences may indicate treatment for people with 10-year fracture probabilities above or below these levels FOLLOW-UP: Patients with diagnosis of osteoporosis or at high risk for fracture should have regular bone mineral density tests. For patients eligible for Medicare, routine testing is allowed once every 2 years.  The testing frequency can be increased to one year for patients who have rapidly progressing disease, those who are receiving or discontinuing medical therapy to restore bone mass, or have additional risk factors. I have reviewed this report, and agree with the above findings. [facility] Electronically Signed   By: Lowella Grip III M.D.   On: 11/10/2017 10:42    Assessment & Plan:   Problem List Items Addressed This Visit    Nonfunctioning left kidney (Chronic)    She has No overt urinary symptoms but  Has had an increase in Cr since April.  Cautioned to avoid overuse of NSAIDS     Lab Results  Component Value Date   CREATININE 0.94 03/14/2018   Lab Results  Component Value Date   NA 139 03/14/2018   K 4.3 03/14/2018   CL 105 03/14/2018   CO2 25 03/14/2018         Acute non-recurrent frontal sinusitis     Will start treatment with Augmentin today. Return precautions given.       Relevant Medications   amoxicillin-clavulanate (AUGMENTIN) 875-125 MG tablet   Benign essential HTN    Well controlled for age on current regimen. Renal function stable, no changes today.  Lab Results  Component Value Date   CREATININE 0.94 03/14/2018   Lab Results  Component Value Date   NA 139 03/14/2018   K 4.3 03/14/2018   CL 105 03/14/2018   CO2 25 03/14/2018         Dyslipidemia associated with type 2 diabetes mellitus ([place])    HDL has continued to drop with loss of control of DM. I have addressed  With recommended use of  a low glycemic index diet and regular exercise a minimum of 5 days per week.        Relevant Medications   glipiZIDE (GLUCOTROL) 5 MG tablet   Microalbuminuria due to type 2 diabetes mellitus ([place])    Managed with losartan   Lab Results  Component Value Date   MICROALBUR 3.2 (H) 02/10/2018         Relevant Medications   glipiZIDE (GLUCOTROL) 5 MG tablet   NASH (nonalcoholic steatohepatitis)    Confirmed with biopsy 2012,  With stage III  fibrosis.  She is up to date on vaccines.  Taking statin for management of hyperlipidemia.  Diabetes and obesity addressed with medications and Low GI diet . LFTS are mildly elevated.  Annual ultrasound has been done in April.    Lab Results  Component Value Date   ALT 55 (H) 03/14/2018  AST 42 (H) 03/14/2018   ALKPHOS 71 03/14/2018   BILITOT 0.6 03/14/2018           Osteoporosis - Primary    She has contraindications to alendronate and Evista.  Will obtain PA for Prolia       Type 2 diabetes mellitus, controlled, with renal complications ([place])    Loss of control noted recently on metformin alone.  . Adding 2.5 mg glipizide before dinner.  Patient is up to date on  annual eye exam  And has early  Microalbuminuria. She is  tolerating an  ACE/ARB for renal protection and hypertension .   Lab Results  Component Value Date   HGBA1C 7.8 (H) 02/10/2018   Lab Results  Component Value Date   MICROALBUR 3.2 (H) 02/10/2018             Relevant Medications   glipiZIDE (GLUCOTROL) 5 MG tablet    Other Visit Diagnoses    Fatty liver       Relevant Orders   Comprehensive metabolic panel (Completed)   Hearing loss of left ear due to cerumen impaction       Relevant Orders   Ambulatory referral to ENT     A total of 40 minutes was spent with patient more than half of which was spent in counseling patient on the above mentioned issues , reviewing and explaining recent labs and imaging studies done, and coordination of care.  I am having [name] "[name]" start on denosumab, glipiZIDE, and amoxicillin-clavulanate. I am also having her maintain her aspirin EC, Cinnamon, potassium chloride, furosemide, omeprazole, metFORMIN, rosuvastatin, IBU, glucose blood, and losartan.  Meds ordered this encounter  Medications  . denosumab (PROLIA) 60 MG/ML SOSY injection    Sig: Inject 60 mg into the skin once for 1 dose.    Dispense:  1 mL    Refill:  0  . glipiZIDE (GLUCOTROL) 5 MG  tablet    Sig: Take 0.5 tablets (2.5 mg total) by mouth daily before supper.    Dispense:  45 tablet    Refill:  3  . amoxicillin-clavulanate (AUGMENTIN) 875-125 MG tablet    Sig: Take 1 tablet by mouth 2 (two) times daily.    Dispense:  14 tablet    Refill:  0    There are no discontinued medications.  Follow-up: Return in about 3 months (around 06/14/2018) for follow up diabetes.   [name], MD

## 2018-03-14 NOTE — Patient Instructions (Addendum)
1)  I am treating you for sinusitis/otitis which is a complication from your viral infection due to  persistent sinus congestion.   I am prescribing an antibiotic (augmentin ) to manage the infection and the inflammation in your ear/sinuses.   I also advise use of the following OTC meds to help with your other symptoms.   Take generic  Sudafed PE  10 to 30 mg every 8 hours for the congestion, you may substitute Afrin nasal spray for the nighttime dose of sudafed PE  If needed to prevent insomnia.  flush your sinuses twice daily with Simply Saline (do over the sink because if you do it right you will spit out globs of mucus)   2)   You have an earwax accumulation on your left side.  I will make the ENT referral.    3)   am adding 2.5 mg glipizide before your evening meal .  contiue the metformin  continue to check your blood sugar at various times Let me know if BS in the am fasting  Become < 120 on a regular basis.   We should repeat your A1c on or after Jan 10   Try the SOLA low carb bread Kristopher Oppenheim  In the  Frozen section )  3 net carbs per slice .    Tastes great! Marland Kitchen   Marland Kitchen  4)  We are repeating your liver enzymes today   5) I am looking into Prolia injection for management of your osteoporosis

## 2018-03-15 LAB — COMPREHENSIVE METABOLIC PANEL
ALT: 55 U/L — ABNORMAL HIGH (ref 0–35)
AST: 42 U/L — ABNORMAL HIGH (ref 0–37)
Albumin: 4.6 g/dL (ref 3.5–5.2)
Alkaline Phosphatase: 71 U/L (ref 39–117)
BUN: 23 mg/dL (ref 6–23)
CO2: 25 mEq/L (ref 19–32)
Calcium: 10.2 mg/dL (ref 8.4–10.5)
Chloride: 105 mEq/L (ref 96–112)
Creatinine, Ser: 0.94 mg/dL (ref 0.40–1.20)
GFR: 61.78 mL/min (ref >60.00)
Glucose, Bld: 120 mg/dL — ABNORMAL HIGH (ref 70–99)
Potassium: 4.3 mEq/L (ref 3.5–5.1)
Sodium: 139 mEq/L (ref 135–145)
Total Bilirubin: 0.6 mg/dL (ref 0.2–1.2)
Total Protein: 7.2 g/dL (ref 6.0–8.3)

## 2018-03-15 NOTE — Assessment & Plan Note (Addendum)
Confirmed with biopsy 2012,  With stage III fibrosis.  She is up to date on vaccines.  Taking statin for management of hyperlipidemia.  Diabetes and obesity addressed with medications and Low GI diet . LFTS are mildly elevated.  Annual ultrasound has been done in April.    Lab Results  Component Value Date   ALT 55 (H) 03/14/2018   AST 42 (H) 03/14/2018   ALKPHOS 71 03/14/2018   BILITOT 0.6 03/14/2018

## 2018-03-15 NOTE — Assessment & Plan Note (Addendum)
Will start treatment with Augmentin today. Return precautions given.

## 2018-03-15 NOTE — Assessment & Plan Note (Signed)
Managed with losartan   Lab Results  Component Value Date   MICROALBUR 3.2 (H) 02/10/2018

## 2018-03-15 NOTE — Assessment & Plan Note (Addendum)
HDL has continued to drop with loss of control of DM. I have addressed  With recommended use of  a low glycemic index diet and regular exercise a minimum of 5 days per week.

## 2018-03-15 NOTE — Assessment & Plan Note (Addendum)
She has No overt urinary symptoms but  Has had an increase in Cr since April.  Cautioned to avoid overuse of NSAIDS     Lab Results  Component Value Date   CREATININE 0.94 03/14/2018   Lab Results  Component Value Date   NA 139 03/14/2018   K 4.3 03/14/2018   CL 105 03/14/2018   CO2 25 03/14/2018

## 2018-03-15 NOTE — Assessment & Plan Note (Addendum)
Loss of control noted recently on metformin alone.  . Adding 2.5 mg glipizide before dinner.  Patient is up to date on  annual eye exam  And has early  Microalbuminuria. She is  tolerating an  ACE/ARB for renal protection and hypertension .   Lab Results  Component Value Date   HGBA1C 7.8 (H) 02/10/2018   Lab Results  Component Value Date   MICROALBUR 3.2 (H) 02/10/2018

## 2018-03-15 NOTE — Assessment & Plan Note (Signed)
Well controlled for age on current regimen. Renal function stable, no changes today.  Lab Results  Component Value Date   CREATININE 0.94 03/14/2018   Lab Results  Component Value Date   NA 139 03/14/2018   K 4.3 03/14/2018   CL 105 03/14/2018   CO2 25 03/14/2018

## 2018-03-15 NOTE — Assessment & Plan Note (Signed)
She has contraindications to alendronate and Evista.  Will obtain PA for Prolia

## 2018-03-18 NOTE — Progress Notes (Signed)
Verification for Prolia has been started on amgen portal.

## 2018-03-21 ENCOUNTER — Other Ambulatory Visit: Payer: Self-pay

## 2018-03-21 NOTE — Patient Outreach (Signed)
[place] [facility]) Care Management  03/21/2018  AYRIANA WIX 07-28-1945 [id number]   Referral Date: 03/21/18 Referral Source: Nurseline Referral Reason: Injection site redness and soreness   Outreach Attempt: no answer.  HIPAA compliant voice message left.    Plan: RN CM will attempt again within 4 business days and send letter.     [name], RN, MSN [facility] Care Management Care Management Coordinator Direct Line [phone] Toll Free: [phone]  Fax: [phone]

## 2018-03-22 ENCOUNTER — Telehealth: Payer: Self-pay | Admitting: Internal Medicine

## 2018-03-22 NOTE — Telephone Encounter (Signed)
Prolia has been approved, patient will owe 20% of injection cost $ 90 for six months, patient agreed and has been scheduled for 04/20/18. FYI

## 2018-03-22 NOTE — Telephone Encounter (Signed)
Thank you Kathy!

## 2018-03-23 ENCOUNTER — Other Ambulatory Visit: Payer: Self-pay

## 2018-03-23 NOTE — Patient Outreach (Signed)
[place] [facility]) Care Management  03/23/2018  MIRAH NEVINS 1946/04/22 [id number]   Referral Date: 03/21/18 Referral Source: Nurseline Referral Reason: Injection site redness and soreness   Outreach Attempt: no answer.  HIPAA compliant voice message left.    Plan: RN CM will attempt again within 4 business days.  [name], RN, MSN [place] Management Care Management Coordinator Direct Line [phone] Cell [phone] Toll Free: [phone]  Fax: [phone]

## 2018-03-24 ENCOUNTER — Other Ambulatory Visit: Payer: Self-pay

## 2018-03-24 NOTE — Patient Outreach (Signed)
[place] [facility]) Care Management  03/24/2018  Kimberly Huff 04/12/1946 [id number]   Referral Date:03/21/18 Referral Source:Nurseline Referral Reason:Injection site redness and soreness   Outreach Attempt:no answer. HIPAA compliant voice message left.    Plan:RN CM will wait return call.  If no return call will close case.    [name], RN, MSN [place] Management Care Management Coordinator Direct Line [phone] Cell [phone] Toll Free: [phone]  Fax: [phone]

## 2018-03-29 ENCOUNTER — Other Ambulatory Visit: Payer: Self-pay | Admitting: Internal Medicine

## 2018-03-30 ENCOUNTER — Other Ambulatory Visit: Payer: Self-pay | Admitting: Internal Medicine

## 2018-03-30 MED ORDER — TRAMADOL HCL 50 MG PO TABS: 50.0000 mg | ORAL_TABLET | Freq: Four times a day (QID) | ORAL | 0 refills | Status: DC | PRN

## 2018-04-04 ENCOUNTER — Other Ambulatory Visit: Payer: Self-pay

## 2018-04-04 NOTE — Patient Outreach (Signed)
[place] [facility]) Care Management  04/04/2018  AMIAYAH GIEBEL [birth year] [id number]   Multiple attempts to establish contact with patient without success. No response from letter mailed to patient.   Plan: RN CM will close case at this time.   [name], RN, MSN [place] Management Care Management Coordinator Direct Line [phone] Cell [phone] Toll Free: [phone]  Fax: [phone]

## 2018-04-06 DIAGNOSIS — H903 Sensorineural hearing loss, bilateral: Secondary | ICD-10-CM | POA: Diagnosis not present

## 2018-04-06 DIAGNOSIS — H6123 Impacted cerumen, bilateral: Secondary | ICD-10-CM | POA: Diagnosis not present

## 2018-04-11 ENCOUNTER — Other Ambulatory Visit: Payer: Self-pay | Admitting: Internal Medicine

## 2018-04-20 ENCOUNTER — Ambulatory Visit (INDEPENDENT_AMBULATORY_CARE_PROVIDER_SITE_OTHER): Payer: PPO | Admitting: *Deleted

## 2018-04-20 DIAGNOSIS — M810 Age-related osteoporosis without current pathological fracture: Secondary | ICD-10-CM

## 2018-04-20 MED ORDER — DENOSUMAB 60 MG/ML ~~LOC~~ SOSY
60.0000 mg | PREFILLED_SYRINGE | Freq: Once | SUBCUTANEOUS | Status: AC
  Administered 2018-04-20: 60 mg via SUBCUTANEOUS

## 2018-04-20 NOTE — Progress Notes (Signed)
Patient presented for Prolia injection to Left arm [place], patient voiced no concerns or complaints during or after injection.

## 2018-06-01 ENCOUNTER — Telehealth: Payer: Self-pay | Admitting: Internal Medicine

## 2018-06-01 DIAGNOSIS — N181 Chronic kidney disease, stage 1: Secondary | ICD-10-CM

## 2018-06-01 DIAGNOSIS — E1122 Type 2 diabetes mellitus with diabetic chronic kidney disease: Secondary | ICD-10-CM

## 2018-06-01 DIAGNOSIS — E1169 Type 2 diabetes mellitus with other specified complication: Secondary | ICD-10-CM

## 2018-06-01 DIAGNOSIS — E785 Hyperlipidemia, unspecified: Secondary | ICD-10-CM

## 2018-06-01 NOTE — Telephone Encounter (Signed)
Pt made a follow up appt. Pt would like to have labs done before appt. Order is needed please and Thank you!

## 2018-06-06 NOTE — Telephone Encounter (Signed)
Pt is scheduled for a follow up appt in March. Ordered A1c, lipid, and CMP. Is there anything else that needs to be ordered?

## 2018-06-06 NOTE — Telephone Encounter (Signed)
No that's great thanks

## 2018-06-07 NOTE — Telephone Encounter (Signed)
Called pt and scheduled a fasting lab appt. Pt is aware of appt date and time.

## 2018-06-10 ENCOUNTER — Ambulatory Visit (INDEPENDENT_AMBULATORY_CARE_PROVIDER_SITE_OTHER): Payer: PPO

## 2018-06-10 VITALS — BP 122/62 | HR 78 | Temp 98.2°F | Resp 16 | Ht 62.25 in | Wt 188.4 lb

## 2018-06-10 DIAGNOSIS — Z0000 Encounter for general adult medical examination without abnormal findings: Secondary | ICD-10-CM

## 2018-06-10 NOTE — Progress Notes (Addendum)
Subjective:   Kimberly Huff is a 73 y.o. female who presents for Medicare Annual (Subsequent) preventive examination.  Review of Systems:  No ROS.  Medicare Wellness Visit. Additional risk factors are reflected in the social history. Cardiac Risk Factors include: advanced age (>72mn, >>37women);diabetes mellitus;hypertension     Objective:     Vitals: BP 122/62 (BP Location: Left Arm, Patient Position: Sitting, Cuff Size: Normal)   Pulse 78   Temp 98.2 F (36.8 C) (Oral)   Resp 16   Ht 5' 2.25" (1.581 m)   Wt 188 lb 6.4 oz (85.5 kg)   SpO2 94%   BMI 34.18 kg/m   Body mass index is 34.18 kg/m.  Advanced Directives 06/10/2018 10/29/2015 10/15/2015 10/15/2015  Does Patient Have a Medical Advance Directive? No Yes Yes No  Type of Advance Directive - Healthcare Power of [place]-  Does patient want to make changes to medical advance directive? - No - Patient declined No - Patient declined -  Copy of [place]? - Yes No - copy requested -  Would patient like information on creating a medical advance directive? No - Patient declined No - patient declined information - Yes - EScientist, clinical (histocompatibility and immunogenetics)given    Tobacco Social History   Tobacco Use  Smoking Status Former Smoker  . Types: Cigarettes  . Last attempt to quit: 09/15/1998  . Years since quitting: 19.7  Smokeless Tobacco Never Used     Counseling given: Not Answered   Clinical Intake:  Pre-visit preparation completed: Yes        Diabetes: Yes(Followed by pcp)  How often do you need to have someone help you when you read instructions, pamphlets, or other written materials from your doctor or pharmacy?: 1 - Never  Interpreter Needed?: No     Past Medical History:  Diagnosis Date  . Allergy   . Atrophic kidney    left  . Blood transfusion   . Blood transfusion without reported diagnosis   . Cataract   . Clotting disorder (HCC)    blood clots in  abdomen after surgery  . Coronary artery disease   . Coronary atherosclerosis   . Diabetes mellitus   . Diverticulosis 2005   Dr SJamal Collin . External hemorrhoids without mention of complication 26468 . GERD (gastroesophageal reflux disease)   . H/O cardiac catheterization 2005  . Heart murmur 2012   found by Dr. SJamal Collin . Hyperlipidemia   . Hypertension   . Hypopotassemia   . Kidney stone   . Myocardial infarction ([place] 2000  . Neuromuscular disorder ([facility]    "achy muscles"  . Obesity   . Osteoarthritis   . Recurrent UTI   . Spinal stenosis of cervical region   . Steatohepatitis 05/30/10   Brunt Stage 3, non alcoholic cirrhosis of the liver  . Type II or unspecified type diabetes mellitus without mention of complication, uncontrolled    Patient does takes Metformin and recommended to stop for 48 hours.  .Marland KitchenUlcer   . Unspecified essential hypertension 2014   Past Surgical History:  Procedure Laterality Date  . ABDOMINAL HYSTERECTOMY  1982   complete  . BREAST CYST ASPIRATION Right 1990's   benign  . BREAST MASS EXCISION     benign  . CARDIAC CATHETERIZATION  2000   cardiac stent  . cardiac stents  2000  . CATARACT EXTRACTION     both eyes  . COLONOSCOPY  2004, 2015   Dr. Jamal Collin, Dr. Olevia Perches  . CYSTOSCOPY W/ RETROGRADES Left 11/12/2015   Procedure: CYSTOSCOPY WITH RETROGRADE PYELOGRAM;  Surgeon: Cleon Gustin, MD;  Location: [facility];  Service: Urology;  Laterality: Left;  . CYSTOSCOPY W/ URETERAL STENT PLACEMENT Left 11/12/2015   Procedure: CYSTOSCOPY WITH STENT REPLACEMENT;  Surgeon: Cleon Gustin, MD;  Location: [facility];  Service: Urology;  Laterality: Left;  . CYSTOSCOPY WITH URETEROSCOPY AND STENT PLACEMENT Left 10/15/2015   Procedure: CYSTOSCOPY WITH URETEROSCOPY AND STENT PLACEMENT;  Surgeon: Cleon Gustin, MD;  Location: [facility];  Service: Urology;  Laterality: Left;  . EYE SURGERY Bilateral 2012   cataract  . FOOT SURGERY  2008  . PTCA  2000  .  SALPINGOOPHORECTOMY    . TONSILLECTOMY    . UPPER GI ENDOSCOPY    . URETEROSCOPY WITH HOLMIUM LASER LITHOTRIPSY Left 11/12/2015   Procedure: URETEROSCOPY WITH HOLMIUM LASER LITHOTRIPSY;  Surgeon: Cleon Gustin, MD;  Location: [facility];  Service: Urology;  Laterality: Left;  . URETEROTOMY  1960/1969   x 2 during childhood  . VAGINAL HYSTERECTOMY     PT DENIES ABD HYSTERECTOMY   Family History  Problem Relation Age of Onset  . Lung cancer Father   . Heart disease Father   . Diverticulosis Mother   . Diabetes Mother   . Stroke Mother 8  . Autoimmune disease Daughter   . Thyroid disease Daughter   . Thyroid disease Sister   . Stomach cancer Maternal Aunt   . Breast cancer Maternal Aunt   . Colon cancer Neg Hx   . Esophageal cancer Neg Hx   . Rectal cancer Neg Hx    Social History   Socioeconomic History  . Marital status: Single    Spouse name: Not on file  . Number of children: 2  . Years of education: Not on file  . Highest education level: Not on file  Occupational History  . 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 Financial resource strain: Not hard at all  . Food insecurity:    Worry: Never true    Inability: Never true  . Transportation needs:    Medical: No    Non-medical: No  Tobacco Use  . Smoking status: Former Smoker    Types: Cigarettes    Last attempt to quit: 09/15/1998    Years since quitting: 19.7  . Smokeless tobacco: Never Used  Substance and Sexual Activity  . Alcohol use: Yes    Comment: very rare  . Drug use: No  . Sexual activity: Not Currently  Lifestyle  . Physical activity:    Days per week: 0 days    Minutes per session: Not on file  . Stress: Not on file  Relationships  . Social connections:    Talks on phone: Not on file    Gets together: Not on file    Attends religious service: Not on file    Active member of club or organization: Not on file    Attends meetings of clubs or  organizations: Not on file    Relationship status: Not on file  Other Topics Concern  . Not on file  Social History Narrative  . Not on file    Outpatient Encounter Medications as of 06/10/2018  Medication Sig  . aspirin EC 325 MG tablet Take 325 mg by mouth daily.  . Cinnamon 500 MG capsule Take by mouth.  Marland Kitchen  furosemide (LASIX) 40 MG tablet TAKE 1 TABLET DAILY  . glucose blood (ONE TOUCH ULTRA TEST) test strip Use as instructed  . losartan (COZAAR) 50 MG tablet TAKE 1 TABLET BY MOUTH DAILY  . metFORMIN (GLUCOPHAGE) 850 MG tablet Take 1 tablet (850 mg total) by mouth 2 (two) times daily with a meal.  . omeprazole (PRILOSEC) 40 MG capsule TAKE 1 CAPSULE BY MOUTH ONCE DAILY  . potassium chloride (K-DUR) 10 MEQ tablet TAKE ONE TABLET EVERY DAY  . rosuvastatin (CRESTOR) 10 MG tablet TAKE ONE TABLET EVERY DAY  . traMADol (ULTRAM) 50 MG tablet Take 1 tablet (50 mg total) by mouth every 6 (six) hours as needed.  . [DISCONTINUED] amoxicillin-clavulanate (AUGMENTIN) 875-125 MG tablet Take 1 tablet by mouth 2 (two) times daily.  . [DISCONTINUED] glipiZIDE (GLUCOTROL) 5 MG tablet Take 0.5 tablets (2.5 mg total) by mouth daily before supper.  . [DISCONTINUED] IBU 800 MG tablet TAKE 1 TABLET BY MOUTH DAILY AS NEEDED (Patient not taking: Reported on 06/10/2018)   No facility-administered encounter medications on file as of 06/10/2018.     Activities of Daily Living In your present state of health, do you have any difficulty performing the following activities: 06/10/2018  Hearing? N  Vision? N  Difficulty concentrating or making decisions? N  Walking or climbing stairs? N  Dressing or bathing? N  Doing errands, shopping? N  Preparing Food and eating ? N  Using the Toilet? N  In the past six months, have you accidently leaked urine? N  Do you have problems with loss of bowel control? N  Managing your Medications? N  Managing your Finances? N  Housekeeping or managing your Housekeeping? N  Some  recent data might be hidden    Patient Care Team: [name], MD as PCP - General (Internal Medicine) [name], MD (General Surgery) [name], MD (Internal Medicine)    Assessment:   This is a routine wellness examination for Kimberly Huff.  Health Screenings  Mammogram -07/16/17 Colonoscopy 06/20/13 Bone Density -11/10/17 Glaucoma -none Hearing -demonstrates normal hearing during conversation Hemoglobin A1C -02/10/18 (7.8) Cholesterol -02/10/18 (95) Dental-every 12 months Vision-every 12 months   Social  Alcohol intake -yes, rare Smoking history- former Smokers in home? none Illicit drug use? none Exercise -none Diet -rare Sexually Active -not currently  Safety  Patient feels safe at home.  Patient does have smoke detectors at home  Patient does wear sunscreen or protective clothing when in direct sunlight  Patient does wear seat belt when driving or riding with others.   Activities of Daily Living Patient can do their own household chores. Denies needing assistance with: driving, feeding themselves, getting from bed to chair, getting to the toilet, bathing/showering, dressing, managing money, climbing flight of stairs, or preparing meals.   Depression Screen Patient denies losing interest in daily life, feeling hopeless, or crying easily over simple problems.   Fall Screen Patient denies being afraid of falling or falling in the last year.   Memory Screen Patient denies problems with memory, misplacing items, and is able to balance checkbook/bank accounts.  Patient is alert, normal appearance, oriented to person/place/and time. Correctly identified the president of the [place], recall of 2/3 objects, and performing simple calculations.  Patient displays appropriate judgement and can read correct time from watch face.   Immunizations The following Immunizations are up to date: Influenza, shingles, pneumonia, and tetanus.   Other Providers Patient  Care Team: [name], MD as PCP -  General (Internal Medicine) [name], MD (General Surgery) [name], MD (Internal Medicine)  Exercise Activities and Dietary recommendations Current Exercise Habits: The patient does not participate in regular exercise at present  Goals      Patient Stated   . Increase physical activity (pt-stated)     Walk for exercise       Fall Risk Fall Risk  06/10/2018 12/24/2016 05/01/2016  Falls in the past year? 0 No No    Depression Screen PHQ 2/9 Scores 06/10/2018 03/14/2018 12/24/2016  PHQ - 2 Score 0 0 1  PHQ- 9 Score - 3 8     Cognitive Function     6CIT Screen 06/10/2018  What Year? 0 points  What month? 0 points  What time? 0 points  Count back from 20 0 points  Months in reverse 0 points  Repeat phrase 0 points  Total Score 0    Immunization History  Administered Date(s) Administered  . Hepatitis B 02/16/2011  . Influenza, High Dose Seasonal PF 03/18/2018  . Influenza,inj,Quad PF,6+ Mos 02/22/2013, 03/07/2014  . Influenza-Unspecified 02/05/2015, 02/17/2016  . Pneumococcal Conjugate-13 03/07/2014  . Pneumococcal Polysaccharide-23 03/16/2013  . Tdap 02/22/2013   Screening Tests Health Maintenance  Topic Date Due  . OPHTHALMOLOGY EXAM  10/02/2017  . COLONOSCOPY  06/20/2018  . HEMOGLOBIN A1C  08/12/2018  . FOOT EXAM  09/25/2018  . MAMMOGRAM  07/17/2019  . TETANUS/TDAP  02/23/2023  . INFLUENZA VACCINE  Completed  . DEXA SCAN  Completed  . Hepatitis C Screening  Completed  . PNA vac Low Risk Adult  Completed       Plan:   End of life planning; Advanced aging; Advanced directives discussed.  No HCPOA/Living Will.  Additional information declined at this time.  I have personally reviewed and noted the following in the patient's chart:   . Medical and social history . Use of alcohol, tobacco or illicit drugs  . Current medications and supplements . Functional ability and status . Nutritional  status . Physical activity . Advanced directives . List of other physicians . Hospitalizations, surgeries, and ER visits in previous 12 months . Vitals . Screenings to include cognitive, depression, and falls . Referrals and appointments  In addition, I have reviewed and discussed with patient certain preventive protocols, quality metrics, and best practice recommendations. A written personalized care plan for preventive services as well as general preventive health recommendations were provided to patient.     Kimberly Huff, Kimberly Goeser L, LPN  08/06/8481    I have reviewed the above information and agree with above.   Deborra Medina, MD

## 2018-06-10 NOTE — Patient Instructions (Addendum)
  Ms. Rothrock , Thank you for taking time to come for your Medicare Wellness Visit. I appreciate your ongoing commitment to your health goals. Please review the following plan we discussed and let me know if I can assist you in the future.   These are the goals we discussed: Goals      Patient Stated   . Increase physical activity (pt-stated)     Walk for exercise       This is a list of the screening recommended for you and due dates:  Health Maintenance  Topic Date Due  . Eye exam for diabetics  10/02/2017  . Colon Cancer Screening  06/20/2018  . Hemoglobin A1C  08/12/2018  . Complete foot exam   09/25/2018  . Mammogram  07/17/2019  . Tetanus Vaccine  02/23/2023  . Flu Shot  Completed  . DEXA scan (bone density measurement)  Completed  .  Hepatitis C: One time screening is recommended by Center for Disease Control  (CDC) for  adults born from [birth year] through 1967.   Completed  . Pneumonia vaccines  Completed

## 2018-07-06 ENCOUNTER — Other Ambulatory Visit: Payer: Self-pay | Admitting: Internal Medicine

## 2018-07-08 ENCOUNTER — Other Ambulatory Visit (INDEPENDENT_AMBULATORY_CARE_PROVIDER_SITE_OTHER): Payer: PPO

## 2018-07-08 DIAGNOSIS — E1169 Type 2 diabetes mellitus with other specified complication: Secondary | ICD-10-CM | POA: Diagnosis not present

## 2018-07-08 DIAGNOSIS — E1122 Type 2 diabetes mellitus with diabetic chronic kidney disease: Secondary | ICD-10-CM

## 2018-07-08 DIAGNOSIS — E785 Hyperlipidemia, unspecified: Secondary | ICD-10-CM | POA: Diagnosis not present

## 2018-07-08 DIAGNOSIS — N181 Chronic kidney disease, stage 1: Secondary | ICD-10-CM

## 2018-07-08 LAB — COMPREHENSIVE METABOLIC PANEL
ALT: 27 U/L (ref 0–35)
AST: 20 U/L (ref 0–37)
Albumin: 4.5 g/dL (ref 3.5–5.2)
Alkaline Phosphatase: 37 U/L — ABNORMAL LOW (ref 39–117)
BUN: 24 mg/dL — ABNORMAL HIGH (ref 6–23)
CO2: 29 mEq/L (ref 19–32)
Calcium: 10 mg/dL (ref 8.4–10.5)
Chloride: 102 mEq/L (ref 96–112)
Creatinine, Ser: 0.76 mg/dL (ref 0.40–1.20)
GFR: 74.22 mL/min (ref >60.00)
Glucose, Bld: 143 mg/dL — ABNORMAL HIGH (ref 70–99)
Potassium: 4.5 mEq/L (ref 3.5–5.1)
Sodium: 139 mEq/L (ref 135–145)
Total Bilirubin: 0.5 mg/dL (ref 0.2–1.2)
Total Protein: 7.2 g/dL (ref 6.0–8.3)

## 2018-07-08 LAB — HEMOGLOBIN A1C: Hgb A1c MFr Bld: 6.3 % (ref 4.6–6.5)

## 2018-07-08 LAB — LIPID PANEL
Cholesterol: 92 mg/dL (ref 0–200)
HDL: 23.7 mg/dL — ABNORMAL LOW (ref >39.00)
LDL Cholesterol: 38 mg/dL (ref 0–99)
NonHDL: 68.6
Total CHOL/HDL Ratio: 4
Triglycerides: 153 mg/dL — ABNORMAL HIGH (ref 0.0–149.0)
VLDL: 30.6 mg/dL (ref 0.0–40.0)

## 2018-07-12 ENCOUNTER — Ambulatory Visit (INDEPENDENT_AMBULATORY_CARE_PROVIDER_SITE_OTHER): Payer: PPO | Admitting: Internal Medicine

## 2018-07-12 ENCOUNTER — Encounter: Payer: Self-pay | Admitting: Internal Medicine

## 2018-07-12 VITALS — BP 112/70 | HR 75 | Temp 98.2°F | Wt 188.0 lb

## 2018-07-12 DIAGNOSIS — I10 Essential (primary) hypertension: Secondary | ICD-10-CM

## 2018-07-12 DIAGNOSIS — K7581 Nonalcoholic steatohepatitis (NASH): Secondary | ICD-10-CM

## 2018-07-12 DIAGNOSIS — R109 Unspecified abdominal pain: Secondary | ICD-10-CM

## 2018-07-12 DIAGNOSIS — E1169 Type 2 diabetes mellitus with other specified complication: Secondary | ICD-10-CM | POA: Diagnosis not present

## 2018-07-12 DIAGNOSIS — E785 Hyperlipidemia, unspecified: Secondary | ICD-10-CM | POA: Diagnosis not present

## 2018-07-12 DIAGNOSIS — Z23 Encounter for immunization: Secondary | ICD-10-CM | POA: Diagnosis not present

## 2018-07-12 DIAGNOSIS — E1121 Type 2 diabetes mellitus with diabetic nephropathy: Secondary | ICD-10-CM | POA: Diagnosis not present

## 2018-07-12 DIAGNOSIS — G8929 Other chronic pain: Secondary | ICD-10-CM

## 2018-07-12 LAB — URINALYSIS, ROUTINE W REFLEX MICROSCOPIC
Bilirubin Urine: NEGATIVE
Hgb urine dipstick: NEGATIVE
Ketones, ur: NEGATIVE
Nitrite: NEGATIVE
Specific Gravity, Urine: 1.02 (ref 1.000–1.030)
Total Protein, Urine: NEGATIVE
Urine Glucose: NEGATIVE
Urobilinogen, UA: 0.2 (ref 0.0–1.0)
pH: 5.5 (ref 5.0–8.0)

## 2018-07-12 MED ORDER — ETODOLAC ER 400 MG PO TB24: 400.0000 mg | ORAL_TABLET | Freq: Every day | ORAL | 3 refills | Status: DC

## 2018-07-12 MED ORDER — ZOSTER VAC RECOMB ADJUVANTED 50 MCG/0.5ML IM SUSR: 0.5000 mL | Freq: Once | INTRAMUSCULAR | 1 refills | Status: AC

## 2018-07-12 NOTE — Progress Notes (Signed)
Subjective:  Patient ID: Kimberly Huff, female    DOB: 1945/06/23  Age: 73 y.o. MRN: [id number]  CC: The primary encounter diagnosis was Chronic flank pain. Diagnoses of Need for 23-polyvalent pneumococcal polysaccharide vaccine, Benign essential HTN, Dyslipidemia associated with type 2 diabetes mellitus ([place]), NASH (nonalcoholic steatohepatitis), and Controlled type 2 diabetes mellitus with diabetic nephropathy, without long-term current use of insulin ([place]) were also pertinent to this visit.  HPI Kimberly Huff presents for 3 month follow up on diabetes and other related conditions.  Patient has no complaints today.  Patient is following a low glycemic index diet and taking all prescribed medications regularly without side effects.  Fasting sugars have been under less than 140 most of the time and post prandials have been under 150 except on rare occasions. Patient is exercising about 3 times per week and intentionally trying to lose weight .  Patient has had an eye exam in the last 12 months and checks feet regularly for signs of infection.  Patient does not walk barefoot outside,  And denies an numbness tingling or burning in feet. Patient is up to date on all recommended vaccinations  Still has left flank pain with history  of left stone removed via cystoscopy and ureteral stent in June 2017 by Aline August .  Left kidney functions at 10% .  Has not seen urology Arletha Grippe at [facility]  Since the ureteral stent was removed   Labs reviewed.  All improved,  Needs annual mammogram.   Needs GI referral to Dr Allen Norris  For r colonsocopy and liver follow up  CAD s/p AMI 20 yrs ago  Quit smoking then   Outpatient Medications Prior to Visit  Medication Sig Dispense Refill  . aspirin EC 325 MG tablet Take 325 mg by mouth daily.    . Cinnamon 500 MG capsule Take by mouth.    . furosemide (LASIX) 40 MG tablet TAKE 1 TABLET DAILY 30 tablet 5  . glucose blood (ONE TOUCH ULTRA TEST) test strip Use  as instructed 100 each 12  . losartan (COZAAR) 50 MG tablet TAKE 1 TABLET BY MOUTH DAILY 30 tablet 5  . metFORMIN (GLUCOPHAGE) 850 MG tablet Take 1 tablet (850 mg total) by mouth 2 (two) times daily with a meal. 180 tablet 3  . omeprazole (PRILOSEC) 40 MG capsule TAKE 1 CAPSULE BY MOUTH ONCE DAILY 30 capsule 4  . potassium chloride (K-DUR) 10 MEQ tablet TAKE ONE TABLET EVERY DAY 90 tablet 1  . rosuvastatin (CRESTOR) 10 MG tablet TAKE 1 TABLET BY MOUTH DAILY 90 tablet 1  . traMADol (ULTRAM) 50 MG tablet Take 1 tablet (50 mg total) by mouth every 6 (six) hours as needed. (Patient not taking: Reported on 07/12/2018) 30 tablet 0   No facility-administered medications prior to visit.     Review of Systems;  Patient denies headache, fevers, malaise, unintentional weight loss, skin rash, eye pain, sinus congestion and sinus pain, sore throat, dysphagia,  hemoptysis , cough, dyspnea, wheezing, chest pain, palpitations, orthopnea, edema, abdominal pain, nausea, melena, diarrhea, constipation, flank pain, dysuria, hematuria, urinary  Frequency, nocturia, numbness, tingling, seizures,  Focal weakness, Loss of consciousness,  Tremor, insomnia, depression, anxiety, and suicidal ideation.      Objective:  BP 112/70 (BP Location: Left Arm, Patient Position: Sitting, Cuff Size: Large)   Pulse 75   Temp 98.2 F (36.8 C)   Wt 188 lb (85.3 kg)   SpO2 97%   BMI 34.11 kg/m  BP Readings from Last 3 Encounters:  07/12/18 112/70  06/10/18 122/62  03/14/18 138/64    Wt Readings from Last 3 Encounters:  07/12/18 188 lb (85.3 kg)  06/10/18 188 lb 6.4 oz (85.5 kg)  03/14/18 195 lb 9.6 oz (88.7 kg)    General appearance: alert, cooperative and appears stated age Ears: normal TM's and external ear canals both ears Throat: lips, mucosa, and tongue normal; teeth and gums normal Neck: no adenopathy, no carotid bruit, supple, symmetrical, trachea midline and thyroid not enlarged, symmetric, no  tenderness/mass/nodules Back: symmetric, no curvature. ROM normal. No CVA tenderness. Lungs: clear to auscultation bilaterally Heart: regular rate and rhythm, S1, S2 normal, no murmur, click, rub or gallop Abdomen: soft, non-tender; bowel sounds normal; no masses,  no organomegaly Pulses: 2+ and symmetric Skin: Skin color, texture, turgor normal. No rashes or lesions Lymph nodes: Cervical, supraclavicular, and axillary nodes normal.  Lab Results  Component Value Date   HGBA1C 6.3 07/08/2018   HGBA1C 7.8 (H) 02/10/2018   HGBA1C 7.1 (H) 08/05/2017    Lab Results  Component Value Date   CREATININE 0.76 07/08/2018   CREATININE 0.94 03/14/2018   CREATININE 0.74 02/10/2018    Lab Results  Component Value Date   WBC 7.0 04/20/2016   HGB 14.3 04/20/2016   HCT 42.6 04/20/2016   PLT 168.0 04/20/2016   GLUCOSE 143 (H) 07/08/2018   CHOL 92 07/08/2018   TRIG 153.0 (H) 07/08/2018   HDL 23.70 (L) 07/08/2018   LDLDIRECT 82.0 12/22/2016   LDLCALC 38 07/08/2018   ALT 27 07/08/2018   AST 20 07/08/2018   NA 139 07/08/2018   K 4.5 07/08/2018   CL 102 07/08/2018   CREATININE 0.76 07/08/2018   BUN 24 (H) 07/08/2018   CO2 29 07/08/2018   TSH 0.76 09/10/2014   INR 1.0 09/10/2014   HGBA1C 6.3 07/08/2018   MICROALBUR 3.2 (H) 02/10/2018    Dg Bone Density  Result Date: 11/10/2017 EXAM: DUAL X-RAY ABSORPTIOMETRY (DXA) FOR BONE MINERAL DENSITY IMPRESSION: Dear Dr Derrel Nip, Your patient Niko Jakel completed a BMD test on 11/10/2017 using the [place] (analysis version: 14.10) manufactured by [employer]. The following summarizes the results of our evaluation. PATIENT BIOGRAPHICAL: Name: Kimberly, Huff Patient ID: [id number] Birth Date: 1945/08/25 Height: 63.0 in. Gender: Female Exam Date: 11/10/2017 Weight: 204.0 lbs. Indications: Advanced Age, Caucasian, Diabetic, History of Fracture (Adult), Hysterectomy, Kidney Disease, Postmenopausal, Vitamin D Deficiency Fractures: Left  forearm, Right humerus, Toes Treatments: Prilosec ASSESSMENT: The BMD measured at Femur Neck Right is 0.693 g/cm2 with a T-score of -2.5. This patient is considered osteoporotic according to [facility]) criteria. Lumbar spine was not utilized due to advanced degenerative changes. Site Region Measured Measured WHO Young Adult BMD Date       Age      Classification T-score DualFemur Neck Right 11/10/2017 74.2 Osteoporosis -2.5 0.693 g/cm2 DualFemur Total Mean 11/10/2017 74.2 Osteopenia -1.2 0.863 g/cm2 Right Forearm Radius 33% 11/10/2017 74.2 Osteopenia -2.0 0.697 g/cm2 World Health Organization [facility]) criteria for post-menopausal, Caucasian Women: Normal:       T-score at or above -1 SD Osteopenia:   T-score between -1 and -2.5 SD Osteoporosis: T-score at or below -2.5 SD RECOMMENDATIONS: 1. All patients should optimize calcium and vitamin D intake. 2. Consider FDA-approved medical therapies in postmenopausal women and men aged 73 years and older, based on the following: a. A hip or vertebral(clinical or morphometric) fracture b. T-score < -2.5 at the  femoral neck or spine after appropriate evaluation to exclude secondary causes c. Low bone mass (T-score between -1.0 and -2.5 at the femoral neck or spine) and a 10-year probability of a hip fracture > 3% or a 10-year probability of a major osteoporosis-related fracture > 20% based on the [place]-adapted WHO algorithm d. Clinician judgment and/or patient preferences may indicate treatment for people with 10-year fracture probabilities above or below these levels FOLLOW-UP: Patients with diagnosis of osteoporosis or at high risk for fracture should have regular bone mineral density tests. For patients eligible for Medicare, routine testing is allowed once every 2 years. The testing frequency can be increased to one year for patients who have rapidly progressing disease, those who are receiving or discontinuing medical therapy to restore bone mass, or have  additional risk factors. I have reviewed this report, and agree with the above findings. [facility] Electronically Signed   By: Lowella Grip III M.D.   On: 11/10/2017 10:42    Assessment & Plan:   Problem List Items Addressed This Visit    Dyslipidemia associated with type 2 diabetes mellitus ([place])    HDL has continued to drop with loss of control of DM. I have addressed  With recommended use of  a low glycemic index diet and regular exercise a minimum of 5 days per week.   Lab Results  Component Value Date   CHOL 92 07/08/2018   HDL 23.70 (L) 07/08/2018   LDLCALC 38 07/08/2018   LDLDIRECT 82.0 12/22/2016   TRIG 153.0 (H) 07/08/2018   CHOLHDL 4 07/08/2018          NASH (nonalcoholic steatohepatitis)    Confirmed with biopsy 2012,  With stage III fibrosis.  She is up to date on vaccines.  Taking statin for management of hyperlipidemia.  Diabetes and obesity addressed with medications and Low GI diet . LFTS are mildly elevated.  Annual ultrasound has been done in April.    Lab Results  Component Value Date   ALT 27 07/08/2018   AST 20 07/08/2018   ALKPHOS 37 (L) 07/08/2018   BILITOT 0.5 07/08/2018           Type 2 diabetes mellitus, controlled, with renal complications ([place])    Loss of control noted recently on metformin alone. Improved with addition of glipizide before dinner.  Patient is up to date on  annual eye exam  And has early  Microalbuminuria. She is  tolerating an  ACE/ARB for renal protection and hypertension .   Lab Results  Component Value Date   HGBA1C 6.3 07/08/2018   Lab Results  Component Value Date   MICROALBUR 3.2 (H) 02/10/2018             Benign essential HTN    Well controlled for age on current regimen. Renal function stable, no changes today.  Lab Results  Component Value Date   CREATININE 0.76 07/08/2018   Lab Results  Component Value Date   NA 139 07/08/2018   K 4.5 07/08/2018   CL 102 07/08/2018   CO2 29  07/08/2018          Other Visit Diagnoses    Chronic flank pain    -  Primary   Relevant Medications   etodolac (LODINE XL) 400 MG 24 hr tablet   Other Relevant Orders   Urinalysis, Routine w reflex microscopic (Completed)   Urine Culture   Need for 23-polyvalent pneumococcal polysaccharide vaccine       Relevant Orders  Pneumococcal polysaccharide vaccine 23-valent greater than or equal to 2yo subcutaneous/IM (Completed)      I have discontinued [name] "[name]"'s traMADol. I am also having her start on etodolac and Zoster Vaccine Adjuvanted. Additionally, I am having her maintain her aspirin EC, Cinnamon, furosemide, metFORMIN, glucose blood, losartan, omeprazole, potassium chloride, and rosuvastatin.  Meds ordered this encounter  Medications  . etodolac (LODINE XL) 400 MG 24 hr tablet    Sig: Take 1 tablet (400 mg total) by mouth daily. As needed for joint  Pain    Dispense:  30 tablet    Refill:  3  . Zoster Vaccine Adjuvanted [facility]) injection    Sig: Inject 0.5 mLs into the muscle once for 1 dose.    Dispense:  1 each    Refill:  1    Medications Discontinued During This Encounter  Medication Reason  . traMADol (ULTRAM) 50 MG tablet No longer needed (for PRN medications)    Follow-up: Return in about 6 months (around 01/12/2019).   [name], MD

## 2018-07-12 NOTE — Patient Instructions (Addendum)
You are down 7 lbs since your last visit,  And your labs are all excellent!!!!  You can take etodolac XR once daily for joint pain instead of motrin and aleve  You can combine with tramadol,  Or tylenol (not more Than 1000 mg tylenol daily)  Don't take more than one or  two aspirin daily (increases your risk of G bleeding)    Return to see me in 6 months  Your next Prolia injection is due in June

## 2018-07-13 NOTE — Assessment & Plan Note (Signed)
HDL has continued to drop with loss of control of DM. I have addressed  With recommended use of  a low glycemic index diet and regular exercise a minimum of 5 days per week.   Lab Results  Component Value Date   CHOL 92 07/08/2018   HDL 23.70 (L) 07/08/2018   LDLCALC 38 07/08/2018   LDLDIRECT 82.0 12/22/2016   TRIG 153.0 (H) 07/08/2018   CHOLHDL 4 07/08/2018

## 2018-07-13 NOTE — Assessment & Plan Note (Signed)
Well controlled for age on current regimen. Renal function stable, no changes today.  Lab Results  Component Value Date   CREATININE 0.76 07/08/2018   Lab Results  Component Value Date   NA 139 07/08/2018   K 4.5 07/08/2018   CL 102 07/08/2018   CO2 29 07/08/2018

## 2018-07-13 NOTE — Assessment & Plan Note (Signed)
Confirmed with biopsy 2012,  With stage III fibrosis.  She is up to date on vaccines.  Taking statin for management of hyperlipidemia.  Diabetes and obesity addressed with medications and Low GI diet . LFTS are mildly elevated.  Annual ultrasound has been done in April.    Lab Results  Component Value Date   ALT 27 07/08/2018   AST 20 07/08/2018   ALKPHOS 37 (L) 07/08/2018   BILITOT 0.5 07/08/2018

## 2018-07-13 NOTE — Assessment & Plan Note (Signed)
Loss of control noted recently on metformin alone. Improved with addition of glipizide before dinner.  Patient is up to date on  annual eye exam  And has early  Microalbuminuria. She is  tolerating an  ACE/ARB for renal protection and hypertension .   Lab Results  Component Value Date   HGBA1C 6.3 07/08/2018   Lab Results  Component Value Date   MICROALBUR 3.2 (H) 02/10/2018

## 2018-07-14 ENCOUNTER — Other Ambulatory Visit: Payer: Self-pay | Admitting: Internal Medicine

## 2018-07-14 LAB — URINE CULTURE
MICRO NUMBER:: 299892
SPECIMEN QUALITY:: ADEQUATE

## 2018-07-14 MED ORDER — AMOXICILLIN-POT CLAVULANATE 875-125 MG PO TABS: 1.0000 | ORAL_TABLET | Freq: Two times a day (BID) | ORAL | 0 refills | Status: DC

## 2018-07-15 ENCOUNTER — Telehealth: Payer: Self-pay | Admitting: Internal Medicine

## 2018-07-15 MED ORDER — ETODOLAC 400 MG PO TABS: 400.0000 mg | ORAL_TABLET | Freq: Two times a day (BID) | ORAL | 4 refills | Status: DC

## 2018-07-15 NOTE — Telephone Encounter (Signed)
Replacement rx  Etodolac 400 mg bid rx sent to [facility]

## 2018-07-15 NOTE — Telephone Encounter (Signed)
Copied from [place] [id number]. Topic: Quick Communication - See Telephone Encounter >> Jul 15, 2018  2:14 PM Sheran Luz wrote: CRM for notification. See Telephone encounter for: 07/15/18.  Kimberly Huff, with [facility] pharmacy, states that pharmacy does not have etodolac (LODINE XL) 400 MG 24 hr tablet available in "ER" form. She states that they have it "plain" 400MG  and inquired if that would be acceptable replacement with prescriber. Please advise.    [facility] # [phone]

## 2018-08-27 ENCOUNTER — Other Ambulatory Visit: Payer: Self-pay | Admitting: Family Medicine

## 2018-09-27 ENCOUNTER — Other Ambulatory Visit: Payer: Self-pay | Admitting: Internal Medicine

## 2018-10-01 ENCOUNTER — Other Ambulatory Visit: Payer: Self-pay | Admitting: Internal Medicine

## 2018-10-03 DIAGNOSIS — K219 Gastro-esophageal reflux disease without esophagitis: Secondary | ICD-10-CM | POA: Diagnosis not present

## 2018-10-03 DIAGNOSIS — R011 Cardiac murmur, unspecified: Secondary | ICD-10-CM | POA: Diagnosis not present

## 2018-10-03 DIAGNOSIS — Z6834 Body mass index (BMI) 34.0-34.9, adult: Secondary | ICD-10-CM | POA: Diagnosis not present

## 2018-10-03 DIAGNOSIS — N182 Chronic kidney disease, stage 2 (mild): Secondary | ICD-10-CM | POA: Diagnosis not present

## 2018-10-03 DIAGNOSIS — I10 Essential (primary) hypertension: Secondary | ICD-10-CM | POA: Diagnosis not present

## 2018-10-03 DIAGNOSIS — E6609 Other obesity due to excess calories: Secondary | ICD-10-CM | POA: Diagnosis not present

## 2018-10-03 DIAGNOSIS — I25119 Atherosclerotic heart disease of native coronary artery with unspecified angina pectoris: Secondary | ICD-10-CM | POA: Diagnosis not present

## 2018-10-03 DIAGNOSIS — Q600 Renal agenesis, unilateral: Secondary | ICD-10-CM | POA: Diagnosis not present

## 2018-10-03 DIAGNOSIS — K7581 Nonalcoholic steatohepatitis (NASH): Secondary | ICD-10-CM | POA: Diagnosis not present

## 2018-10-03 DIAGNOSIS — E119 Type 2 diabetes mellitus without complications: Secondary | ICD-10-CM | POA: Diagnosis not present

## 2018-10-03 DIAGNOSIS — E669 Obesity, unspecified: Secondary | ICD-10-CM | POA: Diagnosis not present

## 2018-10-03 DIAGNOSIS — E7849 Other hyperlipidemia: Secondary | ICD-10-CM | POA: Diagnosis not present

## 2018-10-12 ENCOUNTER — Other Ambulatory Visit: Payer: Self-pay | Admitting: Internal Medicine

## 2018-10-14 ENCOUNTER — Other Ambulatory Visit: Payer: Self-pay | Admitting: Internal Medicine

## 2018-10-20 ENCOUNTER — Other Ambulatory Visit: Payer: Self-pay | Admitting: Internal Medicine

## 2018-10-20 DIAGNOSIS — Z1231 Encounter for screening mammogram for malignant neoplasm of breast: Secondary | ICD-10-CM

## 2018-11-17 ENCOUNTER — Telehealth: Payer: Self-pay | Admitting: *Deleted

## 2018-11-17 NOTE — Telephone Encounter (Signed)
Pharmacist, community for [facility] [address].

## 2018-11-17 NOTE — Telephone Encounter (Signed)
Pt called and wanted to know if she was due for her prolia shot and wanted to get it scheduled

## 2018-11-17 NOTE — Telephone Encounter (Signed)
-----   Message from [name], MD sent at 07/12/2018  1:42 PM EDT ----- Due for prolia in June

## 2018-11-18 NOTE — Telephone Encounter (Signed)
Patient scheduled for Prolia 11/21/18

## 2018-11-21 ENCOUNTER — Ambulatory Visit (INDEPENDENT_AMBULATORY_CARE_PROVIDER_SITE_OTHER): Payer: PPO | Admitting: *Deleted

## 2018-11-21 ENCOUNTER — Other Ambulatory Visit: Payer: Self-pay

## 2018-11-21 DIAGNOSIS — M810 Age-related osteoporosis without current pathological fracture: Secondary | ICD-10-CM

## 2018-11-21 MED ORDER — DENOSUMAB 60 MG/ML ~~LOC~~ SOSY
60.0000 mg | PREFILLED_SYRINGE | Freq: Once | SUBCUTANEOUS | Status: AC
  Administered 2018-11-21: 60 mg via SUBCUTANEOUS

## 2018-11-21 NOTE — Progress Notes (Signed)
Patient presented for Prolia injection toxleft arm [place], patient voiced no concerns or complaints during or after injection.

## 2018-11-23 NOTE — Telephone Encounter (Signed)
Patient has had her Prolia injection.

## 2018-11-28 NOTE — Telephone Encounter (Signed)
Called and spoke to patient.  Incident w/ her friend happened on last Saturday.  Pt said her friend came up to her and hugged her.  Friend was not wearing a mask.  Pt said that her friend did travel to [place] with her family and now pt's friend's father has tested positive for COVID.  Pt said friend has no symptoms at this time. Pt has no symptoms at this time.  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  Pt said that 2 of the 3 ladies have been tested for COVID and test results were negative.  Pt wants to know if Dr. Derrel Nip would recommend that she get COVID testing.  Informed pt that Dr. Derrel Nip is out-of-the-office today but will forward notes for her review.  Reviewed COVID symptoms with pt.  Advised pt to call back office if she starts having any symptoms for virtual visit to be scheduled.

## 2018-11-30 ENCOUNTER — Other Ambulatory Visit: Payer: Self-pay

## 2018-11-30 ENCOUNTER — Ambulatory Visit
Admission: RE | Admit: 2018-11-30 | Discharge: 2018-11-30 | Disposition: A | Discharge status: Home / Self Care | Payer: PPO | Source: Ambulatory Visit | Attending: Internal Medicine | Admitting: Internal Medicine

## 2018-11-30 DIAGNOSIS — Z1231 Encounter for screening mammogram for malignant neoplasm of breast: Secondary | ICD-10-CM | POA: Insufficient documentation

## 2018-12-19 ENCOUNTER — Other Ambulatory Visit: Payer: Self-pay | Admitting: Internal Medicine

## 2018-12-19 MED ORDER — IBUPROFEN 800 MG PO TABS: 800.0000 mg | ORAL_TABLET | Freq: Every day | ORAL | 1 refills | Status: DC | PRN

## 2018-12-21 ENCOUNTER — Other Ambulatory Visit: Payer: Self-pay

## 2018-12-22 ENCOUNTER — Encounter: Payer: Self-pay | Admitting: Internal Medicine

## 2018-12-22 ENCOUNTER — Ambulatory Visit (INDEPENDENT_AMBULATORY_CARE_PROVIDER_SITE_OTHER): Payer: PPO | Admitting: Internal Medicine

## 2018-12-22 VITALS — BP 114/64 | HR 75 | Temp 97.4°F | Resp 16 | Ht 62.25 in | Wt 190.6 lb

## 2018-12-22 DIAGNOSIS — B952 Enterococcus as the cause of diseases classified elsewhere: Secondary | ICD-10-CM | POA: Diagnosis not present

## 2018-12-22 DIAGNOSIS — R300 Dysuria: Secondary | ICD-10-CM

## 2018-12-22 DIAGNOSIS — M546 Pain in thoracic spine: Secondary | ICD-10-CM

## 2018-12-22 DIAGNOSIS — E1169 Type 2 diabetes mellitus with other specified complication: Secondary | ICD-10-CM | POA: Diagnosis not present

## 2018-12-22 DIAGNOSIS — G8929 Other chronic pain: Secondary | ICD-10-CM

## 2018-12-22 DIAGNOSIS — N390 Urinary tract infection, site not specified: Secondary | ICD-10-CM

## 2018-12-22 DIAGNOSIS — E785 Hyperlipidemia, unspecified: Secondary | ICD-10-CM | POA: Diagnosis not present

## 2018-12-22 DIAGNOSIS — M545 Low back pain, unspecified: Secondary | ICD-10-CM

## 2018-12-22 DIAGNOSIS — E1121 Type 2 diabetes mellitus with diabetic nephropathy: Secondary | ICD-10-CM | POA: Diagnosis not present

## 2018-12-22 DIAGNOSIS — K7581 Nonalcoholic steatohepatitis (NASH): Secondary | ICD-10-CM

## 2018-12-22 LAB — POCT URINALYSIS DIPSTICK
Bilirubin, UA: NEGATIVE
Blood, UA: NEGATIVE
Glucose, UA: NEGATIVE
Ketones, UA: NEGATIVE
Leukocytes, UA: NEGATIVE
Nitrite, UA: NEGATIVE
Protein, UA: NEGATIVE
Spec Grav, UA: 1.02 (ref 1.010–1.025)
Urobilinogen, UA: 0.2 E.U./dL
pH, UA: 6 (ref 5.0–8.0)

## 2018-12-22 LAB — URINALYSIS, MICROSCOPIC ONLY: RBC / HPF: NONE SEEN (ref 0–?)

## 2018-12-22 MED ORDER — TIZANIDINE HCL 4 MG PO TABS: 4.0000 mg | ORAL_TABLET | Freq: Four times a day (QID) | ORAL | 0 refills | Status: DC | PRN

## 2018-12-22 NOTE — Progress Notes (Signed)
Subjective:  Patient ID: Kimberly Huff, female    DOB: [birth year]  Age: 73 y.o. MRN: [id number]  CC: The primary encounter diagnosis was Dysuria. Diagnoses of Controlled type 2 diabetes mellitus with diabetic nephropathy, without long-term current use of insulin ([place]), NASH (nonalcoholic steatohepatitis), Dyslipidemia associated with type 2 diabetes mellitus ([place]), Acute midline thoracic back pain, and Acute exacerbation of chronic low back pain were also pertinent to this visit.  HPI Kimberly Huff presents for evaluation of back pain . Low back pain and mid back pain for the past week along with dysuriaa,  And a feeling of incomplete bladder amptying   Upper back pain   Working at [facility]  No heavy lifitng  But bending and stooping a lot , back spasms frequently   Outpatient Medications Prior to Visit  Medication Sig Dispense Refill  . aspirin EC 325 MG tablet Take 325 mg by mouth daily.    . Cinnamon 500 MG capsule Take by mouth.    . furosemide (LASIX) 40 MG tablet TAKE ONE TABLET EVERY DAY 90 tablet 1  . glucose blood (ONE TOUCH ULTRA TEST) test strip Use as instructed 100 each 12  . ibuprofen (ADVIL) 800 MG tablet Take 1 tablet (800 mg total) by mouth daily as needed. 90 tablet 1  . losartan (COZAAR) 50 MG tablet TAKE ONE TABLET BY MOUTH EVERY DAY 30 tablet 5  . metFORMIN (GLUCOPHAGE) 850 MG tablet TAKE ONE TABLET BY MOUTH TWICE DAILY WITH A MEAL 180 tablet 1  . omeprazole (PRILOSEC) 40 MG capsule TAKE 1 CAPSULE EVERY DAY 30 capsule 4  . potassium chloride (K-DUR) 10 MEQ tablet TAKE ONE TABLET EVERY DAY 90 tablet 1  . rosuvastatin (CRESTOR) 10 MG tablet TAKE ONE TABLET BY MOUTH EVERY DAY 90 tablet 1  . amoxicillin-clavulanate (AUGMENTIN) 875-125 MG tablet Take 1 tablet by mouth 2 (two) times daily. (Patient not taking: Reported on 12/22/2018) 14 tablet 0   No facility-administered medications prior to visit.     Review of Systems;  Patient denies headache, fevers,  malaise, unintentional weight loss, skin rash, eye pain, sinus congestion and sinus pain, sore throat, dysphagia,  hemoptysis , cough, dyspnea, wheezing, chest pain, palpitations, orthopnea, edema, abdominal pain, nausea, melena, diarrhea, constipation, flank pain, dysuria, hematuria, urinary  Frequency, nocturia, numbness, tingling, seizures,  Focal weakness, Loss of consciousness,  Tremor, insomnia, depression, anxiety, and suicidal ideation.      Objective:  BP 114/64 (BP Location: Left Arm, Patient Position: Sitting, Cuff Size: Large)   Pulse 75   Temp (!) 97.4 F (36.3 C) (Oral)   Resp 16   Ht 5' 2.25" (1.581 m)   Wt 190 lb 9.6 oz (86.5 kg)   SpO2 99%   BMI 34.58 kg/m   BP Readings from Last 3 Encounters:  12/22/18 114/64  07/12/18 112/70  06/10/18 122/62    Wt Readings from Last 3 Encounters:  12/22/18 190 lb 9.6 oz (86.5 kg)  07/12/18 188 lb (85.3 kg)  06/10/18 188 lb 6.4 oz (85.5 kg)    General appearance: alert, cooperative and appears stated age Ears: normal TM's and external ear canals both ears Throat: lips, mucosa, and tongue normal; teeth and gums normal Neck: no adenopathy, no carotid bruit, supple, symmetrical, trachea midline and thyroid not enlarged, symmetric, no tenderness/mass/nodules Back: symmetric, no curvature. ROM normal. No CVA or spinal tenderness. Lungs: clear to auscultation bilaterally Heart: regular rate and rhythm, S1, S2 normal, no murmur, click, rub or  gallop Abdomen: soft, non-tender; bowel sounds normal; no masses,  no organomegaly Pulses: 2+ and symmetric Skin: Skin color, texture, turgor normal. No rashes or lesions Lymph nodes: Cervical, supraclavicular, and axillary nodes normal.  Lab Results  Component Value Date   HGBA1C 7.0 (H) 12/23/2018   HGBA1C 6.3 07/08/2018   HGBA1C 7.8 (H) 02/10/2018    Lab Results  Component Value Date   CREATININE 0.85 12/23/2018   CREATININE 0.76 07/08/2018   CREATININE 0.94 03/14/2018    Lab  Results  Component Value Date   WBC 7.0 04/20/2016   HGB 14.3 04/20/2016   HCT 42.6 04/20/2016   PLT 168.0 04/20/2016   GLUCOSE 97 12/23/2018   CHOL 92 07/08/2018   TRIG 153.0 (H) 07/08/2018   HDL 23.70 (L) 07/08/2018   LDLDIRECT 82.0 12/22/2016   LDLCALC 38 07/08/2018   ALT 30 (H) 12/23/2018   AST 30 12/23/2018   NA 141 12/23/2018   K 5.0 12/23/2018   CL 106 12/23/2018   CREATININE 0.85 12/23/2018   BUN 20 12/23/2018   CO2 25 12/23/2018   TSH 0.76 09/10/2014   INR 1.0 09/10/2014   HGBA1C 7.0 (H) 12/23/2018   MICROALBUR 3.2 (H) 02/10/2018    Mm 3d Screen Breast Bilateral  Result Date: 11/30/2018 CLINICAL DATA:  Screening. EXAM: DIGITAL SCREENING BILATERAL MAMMOGRAM WITH TOMO AND CAD COMPARISON:  Previous exam(s). ACR Breast Density Category c: The breast tissue is heterogeneously dense, which may obscure small masses. FINDINGS: There are no findings suspicious for malignancy. Images were processed with CAD. IMPRESSION: No mammographic evidence of malignancy. A result letter of this screening mammogram will be mailed directly to the patient. RECOMMENDATION: Screening mammogram in one year. (Code:SM-B-01Y) BI-RADS CATEGORY  1: Negative. Electronically Signed   By: Fidela Salisbury M.D.   On: 11/30/2018 09:59    Assessment & Plan:   Problem List Items Addressed This Visit      Unprioritized   Acute exacerbation of chronic low back pain    No evidence of UTI by today's ua.   Plain films ordered  Of lumbar spine .  Medical management including PT and analgesics       Relevant Medications   tiZANidine (ZANAFLEX) 4 MG tablet   Dyslipidemia associated with type 2 diabetes mellitus ([facility])   NASH (nonalcoholic steatohepatitis)   Relevant Orders   Hepatic function panel   Comprehensive metabolic panel (Completed)   Type 2 diabetes mellitus, controlled, with renal complications ([place])   Relevant Orders   Basic metabolic panel   Hemoglobin A1c (Completed)    Other Visit  Diagnoses    Dysuria    -  Primary   Relevant Orders   POCT Urinalysis Dipstick (Completed)   Urine Culture (Completed)   Urine Microscopic Only (Completed)   Acute midline thoracic back pain       Relevant Medications   tiZANidine (ZANAFLEX) 4 MG tablet   Other Relevant Orders   DG Thoracic Spine W/Swimmers (Completed)      I have discontinued Kimberly Crazier "[name]"'s amoxicillin-clavulanate. I am also having her start on tiZANidine. Additionally, I am having her maintain her aspirin EC, Cinnamon, glucose blood, potassium chloride, omeprazole, furosemide, metFORMIN, losartan, rosuvastatin, and ibuprofen.  Meds ordered this encounter  Medications  . tiZANidine (ZANAFLEX) 4 MG tablet    Sig: Take 1 tablet (4 mg total) by mouth every 6 (six) hours as needed for muscle spasms.    Dispense:  30 tablet    Refill:  0  Medications Discontinued During This Encounter  Medication Reason  . amoxicillin-clavulanate (AUGMENTIN) 875-125 MG tablet Patient has not taken in last 30 days    Follow-up: No follow-ups on file.   [name], MD

## 2018-12-22 NOTE — Patient Instructions (Signed)
There is no evidence of UTI   BY the preliminary test ,  But I will know more in 2 days with the culture results.  Your back pain is likely from muscle spasm caused by activities that change your  posture and have you using muscles you haven't used in a while   I will prescribe a muscle relaxer tizanidine to take when you are HOME  INCREASE THE TRAMADOOL TO 100 MG EVERY 8 HOURS IF IT HELPS  NO MORE THAN 400 MG IBUPROFEN EVERY 12 HOURS  X RAYS TODAY OF THORACIC SPINE   I

## 2018-12-23 ENCOUNTER — Other Ambulatory Visit: Payer: Self-pay

## 2018-12-23 ENCOUNTER — Other Ambulatory Visit (INDEPENDENT_AMBULATORY_CARE_PROVIDER_SITE_OTHER): Payer: PPO

## 2018-12-23 ENCOUNTER — Ambulatory Visit: Payer: PPO | Admitting: Internal Medicine

## 2018-12-23 ENCOUNTER — Ambulatory Visit (INDEPENDENT_AMBULATORY_CARE_PROVIDER_SITE_OTHER)
Admission: RE | Admit: 2018-12-23 | Discharge: 2018-12-23 | Disposition: A | Discharge status: Home / Self Care | Payer: PPO | Source: Ambulatory Visit | Attending: Internal Medicine | Admitting: Internal Medicine

## 2018-12-23 DIAGNOSIS — M47814 Spondylosis without myelopathy or radiculopathy, thoracic region: Secondary | ICD-10-CM | POA: Diagnosis not present

## 2018-12-23 DIAGNOSIS — M546 Pain in thoracic spine: Secondary | ICD-10-CM | POA: Diagnosis not present

## 2018-12-23 DIAGNOSIS — E1121 Type 2 diabetes mellitus with diabetic nephropathy: Secondary | ICD-10-CM | POA: Diagnosis not present

## 2018-12-23 DIAGNOSIS — K7581 Nonalcoholic steatohepatitis (NASH): Secondary | ICD-10-CM

## 2018-12-24 DIAGNOSIS — B952 Enterococcus as the cause of diseases classified elsewhere: Secondary | ICD-10-CM | POA: Insufficient documentation

## 2018-12-24 DIAGNOSIS — M545 Low back pain, unspecified: Secondary | ICD-10-CM | POA: Insufficient documentation

## 2018-12-24 DIAGNOSIS — N390 Urinary tract infection, site not specified: Secondary | ICD-10-CM | POA: Insufficient documentation

## 2018-12-24 DIAGNOSIS — G8929 Other chronic pain: Secondary | ICD-10-CM | POA: Insufficient documentation

## 2018-12-24 LAB — COMPREHENSIVE METABOLIC PANEL
AG Ratio: 1.9 (calc) (ref 1.0–2.5)
ALT: 30 U/L — ABNORMAL HIGH (ref 6–29)
AST: 30 U/L (ref 10–35)
Albumin: 4.3 g/dL (ref 3.6–5.1)
Alkaline phosphatase (APISO): 44 U/L (ref 37–153)
BUN: 20 mg/dL (ref 7–25)
CO2: 25 mmol/L (ref 20–32)
Calcium: 9.6 mg/dL (ref 8.6–10.4)
Chloride: 106 mmol/L (ref 98–110)
Creat: 0.85 mg/dL (ref 0.60–0.93)
Globulin: 2.3 g/dL (calc) (ref 1.9–3.7)
Glucose, Bld: 97 mg/dL (ref 65–99)
Potassium: 5 mmol/L (ref 3.5–5.3)
Sodium: 141 mmol/L (ref 135–146)
Total Bilirubin: 0.6 mg/dL (ref 0.2–1.2)
Total Protein: 6.6 g/dL (ref 6.1–8.1)

## 2018-12-24 LAB — URINE CULTURE
MICRO NUMBER:: 793261
SPECIMEN QUALITY:: ADEQUATE

## 2018-12-24 LAB — HEMOGLOBIN A1C
Hgb A1c MFr Bld: 7 % of total Hgb — ABNORMAL HIGH (ref <5.7)
Mean Plasma Glucose: 154 (calc)
eAG (mmol/L): 8.5 (calc)

## 2018-12-24 MED ORDER — AMOXICILLIN-POT CLAVULANATE 875-125 MG PO TABS: 1.0000 | ORAL_TABLET | Freq: Two times a day (BID) | ORAL | 0 refills | Status: DC

## 2018-12-24 NOTE — Assessment & Plan Note (Addendum)
Equivocal evidence of UTI by today's ua (normal UA,  But 50-100K colonies of ESBL).  Treating given syptoms of dysuria and frequency. Plain films ordered  Of thoracolumbar spine .  Medical management including PT and analgesics

## 2018-12-24 NOTE — Assessment & Plan Note (Signed)
Treating ESBL with augmentin ,  probiotic advised

## 2018-12-26 ENCOUNTER — Other Ambulatory Visit: Payer: Self-pay | Admitting: Internal Medicine

## 2018-12-26 MED ORDER — HYDROCODONE-ACETAMINOPHEN 5-325 MG PO TABS: 1.0000 | ORAL_TABLET | Freq: Four times a day (QID) | ORAL | 0 refills | Status: DC | PRN

## 2018-12-26 NOTE — Progress Notes (Signed)
vicodin

## 2018-12-28 ENCOUNTER — Other Ambulatory Visit: Payer: Self-pay | Admitting: Internal Medicine

## 2018-12-28 ENCOUNTER — Encounter: Payer: Self-pay | Admitting: Internal Medicine

## 2018-12-28 ENCOUNTER — Other Ambulatory Visit: Payer: Self-pay

## 2018-12-28 ENCOUNTER — Ambulatory Visit (INDEPENDENT_AMBULATORY_CARE_PROVIDER_SITE_OTHER): Payer: PPO | Admitting: Internal Medicine

## 2018-12-28 DIAGNOSIS — M546 Pain in thoracic spine: Secondary | ICD-10-CM | POA: Diagnosis not present

## 2018-12-28 MED ORDER — PREDNISONE 10 MG PO TABS: ORAL_TABLET | ORAL | 0 refills | Status: DC

## 2018-12-28 NOTE — Progress Notes (Addendum)
Telephone Note   This visit type was conducted due to national recommendations for restrictions regarding the COVID-19 pandemic (e.g. social distancing).  This format is felt to be most appropriate for this patient at this time.  All issues noted in this document were discussed and addressed.  No physical exam was performed (except for noted visual exam findings with Video Visits).   I connected with@ on 12/28/18 at  4:30 PM EDT by telephone and verified that I am speaking with the correct person using two identifiers. Location patient: home Location provider: work or home office Persons participating in the virtual visit: patient, provider  I discussed the limitations, risks, security and privacy concerns of performing an evaluation and management service by telephone and the availability of in person appointments. I also discussed with the patient that there may be a patient responsible charge related to this service. The patient expressed understanding and agreed to proceed.  Reason for visit: follow up   HPI:  73 yr old female presents for follow up on back pain in the mid thoracic area.  UTI ruled out last week.  Thoracic spine films were done and reviewed with patient today.  Degenerative changes noted throughout the spine.  She has being having severe and persistent pain at  times too severe to get out of bed despite maximal doses of NSAIDs and muscle relaxers.  The pain does not radiate .  Vicodin was added two days  ago but she has not used it.   She has no history of fall or unusual activity.   Review of Systems:   Patient denies headache, fevers, malaise, unintentional weight loss, skin rash, eye pain, sinus congestion and sinus pain, sore throat, dysphagia,  hemoptysis , cough, dyspnea, wheezing, chest pain, palpitations, orthopnea, edema, abdominal pain, nausea, melena, diarrhea, constipation, flank pain, dysuria, hematuria, urinary  Frequency, nocturia, numbness, tingling,  seizures,  Focal weakness, Loss of consciousness,  Tremor, insomnia, depression, anxiety, and suicidal ideation.      Past Medical History:  Diagnosis Date  . Allergy   . Atrophic kidney    left  . Blood transfusion   . Blood transfusion without reported diagnosis   . Cataract   . Clotting disorder (HCC)    blood clots in abdomen after surgery  . Coronary artery disease   . Coronary atherosclerosis   . Diabetes mellitus   . Diverticulosis 2005   Dr Jamal Collin  . External hemorrhoids without mention of complication 1017  . GERD (gastroesophageal reflux disease)   . H/O cardiac catheterization 2005  . Heart murmur 2012   found by Dr. Jamal Collin  . Hyperlipidemia   . Hypertension   . Hypopotassemia   . Kidney stone   . Myocardial infarction ([place]) 2000  . Neuromuscular disorder ([place])    "achy muscles"  . Obesity   . Osteoarthritis   . Recurrent UTI   . Spinal stenosis of cervical region   . Steatohepatitis 05/30/10   Brunt Stage 3, non alcoholic cirrhosis of the liver  . Type II or unspecified type diabetes mellitus without mention of complication, uncontrolled    Patient does takes Metformin and recommended to stop for 48 hours.  Marland Kitchen Ulcer   . Unspecified essential hypertension 2014    Past Surgical History:  Procedure Laterality Date  . ABDOMINAL HYSTERECTOMY  1982   complete  . BREAST CYST ASPIRATION Right 1990's   benign  . BREAST MASS EXCISION     benign  . CARDIAC CATHETERIZATION  2000   cardiac stent  . cardiac stents  2000  . CATARACT EXTRACTION     both eyes  . COLONOSCOPY  2004, 2015   Dr. Jamal Collin, Dr. Olevia Perches  . CYSTOSCOPY W/ RETROGRADES Left 11/12/2015   Procedure: CYSTOSCOPY WITH RETROGRADE PYELOGRAM;  Surgeon: Cleon Gustin, MD;  Location: [facility];  Service: Urology;  Laterality: Left;  . CYSTOSCOPY W/ URETERAL STENT PLACEMENT Left 11/12/2015   Procedure: CYSTOSCOPY WITH STENT REPLACEMENT;  Surgeon: Cleon Gustin, MD;  Location: [facility];  Service:  Urology;  Laterality: Left;  . CYSTOSCOPY WITH URETEROSCOPY AND STENT PLACEMENT Left 10/15/2015   Procedure: CYSTOSCOPY WITH URETEROSCOPY AND STENT PLACEMENT;  Surgeon: Cleon Gustin, MD;  Location: [facility];  Service: Urology;  Laterality: Left;  . EYE SURGERY Bilateral 2012   cataract  . FOOT SURGERY  2008  . PTCA  2000  . SALPINGOOPHORECTOMY    . TONSILLECTOMY    . UPPER GI ENDOSCOPY    . URETEROSCOPY WITH HOLMIUM LASER LITHOTRIPSY Left 11/12/2015   Procedure: URETEROSCOPY WITH HOLMIUM LASER LITHOTRIPSY;  Surgeon: Cleon Gustin, MD;  Location: [facility];  Service: Urology;  Laterality: Left;  . URETEROTOMY  1960/1969   x 2 during childhood  . VAGINAL HYSTERECTOMY     PT DENIES ABD HYSTERECTOMY    Family History  Problem Relation Age of Onset  . Lung cancer Father   . Heart disease Father   . Diverticulosis Mother   . Diabetes Mother   . Stroke Mother 60  . Autoimmune disease Daughter   . Thyroid disease Daughter   . Thyroid disease Sister   . Stomach cancer Maternal Aunt   . Breast cancer Maternal Aunt   . Colon cancer Neg Hx   . Esophageal cancer Neg Hx   . Rectal cancer Neg Hx     SOCIAL HX:  reports that she quit smoking about 20 years ago. Her smoking use included cigarettes. She has never used smokeless tobacco. She reports current alcohol use. She reports that she does not use drugs.   Current Outpatient Medications:  .  amoxicillin-clavulanate (AUGMENTIN) 875-125 MG tablet, Take 1 tablet by mouth 2 (two) times daily., Disp: 14 tablet, Rfl: 0 .  aspirin EC 325 MG tablet, Take 325 mg by mouth daily., Disp: , Rfl:  .  Cinnamon 500 MG capsule, Take by mouth., Disp: , Rfl:  .  furosemide (LASIX) 40 MG tablet, TAKE ONE TABLET EVERY DAY, Disp: 90 tablet, Rfl: 1 .  glucose blood (ONE TOUCH ULTRA TEST) test strip, Use as instructed, Disp: 100 each, Rfl: 12 .  HYDROcodone-acetaminophen (NORCO/VICODIN) 5-325 MG tablet, Take 1 tablet by mouth every 6 (six) hours as  needed for moderate pain., Disp: 30 tablet, Rfl: 0 .  ibuprofen (ADVIL) 800 MG tablet, Take 1 tablet (800 mg total) by mouth daily as needed., Disp: 90 tablet, Rfl: 1 .  losartan (COZAAR) 50 MG tablet, TAKE ONE TABLET BY MOUTH EVERY DAY, Disp: 30 tablet, Rfl: 5 .  metFORMIN (GLUCOPHAGE) 850 MG tablet, TAKE ONE TABLET BY MOUTH TWICE DAILY WITH A MEAL, Disp: 180 tablet, Rfl: 1 .  omeprazole (PRILOSEC) 40 MG capsule, TAKE 1 CAPSULE BY MOUTH EVERY DAY, Disp: 90 capsule, Rfl: 1 .  potassium chloride (K-DUR) 10 MEQ tablet, TAKE ONE TABLET EVERY DAY, Disp: 90 tablet, Rfl: 1 .  rosuvastatin (CRESTOR) 10 MG tablet, TAKE ONE TABLET BY MOUTH EVERY DAY, Disp: 90 tablet, Rfl: 1 .  tiZANidine (ZANAFLEX) 4 MG tablet,  Take 1 tablet (4 mg total) by mouth every 6 (six) hours as needed for muscle spasms., Disp: 30 tablet, Rfl: 0 .  predniSONE (DELTASONE) 10 MG tablet, 6 tablets daily for 3 days , then reduce by 1 tablet daily until gone, Disp: 33 tablet, Rfl: 0  EXAM:  General impression: alert, cooperative and articulate.  No signs of being in distress  Lungs: speech is fluent sentence length suggests that patient is not short of breath and not punctuated by cough, sneezing or sniffing. Marland Kitchen   Psych: affect normal.  speech is articulate and non pressured .  Denies suicidal thoughts   ASSESSMENT AND PLAN:  Acute left-sided thoracic back pain Secondary to degenerative changes and muscle spasm, based on plain films and history.  Stop NSs,  AIDS start prednisone taper,  Continue MR and prn vicodin .  PT needed once recovered     I discussed the assessment and treatment plan with the patient. The patient was provided an opportunity to ask questions and all were answered. The patient agreed with the plan and demonstrated an understanding of the instructions.   The patient was advised to call back or seek an in-person evaluation if the symptoms worsen or if the condition fails to improve as anticipated.  I provided 22  minutes of non-face-to-face time during this encounter.   [name], MD

## 2018-12-30 ENCOUNTER — Encounter: Payer: Self-pay | Admitting: Internal Medicine

## 2018-12-30 DIAGNOSIS — M546 Pain in thoracic spine: Secondary | ICD-10-CM | POA: Insufficient documentation

## 2018-12-30 NOTE — Assessment & Plan Note (Signed)
Secondary to degenerative changes and muscle spasm, based on plain films and history.  Stop NSs,  AIDS start prednisone taper,  Continue MR and prn vicodin .  PT needed once recovered

## 2018-12-31 ENCOUNTER — Other Ambulatory Visit: Payer: Self-pay | Admitting: Internal Medicine

## 2018-12-31 NOTE — Addendum Note (Signed)
Addended by: [name] on: 12/31/2018 12:13 AM   Modules accepted: Level of Service

## 2019-02-04 ENCOUNTER — Other Ambulatory Visit: Payer: Self-pay | Admitting: Internal Medicine

## 2019-02-04 MED ORDER — PREDNISONE 10 MG PO TABS: ORAL_TABLET | ORAL | 0 refills | Status: DC

## 2019-02-13 ENCOUNTER — Other Ambulatory Visit: Payer: Self-pay | Admitting: Internal Medicine

## 2019-02-22 ENCOUNTER — Encounter: Payer: Self-pay | Admitting: Family Medicine

## 2019-02-22 ENCOUNTER — Other Ambulatory Visit: Payer: Self-pay

## 2019-02-22 ENCOUNTER — Ambulatory Visit (INDEPENDENT_AMBULATORY_CARE_PROVIDER_SITE_OTHER): Payer: PPO | Admitting: Family Medicine

## 2019-02-22 ENCOUNTER — Telehealth: Payer: Self-pay | Admitting: Internal Medicine

## 2019-02-22 VITALS — BP 140/72 | HR 77 | Temp 98.8°F | Ht 62.25 in | Wt 189.2 lb

## 2019-02-22 DIAGNOSIS — N390 Urinary tract infection, site not specified: Secondary | ICD-10-CM

## 2019-02-22 DIAGNOSIS — R300 Dysuria: Secondary | ICD-10-CM | POA: Diagnosis not present

## 2019-02-22 DIAGNOSIS — R319 Hematuria, unspecified: Secondary | ICD-10-CM | POA: Diagnosis not present

## 2019-02-22 LAB — POCT URINALYSIS DIPSTICK
Blood, UA: NEGATIVE
Glucose, UA: NEGATIVE
Ketones, UA: POSITIVE
Nitrite, UA: POSITIVE
Protein, UA: POSITIVE — AB
Spec Grav, UA: 1.025 (ref 1.010–1.025)
Urobilinogen, UA: 1 E.U./dL
pH, UA: 5 (ref 5.0–8.0)

## 2019-02-22 MED ORDER — CEFDINIR 300 MG PO CAPS: 300.0000 mg | ORAL_CAPSULE | Freq: Two times a day (BID) | ORAL | 0 refills | Status: DC

## 2019-02-22 NOTE — Patient Instructions (Signed)
Urinary Tract Infection, Adult A urinary tract infection (UTI) is an infection of any part of the urinary tract. The urinary tract includes:  The kidneys.  The ureters.  The bladder.  The urethra. These organs make, store, and get rid of pee (urine) in the body. What are the causes? This is caused by germs (bacteria) in your genital area. These germs grow and cause swelling (inflammation) of your urinary tract. What increases the risk? You are more likely to develop this condition if:  You have a small, thin tube (catheter) to drain pee.  You cannot control when you pee or poop (incontinence).  You are female, and: ? You use these methods to prevent pregnancy: ? A medicine that kills sperm (spermicide). ? A device that blocks sperm (diaphragm). ? You have low levels of a female hormone (estrogen). ? You are pregnant.  You have genes that add to your risk.  You are sexually active.  You take antibiotic medicines.  You have trouble peeing because of: ? A prostate that is bigger than normal, if you are female. ? A blockage in the part of your body that drains pee from the bladder (urethra). ? A kidney stone. ? A nerve condition that affects your bladder (neurogenic bladder). ? Not getting enough to drink. ? Not peeing often enough.  You have other conditions, such as: ? Diabetes. ? A weak disease-fighting system (immune system). ? Sickle cell disease. ? Gout. ? Injury of the spine. What are the signs or symptoms? Symptoms of this condition include:  Needing to pee right away (urgently).  Peeing often.  Peeing small amounts often.  Pain or burning when peeing.  Blood in the pee.  Pee that smells bad or not like normal.  Trouble peeing.  Pee that is cloudy.  Fluid coming from the vagina, if you are female.  Pain in the belly or lower back. Other symptoms include:  Throwing up (vomiting).  No urge to eat.  Feeling mixed up (confused).  Being tired  and grouchy (irritable).  A fever.  Watery poop (diarrhea). How is this treated? This condition may be treated with:  Antibiotic medicine.  Other medicines.  Drinking enough water. Follow these instructions at home:  Medicines  Take over-the-counter and prescription medicines only as told by your doctor.  If you were prescribed an antibiotic medicine, take it as told by your doctor. Do not stop taking it even if you start to feel better. General instructions  Make sure you: ? Pee until your bladder is empty. ? Do not hold pee for a long time. ? Empty your bladder after sex. ? Wipe from front to back after pooping if you are a female. Use each tissue one time when you wipe.  Drink enough fluid to keep your pee pale yellow.  Keep all follow-up visits as told by your doctor. This is important. Contact a doctor if:  You do not get better after 1-2 days.  Your symptoms go away and then come back. Get help right away if:  You have very bad back pain.  You have very bad pain in your lower belly.  You have a fever.  You are sick to your stomach (nauseous).  You are throwing up. Summary  A urinary tract infection (UTI) is an infection of any part of the urinary tract.  This condition is caused by germs in your genital area.  There are many risk factors for a UTI. These include having a small, thin   tube to drain pee and not being able to control when you pee or poop.  Treatment includes antibiotic medicines for germs.  Drink enough fluid to keep your pee pale yellow. This information is not intended to replace advice given to you by your health care provider. Make sure you discuss any questions you have with your health care provider. Document Released: 10/07/2007 Document Revised: 04/07/2018 Document Reviewed: 10/28/2017 Elsevier Patient Education  2020 [employer]  

## 2019-02-22 NOTE — Progress Notes (Signed)
Subjective:    Patient ID: Kimberly Huff, female    DOB: [birth year], 73 y.o.   MRN: [id number]  HPI   Patient presents to clinic due to possible UTI.  Reports increased urinary frequency and urgency over the past 3 to 4 days and also some left-sided low back pain.  Patient also has issues with lumbar spine and arthritis type pain in low back so unsure if it is related to that or possible flank pain.  Patient does report history of kidney stones, but states this pain in the back does not feel similar to previous kidney stone issue.  No fever or chills.  No vomiting or diarrhea.  No severe abdominal pain.  Patient Active Problem List   Diagnosis Date Noted  . Acute left-sided thoracic back pain 12/30/2018  . Acute exacerbation of chronic low back pain 12/24/2018  . UTI (urinary tract infection) due to Enterococcus 12/24/2018  . Osteoporosis 11/12/2017  . Impacted cerumen of left ear 02/15/2017  . Urge incontinence of urine 02/25/2016  . Benign essential HTN 10/25/2015  . Atrophic kidney 10/25/2015  . History of myocardial infarction 10/25/2015  . Microalbuminuria due to type 2 diabetes mellitus ([place]) 06/29/2015  . Fracture of metatarsal of right foot, closed 06/27/2015  . Personal history of gastric ulcer 02/23/2013  . Nontraumatic shoulder pain, right 02/23/2013  . Vitamin D deficiency 02/23/2013  . History of right humeral fracture  02/16/2012  . Gouty arthritis 08/25/2011  . Type 2 diabetes mellitus, controlled, with renal complications ([place]) 56/25/6389  . Hemorrhoid 05/27/2011  . Obesity (BMI 30-39.9) 02/16/2011  . Dyslipidemia associated with type 2 diabetes mellitus ([place]) 02/16/2011  . Cataract extraction status 02/16/2011  . Nonfunctioning left kidney 02/16/2011  . NASH (nonalcoholic steatohepatitis) 02/16/2011   Social History   Tobacco Use  . Smoking status: Former Smoker    Types: Cigarettes    Quit date: 09/15/1998    Years since quitting: 20.4  . Smokeless  tobacco: Never Used  Substance Use Topics  . Alcohol use: Yes    Comment: very rare   Review of Systems  Constitutional: Negative for chills, fatigue and fever.  HENT: Negative for congestion, ear pain, sinus pain and sore throat.   Eyes: Negative.   Respiratory: Negative for cough, shortness of breath and wheezing.   Cardiovascular: Negative for chest pain, palpitations and leg swelling.  Gastrointestinal: Negative for abdominal pain, diarrhea, nausea and vomiting.  Genitourinary: +dysuria, frequency and urgency.  Musculoskeletal: Negative for arthralgias and myalgias.  Skin: Negative for color change, pallor and rash.  Neurological: Negative for syncope, light-headedness and headaches.  Psychiatric/Behavioral: The patient is not nervous/anxious.       Objective:   Physical Exam   Constitutional: She is oriented to person, place, and time. She appears well-developed and well-nourished. No distress.  HENT:  Head: Normocephalic and atraumatic.  Neck: Normal range of motion. Neck supple. Cardiovascular: Normal rate, regular rhythm and normal heart sounds.  Pulmonary/Chest: Effort normal and breath sounds normal. No respiratory distress. She has no wheezes. She has no rales.  Abdominal: Soft. Bowel sounds are normal. Mild suprapubic tenderness. No CVA tenderness with palpation Neurological: She is alert and oriented to person, place, and time.  Gait normal  Skin: Skin is warm and dry. No pallor.  Psychiatric: She has a normal mood and affect. Her behavior is normal. Thought content normal.   Nursing note and vitals reviewed.  Today's Vitals   02/22/19 1114  BP: 140/72  Pulse:  77  Temp: 98.8 F (37.1 C)  SpO2: 97%  Weight: 189 lb 3.2 oz (85.8 kg)  Height: 5' 2.25" (1.581 m)   Body mass index is 34.33 kg/m.    Assessment & Plan:    Urinary tract infection-patient's urinalysis positive for leukocytes and trace blood and positive nitrates so I am suspicious for UTI.  She  will take Omnicef twice daily for 5 days.  We will also send urine culture to lab to be sure we are in proper treatment.  Advised patient that if she develops worsening flank pains to let us know and we can order a CT renal study to rule out stone.  Patient would like to hold off on CT at this time and see if getting treated for the UTI helps her to feel better overall.  Patient will keep all regular follow-ups with PCP as planned and return to clinic sooner if any issues arise

## 2019-02-22 NOTE — Telephone Encounter (Signed)
Scheduled with Ander Purpura

## 2019-02-22 NOTE — Telephone Encounter (Signed)
Patient has been scheduled with Philis Nettle, FNP today @ 11:00 am.

## 2019-02-22 NOTE — Telephone Encounter (Signed)
Pt stated she believes she has a UTI. She has been having pain in her lower back on her left side near kidneys, frequent urination with little output. She would like to know if she can bring in a urine sample to be tested. Please advise. CB#[phone]

## 2019-02-24 LAB — URINE CULTURE
MICRO NUMBER:: 1013986
SPECIMEN QUALITY:: ADEQUATE

## 2019-02-24 MED ORDER — NITROFURANTOIN MONOHYD MACRO 100 MG PO CAPS: 100.0000 mg | ORAL_CAPSULE | Freq: Two times a day (BID) | ORAL | 0 refills | Status: AC

## 2019-02-24 NOTE — Addendum Note (Signed)
Addended by: Philis Nettle on: 02/24/2019 02:24 PM   Modules accepted: Orders

## 2019-03-03 ENCOUNTER — Other Ambulatory Visit: Payer: Self-pay | Admitting: Internal Medicine

## 2019-03-03 DIAGNOSIS — M810 Age-related osteoporosis without current pathological fracture: Secondary | ICD-10-CM

## 2019-03-03 DIAGNOSIS — M546 Pain in thoracic spine: Secondary | ICD-10-CM

## 2019-03-03 DIAGNOSIS — N390 Urinary tract infection, site not specified: Secondary | ICD-10-CM

## 2019-03-03 DIAGNOSIS — B952 Enterococcus as the cause of diseases classified elsewhere: Secondary | ICD-10-CM

## 2019-03-03 DIAGNOSIS — N289 Disorder of kidney and ureter, unspecified: Secondary | ICD-10-CM

## 2019-03-03 NOTE — Progress Notes (Signed)
mb ref to

## 2019-03-12 ENCOUNTER — Other Ambulatory Visit: Payer: Self-pay | Admitting: Internal Medicine

## 2019-03-12 MED ORDER — HYDROCODONE-ACETAMINOPHEN 5-325 MG PO TABS: 1.0000 | ORAL_TABLET | Freq: Four times a day (QID) | ORAL | 0 refills | Status: DC | PRN

## 2019-03-13 ENCOUNTER — Encounter (HOSPITAL_COMMUNITY): Payer: Self-pay

## 2019-03-13 ENCOUNTER — Other Ambulatory Visit: Payer: Self-pay

## 2019-03-13 ENCOUNTER — Other Ambulatory Visit: Payer: Self-pay | Admitting: Urology

## 2019-03-13 ENCOUNTER — Other Ambulatory Visit (HOSPITAL_COMMUNITY): Payer: Self-pay | Admitting: Urology

## 2019-03-13 ENCOUNTER — Ambulatory Visit (HOSPITAL_COMMUNITY)
Admission: RE | Admit: 2019-03-13 | Discharge: 2019-03-13 | Disposition: A | Discharge status: Home / Self Care | Payer: PPO | Source: Ambulatory Visit | Attending: Urology | Admitting: Urology

## 2019-03-13 DIAGNOSIS — N200 Calculus of kidney: Secondary | ICD-10-CM | POA: Insufficient documentation

## 2019-03-13 DIAGNOSIS — N3021 Other chronic cystitis with hematuria: Secondary | ICD-10-CM | POA: Diagnosis not present

## 2019-03-13 DIAGNOSIS — R109 Unspecified abdominal pain: Secondary | ICD-10-CM | POA: Diagnosis not present

## 2019-03-21 ENCOUNTER — Other Ambulatory Visit: Payer: Self-pay

## 2019-03-21 ENCOUNTER — Ambulatory Visit
Admission: RE | Admit: 2019-03-21 | Discharge: 2019-03-21 | Disposition: A | Discharge status: Home / Self Care | Payer: PPO | Source: Ambulatory Visit | Attending: Internal Medicine | Admitting: Internal Medicine

## 2019-03-21 DIAGNOSIS — M546 Pain in thoracic spine: Secondary | ICD-10-CM

## 2019-03-21 DIAGNOSIS — M810 Age-related osteoporosis without current pathological fracture: Secondary | ICD-10-CM | POA: Diagnosis not present

## 2019-03-27 ENCOUNTER — Encounter: Payer: Self-pay | Admitting: Internal Medicine

## 2019-03-27 ENCOUNTER — Other Ambulatory Visit: Payer: Self-pay

## 2019-03-27 ENCOUNTER — Ambulatory Visit (INDEPENDENT_AMBULATORY_CARE_PROVIDER_SITE_OTHER): Payer: PPO | Admitting: Internal Medicine

## 2019-03-27 DIAGNOSIS — R1900 Intra-abdominal and pelvic swelling, mass and lump, unspecified site: Secondary | ICD-10-CM

## 2019-03-27 DIAGNOSIS — N289 Disorder of kidney and ureter, unspecified: Secondary | ICD-10-CM | POA: Diagnosis not present

## 2019-03-27 DIAGNOSIS — M546 Pain in thoracic spine: Secondary | ICD-10-CM

## 2019-03-27 DIAGNOSIS — M47814 Spondylosis without myelopathy or radiculopathy, thoracic region: Secondary | ICD-10-CM | POA: Diagnosis not present

## 2019-03-27 NOTE — Progress Notes (Signed)
Virtual Visit via [email or web address]  This visit type was conducted due to national recommendations for restrictions regarding the COVID-19 pandemic (e.g. social distancing).  This format is felt to be most appropriate for this patient at this time.  All issues noted in this document were discussed and addressed.  No physical exam was performed (except for noted visual exam findings with Video Visits).   I connected with@ on 03/27/19 at  3:30 PM EST by a video enabled telemedicine application and verified that I am speaking with the correct person using two identifiers. Location patient: home Location provider: work or home office Persons participating in the virtual visit: patient, provider  I discussed the limitations, risks, security and privacy concerns of performing an evaluation and management service by telephone and the availability of in person appointments. I also discussed with the patient that there may be a patient responsible charge related to this service. The patient expressed understanding and agreed to proceed.  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   Recent MRI done noting  Multilevel disk bulges with no significant spinal canal or neural foraminal narrowing   She opines that a fair aoutn of her pain may be contributed by he ongoing pyelonephritis affecting her nonfunctioning left kidney (10% function, per urology)and she has been advised by her urologist to undergo left nephrectomy for this reason.  She remains contemplative but solicitous of my opinion before discussing with her children this week. She is currently taking antibiotics.   ROS:   Patient denies headache, fevers, malaise, unintentional weight loss, skin rash, eye pain,  sinus congestion and sinus pain, sore throat, dysphagia,  hemoptysis , cough, dyspnea, wheezing, chest pain, palpitations, orthopnea, edema, abdominal pain, nausea, melena, diarrhea, constipation,dysuria, hematuria, urinary  Frequency, nocturia, numbness, tingling, seizures,  Focal weakness, Loss of consciousness,  Tremor, insomnia, depression, anxiety, and suicidal ideation.      Past Medical History:  Diagnosis Date  . Allergy   . Atrophic kidney    left  . Blood transfusion   . Blood transfusion without reported diagnosis   . Cataract   . Clotting disorder (HCC)    blood clots in abdomen after surgery  . Coronary artery disease   . Coronary atherosclerosis   . Diabetes mellitus   . Diverticulosis 2005   Dr Jamal Collin  . External hemorrhoids without mention of complication 1657  . GERD (gastroesophageal reflux disease)   . H/O cardiac catheterization 2005  . Heart murmur 2012   found by Dr. Jamal Collin  . Hyperlipidemia   . Hypertension   . Hypopotassemia   . Kidney stone   . Myocardial infarction ([place]) 2000  . Neuromuscular disorder ([place])    "achy muscles"  . Obesity   . Osteoarthritis   . Recurrent UTI   . Spinal stenosis of cervical region   . Steatohepatitis 05/30/10   Brunt Stage 3, non alcoholic cirrhosis of the liver  . Type II or unspecified type diabetes mellitus without mention of complication, uncontrolled    Patient does takes Metformin and recommended to stop for 48 hours.  Marland Kitchen Ulcer   . Unspecified essential hypertension 2014    Past Surgical History:  Procedure Laterality Date  .  ABDOMINAL HYSTERECTOMY  1982   complete  . BREAST CYST ASPIRATION Right 1990's   benign  . BREAST MASS EXCISION     benign  . CARDIAC CATHETERIZATION  2000   cardiac stent  . cardiac stents  2000  . CATARACT EXTRACTION     both eyes  . COLONOSCOPY  2004, 2015   Dr. Jamal Collin, Dr. Olevia Perches  . CYSTOSCOPY W/ RETROGRADES Left 11/12/2015   Procedure: CYSTOSCOPY WITH RETROGRADE PYELOGRAM;   Surgeon: Cleon Gustin, MD;  Location: [facility];  Service: Urology;  Laterality: Left;  . CYSTOSCOPY W/ URETERAL STENT PLACEMENT Left 11/12/2015   Procedure: CYSTOSCOPY WITH STENT REPLACEMENT;  Surgeon: Cleon Gustin, MD;  Location: [facility];  Service: Urology;  Laterality: Left;  . CYSTOSCOPY WITH URETEROSCOPY AND STENT PLACEMENT Left 10/15/2015   Procedure: CYSTOSCOPY WITH URETEROSCOPY AND STENT PLACEMENT;  Surgeon: Cleon Gustin, MD;  Location: [facility] OR[facility];  Service: Urology;  Laterality: Left;  . EYE SURGERY Bilateral 2012   cataract  . FOOT SURGERY  2008  . PTCA  2000  . SALPINGOOPHORECTOMY    . TONSILLECTOMY    . UPPER GI ENDOSCOPY    . URETEROSCOPY WITH HOLMIUM LASER LITHOTRIPSY Left 11/12/2015   Procedure: URETEROSCOPY WITH HOLMIUM LASER LITHOTRIPSY;  Surgeon: Cleon Gustin, MD;  Location: [facility] OR[facility];  Service: Urology;  Laterality: Left;  . URETEROTOMY  1960/1969   x 2 during childhood  . VAGINAL HYSTERECTOMY     PT DENIES ABD HYSTERECTOMY    Family History  Problem Relation Age of Onset  . Lung cancer Father   . Heart disease Father   . Diverticulosis Mother   . Diabetes Mother   . Stroke Mother 95  . Autoimmune disease Daughter   . Thyroid disease Daughter   . Thyroid disease Sister   . Stomach cancer Maternal Aunt   . Breast cancer Maternal Aunt   . Colon cancer Neg Hx   . Esophageal cancer Neg Hx   . Rectal cancer Neg Hx     SOCIAL HX:  reports that she quit smoking about 20 years ago. Her smoking use included cigarettes. She has never used smokeless tobacco. She reports current alcohol use. She reports that she does not use drugs.   Current Outpatient Medications:  .  aspirin EC 325 MG tablet, Take 325 mg by mouth daily., Disp: , Rfl:  .  Cinnamon 500 MG capsule, Take by mouth., Disp: , Rfl:  .  furosemide (LASIX) 40 MG tablet, TAKE ONE TABLET EVERY DAY, Disp: 90 tablet, Rfl: 1 .  glucose blood (ONE TOUCH ULTRA TEST) test strip, Use as instructed,  Disp: 100 each, Rfl: 12 .  HYDROcodone-acetaminophen (NORCO/VICODIN) 5-325 MG tablet, Take 1 tablet by mouth every 6 (six) hours as needed for moderate pain., Disp: 30 tablet, Rfl: 0 .  losartan (COZAAR) 50 MG tablet, TAKE ONE TABLET EVERY DAY, Disp: 90 tablet, Rfl: 3 .  metFORMIN (GLUCOPHAGE) 850 MG tablet, TAKE ONE TABLET BY MOUTH TWICE DAILY WITH A MEAL, Disp: 180 tablet, Rfl: 1 .  nitrofurantoin, macrocrystal-monohydrate, (MACROBID) 100 MG capsule, Take 100 mg by mouth 2 (two) times daily., Disp: , Rfl:  .  omeprazole (PRILOSEC) 40 MG capsule, TAKE 1 CAPSULE BY MOUTH EVERY DAY, Disp: 90 capsule, Rfl: 1 .  potassium chloride (K-DUR) 10 MEQ tablet, TAKE ONE TABLET EVERY DAY, Disp: 90 tablet, Rfl: 1 .  rosuvastatin (CRESTOR) 10 MG tablet, TAKE ONE TABLET BY MOUTH EVERY DAY, Disp: 90 tablet, Rfl: 1  EXAM:  VITALS per patient if applicable:  GENERAL: alert, oriented, appears well and in no acute distress  HEENT: atraumatic, conjunttiva clear, no obvious abnormalities on inspection of external nose and ears  NECK: normal movements of the head and neck  LUNGS: on inspection no signs of respiratory distress, breathing rate appears normal, no obvious gross SOB, gasping or wheezing  CV: no obvious cyanosis  MS: moves all visible extremities without noticeable abnormality  PSYCH/NEURO: pleasant and cooperative, no obvious depression or anxiety, speech and thought processing grossly intact  ASSESSMENT AND PLAN:  Discussed the following assessment and plan:  Nonfunctioning left kidney  Acute left-sided thoracic back pain  Pelvic mass in female  Thoracic spondylosis without myelopathy  Nonfunctioning left kidney With recurrent pyogenic infections.  Agree with urology's recommendations to have an elective nephrectomy once her current infection has cleared.   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  Continue analgesics.  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 A low-grade retroperitoneal liposarcoma would be difficult to entirely exclude given slight interval increase in size from prior. Given the slow interval growth, a follow-up CT in 6-12 months could be considered to assess for stability. Alternatively, percutaneous biopsy could be considered for definitive diagnosis.  Not clear if Urology has discussed this finding with patient .  Will reach out to Dr Alyson Ingles prior to discussing with patient . He may be planning to obtain a tissue biopsy during the elective nephrectomy.  Thoracic spondylosis without myelopathy Secondary to degenerative changes  With resultant muscle spasm, based on plain films , MRI and  history.  ,  Continue MR and prn vicodin .  PT needed once recovered     I discussed the assessment and treatment plan with the patient. The patient was provided an opportunity to ask questions and all were answered. The patient agreed with the plan and demonstrated an understanding of the instructions.   The patient was advised to call back or seek an in-person evaluation if the symptoms worsen or if the condition fails to improve as anticipated.  I provided  25 minutes of non-face-to-face time during this encounter reviewing patient's current problems . Recent imaging studies   post surgeries.  Providing counseling on the above mentioned problems , and coordination  of care .  [name], MD

## 2019-03-28 DIAGNOSIS — M47814 Spondylosis without myelopathy or radiculopathy, thoracic region: Secondary | ICD-10-CM | POA: Insufficient documentation

## 2019-03-28 DIAGNOSIS — R1900 Intra-abdominal and pelvic swelling, mass and lump, unspecified site: Secondary | ICD-10-CM | POA: Insufficient documentation

## 2019-03-28 NOTE — Assessment & Plan Note (Signed)
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  Continue analgesics.  Prednisone C/I ,  NSAId's c/i

## 2019-03-28 NOTE — Assessment & Plan Note (Addendum)
Incidental finding on Mar 13 2019 CT ordered by urology :  There is a 4.1 cm presacral soft tissue mass containing fat favored to represent a presacral myelolipoma. A low-grade retroperitoneal liposarcoma would be difficult to entirely exclude given slight interval increase in size from prior. Given the slow interval growth, a follow-up CT in 6-12 months could be considered to assess for stability. Alternatively, percutaneous biopsy could be considered for definitive diagnosis.  Not clear if Urology has discussed this finding with patient .  Will reach out to Dr Alyson Ingles prior to discussing with patient . He may be planning to obtain a tissue biopsy during the elective nephrectomy.

## 2019-03-28 NOTE — Assessment & Plan Note (Signed)
Secondary to degenerative changes  With resultant muscle spasm, based on plain films , MRI and  history.  ,  Continue MR and prn vicodin .  PT needed once recovered

## 2019-03-28 NOTE — Assessment & Plan Note (Signed)
With recurrent pyogenic infections.  Agree with urology's recommendations to have an elective nephrectomy once her current infection has cleared.

## 2019-03-29 ENCOUNTER — Other Ambulatory Visit: Payer: Self-pay | Admitting: Urology

## 2019-04-11 ENCOUNTER — Other Ambulatory Visit: Payer: Self-pay | Admitting: Internal Medicine

## 2019-04-13 DIAGNOSIS — N3000 Acute cystitis without hematuria: Secondary | ICD-10-CM | POA: Diagnosis not present

## 2019-04-19 DIAGNOSIS — Z20828 Contact with and (suspected) exposure to other viral communicable diseases: Secondary | ICD-10-CM | POA: Diagnosis not present

## 2019-05-03 DIAGNOSIS — Z20828 Contact with and (suspected) exposure to other viral communicable diseases: Secondary | ICD-10-CM | POA: Diagnosis not present

## 2019-05-04 DIAGNOSIS — N3021 Other chronic cystitis with hematuria: Secondary | ICD-10-CM | POA: Diagnosis not present

## 2019-05-10 ENCOUNTER — Telehealth: Payer: Self-pay | Admitting: Internal Medicine

## 2019-05-10 NOTE — Telephone Encounter (Signed)
Prolia verification in process, patient due 05/24/19

## 2019-05-16 ENCOUNTER — Other Ambulatory Visit: Payer: Self-pay | Admitting: Internal Medicine

## 2019-05-22 DIAGNOSIS — N270 Small kidney, unilateral: Secondary | ICD-10-CM | POA: Diagnosis not present

## 2019-05-22 DIAGNOSIS — N3021 Other chronic cystitis with hematuria: Secondary | ICD-10-CM | POA: Diagnosis not present

## 2019-05-23 ENCOUNTER — Other Ambulatory Visit: Payer: Self-pay | Admitting: Internal Medicine

## 2019-05-23 DIAGNOSIS — R809 Proteinuria, unspecified: Secondary | ICD-10-CM

## 2019-05-23 DIAGNOSIS — E785 Hyperlipidemia, unspecified: Secondary | ICD-10-CM

## 2019-05-23 DIAGNOSIS — K7581 Nonalcoholic steatohepatitis (NASH): Secondary | ICD-10-CM

## 2019-05-23 DIAGNOSIS — E1129 Type 2 diabetes mellitus with other diabetic kidney complication: Secondary | ICD-10-CM

## 2019-05-23 DIAGNOSIS — E1169 Type 2 diabetes mellitus with other specified complication: Secondary | ICD-10-CM

## 2019-05-23 NOTE — Telephone Encounter (Signed)
Not in current medication list

## 2019-05-24 ENCOUNTER — Other Ambulatory Visit: Payer: Self-pay | Admitting: Urology

## 2019-05-26 NOTE — Patient Instructions (Signed)
DUE TO COVID-19 ONLY ONE VISITOR IS ALLOWED TO COME WITH YOU AND STAY IN THE WAITING ROOM ONLY DURING PRE OP AND PROCEDURE DAY OF SURGERY. THE 1 VISITOR MAY VISIT WITH YOU AFTER SURGERY IN YOUR PRIVATE ROOM DURING VISITING HOURS ONLY!  YOU NEED TO HAVE A COVID 19 TEST ON__1/26_____ @___8 :45____, THIS TEST MUST BE DONE BEFORE SURGERY, COME  [address], [place] , [address].  ([place]) ONCE YOUR COVID TEST IS COMPLETED, PLEASE BEGIN THE QUARANTINE INSTRUCTIONS AS OUTLINED IN YOUR HANDOUT.                [name]    Your procedure is scheduled on: 06/02/19   Report to [facility] Main  Entrance   Report to Short Stay 5:30 AM     Call this number if you have problems the morning of surgery [phone]    Remember: Do not eat food or drink liquids :After Midnight.   BRUSH YOUR TEETH MORNING OF SURGERY AND RINSE YOUR MOUTH OUT, NO CHEWING GUM CANDY OR MINTS.     Take these medicines the morning of surgery with A SIP OF WATER:  Prilosec, Allegra, rosuvastatin  DO NOT TAKE ANY DIABETIC MEDICATIONS DAY OF YOUR SURGERY    How to Manage Your Diabetes Before and After Surgery  Why is it important to control my blood sugar before and after surgery? . Improving blood sugar levels before and after surgery helps healing and can limit problems. . A way of improving blood sugar control is eating a healthy diet by: o  Eating less sugar and carbohydrates o  Increasing activity/exercise o  Talking with your doctor about reaching your blood sugar goals . High blood sugars (greater than 180 mg/dL) can raise your risk of infections and slow your recovery, so you will need to focus on controlling your diabetes during the weeks before surgery. . Make sure that the doctor who takes care of your diabetes knows about your planned surgery including the date and location.  How do I manage my blood sugar before surgery? . Check your blood sugar at least 4 times a day,  starting 2 days before surgery, to make sure that the level is not too high or low. o Check your blood sugar the morning of your surgery when you wake up and every 2 hours until you get to the Short Stay unit. . If your blood sugar is less than 70 mg/dL, you will need to treat for low blood sugar: o Do not take insulin. o Treat a low blood sugar (less than 70 mg/dL) with  cup of clear juice (cranberry or apple), 4 glucose tablets, OR glucose gel. o Recheck blood sugar in 15 minutes after treatment (to make sure it is greater than 70 mg/dL). If your blood sugar is not greater than 70 mg/dL on recheck, call [phone] for further instructions. . Report your blood sugar to the short stay nurse when you get to Short Stay.  . If you are admitted to the hospital after surgery: o Your blood sugar will be checked by the staff and you will probably be given insulin after surgery (instead of oral diabetes medicines) to make sure you have good blood sugar levels. o The goal for blood sugar control after surgery is 80-180 mg/dL.   WHAT DO I DO ABOUT MY DIABETES MEDICATION?  Marland Kitchen Do not take oral diabetes medicines (pills) the morning of surgery.  You may not have any metal on your body including hair pins and              piercings  Do not wear jewelry, make-up, lotions, powders or perfumes, deodorant             Do not wear nail polish on your fingernails.  Do not shave  48 hours prior to surgery.            Do not bring valuables to the hospital. [place].  Contacts, dentures or bridgework may not be worn into surgery.                    Please read over the following fact sheets you were given: _____________________________________________________________________             [facility] - Preparing for Surgery  Before surgery, you can play an important role.   Because skin is not sterile, your skin needs to be as  free of germs as possible.   You can reduce the number of germs on your skin by washing with CHG (chlorahexidine gluconate) soap before surgery .  CHG is an antiseptic cleaner which kills germs and bonds with the skin to continue killing germs even after washing. Please DO NOT use if you have an allergy to CHG or antibacterial soaps.   If your skin becomes reddened/irritated stop using the CHG and inform your nurse when you arrive at Short Stay. Do not shave (including legs and underarms) for at least 48 hours prior to the first CHG shower.   . Please follow these instructions carefully:  1.  Shower with CHG Soap the night before surgery and the  morning of Surgery.  2.  If you choose to wash your hair, wash your hair first as usual with your  normal  shampoo.  3.  After you shampoo, rinse your hair and body thoroughly to remove the  shampoo.                                        4.  Use CHG as you would any other liquid soap.  You can apply chg directly  to the skin and wash                       Gently with a scrungie or clean washcloth.  5.  Apply the CHG Soap to your body ONLY FROM THE NECK DOWN.   Do not use on face/ open                           Wound or open sores. Avoid contact with eyes, ears mouth and genitals (private parts).                       Wash face,  Genitals (private parts) with your normal soap.             6.  Wash thoroughly, paying special attention to the area where your surgery  will be performed.  7.  Thoroughly rinse your body with warm water from the neck down.  8.  DO NOT shower/wash with your normal soap after using and rinsing off  the  CHG Soap.             9.  Pat yourself dry with a clean towel.            10.  Wear clean pajamas.            11.  Place clean sheets on your bed the night of your first shower and do not  sleep with pets. Day of Surgery : Do not apply any lotions/deodorants the morning of surgery.  Please wear clean clothes to the  hospital/surgery center.  FAILURE TO FOLLOW THESE INSTRUCTIONS MAY RESULT IN THE CANCELLATION OF YOUR SURGERY PATIENT SIGNATURE_________________________________  NURSE SIGNATURE__________________________________  ________________________________________________________________________

## 2019-05-29 ENCOUNTER — Encounter (HOSPITAL_COMMUNITY)
Admission: RE | Admit: 2019-05-29 | Discharge: 2019-05-29 | Disposition: A | Discharge status: Home / Self Care | Payer: PPO | Source: Ambulatory Visit | Attending: Urology | Admitting: Urology

## 2019-05-29 ENCOUNTER — Encounter (HOSPITAL_COMMUNITY): Payer: Self-pay

## 2019-05-29 ENCOUNTER — Other Ambulatory Visit: Payer: Self-pay

## 2019-05-29 DIAGNOSIS — E118 Type 2 diabetes mellitus with unspecified complications: Secondary | ICD-10-CM | POA: Insufficient documentation

## 2019-05-29 DIAGNOSIS — E785 Hyperlipidemia, unspecified: Secondary | ICD-10-CM | POA: Diagnosis not present

## 2019-05-29 DIAGNOSIS — I252 Old myocardial infarction: Secondary | ICD-10-CM | POA: Diagnosis not present

## 2019-05-29 DIAGNOSIS — Z7982 Long term (current) use of aspirin: Secondary | ICD-10-CM | POA: Diagnosis not present

## 2019-05-29 DIAGNOSIS — I10 Essential (primary) hypertension: Secondary | ICD-10-CM | POA: Insufficient documentation

## 2019-05-29 DIAGNOSIS — Z7984 Long term (current) use of oral hypoglycemic drugs: Secondary | ICD-10-CM | POA: Diagnosis not present

## 2019-05-29 DIAGNOSIS — Z79899 Other long term (current) drug therapy: Secondary | ICD-10-CM | POA: Diagnosis not present

## 2019-05-29 DIAGNOSIS — K219 Gastro-esophageal reflux disease without esophagitis: Secondary | ICD-10-CM | POA: Diagnosis not present

## 2019-05-29 DIAGNOSIS — Z87891 Personal history of nicotine dependence: Secondary | ICD-10-CM | POA: Diagnosis not present

## 2019-05-29 DIAGNOSIS — N289 Disorder of kidney and ureter, unspecified: Secondary | ICD-10-CM | POA: Diagnosis not present

## 2019-05-29 DIAGNOSIS — Z20822 Contact with and (suspected) exposure to covid-19: Secondary | ICD-10-CM | POA: Diagnosis not present

## 2019-05-29 DIAGNOSIS — E876 Hypokalemia: Secondary | ICD-10-CM | POA: Insufficient documentation

## 2019-05-29 DIAGNOSIS — R011 Cardiac murmur, unspecified: Secondary | ICD-10-CM | POA: Insufficient documentation

## 2019-05-29 DIAGNOSIS — M810 Age-related osteoporosis without current pathological fracture: Secondary | ICD-10-CM | POA: Insufficient documentation

## 2019-05-29 DIAGNOSIS — K7581 Nonalcoholic steatohepatitis (NASH): Secondary | ICD-10-CM | POA: Diagnosis not present

## 2019-05-29 DIAGNOSIS — I2510 Atherosclerotic heart disease of native coronary artery without angina pectoris: Secondary | ICD-10-CM | POA: Insufficient documentation

## 2019-05-29 DIAGNOSIS — K5790 Diverticulosis of intestine, part unspecified, without perforation or abscess without bleeding: Secondary | ICD-10-CM | POA: Insufficient documentation

## 2019-05-29 DIAGNOSIS — Z01818 Encounter for other preprocedural examination: Secondary | ICD-10-CM | POA: Diagnosis not present

## 2019-05-29 NOTE — Progress Notes (Signed)
PCP - Dr. Derrel Nip Cardiologist - Dr. Clayborn Bigness  Chest x-ray - no EKG - 05/30/19 Stress Test - no ECHO - no Cardiac Cath - no  Sleep Study - no CPAP -   Fasting Blood Sugar - 120-150 Checks Blood Sugar ___QD__ times a day  Blood Thinner Instructions:ASA Aspirin Instructions:Dr. McKenzie said for her to continue taking Last Dose:NA  Anesthesia review:   Patient denies shortness of breath, fever, cough and chest pain at PAT appointment Yes  Patient verbalized understanding of instructions that were given to them at the PAT appointment. Patient was also instructed that they will need to review over the PAT instructions again at home before surgery. Yes

## 2019-05-30 ENCOUNTER — Encounter (HOSPITAL_COMMUNITY)
Admission: RE | Admit: 2019-05-30 | Discharge: 2019-05-30 | Disposition: A | Discharge status: Home / Self Care | Payer: PPO | Source: Ambulatory Visit | Attending: Urology | Admitting: Urology

## 2019-05-30 ENCOUNTER — Other Ambulatory Visit (HOSPITAL_COMMUNITY)
Admission: RE | Admit: 2019-05-30 | Discharge: 2019-05-30 | Disposition: A | Discharge status: Home / Self Care | Payer: PPO | Source: Ambulatory Visit | Attending: Urology | Admitting: Urology

## 2019-05-30 DIAGNOSIS — N289 Disorder of kidney and ureter, unspecified: Secondary | ICD-10-CM | POA: Diagnosis not present

## 2019-05-30 DIAGNOSIS — K219 Gastro-esophageal reflux disease without esophagitis: Secondary | ICD-10-CM | POA: Diagnosis not present

## 2019-05-30 DIAGNOSIS — Z01818 Encounter for other preprocedural examination: Secondary | ICD-10-CM | POA: Diagnosis not present

## 2019-05-30 DIAGNOSIS — E118 Type 2 diabetes mellitus with unspecified complications: Secondary | ICD-10-CM | POA: Diagnosis not present

## 2019-05-30 DIAGNOSIS — I2510 Atherosclerotic heart disease of native coronary artery without angina pectoris: Secondary | ICD-10-CM | POA: Diagnosis not present

## 2019-05-30 DIAGNOSIS — K7581 Nonalcoholic steatohepatitis (NASH): Secondary | ICD-10-CM | POA: Diagnosis not present

## 2019-05-30 DIAGNOSIS — K5790 Diverticulosis of intestine, part unspecified, without perforation or abscess without bleeding: Secondary | ICD-10-CM | POA: Diagnosis not present

## 2019-05-30 DIAGNOSIS — Z20822 Contact with and (suspected) exposure to covid-19: Secondary | ICD-10-CM | POA: Diagnosis not present

## 2019-05-30 DIAGNOSIS — E876 Hypokalemia: Secondary | ICD-10-CM | POA: Diagnosis not present

## 2019-05-30 DIAGNOSIS — I10 Essential (primary) hypertension: Secondary | ICD-10-CM | POA: Diagnosis not present

## 2019-05-30 DIAGNOSIS — E785 Hyperlipidemia, unspecified: Secondary | ICD-10-CM | POA: Diagnosis not present

## 2019-05-30 DIAGNOSIS — I252 Old myocardial infarction: Secondary | ICD-10-CM | POA: Diagnosis not present

## 2019-05-30 LAB — HEMOGLOBIN A1C
Hgb A1c MFr Bld: 6 % — ABNORMAL HIGH (ref 4.8–5.6)
Mean Plasma Glucose: 125.5 mg/dL

## 2019-05-30 LAB — BASIC METABOLIC PANEL
Anion gap: 10 (ref 5–15)
BUN: 25 mg/dL — ABNORMAL HIGH (ref 8–23)
CO2: 25 mmol/L (ref 22–32)
Calcium: 10.3 mg/dL (ref 8.9–10.3)
Chloride: 105 mmol/L (ref 98–111)
Creatinine, Ser: 0.82 mg/dL (ref 0.44–1.00)
GFR calc Af Amer: >60 mL/min (ref >60)
GFR calc non Af Amer: >60 mL/min (ref >60)
Glucose, Bld: 135 mg/dL — ABNORMAL HIGH (ref 70–99)
Potassium: 4.9 mmol/L (ref 3.5–5.1)
Sodium: 140 mmol/L (ref 135–145)

## 2019-05-30 LAB — CBC
HCT: 44.1 % (ref 36.0–46.0)
Hemoglobin: 14 g/dL (ref 12.0–15.0)
MCH: 27.7 pg (ref 26.0–34.0)
MCHC: 31.7 g/dL (ref 30.0–36.0)
MCV: 87.3 fL (ref 80.0–100.0)
Platelets: 160 10*3/uL (ref 150–400)
RBC: 5.05 MIL/uL (ref 3.87–5.11)
RDW: 13.1 % (ref 11.5–15.5)
WBC: 6.9 10*3/uL (ref 4.0–10.5)
nRBC: 0 % (ref 0.0–0.2)

## 2019-05-30 LAB — SARS CORONAVIRUS 2 (TAT 6-24 HRS): SARS Coronavirus 2: NEGATIVE

## 2019-05-30 LAB — ABO/RH: ABO/RH(D): A POS

## 2019-05-31 ENCOUNTER — Telehealth: Payer: Self-pay | Admitting: Internal Medicine

## 2019-05-31 NOTE — Telephone Encounter (Signed)
FYI  Both covid vaccines have been documented. Pt stated that she got the second dose yesterday and developed a fever and just feeling sick this morning. She stated that she took some tylenol and aspirin and feels much better this afternoon. She also wanted to let you know that she got her labs done at [facility] for her surgery. She just wanted to point out that her A1c has dropped to 6.0.

## 2019-05-31 NOTE — Telephone Encounter (Signed)
Pt wanted you to know that she received her second covid vaccine and is having a couple of side effects. Fever and general feeling of being sick. She also wanted to know if we had received her lab results? She would like a call back asap. Thanks

## 2019-06-01 MED ORDER — GENTAMICIN SULFATE 40 MG/ML IJ SOLN
5.0000 mg/kg | INTRAVENOUS | Status: AC
  Administered 2019-06-02: 08:00:00 320 mg via INTRAVENOUS
  Filled 2019-06-01: qty 8

## 2019-06-01 NOTE — Progress Notes (Signed)
Anesthesia Chart Review   Case: [id number] Date/Time: 06/02/19 0645   Procedure: XI ROBOTIC ASSITED NEPHRECTOMY (Left )   Anesthesia type: General   Pre-op diagnosis: LEFT NON FUNCTIONING KIDNEY   Location: [facility] ROOM 03 / [facility]   Surgeons: Cleon Gustin, MD      DISCUSSION:74 y.o. former smoker (quit 09/15/98) with h/o HTN, HLD, CAD (NSTEMI, PCI, stent 09/1998), GERD, DM II, NASH, left non functioning kidney scheduled for above procedure 06/02/19 with Dr. Nicolette Bang   Pt last seen by cardiologist, Dr. Clayborn Bigness, 10/03/2018.  Stable at this visit with 1 year follow up recommended.    Anticipate pt can proceed with planned procedure barring acute status change.   VS: BP (!) 173/80 (BP Location: Right Arm)   Pulse 75   Temp 36.9 C (Oral)   Resp 18   Ht 5' 3"  (1.6 m)   Wt 82.6 kg   SpO2 98%   BMI 32.24 kg/m   PROVIDERS: [name], MD is PCP   Jobe Gibbon, MD is Cardiologist  LABS: Labs reviewed: Acceptable for surgery. (all labs ordered are listed, but only abnormal results are displayed)  Labs Reviewed  BASIC METABOLIC PANEL - Abnormal; Notable for the following components:      Result Value   Glucose, Bld 135 (*)    BUN 25 (*)    All other components within normal limits  HEMOGLOBIN A1C - Abnormal; Notable for the following components:   Hgb A1c MFr Bld 6.0 (*)    All other components within normal limits  CBC  TYPE AND SCREEN  ABO/RH     IMAGES:   EKG: 05/30/19 Rate 71 bpm Normal sinus rhythm Possible Inferior infarct , age undetermined Possible Anterolateral infarct , age undetermined Abnormal ECG No significant change since last tracing  CV:  Past Medical History:  Diagnosis Date  . Allergy   . Atrophic kidney    left  . Blood transfusion   . Blood transfusion without reported diagnosis   . Cataract   . Clotting disorder (HCC)    blood clots in abdomen after surgery  . Coronary artery disease   . Coronary atherosclerosis    . Diabetes mellitus   . Diverticulosis 2005   Dr Jamal Collin  . External hemorrhoids without mention of complication 3151  . GERD (gastroesophageal reflux disease)   . H/O cardiac catheterization 2005  . Heart murmur 2012   found by Dr. Jamal Collin  . Hyperlipidemia   . Hypertension   . Hypopotassemia   . Kidney stone   . Myocardial infarction ([place]) 2000  . Neuromuscular disorder ([place])    "achy muscles"  . Obesity   . Osteoarthritis   . Recurrent UTI   . Spinal stenosis of cervical region   . Steatohepatitis 05/30/10   Brunt Stage 3, non alcoholic cirrhosis of the liver  . Type II or unspecified type diabetes mellitus without mention of complication, uncontrolled    Patient does takes Metformin and recommended to stop for 48 hours.  Marland Kitchen Ulcer   . Unspecified essential hypertension 2014    Past Surgical History:  Procedure Laterality Date  . ABDOMINAL HYSTERECTOMY  1982   complete  . BREAST CYST ASPIRATION Right 1990's   benign  . BREAST MASS EXCISION     benign  . CARDIAC CATHETERIZATION  2000   cardiac stent  . cardiac stents  2000  . CATARACT EXTRACTION     both eyes  . COLONOSCOPY  2004, 2015  Dr. Jamal Collin, Dr. Olevia Perches  . CYSTOSCOPY W/ RETROGRADES Left 11/12/2015   Procedure: CYSTOSCOPY WITH RETROGRADE PYELOGRAM;  Surgeon: Cleon Gustin, MD;  Location: [facility] OR[facility];  Service: Urology;  Laterality: Left;  . CYSTOSCOPY W/ URETERAL STENT PLACEMENT Left 11/12/2015   Procedure: CYSTOSCOPY WITH STENT REPLACEMENT;  Surgeon: Cleon Gustin, MD;  Location: [facility] OR[facility];  Service: Urology;  Laterality: Left;  . CYSTOSCOPY WITH URETEROSCOPY AND STENT PLACEMENT Left 10/15/2015   Procedure: CYSTOSCOPY WITH URETEROSCOPY AND STENT PLACEMENT;  Surgeon: Cleon Gustin, MD;  Location: [facility] ORS;  Service: Urology;  Laterality: Left;  . EYE SURGERY Bilateral 2012   cataract  . FOOT SURGERY  2008  . PTCA  2000  . SALPINGOOPHORECTOMY    . TONSILLECTOMY    . UPPER GI ENDOSCOPY    .  URETEROSCOPY WITH HOLMIUM LASER LITHOTRIPSY Left 11/12/2015   Procedure: URETEROSCOPY WITH HOLMIUM LASER LITHOTRIPSY;  Surgeon: Cleon Gustin, MD;  Location: [facility] ORS;  Service: Urology;  Laterality: Left;  . URETEROTOMY  1960/1969   x 2 during childhood  . VAGINAL HYSTERECTOMY     PT DENIES ABD HYSTERECTOMY    MEDICATIONS: . amoxicillin-clavulanate (AUGMENTIN) 500-125 MG tablet  . aspirin EC 325 MG tablet  . Cinnamon 500 MG capsule  . fexofenadine (ALLEGRA) 180 MG tablet  . furosemide (LASIX) 40 MG tablet  . glucose blood (ONE TOUCH ULTRA TEST) test strip  . HYDROcodone-acetaminophen (NORCO/VICODIN) 5-325 MG tablet  . IBU 800 MG tablet  . loperamide (IMODIUM A-D) 2 MG tablet  . losartan (COZAAR) 50 MG tablet  . metFORMIN (GLUCOPHAGE) 850 MG tablet  . omeprazole (PRILOSEC) 40 MG capsule  . potassium chloride (K-DUR) 10 MEQ tablet  . Probiotic CAPS  . rosuvastatin (CRESTOR) 10 MG tablet   No current facility-administered medications for this encounter.   Derrill Memo ON 06/02/2019] gentamicin (GARAMYCIN) 320 mg in dextrose 5 % 100 mL IVPB    Konrad Felix, PA-C [facility] Pre-Surgical Testing [phone] 10:42 AM

## 2019-06-01 NOTE — Anesthesia Preprocedure Evaluation (Addendum)
Anesthesia Evaluation  Patient identified by MRN, date of birth, ID band Patient awake    Reviewed: Allergy & Precautions, NPO status , Patient's Chart, lab work & pertinent test results  History of Anesthesia Complications Negative for: history of anesthetic complications  Airway Mallampati: II  TM Distance: >3 FB Neck ROM: Full    Dental  (+) Dental Advisory Given, Teeth Intact   Pulmonary former smoker,    Pulmonary exam normal        Cardiovascular hypertension, Pt. on medications + CAD, + Past MI and + Cardiac Stents   Rhythm:Regular Rate:Normal + Systolic murmurs    Neuro/Psych negative neurological ROS  negative psych ROS   GI/Hepatic GERD  Medicated and Controlled,(+) Hepatitis -  Endo/Other  diabetes, Type 2, Oral Hypoglycemic Agents Obesity   Renal/GU Renal disease Atrophic left kidney      Musculoskeletal  (+) Arthritis ,   Abdominal   Peds  Hematology negative hematology ROS (+)   Anesthesia Other Findings Covid neg 1/26  Reproductive/Obstetrics                            Anesthesia Physical Anesthesia Plan  ASA: III  Anesthesia Plan: General   Post-op Pain Management:    Induction: Intravenous  PONV Risk Score and Plan: 4 or greater and Treatment may vary due to age or medical condition, Ondansetron and Dexamethasone  Airway Management Planned: Oral ETT  Additional Equipment: None  Intra-op Plan:   Post-operative Plan: Extubation in OR  Informed Consent: I have reviewed the patients History and Physical, chart, labs and discussed the procedure including the risks, benefits and alternatives for the proposed anesthesia with the patient or authorized representative who has indicated his/her understanding and acceptance.     Dental advisory given  Plan Discussed with: CRNA and Anesthesiologist  Anesthesia Plan Comments:        Anesthesia Quick  Evaluation

## 2019-06-02 ENCOUNTER — Encounter (HOSPITAL_COMMUNITY): Payer: Self-pay | Admitting: Urology

## 2019-06-02 ENCOUNTER — Encounter (HOSPITAL_COMMUNITY)
Admission: RE | Disposition: A | Discharge status: Home / Self Care | Payer: Self-pay | Source: Other Acute Inpatient Hospital | Attending: Urology

## 2019-06-02 ENCOUNTER — Inpatient Hospital Stay (HOSPITAL_COMMUNITY): Payer: PPO | Admitting: Physician Assistant

## 2019-06-02 ENCOUNTER — Inpatient Hospital Stay (HOSPITAL_COMMUNITY): Payer: PPO | Admitting: Certified Registered Nurse Anesthetist

## 2019-06-02 ENCOUNTER — Other Ambulatory Visit: Payer: Self-pay

## 2019-06-02 ENCOUNTER — Inpatient Hospital Stay (HOSPITAL_COMMUNITY)
Admission: RE | Admit: 2019-06-02 | Discharge: 2019-06-05 | DRG: 659 | Disposition: A | Discharge status: Home / Self Care | Payer: PPO | Source: Other Acute Inpatient Hospital | Attending: Urology | Admitting: Urology

## 2019-06-02 DIAGNOSIS — I2510 Atherosclerotic heart disease of native coronary artery without angina pectoris: Secondary | ICD-10-CM | POA: Diagnosis not present

## 2019-06-02 DIAGNOSIS — N269 Renal sclerosis, unspecified: Secondary | ICD-10-CM | POA: Diagnosis not present

## 2019-06-02 DIAGNOSIS — I120 Hypertensive chronic kidney disease with stage 5 chronic kidney disease or end stage renal disease: Principal | ICD-10-CM | POA: Diagnosis present

## 2019-06-02 DIAGNOSIS — I252 Old myocardial infarction: Secondary | ICD-10-CM

## 2019-06-02 DIAGNOSIS — Z801 Family history of malignant neoplasm of trachea, bronchus and lung: Secondary | ICD-10-CM | POA: Diagnosis not present

## 2019-06-02 DIAGNOSIS — K219 Gastro-esophageal reflux disease without esophagitis: Secondary | ICD-10-CM | POA: Diagnosis not present

## 2019-06-02 DIAGNOSIS — Z87891 Personal history of nicotine dependence: Secondary | ICD-10-CM | POA: Diagnosis not present

## 2019-06-02 DIAGNOSIS — R110 Nausea: Secondary | ICD-10-CM | POA: Diagnosis not present

## 2019-06-02 DIAGNOSIS — Z8349 Family history of other endocrine, nutritional and metabolic diseases: Secondary | ICD-10-CM | POA: Diagnosis not present

## 2019-06-02 DIAGNOSIS — E1122 Type 2 diabetes mellitus with diabetic chronic kidney disease: Secondary | ICD-10-CM | POA: Diagnosis present

## 2019-06-02 DIAGNOSIS — N289 Disorder of kidney and ureter, unspecified: Secondary | ICD-10-CM | POA: Diagnosis not present

## 2019-06-02 DIAGNOSIS — Z20822 Contact with and (suspected) exposure to covid-19: Secondary | ICD-10-CM | POA: Diagnosis not present

## 2019-06-02 DIAGNOSIS — N261 Atrophy of kidney (terminal): Secondary | ICD-10-CM | POA: Diagnosis not present

## 2019-06-02 DIAGNOSIS — N159 Renal tubulo-interstitial disease, unspecified: Secondary | ICD-10-CM | POA: Diagnosis not present

## 2019-06-02 DIAGNOSIS — I10 Essential (primary) hypertension: Secondary | ICD-10-CM | POA: Diagnosis not present

## 2019-06-02 DIAGNOSIS — Z90710 Acquired absence of both cervix and uterus: Secondary | ICD-10-CM

## 2019-06-02 DIAGNOSIS — Z8249 Family history of ischemic heart disease and other diseases of the circulatory system: Secondary | ICD-10-CM | POA: Diagnosis not present

## 2019-06-02 DIAGNOSIS — Z823 Family history of stroke: Secondary | ICD-10-CM

## 2019-06-02 DIAGNOSIS — Z955 Presence of coronary angioplasty implant and graft: Secondary | ICD-10-CM

## 2019-06-02 DIAGNOSIS — Z6832 Body mass index (BMI) 32.0-32.9, adult: Secondary | ICD-10-CM | POA: Diagnosis not present

## 2019-06-02 DIAGNOSIS — Z87440 Personal history of urinary (tract) infections: Secondary | ICD-10-CM

## 2019-06-02 DIAGNOSIS — E669 Obesity, unspecified: Secondary | ICD-10-CM | POA: Diagnosis not present

## 2019-06-02 DIAGNOSIS — Z800 Family history of malignant neoplasm of digestive organs: Secondary | ICD-10-CM | POA: Diagnosis not present

## 2019-06-02 DIAGNOSIS — Z79899 Other long term (current) drug therapy: Secondary | ICD-10-CM

## 2019-06-02 DIAGNOSIS — Z833 Family history of diabetes mellitus: Secondary | ICD-10-CM | POA: Diagnosis not present

## 2019-06-02 DIAGNOSIS — Z7982 Long term (current) use of aspirin: Secondary | ICD-10-CM | POA: Diagnosis not present

## 2019-06-02 DIAGNOSIS — Z7984 Long term (current) use of oral hypoglycemic drugs: Secondary | ICD-10-CM | POA: Diagnosis not present

## 2019-06-02 DIAGNOSIS — Z803 Family history of malignant neoplasm of breast: Secondary | ICD-10-CM | POA: Diagnosis not present

## 2019-06-02 DIAGNOSIS — N2889 Other specified disorders of kidney and ureter: Secondary | ICD-10-CM | POA: Diagnosis not present

## 2019-06-02 DIAGNOSIS — N186 End stage renal disease: Secondary | ICD-10-CM | POA: Diagnosis not present

## 2019-06-02 DIAGNOSIS — N119 Chronic tubulo-interstitial nephritis, unspecified: Secondary | ICD-10-CM | POA: Diagnosis not present

## 2019-06-02 DIAGNOSIS — Z905 Acquired absence of kidney: Secondary | ICD-10-CM

## 2019-06-02 DIAGNOSIS — N12 Tubulo-interstitial nephritis, not specified as acute or chronic: Secondary | ICD-10-CM | POA: Diagnosis not present

## 2019-06-02 HISTORY — PX: ROBOTIC ASSITED PARTIAL NEPHRECTOMY: SHX6087

## 2019-06-02 LAB — TYPE AND SCREEN
ABO/RH(D): A POS
Antibody Screen: NEGATIVE

## 2019-06-02 LAB — GLUCOSE, CAPILLARY
Glucose-Capillary: 133 mg/dL — ABNORMAL HIGH (ref 70–99)
Glucose-Capillary: 148 mg/dL — ABNORMAL HIGH (ref 70–99)
Glucose-Capillary: 145 mg/dL — ABNORMAL HIGH (ref 70–99)
Glucose-Capillary: 162 mg/dL — ABNORMAL HIGH (ref 70–99)

## 2019-06-02 LAB — HEMOGLOBIN AND HEMATOCRIT, BLOOD
HCT: 40.2 % (ref 36.0–46.0)
Hemoglobin: 12.5 g/dL (ref 12.0–15.0)

## 2019-06-02 SURGERY — NEPHRECTOMY, PARTIAL, ROBOT-ASSISTED
Anesthesia: General | Laterality: Left

## 2019-06-02 MED ORDER — ACETAMINOPHEN 10 MG/ML IV SOLN
INTRAVENOUS | Status: AC
  Filled 2019-06-02: qty 100

## 2019-06-02 MED ORDER — HYDROMORPHONE HCL 1 MG/ML IJ SOLN
INTRAMUSCULAR | Status: AC
  Filled 2019-06-02: qty 1

## 2019-06-02 MED ORDER — LACTATED RINGERS IV SOLN: INTRAVENOUS | Status: DC

## 2019-06-02 MED ORDER — MAGNESIUM CITRATE PO SOLN: 1.0000 | Freq: Once | ORAL | Status: DC

## 2019-06-02 MED ORDER — STERILE WATER FOR IRRIGATION IR SOLN
Status: DC | PRN
  Administered 2019-06-02: 1000 mL

## 2019-06-02 MED ORDER — CHLORHEXIDINE GLUCONATE CLOTH 2 % EX PADS
6.0000 | MEDICATED_PAD | Freq: Every day | CUTANEOUS | Status: DC
  Administered 2019-06-03: 6 via TOPICAL

## 2019-06-02 MED ORDER — GENTAMICIN SULFATE 40 MG/ML IJ SOLN
5.0000 mg/kg | INTRAVENOUS | Status: AC
  Administered 2019-06-03: 320 mg via INTRAVENOUS
  Filled 2019-06-02: qty 8

## 2019-06-02 MED ORDER — PANTOPRAZOLE SODIUM 40 MG PO TBEC
80.0000 mg | DELAYED_RELEASE_TABLET | Freq: Every day | ORAL | Status: DC
  Administered 2019-06-02 – 2019-06-05 (×4): 80 mg via ORAL
  Filled 2019-06-02 (×4): qty 2

## 2019-06-02 MED ORDER — FENTANYL CITRATE (PF) 250 MCG/5ML IJ SOLN
INTRAMUSCULAR | Status: AC
  Filled 2019-06-02: qty 5

## 2019-06-02 MED ORDER — OXYCODONE HCL 5 MG/5ML PO SOLN: 5.0000 mg | Freq: Once | ORAL | Status: DC | PRN

## 2019-06-02 MED ORDER — DIPHENHYDRAMINE HCL 50 MG/ML IJ SOLN: 12.5000 mg | Freq: Four times a day (QID) | INTRAMUSCULAR | Status: DC | PRN

## 2019-06-02 MED ORDER — BELLADONNA ALKALOIDS-OPIUM 16.2-60 MG RE SUPP: 1.0000 | Freq: Four times a day (QID) | RECTAL | Status: DC | PRN

## 2019-06-02 MED ORDER — ONDANSETRON HCL 4 MG/2ML IJ SOLN
4.0000 mg | INTRAMUSCULAR | Status: DC | PRN
  Administered 2019-06-04: 4 mg via INTRAVENOUS
  Filled 2019-06-02: qty 2

## 2019-06-02 MED ORDER — PHENYLEPHRINE 40 MCG/ML (10ML) SYRINGE FOR IV PUSH (FOR BLOOD PRESSURE SUPPORT)
PREFILLED_SYRINGE | INTRAVENOUS | Status: DC | PRN
  Administered 2019-06-02 (×2): 80 ug via INTRAVENOUS

## 2019-06-02 MED ORDER — ROSUVASTATIN CALCIUM 10 MG PO TABS
10.0000 mg | ORAL_TABLET | Freq: Every day | ORAL | Status: DC
  Administered 2019-06-02 – 2019-06-04 (×3): 10 mg via ORAL
  Filled 2019-06-02 (×3): qty 1

## 2019-06-02 MED ORDER — FENTANYL CITRATE (PF) 100 MCG/2ML IJ SOLN
INTRAMUSCULAR | Status: DC | PRN
  Administered 2019-06-02: 100 ug via INTRAVENOUS
  Administered 2019-06-02 (×5): 50 ug via INTRAVENOUS

## 2019-06-02 MED ORDER — LOSARTAN POTASSIUM 50 MG PO TABS
50.0000 mg | ORAL_TABLET | Freq: Every day | ORAL | Status: DC
  Administered 2019-06-02 – 2019-06-05 (×4): 50 mg via ORAL
  Filled 2019-06-02 (×4): qty 1

## 2019-06-02 MED ORDER — ROCURONIUM BROMIDE 10 MG/ML (PF) SYRINGE
PREFILLED_SYRINGE | INTRAVENOUS | Status: AC
  Filled 2019-06-02: qty 10

## 2019-06-02 MED ORDER — DEXAMETHASONE SODIUM PHOSPHATE 10 MG/ML IJ SOLN
INTRAMUSCULAR | Status: DC | PRN
  Administered 2019-06-02: 4 mg via INTRAVENOUS

## 2019-06-02 MED ORDER — ONDANSETRON HCL 4 MG/2ML IJ SOLN
INTRAMUSCULAR | Status: AC
  Filled 2019-06-02: qty 2

## 2019-06-02 MED ORDER — FENTANYL CITRATE (PF) 100 MCG/2ML IJ SOLN
INTRAMUSCULAR | Status: AC
  Filled 2019-06-02: qty 2

## 2019-06-02 MED ORDER — ROCURONIUM BROMIDE 50 MG/5ML IV SOSY
PREFILLED_SYRINGE | INTRAVENOUS | Status: DC | PRN
  Administered 2019-06-02: 20 mg via INTRAVENOUS
  Administered 2019-06-02: 55 mg via INTRAVENOUS

## 2019-06-02 MED ORDER — ACETAMINOPHEN 10 MG/ML IV SOLN
1000.0000 mg | Freq: Once | INTRAVENOUS | Status: AC
  Administered 2019-06-02: 1000 mg via INTRAVENOUS

## 2019-06-02 MED ORDER — LIDOCAINE 2% (20 MG/ML) 5 ML SYRINGE
INTRAMUSCULAR | Status: DC | PRN
  Administered 2019-06-02: 80 mg via INTRAVENOUS

## 2019-06-02 MED ORDER — SODIUM CHLORIDE 0.45 % IV SOLN: INTRAVENOUS | Status: DC

## 2019-06-02 MED ORDER — DIPHENHYDRAMINE HCL 12.5 MG/5ML PO ELIX: 12.5000 mg | ORAL_SOLUTION | Freq: Four times a day (QID) | ORAL | Status: DC | PRN

## 2019-06-02 MED ORDER — LIDOCAINE 2% (20 MG/ML) 5 ML SYRINGE
INTRAMUSCULAR | Status: AC
  Filled 2019-06-02: qty 5

## 2019-06-02 MED ORDER — ONDANSETRON HCL 4 MG/2ML IJ SOLN
INTRAMUSCULAR | Status: DC | PRN
  Administered 2019-06-02: 4 mg via INTRAVENOUS

## 2019-06-02 MED ORDER — FENTANYL CITRATE (PF) 100 MCG/2ML IJ SOLN
INTRAMUSCULAR | Status: AC
  Filled 2019-06-02: qty 4

## 2019-06-02 MED ORDER — BUPIVACAINE LIPOSOME 1.3 % IJ SUSP
20.0000 mL | Freq: Once | INTRAMUSCULAR | Status: AC
  Administered 2019-06-02: 20 mL
  Filled 2019-06-02: qty 20

## 2019-06-02 MED ORDER — DEXAMETHASONE SODIUM PHOSPHATE 10 MG/ML IJ SOLN
INTRAMUSCULAR | Status: AC
  Filled 2019-06-02: qty 1

## 2019-06-02 MED ORDER — DOCUSATE SODIUM 100 MG PO CAPS
100.0000 mg | ORAL_CAPSULE | Freq: Two times a day (BID) | ORAL | Status: DC
  Administered 2019-06-02 – 2019-06-05 (×5): 100 mg via ORAL
  Filled 2019-06-02 (×7): qty 1

## 2019-06-02 MED ORDER — ONDANSETRON HCL 4 MG/2ML IJ SOLN: 4.0000 mg | Freq: Once | INTRAMUSCULAR | Status: DC | PRN

## 2019-06-02 MED ORDER — SODIUM CHLORIDE (PF) 0.9 % IJ SOLN
INTRAMUSCULAR | Status: AC
  Filled 2019-06-02: qty 20

## 2019-06-02 MED ORDER — SUGAMMADEX SODIUM 200 MG/2ML IV SOLN
INTRAVENOUS | Status: DC | PRN
  Administered 2019-06-02: 200 mg via INTRAVENOUS

## 2019-06-02 MED ORDER — FENTANYL CITRATE (PF) 100 MCG/2ML IJ SOLN
25.0000 ug | INTRAMUSCULAR | Status: DC | PRN
  Administered 2019-06-02 (×3): 50 ug via INTRAVENOUS

## 2019-06-02 MED ORDER — PROPOFOL 10 MG/ML IV BOLUS
INTRAVENOUS | Status: AC
  Filled 2019-06-02: qty 20

## 2019-06-02 MED ORDER — HYDROMORPHONE HCL 1 MG/ML IJ SOLN
0.2500 mg | INTRAMUSCULAR | Status: DC | PRN
  Administered 2019-06-02 (×4): 0.5 mg via INTRAVENOUS

## 2019-06-02 MED ORDER — OXYCODONE HCL 5 MG PO TABS: 5.0000 mg | ORAL_TABLET | Freq: Once | ORAL | Status: DC | PRN

## 2019-06-02 MED ORDER — HYDROMORPHONE HCL 1 MG/ML IJ SOLN
0.5000 mg | INTRAMUSCULAR | Status: DC | PRN
  Administered 2019-06-02 – 2019-06-04 (×10): 1 mg via INTRAVENOUS
  Filled 2019-06-02 (×13): qty 1

## 2019-06-02 MED ORDER — LIDOCAINE 5 % EX PTCH
2.0000 | MEDICATED_PATCH | CUTANEOUS | Status: DC
  Administered 2019-06-02 – 2019-06-04 (×3): 2 via TRANSDERMAL
  Filled 2019-06-02 (×4): qty 2

## 2019-06-02 MED ORDER — OXYCODONE HCL 5 MG PO TABS
5.0000 mg | ORAL_TABLET | ORAL | Status: DC | PRN
  Administered 2019-06-03 – 2019-06-05 (×9): 5 mg via ORAL
  Filled 2019-06-02 (×9): qty 1

## 2019-06-02 MED ORDER — INSULIN ASPART 100 UNIT/ML ~~LOC~~ SOLN
0.0000 [IU] | Freq: Three times a day (TID) | SUBCUTANEOUS | Status: DC
  Administered 2019-06-02 – 2019-06-03 (×3): 2 [IU] via SUBCUTANEOUS
  Administered 2019-06-03: 3 [IU] via SUBCUTANEOUS
  Administered 2019-06-03 – 2019-06-04 (×2): 2 [IU] via SUBCUTANEOUS
  Administered 2019-06-04: 3 [IU] via SUBCUTANEOUS
  Administered 2019-06-05 (×2): 2 [IU] via SUBCUTANEOUS

## 2019-06-02 MED ORDER — ACETAMINOPHEN 325 MG PO TABS
650.0000 mg | ORAL_TABLET | ORAL | Status: DC | PRN
  Administered 2019-06-03 – 2019-06-05 (×4): 650 mg via ORAL
  Filled 2019-06-02 (×4): qty 2

## 2019-06-02 MED ORDER — LACTATED RINGERS IR SOLN
Status: DC | PRN
  Administered 2019-06-02: 1000 mL

## 2019-06-02 MED ORDER — SODIUM CHLORIDE (PF) 0.9 % IJ SOLN
INTRAMUSCULAR | Status: DC | PRN
  Administered 2019-06-02: 20 mL

## 2019-06-02 MED ORDER — HYDROCODONE-ACETAMINOPHEN 5-325 MG PO TABS: 1.0000 | ORAL_TABLET | Freq: Four times a day (QID) | ORAL | 0 refills | Status: DC | PRN

## 2019-06-02 MED ORDER — PROPOFOL 10 MG/ML IV BOLUS
INTRAVENOUS | Status: DC | PRN
  Administered 2019-06-02: 100 mg via INTRAVENOUS

## 2019-06-02 SURGICAL SUPPLY — 69 items
APPLICATOR SURGIFLO ENDO (HEMOSTASIS) IMPLANT
BAG LAPAROSCOPIC 12 15 PORT 16 (BASKET) ×1 IMPLANT
BAG RETRIEVAL 12/15 (BASKET) ×2
CHLORAPREP W/TINT 26 (MISCELLANEOUS) ×2 IMPLANT
CLIP SUT LAPRA TY ABSORB (SUTURE) IMPLANT
CLIP VESOLOCK LG 6/CT PURPLE (CLIP) ×2 IMPLANT
CLIP VESOLOCK MED LG 6/CT (CLIP) ×2 IMPLANT
CLIP VESOLOCK XL 6/CT (CLIP) ×2 IMPLANT
COVER SURGICAL LIGHT HANDLE (MISCELLANEOUS) ×2 IMPLANT
COVER TIP SHEARS 8 DVNC (MISCELLANEOUS) ×1 IMPLANT
COVER TIP SHEARS 8MM DA VINCI (MISCELLANEOUS) ×1
COVER WAND RF STERILE (DRAPES) IMPLANT
CUTTER ECHEON FLEX ENDO 45 340 (ENDOMECHANICALS) ×2 IMPLANT
DECANTER SPIKE VIAL GLASS SM (MISCELLANEOUS) ×2 IMPLANT
DERMABOND ADVANCED (GAUZE/BANDAGES/DRESSINGS) ×1
DERMABOND ADVANCED .7 DNX12 (GAUZE/BANDAGES/DRESSINGS) ×1 IMPLANT
DRAIN CHANNEL 15F RND FF 3/16 (WOUND CARE) IMPLANT
DRAPE ARM DVNC X/XI (DISPOSABLE) ×4 IMPLANT
DRAPE COLUMN DVNC XI (DISPOSABLE) ×1 IMPLANT
DRAPE DA VINCI XI ARM (DISPOSABLE) ×4
DRAPE DA VINCI XI COLUMN (DISPOSABLE) ×1
DRAPE INCISE IOBAN 66X45 STRL (DRAPES) ×2 IMPLANT
DRAPE SHEET LG 3/4 BI-LAMINATE (DRAPES) ×2 IMPLANT
DRSG TEGADERM 4X4.75 (GAUZE/BANDAGES/DRESSINGS) ×2 IMPLANT
ELECT REM PT RETURN 15FT ADLT (MISCELLANEOUS) ×2 IMPLANT
EVACUATOR SILICONE 100CC (DRAIN) IMPLANT
GLOVE BIO SURGEON STRL SZ 6.5 (GLOVE) ×2 IMPLANT
GLOVE BIOGEL M 8.0 STRL (GLOVE) ×4 IMPLANT
GOWN STRL REUS W/TWL LRG LVL3 (GOWN DISPOSABLE) ×4 IMPLANT
HEMOSTAT SURGICEL 4X8 (HEMOSTASIS) IMPLANT
IRRIG SUCT STRYKERFLOW 2 WTIP (MISCELLANEOUS)
IRRIGATION SUCT STRKRFLW 2 WTP (MISCELLANEOUS) IMPLANT
KIT BASIN OR (CUSTOM PROCEDURE TRAY) ×2 IMPLANT
KIT TURNOVER KIT A (KITS) IMPLANT
LOOP VESSEL MAXI BLUE (MISCELLANEOUS) IMPLANT
MARKER SKIN DUAL TIP RULER LAB (MISCELLANEOUS) ×2 IMPLANT
NEEDLE INSUFFLATION 14GA 120MM (NEEDLE) ×2 IMPLANT
NS IRRIG 1000ML POUR BTL (IV SOLUTION) ×2 IMPLANT
PENCIL SMOKE EVACUATOR (MISCELLANEOUS) IMPLANT
POUCH SPECIMEN RETRIEVAL 10MM (ENDOMECHANICALS) IMPLANT
PROTECTOR NERVE ULNAR (MISCELLANEOUS) ×6 IMPLANT
RELOAD STAPLER WHITE 60MM (STAPLE) IMPLANT
SEAL CANN UNIV 5-8 DVNC XI (MISCELLANEOUS) ×4 IMPLANT
SEAL XI 5MM-8MM UNIVERSAL (MISCELLANEOUS) ×4
SET TUBE SMOKE EVAC HIGH FLOW (TUBING) ×2 IMPLANT
SOLUTION ELECTROLUBE (MISCELLANEOUS) ×2 IMPLANT
SPONGE LAP 4X18 RFD (DISPOSABLE) ×2 IMPLANT
STAPLE ECHEON FLEX 60 POW ENDO (STAPLE) IMPLANT
STAPLE RELOAD 45 WHT (STAPLE) ×2 IMPLANT
STAPLE RELOAD 45MM WHITE (STAPLE) ×2
STAPLER RELOAD WHITE 60MM (STAPLE)
SURGIFLO W/THROMBIN 8M KIT (HEMOSTASIS) IMPLANT
SUT ETHILON 3 0 PS 1 (SUTURE) ×2 IMPLANT
SUT MNCRL AB 4-0 PS2 18 (SUTURE) ×4 IMPLANT
SUT PDS AB 1 TP1 96 (SUTURE) ×4 IMPLANT
SUT VIC AB 0 CT1 27 (SUTURE)
SUT VIC AB 0 CT1 27XBRD ANTBC (SUTURE) IMPLANT
SUT VIC AB 2-0 SH 27 (SUTURE) ×1
SUT VIC AB 2-0 SH 27X BRD (SUTURE) ×1 IMPLANT
SUT VLOC BARB 180 ABS3/0GR12 (SUTURE)
SUTURE VLOC BRB 180 ABS3/0GR12 (SUTURE) IMPLANT
TOWEL OR 17X26 10 PK STRL BLUE (TOWEL DISPOSABLE) IMPLANT
TOWEL OR NON WOVEN STRL DISP B (DISPOSABLE) ×2 IMPLANT
TRAY FOLEY MTR SLVR 16FR STAT (SET/KITS/TRAYS/PACK) ×2 IMPLANT
TRAY LAPAROSCOPIC (CUSTOM PROCEDURE TRAY) ×2 IMPLANT
TROCAR BLADELESS OPT 5 100 (ENDOMECHANICALS) IMPLANT
TROCAR UNIVERSAL OPT 12M 100M (ENDOMECHANICALS) IMPLANT
TROCAR XCEL 12X100 BLDLESS (ENDOMECHANICALS) ×2 IMPLANT
WATER STERILE IRR 1000ML POUR (IV SOLUTION) ×2 IMPLANT

## 2019-06-02 NOTE — Anesthesia Postprocedure Evaluation (Signed)
Anesthesia Post Note  Patient: Kimberly Huff  Procedure(s) Performed: XI ROBOTIC ASSITED NEPHRECTOMY (Left )     Patient location during evaluation: PACU Anesthesia Type: General Level of consciousness: awake and alert Pain management: pain level controlled Vital Signs Assessment: post-procedure vital signs reviewed and stable Respiratory status: spontaneous breathing, nonlabored ventilation and respiratory function stable Cardiovascular status: blood pressure returned to baseline and stable Postop Assessment: no apparent nausea or vomiting Anesthetic complications: no    Last Vitals:  Vitals:   06/02/19 1147 06/02/19 1405  BP: 132/76 130/77  Pulse: 73 74  Resp: 16 18  Temp: (!) 36.4 C 36.7 C  SpO2: 97% 96%    Last Pain:  Vitals:   06/02/19 1405  TempSrc: Oral  PainSc:                  Audry Pili

## 2019-06-02 NOTE — Anesthesia Procedure Notes (Signed)
Procedure Name: Intubation Date/Time: 06/02/2019 7:49 AM Performed by: Montel Clock, CRNA Pre-anesthesia Checklist: Patient identified, Emergency Drugs available, Suction available, Patient being monitored and Timeout performed Patient Re-evaluated:Patient Re-evaluated prior to induction Oxygen Delivery Method: Circle system utilized Preoxygenation: Pre-oxygenation with 100% oxygen Induction Type: IV induction Ventilation: Mask ventilation without difficulty and Oral airway inserted - appropriate to patient size Laryngoscope Size: Mac and 3 Grade View: Grade I Tube type: Oral Tube size: 7.0 mm Number of attempts: 1 Airway Equipment and Method: Stylet Placement Confirmation: ETT inserted through vocal cords under direct vision,  positive ETCO2 and breath sounds checked- equal and bilateral Secured at: 21 cm Tube secured with: Tape Dental Injury: Teeth and Oropharynx as per pre-operative assessment

## 2019-06-02 NOTE — Transfer of Care (Signed)
Immediate Anesthesia Transfer of Care Note  Patient: IVERY MICHALSKI  Procedure(s) Performed: XI ROBOTIC ASSITED NEPHRECTOMY (Left )  Patient Location: PACU  Anesthesia Type:General  Level of Consciousness: drowsy  Airway & Oxygen Therapy: Patient Spontanous Breathing and Patient connected to face mask oxygen  Post-op Assessment: Report given to RN and Post -op Vital signs reviewed and stable  Post vital signs: Reviewed and stable  Last Vitals:  Vitals Value Taken Time  BP 145/85 06/02/19 0955  Temp    Pulse 77 06/02/19 0958  Resp 11 06/02/19 0957  SpO2 100 % 06/02/19 0958  Vitals shown include unvalidated device data.  Last Pain:  Vitals:   06/02/19 0539  TempSrc: Oral         Complications: No apparent anesthesia complications

## 2019-06-02 NOTE — H&P (Signed)
Urology Admission H&P  Chief Complaint: left nonfunctioning kidney  History of Present Illness: Kimberly Huff is a 74yo with a hx of a left nonfunctioning kidney and recurrent E coli UTIs. She has had E coli UTI for the past 2 years every 1-2 months. No flank pain. No LUTS.  Past Medical History:  Diagnosis Date  . Allergy   . Atrophic kidney    left  . Blood transfusion   . Blood transfusion without reported diagnosis   . Cataract   . Clotting disorder (HCC)    blood clots in abdomen after surgery  . Coronary artery disease   . Coronary atherosclerosis   . Diabetes mellitus   . Diverticulosis 2005   Dr Jamal Collin  . External hemorrhoids without mention of complication 9892  . GERD (gastroesophageal reflux disease)   . H/O cardiac catheterization 2005  . Heart murmur 2012   found by Dr. Jamal Collin  . Hyperlipidemia   . Hypertension   . Hypopotassemia   . Kidney stone   . Myocardial infarction ([place]) 2000  . Neuromuscular disorder ([place])    "achy muscles"  . Obesity   . Osteoarthritis   . Recurrent UTI   . Spinal stenosis of cervical region   . Steatohepatitis 05/30/10   Brunt Stage 3, non alcoholic cirrhosis of the liver  . Type II or unspecified type diabetes mellitus without mention of complication, uncontrolled    Patient does takes Metformin and recommended to stop for 48 hours.  Marland Kitchen Ulcer   . Unspecified essential hypertension 2014   Past Surgical History:  Procedure Laterality Date  . ABDOMINAL HYSTERECTOMY  1982   complete  . BREAST CYST ASPIRATION Right 1990's   benign  . BREAST MASS EXCISION     benign  . CARDIAC CATHETERIZATION  2000   cardiac stent  . cardiac stents  2000  . CATARACT EXTRACTION     both eyes  . COLONOSCOPY  2004, 2015   Dr. Jamal Collin, Dr. Olevia Perches  . CYSTOSCOPY W/ RETROGRADES Left 11/12/2015   Procedure: CYSTOSCOPY WITH RETROGRADE PYELOGRAM;  Surgeon: Cleon Gustin, MD;  Location: [facility];  Service: Urology;  Laterality: Left;  . CYSTOSCOPY W/  URETERAL STENT PLACEMENT Left 11/12/2015   Procedure: CYSTOSCOPY WITH STENT REPLACEMENT;  Surgeon: Cleon Gustin, MD;  Location: [facility];  Service: Urology;  Laterality: Left;  . CYSTOSCOPY WITH URETEROSCOPY AND STENT PLACEMENT Left 10/15/2015   Procedure: CYSTOSCOPY WITH URETEROSCOPY AND STENT PLACEMENT;  Surgeon: Cleon Gustin, MD;  Location: [facility];  Service: Urology;  Laterality: Left;  . EYE SURGERY Bilateral 2012   cataract  . FOOT SURGERY  2008  . PTCA  2000  . SALPINGOOPHORECTOMY    . TONSILLECTOMY    . UPPER GI ENDOSCOPY    . URETEROSCOPY WITH HOLMIUM LASER LITHOTRIPSY Left 11/12/2015   Procedure: URETEROSCOPY WITH HOLMIUM LASER LITHOTRIPSY;  Surgeon: Cleon Gustin, MD;  Location: [facility];  Service: Urology;  Laterality: Left;  . URETEROTOMY  1960/1969   x 2 during childhood  . VAGINAL HYSTERECTOMY     PT DENIES ABD HYSTERECTOMY    Home Medications:  Current Facility-Administered Medications  Medication Dose Route Frequency Provider Last Rate Last Admin  . bupivacaine liposome (EXPAREL) 1.3 % injection 266 mg  20 mL Infiltration Once Cleon Gustin, MD      . gentamicin (GARAMYCIN) 320 mg in dextrose 5 % 100 mL IVPB  5 mg/kg (Adjusted) Intravenous 30 min Pre-Op Loida Calamia, Candee Furbish, MD      .  lactated ringers infusion   Intravenous Continuous Audry Pili, MD 50 mL/hr at 06/02/19 0610 New Bag at 06/02/19 0610   Allergies: No Known Allergies  Family History  Problem Relation Age of Onset  . Lung cancer Father   . Heart disease Father   . Diverticulosis Mother   . Diabetes Mother   . Stroke Mother 64  . Autoimmune disease Daughter   . Thyroid disease Daughter   . Thyroid disease Sister   . Stomach cancer Maternal Aunt   . Breast cancer Maternal Aunt   . Colon cancer Neg Hx   . Esophageal cancer Neg Hx   . Rectal cancer Neg Hx    Social History:  reports that she quit smoking about 20 years ago. Her smoking use included cigarettes. She has  never used smokeless tobacco. She reports current alcohol use. She reports that she does not use drugs.  Review of Systems  All other systems reviewed and are negative.   Physical Exam:  Vital signs in last 24 hours: Temp:  [98.5 F (36.9 C)] 98.5 F (36.9 C) (01/29 0539) Pulse Rate:  [88] 88 (01/29 0539) Resp:  [18] 18 (01/29 0539) BP: (150)/(66) 150/66 (01/29 0539) SpO2:  [100 %] 100 % (01/29 0539) Physical Exam  Constitutional: She is oriented to person, place, and time. She appears well-developed and well-nourished.  HENT:  Head: Normocephalic and atraumatic.  Eyes: Pupils are equal, round, and reactive to light. EOM are normal.  Neck: No thyromegaly present.  Cardiovascular: Normal rate and regular rhythm.  Respiratory: Effort normal. No respiratory distress.  GI: Soft. She exhibits no distension.  Musculoskeletal:        General: No edema. Normal range of motion.     Cervical back: Normal range of motion.  Neurological: She is alert and oriented to person, place, and time.  Skin: Skin is warm and dry.  Psychiatric: She has a normal mood and affect. Her behavior is normal. Judgment and thought content normal.    Laboratory Data:  No results found for this or any previous visit (from the past 24 hour(s)). Recent Results (from the past 240 hour(s))  SARS CORONAVIRUS 2 (TAT 6-24 HRS) Nasopharyngeal Nasopharyngeal Swab     Status: None   Collection Time: 05/30/19  8:52 AM   Specimen: Nasopharyngeal Swab  Result Value Ref Range Status   SARS Coronavirus 2 NEGATIVE NEGATIVE Final    Comment: (NOTE) SARS-CoV-2 target nucleic acids are NOT DETECTED. The SARS-CoV-2 RNA is generally detectable in upper and lower respiratory specimens during the acute phase of infection. Negative results do not preclude SARS-CoV-2 infection, do not rule out co-infections with other pathogens, and should not be used as the sole basis for treatment or other patient management  decisions. Negative results must be combined with clinical observations, patient history, and epidemiological information. The expected result is Negative. Fact Sheet for Patients: SugarRoll.be Fact Sheet for Healthcare Providers: https://www.woods-mathews.com/ This test is not yet approved or cleared by the [place] FDA and  has been authorized for detection and/or diagnosis of SARS-CoV-2 by FDA under an Emergency Use Authorization (EUA). This EUA will remain  in effect (meaning this test can be used) for the duration of the COVID-19 declaration under Section 56 4(b)(1) of the Act, 21 U.S.C. section 360bbb-3(b)(1), unless the authorization is terminated or revoked sooner. Performed at [facility], [address], [place], [place] [address]    Creatinine: Recent Labs    05/30/19 0815  CREATININE  0.82   Baseline Creatinine: 0.8  Impression/Assessment:  73yo with a hx of a left chronically infected nonfunctioning kidney  Plan:  The risks/benefits/alterantives to left robotic simple nephrectomy was explained to the patient and she understands and wishes to proceed with surgery  Nicolette Bang 06/02/2019, 7:36 AM

## 2019-06-02 NOTE — Progress Notes (Addendum)
Pt tolerated full liquid diet well with no adverse effects. Can progress to regular diet. Pt declines walking deferring it to tomorrow.

## 2019-06-02 NOTE — Discharge Summary (Signed)
[facility] Discharge Summary  Admit date: 06/02/2019   Discharge date and time: 06/05/19   Discharge to: Home  Discharge Service: Urology  Discharge Attending Physician:  McKenzie  Discharge  Diagnoses: Nonfunctioning left kidney OR Procedures: Procedure(s): XI ROBOTIC ASSITED NEPHRECTOMY 06/02/2019   Ancillary Procedures: None   Discharge Day Services: The patient was seen and examined by the Urology team in the morning  prior to discharge.  Vital signs and laboratory values were stable and within normal limits.  The physical exam was benign and unchanged and all surgical wounds were examined.  Discharge instructions were explained and all questions answered.  Subjective  Pain better controlled, only taking oral medication.  Ambulating slowly.  No fevers in the last 24 hours.  Spoke to daughter in detail to update plan of care  Objective No data found. Total I/O In: 400.8 [I.V.:400.8] Out: -   General Appearance:        No acute distress Lungs:                       Normal work of breathing on room air Heart:                                Regular rate and rhythm Abdomen:                         Soft, non-tender, non-distended.  Incisions clean dry and intact with no signs of breakdown.  Slight hematoma near extraction site as well as in left flank.  No sign of infection. Extremities:                      Warm and well perfused   Hospital Course:  74 year old female with history of nonfunctioning left kidney and multiple urinary tract infections.  After discussion of risks and benefits she elected for left nephrectomy.  The patient underwent robotic left simple nephrectomy on 06/02/2019.  Uncomplicated procedure.  The patient tolerated the procedure well, was extubated in the OR, and afterwards was taken to the PACU for routine post-surgical care. When stable the patient was transferred to the floor.     Foley was removed on postoperative day 1 and she subsequently  passed a trial void.  She had significant pain postoperatively requiring IV medications for the first 2 days.  She also had a fever on postoperative day 1 but this resolved and she was afebrile for the 24 hours prior to discharge.  Her pain was eventually able to be controlled with scheduled Tylenol and as needed oxycodone.   She was discharged on postoperative day 3 after she was ambulating, tolerating regular diet, voiding volitionally.  Daughter was informed about plan of care.  She will follow up with [place] for postoperative check scheduled on Friday  Condition at Discharge: Improved  Discharge Medications:  Allergies as of 06/05/2019   No Known Allergies     Medication List    STOP taking these medications   aspirin EC 325 MG tablet   Cinnamon 500 MG capsule   IBU 800 MG tablet Generic drug: ibuprofen   Probiotic Caps     TAKE these medications   amoxicillin-clavulanate 500-125 MG tablet Commonly known as: AUGMENTIN Take 1 tablet by mouth 2 (two) times daily.   fexofenadine 180 MG tablet Commonly known as: ALLEGRA Take 180 mg by mouth daily as  needed for allergies or rhinitis.   furosemide 40 MG tablet Commonly known as: LASIX TAKE ONE TABLET BY MOUTH EVERY DAY What changed: when to take this   glucose blood test strip Commonly known as: ONE TOUCH ULTRA TEST Use as instructed   HYDROcodone-acetaminophen 5-325 MG tablet Commonly known as: NORCO/VICODIN Take 1-2 tablets by mouth every 6 (six) hours as needed for moderate pain. What changed: how much to take   loperamide 2 MG tablet Commonly known as: IMODIUM A-D Take 4 mg by mouth 2 (two) times daily as needed for diarrhea or loose stools.   losartan 50 MG tablet Commonly known as: COZAAR TAKE ONE TABLET EVERY DAY   metFORMIN 850 MG tablet Commonly known as: GLUCOPHAGE TAKE ONE TABLET BY MOUTH TWICE DAILY WITH A MEAL What changed: when to take this   omeprazole 40 MG capsule Commonly known as:  PRILOSEC TAKE 1 CAPSULE BY MOUTH ONCE DAILY   potassium chloride 10 MEQ tablet Commonly known as: KLOR-CON TAKE ONE TABLET EVERY DAY What changed:   when to take this  reasons to take this   rosuvastatin 10 MG tablet Commonly known as: CRESTOR TAKE ONE TABLET EVERY DAY What changed: when to take this   senna-docusate 8.6-50 MG tablet Commonly known as: Senokot-S Take 2 tablets by mouth daily as needed for mild constipation.

## 2019-06-02 NOTE — Discharge Instructions (Signed)

## 2019-06-02 NOTE — Op Note (Signed)
Preoperative diagnosis: Left chronically infected, poorly functioning kidney  Postop diagnosis: Same  Procedure: 1.  Left robot assisted laparoscopic simple nephrectomy  Attending: Nicolette Bang, MD  Assistant: Debbrah Alar, PA  Resident: Tharon Aquas, MD  Anesthesia: General  Estimated blood loss: 50 cc  Drains: 16 French Foley catheter  Specimens: Left simple nephrectomy  Antibiotics: ancef  Findings: 1 renal artery, 1 renal vein. The assistant was utilized for retraction, suction, and deploying the vascular stapler.  Indications: Patient is a 74 year old with a history of a chronically infected poorly functioning left kidney.  After discussing treatment options patient decided to proceed with left robot assisted laparoscopic simple nephrectomy.  Procedure in detail: Prior to procedure consent was obtained. Patient was brought to the operating room and briefing was done sure correct patient, correct procedure, correct site.  General anesthesia was in administered patient was placed in the right lateral decubitus position.  a 16 French catheter was placed. their abdomen and flank was then prepped and draped usual sterile fashion.  A Veress needle was used to obtain pneumoperitoneum.  Once pneumoperitoneum was reestablished to 15 mmHg we then placed a 8 mm camera port lateral to the umbilicus at the latera; edge of rectus.  We then proceeded to place 3 more robotic ports. We then placed an assistant port. We then docked the robot. We dissected along the white line of [place]t.  We then reflected the colon medially.  We then identified the psoas muscle.  Once this was done we traced it down to the iliac vessels and identified the ureter.  Once we identified the gonadal vein and ureter were then traced this to the renal hilum.  The renal vein and renal artery were skeletonized.  We did we identified one renal vein one renal artery.  Using the [employer] power stapler within ligated the renal  artery.  Once this was done we then used a second staple load to ligate the renal vein.  We then used a tissue load to ligate the gonadal vein and the ureter.  Once this was done we then freed the kidney from its lateral and posterior attachments.  We then used a Endo Catch bag to remove the specimen.  Once the specimen was in the Endo Catch bag we then inspected the retroperitoneum and noted no residual bleeding.  We then removed our instruments, undocked the robot, and released the pneumoperitoneum.  We then made a midline incision above the umbilicus to remove the specimen.  Once the specimen was removed we then closed the camera and assistant ports with 0 Vicryl in interrupted fashion.  We then closed the midline incision with looped PDS in a running fashion.  We then closed the overlying skin with 2-0 Vicryl in running fashion.  These skin was then subcuticularly closed with 4-0 Monocryl.  We then placed Dermabond over all the incisions.  This included the procedure which resulted by the patient.  Complications: None  Condition: Stable, x-rayed, transferred to PACU.  Plan: Patient is to be admitted for inpatient stay. The foley catheter will be removed in the morning. They will be started on a clear liquid diet POD#1

## 2019-06-03 LAB — BASIC METABOLIC PANEL
Anion gap: 6 (ref 5–15)
BUN: 11 mg/dL (ref 8–23)
CO2: 26 mmol/L (ref 22–32)
Calcium: 9 mg/dL (ref 8.9–10.3)
Chloride: 104 mmol/L (ref 98–111)
Creatinine, Ser: 0.62 mg/dL (ref 0.44–1.00)
GFR calc Af Amer: >60 mL/min (ref >60)
GFR calc non Af Amer: >60 mL/min (ref >60)
Glucose, Bld: 125 mg/dL — ABNORMAL HIGH (ref 70–99)
Potassium: 4.2 mmol/L (ref 3.5–5.1)
Sodium: 136 mmol/L (ref 135–145)

## 2019-06-03 LAB — HEMOGLOBIN AND HEMATOCRIT, BLOOD
HCT: 35.2 % — ABNORMAL LOW (ref 36.0–46.0)
Hemoglobin: 11 g/dL — ABNORMAL LOW (ref 12.0–15.0)

## 2019-06-03 LAB — GLUCOSE, CAPILLARY
Glucose-Capillary: 127 mg/dL — ABNORMAL HIGH (ref 70–99)
Glucose-Capillary: 151 mg/dL — ABNORMAL HIGH (ref 70–99)
Glucose-Capillary: 133 mg/dL — ABNORMAL HIGH (ref 70–99)
Glucose-Capillary: 128 mg/dL — ABNORMAL HIGH (ref 70–99)

## 2019-06-03 NOTE — Progress Notes (Signed)
1 Day Post-Op Subjective: No acute events overnight.  The patient is resting comfortably in bed this morning, but states that she has needed Dilaudid every 2 hours.  She denies nausea/vomiting and is requesting a regular diet.  Has not ambulated yet.  She has not passed flatus or had a bowel movement.  Objective: Vital signs in last 24 hours: Temp:  [96.8 F (36 C)-99 F (37.2 C)] 99 F (37.2 C) (01/30 0433) Pulse Rate:  [57-84] 67 (01/30 0433) Resp:  [7-25] 20 (01/30 0433) BP: (104-153)/(51-85) 107/58 (01/30 0433) SpO2:  [94 %-100 %] 98 % (01/30 0433) Weight:  [85.4 kg] 85.4 kg (01/29 1147)  Intake/Output from previous day: 01/29 0701 - 01/30 0700 In: 4108.5 [P.O.:1060; I.V.:3048.5] Out: 1670 [Urine:1650; Blood:20]  Intake/Output this shift: No intake/output data recorded.  Physical Exam:  General: Alert and oriented CV: RRR, palpable distal pulses Lungs: CTAB, equal chest rise Abdomen: Soft, NTND, no rebound or guarding Incisions: Clean, dry and intact Gu: Foley in place and draining clear-yellow urine Ext: NT, No erythema  Lab Results: Recent Labs    06/02/19 1009 06/03/19 0434  HGB 12.5 11.0*  HCT 40.2 35.2*   BMET Recent Labs    06/03/19 0434  NA 136  K 4.2  CL 104  CO2 26  GLUCOSE 125*  BUN 11  CREATININE 0.62  CALCIUM 9.0     Studies/Results: No results found.  Assessment/Plan: PO1 s/p left simple nephrectomy  -d/c Foley -ADAT -OOBTC and ambulate -d/c IVF -Encouraged incentive spirometry -Likely home later today   LOS: 1 day   Ellison Hughs, MD [facility] Pager: [phone]  06/03/2019, 9:23 AM

## 2019-06-04 LAB — GLUCOSE, CAPILLARY
Glucose-Capillary: 97 mg/dL (ref 70–99)
Glucose-Capillary: 137 mg/dL — ABNORMAL HIGH (ref 70–99)
Glucose-Capillary: 164 mg/dL — ABNORMAL HIGH (ref 70–99)
Glucose-Capillary: 152 mg/dL — ABNORMAL HIGH (ref 70–99)

## 2019-06-04 LAB — CBC WITH DIFFERENTIAL/PLATELET
Abs Immature Granulocytes: 0.02 10*3/uL (ref 0.00–0.07)
Basophils Absolute: 0 10*3/uL (ref 0.0–0.1)
Basophils Relative: 0 %
Eosinophils Absolute: 0.3 10*3/uL (ref 0.0–0.5)
Eosinophils Relative: 4 %
HCT: 36.4 % (ref 36.0–46.0)
Hemoglobin: 11.5 g/dL — ABNORMAL LOW (ref 12.0–15.0)
Immature Granulocytes: 0 %
Lymphocytes Relative: 34 %
Lymphs Abs: 2.5 10*3/uL (ref 0.7–4.0)
MCH: 27.7 pg (ref 26.0–34.0)
MCHC: 31.6 g/dL (ref 30.0–36.0)
MCV: 87.7 fL (ref 80.0–100.0)
Monocytes Absolute: 0.6 10*3/uL (ref 0.1–1.0)
Monocytes Relative: 7 %
Neutro Abs: 4.1 10*3/uL (ref 1.7–7.7)
Neutrophils Relative %: 55 %
Platelets: 117 10*3/uL — ABNORMAL LOW (ref 150–400)
RBC: 4.15 MIL/uL (ref 3.87–5.11)
RDW: 13.2 % (ref 11.5–15.5)
WBC: 7.5 10*3/uL (ref 4.0–10.5)
nRBC: 0 % (ref 0.0–0.2)

## 2019-06-04 MED ORDER — MENTHOL 3 MG MT LOZG
1.0000 | LOZENGE | OROMUCOSAL | Status: DC | PRN
  Administered 2019-06-04: 3 mg via ORAL
  Filled 2019-06-04: qty 9

## 2019-06-04 NOTE — Progress Notes (Signed)
2 Days Post-Op Subjective: The patient continues to have issues with pain controlled and required multiple injections of Dilaudid throughout the day and night yesterday.  She also reports intermittent episodes of nausea without vomiting.  She did ambulate the halls and states that she is passing flatus, but has not had a bowel movement yet.  She is urinating without difficulty.  Her temperature has undulated over the past shift, but returned to normal after a dose of Tylenol.  She repeatedly states that she does not feel like she is ready to go home yet today.  Objective: Vital signs in last 24 hours: Temp:  [98.5 F (36.9 C)-100.4 F (38 C)] 99.3 F (37.4 C) (01/31 0547) Pulse Rate:  [82-87] 87 (01/31 0547) Resp:  [18-20] 18 (01/31 0547) BP: (132-154)/(60-64) 136/64 (01/31 0547) SpO2:  [92 %-98 %] 98 % (01/31 0547)  Intake/Output from previous day: 01/30 0701 - 01/31 0700 In: 1490.6 [P.O.:240; I.V.:1250.6] Out: 200 [Urine:200]  Intake/Output this shift: No intake/output data recorded.  Physical Exam:  General: Alert and oriented CV: RRR, palpable distal pulses Lungs: CTAB, equal chest rise Abdomen: Soft, NTND, no rebound or guarding Incisions: There is ecchymosis surrounding her extraction incision and the more inferior port incision, but no signs of cellulitis Ext: NT, No erythema  Lab Results: Recent Labs    06/02/19 1009 06/03/19 0434  HGB 12.5 11.0*  HCT 40.2 35.2*   BMET Recent Labs    06/03/19 0434  NA 136  K 4.2  CL 104  CO2 26  GLUCOSE 125*  BUN 11  CREATININE 0.62  CALCIUM 9.0     Studies/Results: No results found.  Assessment/Plan: POD2 s/p left simple nephrectomy  -DC IV Dilaudid and focus on p.o. oxycodone for pain control.  The patient and I had a long discussion about her postoperative pain and that she will likely have at least some degree of discomfort for the next several weeks.  She is still adamant about staying in the hospital least 1  additional day to make sure that her pain level is to her liking -OOBTC and ambulate -Continue IS -CBC pending.  Unless she spikes a fever greater than 101 F, I will defer antibiotic treatment at this time -Will continue to monitor  LOS: 2 days   Ellison Hughs, MD [facility] Pager: [phone]  06/04/2019, 10:15 AM

## 2019-06-05 LAB — GLUCOSE, CAPILLARY
Glucose-Capillary: 128 mg/dL — ABNORMAL HIGH (ref 70–99)
Glucose-Capillary: 141 mg/dL — ABNORMAL HIGH (ref 70–99)

## 2019-06-05 LAB — SURGICAL PATHOLOGY

## 2019-06-05 MED ORDER — SENNOSIDES-DOCUSATE SODIUM 8.6-50 MG PO TABS: 2.0000 | ORAL_TABLET | Freq: Every day | ORAL | 1 refills | Status: AC | PRN

## 2019-06-05 NOTE — Care Management Important Message (Signed)
Important Message  Patient Details IM Letter given to Dessa Phi RN Case Manager to present to the Patient Name: Kimberly Huff MRN: [id number] Date of Birth: 08-10-1945   Medicare Important Message Given:  Yes     Kerin Salen 06/05/2019, 11:28 AM

## 2019-06-05 NOTE — Plan of Care (Signed)
Pt is ready for DC home this afternoon.

## 2019-06-09 DIAGNOSIS — N3021 Other chronic cystitis with hematuria: Secondary | ICD-10-CM | POA: Diagnosis not present

## 2019-06-14 ENCOUNTER — Ambulatory Visit (INDEPENDENT_AMBULATORY_CARE_PROVIDER_SITE_OTHER): Payer: PPO | Admitting: Internal Medicine

## 2019-06-14 ENCOUNTER — Ambulatory Visit (INDEPENDENT_AMBULATORY_CARE_PROVIDER_SITE_OTHER): Payer: PPO

## 2019-06-14 ENCOUNTER — Other Ambulatory Visit: Payer: Self-pay

## 2019-06-14 ENCOUNTER — Encounter: Payer: Self-pay | Admitting: Internal Medicine

## 2019-06-14 VITALS — Ht 63.0 in | Wt 188.0 lb

## 2019-06-14 DIAGNOSIS — E559 Vitamin D deficiency, unspecified: Secondary | ICD-10-CM | POA: Diagnosis not present

## 2019-06-14 DIAGNOSIS — N261 Atrophy of kidney (terminal): Secondary | ICD-10-CM

## 2019-06-14 DIAGNOSIS — E1121 Type 2 diabetes mellitus with diabetic nephropathy: Secondary | ICD-10-CM

## 2019-06-14 DIAGNOSIS — E1169 Type 2 diabetes mellitus with other specified complication: Secondary | ICD-10-CM | POA: Diagnosis not present

## 2019-06-14 DIAGNOSIS — K7581 Nonalcoholic steatohepatitis (NASH): Secondary | ICD-10-CM | POA: Diagnosis not present

## 2019-06-14 DIAGNOSIS — E785 Hyperlipidemia, unspecified: Secondary | ICD-10-CM

## 2019-06-14 DIAGNOSIS — Z0000 Encounter for general adult medical examination without abnormal findings: Secondary | ICD-10-CM | POA: Diagnosis not present

## 2019-06-14 MED ORDER — ERGOCALCIFEROL 1.25 MG (50000 UT) PO CAPS: 50000.0000 [IU] | ORAL_CAPSULE | ORAL | 3 refills | Status: DC

## 2019-06-14 MED ORDER — HYDROCODONE-ACETAMINOPHEN 5-325 MG PO TABS: 1.0000 | ORAL_TABLET | Freq: Four times a day (QID) | ORAL | 0 refills | Status: DC | PRN

## 2019-06-14 NOTE — Assessment & Plan Note (Addendum)
Excellent control again, no longer taking glipizide before dinner due to decreased appetite .  Continue metformin given dx of fatty lliver..  Patient is up to date on  annual eye exam  And has early  Microalbuminuria. She is  tolerating an  ACE/ARB for renal protection and hypertension .   Lab Results  Component Value Date   HGBA1C 6.0 (H) 05/30/2019   Lab Results  Component Value Date   MICROALBUR 3.2 (H) 02/10/2018

## 2019-06-14 NOTE — Assessment & Plan Note (Signed)
Liver enzymes were not checked I house,  Last check was august  .  Limit tylenol to 2000 mg daily unless lfts are elevated

## 2019-06-14 NOTE — Progress Notes (Addendum)
Subjective:   Kimberly Huff is a 74 y.o. female who presents for Medicare Annual (Subsequent) preventive examination.  Review of Systems:  No ROS.  Medicare Wellness Virtual Visit.  Visual/audio telehealth visit, UTA vital signs.  Ht/Wt provided.  See social history for additional risk factors.   Cardiac Risk Factors include: advanced age (>32mn, >>78women);hypertension;diabetes mellitus     Objective:     Vitals: Ht 5' 3"  (1.6 m)   Wt 188 lb (85.3 kg)   BMI 33.30 kg/m   Body mass index is 33.3 kg/m.  Advanced Directives 06/14/2019 06/02/2019 06/02/2019 05/29/2019 06/10/2018 10/29/2015 10/15/2015  Does Patient Have a Medical Advance Directive? No - No No No Yes Yes  Type of Advance Directive - - - - - HCatering manager Does patient want to make changes to medical advance directive? - - - - - No - Patient declined No - Patient declined  Copy of [place]? - - - - - Yes No - copy requested  Would patient like information on creating a medical advance directive? No - Patient declined No - Patient declined - - No - Patient declined No - patient declined information -    Tobacco Social History   Tobacco Use  Smoking Status Former Smoker  . Types: Cigarettes  . Quit date: 09/15/1998  . Years since quitting: 20.7  Smokeless Tobacco Never Used     Counseling given: Not Answered   Clinical Intake:  Pre-visit preparation completed: Yes        Diabetes: Yes(Followed by pcp)  How often do you need to have someone help you when you read instructions, pamphlets, or other written materials from your doctor or pharmacy?: 1 - Never  Interpreter Needed?: No     Past Medical History:  Diagnosis Date  . Allergy   . Atrophic kidney    left  . Blood transfusion   . Blood transfusion without reported diagnosis   . Cataract   . Clotting disorder (HCC)    blood clots in abdomen after surgery  . Coronary  artery disease   . Coronary atherosclerosis   . Diabetes mellitus   . Diverticulosis 2005   Dr SJamal Collin . External hemorrhoids without mention of complication 21779 . GERD (gastroesophageal reflux disease)   . H/O cardiac catheterization 2005  . Heart murmur 2012   found by Dr. SJamal Collin . Hyperlipidemia   . Hypertension   . Hypopotassemia   . Kidney stone   . [place]  . [place]romuscular disorder ([place]    "achy muscles"  . Obesity   . Osteoarthritis   . Recurrent UTI   . Spinal stenosis of cervical region   . Steatohepatitis 05/30/10   Brunt Stage 3, non alcoholic cirrhosis of the liver  . Type II or unspecified type diabetes mellitus without mention of complication, uncontrolled    Patient does takes Metformin and recommended to stop for 48 hours.  .Marland KitchenUlcer   . Unspecified essential hypertension 2014   Past Surgical History:  Procedure Laterality Date  . ABDOMINAL HYSTERECTOMY  1982   complete  . BREAST CYST ASPIRATION Right 1990's   benign  . BREAST MASS EXCISION     benign  . CARDIAC CATHETERIZATION  2000   cardiac stent  . cardiac stents  2000  . CATARACT EXTRACTION     both eyes  . COLONOSCOPY  2004, 2015   Dr.  Sankar, Dr. Olevia Perches  . CYSTOSCOPY W/ RETROGRADES Left 11/12/2015   Procedure: CYSTOSCOPY WITH RETROGRADE PYELOGRAM;  Surgeon: Cleon Gustin, MD;  Location: [facility];  Service: Urology;  Laterality: Left;  . CYSTOSCOPY W/ URETERAL STENT PLACEMENT Left 11/12/2015   Procedure: CYSTOSCOPY WITH STENT REPLACEMENT;  Surgeon: Cleon Gustin, MD;  Location: [facility];  Service: Urology;  Laterality: Left;  . CYSTOSCOPY WITH URETEROSCOPY AND STENT PLACEMENT Left 10/15/2015   Procedure: CYSTOSCOPY WITH URETEROSCOPY AND STENT PLACEMENT;  Surgeon: Cleon Gustin, MD;  Location: [facility];  Service: Urology;  Laterality: Left;  . EYE SURGERY Bilateral 2012   cataract  . FOOT SURGERY  2008  . PTCA  2000  . ROBOTIC ASSITED PARTIAL NEPHRECTOMY  Left 06/02/2019   Procedure: XI ROBOTIC ASSITED NEPHRECTOMY;  Surgeon: Cleon Gustin, MD;  Location: [facility];  Service: Urology;  Laterality: Left;  . SALPINGOOPHORECTOMY    . TONSILLECTOMY    . UPPER GI ENDOSCOPY    . URETEROSCOPY WITH HOLMIUM LASER LITHOTRIPSY Left 11/12/2015   Procedure: URETEROSCOPY WITH HOLMIUM LASER LITHOTRIPSY;  Surgeon: Cleon Gustin, MD;  Location: [facility];  Service: Urology;  Laterality: Left;  . URETEROTOMY  1960/1969   x 2 during childhood  . VAGINAL HYSTERECTOMY     PT DENIES ABD HYSTERECTOMY   Family History  Problem Relation Age of Onset  . Lung cancer Father   . Heart disease Father   . Diverticulosis Mother   . Diabetes Mother   . Stroke Mother 31  . Autoimmune disease Daughter   . Thyroid disease Daughter   . Thyroid disease Sister   . Stomach cancer Maternal Aunt   . Breast cancer Maternal Aunt   . Colon cancer Neg Hx   . Esophageal cancer Neg Hx   . Rectal cancer Neg Hx    Social History   Socioeconomic History  . Marital status: Single    Spouse name: Not on file  . Number of children: 2  . Years of education: Not on file  . Highest education level: Not on file  Occupational History  . 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 Smoking status: Former Smoker    Types: Cigarettes    Quit date: 09/15/1998    Years since quitting: 20.7  . Smokeless tobacco: Never Used  Substance and Sexual Activity  . Alcohol use: Yes    Comment: very rare  . Drug use: No  . Sexual activity: Not Currently  Other Topics Concern  . Not on file  Social History Narrative  . Not on file   Social Determinants of Health   Financial Resource Strain: Low Risk   . Difficulty of Paying Living Expenses: Not hard at all  Food Insecurity: No Food Insecurity  . Worried About Charity fundraiser in the Last Year: Never true  . Ran Out of Food in the Last Year: Never true  Transportation Needs: No  Transportation Needs  . Lack of Transportation (Medical): No  . Lack of Transportation (Non-Medical): No  Physical Activity:   . Days of Exercise per Week: Not on file  . Minutes of Exercise per Session: Not on file  Stress: No Stress Concern Present  . Feeling of Stress : Not at all  Social Connections: Unknown  . Frequency of Communication with Friends and Family: More than three times a week  . Frequency of Social Gatherings with Friends and Family: More than  three times a week  . Attends Religious Services: More than 4 times per year  . Active Member of Clubs or Organizations: Yes  . Attends Archivist Meetings: More than 4 times per year  . Marital Status: Not on file    Outpatient Encounter Medications as of 06/14/2019  Medication Sig  . fexofenadine (ALLEGRA) 180 MG tablet Take 180 mg by mouth daily as needed for allergies or rhinitis.  Marland Kitchen glucose blood (ONE TOUCH ULTRA TEST) test strip Use as instructed  . HYDROcodone-acetaminophen (NORCO/VICODIN) 5-325 MG tablet Take 1-2 tablets by mouth every 6 (six) hours as needed for moderate pain.  Marland Kitchen loperamide (IMODIUM A-D) 2 MG tablet Take 4 mg by mouth 2 (two) times daily as needed for diarrhea or loose stools.  Marland Kitchen losartan (COZAAR) 50 MG tablet TAKE ONE TABLET EVERY DAY (Patient taking differently: Take 50 mg by mouth daily. )  . metFORMIN (GLUCOPHAGE) 850 MG tablet TAKE ONE TABLET BY MOUTH TWICE DAILY WITH A MEAL (Patient taking differently: Take 850 mg by mouth 2 (two) times daily. )  . omeprazole (PRILOSEC) 40 MG capsule TAKE 1 CAPSULE BY MOUTH ONCE DAILY (Patient taking differently: Take 40 mg by mouth daily. )  . potassium chloride (K-DUR) 10 MEQ tablet TAKE ONE TABLET EVERY DAY (Patient taking differently: Take 10 mEq by mouth daily as needed (when taking lasix). )  . rosuvastatin (CRESTOR) 10 MG tablet TAKE ONE TABLET EVERY DAY (Patient taking differently: Take 10 mg by mouth at bedtime. )  . senna-docusate (SENOKOT-S)  8.6-50 MG tablet Take 2 tablets by mouth daily as needed for mild constipation.  . furosemide (LASIX) 40 MG tablet TAKE ONE TABLET BY MOUTH EVERY DAY (Patient taking differently: Take 40 mg by mouth 4 (four) times a week. )  . [DISCONTINUED] amoxicillin-clavulanate (AUGMENTIN) 500-125 MG tablet Take 1 tablet by mouth 2 (two) times daily.   No facility-administered encounter medications on file as of 06/14/2019.    Activities of Daily Living In your present state of health, do you have any difficulty performing the following activities: 06/14/2019 06/02/2019  Hearing? N N  Vision? N N  Difficulty concentrating or making decisions? N N  Walking or climbing stairs? N N  Dressing or bathing? N N  Doing errands, shopping? N N  Preparing Food and eating ? N -  Using the Toilet? N -  In the past six months, have you accidently leaked urine? N -  Do you have problems with loss of bowel control? N -  Managing your Medications? N -  Managing your Finances? N -  Housekeeping or managing your Housekeeping? N -  Some recent data might be hidden    Patient Care Team: [name], MD as PCP - General (Internal Medicine) [name], MD (General Surgery) [name], MD (Internal Medicine)    Assessment:   This is a routine wellness examination for Inari.  Nurse connected with patient 06/14/19 at  9:00 AM EST by a telephone enabled telemedicine application and verified that I am speaking with the correct person using two identifiers. Patient stated full name and DOB. Patient gave permission to continue with virtual visit. Patient's location was at home and Nurse's location was at [place] office.   Patient is alert and oriented x3. Patient denies difficulty focusing or concentrating. 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   Health Maintenance Due: -Cologuard discussed. Patient is considering and will follow up  with her doctor.  -Eye  Exam- patient plans to schedule.  -Hgb A1c- 05/30/19 (6.0) See completed HM at the end of note.   Eye: Visual acuity not assessed. Virtual visit. Followed by their ophthalmologist.  Retinopathy- none reported. Cataracts extracted, bilateral.  Dental: Visits every 6 months.    Hearing: Demonstrates normal hearing during visit.  Safety:  Patient feels safe at home- yes Patient does have smoke detectors at home- yes Patient does wear sunscreen or protective clothing when in direct sunlight - yes Patient does wear seat belt when in a moving vehicle - yes Patient drives- yes Adequate lighting in walkways free from debris- yes Grab bars and handrails used as appropriate- yes Ambulates with an assistive device- no Cell phone on person when ambulating outside of the home- yes  Social: Alcohol intake - yes      Smoking history- former   Smokers in home? none Illicit drug use? none  Medication: Taking as directed and without issues.  Self managed - yes   Covid-19: Precautions and sickness symptoms discussed. Wears mask, social distancing, hand hygiene as appropriate.   Activities of Daily Living Patient denies needing assistance with: household chores, feeding themselves, getting from bed to chair, getting to the toilet, bathing/showering, dressing, managing money, or preparing meals.   Discussed the importance of a healthy diet, water intake and the benefits of aerobic exercise.   Physical activity- active walking at work and home.   Diet:  Low carb Water: good intake  Other Providers Patient Care Team: [name], MD as PCP - General (Internal Medicine) [name], MD (General Surgery) [name], MD (Internal Medicine)  Exercise Activities and Dietary recommendations Current Exercise Habits: Home exercise routine, Type of exercise: walking, Intensity: Mild  Goals      Patient Stated   . Increase physical activity (pt-stated)     Walk for  exercise as tolerated       Fall Risk Fall Risk  06/14/2019 03/27/2019 12/28/2018 06/10/2018 12/24/2016  Falls in the past year? 0 0 0 0 No  Risk for fall due to : - - Medication side effect - -  Follow up Falls evaluation completed Falls evaluation completed - - -   Timed Get Up and Go performed: no, virtual visit  Depression Screen PHQ 2/9 Scores 06/14/2019 06/10/2018 03/14/2018 12/24/2016  PHQ - 2 Score 0 0 0 1  PHQ- 9 Score - - 3 8     Cognitive Function     6CIT Screen 06/14/2019 06/10/2018  What Year? 0 points 0 points  What month? 0 points 0 points  What time? 0 points 0 points  Count back from 20 - 0 points  Months in reverse - 0 points  Repeat phrase - 0 points  Total Score - 0    Immunization History  Administered Date(s) Administered  . Hepatitis B 02/16/2011  . Influenza, High Dose Seasonal PF 03/18/2018, 04/14/2019  . Influenza,inj,Quad PF,6+ Mos 02/22/2013, 03/07/2014  . Influenza-Unspecified 02/05/2015, 02/17/2016  . PFIZER SARS-COV-2 Vaccination 05/09/2019, 05/30/2019  . Pneumococcal Conjugate-13 03/07/2014  . Pneumococcal Polysaccharide-23 03/16/2013, 07/12/2018  . Tdap 02/22/2013   Screening Tests Health Maintenance  Topic Date Due  . Fecal DNA (Cologuard)  08/26/1993  . OPHTHALMOLOGY EXAM  10/02/2017  . FOOT EXAM  07/12/2019  . HEMOGLOBIN A1C  11/27/2019  . TETANUS/TDAP  02/23/2023  . INFLUENZA VACCINE  Completed  . DEXA SCAN  Completed  . Hepatitis C Screening  Completed  . PNA  vac Low Risk Adult  Completed      Plan:   Keep all routine maintenance appointments.   Follow up with your doctor today @ 9:30.  Medicare Attestation I have personally reviewed: The patient's medical and social history Their use of alcohol, tobacco or illicit drugs Their current medications and supplements The patient's functional ability including ADLs,fall risks, home safety risks, cognitive, and hearing and visual impairment Diet and physical activities Evidence  for depression    I have reviewed and discussed with patient certain preventive protocols, quality metrics, and best practice recommendations.      OBrien-Blaney, Sunny Aguon L, LPN  [id number]    I have reviewed the above information and agree with above.   Deborra Medina, MD

## 2019-06-14 NOTE — Patient Instructions (Addendum)
  Ms. Pollitt , Thank you for taking time to come for your Medicare Wellness Visit. I appreciate your ongoing commitment to your health goals. Please review the following plan we discussed and let me know if I can assist you in the future.   These are the goals we discussed: Goals      Patient Stated   . Increase physical activity (pt-stated)     Walk for exercise as tolerated       This is a list of the screening recommended for you and due dates:  Health Maintenance  Topic Date Due  . Cologuard (Stool DNA test)  08/26/1993  . Eye exam for diabetics  10/02/2017  . Complete foot exam   07/12/2019  . Hemoglobin A1C  11/27/2019  . Tetanus Vaccine  02/23/2023  . Flu Shot  Completed  . DEXA scan (bone density measurement)  Completed  .  Hepatitis C: One time screening is recommended by Center for Disease Control  (CDC) for  adults born from [birth year] through 1967.   Completed  . Pneumonia vaccines  Completed

## 2019-06-14 NOTE — Patient Instructions (Addendum)
I have refilled your vicodin for prn use. You'll need to stay off the ibuprofen for another week   I want you to start a WEEKLY vitamin D supplement for one month and have sent it to your pharmacy  Santiago Glad will call you to set up a lab appt for cholesterol and liver function

## 2019-06-14 NOTE — Progress Notes (Signed)
Pt stated that she had her left kidney removed on 06/02/2019.

## 2019-06-14 NOTE — Assessment & Plan Note (Addendum)
S/p nephrectomy left  Jun 02 2019. Recovering well at home..  vicodin refilled for prn use since she is still suspending motrin for back pain and needs to limit tylenol due to fatty liver

## 2019-06-14 NOTE — Progress Notes (Signed)
Telephone  Note  This visit type was conducted due to national recommendations for restrictions regarding the COVID-19 pandemic (e.g. social distancing).  This format is felt to be most appropriate for this patient at this time.  All issues noted in this document were discussed and addressed.  No physical exam was performed (except for noted visual exam findings with Video Visits).   I connected with@ on 06/14/19 at  9:30 AM EST by telephone and verified that I am speaking with the correct person using two identifiers. Location patient: home Location provider: work or home office Persons participating in the virtual visit: patient, provider  I discussed the limitations, risks, security and privacy concerns of performing an evaluation and management service by telephone and the availability of in person appointments. I also discussed with the patient that there may be a patient responsible charge related to this service. The patient expressed understanding and agreed to proceed.  Reason for visit: follow up  HPI:  74 yr old female with Type 2 DM , hypertension  S/p left nephrectomy on Jan 29, released Feb 1 after one day of fevers. Was prescribed one week of augmentin.. had urology follow up last Friday .   Daughter and grandson stayed with her last week.  Taking tylenol 2000 mg daily and one to two hydrocodone daily for post op pain .  Has been unable to stand or more than 5 minutes due to side pain  Just above the left hip and radiates into lower abdomen  But able to do laundry,  Make bed and unload dishwasher. .  Appetite has been poor  Due to mild nausea.  No vomiting. Bowels moving with use of sennakot.  Finished amox since Monday ,  Taking probiotic but  Stools are the consistency of oatmeal currently.    DM: BS reviewed while in house.  No longer taking glipizide.    Had both doses of the COVID 19 vaccine and had watery diarrhea afterward due to the preoperative prep   ROS: See pertinent  positives and negatives per HPI.  Past Medical History:  Diagnosis Date  . Allergy   . Atrophic kidney    left  . Blood transfusion   . Blood transfusion without reported diagnosis   . Cataract   . Clotting disorder (HCC)    blood clots in abdomen after surgery  . Coronary artery disease   . Coronary atherosclerosis   . Diabetes mellitus   . Diverticulosis 2005   Dr Jamal Collin  . External hemorrhoids without mention of complication 2778  . GERD (gastroesophageal reflux disease)   . H/O cardiac catheterization 2005  . Heart murmur 2012   found by Dr. Jamal Collin  . Hyperlipidemia   . Hypertension   . Hypopotassemia   . Kidney stone   . Myocardial infarction ([place]) 2000  . Neuromuscular disorder ([place])    "achy muscles"  . Obesity   . Osteoarthritis   . Recurrent UTI   . Spinal stenosis of cervical region   . Steatohepatitis 05/30/10   Brunt Stage 3, non alcoholic cirrhosis of the liver  . Type II or unspecified type diabetes mellitus without mention of complication, uncontrolled    Patient does takes Metformin and recommended to stop for 48 hours.  Marland Kitchen Ulcer   . Unspecified essential hypertension 2014    Past Surgical History:  Procedure Laterality Date  . ABDOMINAL HYSTERECTOMY  1982   complete  . BREAST CYST ASPIRATION Right 1990's   benign  .  BREAST MASS EXCISION     benign  . CARDIAC CATHETERIZATION  2000   cardiac stent  . cardiac stents  2000  . CATARACT EXTRACTION     both eyes  . COLONOSCOPY  2004, 2015   Dr. Jamal Collin, Dr. Olevia Perches  . CYSTOSCOPY W/ RETROGRADES Left 11/12/2015   Procedure: CYSTOSCOPY WITH RETROGRADE PYELOGRAM;  Surgeon: Cleon Gustin, MD;  Location: [facility];  Service: Urology;  Laterality: Left;  . CYSTOSCOPY W/ URETERAL STENT PLACEMENT Left 11/12/2015   Procedure: CYSTOSCOPY WITH STENT REPLACEMENT;  Surgeon: Cleon Gustin, MD;  Location: [facility];  Service: Urology;  Laterality: Left;  . CYSTOSCOPY WITH URETEROSCOPY AND STENT PLACEMENT Left  10/15/2015   Procedure: CYSTOSCOPY WITH URETEROSCOPY AND STENT PLACEMENT;  Surgeon: Cleon Gustin, MD;  Location: [facility];  Service: Urology;  Laterality: Left;  . EYE SURGERY Bilateral 2012   cataract  . FOOT SURGERY  2008  . PTCA  2000  . ROBOTIC ASSITED PARTIAL NEPHRECTOMY Left 06/02/2019   Procedure: XI ROBOTIC ASSITED NEPHRECTOMY;  Surgeon: Cleon Gustin, MD;  Location: [facility];  Service: Urology;  Laterality: Left;  . SALPINGOOPHORECTOMY    . TONSILLECTOMY    . UPPER GI ENDOSCOPY    . URETEROSCOPY WITH HOLMIUM LASER LITHOTRIPSY Left 11/12/2015   Procedure: URETEROSCOPY WITH HOLMIUM LASER LITHOTRIPSY;  Surgeon: Cleon Gustin, MD;  Location: [facility];  Service: Urology;  Laterality: Left;  . URETEROTOMY  1960/1969   x 2 during childhood  . VAGINAL HYSTERECTOMY     PT DENIES ABD HYSTERECTOMY    Family History  Problem Relation Age of Onset  . Lung cancer Father   . Heart disease Father   . Diverticulosis Mother   . Diabetes Mother   . Stroke Mother 85  . Autoimmune disease Daughter   . Thyroid disease Daughter   . Thyroid disease Sister   . Stomach cancer Maternal Aunt   . Breast cancer Maternal Aunt   . Colon cancer Neg Hx   . Esophageal cancer Neg Hx   . Rectal cancer Neg Hx     SOCIAL HX:  reports that she quit smoking about 20 years ago. Her smoking use included cigarettes. She has never used smokeless tobacco. She reports current alcohol use. She reports that she does not use drugs.   Current Outpatient Medications:  .  fexofenadine (ALLEGRA) 180 MG tablet, Take 180 mg by mouth daily as needed for allergies or rhinitis., Disp: , Rfl:  .  furosemide (LASIX) 40 MG tablet, TAKE ONE TABLET BY MOUTH EVERY DAY (Patient taking differently: Take 40 mg by mouth 4 (four) times a week. ), Disp: 90 tablet, Rfl: 1 .  glucose blood (ONE TOUCH ULTRA TEST) test strip, Use as instructed, Disp: 100 each, Rfl: 12 .  [START ON 06/16/2019] HYDROcodone-acetaminophen  (NORCO/VICODIN) 5-325 MG tablet, Take 1-2 tablets by mouth every 6 (six) hours as needed for moderate pain., Disp: 20 tablet, Rfl: 0 .  loperamide (IMODIUM A-D) 2 MG tablet, Take 4 mg by mouth 2 (two) times daily as needed for diarrhea or loose stools., Disp: , Rfl:  .  losartan (COZAAR) 50 MG tablet, TAKE ONE TABLET EVERY DAY (Patient taking differently: Take 50 mg by mouth daily. ), Disp: 90 tablet, Rfl: 3 .  metFORMIN (GLUCOPHAGE) 850 MG tablet, TAKE ONE TABLET BY MOUTH TWICE DAILY WITH A MEAL (Patient taking differently: Take 850 mg by mouth 2 (two) times daily. ), Disp: 180 tablet, Rfl: 1 .  omeprazole (PRILOSEC) 40 MG capsule, TAKE 1 CAPSULE BY MOUTH ONCE DAILY (Patient taking differently: Take 40 mg by mouth daily. ), Disp: 90 capsule, Rfl: 1 .  potassium chloride (K-DUR) 10 MEQ tablet, TAKE ONE TABLET EVERY DAY (Patient taking differently: Take 10 mEq by mouth daily as needed (when taking lasix). ), Disp: 90 tablet, Rfl: 1 .  rosuvastatin (CRESTOR) 10 MG tablet, TAKE ONE TABLET EVERY DAY (Patient taking differently: Take 10 mg by mouth at bedtime. ), Disp: 90 tablet, Rfl: 1 .  senna-docusate (SENOKOT-S) 8.6-50 MG tablet, Take 2 tablets by mouth daily as needed for mild constipation., Disp: 30 tablet, Rfl: 1 .  ergocalciferol (DRISDOL) 1.25 MG (50000 UT) capsule, Take 1 capsule (50,000 Units total) by mouth once a week., Disp: 4 capsule, Rfl: 3 .  ondansetron (ZOFRAN) 4 MG tablet, Take 4 mg by mouth every 6 (six) hours as needed., Disp: , Rfl:   EXAM:  VITALS per patient if applicable:  GENERAL: alert, oriented, appears well and in no acute distress  HEENT: atraumatic, conjunttiva clear, no obvious abnormalities on inspection of external nose and ears  NECK: normal movements of the head and neck  LUNGS: on inspection no signs of respiratory distress, breathing rate appears normal, no obvious gross SOB, gasping or wheezing  CV: no obvious cyanosis  MS: moves all visible extremities  without noticeable abnormality  PSYCH/NEURO: pleasant and cooperative, no obvious depression or anxiety, speech and thought processing grossly intact  ASSESSMENT AND PLAN:  Discussed the following assessment and plan:  Dyslipidemia associated with type 2 diabetes mellitus ([place]) - Plan: Lipid panel  Atrophic kidney  Controlled type 2 diabetes mellitus with diabetic nephropathy, without long-term current use of insulin ([place]) - Plan: Comprehensive metabolic panel  Vitamin D deficiency - Plan: VITAMIN D 25 Hydroxy (Vit-D Deficiency, Fractures)  NASH (nonalcoholic steatohepatitis)  Atrophic kidney S/p nephrectomy left  Jun 02 2019. Recovering well at home..  vicodin refilled for prn use since she is still suspending motrin for back pain and needs to limit tylenol due to fatty liver   Type 2 diabetes mellitus, controlled, with renal complications (HCC) Excellent control again, no longer taking glipizide before dinner due to decreased appetite .  Continue metformin given dx of fatty lliver..  Patient is up to date on  annual eye exam  And has early  Microalbuminuria. She is  tolerating an  ACE/ARB for renal protection and hypertension .   Lab Results  Component Value Date   HGBA1C 6.0 (H) 05/30/2019   Lab Results  Component Value Date   MICROALBUR 3.2 (H) 02/10/2018         NASH (nonalcoholic steatohepatitis) Liver enzymes were not checked I house,  Last check was august  .  Limit tylenol to 2000 mg daily unless lfts are elevated   Vitamin D deficiency Repeat level needed,  Getting prolia on jan 25     I discussed the assessment and treatment plan with the patient. The patient was provided an opportunity to ask questions and all were answered. The patient agreed with the plan and demonstrated an understanding of the instructions.   The patient was advised to call back or seek an in-person evaluation if the symptoms worsen or if the condition fails to improve as  anticipated.  I provided  25 minutes of non-face-to-face time during this encounter reviewing patient's current problems and post surgeries.  Providing counseling on the above mentioned problems , and coordination  of care . Aris Everts  Derrel Nip, MD

## 2019-06-14 NOTE — Assessment & Plan Note (Addendum)
Repeat level needed,  Getting prolia on jan 25 .  Will prescribe one month of megadose

## 2019-06-29 ENCOUNTER — Ambulatory Visit (INDEPENDENT_AMBULATORY_CARE_PROVIDER_SITE_OTHER): Payer: PPO

## 2019-06-29 ENCOUNTER — Other Ambulatory Visit (INDEPENDENT_AMBULATORY_CARE_PROVIDER_SITE_OTHER): Payer: PPO

## 2019-06-29 ENCOUNTER — Other Ambulatory Visit: Payer: Self-pay

## 2019-06-29 DIAGNOSIS — M810 Age-related osteoporosis without current pathological fracture: Secondary | ICD-10-CM | POA: Diagnosis not present

## 2019-06-29 DIAGNOSIS — E1121 Type 2 diabetes mellitus with diabetic nephropathy: Secondary | ICD-10-CM | POA: Diagnosis not present

## 2019-06-29 DIAGNOSIS — E1129 Type 2 diabetes mellitus with other diabetic kidney complication: Secondary | ICD-10-CM

## 2019-06-29 DIAGNOSIS — E1169 Type 2 diabetes mellitus with other specified complication: Secondary | ICD-10-CM | POA: Diagnosis not present

## 2019-06-29 DIAGNOSIS — E559 Vitamin D deficiency, unspecified: Secondary | ICD-10-CM | POA: Diagnosis not present

## 2019-06-29 DIAGNOSIS — R809 Proteinuria, unspecified: Secondary | ICD-10-CM

## 2019-06-29 DIAGNOSIS — E785 Hyperlipidemia, unspecified: Secondary | ICD-10-CM | POA: Diagnosis not present

## 2019-06-29 LAB — COMPREHENSIVE METABOLIC PANEL
ALT: 17 U/L (ref 0–35)
AST: 15 U/L (ref 0–37)
Albumin: 4.3 g/dL (ref 3.5–5.2)
Alkaline Phosphatase: 54 U/L (ref 39–117)
BUN: 20 mg/dL (ref 6–23)
CO2: 28 mEq/L (ref 19–32)
Calcium: 10.8 mg/dL — ABNORMAL HIGH (ref 8.4–10.5)
Chloride: 100 mEq/L (ref 96–112)
Creatinine, Ser: 0.88 mg/dL (ref 0.40–1.20)
GFR: 62.5 mL/min (ref >60.00)
Glucose, Bld: 130 mg/dL — ABNORMAL HIGH (ref 70–99)
Potassium: 5 mEq/L (ref 3.5–5.1)
Sodium: 139 mEq/L (ref 135–145)
Total Bilirubin: 0.7 mg/dL (ref 0.2–1.2)
Total Protein: 7.2 g/dL (ref 6.0–8.3)

## 2019-06-29 LAB — MICROALBUMIN / CREATININE URINE RATIO
Creatinine,U: 138 mg/dL
Microalb Creat Ratio: 5 mg/g (ref 0.0–30.0)
Microalb, Ur: 6.9 mg/dL — ABNORMAL HIGH (ref 0.0–1.9)

## 2019-06-29 LAB — LDL CHOLESTEROL, DIRECT: Direct LDL: 57 mg/dL

## 2019-06-29 LAB — LIPID PANEL
Cholesterol: 100 mg/dL (ref 0–200)
HDL: 23.6 mg/dL — ABNORMAL LOW (ref >39.00)
NonHDL: 76.27
Total CHOL/HDL Ratio: 4
Triglycerides: 208 mg/dL — ABNORMAL HIGH (ref 0.0–149.0)
VLDL: 41.6 mg/dL — ABNORMAL HIGH (ref 0.0–40.0)

## 2019-06-29 LAB — VITAMIN D 25 HYDROXY (VIT D DEFICIENCY, FRACTURES): VITD: 19.71 ng/mL — ABNORMAL LOW (ref 30.00–100.00)

## 2019-06-29 MED ORDER — DENOSUMAB 60 MG/ML ~~LOC~~ SOSY
60.0000 mg | PREFILLED_SYRINGE | Freq: Once | SUBCUTANEOUS | Status: AC
  Administered 2019-06-29: 60 mg via SUBCUTANEOUS

## 2019-06-29 NOTE — Progress Notes (Signed)
Patient presented for 6 month Prolia injection SQ to left arm. Patient tolerated well.

## 2019-06-30 ENCOUNTER — Other Ambulatory Visit: Payer: Self-pay | Admitting: Internal Medicine

## 2019-06-30 DIAGNOSIS — E559 Vitamin D deficiency, unspecified: Secondary | ICD-10-CM

## 2019-06-30 NOTE — Assessment & Plan Note (Signed)
Continue megadose or 50, 000 Ius weekly in perpetuity  for persistently low level

## 2019-07-10 DIAGNOSIS — R1032 Left lower quadrant pain: Secondary | ICD-10-CM | POA: Diagnosis not present

## 2019-07-10 DIAGNOSIS — M549 Dorsalgia, unspecified: Secondary | ICD-10-CM | POA: Diagnosis not present

## 2019-07-10 DIAGNOSIS — N270 Small kidney, unilateral: Secondary | ICD-10-CM | POA: Diagnosis not present

## 2019-07-10 DIAGNOSIS — N3021 Other chronic cystitis with hematuria: Secondary | ICD-10-CM | POA: Diagnosis not present

## 2019-07-12 ENCOUNTER — Other Ambulatory Visit: Payer: Self-pay | Admitting: Internal Medicine

## 2019-07-12 DIAGNOSIS — E1169 Type 2 diabetes mellitus with other specified complication: Secondary | ICD-10-CM

## 2019-07-12 DIAGNOSIS — E1121 Type 2 diabetes mellitus with diabetic nephropathy: Secondary | ICD-10-CM

## 2019-07-12 DIAGNOSIS — E785 Hyperlipidemia, unspecified: Secondary | ICD-10-CM

## 2019-07-13 NOTE — Telephone Encounter (Signed)
Refill request for K-dur, last seen 06-14-19, last filled 12-28-18.  Please advise.

## 2019-07-13 NOTE — Addendum Note (Signed)
Addended by: Adair Laundry on: 07/13/2019 05:04 PM   Modules accepted: Orders

## 2019-07-13 NOTE — Telephone Encounter (Signed)
Spoke with pt and she stated that she saw on mychart that the potassium was not going to be refilled. I explained to pt why it wasn't going to be refilled. Pt gave a verbal understanding. Scheduled pt for both a 3 month follow up and a lab appt. Pt is aware of appt dates and times.

## 2019-07-21 DIAGNOSIS — N3021 Other chronic cystitis with hematuria: Secondary | ICD-10-CM | POA: Diagnosis not present

## 2019-07-21 DIAGNOSIS — M549 Dorsalgia, unspecified: Secondary | ICD-10-CM | POA: Diagnosis not present

## 2019-07-28 DIAGNOSIS — N2889 Other specified disorders of kidney and ureter: Secondary | ICD-10-CM | POA: Diagnosis not present

## 2019-07-28 DIAGNOSIS — N3021 Other chronic cystitis with hematuria: Secondary | ICD-10-CM | POA: Diagnosis not present

## 2019-08-24 ENCOUNTER — Other Ambulatory Visit: Payer: Self-pay | Admitting: Internal Medicine

## 2019-08-24 DIAGNOSIS — N3021 Other chronic cystitis with hematuria: Secondary | ICD-10-CM | POA: Diagnosis not present

## 2019-08-24 NOTE — Telephone Encounter (Signed)
Patient last LB note from stated- But Your vitamin D is low again.   I am calling in a megadose of Vit D to take once weekly in perpet ... that would be perpettually from now on ? Ok to fill?

## 2019-08-25 ENCOUNTER — Other Ambulatory Visit: Payer: Self-pay | Admitting: Urology

## 2019-09-11 ENCOUNTER — Ambulatory Visit: Payer: PPO | Admitting: Internal Medicine

## 2019-09-12 ENCOUNTER — Ambulatory Visit: Payer: PPO | Admitting: Internal Medicine

## 2019-09-18 ENCOUNTER — Encounter (HOSPITAL_BASED_OUTPATIENT_CLINIC_OR_DEPARTMENT_OTHER): Payer: Self-pay | Admitting: Urology

## 2019-09-18 ENCOUNTER — Other Ambulatory Visit: Payer: Self-pay

## 2019-09-18 NOTE — Progress Notes (Addendum)
Addendum: spoke with jessica zanetto pa ok to proceed.  Spoke w/ via phone for pre-op interview---patient Lab needs dos----I stat 8               COVID test ------09-22-2019 @ [facility] at -------1015 am 09-26-2019 NPO after ------midnight Medications to take morning of surgery -----omeprazole Diabetic medication -----none day of surgery Patient Special Instructions -----none Pre-Op special Istructions -----none Patient verbalized understanding of instructions that were given at this phone interview. Patient denies shortness of breath, chest pain, fever, cough a this phone interview.  Anesthesia : cad (nstemi) stent (09-1998), mi,htn,  dm type 2 , Chart to jessica zanetto pa for review  PCP:dr teresa tullo Cardiologist :dr Lujean Amel lov 10-03-2018 [facility] Chest x-ray :none EKG :05-30-2019 epic Echo :none Cardiac Cath : none Sleep Study/ CPAP :n/a Fasting Blood Sugar :   Dose not check cbg    Blood Thinner/ Instructions /Last Dose: n/a 325 mg aspirin patient has instructions from dr Alyson Ingles to stop 5 days before surgery ASA / Instructions/ Last Dose :  surgeryshortness of breath, chest pain, fever, and cough at this phone interview.

## 2019-09-22 ENCOUNTER — Other Ambulatory Visit
Admission: RE | Admit: 2019-09-22 | Discharge: 2019-09-22 | Disposition: A | Discharge status: Home / Self Care | Payer: PPO | Source: Ambulatory Visit | Attending: Urology | Admitting: Urology

## 2019-09-22 DIAGNOSIS — Z01812 Encounter for preprocedural laboratory examination: Secondary | ICD-10-CM | POA: Insufficient documentation

## 2019-09-22 DIAGNOSIS — Z20822 Contact with and (suspected) exposure to covid-19: Secondary | ICD-10-CM | POA: Insufficient documentation

## 2019-09-22 LAB — SARS CORONAVIRUS 2 (TAT 6-24 HRS): SARS Coronavirus 2: NEGATIVE

## 2019-09-25 NOTE — Progress Notes (Signed)
Anesthesia Chart Review   Case: [id number] Date/Time: 09/26/19 1200   Procedure: CYSTOSCOPY WITH BIOPSY AND FULGURATION (N/A )   Anesthesia type: General   Pre-op diagnosis: bladder lesion   Location: [facility] OR ROOM 2 / [place]   Surgeons: Cleon Gustin, MD       DISCUSSION:74 y.o. former smoker (19 pack years, quit 09/15/98) with h/o HLD, HTN, GERD, DM II, CAD (NSTEMI PCI, stent 09/1998), NASH, s/p left nephrectomy 06/02/2019, bladder lesion scheduled for above procedure 09/26/2019 with Dr. Nicolette Bang.   Last seen by cardiologist, Dr. Clayborn Bigness, 10/03/2018.  Stable at this visit with 1 year follow up recommended.    S/p nephrectomy 06/02/2019 with no anesthesia complications noted.    Discussed with Dr. Fransisco Beau.  Anticipate pt can proceed with planned procedure barring acute status change.   VS: Ht 5' 3"  (1.6 m)   Wt 82.6 kg   BMI 32.24 kg/m   PROVIDERS: [name], MD is PCP last seen 06/14/2019  Jobe Gibbon, MD is Cardiologist  LABS:  labs dos (all labs ordered are listed, but only abnormal results are displayed)  Labs Reviewed - No data to display   IMAGES:   EKG: 05/30/2019 Rate 71 bpm Normal sinus rhythm  Possible inferior infarct, age undetermined Possible anterolateral infarct, age undetermined No significant change since last tracing   CV:  Past Medical History:  Diagnosis Date   Allergy    Atrophic kidney    left   Blood clot in vein 45 yrs ago   inabdoment after surgery   Blood transfusion without reported diagnosis    Cataract    Coronary artery disease    Coronary atherosclerosis    Diabetes mellitus    Diverticulosis 2005   Dr Jamal Collin   Dyspnea    with exertion   External hemorrhoids without mention of complication 1941   GERD (gastroesophageal reflux disease)    H/O cardiac catheterization 2005   Heart murmur 2012   found by Dr. Jamal Collin   Hyperlipidemia    Hypertension    Kidney stone    Lesion of  bladder    Myocardial infarction [facility]) 2000   Neuromuscular disorder ([place])    "achy muscles"   Obesity    Osteoarthritis    Recurrent UTI    Spinal stenosis of cervical region    Steatohepatitis 05/30/10   Brunt Stage 3, non alcoholic cirrhosis of the liver   Type II or unspecified type diabetes mellitus without mention of complication, uncontrolled    Patient does takes Metformin and recommended to stop for 48 hours.   Ulcer    Unspecified essential hypertension 2014    Past Surgical History:  Procedure Laterality Date   ABDOMINAL HYSTERECTOMY  1982   complete   BREAST CYST ASPIRATION Right 1990's   benign   BREAST MASS EXCISION     benign   CARDIAC CATHETERIZATION  2000   cardiac stent   cardiac stents  2000   CATARACT EXTRACTION     both eyes   COLONOSCOPY  2004, 2015   Dr. Jamal Collin, Dr. Olevia Perches   CYSTOSCOPY W/ RETROGRADES Left 11/12/2015   Procedure: CYSTOSCOPY WITH RETROGRADE PYELOGRAM;  Surgeon: Cleon Gustin, MD;  Location: [facility];  Service: Urology;  Laterality: Left;   CYSTOSCOPY W/ URETERAL STENT PLACEMENT Left 11/12/2015   Procedure: CYSTOSCOPY WITH STENT REPLACEMENT;  Surgeon: Cleon Gustin, MD;  Location: [facility];  Service: Urology;  Laterality: Left;   CYSTOSCOPY WITH  URETEROSCOPY AND STENT PLACEMENT Left 10/15/2015   Procedure: CYSTOSCOPY WITH URETEROSCOPY AND STENT PLACEMENT;  Surgeon: Cleon Gustin, MD;  Location: [facility];  Service: Urology;  Laterality: Left;   EYE SURGERY Bilateral 2012   cataract   FOOT SURGERY  2008   PTCA  2000   stent x 1   ROBOTIC ASSITED PARTIAL NEPHRECTOMY Left 06/02/2019   Procedure: XI ROBOTIC ASSITED NEPHRECTOMY;  Surgeon: Cleon Gustin, MD;  Location: [facility];  Service: Urology;  Laterality: Left;   SALPINGOOPHORECTOMY     TONSILLECTOMY     UPPER GI ENDOSCOPY     URETEROSCOPY WITH HOLMIUM LASER LITHOTRIPSY Left 11/12/2015   Procedure: URETEROSCOPY WITH HOLMIUM LASER LITHOTRIPSY;  Surgeon: Cleon Gustin,  MD;  Location: [facility];  Service: Urology;  Laterality: Left;   URETEROTOMY  1960/1969   x 2 during childhood   VAGINAL HYSTERECTOMY      MEDICATIONS: No current facility-administered medications for this encounter.    aspirin 325 MG tablet   Cinnamon 500 MG capsule   ibuprofen (ADVIL) 800 MG tablet   nitrofurantoin (MACRODANTIN) 50 MG capsule   saccharomyces boulardii (FLORASTOR) 250 MG capsule   fexofenadine (ALLEGRA) 180 MG tablet   furosemide (LASIX) 40 MG tablet   glucose blood (ONE TOUCH ULTRA TEST) test strip   loperamide (IMODIUM A-D) 2 MG tablet   losartan (COZAAR) 50 MG tablet   metFORMIN (GLUCOPHAGE) 850 MG tablet   omeprazole (PRILOSEC) 40 MG capsule   ondansetron (ZOFRAN) 4 MG tablet   rosuvastatin (CRESTOR) 10 MG tablet   senna-docusate (SENOKOT-S) 8.6-50 MG tablet   Vitamin D, Ergocalciferol, (DRISDOL) 1.25 MG (50000 UNIT) CAPS capsule

## 2019-09-26 ENCOUNTER — Ambulatory Visit (HOSPITAL_BASED_OUTPATIENT_CLINIC_OR_DEPARTMENT_OTHER): Payer: PPO | Admitting: Physician Assistant

## 2019-09-26 ENCOUNTER — Encounter (HOSPITAL_BASED_OUTPATIENT_CLINIC_OR_DEPARTMENT_OTHER)
Admission: RE | Disposition: A | Discharge status: Home / Self Care | Payer: Self-pay | Source: Ambulatory Visit | Attending: Urology

## 2019-09-26 ENCOUNTER — Ambulatory Visit (HOSPITAL_BASED_OUTPATIENT_CLINIC_OR_DEPARTMENT_OTHER)
Admission: RE | Admit: 2019-09-26 | Discharge: 2019-09-26 | Disposition: A | Discharge status: Home / Self Care | Payer: PPO | Source: Ambulatory Visit | Attending: Urology | Admitting: Urology

## 2019-09-26 ENCOUNTER — Other Ambulatory Visit: Payer: Self-pay

## 2019-09-26 ENCOUNTER — Encounter (HOSPITAL_BASED_OUTPATIENT_CLINIC_OR_DEPARTMENT_OTHER): Payer: Self-pay | Admitting: Urology

## 2019-09-26 DIAGNOSIS — I10 Essential (primary) hypertension: Secondary | ICD-10-CM | POA: Insufficient documentation

## 2019-09-26 DIAGNOSIS — E785 Hyperlipidemia, unspecified: Secondary | ICD-10-CM | POA: Insufficient documentation

## 2019-09-26 DIAGNOSIS — Z833 Family history of diabetes mellitus: Secondary | ICD-10-CM | POA: Insufficient documentation

## 2019-09-26 DIAGNOSIS — Z791 Long term (current) use of non-steroidal anti-inflammatories (NSAID): Secondary | ICD-10-CM | POA: Diagnosis not present

## 2019-09-26 DIAGNOSIS — Z8349 Family history of other endocrine, nutritional and metabolic diseases: Secondary | ICD-10-CM | POA: Insufficient documentation

## 2019-09-26 DIAGNOSIS — D494 Neoplasm of unspecified behavior of bladder: Secondary | ICD-10-CM | POA: Diagnosis present

## 2019-09-26 DIAGNOSIS — Z79899 Other long term (current) drug therapy: Secondary | ICD-10-CM | POA: Insufficient documentation

## 2019-09-26 DIAGNOSIS — Z90721 Acquired absence of ovaries, unilateral: Secondary | ICD-10-CM | POA: Insufficient documentation

## 2019-09-26 DIAGNOSIS — K219 Gastro-esophageal reflux disease without esophagitis: Secondary | ICD-10-CM | POA: Insufficient documentation

## 2019-09-26 DIAGNOSIS — Z90710 Acquired absence of both cervix and uterus: Secondary | ICD-10-CM | POA: Insufficient documentation

## 2019-09-26 DIAGNOSIS — E118 Type 2 diabetes mellitus with unspecified complications: Secondary | ICD-10-CM | POA: Insufficient documentation

## 2019-09-26 DIAGNOSIS — Z7982 Long term (current) use of aspirin: Secondary | ICD-10-CM | POA: Diagnosis not present

## 2019-09-26 DIAGNOSIS — Z9841 Cataract extraction status, right eye: Secondary | ICD-10-CM | POA: Insufficient documentation

## 2019-09-26 DIAGNOSIS — Z6832 Body mass index (BMI) 32.0-32.9, adult: Secondary | ICD-10-CM | POA: Diagnosis not present

## 2019-09-26 DIAGNOSIS — I2510 Atherosclerotic heart disease of native coronary artery without angina pectoris: Secondary | ICD-10-CM | POA: Insufficient documentation

## 2019-09-26 DIAGNOSIS — D303 Benign neoplasm of bladder: Secondary | ICD-10-CM | POA: Insufficient documentation

## 2019-09-26 DIAGNOSIS — Z9842 Cataract extraction status, left eye: Secondary | ICD-10-CM | POA: Insufficient documentation

## 2019-09-26 DIAGNOSIS — K7581 Nonalcoholic steatohepatitis (NASH): Secondary | ICD-10-CM | POA: Diagnosis not present

## 2019-09-26 DIAGNOSIS — I252 Old myocardial infarction: Secondary | ICD-10-CM | POA: Insufficient documentation

## 2019-09-26 DIAGNOSIS — M4802 Spinal stenosis, cervical region: Secondary | ICD-10-CM | POA: Insufficient documentation

## 2019-09-26 DIAGNOSIS — Z803 Family history of malignant neoplasm of breast: Secondary | ICD-10-CM | POA: Insufficient documentation

## 2019-09-26 DIAGNOSIS — Z87891 Personal history of nicotine dependence: Secondary | ICD-10-CM | POA: Insufficient documentation

## 2019-09-26 DIAGNOSIS — E669 Obesity, unspecified: Secondary | ICD-10-CM | POA: Insufficient documentation

## 2019-09-26 DIAGNOSIS — Z8249 Family history of ischemic heart disease and other diseases of the circulatory system: Secondary | ICD-10-CM | POA: Diagnosis not present

## 2019-09-26 DIAGNOSIS — Z7984 Long term (current) use of oral hypoglycemic drugs: Secondary | ICD-10-CM | POA: Diagnosis not present

## 2019-09-26 DIAGNOSIS — Z955 Presence of coronary angioplasty implant and graft: Secondary | ICD-10-CM | POA: Insufficient documentation

## 2019-09-26 DIAGNOSIS — Z823 Family history of stroke: Secondary | ICD-10-CM | POA: Insufficient documentation

## 2019-09-26 DIAGNOSIS — Z800 Family history of malignant neoplasm of digestive organs: Secondary | ICD-10-CM | POA: Insufficient documentation

## 2019-09-26 DIAGNOSIS — Z8489 Family history of other specified conditions: Secondary | ICD-10-CM | POA: Insufficient documentation

## 2019-09-26 DIAGNOSIS — Z801 Family history of malignant neoplasm of trachea, bronchus and lung: Secondary | ICD-10-CM | POA: Insufficient documentation

## 2019-09-26 DIAGNOSIS — M1990 Unspecified osteoarthritis, unspecified site: Secondary | ICD-10-CM | POA: Diagnosis not present

## 2019-09-26 DIAGNOSIS — R300 Dysuria: Secondary | ICD-10-CM | POA: Diagnosis not present

## 2019-09-26 DIAGNOSIS — Z87442 Personal history of urinary calculi: Secondary | ICD-10-CM | POA: Insufficient documentation

## 2019-09-26 DIAGNOSIS — Z905 Acquired absence of kidney: Secondary | ICD-10-CM | POA: Insufficient documentation

## 2019-09-26 DIAGNOSIS — N3289 Other specified disorders of bladder: Secondary | ICD-10-CM | POA: Diagnosis not present

## 2019-09-26 HISTORY — DX: Bladder disorder, unspecified: N32.9

## 2019-09-26 HISTORY — DX: Other complications of anesthesia, initial encounter: T88.59XA

## 2019-09-26 HISTORY — DX: Dyspnea, unspecified: R06.00

## 2019-09-26 HISTORY — DX: Other specified postprocedural states: R11.2

## 2019-09-26 HISTORY — DX: Acute embolism and thrombosis of unspecified vein: I82.90

## 2019-09-26 HISTORY — PX: CYSTOSCOPY WITH BIOPSY: SHX5122

## 2019-09-26 HISTORY — DX: Other specified postprocedural states: Z98.890

## 2019-09-26 LAB — POCT I-STAT, CHEM 8
BUN: 23 mg/dL (ref 8–23)
Calcium, Ion: 1.36 mmol/L (ref 1.15–1.40)
Chloride: 103 mmol/L (ref 98–111)
Creatinine, Ser: 0.7 mg/dL (ref 0.44–1.00)
Glucose, Bld: 155 mg/dL — ABNORMAL HIGH (ref 70–99)
HCT: 42 % (ref 36.0–46.0)
Hemoglobin: 14.3 g/dL (ref 12.0–15.0)
Potassium: 4.1 mmol/L (ref 3.5–5.1)
Sodium: 140 mmol/L (ref 135–145)
TCO2: 29 mmol/L (ref 22–32)

## 2019-09-26 LAB — GLUCOSE, CAPILLARY: Glucose-Capillary: 132 mg/dL — ABNORMAL HIGH (ref 70–99)

## 2019-09-26 SURGERY — CYSTOSCOPY, WITH BIOPSY
Anesthesia: General | Site: Bladder

## 2019-09-26 MED ORDER — LIDOCAINE 2% (20 MG/ML) 5 ML SYRINGE
INTRAMUSCULAR | Status: DC | PRN
  Administered 2019-09-26: 50 mg via INTRAVENOUS

## 2019-09-26 MED ORDER — PROPOFOL 10 MG/ML IV BOLUS
INTRAVENOUS | Status: DC | PRN
  Administered 2019-09-26: 110 mg via INTRAVENOUS

## 2019-09-26 MED ORDER — FENTANYL CITRATE (PF) 100 MCG/2ML IJ SOLN
INTRAMUSCULAR | Status: DC | PRN
  Administered 2019-09-26: 25 ug via INTRAVENOUS
  Administered 2019-09-26: 50 ug via INTRAVENOUS
  Administered 2019-09-26: 25 ug via INTRAVENOUS

## 2019-09-26 MED ORDER — FENTANYL CITRATE (PF) 100 MCG/2ML IJ SOLN
INTRAMUSCULAR | Status: AC
  Filled 2019-09-26: qty 2

## 2019-09-26 MED ORDER — FENTANYL CITRATE (PF) 100 MCG/2ML IJ SOLN: 25.0000 ug | INTRAMUSCULAR | Status: DC | PRN

## 2019-09-26 MED ORDER — LIDOCAINE 2% (20 MG/ML) 5 ML SYRINGE
INTRAMUSCULAR | Status: AC
  Filled 2019-09-26: qty 5

## 2019-09-26 MED ORDER — LACTATED RINGERS IV SOLN: INTRAVENOUS | Status: DC

## 2019-09-26 MED ORDER — DEXAMETHASONE SODIUM PHOSPHATE 10 MG/ML IJ SOLN
INTRAMUSCULAR | Status: DC | PRN
  Administered 2019-09-26: 4 mg via INTRAVENOUS

## 2019-09-26 MED ORDER — TRAMADOL HCL 50 MG PO TABS: 50.0000 mg | ORAL_TABLET | Freq: Four times a day (QID) | ORAL | 0 refills | Status: DC | PRN

## 2019-09-26 MED ORDER — ACETAMINOPHEN 500 MG PO TABS: 1000.0000 mg | ORAL_TABLET | Freq: Once | ORAL | Status: DC

## 2019-09-26 MED ORDER — CEFAZOLIN SODIUM-DEXTROSE 2-4 GM/100ML-% IV SOLN
2.0000 g | INTRAVENOUS | Status: AC
  Administered 2019-09-26: 2 g via INTRAVENOUS

## 2019-09-26 MED ORDER — DEXAMETHASONE SODIUM PHOSPHATE 10 MG/ML IJ SOLN
INTRAMUSCULAR | Status: AC
  Filled 2019-09-26: qty 1

## 2019-09-26 MED ORDER — ONDANSETRON HCL 4 MG/2ML IJ SOLN
4.0000 mg | Freq: Once | INTRAMUSCULAR | Status: AC | PRN
  Administered 2019-09-26: 4 mg via INTRAVENOUS

## 2019-09-26 MED ORDER — PROPOFOL 10 MG/ML IV BOLUS
INTRAVENOUS | Status: AC
  Filled 2019-09-26: qty 40

## 2019-09-26 MED ORDER — ONDANSETRON HCL 4 MG/2ML IJ SOLN
INTRAMUSCULAR | Status: AC
  Filled 2019-09-26: qty 2

## 2019-09-26 MED ORDER — STERILE WATER FOR IRRIGATION IR SOLN
Status: DC | PRN
  Administered 2019-09-26: 3000 mL

## 2019-09-26 MED ORDER — CEFAZOLIN SODIUM-DEXTROSE 2-4 GM/100ML-% IV SOLN
INTRAVENOUS | Status: AC
  Filled 2019-09-26: qty 100

## 2019-09-26 SURGICAL SUPPLY — 17 items
BAG DRAIN URO-CYSTO SKYTR STRL (DRAIN) ×2 IMPLANT
BAG URINE DRAIN 2000ML AR STRL (UROLOGICAL SUPPLIES) ×1 IMPLANT
CATH FOLEY 2WAY SLVR  5CC 16FR (CATHETERS) ×1
CATH FOLEY 2WAY SLVR 5CC 16FR (CATHETERS) IMPLANT
CLOTH BEACON ORANGE TIMEOUT ST (SAFETY) ×1 IMPLANT
ELECT REM PT RETURN 9FT ADLT (ELECTROSURGICAL) ×2
ELECTRODE REM PT RTRN 9FT ADLT (ELECTROSURGICAL) ×1 IMPLANT
GLOVE BIO SURGEON STRL SZ8 (GLOVE) IMPLANT
GOWN STRL REUS W/TWL XL LVL3 (GOWN DISPOSABLE) ×2 IMPLANT
HOLDER FOLEY CATH W/STRAP (MISCELLANEOUS) ×1 IMPLANT
KIT TURNOVER CYSTO (KITS) ×2 IMPLANT
MANIFOLD NEPTUNE II (INSTRUMENTS) ×2 IMPLANT
NS IRRIG 500ML POUR BTL (IV SOLUTION) ×1 IMPLANT
SYR 20ML LL LF (SYRINGE) ×3 IMPLANT
TRAY CYSTO PACK (CUSTOM PROCEDURE TRAY) ×2 IMPLANT
TUBE CONNECTING 12X1/4 (SUCTIONS) ×2 IMPLANT
WATER STERILE IRR 3000ML UROMA (IV SOLUTION) ×2 IMPLANT

## 2019-09-26 NOTE — Op Note (Signed)
Preoperative diagnosis: bladder lesion  Postoperative diagnosis: Same  Procedure: 1 cystoscopy 2. Bladder biopsy with fulgeration  Attending: Nicolette Bang  Anesthesia: General  Estimated blood loss: Minimal  Drains: 16 French foley  Specimens:  Bladder biopsies x 2  Antibiotics: ancef  Findings:  Ureteral orifices in normal anatomic location. Posterior wall erythematous lesion which has increased in size from 0.5cm for 3cm.  Indications: Patient is a 74 year old female with a history of dysuria who was found to have a bladder lesion on office cystoscopy. After discussing treatment options, they decided proceed with bladder biopsy.  Procedure her in detail: The patient was brought to the operating room and a brief timeout was done to ensure correct patient, correct procedure, correct site. General anesthesia was administered patient was placed in dorsal lithotomy position. Their genitalia was then prepped and draped in usual sterile fashion. A rigid 82 French cystoscope was passed in the urethra and the bladder. Bladder was inspected and we noted an increase in size of the posterior wall bladder lesion. We proceeded to obtain two biopsies from the posterior wall where there were areas of erythema. Hemostasis was then obtained with a bugbee. the bladder was then drained, a 16 French foley was placed and this concluded the procedure which was well tolerated by patient.  Complications: None  Condition: Stable, extubated, transferred to PACU  Plan: Patient will be discharged home and will followup in 5 days for voiding trial

## 2019-09-26 NOTE — H&P (Signed)
Urology Admission H&P  Chief Complaint: Dysuria  History of Present Illness: Ms Mells is a 74yo here for bladder biopsy and fulgeration. She has a bladder lesion found on office cystoscopy. No hematuria. No worsening LUTS.   Past Medical History:  Diagnosis Date  . Allergy   . Atrophic kidney    left  . Blood clot in vein 45 yrs ago   inabdoment after surgery  . Blood transfusion without reported diagnosis   . Cataract   . Complication of anesthesia   . Coronary artery disease   . Coronary atherosclerosis   . Diabetes mellitus   . Diverticulosis 2005   Dr Jamal Collin  . Dyspnea    with exertion  . External hemorrhoids without mention of complication 1740  . GERD (gastroesophageal reflux disease)   . H/O cardiac catheterization 2005  . Heart murmur 2012   found by Dr. Jamal Collin  . Hyperlipidemia   . Hypertension   . Kidney stone   . Lesion of bladder   . Myocardial infarction ([place]) 2000  . Neuromuscular disorder ([place])    "achy muscles"  . Obesity   . Osteoarthritis   . PONV (postoperative nausea and vomiting)   . Recurrent UTI   . Spinal stenosis of cervical region   . Steatohepatitis 05/30/10   Brunt Stage 3, non alcoholic cirrhosis of the liver  . Type II or unspecified type diabetes mellitus without mention of complication, uncontrolled    Patient does takes Metformin and recommended to stop for 48 hours.  Marland Kitchen Ulcer   . Unspecified essential hypertension 2014   Past Surgical History:  Procedure Laterality Date  . ABDOMINAL HYSTERECTOMY  1982   complete  . BREAST CYST ASPIRATION Right 1990's   benign  . BREAST MASS EXCISION     benign  . CARDIAC CATHETERIZATION  2000   cardiac stent  . cardiac stents  2000  . CATARACT EXTRACTION     both eyes  . COLONOSCOPY  2004, 2015   Dr. Jamal Collin, Dr. Olevia Perches  . CYSTOSCOPY W/ RETROGRADES Left 11/12/2015   Procedure: CYSTOSCOPY WITH RETROGRADE PYELOGRAM;  Surgeon: Cleon Gustin, MD;  Location: [facility];  Service: Urology;   Laterality: Left;  . CYSTOSCOPY W/ URETERAL STENT PLACEMENT Left 11/12/2015   Procedure: CYSTOSCOPY WITH STENT REPLACEMENT;  Surgeon: Cleon Gustin, MD;  Location: [facility];  Service: Urology;  Laterality: Left;  . CYSTOSCOPY WITH URETEROSCOPY AND STENT PLACEMENT Left 10/15/2015   Procedure: CYSTOSCOPY WITH URETEROSCOPY AND STENT PLACEMENT;  Surgeon: Cleon Gustin, MD;  Location: [facility];  Service: Urology;  Laterality: Left;  . EYE SURGERY Bilateral 2012   cataract  . FOOT SURGERY  2008  . PTCA  2000   stent x 1  . ROBOTIC ASSITED PARTIAL NEPHRECTOMY Left 06/02/2019   Procedure: XI ROBOTIC ASSITED NEPHRECTOMY;  Surgeon: Cleon Gustin, MD;  Location: [facility];  Service: Urology;  Laterality: Left;  . SALPINGOOPHORECTOMY    . TONSILLECTOMY    . UPPER GI ENDOSCOPY    . URETEROSCOPY WITH HOLMIUM LASER LITHOTRIPSY Left 11/12/2015   Procedure: URETEROSCOPY WITH HOLMIUM LASER LITHOTRIPSY;  Surgeon: Cleon Gustin, MD;  Location: [facility];  Service: Urology;  Laterality: Left;  . URETEROTOMY  1960/1969   x 2 during childhood  . VAGINAL HYSTERECTOMY      Home Medications:  Current Facility-Administered Medications  Medication Dose Route Frequency Provider Last Rate Last Admin  . ceFAZolin (ANCEF) IVPB 2g/100 mL premix  2 g Intravenous 30  min Pre-Op Cleon Gustin, MD      . lactated ringers infusion   Intravenous Continuous Lyn Hollingshead, MD 50 mL/hr at 09/26/19 1054 New Bag at 09/26/19 1054   Allergies: No Known Allergies  Family History  Problem Relation Age of Onset  . Lung cancer Father   . Heart disease Father   . Diverticulosis Mother   . Diabetes Mother   . Stroke Mother 75  . Autoimmune disease Daughter   . Thyroid disease Daughter   . Thyroid disease Sister   . Stomach cancer Maternal Aunt   . Breast cancer Maternal Aunt   . Colon cancer Neg Hx   . Esophageal cancer Neg Hx   . Rectal cancer Neg Hx    Social History:  reports that she quit  smoking about 21 years ago. Her smoking use included cigarettes. She has a 19.00 pack-year smoking history. She has never used smokeless tobacco. She reports previous alcohol use. She reports that she does not use drugs.  Review of Systems  Genitourinary: Positive for dysuria.  All other systems reviewed and are negative.   Physical Exam:  Vital signs in last 24 hours: Temp:  [97.3 F (36.3 C)] 97.3 F (36.3 C) (05/25 1047) Pulse Rate:  [70] 70 (05/25 1047) Resp:  [16] 16 (05/25 1047) BP: (143)/(82) 143/82 (05/25 1047) SpO2:  [96 %] 96 % (05/25 1047) Weight:  [83 kg] 83 kg (05/25 1047) Physical Exam  Constitutional: She is oriented to person, place, and time. She appears well-developed and well-nourished.  HENT:  Head: Normocephalic and atraumatic.  Eyes: Pupils are equal, round, and reactive to light. EOM are normal.  Neck: No thyromegaly present.  Cardiovascular: Normal rate and regular rhythm.  Respiratory: Effort normal. No respiratory distress.  GI: Soft. She exhibits no distension.  Musculoskeletal:        General: No edema. Normal range of motion.     Cervical back: Normal range of motion.  Neurological: She is alert and oriented to person, place, and time.  Skin: Skin is warm and dry.  Psychiatric: She has a normal mood and affect. Her behavior is normal. Judgment and thought content normal.    Laboratory Data:  Results for orders placed or performed during the hospital encounter of 09/26/19 (from the past 24 hour(s))  I-STAT, chem 8     Status: Abnormal   Collection Time: 09/26/19 10:39 AM  Result Value Ref Range   Sodium 140 135 - 145 mmol/L   Potassium 4.1 3.5 - 5.1 mmol/L   Chloride 103 98 - 111 mmol/L   BUN 23 8 - 23 mg/dL   Creatinine, Ser 0.70 0.44 - 1.00 mg/dL   Glucose, Bld 155 (H) 70 - 99 mg/dL   Calcium, Ion 1.36 1.15 - 1.40 mmol/L   TCO2 29 22 - 32 mmol/L   Hemoglobin 14.3 12.0 - 15.0 g/dL   HCT 42.0 36.0 - 46.0 %   Recent Results (from the past  240 hour(s))  SARS CORONAVIRUS 2 (TAT 6-24 HRS) Nasopharyngeal Nasopharyngeal Swab     Status: None   Collection Time: 09/22/19  8:32 AM   Specimen: Nasopharyngeal Swab  Result Value Ref Range Status   SARS Coronavirus 2 NEGATIVE NEGATIVE Final    Comment: (NOTE) SARS-CoV-2 target nucleic acids are NOT DETECTED. The SARS-CoV-2 RNA is generally detectable in upper and lower respiratory specimens during the acute phase of infection. Negative results do not preclude SARS-CoV-2 infection, do not rule out co-infections with other pathogens,  and should not be used as the sole basis for treatment or other patient management decisions. Negative results must be combined with clinical observations, patient history, and epidemiological information. The expected result is Negative. Fact Sheet for Patients: SugarRoll.be Fact Sheet for Healthcare Providers: https://www.woods-mathews.com/ This test is not yet approved or cleared by the [place] FDA and  has been authorized for detection and/or diagnosis of SARS-CoV-2 by FDA under an Emergency Use Authorization (EUA). This EUA will remain  in effect (meaning this test can be used) for the duration of the COVID-19 declaration under Section 56 4(b)(1) of the Act, 21 U.S.C. section 360bbb-3(b)(1), unless the authorization is terminated or revoked sooner. Performed at [facility], [place] [address], [place], [place] [address]    Creatinine: Recent Labs    09/26/19 1039  CREATININE 0.70   Baseline Creatinine: unknwon  Impression/Assessment:  74yo with bladder lesion  Plan:  The risks/benefits/alterantives to bladder biopsy with fulgeration was explained to the patient and she understands and wishes to proceed with surgery  Nicolette Bang 09/26/2019, 12:18 PM

## 2019-09-26 NOTE — Transfer of Care (Signed)
Immediate Anesthesia Transfer of Care Note  Patient: Kimberly Huff  Procedure(s) Performed: CYSTOSCOPY WITH BIOPSY AND FULGURATION (N/A Bladder)  Patient Location: PACU  Anesthesia Type:General  Level of Consciousness: awake, alert  and oriented  Airway & Oxygen Therapy: Patient Spontanous Breathing and Patient connected to nasal cannula oxygen  Post-op Assessment: Report given to RN  Post vital signs: Reviewed and stable  Last Vitals:  Vitals Value Taken Time  BP 152/76 09/26/19 1308  Temp    Pulse 68 09/26/19 1310  Resp 15 09/26/19 1310  SpO2 99 % 09/26/19 1310  Vitals shown include unvalidated device data.  Last Pain:  Vitals:   09/26/19 1047  TempSrc: Oral  PainSc: 4       Patients Stated Pain Goal: 4 (06/58/26 0888)  Complications: No apparent anesthesia complications

## 2019-09-26 NOTE — Discharge Instructions (Signed)
Indwelling Urinary Catheter Care, Adult An indwelling urinary catheter is a thin tube that is put into your bladder. The tube helps to drain pee (urine) out of your body. The tube goes in through your urethra. Your urethra is where pee comes out of your body. Your pee will come out through the catheter, then it will go into a bag (drainage bag). Take good care of your catheter so it will work well. How to wear your catheter and bag Supplies needed  Sticky tape (adhesive tape) or a leg strap.  Alcohol wipe or soap and water (if you use tape).  A clean towel (if you use tape).  Large overnight bag.  Smaller bag (leg bag). Wearing your catheter Attach your catheter to your leg with tape or a leg strap.  Make sure the catheter is not pulled tight.  If a leg strap gets wet, take it off and put on a dry strap.  If you use tape to hold the bag on your leg: 1. Use an alcohol wipe or soap and water to wash your skin where the tape made it sticky before. 2. Use a clean towel to pat-dry that skin. 3. Use new tape to make the bag stay on your leg. Wearing your bags You should have been given a large overnight bag.  You may wear the overnight bag in the day or night.  Always have the overnight bag lower than your bladder.  Do not let the bag touch the floor.  Before you go to sleep, put a clean plastic bag in a wastebasket. Then hang the overnight bag inside the wastebasket. You should also have a smaller leg bag that fits under your clothes.  Always wear the leg bag below your knee.  Do not wear your leg bag at night. How to care for your skin and catheter Supplies needed  A clean washcloth.  Water and mild soap.  A clean towel. Caring for your skin and catheter      Clean the skin around your catheter every day: 1. Wash your hands with soap and water. 2. Wet a clean washcloth in warm water and mild soap. 3. Clean the skin around your urethra.  If you are  female:  Gently spread the folds of skin around your vagina (labia).  With the washcloth in your other hand, wipe the inner side of your labia on each side. Wipe from front to back.  If you are female:  Pull back any skin that covers the end of your penis (foreskin).  With the washcloth in your other hand, wipe your penis in small circles. Start wiping at the tip of your penis, then move away from the catheter.  Move the foreskin back in place, if needed. 4. With your free hand, hold the catheter close to where it goes into your body.  Keep holding the catheter during cleaning so it does not get pulled out. 5. With the washcloth in your other hand, clean the catheter.  Only wipe downward on the catheter.  Do not wipe upward toward your body. Doing this may push germs into your urethra and cause infection. 6. Use a clean towel to pat-dry the catheter and the skin around it. Make sure to wipe off all soap. 7. Wash your hands with soap and water.  Shower every day. Do not take baths.  Do not use cream, ointment, or lotion on the area where the catheter goes into your body, unless your doctor tells you  to.  Do not use powders, sprays, or lotions on your genital area.  Check your skin around the catheter every day for signs of infection. Check for: ? Redness, swelling, or pain. ? Fluid or blood. ? Warmth. ? Pus or a bad smell. How to empty the bag Supplies needed  Rubbing alcohol.  Gauze pad or cotton ball.  Tape or a leg strap. Emptying the bag Pour the pee out of your bag when it is ?- full, or at least 2-3 times a day. Do this for your overnight bag and your leg bag. 1. Wash your hands with soap and water. 2. Separate (detach) the bag from your leg. 3. Hold the bag over the toilet or a clean pail. Keep the bag lower than your hips and bladder. This is so the pee (urine) does not go back into the tube. 4. Open the pour spout. It is at the bottom of the bag. 5. Empty the  pee into the toilet or pail. Do not let the pour spout touch any surface. 6. Put rubbing alcohol on a gauze pad or cotton ball. 7. Use the gauze pad or cotton ball to clean the pour spout. 8. Close the pour spout. 9. Attach the bag to your leg with tape or a leg strap. 10. Wash your hands with soap and water. Follow instructions for cleaning the drainage bag:  From the product maker.  As told by your doctor. How to change the bag Supplies needed  Alcohol wipes.  A clean bag.  Tape or a leg strap. Changing the bag Replace your bag when it starts to leak, smell bad, or look dirty. 1. Wash your hands with soap and water. 2. Separate the dirty bag from your leg. 3. Pinch the catheter with your fingers so that pee does not spill out. 4. Separate the catheter tube from the bag tube where these tubes connect (at the connection valve). Do not let the tubes touch any surface. 5. Clean the end of the catheter tube with an alcohol wipe. Use a different alcohol wipe to clean the end of the bag tube. 6. Connect the catheter tube to the tube of the clean bag. 7. Attach the clean bag to your leg with tape or a leg strap. Do not make the bag tight on your leg. 8. Wash your hands with soap and water. General rules   Never pull on your catheter. Never try to take it out. Doing that can hurt you.  Always wash your hands before and after you touch your catheter or bag. Use a mild, fragrance-free soap. If you do not have soap and water, use hand sanitizer.  Always make sure there are no twists or bends (kinks) in the catheter tube.  Always make sure there are no leaks in the catheter or bag.  Drink enough fluid to keep your pee pale yellow.  Do not take baths, swim, or use a hot tub.  If you are female, wipe from front to back after you poop (have a bowel movement). Contact a doctor if:  Your pee is cloudy.  Your pee smells worse than usual.  Your catheter gets clogged.  Your catheter  leaks.  Your bladder feels full. Get help right away if:  You have redness, swelling, or pain where the catheter goes into your body.  You have fluid, blood, pus, or a bad smell coming from the area where the catheter goes into your body.  Your skin feels warm where  the catheter goes into your body.  You have a fever.  You have pain in your: ? Belly (abdomen). ? Legs. ? Lower back. ? Bladder.  You see blood in the catheter.  Your pee is pink or red.  You feel sick to your stomach (nauseous).  You throw up (vomit).  You have chills.  Your pee is not draining into the bag.  Your catheter gets pulled out. Summary  An indwelling urinary catheter is a thin tube that is placed into the bladder to help drain pee (urine) out of the body.  The catheter is placed into the part of the body that drains pee from the bladder (urethra).  Taking good care of your catheter will keep it working properly and help prevent problems.  Always wash your hands before and after touching your catheter or bag.  Never pull on your catheter or try to take it out. This information is not intended to replace advice given to you by your health care provider. Make sure you discuss any questions you have with your health care provider. Document Revised: 08/12/2018 Document Reviewed: 12/04/2016 Elsevier Patient Education  [place] Instructions  Activity: Get plenty of rest for the remainder of the day. A responsible individual must stay with you for 24 hours following the procedure.  For the next 24 hours, DO NOT: -Drive a car -Paediatric nurse -Drink alcoholic beverages -Take any medication unless instructed by your physician -Make any legal decisions or sign important papers.  Meals: Start with liquid foods such as gelatin or soup. Progress to regular foods as tolerated. Avoid greasy, spicy, heavy foods. If nausea and/or vomiting occur, drink only  clear liquids until the nausea and/or vomiting subsides. Call your physician if vomiting continues.  Special Instructions/Symptoms: Your throat may feel dry or sore from the anesthesia or the breathing tube placed in your throat during surgery. If this causes discomfort, gargle with warm salt water. The discomfort should disappear within 24 hours.

## 2019-09-26 NOTE — Anesthesia Preprocedure Evaluation (Signed)
Anesthesia Evaluation  Patient identified by MRN, date of birth, ID band Patient awake    Reviewed: Allergy & Precautions, NPO status , Patient's Chart, lab work & pertinent test results  History of Anesthesia Complications Negative for: history of anesthetic complications  Airway Mallampati: II  TM Distance: >3 FB Neck ROM: Full    Dental  (+) Dental Advisory Given, Teeth Intact   Pulmonary former smoker,    Pulmonary exam normal        Cardiovascular hypertension, Pt. on medications + CAD, + Past MI and + Cardiac Stents   Rhythm:Regular Rate:Normal + Systolic murmurs    Neuro/Psych negative neurological ROS  negative psych ROS   GI/Hepatic GERD  Medicated and Controlled,(+) Hepatitis -  Endo/Other  diabetes, Type 2, Oral Hypoglycemic Agents Obesity   Renal/GU Renal disease Atrophic left kidney s/p nephrectomy     Musculoskeletal  (+) Arthritis ,   Abdominal   Peds  Hematology negative hematology ROS (+)   Anesthesia Other Findings Covid neg 1/26  Reproductive/Obstetrics                             Anesthesia Physical  Anesthesia Plan  ASA: III  Anesthesia Plan: General   Post-op Pain Management:    Induction: Intravenous  PONV Risk Score and Plan: 4 or greater and Treatment may vary due to age or medical condition, Ondansetron and Dexamethasone  Airway Management Planned: Oral ETT and LMA  Additional Equipment: None  Intra-op Plan:   Post-operative Plan: Extubation in OR  Informed Consent: I have reviewed the patients History and Physical, chart, labs and discussed the procedure including the risks, benefits and alternatives for the proposed anesthesia with the patient or authorized representative who has indicated his/her understanding and acceptance.     Dental advisory given  Plan Discussed with: CRNA and Anesthesiologist  Anesthesia Plan Comments:          Anesthesia Quick Evaluation

## 2019-09-26 NOTE — Anesthesia Procedure Notes (Signed)
Procedure Name: LMA Insertion Date/Time: 09/26/2019 12:44 PM Performed by: Bonney Aid, CRNA Pre-anesthesia Checklist: Patient identified, Emergency Drugs available, Suction available and Patient being monitored Patient Re-evaluated:Patient Re-evaluated prior to induction Oxygen Delivery Method: Circle system utilized Preoxygenation: Pre-oxygenation with 100% oxygen Induction Type: IV induction Ventilation: Mask ventilation without difficulty LMA: LMA inserted LMA Size: 4.0 Number of attempts: 1 Airway Equipment and Method: Bite block Placement Confirmation: positive ETCO2 Tube secured with: Tape Dental Injury: Teeth and Oropharynx as per pre-operative assessment

## 2019-09-26 NOTE — Anesthesia Postprocedure Evaluation (Signed)
Anesthesia Post Note  Patient: [name]  Procedure(s) Performed: CYSTOSCOPY WITH BIOPSY AND FULGURATION (N/A Bladder)     Patient location during evaluation: PACU Anesthesia Type: General Level of consciousness: awake and alert Pain management: pain level controlled Vital Signs Assessment: post-procedure vital signs reviewed and stable Respiratory status: spontaneous breathing, nonlabored ventilation, respiratory function stable and patient connected to nasal cannula oxygen Cardiovascular status: blood pressure returned to baseline and stable Postop Assessment: no apparent nausea or vomiting Anesthetic complications: no    Last Vitals:  Vitals:   09/26/19 1315 09/26/19 1330  BP: (!) 150/95 (!) 148/68  Pulse: 65 70  Resp: 12 17  Temp:    SpO2: 97% 93%    Last Pain:  Vitals:   09/26/19 1308  TempSrc:   PainSc: 5                  Tiajuana Amass

## 2019-09-27 ENCOUNTER — Other Ambulatory Visit: Payer: PPO

## 2019-09-27 LAB — SURGICAL PATHOLOGY

## 2019-09-29 ENCOUNTER — Ambulatory Visit: Payer: PPO | Admitting: Internal Medicine

## 2019-09-29 DIAGNOSIS — N3021 Other chronic cystitis with hematuria: Secondary | ICD-10-CM | POA: Diagnosis not present

## 2019-10-04 ENCOUNTER — Other Ambulatory Visit (INDEPENDENT_AMBULATORY_CARE_PROVIDER_SITE_OTHER): Payer: PPO

## 2019-10-04 ENCOUNTER — Other Ambulatory Visit: Payer: Self-pay

## 2019-10-04 DIAGNOSIS — E785 Hyperlipidemia, unspecified: Secondary | ICD-10-CM

## 2019-10-04 DIAGNOSIS — E1121 Type 2 diabetes mellitus with diabetic nephropathy: Secondary | ICD-10-CM

## 2019-10-04 DIAGNOSIS — E1169 Type 2 diabetes mellitus with other specified complication: Secondary | ICD-10-CM

## 2019-10-04 LAB — LIPID PANEL
Cholesterol: 95 mg/dL (ref 0–200)
HDL: 23.4 mg/dL — ABNORMAL LOW (ref >39.00)
LDL Cholesterol: 38 mg/dL (ref 0–99)
NonHDL: 71.32
Total CHOL/HDL Ratio: 4
Triglycerides: 168 mg/dL — ABNORMAL HIGH (ref 0.0–149.0)
VLDL: 33.6 mg/dL (ref 0.0–40.0)

## 2019-10-04 LAB — COMPREHENSIVE METABOLIC PANEL
ALT: 15 U/L (ref 0–35)
AST: 12 U/L (ref 0–37)
Albumin: 4.1 g/dL (ref 3.5–5.2)
Alkaline Phosphatase: 43 U/L (ref 39–117)
BUN: 21 mg/dL (ref 6–23)
CO2: 29 mEq/L (ref 19–32)
Calcium: 9.7 mg/dL (ref 8.4–10.5)
Chloride: 105 mEq/L (ref 96–112)
Creatinine, Ser: 0.75 mg/dL (ref 0.40–1.20)
GFR: 75.11 mL/min (ref >60.00)
Glucose, Bld: 187 mg/dL — ABNORMAL HIGH (ref 70–99)
Potassium: 4.7 mEq/L (ref 3.5–5.1)
Sodium: 141 mEq/L (ref 135–145)
Total Bilirubin: 0.4 mg/dL (ref 0.2–1.2)
Total Protein: 6.6 g/dL (ref 6.0–8.3)

## 2019-10-04 LAB — HEMOGLOBIN A1C: Hgb A1c MFr Bld: 6.8 % — ABNORMAL HIGH (ref 4.6–6.5)

## 2019-10-06 ENCOUNTER — Ambulatory Visit (INDEPENDENT_AMBULATORY_CARE_PROVIDER_SITE_OTHER): Payer: PPO | Admitting: Internal Medicine

## 2019-10-06 ENCOUNTER — Encounter: Payer: Self-pay | Admitting: Internal Medicine

## 2019-10-06 ENCOUNTER — Other Ambulatory Visit: Payer: Self-pay

## 2019-10-06 VITALS — BP 132/80 | HR 76 | Temp 97.7°F | Resp 16 | Ht 63.0 in | Wt 188.4 lb

## 2019-10-06 DIAGNOSIS — R300 Dysuria: Secondary | ICD-10-CM | POA: Diagnosis not present

## 2019-10-06 DIAGNOSIS — E1121 Type 2 diabetes mellitus with diabetic nephropathy: Secondary | ICD-10-CM | POA: Diagnosis not present

## 2019-10-06 DIAGNOSIS — N3090 Cystitis, unspecified without hematuria: Secondary | ICD-10-CM

## 2019-10-06 DIAGNOSIS — Z9889 Other specified postprocedural states: Secondary | ICD-10-CM | POA: Diagnosis not present

## 2019-10-06 LAB — URINALYSIS, ROUTINE W REFLEX MICROSCOPIC
Bilirubin Urine: NEGATIVE
Ketones, ur: NEGATIVE
Nitrite: POSITIVE — AB
Specific Gravity, Urine: 1.02 (ref 1.000–1.030)
Urine Glucose: NEGATIVE
Urobilinogen, UA: 0.2 (ref 0.0–1.0)
pH: 7 (ref 5.0–8.0)

## 2019-10-06 NOTE — Patient Instructions (Addendum)
I am checking your urine for infection   IF THERE IS NO SIGN OF UTI, I'LL TREAT YOU FOR YEAST   Your labs are fine.  We should repeat your A1c in 3 months rather than 6  Use Azo and tramadol   Melatonin up to 10 mg at dinner time NOT AT BEDTIME   BENADRYL 25 MG ONE HOUR BEFORE AT BEDTIME (FOR THE POST NASAL DRIP)   Paradoxical Vocal Fold Motion  Paradoxical vocal fold motion is a condition that causes shortness of breath and noisy breathing (stridor). It may also be called vocal cord dysfunction. Normally, the vocal cords (vocal folds) inside the voice box (larynx) open when you breathe in and out. If you have paradoxical vocal fold motion, your vocal cords close when you try to breathe. This makes breathing difficult. This condition can be scary, but it is usually not dangerous. It can be confused with asthma because the symptoms are similar. What are the causes? Paradoxical vocal fold motion is caused by overreaction (hyperexcitability) of the nerves that provide movement and feeling in the vocal cords. The cause of hyperexcitability is not known, but sometimes triggers can be identified. Common triggers of vocal cord hyperexcitability include:  Stomach acid flowing up into your throat (gastric reflux).  Mucus going down the back of your throat (postnasal drip).  Infection in the nose, mouth, throat, or larynx (upper respiratory tract infection).  Exercise.  Strong smells.  Cigarette smoke or other irritating substances in the air.  Stress.  Allergies. What are the signs or symptoms? Symptoms of this condition include:  Shortness of breath. This may happen at rest or during exercise.  Frequent coughing and clearing the throat.  A feeling of choking or tightness in the throat.  Stridor or wheezing.  Hoarse voice. Symptoms may come and go. They do not occur during sleep. They may range from mild to severe. How is this diagnosed? This condition can be hard to diagnose  because it comes and goes and is similar to asthma. Your health care provider may suspect paradoxical vocal fold motion if you do not have symptoms while you sleep or if asthma medicines do not relieve your symptoms. Your health care provider may:  Have you work with (refer you to) an ears, nose, and throat specialist (otolaryngologist, ENT) or a speech and language specialist (speech-language pathologist) who will examine your vocal cords to see if they close when you breathe. This examination is usually done by inserting a thin, flexible tube with a light and camera on the end of it through your nose to look down into your larynx (laryngoscopy). This is the most reliable way to diagnose the condition.  Refer you to a breathing specialist (pulmonologist) who will do breathing tests (spirometry or pulmonary function tests, PFTs) to check for a breathing pattern that is typical for this condition.  Do a physical exam.  Ask about your symptoms and what seems to cause them (your triggers).  Listen to your breathing.  Test your blood for: ? Oxygen levels. ? Allergies.  Do a chest X-ray. How is this treated? This condition is treated with speech or voice therapy, which includes breathing and relaxation exercises to help you learn how to relax and control your vocal cords. After you learn those exercises, you can practice them at home and use them to stop an attack. Talk therapy (psychotherapy) with a psychologist can help you learn ways to reduce stress and anxiety and avoid triggers. In some cases,  your health care provider may recommend medicines to treat an underlying condition that can trigger paradoxical vocal fold motion. These may include medicines for:  Postnasal drip.  Gastric reflux.  Upper respiratory tract infection.  Allergies.  Stress. Follow these instructions at home:  Take over-the-counter and prescription medicines only as told by your health care provider.  Follow  instructions from your speech or voice therapist about practicing your vocal cord relaxation and breathing exercises at home.  Follow instructions from your psychologist about how to lower your stress and anxiety.  Return to your normal activities as told by your health care provider. Ask your health care provider what activities are safe for you.  Avoid triggers.  Do not use any products that contain nicotine or tobacco, such as cigarettes and e-cigarettes. If you need help quitting, ask your health care provider. Cigarette smoke may trigger this condition.  Keep all follow-up visits as told by your health care providers, including therapists. This is important. Contact a health care provider if:  Your symptoms do not get better with treatment and home care. Get help right away if:  You have chest pain.  You have trouble breathing that is not relieved by doing exercises and following instructions from your health care providers. Summary  Paradoxical vocal fold motion is a condition that causes your vocal cords (vocal folds) to close when you try to breathe.  This condition is often triggered by stress or irritation of your vocal cords.  Symptoms include sudden shortness of breath and noisy breathing (stridor).  Doing a vocal cord exam (laryngoscopy) is the most reliable way to diagnose this condition.  This condition is treated with speech or voice therapy and talk therapy (psychotherapy). This information is not intended to replace advice given to you by your health care provider. Make sure you discuss any questions you have with your health care provider. Document Revised: 08/11/2018 Document Reviewed: 12/16/2016 Elsevier Patient Education  [place].

## 2019-10-06 NOTE — Progress Notes (Signed)
Subjective:  Patient ID: Kimberly Huff, female    DOB: [birth year]  Age: 74 y.o. MRN: [id number]  CC: The primary encounter diagnosis was S/P cystoscopy. Diagnoses of Dysuria, Controlled type 2 diabetes mellitus with diabetic nephropathy, without long-term current use of insulin ([place]), and Cystitis were also pertinent to this visit.  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 Foley was removed one week ago in office,  And she has been having  occasional excruciating pain with urination . still TAKING abx and probiotics for macrodantin one daily . Urine odor  Is foul;Has not recovered from last vaccine;  .     Outpatient Medications Prior to Visit  Medication Sig Dispense Refill  . aspirin 325 MG tablet Take 325 mg by mouth daily.    . Cinnamon 500 MG capsule Take 500 mg by mouth daily. 1000 mg bid    . estradiol (ESTRACE) 0.1 MG/GM vaginal cream APPLY A PEA SIZED AMOUNT TO YOUR URETHRA DAILY FOR 2 WEEKS THEN APPLY TWICE PER WEEK    . fexofenadine (ALLEGRA) 180 MG tablet Take 180 mg by mouth daily as needed for allergies or rhinitis.    . furosemide (LASIX) 40 MG tablet TAKE ONE TABLET BY MOUTH EVERY DAY (Patient taking differently: Take 40 mg by mouth 4 (four) times a week. ) 90 tablet 1  . glucose blood (ONE TOUCH ULTRA TEST) test strip Use as instructed 100 each 12  . ibuprofen (ADVIL) 800 MG tablet Take 800 mg by mouth every 8 (eight) hours as needed.    . loperamide (IMODIUM A-D) 2 MG tablet Take 4 mg by mouth 2 (two) times daily as needed for diarrhea or loose stools.    Marland Kitchen losartan (COZAAR) 50 MG tablet TAKE ONE TABLET EVERY DAY (Patient taking differently: Take 50 mg by mouth daily. ) 90 tablet 3  . metFORMIN (GLUCOPHAGE) 850 MG tablet TAKE ONE TABLET TWICE DAILY WITH A MEAL 180 tablet  1  . nitrofurantoin (MACRODANTIN) 50 MG capsule Take 50 mg by mouth daily.    Marland Kitchen omeprazole (PRILOSEC) 40 MG capsule TAKE 1 CAPSULE BY MOUTH ONCE DAILY (Patient taking differently: Take 40 mg by mouth daily. ) 90 capsule 1  . ondansetron (ZOFRAN) 4 MG tablet Take 4 mg by mouth every 6 (six) hours as needed.    . rosuvastatin (CRESTOR) 10 MG tablet TAKE ONE TABLET EVERY DAY (Patient taking differently: Take 10 mg by mouth at bedtime. ) 90 tablet 1  . saccharomyces boulardii (FLORASTOR) 250 MG capsule Take 250 mg by mouth daily.    Marland Kitchen senna-docusate (SENOKOT-S) 8.6-50 MG tablet Take 2 tablets by mouth daily as needed for mild constipation. 30 tablet 1  . traMADol (ULTRAM) 50 MG tablet Take 1 tablet (50 mg total) by mouth every 6 (six) hours as needed. 15 tablet 0  . Vitamin D, Ergocalciferol, (DRISDOL) 1.25 MG (50000 UNIT) CAPS capsule TAKE ONE CAPSULE EACH WEEK (Patient taking differently: friday) 4 capsule 11   No facility-administered medications prior to visit.    Review of Systems;  Patient denies headache, fevers, malaise, unintentional weight loss, skin rash, eye pain, sinus congestion and sinus pain, sore throat, dysphagia,  hemoptysis , cough, dyspnea, wheezing, chest pain, palpitations, orthopnea, edema, abdominal pain, nausea, melena, diarrhea, constipation, flank pain, dysuria, hematuria,  urinary  Frequency, nocturia, numbness, tingling, seizures,  Focal weakness, Loss of consciousness,  Tremor, insomnia, depression, anxiety, and suicidal ideation.      Objective:  BP 132/80 (BP Location: Left Arm, Patient Position: Sitting, Cuff Size: Normal)   Pulse 76   Temp 97.7 F (36.5 C) (Temporal)   Resp 16   Ht 5' 3"  (1.6 m)   Wt 188 lb 6.4 oz (85.5 kg)   SpO2 96%   BMI 33.37 kg/m   BP Readings from Last 3 Encounters:  10/06/19 132/80  09/26/19 (!) 141/73  06/05/19 (!) 151/83    Wt Readings from Last 3 Encounters:  10/06/19 188 lb 6.4 oz (85.5 kg)  09/26/19 183 lb (83 kg)    06/14/19 188 lb (85.3 kg)    General appearance: alert, cooperative and appears stated age Ears: normal TM's and external ear canals both ears Throat: lips, mucosa, and tongue normal; teeth and gums normal Neck: no adenopathy, no carotid bruit, supple, symmetrical, trachea midline and thyroid not enlarged, symmetric, no tenderness/mass/nodules Back: symmetric, no curvature. ROM normal. No CVA tenderness. Lungs: clear to auscultation bilaterally Heart: regular rate and rhythm, S1, S2 normal, no murmur, click, rub or gallop Abdomen: soft, non-tender; bowel sounds normal; no masses,  no organomegaly Pulses: 2+ and symmetric Skin: Skin color, texture, turgor normal. No rashes or lesions Lymph nodes: Cervical, supraclavicular, and axillary nodes normal.  Lab Results  Component Value Date   HGBA1C 6.8 (H) 10/04/2019   HGBA1C 6.0 (H) 05/30/2019   HGBA1C 7.0 (H) 12/23/2018    Lab Results  Component Value Date   CREATININE 0.75 10/04/2019   CREATININE 0.70 09/26/2019   CREATININE 0.88 06/29/2019    Lab Results  Component Value Date   WBC 7.5 06/04/2019   HGB 14.3 09/26/2019   HCT 42.0 09/26/2019   PLT 117 (L) 06/04/2019   GLUCOSE 187 (H) 10/04/2019   CHOL 95 10/04/2019   TRIG 168.0 (H) 10/04/2019   HDL 23.40 (L) 10/04/2019   LDLDIRECT 57.0 06/29/2019   LDLCALC 38 10/04/2019   ALT 15 10/04/2019   AST 12 10/04/2019   NA 141 10/04/2019   K 4.7 10/04/2019   CL 105 10/04/2019   CREATININE 0.75 10/04/2019   BUN 21 10/04/2019   CO2 29 10/04/2019   TSH 0.76 09/10/2014   INR 1.0 09/10/2014   HGBA1C 6.8 (H) 10/04/2019   MICROALBUR 6.9 (H) 06/29/2019    No results found.  Assessment & Plan:   Problem List Items Addressed This Visit      Unprioritized   Cystitis    urinalysis and culture were negative for infections.       Dysuria   Relevant Orders   Urinalysis, Routine w reflex microscopic (Completed)   Urine Culture (Completed)   Type 2 diabetes mellitus,  controlled, with renal complications (HCC)    Excellent control again, no longer taking glipizide before dinner due to decreased appetite .  Continue metformin given dx of fatty lliver..  Patient is up to date on  annual eye exam  And has early  Microalbuminuria. She is  tolerating an  ACE/ARB for renal protection and hypertension .   Lab Results  Component Value Date   HGBA1C 6.8 (H) 10/04/2019   Lab Results  Component Value Date   MICROALBUR 6.9 (H) 06/29/2019               Other Visit Diagnoses    S/P cystoscopy    -  Primary   Relevant  Orders   Ambulatory referral to Urology      I am having [name] "[name]" maintain her glucose blood, losartan, rosuvastatin, furosemide, omeprazole, loperamide, fexofenadine, senna-docusate, ondansetron, metFORMIN, Vitamin D (Ergocalciferol), nitrofurantoin, Cinnamon, saccharomyces boulardii, aspirin, ibuprofen, traMADol, and estradiol.  No orders of the defined types were placed in this encounter.   There are no discontinued medications.  Follow-up: No follow-ups on file.   [name], MD

## 2019-10-07 LAB — URINE CULTURE
MICRO NUMBER:: 10554249
SPECIMEN QUALITY:: ADEQUATE

## 2019-10-08 DIAGNOSIS — N3020 Other chronic cystitis without hematuria: Secondary | ICD-10-CM | POA: Insufficient documentation

## 2019-10-08 NOTE — Assessment & Plan Note (Addendum)
Excellent control again, no longer taking glipizide before dinner due to decreased appetite .  Continue metformin given dx of fatty lliver..  Patient is up to date on  annual eye exam  And has early  Microalbuminuria. She is  tolerating an  ACE/ARB for renal protection and hypertension .   Lab Results  Component Value Date   HGBA1C 6.8 (H) 10/04/2019   Lab Results  Component Value Date   MICROALBUR 6.9 (H) 06/29/2019

## 2019-10-08 NOTE — Assessment & Plan Note (Addendum)
urinalysis and culture were negative for infections.

## 2019-10-09 ENCOUNTER — Other Ambulatory Visit: Payer: Self-pay | Admitting: Internal Medicine

## 2019-10-09 DIAGNOSIS — R300 Dysuria: Secondary | ICD-10-CM

## 2019-10-09 DIAGNOSIS — N289 Disorder of kidney and ureter, unspecified: Secondary | ICD-10-CM

## 2019-10-09 MED ORDER — CIPROFLOXACIN HCL 250 MG PO TABS
250.0000 mg | ORAL_TABLET | Freq: Two times a day (BID) | ORAL | 0 refills | Status: DC
Start: 2019-10-09 — End: 2020-01-22

## 2019-10-09 NOTE — Assessment & Plan Note (Signed)
Will treat with cipro x 3 days given ambiguous results of urine culture /UA showing pyuria and bacteria .

## 2019-10-10 ENCOUNTER — Other Ambulatory Visit: Payer: Self-pay | Admitting: Internal Medicine

## 2019-10-20 DIAGNOSIS — E782 Mixed hyperlipidemia: Secondary | ICD-10-CM | POA: Diagnosis not present

## 2019-10-20 DIAGNOSIS — I214 Non-ST elevation (NSTEMI) myocardial infarction: Secondary | ICD-10-CM | POA: Diagnosis not present

## 2019-10-20 DIAGNOSIS — R0600 Dyspnea, unspecified: Secondary | ICD-10-CM | POA: Diagnosis not present

## 2019-10-20 DIAGNOSIS — I10 Essential (primary) hypertension: Secondary | ICD-10-CM | POA: Diagnosis not present

## 2019-10-20 DIAGNOSIS — E119 Type 2 diabetes mellitus without complications: Secondary | ICD-10-CM | POA: Diagnosis not present

## 2019-10-20 DIAGNOSIS — R531 Weakness: Secondary | ICD-10-CM | POA: Diagnosis not present

## 2019-10-20 DIAGNOSIS — Z905 Acquired absence of kidney: Secondary | ICD-10-CM | POA: Diagnosis not present

## 2019-10-20 DIAGNOSIS — I2510 Atherosclerotic heart disease of native coronary artery without angina pectoris: Secondary | ICD-10-CM | POA: Diagnosis not present

## 2019-10-29 ENCOUNTER — Other Ambulatory Visit: Payer: Self-pay | Admitting: Internal Medicine

## 2019-10-29 MED ORDER — IBUPROFEN 800 MG PO TABS: 800.0000 mg | ORAL_TABLET | ORAL | 1 refills | Status: DC

## 2019-11-08 ENCOUNTER — Other Ambulatory Visit: Payer: Self-pay | Admitting: Internal Medicine

## 2019-11-13 ENCOUNTER — Ambulatory Visit: Payer: PPO | Admitting: Urology

## 2019-12-05 DIAGNOSIS — R0981 Nasal congestion: Secondary | ICD-10-CM | POA: Diagnosis not present

## 2019-12-05 DIAGNOSIS — Z20822 Contact with and (suspected) exposure to covid-19: Secondary | ICD-10-CM | POA: Diagnosis not present

## 2019-12-06 ENCOUNTER — Telehealth: Payer: Self-pay | Admitting: Internal Medicine

## 2019-12-06 NOTE — Telephone Encounter (Signed)
Patient scheduled for Prolia 12/28/19

## 2019-12-13 ENCOUNTER — Encounter: Payer: Self-pay | Admitting: Urology

## 2019-12-13 ENCOUNTER — Ambulatory Visit (INDEPENDENT_AMBULATORY_CARE_PROVIDER_SITE_OTHER): Payer: PPO | Admitting: Urology

## 2019-12-13 ENCOUNTER — Other Ambulatory Visit: Payer: Self-pay

## 2019-12-13 VITALS — BP 128/77 | HR 99 | Temp 98.1°F | Ht 63.0 in | Wt 188.4 lb

## 2019-12-13 DIAGNOSIS — N200 Calculus of kidney: Secondary | ICD-10-CM | POA: Diagnosis not present

## 2019-12-13 DIAGNOSIS — N3020 Other chronic cystitis without hematuria: Secondary | ICD-10-CM | POA: Diagnosis not present

## 2019-12-13 DIAGNOSIS — N3281 Overactive bladder: Secondary | ICD-10-CM

## 2019-12-13 LAB — URINALYSIS, ROUTINE W REFLEX MICROSCOPIC
Bilirubin, UA: NEGATIVE
Glucose, UA: NEGATIVE
Leukocytes,UA: NEGATIVE
Nitrite, UA: NEGATIVE
Protein,UA: NEGATIVE
RBC, UA: NEGATIVE
Specific Gravity, UA: 1.02 (ref 1.005–1.030)
Urobilinogen, Ur: 1 mg/dL (ref 0.2–1.0)
pH, UA: 5 (ref 5.0–7.5)

## 2019-12-13 NOTE — Patient Instructions (Addendum)
Overactive Bladder, Adult  Overactive bladder refers to a condition in which a person has a sudden need to pass urine. The person may leak urine if he or she cannot get to the bathroom fast enough (urinary incontinence). A person with this condition may also wake up several times in the night to go to the bathroom. Overactive bladder is associated with poor nerve signals between your bladder and your brain. Your bladder may get the signal to empty before it is full. You may also have very sensitive muscles that make your bladder squeeze too soon. These symptoms might interfere with daily work or social activities. What are the causes? This condition may be associated with or caused by:  Urinary tract infection.  Infection of nearby tissues, such as the prostate.  Prostate enlargement.  Surgery on the uterus or urethra.  Bladder stones, inflammation, or tumors.  Drinking too much caffeine or alcohol.  Certain medicines, especially medicines that get rid of extra fluid in the body (diuretics).  Muscle or nerve weakness, especially from: ? A spinal cord injury. ? Stroke. ? Multiple sclerosis. ? Parkinson's disease.  Diabetes.  Constipation. What increases the risk? You may be at greater risk for overactive bladder if you:  Are an older adult.  Smoke.  Are going through menopause.  Have prostate problems.  Have a neurological disease, such as stroke, dementia, Parkinson's disease, or multiple sclerosis (MS).  Eat or drink things that irritate the bladder. These include alcohol, spicy food, and caffeine.  Are overweight or obese. What are the signs or symptoms? Symptoms of this condition include:  Sudden, strong urge to urinate.  Leaking urine.  Urinating 8 or more times a day.  Waking up to urinate 2 or more times a night. How is this diagnosed? Your health care provider may suspect overactive bladder based on your symptoms. He or she will diagnose this condition  by:  A physical exam and medical history.  Blood or urine tests. You might need bladder or urine tests to help determine what is causing your overactive bladder. You might also need to see a health care provider who specializes in urinary tract problems (urologist). How is this treated? Treatment for overactive bladder depends on the cause of your condition and whether it is mild or severe. You can also make lifestyle changes at home. Options include:  Bladder training. This may include: ? Learning to control the urge to urinate by following a schedule that directs you to urinate at regular intervals (timed voiding). ? Doing Kegel exercises to strengthen your pelvic floor muscles, which support your bladder. Toning these muscles can help you control urination, even if your bladder muscles are overactive.  Special devices. This may include: ? Biofeedback, which uses sensors to help you become aware of your body's signals. ? Electrical stimulation, which uses electrodes placed inside the body (implanted) or outside the body. These electrodes send gentle pulses of electricity to strengthen the nerves or muscles that control the bladder. ? Women may use a plastic device that fits into the vagina and supports the bladder (pessary).  Medicines. ? Antibiotics to treat bladder infection. ? Antispasmodics to stop the bladder from releasing urine at the wrong time. ? Tricyclic antidepressants to relax bladder muscles. ? Injections of botulinum toxin type A directly into the bladder tissue to relax bladder muscles.  Lifestyle changes. This may include: ? Weight loss. Talk to your health care provider about weight loss methods that would work best for you. ?  Diet changes. This may include reducing how much alcohol and caffeine you consume, or drinking fluids at different times of the day. ? Not smoking. Do not use any products that contain nicotine or tobacco, such as cigarettes and e-cigarettes. If  you need help quitting, ask your health care provider.  Surgery. ? A device may be implanted to help manage the nerve signals that control urination. ? An electrode may be implanted to stimulate electrical signals in the bladder. ? A procedure may be done to change the shape of the bladder. This is done only in very severe cases. Follow these instructions at home: Lifestyle  Make any diet or lifestyle changes that are recommended by your health care provider. These may include: ? Drinking less fluid or drinking fluids at different times of the day. ? Cutting down on caffeine or alcohol. ? Doing Kegel exercises. ? Losing weight if needed. ? Eating a healthy and balanced diet to prevent constipation. This may include:  Eating foods that are high in fiber, such as fresh fruits and vegetables, whole grains, and beans.  Limiting foods that are high in fat and processed sugars, such as fried and sweet foods. General instructions  Take over-the-counter and prescription medicines only as told by your health care provider.  If you were prescribed an antibiotic medicine, take it as told by your health care provider. Do not stop taking the antibiotic even if you start to feel better.  Use any implants or pessary as told by your health care provider.  If needed, wear pads to absorb urine leakage.  Keep a journal or log to track how much and when you drink and when you feel the need to urinate. This will help your health care provider monitor your condition.  Keep all follow-up visits as told by your health care provider. This is important. Contact a health care provider if:  You have a fever.  Your symptoms do not get better with treatment.  Your pain and discomfort get worse.  You have more frequent urges to urinate. Get help right away if:  You are not able to control your bladder. Summary  Overactive bladder refers to a condition in which a person has a sudden need to pass  urine.  Several conditions may lead to an overactive bladder.  Treatment for overactive bladder depends on the cause and severity of your condition.  Follow your health care provider's instructions about lifestyle changes, doing Kegel exercises, keeping a journal, and taking medicines. This information is not intended to replace advice given to you by your health care provider. Make sure you discuss any questions you have with your health care provider. Document Revised: 08/11/2018 Document Reviewed: 05/06/2017 Elsevier Patient Education  [employer]. Urinary Tract Infection, Adult  A urinary tract infection (UTI) is an infection of any part of the urinary tract. The urinary tract includes the kidneys, ureters, bladder, and urethra. These organs make, store, and get rid of urine in the body. Your health care provider may use other names to describe the infection. An upper UTI affects the ureters and kidneys (pyelonephritis). A lower UTI affects the bladder (cystitis) and urethra (urethritis). What are the causes? Most urinary tract infections are caused by bacteria in your genital area, around the entrance to your urinary tract (urethra). These bacteria grow and cause inflammation of your urinary tract. What increases the risk? You are more likely to develop this condition if:  You have a urinary catheter that stays in  place (indwelling).  You are not able to control when you urinate or have a bowel movement (you have incontinence).  You are female and you: ? Use a spermicide or diaphragm for birth control. ? Have low estrogen levels. ? Are pregnant.  You have certain genes that increase your risk (genetics).  You are sexually active.  You take antibiotic medicines.  You have a condition that causes your flow of urine to slow down, such as: ? An enlarged prostate, if you are female. ? Blockage in your urethra (stricture). ? A kidney stone. ? A nerve condition that affects  your bladder control (neurogenic bladder). ? Not getting enough to drink, or not urinating often.  You have certain medical conditions, such as: ? Diabetes. ? A weak disease-fighting system (immunesystem). ? Sickle cell disease. ? Gout. ? Spinal cord injury. What are the signs or symptoms? Symptoms of this condition include:  Needing to urinate right away (urgently).  Frequent urination or passing small amounts of urine frequently.  Pain or burning with urination.  Blood in the urine.  Urine that smells bad or unusual.  Trouble urinating.  Cloudy urine.  Vaginal discharge, if you are female.  Pain in the abdomen or the lower back. You may also have:  Vomiting or a decreased appetite.  Confusion.  Irritability or tiredness.  A fever.  Diarrhea. The first symptom in older adults may be confusion. In some cases, they may not have any symptoms until the infection has worsened. How is this diagnosed? This condition is diagnosed based on your medical history and a physical exam. You may also have other tests, including:  Urine tests.  Blood tests.  Tests for sexually transmitted infections (STIs). If you have had more than one UTI, a cystoscopy or imaging studies may be done to determine the cause of the infections. How is this treated? Treatment for this condition includes:  Antibiotic medicine.  Over-the-counter medicines to treat discomfort.  Drinking enough water to stay hydrated. If you have frequent infections or have other conditions such as a kidney stone, you may need to see a health care provider who specializes in the urinary tract (urologist). In rare cases, urinary tract infections can cause sepsis. Sepsis is a life-threatening condition that occurs when the body responds to an infection. Sepsis is treated in the hospital with IV antibiotics, fluids, and other medicines. Follow these instructions at home:  Medicines  Take over-the-counter and  prescription medicines only as told by your health care provider.  If you were prescribed an antibiotic medicine, take it as told by your health care provider. Do not stop using the antibiotic even if you start to feel better. General instructions  Make sure you: ? Empty your bladder often and completely. Do not hold urine for long periods of time. ? Empty your bladder after sex. ? Wipe from front to back after a bowel movement if you are female. Use each tissue one time when you wipe.  Drink enough fluid to keep your urine pale yellow.  Keep all follow-up visits as told by your health care provider. This is important. Contact a health care provider if:  Your symptoms do not get better after 1-2 days.  Your symptoms go away and then return. Get help right away if you have:  Severe pain in your back or your lower abdomen.  A fever.  Nausea or vomiting. Summary  A urinary tract infection (UTI) is an infection of any part of the urinary tract,  which includes the kidneys, ureters, bladder, and urethra.  Most urinary tract infections are caused by bacteria in your genital area, around the entrance to your urinary tract (urethra).  Treatment for this condition often includes antibiotic medicines.  If you were prescribed an antibiotic medicine, take it as told by your health care provider. Do not stop using the antibiotic even if you start to feel better.  Keep all follow-up visits as told by your health care provider. This is important. This information is not intended to replace advice given to you by your health care provider. Make sure you discuss any questions you have with your health care provider. Document Revised: 04/07/2018 Document Reviewed: 10/28/2017 Elsevier Patient Education  2020 [employer].

## 2019-12-13 NOTE — Progress Notes (Signed)
Urological Symptom Review  Patient is experiencing the following symptoms: Get up at night to urinate Leakage of urine Urinary tract infection   Review of Systems  Gastrointestinal (upper)  : Negative for upper GI symptoms  Gastrointestinal (lower) : Negative for lower GI symptoms  Constitutional : Night Sweats Fatigue  Skin: Negative for skin symptoms  Eyes: Negative for eye symptoms  Ear/Nose/Throat : Sinus problems  Hematologic/Lymphatic: Negative for Hematologic/Lymphatic symptoms  Cardiovascular : Negative for cardiovascular symptoms  Respiratory : Cough  Endocrine: Negative for endocrine symptoms  Musculoskeletal: Back pain Joint pain  Neurological: Negative for neurological symptoms  Psychologic: Negative for psychiatric symptoms

## 2019-12-13 NOTE — Progress Notes (Signed)
12/13/2019 4:18 PM   Kimberly Huff Nov 07, 1945 [id number]  Referring provider: [name], MD [address] [place],  [place] [address]  Recurrent UTI  HPI: Ms Kimberly Huff is a 74yo here for followup for recurrent UTI. She underwent bladder biopsy in 09/2019 which showed inflammation. No UTI since last visit. She is on macrobid 13m daily She has issues with urgency and urge incontinence. She has nocturia 1-2x. She has never tried an OAB med  PMH: Past Medical History:  Diagnosis Date  . Allergy   . Atrophic kidney    left  . Blood clot in vein 45 yrs ago   inabdoment after surgery  . Blood transfusion without reported diagnosis   . Cataract   . Complication of anesthesia   . Coronary artery disease   . Coronary atherosclerosis   . Diabetes mellitus   . Diverticulosis 2005   Dr SJamal Collin . Dyspnea    with exertion  . External hemorrhoids without mention of complication 21025 . GERD (gastroesophageal reflux disease)   . H/O cardiac catheterization 2005  . Heart murmur 2012   found by Dr. SJamal Collin . Hyperlipidemia   . Hypertension   . Kidney stone   . Lesion of bladder   . [place]  . [place]rom[place] ([place]    "achy muscles"  . Obesity   . Osteoarthritis   . PONV (postoperative nausea and vomiting)   . Recurrent UTI   . Spinal stenosis of cervical region   . Steatohepatitis 05/30/10   Brunt Stage 3, non alcoholic cirrhosis of the liver  . Type II or unspecified type diabetes mellitus without mention of complication, uncontrolled    Patient does takes Metformin and recommended to stop for 48 hours.  .Marland KitchenUlcer   . Unspecified essential hypertension 2014    Surgical History: Past Surgical History:  Procedure Laterality Date  . ABDOMINAL HYSTERECTOMY  1982   complete  . BREAST CYST ASPIRATION Right 1990's   benign  . BREAST MASS EXCISION     benign  . CARDIAC CATHETERIZATION  2000   cardiac stent  . cardiac stents   2000  . CATARACT EXTRACTION     both eyes  . COLONOSCOPY  2004, 2015   Dr. SJamal Collin Dr. BOlevia Perches . CYSTOSCOPY W/ RETROGRADES Left 11/12/2015   Procedure: CYSTOSCOPY WITH RETROGRADE PYELOGRAM;  Surgeon: PCleon Gustin MD;  Location: [facility];  Service: Urology;  Laterality: Left;  . CYSTOSCOPY W/ URETERAL STENT PLACEMENT Left 11/12/2015   Procedure: CYSTOSCOPY WITH STENT REPLACEMENT;  Surgeon: PCleon Gustin MD;  Location: [facility];  Service: Urology;  Laterality: Left;  . CYSTOSCOPY WITH BIOPSY N/A 09/26/2019   Procedure: CYSTOSCOPY WITH BIOPSY AND FULGURATION;  Surgeon: MCleon Gustin MD;  [facility]  Service: Urology;  Laterality: N/A;  . CYSTOSCOPY WITH URETEROSCOPY AND STENT PLACEMENT Left 10/15/2015   Procedure: CYSTOSCOPY WITH URETEROSCOPY AND STENT PLACEMENT;  Surgeon: PCleon Gustin MD;  Location: [facility];  Service: Urology;  Laterality: Left;  . EYE SURGERY Bilateral 2012   cataract  . FOOT SURGERY  2008  . PTCA  2000   stent x 1  . ROBOTIC ASSITED PARTIAL NEPHRECTOMY Left 06/02/2019   Procedure: XI ROBOTIC ASSITED NEPHRECTOMY;  Surgeon: MCleon Gustin MD;  Location: [facility];  Service: Urology;  Laterality: Left;  . SALPINGOOPHORECTOMY    . TONSILLECTOMY    . UPPER GI ENDOSCOPY    .  URETEROSCOPY WITH HOLMIUM LASER LITHOTRIPSY Left 11/12/2015   Procedure: URETEROSCOPY WITH HOLMIUM LASER LITHOTRIPSY;  Surgeon: Cleon Gustin, MD;  Location: [facility];  Service: Urology;  Laterality: Left;  . URETEROTOMY  1960/1969   x 2 during childhood  . VAGINAL HYSTERECTOMY      Home Medications:  Allergies as of 12/13/2019   No Known Allergies     Medication List       Accurate as of December 13, 2019  4:18 PM. If you have any questions, ask your nurse or doctor.        aspirin 325 MG tablet Take 325 mg by mouth daily.   Cinnamon 500 MG capsule Take 500 mg by mouth daily. 1000 mg bid   ciprofloxacin 250 MG tablet Commonly known  as: CIPRO Take 1 tablet (250 mg total) by mouth 2 (two) times daily.   estradiol 0.1 MG/GM vaginal cream Commonly known as: ESTRACE APPLY A PEA SIZED AMOUNT TO YOUR URETHRA DAILY FOR 2 WEEKS THEN APPLY TWICE PER WEEK   fexofenadine 180 MG tablet Commonly known as: ALLEGRA Take 180 mg by mouth daily as needed for allergies or rhinitis.   furosemide 40 MG tablet Commonly known as: LASIX TAKE ONE TABLET EVERY DAY   glucose blood test strip Commonly known as: ONE TOUCH ULTRA TEST Use as instructed   ibuprofen 800 MG tablet Commonly known as: ADVIL Take 1 tablet (800 mg total) by mouth every other day. As needed for back pain   loperamide 2 MG tablet Commonly known as: IMODIUM A-D Take 4 mg by mouth 2 (two) times daily as needed for diarrhea or loose stools.   losartan 50 MG tablet Commonly known as: COZAAR TAKE ONE TABLET EVERY DAY   metFORMIN 850 MG tablet Commonly known as: GLUCOPHAGE TAKE ONE TABLET TWICE DAILY WITH A MEAL   nitrofurantoin 50 MG capsule Commonly known as: MACRODANTIN Take 50 mg by mouth daily.   omeprazole 40 MG capsule Commonly known as: PRILOSEC TAKE 1 CAPSULE BY MOUTH ONCE DAILY   ondansetron 4 MG tablet Commonly known as: ZOFRAN Take 4 mg by mouth every 6 (six) hours as needed.   rosuvastatin 10 MG tablet Commonly known as: CRESTOR TAKE ONE TABLET EVERY DAY   saccharomyces boulardii 250 MG capsule Commonly known as: FLORASTOR Take 250 mg by mouth daily.   senna-docusate 8.6-50 MG tablet Commonly known as: Senokot-S Take 2 tablets by mouth daily as needed for mild constipation.   traMADol 50 MG tablet Commonly known as: Ultram Take 1 tablet (50 mg total) by mouth every 6 (six) hours as needed.   Vitamin D (Ergocalciferol) 1.25 MG (50000 UNIT) Caps capsule Commonly known as: DRISDOL TAKE ONE CAPSULE EACH WEEK What changed: See the new instructions.       Allergies: No Known Allergies  Family History: Family History  Problem  Relation Age of Onset  . Lung cancer Father   . Heart disease Father   . Diverticulosis Mother   . Diabetes Mother   . Stroke Mother 59  . Autoimmune disease Daughter   . Thyroid disease Daughter   . Thyroid disease Sister   . Stomach cancer Maternal Aunt   . Breast cancer Maternal Aunt   . Colon cancer Neg Hx   . Esophageal cancer Neg Hx   . Rectal cancer Neg Hx     Social History:  reports that she quit smoking about 21 years ago. Her smoking use included cigarettes. She has a 19.00 pack-year  smoking history. She has never used smokeless tobacco. She reports previous alcohol use. She reports that she does not use drugs.  ROS: All other review of systems were reviewed and are negative except what is noted above in HPI  Physical Exam: BP 128/77   Pulse 99   Temp 98.1 F (36.7 C)   Ht 5' 3"  (1.6 m)   Wt 188 lb 6.4 oz (85.5 kg)   BMI 33.37 kg/m   Constitutional:  Alert and oriented, No acute distress. HEENT: [place] AT, moist mucus membranes.  Trachea midline, no masses. Cardiovascular: No clubbing, cyanosis, or edema. Respiratory: Normal respiratory effort, no increased work of breathing. GI: Abdomen is soft, nontender, nondistended, no abdominal masses GU: No CVA tenderness. Circumcised phallus. No masses/lesions on penis, testis, scrotum. Prostate 40g smooth no nodules no induration.  Lymph: No cervical or inguinal lymphadenopathy. Skin: No rashes, bruises or suspicious lesions. Neurologic: Grossly intact, no focal deficits, moving all 4 extremities. Psychiatric: Normal mood and affect.  Laboratory Data: Lab Results  Component Value Date   WBC 7.5 06/04/2019   HGB 14.3 09/26/2019   HCT 42.0 09/26/2019   MCV 87.7 06/04/2019   PLT 117 (L) 06/04/2019    Lab Results  Component Value Date   CREATININE 0.75 10/04/2019    No results found for: PSA  No results found for: TESTOSTERONE  Lab Results  Component Value Date   HGBA1C 6.8 (H) 10/04/2019    Urinalysis      Component Value Date/Time   COLORURINE YELLOW 10/06/2019 0941   APPEARANCEUR Clear 12/13/2019 1551   LABSPEC 1.020 10/06/2019 0941   PHURINE 7.0 10/06/2019 0941   GLUCOSEU Negative 12/13/2019 1551   GLUCOSEU NEGATIVE 10/06/2019 0941   HGBUR TRACE-INTACT (A) 10/06/2019 0941   BILIRUBINUR Negative 12/13/2019 1551   KETONESUR NEGATIVE 10/06/2019 0941   PROTEINUR Negative 12/13/2019 1551   PROTEINUR NEGATIVE 10/15/2015 1444   UROBILINOGEN 0.2 10/06/2019 0941   NITRITE Negative 12/13/2019 1551   NITRITE POSITIVE (A) 10/06/2019 0941   LEUKOCYTESUR Negative 12/13/2019 1551   LEUKOCYTESUR MODERATE (A) 10/06/2019 0941    Lab Results  Component Value Date   LABMICR Comment 12/13/2019   WBCUA 11-30 (A) 06/08/2016   RBCUA 0-2 06/08/2016   LABEPIT 0-10 06/08/2016   BACTERIA Many(>50/hpf) (A) 10/06/2019    Pertinent Imaging:  No results found for this or any previous visit.  No results found for this or any previous visit.  No results found for this or any previous visit.  No results found for this or any previous visit.  Results for orders placed during the hospital encounter of 06/05/16  US Renal  Narrative CLINICAL DATA:  Nephrolithiasis  EXAM: RENAL / URINARY TRACT ULTRASOUND COMPLETE  COMPARISON:  None.  FINDINGS: Right Kidney:  Length: 13.0 cm. Small extrarenal pelvis. Anechoic cyst in the lower pole. No hydronephrosis.  Left Kidney:  Length: 7.2 cm. Renal cortical thinning with lobular contour. No nephrolithiasis. No hydronephrosis  Bladder:  Appears normal for degree of bladder distention.  Liver is increased in echogenicity.  IMPRESSION: 1. Small LEFT kidney.  No obstruction identified. 2. Hepatic steatosis the liver.   Electronically Signed By: Suzy Bouchard M.D. On: 06/05/2016 16:33  No results found for this or any previous visit.  No results found for this or any previous visit.  Results for orders placed during the hospital  encounter of 03/13/19  CT RENAL STONE STUDY  Narrative CLINICAL DATA:  Left flank pain  EXAM: CT ABDOMEN AND PELVIS  WITHOUT CONTRAST  TECHNIQUE: Multidetector CT imaging of the abdomen and pelvis was performed following the standard protocol without IV contrast.  COMPARISON:  10/15/2015  FINDINGS: Lower chest: Coronary artery calcifications. No acute findings within the visualized lower chest.  Hepatobiliary: Subtle surface nodularity of the hepatic parenchyma. Subcentimeter area of ill-defined low density within the left hepatic lobe (series 2, image 25), not definitively seen on prior CT. Additional low-density subcentimeter lesion within the posterior aspect of the right hepatic lobe (series 2, image 27) appears unchanged from prior. Gallbladder appears unremarkable. No intrahepatic biliary dilatation.  Pancreas: Unremarkable. No pancreatic ductal dilatation or surrounding inflammatory changes.  Spleen: Normal in size without focal abnormality.  Adrenals/Urinary Tract: Unremarkable adrenal glands. Chronic atrophy of the left kidney without evidence of left-sided obstructive uropathy. Mildly prominent right extrarenal pelvis, unchanged in appearance from prior CT. Previously seen cystic lesion within the mid to lower pole of the right kidney has increased in size now measuring 3.3 cm in diameter (previously 1.9 cm) and now measures greater than simple fluid density. Urinary bladder is within normal limits for the degree of distension.  Stomach/Bowel: Stomach is within normal limits. Appendix appears normal. Extensive sigmoid diverticulosis. No evidence of bowel wall thickening, distention, or inflammatory changes.  Vascular/Lymphatic: Aortic atherosclerosis. No enlarged abdominal or pelvic lymph nodes.  Reproductive: Status post hysterectomy. No adnexal masses.  Other: There is a rounded 3.9 by 3.1 by 4.1 cm presacral soft tissue mass containing internal areas of  macroscopic fat (series 2, image 66; series 5, image 96). No underlying destruction or cortical regularity of the adjacent distal sacrum. This mass has increased in size from prior CT when it measured up to 3.7 cm. Small fat containing umbilical hernia. No ascites.  Musculoskeletal: Advanced multilevel lumbar spondylosis most severe at L1-2. There is grade 1 anterolisthesis L4 on L5. No acute osseous findings.  IMPRESSION: 1. Negative for obstructive uropathy. There is chronic atrophy of the left kidney. 2. 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 A low-grade retroperitoneal liposarcoma would be difficult to entirely exclude given slight interval increase in size from prior. Given the slow interval growth, a follow-up CT in 6-12 months could be considered to assess for stability. Alternatively, percutaneous biopsy could be considered for definitive diagnosis. 3. Slight surface nodularity of the hepatic parenchyma raising the possibility of cirrhosis. 4. Subcentimeter area of ill-defined low-density within the left hepatic lobe, not definitively seen on prior CT. Attention on follow-up is recommended. 5. Slight interval increase in size of cystic lesion within the mid to lower pole of the right kidney now measuring 3.3 cm in diameter (previously 1.9 cm) with greater than simple fluid density. This may represent a proteinaceous or hemorrhagic cyst. Consider renal ultrasound to further characterize. 6. Extensive sigmoid diverticulosis without acute diverticulitis.   Electronically Signed By: Davina Poke M.D. On: 03/13/2019 15:05   Assessment & Plan:    1. Recurrent UTI -Continue macrobid 35m daily -RTC 6 months - Urinalysis, Routine w reflex microscopic  2. OAB -We will trial mirabegron 271mdaily    No follow-ups on file.  PaNicolette BangMD  [facility] [place]

## 2019-12-20 ENCOUNTER — Ambulatory Visit: Payer: PPO | Admitting: Urology

## 2019-12-28 ENCOUNTER — Other Ambulatory Visit: Payer: Self-pay

## 2019-12-28 ENCOUNTER — Ambulatory Visit (INDEPENDENT_AMBULATORY_CARE_PROVIDER_SITE_OTHER): Payer: PPO

## 2019-12-28 DIAGNOSIS — M810 Age-related osteoporosis without current pathological fracture: Secondary | ICD-10-CM

## 2019-12-28 MED ORDER — DENOSUMAB 60 MG/ML ~~LOC~~ SOSY
60.0000 mg | PREFILLED_SYRINGE | Freq: Once | SUBCUTANEOUS | Status: AC
  Administered 2019-12-28: 60 mg via SUBCUTANEOUS

## 2019-12-28 NOTE — Progress Notes (Signed)
Patient presented for 22-monthProlia injection SQ to left arm. Patient tolerated well.

## 2020-01-13 ENCOUNTER — Other Ambulatory Visit: Payer: Self-pay | Admitting: Urology

## 2020-01-15 ENCOUNTER — Other Ambulatory Visit: Payer: Self-pay | Admitting: Internal Medicine

## 2020-01-17 ENCOUNTER — Telehealth: Payer: Self-pay | Admitting: Internal Medicine

## 2020-01-17 ENCOUNTER — Ambulatory Visit: Payer: PPO | Admitting: Urology

## 2020-01-17 DIAGNOSIS — R300 Dysuria: Secondary | ICD-10-CM

## 2020-01-17 NOTE — Telephone Encounter (Signed)
Yes,  Urine orderred

## 2020-01-17 NOTE — Telephone Encounter (Signed)
Patient is scheduled tomorrow to drop off urine. There are no available appointments this week. Anywhere you would like to work in patient?

## 2020-01-17 NOTE — Telephone Encounter (Signed)
The urine culture will not be back by Friday then,  So you can schedule her for Monday at 1:15.  If the culture confirms infection over the weekend,  I will send in the appropriate antibiotic

## 2020-01-17 NOTE — Addendum Note (Signed)
Addended by: [name] on: 01/17/2020 04:21 PM   Modules accepted: Orders

## 2020-01-17 NOTE — Telephone Encounter (Signed)
Is it okay for pt to drop off a urine and schedule an appt later in the week?

## 2020-01-17 NOTE — Telephone Encounter (Signed)
Pt called and wanted an appt today or to come in for a urine she thinks that she has a UTI Pt stated that she only has one kidney and had bladder surgery in May and knows something is not right

## 2020-01-18 ENCOUNTER — Other Ambulatory Visit: Payer: Self-pay

## 2020-01-18 ENCOUNTER — Other Ambulatory Visit (INDEPENDENT_AMBULATORY_CARE_PROVIDER_SITE_OTHER): Payer: PPO

## 2020-01-18 DIAGNOSIS — R300 Dysuria: Secondary | ICD-10-CM | POA: Diagnosis not present

## 2020-01-18 LAB — URINALYSIS, ROUTINE W REFLEX MICROSCOPIC
Bilirubin Urine: NEGATIVE
Hgb urine dipstick: NEGATIVE
Ketones, ur: NEGATIVE
Leukocytes,Ua: NEGATIVE
Nitrite: NEGATIVE
Specific Gravity, Urine: 1.02 (ref 1.000–1.030)
Total Protein, Urine: NEGATIVE
Urine Glucose: NEGATIVE
Urobilinogen, UA: 0.2 (ref 0.0–1.0)
pH: 5.5 (ref 5.0–8.0)

## 2020-01-18 NOTE — Telephone Encounter (Signed)
Spoke with pt and she has been scheduled for Monday the 20th at 1:15pm for a telephone visit.

## 2020-01-20 LAB — URINE CULTURE
MICRO NUMBER:: 10959106
SPECIMEN QUALITY:: ADEQUATE

## 2020-01-21 ENCOUNTER — Other Ambulatory Visit: Payer: Self-pay | Admitting: Internal Medicine

## 2020-01-21 ENCOUNTER — Encounter: Payer: Self-pay | Admitting: Internal Medicine

## 2020-01-21 DIAGNOSIS — N390 Urinary tract infection, site not specified: Secondary | ICD-10-CM

## 2020-01-21 DIAGNOSIS — B9620 Unspecified Escherichia coli [E. coli] as the cause of diseases classified elsewhere: Secondary | ICD-10-CM

## 2020-01-21 MED ORDER — CEFDINIR 300 MG PO CAPS
300.0000 mg | ORAL_CAPSULE | Freq: Two times a day (BID) | ORAL | 0 refills | Status: DC
Start: 2020-01-21 — End: 2020-02-05

## 2020-01-21 NOTE — Assessment & Plan Note (Signed)
Resistant to PCN, fluoroquinolones sulfa and macrobid.  Sensitive to 3rd and 4th generation cephalosporins.  Cefdinir 300 mg bid x 7 days prescribed and [facility]

## 2020-01-22 ENCOUNTER — Other Ambulatory Visit: Payer: Self-pay

## 2020-01-22 ENCOUNTER — Other Ambulatory Visit: Payer: Self-pay | Admitting: Internal Medicine

## 2020-01-22 ENCOUNTER — Telehealth (INDEPENDENT_AMBULATORY_CARE_PROVIDER_SITE_OTHER): Payer: PPO | Admitting: Internal Medicine

## 2020-01-22 ENCOUNTER — Encounter: Payer: Self-pay | Admitting: Internal Medicine

## 2020-01-22 VITALS — Ht 62.99 in | Wt 184.0 lb

## 2020-01-22 DIAGNOSIS — N261 Atrophy of kidney (terminal): Secondary | ICD-10-CM

## 2020-01-22 DIAGNOSIS — E1121 Type 2 diabetes mellitus with diabetic nephropathy: Secondary | ICD-10-CM

## 2020-01-22 DIAGNOSIS — M546 Pain in thoracic spine: Secondary | ICD-10-CM

## 2020-01-22 DIAGNOSIS — K7581 Nonalcoholic steatohepatitis (NASH): Secondary | ICD-10-CM | POA: Diagnosis not present

## 2020-01-22 DIAGNOSIS — G8929 Other chronic pain: Secondary | ICD-10-CM | POA: Insufficient documentation

## 2020-01-22 DIAGNOSIS — M549 Dorsalgia, unspecified: Secondary | ICD-10-CM | POA: Insufficient documentation

## 2020-01-22 MED ORDER — TRAMADOL HCL 50 MG PO TABS: 50.0000 mg | ORAL_TABLET | Freq: Every day | ORAL | 0 refills | Status: AC | PRN

## 2020-01-22 NOTE — Assessment & Plan Note (Signed)
Overdue for annual scan. Needs OV

## 2020-01-22 NOTE — Assessment & Plan Note (Signed)
Asured patient that contrary to my message,  She does NOT HAVE A UTI.  ADVISED TO SUSPEND CEFDINIR . However if she were to develop symptoms of UTI,  The E coli cultured from urine is resistant to macrobid (her daily suppressive ) so she would need to take the cefdinir.

## 2020-01-22 NOTE — Assessment & Plan Note (Signed)
S/p partial nephrectomy 2021

## 2020-01-22 NOTE — Progress Notes (Signed)
Telephone  Note  This visit type was conducted due to national recommendations for restrictions regarding the COVID-19 pandemic (e.g. social distancing).  This format is felt to be most appropriate for this patient at this time.  All issues noted in this document were discussed and addressed.  No physical exam was performed (except for noted visual exam findings with Video Visits).   I connected with@ on 01/22/20 at  1:15 PM EDT by  telephone and verified that I am speaking with the correct person using two identifiers. Location patient: home Location provider: work or home office Persons participating in the virtual visit: patient, provider  I discussed the limitations, risks, security and privacy concerns of performing an evaluation and management service by telephone and the availability of in person appointments. I also discussed with the patient that there may be a patient responsible charge related to this service. The patient expressed understanding and agreed to proceed.  Reason for visit: possible UTI ?  HPI:  74 yr old with recent left partial nephrectomy,  DJD joint disease of thoracolumbar spine presents for follow up on recent UA/culture .  Patient requested workup for right sided back pain that radiated to suprpubic area/groin.  Took tramadol for a few days,  Pain now resolved.   Recent cystoxcopy with biopsy in June showed inflammatory polyp .  Has not had dysuria or frequency. No nausea.  Drinks 60 ounces of water daily minimum.  Saw urology in [place].  ua normal  UA FROM LAST WEEK NORMAL.  Urine culture 50-100K E coli resistant to macrobid, amox, septra and cipro    ROS: See pertinent positives and negatives per HPI.  Past Medical History:  Diagnosis Date  . Allergy   . Atrophic kidney    left  . Blood clot in vein 45 yrs ago   inabdoment after surgery  . Blood transfusion without reported diagnosis   . Cataract   . Complication of anesthesia   . Coronary  artery disease   . Coronary atherosclerosis   . Diabetes mellitus   . Diverticulosis 2005   Dr Jamal Collin  . Dyspnea    with exertion  . External hemorrhoids without mention of complication 3845  . GERD (gastroesophageal reflux disease)   . H/O cardiac catheterization 2005  . Heart murmur 2012   found by Dr. Jamal Collin  . Hyperlipidemia   . Hypertension   . Kidney stone   . Lesion of bladder   . Myocardial infarction ([place]) 2000  . Neuromuscular disorder ([place])    "achy muscles"  . Obesity   . Osteoarthritis   . PONV (postoperative nausea and vomiting)   . Recurrent UTI   . Spinal stenosis of cervical region   . Steatohepatitis 05/30/10   Brunt Stage 3, non alcoholic cirrhosis of the liver  . Type II or unspecified type diabetes mellitus without mention of complication, uncontrolled    Patient does takes Metformin and recommended to stop for 48 hours.  Marland Kitchen Ulcer   . Unspecified essential hypertension 2014    Past Surgical History:  Procedure Laterality Date  . ABDOMINAL HYSTERECTOMY  1982   complete  . BREAST CYST ASPIRATION Right 1990's   benign  . BREAST MASS EXCISION     benign  . CARDIAC CATHETERIZATION  2000   cardiac stent  . cardiac stents  2000  . CATARACT EXTRACTION     both eyes  . COLONOSCOPY  2004, 2015   Dr. Jamal Collin, Dr. Olevia Perches  . CYSTOSCOPY  W/ RETROGRADES Left 11/12/2015   Procedure: CYSTOSCOPY WITH RETROGRADE PYELOGRAM;  Surgeon: Cleon Gustin, MD;  Location: [facility];  Service: Urology;  Laterality: Left;  . CYSTOSCOPY W/ URETERAL STENT PLACEMENT Left 11/12/2015   Procedure: CYSTOSCOPY WITH STENT REPLACEMENT;  Surgeon: Cleon Gustin, MD;  Location: [facility];  Service: Urology;  Laterality: Left;  . CYSTOSCOPY WITH BIOPSY N/A 09/26/2019   Procedure: CYSTOSCOPY WITH BIOPSY AND FULGURATION;  Surgeon: Cleon Gustin, MD;  Location: [facility];  Service: Urology;  Laterality: N/A;  . CYSTOSCOPY WITH URETEROSCOPY AND STENT PLACEMENT Left  10/15/2015   Procedure: CYSTOSCOPY WITH URETEROSCOPY AND STENT PLACEMENT;  Surgeon: Cleon Gustin, MD;  Location: [facility];  Service: Urology;  Laterality: Left;  . EYE SURGERY Bilateral 2012   cataract  . FOOT SURGERY  2008  . PTCA  2000   stent x 1  . ROBOTIC ASSITED PARTIAL NEPHRECTOMY Left 06/02/2019   Procedure: XI ROBOTIC ASSITED NEPHRECTOMY;  Surgeon: Cleon Gustin, MD;  [facility];  Service: Urology;  Laterality: Left;  . SALPINGOOPHORECTOMY    . TONSILLECTOMY    . UPPER GI ENDOSCOPY    . URETEROSCOPY WITH HOLMIUM LASER LITHOTRIPSY Left 11/12/2015   Procedure: URETEROSCOPY WITH HOLMIUM LASER LITHOTRIPSY;  Surgeon: Cleon Gustin, MD;  Location: [facility];  Service: Urology;  Laterality: Left;  . URETEROTOMY  1960/1969   x 2 during childhood  . VAGINAL HYSTERECTOMY      Family History  Problem Relation Age of Onset  . Lung cancer Father   . Heart disease Father   . Diverticulosis Mother   . Diabetes Mother   . Stroke Mother 80  . Autoimmune disease Daughter   . Thyroid disease Daughter   . Thyroid disease Sister   . Stomach cancer Maternal Aunt   . Breast cancer Maternal Aunt   . Colon cancer Neg Hx   . Esophageal cancer Neg Hx   . Rectal cancer Neg Hx     SOCIAL HX:  reports that she quit smoking about 21 years ago. Her smoking use included cigarettes. She has a 19.00 pack-year smoking history. She has never used smokeless tobacco. She reports previous alcohol use. She reports that she does not use drugs.   Current Outpatient Medications:  .  aspirin 325 MG tablet, Take 325 mg by mouth daily., Disp: , Rfl:  .  cefdinir (OMNICEF) 300 MG capsule, Take 1 capsule (300 mg total) by mouth 2 (two) times daily., Disp: 14 capsule, Rfl: 0 .  Cinnamon 500 MG capsule, Take 500 mg by mouth daily. 1000 mg bid, Disp: , Rfl:  .  furosemide (LASIX) 40 MG tablet, TAKE ONE TABLET EVERY DAY, Disp: 90 tablet, Rfl: 1 .  loperamide (IMODIUM A-D) 2 MG tablet, Take 4 mg by  mouth 2 (two) times daily as needed for diarrhea or loose stools., Disp: , Rfl:  .  losartan (COZAAR) 50 MG tablet, TAKE ONE TABLET EVERY DAY, Disp: 90 tablet, Rfl: 3 .  metFORMIN (GLUCOPHAGE) 850 MG tablet, TAKE ONE TABLET TWICE DAILY WITH A MEAL, Disp: 180 tablet, Rfl: 1 .  omeprazole (PRILOSEC) 40 MG capsule, TAKE 1 CAPSULE BY MOUTH ONCE DAILY, Disp: 90 capsule, Rfl: 1 .  senna-docusate (SENOKOT-S) 8.6-50 MG tablet, Take 2 tablets by mouth daily as needed for mild constipation., Disp: 30 tablet, Rfl: 1 .  Vitamin D, Ergocalciferol, (DRISDOL) 1.25 MG (50000 UNIT) CAPS capsule, TAKE ONE CAPSULE EACH WEEK (Patient taking differently: friday), Disp:  4 capsule, Rfl: 11 .  glucose blood (ONE TOUCH ULTRA TEST) test strip, Use as instructed (Patient not taking: Reported on 01/22/2020), Disp: 100 each, Rfl: 12 .  ibuprofen (ADVIL) 800 MG tablet, Take 1 tablet (800 mg total) by mouth every other day. As needed for back pain (Patient not taking: Reported on 01/22/2020), Disp: 45 tablet, Rfl: 1 .  rosuvastatin (CRESTOR) 10 MG tablet, TAKE ONE TABLET EVERY DAY, Disp: 90 tablet, Rfl: 1 .  traMADol (ULTRAM) 50 MG tablet, Take 1 tablet (50 mg total) by mouth every 6 (six) hours as needed., Disp: 15 tablet, Rfl: 0  EXAM:   General impression: alert, cooperative and articulate.  No signs of being in distress  Lungs: speech is fluent sentence length suggests that patient is not short of breath and not punctuated by cough, sneezing or sniffing. Marland Kitchen   Psych: affect normal.  speech is articulate and non pressured .  Denies suicidal thoughts    ASSESSMENT AND PLAN:  Discussed the following assessment and plan:  NASH (nonalcoholic steatohepatitis)  Atrophic kidney  Acute right-sided thoracic back pain  NASH (nonalcoholic steatohepatitis) Overdue for annual scan. Needs OV   Atrophic kidney S/p partial nephrectomy 2021   Back pain Asured patient that contrary to my message,  She does NOT HAVE A UTI.   ADVISED TO SUSPEND CEFDINIR . However if she were to develop symptoms of UTI,  The E coli cultured from urine is resistant to macrobid (her daily suppressive ) so she would need to take the cefdinir.     I discussed the assessment and treatment plan with the patient. The patient was provided an opportunity to ask questions and all were answered. The patient agreed with the plan and demonstrated an understanding of the instructions.   The patient was advised to call back or seek an in-person evaluation if the symptoms worsen or if the condition fails to improve as anticipated.  I provided 20 minutes of non-face-to-face time during this encounter.   [name], MD

## 2020-01-29 ENCOUNTER — Other Ambulatory Visit (INDEPENDENT_AMBULATORY_CARE_PROVIDER_SITE_OTHER): Payer: PPO

## 2020-01-29 ENCOUNTER — Other Ambulatory Visit: Payer: Self-pay

## 2020-01-29 DIAGNOSIS — K7581 Nonalcoholic steatohepatitis (NASH): Secondary | ICD-10-CM

## 2020-01-29 DIAGNOSIS — E1121 Type 2 diabetes mellitus with diabetic nephropathy: Secondary | ICD-10-CM

## 2020-01-29 LAB — COMPREHENSIVE METABOLIC PANEL
ALT: 25 U/L (ref 0–35)
AST: 19 U/L (ref 0–37)
Albumin: 4.3 g/dL (ref 3.5–5.2)
Alkaline Phosphatase: 43 U/L (ref 39–117)
BUN: 23 mg/dL (ref 6–23)
CO2: 29 mEq/L (ref 19–32)
Calcium: 9.4 mg/dL (ref 8.4–10.5)
Chloride: 101 mEq/L (ref 96–112)
Creatinine, Ser: 0.78 mg/dL (ref 0.40–1.20)
GFR: 71.72 mL/min (ref >60.00)
Glucose, Bld: 169 mg/dL — ABNORMAL HIGH (ref 70–99)
Potassium: 5.1 mEq/L (ref 3.5–5.1)
Sodium: 139 mEq/L (ref 135–145)
Total Bilirubin: 0.6 mg/dL (ref 0.2–1.2)
Total Protein: 6.7 g/dL (ref 6.0–8.3)

## 2020-01-29 LAB — LIPID PANEL
Cholesterol: 101 mg/dL (ref 0–200)
HDL: 25.2 mg/dL — ABNORMAL LOW (ref >39.00)
NonHDL: 75.99
Total CHOL/HDL Ratio: 4
Triglycerides: 201 mg/dL — ABNORMAL HIGH (ref 0.0–149.0)
VLDL: 40.2 mg/dL — ABNORMAL HIGH (ref 0.0–40.0)

## 2020-01-29 LAB — MICROALBUMIN / CREATININE URINE RATIO
Creatinine,U: 105.3 mg/dL
Microalb Creat Ratio: 3.6 mg/g (ref 0.0–30.0)
Microalb, Ur: 3.8 mg/dL — ABNORMAL HIGH (ref 0.0–1.9)

## 2020-01-29 LAB — LDL CHOLESTEROL, DIRECT: Direct LDL: 58 mg/dL

## 2020-01-29 LAB — HEMOGLOBIN A1C: Hgb A1c MFr Bld: 7.3 % — ABNORMAL HIGH (ref 4.6–6.5)

## 2020-02-02 ENCOUNTER — Encounter: Payer: Self-pay | Admitting: Emergency Medicine

## 2020-02-02 ENCOUNTER — Telehealth: Payer: Self-pay | Admitting: Internal Medicine

## 2020-02-02 ENCOUNTER — Emergency Department: Payer: PPO

## 2020-02-02 ENCOUNTER — Inpatient Hospital Stay: Payer: PPO

## 2020-02-02 ENCOUNTER — Other Ambulatory Visit: Payer: Self-pay

## 2020-02-02 ENCOUNTER — Inpatient Hospital Stay
Admission: EM | Admit: 2020-02-02 | Discharge: 2020-02-05 | DRG: 054 | Disposition: A | Discharge status: Home / Self Care | Payer: PPO | Attending: Internal Medicine | Admitting: Internal Medicine

## 2020-02-02 DIAGNOSIS — R569 Unspecified convulsions: Secondary | ICD-10-CM | POA: Diagnosis present

## 2020-02-02 DIAGNOSIS — Z803 Family history of malignant neoplasm of breast: Secondary | ICD-10-CM

## 2020-02-02 DIAGNOSIS — K5730 Diverticulosis of large intestine without perforation or abscess without bleeding: Secondary | ICD-10-CM | POA: Diagnosis not present

## 2020-02-02 DIAGNOSIS — J984 Other disorders of lung: Secondary | ICD-10-CM | POA: Diagnosis not present

## 2020-02-02 DIAGNOSIS — Z66 Do not resuscitate: Secondary | ICD-10-CM | POA: Diagnosis present

## 2020-02-02 DIAGNOSIS — K219 Gastro-esophageal reflux disease without esophagitis: Secondary | ICD-10-CM | POA: Diagnosis not present

## 2020-02-02 DIAGNOSIS — Z8349 Family history of other endocrine, nutritional and metabolic diseases: Secondary | ICD-10-CM | POA: Diagnosis not present

## 2020-02-02 DIAGNOSIS — G936 Cerebral edema: Secondary | ICD-10-CM | POA: Diagnosis present

## 2020-02-02 DIAGNOSIS — D1779 Benign lipomatous neoplasm of other sites: Secondary | ICD-10-CM | POA: Diagnosis not present

## 2020-02-02 DIAGNOSIS — Z79899 Other long term (current) drug therapy: Secondary | ICD-10-CM

## 2020-02-02 DIAGNOSIS — E669 Obesity, unspecified: Secondary | ICD-10-CM | POA: Diagnosis not present

## 2020-02-02 DIAGNOSIS — R4781 Slurred speech: Secondary | ICD-10-CM | POA: Diagnosis not present

## 2020-02-02 DIAGNOSIS — Z7982 Long term (current) use of aspirin: Secondary | ICD-10-CM

## 2020-02-02 DIAGNOSIS — Z8249 Family history of ischemic heart disease and other diseases of the circulatory system: Secondary | ICD-10-CM | POA: Diagnosis not present

## 2020-02-02 DIAGNOSIS — Z833 Family history of diabetes mellitus: Secondary | ICD-10-CM | POA: Diagnosis not present

## 2020-02-02 DIAGNOSIS — Z800 Family history of malignant neoplasm of digestive organs: Secondary | ICD-10-CM | POA: Diagnosis not present

## 2020-02-02 DIAGNOSIS — C3492 Malignant neoplasm of unspecified part of left bronchus or lung: Secondary | ICD-10-CM | POA: Diagnosis not present

## 2020-02-02 DIAGNOSIS — Z905 Acquired absence of kidney: Secondary | ICD-10-CM

## 2020-02-02 DIAGNOSIS — I2510 Atherosclerotic heart disease of native coronary artery without angina pectoris: Secondary | ICD-10-CM | POA: Diagnosis present

## 2020-02-02 DIAGNOSIS — E1136 Type 2 diabetes mellitus with diabetic cataract: Secondary | ICD-10-CM | POA: Diagnosis not present

## 2020-02-02 DIAGNOSIS — Z823 Family history of stroke: Secondary | ICD-10-CM

## 2020-02-02 DIAGNOSIS — D320 Benign neoplasm of cerebral meninges: Secondary | ICD-10-CM | POA: Diagnosis not present

## 2020-02-02 DIAGNOSIS — E785 Hyperlipidemia, unspecified: Secondary | ICD-10-CM | POA: Diagnosis present

## 2020-02-02 DIAGNOSIS — C719 Malignant neoplasm of brain, unspecified: Secondary | ICD-10-CM | POA: Diagnosis not present

## 2020-02-02 DIAGNOSIS — Z7984 Long term (current) use of oral hypoglycemic drugs: Secondary | ICD-10-CM

## 2020-02-02 DIAGNOSIS — Z20822 Contact with and (suspected) exposure to covid-19: Secondary | ICD-10-CM | POA: Diagnosis present

## 2020-02-02 DIAGNOSIS — G9389 Other specified disorders of brain: Secondary | ICD-10-CM | POA: Diagnosis not present

## 2020-02-02 DIAGNOSIS — I671 Cerebral aneurysm, nonruptured: Secondary | ICD-10-CM | POA: Diagnosis present

## 2020-02-02 DIAGNOSIS — Z6833 Body mass index (BMI) 33.0-33.9, adult: Secondary | ICD-10-CM | POA: Diagnosis not present

## 2020-02-02 DIAGNOSIS — R253 Fasciculation: Secondary | ICD-10-CM | POA: Diagnosis present

## 2020-02-02 DIAGNOSIS — N281 Cyst of kidney, acquired: Secondary | ICD-10-CM | POA: Diagnosis not present

## 2020-02-02 DIAGNOSIS — Z955 Presence of coronary angioplasty implant and graft: Secondary | ICD-10-CM

## 2020-02-02 DIAGNOSIS — I720 Aneurysm of carotid artery: Secondary | ICD-10-CM | POA: Diagnosis not present

## 2020-02-02 DIAGNOSIS — Z801 Family history of malignant neoplasm of trachea, bronchus and lung: Secondary | ICD-10-CM

## 2020-02-02 DIAGNOSIS — R220 Localized swelling, mass and lump, head: Secondary | ICD-10-CM | POA: Diagnosis not present

## 2020-02-02 DIAGNOSIS — I700 Atherosclerosis of aorta: Secondary | ICD-10-CM | POA: Diagnosis not present

## 2020-02-02 DIAGNOSIS — R29810 Facial weakness: Secondary | ICD-10-CM | POA: Diagnosis present

## 2020-02-02 DIAGNOSIS — E119 Type 2 diabetes mellitus without complications: Secondary | ICD-10-CM | POA: Diagnosis not present

## 2020-02-02 DIAGNOSIS — Z87891 Personal history of nicotine dependence: Secondary | ICD-10-CM

## 2020-02-02 DIAGNOSIS — C7931 Secondary malignant neoplasm of brain: Secondary | ICD-10-CM | POA: Diagnosis not present

## 2020-02-02 DIAGNOSIS — I10 Essential (primary) hypertension: Secondary | ICD-10-CM | POA: Diagnosis not present

## 2020-02-02 DIAGNOSIS — R490 Dysphonia: Secondary | ICD-10-CM | POA: Diagnosis present

## 2020-02-02 DIAGNOSIS — R4701 Aphasia: Secondary | ICD-10-CM | POA: Diagnosis not present

## 2020-02-02 DIAGNOSIS — I6782 Cerebral ischemia: Secondary | ICD-10-CM | POA: Diagnosis not present

## 2020-02-02 LAB — COMPREHENSIVE METABOLIC PANEL
ALT: 31 U/L (ref 0–44)
AST: 29 U/L (ref 15–41)
Albumin: 4.5 g/dL (ref 3.5–5.0)
Alkaline Phosphatase: 43 U/L (ref 38–126)
Anion gap: 10 (ref 5–15)
BUN: 16 mg/dL (ref 8–23)
CO2: 27 mmol/L (ref 22–32)
Calcium: 9.6 mg/dL (ref 8.9–10.3)
Chloride: 100 mmol/L (ref 98–111)
Creatinine, Ser: 0.81 mg/dL (ref 0.44–1.00)
GFR calc Af Amer: >60 mL/min (ref >60)
GFR calc non Af Amer: >60 mL/min (ref >60)
Glucose, Bld: 177 mg/dL — ABNORMAL HIGH (ref 70–99)
Potassium: 4.2 mmol/L (ref 3.5–5.1)
Sodium: 137 mmol/L (ref 135–145)
Total Bilirubin: 0.9 mg/dL (ref 0.3–1.2)
Total Protein: 7.7 g/dL (ref 6.5–8.1)

## 2020-02-02 LAB — RESPIRATORY PANEL BY RT PCR (FLU A&B, COVID)
Influenza A by PCR: NEGATIVE
Influenza B by PCR: NEGATIVE
SARS Coronavirus 2 by RT PCR: NEGATIVE

## 2020-02-02 LAB — DIFFERENTIAL
Abs Immature Granulocytes: 0.04 10*3/uL (ref 0.00–0.07)
Basophils Absolute: 0.1 10*3/uL (ref 0.0–0.1)
Basophils Relative: 1 %
Eosinophils Absolute: 0.7 10*3/uL — ABNORMAL HIGH (ref 0.0–0.5)
Eosinophils Relative: 9 %
Immature Granulocytes: 1 %
Lymphocytes Relative: 36 %
Lymphs Abs: 2.8 10*3/uL (ref 0.7–4.0)
Monocytes Absolute: 0.4 10*3/uL (ref 0.1–1.0)
Monocytes Relative: 6 %
Neutro Abs: 3.7 10*3/uL (ref 1.7–7.7)
Neutrophils Relative %: 47 %

## 2020-02-02 LAB — CBC
HCT: 40.9 % (ref 36.0–46.0)
Hemoglobin: 13 g/dL (ref 12.0–15.0)
MCH: 26.4 pg (ref 26.0–34.0)
MCHC: 31.8 g/dL (ref 30.0–36.0)
MCV: 83 fL (ref 80.0–100.0)
Platelets: 170 10*3/uL (ref 150–400)
RBC: 4.93 MIL/uL (ref 3.87–5.11)
RDW: 14.3 % (ref 11.5–15.5)
WBC: 7.7 10*3/uL (ref 4.0–10.5)
nRBC: 0 % (ref 0.0–0.2)

## 2020-02-02 LAB — PROTIME-INR
INR: 1.1 (ref 0.8–1.2)
Prothrombin Time: 13.3 seconds (ref 11.4–15.2)

## 2020-02-02 LAB — GLUCOSE, CAPILLARY
Glucose-Capillary: 169 mg/dL — ABNORMAL HIGH (ref 70–99)
Glucose-Capillary: 341 mg/dL — ABNORMAL HIGH (ref 70–99)

## 2020-02-02 LAB — APTT: aPTT: 27 seconds (ref 24–36)

## 2020-02-02 MED ORDER — TRAMADOL HCL 50 MG PO TABS: 50.0000 mg | ORAL_TABLET | Freq: Every day | ORAL | Status: DC | PRN

## 2020-02-02 MED ORDER — ROSUVASTATIN CALCIUM 10 MG PO TABS
10.0000 mg | ORAL_TABLET | Freq: Every day | ORAL | Status: DC
  Administered 2020-02-03 – 2020-02-04 (×2): 10 mg via ORAL
  Filled 2020-02-02 (×3): qty 1

## 2020-02-02 MED ORDER — ASPIRIN EC 325 MG PO TBEC
325.0000 mg | DELAYED_RELEASE_TABLET | Freq: Every day | ORAL | Status: DC
  Administered 2020-02-03 – 2020-02-04 (×2): 325 mg via ORAL
  Filled 2020-02-02 (×2): qty 1

## 2020-02-02 MED ORDER — LEVETIRACETAM IN NACL 1000 MG/100ML IV SOLN
1000.0000 mg | Freq: Once | INTRAVENOUS | Status: AC
  Administered 2020-02-02: 1000 mg via INTRAVENOUS
  Filled 2020-02-02: qty 100

## 2020-02-02 MED ORDER — SODIUM CHLORIDE 0.9% FLUSH: 3.0000 mL | Freq: Once | INTRAVENOUS | Status: DC

## 2020-02-02 MED ORDER — LOSARTAN POTASSIUM 50 MG PO TABS
50.0000 mg | ORAL_TABLET | Freq: Every day | ORAL | Status: DC
  Administered 2020-02-03 – 2020-02-05 (×3): 50 mg via ORAL
  Filled 2020-02-02 (×3): qty 1

## 2020-02-02 MED ORDER — DEXAMETHASONE SODIUM PHOSPHATE 10 MG/ML IJ SOLN
10.0000 mg | Freq: Once | INTRAMUSCULAR | Status: AC
  Administered 2020-02-02: 10 mg via INTRAVENOUS
  Filled 2020-02-02: qty 1

## 2020-02-02 MED ORDER — ENOXAPARIN SODIUM 40 MG/0.4ML ~~LOC~~ SOLN
40.0000 mg | SUBCUTANEOUS | Status: DC
  Administered 2020-02-02 – 2020-02-03 (×2): 40 mg via SUBCUTANEOUS
  Filled 2020-02-02 (×2): qty 0.4

## 2020-02-02 MED ORDER — INSULIN ASPART 100 UNIT/ML ~~LOC~~ SOLN
0.0000 [IU] | Freq: Three times a day (TID) | SUBCUTANEOUS | Status: DC
  Administered 2020-02-02: 11 [IU] via SUBCUTANEOUS
  Administered 2020-02-03: 8 [IU] via SUBCUTANEOUS
  Administered 2020-02-03: 5 [IU] via SUBCUTANEOUS
  Administered 2020-02-03: 11 [IU] via SUBCUTANEOUS
  Administered 2020-02-04: 5 [IU] via SUBCUTANEOUS
  Administered 2020-02-04: 3 [IU] via SUBCUTANEOUS
  Administered 2020-02-04: 11 [IU] via SUBCUTANEOUS
  Administered 2020-02-05: 3 [IU] via SUBCUTANEOUS
  Administered 2020-02-05: 2 [IU] via SUBCUTANEOUS
  Filled 2020-02-02 (×8): qty 1

## 2020-02-02 MED ORDER — ONDANSETRON HCL 4 MG/2ML IJ SOLN: 4.0000 mg | Freq: Four times a day (QID) | INTRAMUSCULAR | Status: DC | PRN

## 2020-02-02 MED ORDER — ONDANSETRON HCL 4 MG PO TABS: 4.0000 mg | ORAL_TABLET | Freq: Four times a day (QID) | ORAL | Status: DC | PRN

## 2020-02-02 MED ORDER — IOHEXOL 9 MG/ML PO SOLN
500.0000 mL | ORAL | Status: AC
  Administered 2020-02-02: 500 mL via ORAL

## 2020-02-02 MED ORDER — LEVETIRACETAM 500 MG PO TABS
500.0000 mg | ORAL_TABLET | Freq: Two times a day (BID) | ORAL | Status: DC
  Administered 2020-02-02 – 2020-02-05 (×6): 500 mg via ORAL
  Filled 2020-02-02 (×6): qty 1

## 2020-02-02 MED ORDER — PANTOPRAZOLE SODIUM 40 MG PO TBEC
40.0000 mg | DELAYED_RELEASE_TABLET | Freq: Every day | ORAL | Status: DC
  Administered 2020-02-03 – 2020-02-05 (×3): 40 mg via ORAL
  Filled 2020-02-02 (×3): qty 1

## 2020-02-02 MED ORDER — FUROSEMIDE 40 MG PO TABS
40.0000 mg | ORAL_TABLET | Freq: Every day | ORAL | Status: DC
  Administered 2020-02-05: 40 mg via ORAL
  Filled 2020-02-02 (×3): qty 1

## 2020-02-02 MED ORDER — VITAMIN D (ERGOCALCIFEROL) 1.25 MG (50000 UNIT) PO CAPS: 50000.0000 [IU] | ORAL_CAPSULE | ORAL | Status: DC

## 2020-02-02 MED ORDER — DEXAMETHASONE SODIUM PHOSPHATE 4 MG/ML IJ SOLN
4.0000 mg | Freq: Four times a day (QID) | INTRAMUSCULAR | Status: DC
  Administered 2020-02-02 – 2020-02-03 (×4): 4 mg via INTRAVENOUS
  Filled 2020-02-02 (×5): qty 1

## 2020-02-02 MED ORDER — GADOBUTROL 1 MMOL/ML IV SOLN
9.0000 mL | Freq: Once | INTRAVENOUS | Status: AC | PRN
  Administered 2020-02-02: 9 mL via INTRAVENOUS

## 2020-02-02 MED ORDER — SODIUM CHLORIDE 0.9 % IV SOLN: INTRAVENOUS | Status: DC

## 2020-02-02 MED ORDER — IOHEXOL 300 MG/ML  SOLN
100.0000 mL | Freq: Once | INTRAMUSCULAR | Status: AC | PRN
  Administered 2020-02-02: 100 mL via INTRAVENOUS

## 2020-02-02 MED ORDER — SENNOSIDES-DOCUSATE SODIUM 8.6-50 MG PO TABS: 2.0000 | ORAL_TABLET | Freq: Every day | ORAL | Status: DC | PRN

## 2020-02-02 NOTE — ED Notes (Signed)
Pt taken to MRI  

## 2020-02-02 NOTE — H&P (Addendum)
History and Physical    Kimberly Huff DOB: February 08, 1946 DOA: 02/02/2020  PCP: [name], MD   Patient coming from: Home  I have personally briefly reviewed patient's old medical records in [place]  Chief Complaint: Slurred speech  HPI: Kimberly Huff is a 74 y.o. female with medical history significant for diabetes mellitus, coronary artery disease, dyslipidemia and GERD who was brought into the emergency room by his sister for evaluation of a transient 5-minute episode of slurred speech and left facial weakness.  Patient states that she tried to drink water but was unable to on her left side and spilled the water.  Symptoms occurred at about 8:19 AM.  She also describes twitching involving the left side of her face lasting about 5 minutes.  Patient states that she had a similar episode the day prior but this resolved.  Patient was concerned that she may be having a stroke and so called her sister who brought her to the emergency room for evaluation.  She arrived to the ER and a code stroke was called. She denies having any chest pain, no shortness of breath no nausea, no vomiting, no dizziness, no lightheadedness, no abdominal pain or any changes in her bowel habits.  She denies having any urinary symptoms. Patient had a left sided nephrectomy done earlier this year for segmental glomerulosclerosis and chronic pyelonephritis.  Pathology was negative for malignancy. Patient is also status post biopsy of a bladder lesion and pathology was negative for malignancy as well. Labs show sodium 141, potassium 4.7, chloride 105, bicarb 29, BUN 21, creatinine 0.75, phosphatase 43, AST 12, ALT 15, total protein 6.6, white count 7.7, hemoglobin 13, hematocrit 40.9, MCV 83, RDW 14.3, platelet count 170. CT scan of the head without contrast shows two areas of edema in the right cerebral hemisphere. Although both are located in the MCA distribution, these have the appearance of  vasogenic edema - more or worrisome for mass lesion than infarct. Recommend brain MRI with contrast. MRI of the brain with and without contrast showed two enhancing mass lesions are present in the right cerebral hemisphere with surrounding edema and central necrosis compatible with metastatic disease. No midline shift. Mild chronic microvascular ischemic change in the white matter. No acute infarct. CT angiogram showed no intracranial stenosis or occlusion. 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 Twelve-lead EKG reviewed by me shows sinus rhythm with nonspecific T wave changes    ED Course: Patient is a 74 year old Caucasian female who presented to the emergency room as a code stroke.  She developed left-sided facial weakness and slurred speech which has completely resolved.  Imaging showed areas of edema in the right cerebral hemisphere located in the MCA distribution suggestive of vasogenic edema and an MRI was recommended.  MRI of the brain done with IV contrast shows 2 enhancing mass lesions in the right cerebral hemisphere with surrounding edema and central necrosis compatible with metastatic disease.  Patient will be admitted to the hospital for further evaluation.    Review of Systems: As per HPI otherwise 10 point review of systems negative.    Past Medical History:  Diagnosis Date  . Allergy   . Atrophic kidney    left  . Blood clot in vein 45 yrs ago   inabdoment after surgery  . Blood transfusion without reported diagnosis   . Cataract   . Complication of anesthesia   . Coronary artery disease   . Coronary atherosclerosis   .  Diabetes mellitus   . Diverticulosis 2005   Dr Jamal Collin  . Dyspnea    with exertion  . External hemorrhoids without mention of complication 8469  . GERD (gastroesophageal reflux disease)   . H/O cardiac catheterization 2005  . Heart murmur 2012   found by Dr. Jamal Collin  . Hyperlipidemia   . Hypertension   . Kidney stone   . Lesion of bladder    . Myocardial infarction ([place]) 2000  . Neuromuscular disorder ([place])    "achy muscles"  . Obesity   . Osteoarthritis   . PONV (postoperative nausea and vomiting)   . Recurrent UTI   . Spinal stenosis of cervical region   . Steatohepatitis 05/30/10   Brunt Stage 3, non alcoholic cirrhosis of the liver  . Type II or unspecified type diabetes mellitus without mention of complication, uncontrolled    Patient does takes Metformin and recommended to stop for 48 hours.  Marland Kitchen Ulcer   . Unspecified essential hypertension 2014    Past Surgical History:  Procedure Laterality Date  . ABDOMINAL HYSTERECTOMY  1982   complete  . BREAST CYST ASPIRATION Right 1990's   benign  . BREAST MASS EXCISION     benign  . CARDIAC CATHETERIZATION  2000   cardiac stent  . cardiac stents  2000  . CATARACT EXTRACTION     both eyes  . COLONOSCOPY  2004, 2015   Dr. Jamal Collin, Dr. Olevia Perches  . CYSTOSCOPY W/ RETROGRADES Left 11/12/2015   Procedure: CYSTOSCOPY WITH RETROGRADE PYELOGRAM;  Surgeon: Cleon Gustin, MD;  Location: [facility];  Service: Urology;  Laterality: Left;  . CYSTOSCOPY W/ URETERAL STENT PLACEMENT Left 11/12/2015   Procedure: CYSTOSCOPY WITH STENT REPLACEMENT;  Surgeon: Cleon Gustin, MD;  Location: [facility];  Service: Urology;  Laterality: Left;  . CYSTOSCOPY WITH BIOPSY N/A 09/26/2019   Procedure: CYSTOSCOPY WITH BIOPSY AND FULGURATION;  Surgeon: Cleon Gustin, MD;  Location: [facility];  Service: Urology;  Laterality: N/A;  . CYSTOSCOPY WITH URETEROSCOPY AND STENT PLACEMENT Left 10/15/2015   Procedure: CYSTOSCOPY WITH URETEROSCOPY AND STENT PLACEMENT;  Surgeon: Cleon Gustin, MD;  Location: [facility];  Service: Urology;  Laterality: Left;  . EYE SURGERY Bilateral 2012   cataract  . FOOT SURGERY  2008  . PTCA  2000   stent x 1  . ROBOTIC ASSITED PARTIAL NEPHRECTOMY Left 06/02/2019   Procedure: XI ROBOTIC ASSITED NEPHRECTOMY;  Surgeon: Cleon Gustin, MD;   Location: [facility];  Service: Urology;  Laterality: Left;  . SALPINGOOPHORECTOMY    . TONSILLECTOMY    . UPPER GI ENDOSCOPY    . URETEROSCOPY WITH HOLMIUM LASER LITHOTRIPSY Left 11/12/2015   Procedure: URETEROSCOPY WITH HOLMIUM LASER LITHOTRIPSY;  Surgeon: Cleon Gustin, MD;  Location: [facility];  Service: Urology;  Laterality: Left;  . URETEROTOMY  1960/1969   x 2 during childhood  . VAGINAL HYSTERECTOMY       reports that she quit smoking about 21 years ago. Her smoking use included cigarettes. She has a 19.00 pack-year smoking history. She has never used smokeless tobacco. She reports previous alcohol use. She reports that she does not use drugs.  No Known Allergies  Family History  Problem Relation Age of Onset  . Lung cancer Father   . Heart disease Father   . Diverticulosis Mother   . Diabetes Mother   . Stroke Mother 68  . Autoimmune disease Daughter   . Thyroid disease Daughter   . Thyroid  disease Sister   . Stomach cancer Maternal Aunt   . Breast cancer Maternal Aunt   . Colon cancer Neg Hx   . Esophageal cancer Neg Hx   . Rectal cancer Neg Hx      Prior to Admission medications   Medication Sig Start Date End Date Taking? Authorizing Provider  aspirin 325 MG tablet Take 325 mg by mouth daily.   Yes [provider]  Cinnamon 500 MG capsule Take 500 mg by mouth daily. 1000 mg bid   Yes [provider]  furosemide (LASIX) 40 MG tablet TAKE ONE TABLET EVERY DAY Patient taking differently: Take 40 mg by mouth daily.  10/11/19  Yes [name], MD  losartan (COZAAR) 50 MG tablet TAKE ONE TABLET EVERY DAY Patient taking differently: Take 50 mg by mouth daily.  01/15/20  Yes [name], MD  metFORMIN (GLUCOPHAGE) 850 MG tablet TAKE ONE TABLET TWICE DAILY WITH A MEAL Patient taking differently: Take 850 mg by mouth daily with breakfast.  07/12/19  Yes McLean-Scocuzza, Nino Glow, MD  nitrofurantoin (MACRODANTIN) 50 MG capsule Take 50 mg by mouth daily.    Yes [provider]  omeprazole (PRILOSEC) 40 MG capsule TAKE 1 CAPSULE BY MOUTH ONCE DAILY Patient taking differently: Take 40 mg by mouth daily.  11/08/19  Yes [name], MD  rosuvastatin (CRESTOR) 10 MG tablet TAKE ONE TABLET EVERY DAY Patient taking differently: Take 10 mg by mouth daily.  10/11/19  Yes [name], MD  traMADol (ULTRAM) 50 MG tablet Take 1 tablet (50 mg total) by mouth daily as needed. 01/22/20 02/21/20 Yes [name], MD  Vitamin D, Ergocalciferol, (DRISDOL) 1.25 MG (50000 UNIT) CAPS capsule TAKE ONE CAPSULE EACH WEEK Patient taking differently: Take 50,000 Units by mouth every 7 (seven) days. Friday 08/24/19  Yes [name], MD  cefdinir (OMNICEF) 300 MG capsule Take 1 capsule (300 mg total) by mouth 2 (two) times daily. Patient not taking: Reported on 02/02/2020 01/21/20   [name], MD  glucose blood (ONE TOUCH ULTRA TEST) test strip Use as instructed Patient not taking: Reported on 01/22/2020 02/21/18   [name], MD  ibuprofen (ADVIL) 800 MG tablet Take 1 tablet (800 mg total) by mouth every other day. As needed for back pain Patient not taking: Reported on 01/22/2020 10/29/19   [name], MD  loperamide (IMODIUM A-D) 2 MG tablet Take 4 mg by mouth 2 (two) times daily as needed for diarrhea or loose stools.    [provider]  senna-docusate (SENOKOT-S) 8.6-50 MG tablet Take 2 tablets by mouth daily as needed for mild constipation. 06/05/19 06/04/20  Cleon Gustin, MD    Physical Exam: Vitals:   02/02/20 0900 02/02/20 0927 02/02/20 0935 02/02/20 1124  BP:   (!) 169/71 (!) 152/78  Pulse:   78 77  Resp:   16 15  Temp:   98.7 F (37.1 C)   TempSrc:   Oral   SpO2:   96% 95%  Weight: 86.2 kg 86.2 kg    Height:  5' 3"  (1.6 m)       Vitals:   02/02/20 0900 02/02/20 0927 02/02/20 0935 02/02/20 1124  BP:   (!) 169/71 (!) 152/78  Pulse:   78 77  Resp:   16 15  Temp:   98.7 F (37.1 C)   TempSrc:   Oral   SpO2:    96% 95%  Weight: 86.2 kg 86.2  kg    Height:  5' 3"  (1.6 m)      Constitutional: NAD, alert and oriented x 3.  Eyes: PERRL, lids and conjunctivae normal ENMT: Mucous membranes are moist.  Neck: normal, supple, no masses, no thyromegaly Respiratory: clear to auscultation bilaterally, no wheezing, no crackles. Normal respiratory effort. No accessory muscle use.  Cardiovascular: Regular rate and rhythm, no murmurs / rubs / gallops. No extremity edema. 2+ pedal pulses. No carotid bruits.  Abdomen: no tenderness, no masses palpated. No hepatosplenomegaly. Bowel sounds positive.  Musculoskeletal: no clubbing / cyanosis. No joint deformity upper and lower extremities.  Skin: no rashes, lesions, ulcers.  Neurologic: No gross focal neurologic deficit.  Able to move all extremities Psychiatric: Normal mood and affect.   Labs on Admission: I have personally reviewed following labs and imaging studies  CBC: Recent Labs  Lab 02/02/20 0925  WBC 7.7  NEUTROABS 3.7  HGB 13.0  HCT 40.9  MCV 83.0  PLT 073   Basic Metabolic Panel: Recent Labs  Lab 01/29/20 0856 02/02/20 0925  NA 139 137  K 5.1 4.2  CL 101 100  CO2 29 27  GLUCOSE 169* 177*  BUN 23 16  CREATININE 0.78 0.81  CALCIUM 9.4 9.6   GFR: Estimated Creatinine Clearance: 61.5 mL/min (by C-G formula based on SCr of 0.81 mg/dL). Liver Function Tests: Recent Labs  Lab 01/29/20 0856 02/02/20 0925  AST 19 29  ALT 25 31  ALKPHOS 43 43  BILITOT 0.6 0.9  PROT 6.7 7.7  ALBUMIN 4.3 4.5   No results for input(s): LIPASE, AMYLASE in the last 168 hours. No results for input(s): AMMONIA in the last 168 hours. Coagulation Profile: Recent Labs  Lab 02/02/20 0925  INR 1.1   Cardiac Enzymes: No results for input(s): CKTOTAL, CKMB, CKMBINDEX, TROPONINI in the last 168 hours. BNP (last 3 results) No results for input(s): PROBNP in the last 8760 hours. HbA1C: No results for input(s): HGBA1C in the last 72 hours. CBG: Recent  Labs  Lab 02/02/20 0920  GLUCAP 169*   Lipid Profile: No results for input(s): CHOL, HDL, LDLCALC, TRIG, CHOLHDL, LDLDIRECT in the last 72 hours. Thyroid Function Tests: No results for input(s): TSH, T4TOTAL, FREET4, T3FREE, THYROIDAB in the last 72 hours. Anemia Panel: No results for input(s): VITAMINB12, FOLATE, FERRITIN, TIBC, IRON, RETICCTPCT in the last 72 hours. Urine analysis:    Component Value Date/Time   COLORURINE YELLOW 01/18/2020 1042   APPEARANCEUR CLEAR 01/18/2020 1042   APPEARANCEUR Clear 12/13/2019 1551   LABSPEC 1.020 01/18/2020 1042   PHURINE 5.5 01/18/2020 1042   GLUCOSEU NEGATIVE 01/18/2020 1042   HGBUR NEGATIVE 01/18/2020 1042   BILIRUBINUR NEGATIVE 01/18/2020 1042   BILIRUBINUR Negative 12/13/2019 [place] 01/18/2020 1042   PROTEINUR Negative 12/13/2019 1551   PROTEINUR NEGATIVE 10/15/2015 1444   UROBILINOGEN 0.2 01/18/2020 1042   NITRITE NEGATIVE 01/18/2020 1042   LEUKOCYTESUR NEGATIVE 01/18/2020 1042    Radiological Exams on Admission: MR ANGIO HEAD WO CONTRAST  Result Date: 02/02/2020 CLINICAL DATA:  Seizure. EXAM: MRA HEAD WITHOUT CONTRAST TECHNIQUE: Angiographic images of the Circle of Willis were obtained using MRA technique without intravenous contrast. COMPARISON:  MRI head 02/02/2020 FINDINGS: Left vertebral artery dominant. Both vertebral arteries patent to the basilar without stenosis. Left PICA patent. Probable small right PICA. Small right AICA patent. Basilar widely patent. Superior cerebellar and posterior cerebral arteries patent without stenosis. Lobulated aneurysm right cavernous carotid in the Peri ophthalmic region. This measures approximately 6  x 7 mm. Cavernous carotid is patent bilaterally without stenosis Anterior and middle cerebral arteries patent bilaterally without stenosis or additional aneurysm. 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 Electronically Signed    By: Franchot Gallo M.D.   On: 02/02/2020 11:28   MR ANGIO NECK W WO CONTRAST  Result Date: 02/02/2020 CLINICAL DATA:  Seizure. EXAM: MRA NECK WITHOUT AND WITH CONTRAST TECHNIQUE: Multiplanar and multiecho pulse sequences of the neck were obtained without and with intravenous contrast. Angiographic images of the neck were obtained using MRA technique without and with intravenous contrast. CONTRAST:  51m GADAVIST GADOBUTROL 1 MMOL/ML IV SOLN COMPARISON:  None. FINDINGS: Normal aortic arch. Proximal great vessels widely patent without stenosis. Carotid bifurcation is normal bilaterally. No significant stenosis or irregularity Antegrade flow in both vertebral arteries. Left vertebral artery dominant. No vertebral artery stenosis. Aneurysm right cavernous carotid. IMPRESSION: Negative MRA neck. Aneurysm right cavernous carotid. Electronically Signed   By: CFranchot GalloM.D.   On: 02/02/2020 11:25   MR BRAIN W WO CONTRAST  Result Date: 02/02/2020 CLINICAL DATA:  Seizure.  Abnormal CT head. EXAM: MRI HEAD WITHOUT AND WITH CONTRAST TECHNIQUE: Multiplanar, multiecho pulse sequences of the brain and surrounding structures were obtained without and with intravenous contrast. CONTRAST:  932mGADAVIST GADOBUTROL 1 MMOL/ML IV SOLN COMPARISON:  CT head 02/02/2020 FINDINGS: Brain: Peripheral enhancing mass lesion in the right middle frontal lobe laterally with central necrosis and surrounding edema. The enhancing mass measures up to 15 mm in diameter. This is peripherally located. Second peripherally enhancing mass lesion in the right posterior temporal lobe measures 9 mm in diameter with surrounding vasogenic edema. No third lesion identified. Negative for acute infarct. Scattered small white matter hyperintensities compatible with mild chronic microvascular ischemia. Negative for hydrocephalus or hemorrhage. Vascular: Normal arterial flow voids Skull and upper cervical spine: No focal skeletal lesion. Sinuses/Orbits:  Mild mucosal edema paranasal sinuses. Bilateral cataract extraction Other: None IMPRESSION: Two enhancing mass lesions are present in the right cerebral hemisphere with surrounding edema and central necrosis compatible with metastatic disease. No midline shift Mild chronic microvascular ischemic change in the white matter. No acute infarct. Electronically Signed   By: ChFranchot Gallo.D.   On: 02/02/2020 11:22   CT HEAD CODE STROKE WO CONTRAST  Result Date: 02/02/2020 CLINICAL DATA:  Code stroke. Slurred speech and left-sided numbness. EXAM: CT HEAD WITHOUT CONTRAST TECHNIQUE: Contiguous axial images were obtained from the base of the skull through the vertex without intravenous contrast. COMPARISON:  None. FINDINGS: Brain: 2 areas moderate edema in the right cerebral hemisphere, lateral frontal and temporoparietal. Although both in the MCA distribution these show preserved cortex, more suggestive of vasogenic than cytotoxic edema. No acute hemorrhage, hydrocephalus, or shift. Vascular: No hyperdense vessel. Skull: Normal. Negative for fracture or focal lesion. Sinuses/Orbits: Bilateral cataract resection Other: Critical Value/emergent results were called by telephone at the time of interpretation on 02/02/2020 at 9:16 am to provider PAMerlyn Lot who verbally acknowledged these results. ASPECTS ([facility] Early CT Score) Not scored given the above IMPRESSION: Two areas of edema in the right cerebral hemisphere. Although both are located in the MCA distribution, these have the appearance of vasogenic edema - more or worrisome for mass lesion than infarct. Recommend brain MRI with contrast. Electronically Signed   By: JoMonte Fantasia.D.   On: 02/02/2020 09:19    EKG: Independently reviewed.  Sinus rhythm with nonspecific T wave changes  Assessment/Plan Principal Problem:   Brain mass Active Problems:   Obesity (BMI 30-39.9)   Benign essential HTN   Diabetes mellitus ([place])   Obesity  (BMI 30.0-34.9)     Brain mass ??  Metastatic disease with unknown primary Patient noted to have 2 ring enhancing lesions in the right hemisphere with vasogenic edema We will start patient on Decadron 4 mg IV every 6 We will request oncology consult Patient was loaded with Keppra in the emergency room for seizure prophylaxis Continue Keppra 500 mg twice daily   Obesity Complicates overall prognosis and care   Diabetes mellitus Maintain consistent carbohydrate diet Hold Metformin Place patient on sliding scale coverage   Hypertension Continue Cozaar   DVT prophylaxis: Lovenox Code Status: DO NOT RESUSCITATE Family Communication: Greater than 50% of time was spent discussing patient's condition and plan of care with her and her daughter at the bedside.  All questions and concerns have been addressed.  CODE STATUS was discussed and she wishes to be a DO NOT RESUSCITATE Disposition Plan: Back to previous home environment Consults called: Oncology, Neurology    Collier Bullock MD [facility]     02/02/2020, 2:33 PM

## 2020-02-02 NOTE — ED Provider Notes (Signed)
[facility] Emergency Department Provider Note  ____________________________________________   First MD Initiated Contact with Patient 02/02/20 [phone]     (approximate)  I have reviewed the triage vital signs and the nursing notes.   HISTORY  Chief Complaint Aphasia and Weakness    HPI Kimberly Huff is a 74 y.o. female  With h/o CAD, DM, HTN, HLD, here with altered mental status.    Patient states that her symptoms started yesterday.  While she was out with her grandson, she states she noticed that she began to have difficulty speaking as well as a sensation that her left face was twitching.  She felt like she had difficulty keeping her mouth closed.  This lasted only several minutes.  She got out of the car with her grandson, who made her raise her hands overhead, and she was able to do this, and her symptoms then resolved.  She then spent the rest of the day without any recurrence.  This morning, she had recurrence of sensation like she could not chew properly or close her mouth completely.  She had some subjective facial twitching it was well.  No focal numbness or weakness.  No headache.  Denies any history of similar episodes.  Denies any known stroke history.  She did take a full dose aspirin yesterday as well as 2 full doses today at the onset of symptoms.  She reports a family history of stroke, but no personal history.  She is not on blood thinners.  No other complaints.       Past Medical History:  Diagnosis Date  . Allergy   . Atrophic kidney    left  . Blood clot in vein 45 yrs ago   inabdoment after surgery  . Blood transfusion without reported diagnosis   . Cataract   . Complication of anesthesia   . Coronary artery disease   . Coronary atherosclerosis   . Diabetes mellitus   . Diverticulosis 2005   Dr Jamal Collin  . Dyspnea    with exertion  . External hemorrhoids without mention of complication 6222  . GERD (gastroesophageal reflux disease)    . H/O cardiac catheterization 2005  . Heart murmur 2012   found by Dr. Jamal Collin  . Hyperlipidemia   . Hypertension   . Kidney stone   . Lesion of bladder   . Myocardial infarction ([place]) 2000  . Neuromuscular disorder ([place])    "achy muscles"  . Obesity   . Osteoarthritis   . PONV (postoperative nausea and vomiting)   . Recurrent UTI   . Spinal stenosis of cervical region   . Steatohepatitis 05/30/10   Brunt Stage 3, non alcoholic cirrhosis of the liver  . Type II or unspecified type diabetes mellitus without mention of complication, uncontrolled    Patient does takes Metformin and recommended to stop for 48 hours.  Marland Kitchen Ulcer   . Unspecified essential hypertension 2014    Patient Active Problem List   Diagnosis Date Noted  . Brain mass 02/02/2020  . Back pain 01/22/2020  . E. coli UTI 01/21/2020  . OAB (overactive bladder) 12/13/2019  . Chronic cystitis 10/08/2019  . Pelvic mass in female 03/28/2019  . Thoracic spondylosis without myelopathy 03/28/2019  . Acute left-sided thoracic back pain 12/30/2018  . Osteoporosis 11/12/2017  . Impacted cerumen of left ear 02/15/2017  . Urge incontinence of urine 02/25/2016  . Benign essential HTN 10/25/2015  . Atrophic kidney 10/25/2015  . History of myocardial infarction  10/25/2015  . Dysuria 08/26/2015  . Microalbuminuria due to type 2 diabetes mellitus ([place]) 06/29/2015  . Fracture of metatarsal of right foot, closed 06/27/2015  . Personal history of gastric ulcer 02/23/2013  . Nontraumatic shoulder pain, right 02/23/2013  . Vitamin D deficiency 02/23/2013  . History of right humeral fracture  02/16/2012  . Gouty arthritis 08/25/2011  . Type 2 diabetes mellitus, controlled, with renal complications ([place]) 88/82/8003  . Hemorrhoid 05/27/2011  . Obesity (BMI 30-39.9) 02/16/2011  . Dyslipidemia associated with type 2 diabetes mellitus ([place]) 02/16/2011  . Cataract extraction status 02/16/2011  . Nonfunctioning left kidney 02/16/2011    . NASH (nonalcoholic steatohepatitis) 02/16/2011    Past Surgical History:  Procedure Laterality Date  . ABDOMINAL HYSTERECTOMY  1982   complete  . BREAST CYST ASPIRATION Right 1990's   benign  . BREAST MASS EXCISION     benign  . CARDIAC CATHETERIZATION  2000   cardiac stent  . cardiac stents  2000  . CATARACT EXTRACTION     both eyes  . COLONOSCOPY  2004, 2015   Dr. Jamal Collin, Dr. Olevia Perches  . CYSTOSCOPY W/ RETROGRADES Left 11/12/2015   Procedure: CYSTOSCOPY WITH RETROGRADE PYELOGRAM;  Surgeon: Cleon Gustin, MD;  Location: [facility] OR[facility];  Service: Urology;  Laterality: Left;  . CYSTOSCOPY W/ URETERAL STENT PLACEMENT Left 11/12/2015   Procedure: CYSTOSCOPY WITH STENT REPLACEMENT;  Surgeon: Cleon Gustin, MD;  Location: [facility];  Service: Urology;  Laterality: Left;  . CYSTOSCOPY WITH BIOPSY N/A 09/26/2019   Procedure: CYSTOSCOPY WITH BIOPSY AND FULGURATION;  Surgeon: Cleon Gustin, MD;  Location[facility];  Service: Urology;  Laterality: N/A;  . CYSTOSCOPY WITH URETEROSCOPY AND STENT PLACEMENT Left 10/15/2015   Procedure: CYSTOSCOPY WITH URETEROSCOPY AND STENT PLACEMENT;  Surgeon: Cleon Gustin, MD;  Location: [facility] OR[facility];  Service: Urology;  Laterality: Left;  . EYE SURGERY Bilateral 2012   cataract  . FOOT SURGERY  2008  . PTCA  2000   stent x 1  . ROBOTIC ASSITED PARTIAL NEPHRECTOMY Left 06/02/2019   Procedure: XI ROBOTIC ASSITED NEPHRECTOMY;  Surgeon: Cleon Gustin, MD;  Location: [facility];  Service: Urology;  Laterality: Left;  . SALPINGOOPHORECTOMY    . TONSILLECTOMY    . UPPER GI ENDOSCOPY    . URETEROSCOPY WITH HOLMIUM LASER LITHOTRIPSY Left 11/12/2015   Procedure: URETEROSCOPY WITH HOLMIUM LASER LITHOTRIPSY;  Surgeon: Cleon Gustin, MD;  Location: [facility];  Service: Urology;  Laterality: Left;  . URETEROTOMY  1960/1969   x 2 during childhood  . VAGINAL HYSTERECTOMY      Prior to Admission medications   Medication Sig Start Date  End Date Taking? Authorizing Provider  aspirin 325 MG tablet Take 325 mg by mouth daily.   Yes [provider]  Cinnamon 500 MG capsule Take 500 mg by mouth daily. 1000 mg bid   Yes [provider]  furosemide (LASIX) 40 MG tablet TAKE ONE TABLET EVERY DAY Patient taking differently: Take 40 mg by mouth daily.  10/11/19  Yes [name], MD  losartan (COZAAR) 50 MG tablet TAKE ONE TABLET EVERY DAY Patient taking differently: Take 50 mg by mouth daily.  01/15/20  Yes [name], MD  metFORMIN (GLUCOPHAGE) 850 MG tablet TAKE ONE TABLET TWICE DAILY WITH A MEAL Patient taking differently: Take 850 mg by mouth daily with breakfast.  07/12/19  Yes McLean-Scocuzza, Nino Glow, MD  nitrofurantoin (MACRODANTIN) 50 MG capsule Take 50 mg by mouth  daily.   Yes [provider]  omeprazole (PRILOSEC) 40 MG capsule TAKE 1 CAPSULE BY MOUTH ONCE DAILY Patient taking differently: Take 40 mg by mouth daily.  11/08/19  Yes [name], MD  rosuvastatin (CRESTOR) 10 MG tablet TAKE ONE TABLET EVERY DAY Patient taking differently: Take 10 mg by mouth daily.  10/11/19  Yes [name], MD  traMADol (ULTRAM) 50 MG tablet Take 1 tablet (50 mg total) by mouth daily as needed. 01/22/20 02/21/20 Yes [name], MD  Vitamin D, Ergocalciferol, (DRISDOL) 1.25 MG (50000 UNIT) CAPS capsule TAKE ONE CAPSULE EACH WEEK Patient taking differently: Take 50,000 Units by mouth every 7 (seven) days. Friday 08/24/19  Yes [name], MD  cefdinir (OMNICEF) 300 MG capsule Take 1 capsule (300 mg total) by mouth 2 (two) times daily. Patient not taking: Reported on 02/02/2020 01/21/20   [name], MD  glucose blood (ONE TOUCH ULTRA TEST) test strip Use as instructed Patient not taking: Reported on 01/22/2020 02/21/18   [name], MD  ibuprofen (ADVIL) 800 MG tablet Take 1 tablet (800 mg total) by mouth every other day. As needed for back pain Patient not taking: Reported on 01/22/2020 10/29/19    [name], MD  loperamide (IMODIUM A-D) 2 MG tablet Take 4 mg by mouth 2 (two) times daily as needed for diarrhea or loose stools.    [provider]  senna-docusate (SENOKOT-S) 8.6-50 MG tablet Take 2 tablets by mouth daily as needed for mild constipation. 06/05/19 06/04/20  Cleon Gustin, MD    Allergies Patient has no known allergies.  Family History  Problem Relation Age of Onset  . Lung cancer Father   . Heart disease Father   . Diverticulosis Mother   . Diabetes Mother   . Stroke Mother 37  . Autoimmune disease Daughter   . Thyroid disease Daughter   . Thyroid disease Sister   . Stomach cancer Maternal Aunt   . Breast cancer Maternal Aunt   . Colon cancer Neg Hx   . Esophageal cancer Neg Hx   . Rectal cancer Neg Hx     Social History Social History   Tobacco Use  . Smoking status: Former Smoker    Packs/day: 0.50    Years: 38.00    Pack years: 19.00    Types: Cigarettes    Quit date: 09/15/1998    Years since quitting: 21.3  . Smokeless tobacco: Never Used  Vaping Use  . Vaping Use: Never used  Substance Use Topics  . Alcohol use: Not Currently  . Drug use: No    Review of Systems  Review of Systems  Constitutional: Positive for fatigue. Negative for fever.  HENT: Negative for congestion and sore throat.   Eyes: Negative for visual disturbance.  Respiratory: Negative for cough and shortness of breath.   Cardiovascular: Negative for chest pain.  Gastrointestinal: Negative for abdominal pain, diarrhea, nausea and vomiting.  Genitourinary: Negative for flank pain.  Musculoskeletal: Negative for back pain and neck pain.  Skin: Negative for rash and wound.  Neurological: Positive for seizures (Seizure-like activity) and speech difficulty. Negative for weakness.  All other systems reviewed and are negative.    ____________________________________________  PHYSICAL EXAM:      VITAL SIGNS: ED Triage Vitals  Enc Vitals Group     BP --       Pulse --      Resp --      Temp --  Temp src --      SpO2 --      Weight 02/02/20 0900 190 lb (86.2 kg)     Height 02/02/20 0927 5' 3"  (1.6 m)     Head Circumference --      Peak Flow --      Pain Score --      Pain Loc --      Pain Edu? --      Excl. in [place]? --      Physical Exam Vitals and nursing note reviewed.  Constitutional:      General: She is not in acute distress.    Appearance: She is well-developed.  HENT:     Head: Normocephalic and atraumatic.  Eyes:     Conjunctiva/sclera: Conjunctivae normal.  Cardiovascular:     Rate and Rhythm: Normal rate and regular rhythm.     Heart sounds: Normal heart sounds. No murmur heard.  No friction rub.  Pulmonary:     Effort: Pulmonary effort is normal. No respiratory distress.     Breath sounds: Normal breath sounds. No wheezing or rales.  Abdominal:     General: There is no distension.     Palpations: Abdomen is soft.     Tenderness: There is no abdominal tenderness.  Musculoskeletal:     Cervical back: Neck supple.  Skin:    General: Skin is warm.     Capillary Refill: Capillary refill takes less than 2 seconds.  Neurological:     Mental Status: She is alert and oriented to person, place, and time.     Motor: No abnormal muscle tone.     Comments: .Neurological Exam:  Mental Status: Alert and oriented to person, place, and time. Attention and concentration normal. Speech clear. Recent memory is intact. Cranial Nerves: Visual fields grossly intact. EOMI and PERRLA. No nystagmus noted. Facial sensation intact at forehead, maxillary cheek, and chin/mandible bilaterally. No facial asymmetry or weakness. Hearing grossly normal. Uvula is midline, and palate elevates symmetrically. Normal SCM and trapezius strength. Tongue midline without fasciculations. Motor: Muscle strength 5/5 in proximal and distal UE and LE bilaterally. No pronator drift. Muscle tone normal. Sensation: Intact to light touch in upper and lower  extremities distally bilaterally.  Gait: Deferred Coordination: Normal FTN bilaterally.          ____________________________________________   LABS (all labs ordered are listed, but only abnormal results are displayed)  Labs Reviewed  DIFFERENTIAL - Abnormal; Notable for the following components:      Result Value   Eosinophils Absolute 0.7 (*)    All other components within normal limits  COMPREHENSIVE METABOLIC PANEL - Abnormal; Notable for the following components:   Glucose, Bld 177 (*)    All other components within normal limits  GLUCOSE, CAPILLARY - Abnormal; Notable for the following components:   Glucose-Capillary 169 (*)    All other components within normal limits  RESPIRATORY PANEL BY RT PCR (FLU A&B, COVID)  PROTIME-INR  APTT  CBC  CBG MONITORING, ED  I-STAT CREATININE, ED    ____________________________________________  EKG: Normal sinus rhythm, ventricular rate 96.  QRS 93, QTc 449.  Nonspecific T wave changes.  No overt ST elevations or depressions.  No ischemia or infarct. ________________________________________  RADIOLOGY All imaging, including plain films, CT scans, and ultrasounds, independently reviewed by me, and interpretations confirmed via formal radiology reads.  ED MD interpretation:   CT head: Vasogenic edema in the right cerebral hemisphere, concerning for possible mass lesion versus infarct  Official radiology report(s): MR ANGIO HEAD WO CONTRAST  Result Date: 02/02/2020 CLINICAL DATA:  Seizure. EXAM: MRA HEAD WITHOUT CONTRAST TECHNIQUE: Angiographic images of the Circle of Willis were obtained using MRA technique without intravenous contrast. COMPARISON:  MRI head 02/02/2020 FINDINGS: Left vertebral artery dominant. Both vertebral arteries patent to the basilar without stenosis. Left PICA patent. Probable small right PICA. Small right AICA patent. Basilar widely patent. Superior cerebellar and posterior cerebral arteries patent without  stenosis. Lobulated aneurysm right cavernous carotid in the Peri ophthalmic region. This measures approximately 6 x 7 mm. Cavernous carotid is patent bilaterally without stenosis Anterior and middle cerebral arteries patent bilaterally without stenosis or additional aneurysm. 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 Electronically Signed   By: Franchot Gallo M.D.   On: 02/02/2020 11:28   MR ANGIO NECK W WO CONTRAST  Result Date: 02/02/2020 CLINICAL DATA:  Seizure. EXAM: MRA NECK WITHOUT AND WITH CONTRAST TECHNIQUE: Multiplanar and multiecho pulse sequences of the neck were obtained without and with intravenous contrast. Angiographic images of the neck were obtained using MRA technique without and with intravenous contrast. CONTRAST:  52m GADAVIST GADOBUTROL 1 MMOL/ML IV SOLN COMPARISON:  None. FINDINGS: Normal aortic arch. Proximal great vessels widely patent without stenosis. Carotid bifurcation is normal bilaterally. No significant stenosis or irregularity Antegrade flow in both vertebral arteries. Left vertebral artery dominant. No vertebral artery stenosis. Aneurysm right cavernous carotid. IMPRESSION: Negative MRA neck. Aneurysm right cavernous carotid. Electronically Signed   By: CFranchot GalloM.D.   On: 02/02/2020 11:25   MR BRAIN W WO CONTRAST  Result Date: 02/02/2020 CLINICAL DATA:  Seizure.  Abnormal CT head. EXAM: MRI HEAD WITHOUT AND WITH CONTRAST TECHNIQUE: Multiplanar, multiecho pulse sequences of the brain and surrounding structures were obtained without and with intravenous contrast. CONTRAST:  948mGADAVIST GADOBUTROL 1 MMOL/ML IV SOLN COMPARISON:  CT head 02/02/2020 FINDINGS: Brain: Peripheral enhancing mass lesion in the right middle frontal lobe laterally with central necrosis and surrounding edema. The enhancing mass measures up to 15 mm in diameter. This is peripherally located. Second peripherally enhancing mass lesion in the  right posterior temporal lobe measures 9 mm in diameter with surrounding vasogenic edema. No third lesion identified. Negative for acute infarct. Scattered small white matter hyperintensities compatible with mild chronic microvascular ischemia. Negative for hydrocephalus or hemorrhage. Vascular: Normal arterial flow voids Skull and upper cervical spine: No focal skeletal lesion. Sinuses/Orbits: Mild mucosal edema paranasal sinuses. Bilateral cataract extraction Other: None IMPRESSION: Two enhancing mass lesions are present in the right cerebral hemisphere with surrounding edema and central necrosis compatible with metastatic disease. No midline shift Mild chronic microvascular ischemic change in the white matter. No acute infarct. Electronically Signed   By: ChFranchot Gallo.D.   On: 02/02/2020 11:22   CT HEAD CODE STROKE WO CONTRAST  Result Date: 02/02/2020 CLINICAL DATA:  Code stroke. Slurred speech and left-sided numbness. EXAM: CT HEAD WITHOUT CONTRAST TECHNIQUE: Contiguous axial images were obtained from the base of the skull through the vertex without intravenous contrast. COMPARISON:  None. FINDINGS: Brain: 2 areas moderate edema in the right cerebral hemisphere, lateral frontal and temporoparietal. Although both in the MCA distribution these show preserved cortex, more suggestive of vasogenic than cytotoxic edema. No acute hemorrhage, hydrocephalus, or shift. Vascular: No hyperdense vessel. Skull: Normal. Negative for fracture or focal lesion. Sinuses/Orbits: Bilateral cataract resection Other: Critical Value/emergent results were called by telephone at the time of interpretation on  02/02/2020 at 9:16 am to provider Merlyn Lot , who verbally acknowledged these results. ASPECTS [facility] Stroke Program Early CT Score) Not scored given the above IMPRESSION: Two areas of edema in the right cerebral hemisphere. Although both are located in the MCA distribution, these have the appearance of vasogenic  edema - more or worrisome for mass lesion than infarct. Recommend brain MRI with contrast. Electronically Signed   By: Monte Fantasia M.D.   On: 02/02/2020 09:19    ____________________________________________  PROCEDURES   Procedure(s) performed (including Critical Care):  .Critical Care Performed by: Duffy Bruce, MD Authorized by: Duffy Bruce, MD   Critical care provider statement:    Critical care time (minutes):  35   Critical care time was exclusive of:  Separately billable procedures and treating other patients and teaching time   Critical care was necessary to treat or prevent imminent or life-threatening deterioration of the following conditions:  Cardiac failure and CNS failure or compromise   Critical care was time spent personally by me on the following activities:  Development of treatment plan with patient or surrogate, discussions with consultants, evaluation of patient's response to treatment, examination of patient, obtaining history from patient or surrogate, ordering and performing treatments and interventions, ordering and review of laboratory studies, ordering and review of radiographic studies, pulse oximetry, re-evaluation of patient's condition and review of old charts   I assumed direction of critical care for this patient from another provider in my specialty: no      ____________________________________________  INITIAL IMPRESSION / MDM / [place] / ED COURSE  As part of my medical decision making, I reviewed the following data within the [place] notes reviewed and incorporated, Old chart reviewed, Notes from prior ED visits, and [place] Controlled Substance Database       *ANTONELLA UPSON was evaluated in Emergency Department on 02/02/2020 for the symptoms described in the history of present illness. She was evaluated in the context of the global COVID-19 pandemic, which necessitated consideration that the patient  might be at risk for infection with the SARS-CoV-2 virus that causes COVID-19. Institutional protocols and algorithms that pertain to the evaluation of patients at risk for COVID-19 are in a state of rapid change based on information released by regulatory bodies including the CDC and federal and state organizations. These policies and algorithms were followed during the patient's care in the ED.  Some ED evaluations and interventions may be delayed as a result of limited staffing during the pandemic.*     Medical Decision Making: 74 year old female here with reported facial twitching and speech difficulty.  Activated as a code stroke on arrival, but history is more so concerning for possible seizure-like activity.  CT scan obtained reviewed by me, and is concerning for 2 areas of vasogenic edema, which are concerning for possible underlying mass/lesion.  Dr. Leonel Ramsay at bedside and will send for MRI.  Otherwise, no apparent alternative etiology based on labs.  No recent infection.  No known history of neurological disease.  No known history of seizures.  No recent medication changes.  Unfortunately, MRI confirmed likely metastatic disease to central areas of tumor and surrounding vasogenic edema.  I suspect this is etiology for her seizure and symptoms.  Will give a dose of Decadron, discuss disposition with the patient and family.  As above.  Admit to medicine.  Dr. Rogue Bussing with oncology contacted and will see the patient.  ____________________________________________  FINAL CLINICAL IMPRESSION(S) / ED  DIAGNOSES  Final diagnoses:  Brain metastases ([place])     MEDICATIONS GIVEN DURING THIS VISIT:  Medications  sodium chloride flush (NS) 0.9 % injection 3 mL (3 mLs Intravenous Not Given 02/02/20 0927)  levETIRAcetam (KEPPRA) tablet 500 mg (has no administration in time range)  levETIRAcetam (KEPPRA) IVPB 1000 mg/100 mL premix (0 mg Intravenous Stopped 02/02/20 1206)  gadobutrol (GADAVIST)  1 MMOL/ML injection 9 mL (9 mLs Intravenous Contrast Given 02/02/20 1107)  dexamethasone (DECADRON) injection 10 mg (10 mg Intravenous Given 02/02/20 1321)     ED Discharge Orders    None       Note:  This document was prepared using Dragon voice recognition software and may include unintentional dictation errors.   Duffy Bruce, MD 02/02/20 1324

## 2020-02-02 NOTE — Telephone Encounter (Signed)
Spoke to Santiago Glad on why [name] cancelled her upcoming appointment.. She is currently in the ER due to a seizure. Santiago Glad tried to schedule for an upcoming hospital follow up, but Tito Dine stated that she is having more testing coming and did not schedule.

## 2020-02-02 NOTE — Code Documentation (Signed)
Pt arrives via POV from home, states yesterday she had an "episode" of slurred speech that resolved, states today she woke up WNL and while talking to a friend at around 8: 74 she developed sudden slurred speech and twitching of her face, code stroke activated upon arrival to ED, Initial NIHSS 1 for facial droop, speech WNL, no tPA due to mild resolving symptoms, CBG 169, report off to [facility] RN for Q30 vitals and neuro checks until 12:30 then Q2

## 2020-02-02 NOTE — Consult Note (Addendum)
Neurology Consultation Reason for Consult: Facial weakness Referring Physician: Ellender Hose, C  CC: Facial weakness  History is obtained from: Patient  HPI: Kimberly Huff is a 74 y.o. female who was in her normal state of health until yesterday.  At that time, she had a transient 5-minute episode of slurred speech and facial weakness.  She did not have any abnormal movements at that time.  She states that things completely resolved and she returned to normal.  This morning, she was again in her normal state of health until 8:19 AM.  At that time she began having twitching of the left side of her face which lasted "less than 5 minutes."  The twitching involved her jaw as well as her eyelid, but she is not sure if it involved her arm or leg.  Following the twitching, she has had some improving left facial weakness and due to this she presented to the emergency department where a code stroke was activated.  CT head with two distinct areas of edema, representing infarct versus tumor.   LKW: 8:19 AM tpa given?: no, mild symptoms    ROS: A 14 point ROS was performed and is negative except as noted in the HPI.  Past Medical History:  Diagnosis Date  . Allergy   . Atrophic kidney    left  . Blood clot in vein 45 yrs ago   inabdoment after surgery  . Blood transfusion without reported diagnosis   . Cataract   . Complication of anesthesia   . Coronary artery disease   . Coronary atherosclerosis   . Diabetes mellitus   . Diverticulosis 2005   Dr Jamal Collin  . Dyspnea    with exertion  . External hemorrhoids without mention of complication 4481  . GERD (gastroesophageal reflux disease)   . H/O cardiac catheterization 2005  . Heart murmur 2012   found by Dr. Jamal Collin  . Hyperlipidemia   . Hypertension   . Kidney stone   . Lesion of bladder   . Myocardial infarction ([place]) 2000  . Neuromuscular disorder ([place])    "achy muscles"  . Obesity   . Osteoarthritis   . PONV (postoperative nausea and  vomiting)   . Recurrent UTI   . Spinal stenosis of cervical region   . Steatohepatitis 05/30/10   Brunt Stage 3, non alcoholic cirrhosis of the liver  . Type II or unspecified type diabetes mellitus without mention of complication, uncontrolled    Patient does takes Metformin and recommended to stop for 48 hours.  Marland Kitchen Ulcer   . Unspecified essential hypertension 2014     Family History  Problem Relation Age of Onset  . Lung cancer Father   . Heart disease Father   . Diverticulosis Mother   . Diabetes Mother   . Stroke Mother 33  . Autoimmune disease Daughter   . Thyroid disease Daughter   . Thyroid disease Sister   . Stomach cancer Maternal Aunt   . Breast cancer Maternal Aunt   . Colon cancer Neg Hx   . Esophageal cancer Neg Hx   . Rectal cancer Neg Hx      Social History:  reports that she quit smoking about 21 years ago. Her smoking use included cigarettes. She has a 19.00 pack-year smoking history. She has never used smokeless tobacco. She reports previous alcohol use. She reports that she does not use drugs.   Exam: Current vital signs: Ht 5' 3"  (1.6 m)   Wt 86.2 kg  BMI 33.66 kg/m  Vital signs in last 24 hours: Weight:  [86.2 kg] 86.2 kg (10/01 0927)   Physical Exam  Constitutional: Appears well-developed and well-nourished.  Psych: Affect appropriate to situation Eyes: No scleral injection HENT: No OP obstrucion MSK: no joint deformities.  Cardiovascular: Normal rate and regular rhythm.  Respiratory: Effort normal, non-labored breathing GI: Soft.  No distension. There is no tenderness.  Skin: WDI  Neuro: Mental Status: Patient is awake, alert, oriented to person, place, month, year, and situation. Patient is able to give a clear and coherent history. No signs of aphasia or neglect Cranial Nerves: II: Decreased acuity in the left eye(baseline), but visual Fields are full to gross bilateral confrontation. Pupils are equal, round, and reactive to light.    III,IV, VI: EOMI without ptosis or diploplia.  V: Facial sensation is symmetric to temperature VII: Facial movement is symmetric.  VIII: hearing is intact to voice X: Uvula elevates symmetrically XI: Shoulder shrug is symmetric. XII: tongue is midline without atrophy or fasciculations.  Motor: Tone is normal. Bulk is normal. 5/5 strength was present in all four extremities.  Sensory: Sensation is symmetric to light touch and temperature in the arms and legs. Cerebellar: FNF and HKS are intact bilaterally   I have reviewed labs in epic and the results pertinent to this consultation are: Glucose 169  I have reviewed the images obtained: CT head-two areas of distinct edema in the right MCA distribution, though I agree with the radiologist that they appear more concerning for vasogenic edema, but I doubt that this is definite.  She  Impression: 74 year old female with an event that sounds like a new onset seizure this morning.  The events of yesterday is less clear, but could represent seizure versus stroke.  It is possible that the hypodensity that was seen on CT is a stroke that occurred yesterday, however I think it is more likely that it represents mass lesions. Either way, she will need to be on antiepileptic therapy.   Recommendations: 1) MRI brain with and without contrast 2) MRA head and neck 3) start Keppra, will give a single 1 g dose followed by 500 twice daily 4) further work-up dependent on whether this is stroke versus tumor.   Roland Rack, MD [facility] [phone]  If 7pm- 7am, please page neurology on call as listed in [place].

## 2020-02-02 NOTE — Progress Notes (Signed)
[place] responded to CODE STROKE in ED; pt. sitting up in bed talking w/RN and neurologist when [facility] arrived.  When neurologist and ED MD completed examination, [place] introduced himself briefly; pt. shared she is [place] and that faith is major source of support and strength; upon learning pt.'s sister is here [facility] helped escort her to rm.  Sister very 'upset' and worried re: pt.'s admission to hospital and risk of COVID exposure for pt. and herself;  sister requested prayer; [place] prayed for pt.'s treatment, protection, and sense of divine presence.  PT. and sister shared they felt much more peaceful after praying.  Pt.'s dtr. enroute from [place], [place].  [place] remains available as needed.

## 2020-02-02 NOTE — Assessment & Plan Note (Addendum)
#  74 year old female patient with a history of solitary kidney; diabetes obesity; prior history of smoking is currently admitted to hospital for difficulty in speech/facial twitching  #Left lung mass/invading the mediastinum highly suspicious for lung cancer.  S/p evaluation with pulmonary.  Discussed that bronchoscopy/biopsy could not be done until at least 1 week [last aspirin 325 mg a day on 10/03].  Alternatively will check with IR if CT-guided biopsy could be done sooner.  #Seizure secondary to brain metastases/edema x2 [10 to 15 millimeters]; improvement of symptoms noted.  S/p evaluation with neurology/neurosurgery.  Also discussed with radiation oncology for consideration of radiation.  Continue steroids dexamethasone 4 mg 3 times daily.  Continue Keppra.  Discussed that she is not supposed to drive at this time with her prior medical clearance.  #Solitary kidney-normal renal function.  #DNR/DNI  #Discussed above plan with patient and daughter at length.  They are in agreement.  Patient wants to go to the beach around 14th October; discussed that we will try our best to adjust her appointment/schedules around her trip.

## 2020-02-02 NOTE — Consult Note (Signed)
[facility] CONSULT NOTE  Patient Care Team: [name], MD as PCP - General (Internal Medicine) [name], MD (General Surgery) [name], MD (Internal Medicine)  CHIEF COMPLAINTS/PURPOSE OF CONSULTATION: Brain metastases  HISTORY OF PRESENTING ILLNESS:  Kimberly Huff 74 y.o.  female with a prior history of smoking quit in 2000; history of CAD; nonalcoholic cirrhosis compensated; history of left atrophic kidney/s/p nephrectomy is current admitted hospital for sudden onset of change in speech.  Patient states that she was with her grandson at lunch noted to have fairly sudden onset of garbled speech; also felt her mouth twitching.  Denies any falls.  Denies any loss of consciousness.  This was quite transient.  However similar episode happened overnight when she presented to the emergency room.  MRI of the brain-showed 33m lesion in the right frontal lobe; and 9 mm lesion in the posterior temporal lobe-associated with vasogenic edema.  Oncology is been consulted for further evaluation recommendations.  Patient denies any personal history of malignancy except skin cancer removed of the left thigh 25 years ago.  Question cancer of the skin taken off back 2016. [Patient is not sure about diagnosis of skin cancer].  Patient had breast biopsies [Dr. Sankar]; benign.  Patient last mammogram was in June 2020-which was within normal limits.  Patient currently feels her speech is back to baseline.  Denies any headaches.  Denies any nausea vomiting.  Review of Systems  Constitutional: Negative for chills, diaphoresis, fever, malaise/fatigue and weight loss.  HENT: Negative for nosebleeds and sore throat.   Eyes: Negative for double vision.  Respiratory: Negative for cough, hemoptysis, sputum production, shortness of breath and wheezing.   Cardiovascular: Negative for chest pain, palpitations, orthopnea and leg swelling.  Gastrointestinal: Negative for  abdominal pain, blood in stool, constipation, diarrhea, heartburn, Huff, nausea and vomiting.  Genitourinary: Negative for dysuria, frequency and urgency.  Musculoskeletal: Negative for back pain and joint pain.  Skin: Negative.  Negative for itching and rash.  Neurological: Positive for speech change. Negative for dizziness, tingling, focal weakness, weakness and headaches.  Endo/Heme/Allergies: Does not bruise/bleed easily.  Psychiatric/Behavioral: Negative for depression. The patient is not nervous/anxious and does not have insomnia.      MEDICAL HISTORY:  Past Medical History:  Diagnosis Date  . Allergy   . Atrophic kidney    left  . Blood clot in vein 45 yrs ago   inabdoment after surgery  . Blood transfusion without reported diagnosis   . Cataract   . Complication of anesthesia   . Coronary artery disease   . Coronary atherosclerosis   . Diabetes mellitus   . Diverticulosis 2005   Dr SJamal Collin . Dyspnea    with exertion  . External hemorrhoids without mention of complication [id number] . GERD (gastroesophageal reflux disease)   . H/O cardiac catheterization 2005  . Heart murmur 2012   found by Dr. SJamal Collin . Hyperlipidemia   . Hypertension   . Kidney stone   . Lesion of bladder   . Myocardial infarction ([facility] 2000  . Neuromuscular disorder ([facility]    "achy muscles"  . Obesity   . Osteoarthritis   . PONV (postoperative nausea and vomiting)   . Recurrent UTI   . Spinal stenosis of cervical region   . Steatohepatitis 05/30/10   Brunt Stage 3, non alcoholic cirrhosis of the liver  . Type II or unspecified type diabetes mellitus without mention of complication, uncontrolled  Patient does takes Metformin and recommended to stop for 48 hours.  Marland Kitchen Ulcer   . Unspecified essential hypertension 2014    SURGICAL HISTORY: Past Surgical History:  Procedure Laterality Date  . ABDOMINAL HYSTERECTOMY  1982   complete  . BREAST CYST ASPIRATION Right 1990's   benign  . BREAST  MASS EXCISION     benign  . CARDIAC CATHETERIZATION  2000   cardiac stent  . cardiac stents  2000  . CATARACT EXTRACTION     both eyes  . COLONOSCOPY  2004, 2015   Dr. Jamal Collin, Dr. Olevia Perches  . CYSTOSCOPY W/ RETROGRADES Left 11/12/2015   Procedure: CYSTOSCOPY WITH RETROGRADE PYELOGRAM;  Surgeon: Cleon Gustin, MD;  Location: [facility];  Service: Urology;  Laterality: Left;  . CYSTOSCOPY W/ URETERAL STENT PLACEMENT Left 11/12/2015   Procedure: CYSTOSCOPY WITH STENT REPLACEMENT;  Surgeon: Cleon Gustin, MD;  Location: [facility];  Service: Urology;  Laterality: Left;  . CYSTOSCOPY WITH BIOPSY N/A 09/26/2019   Procedure: CYSTOSCOPY WITH BIOPSY AND FULGURATION;  Surgeon: Cleon Gustin, MD;  Location: [facility];  Service: Urology;  Laterality: N/A;  . CYSTOSCOPY WITH URETEROSCOPY AND STENT PLACEMENT Left 10/15/2015   Procedure: CYSTOSCOPY WITH URETEROSCOPY AND STENT PLACEMENT;  Surgeon: Cleon Gustin, MD;  Location: [facility];  Service: Urology;  Laterality: Left;  . EYE SURGERY Bilateral 2012   cataract  . FOOT SURGERY  2008  . PTCA  2000   stent x 1  . ROBOTIC ASSITED PARTIAL NEPHRECTOMY Left 06/02/2019   Procedure: XI ROBOTIC ASSITED NEPHRECTOMY;  Surgeon: Cleon Gustin, MD;  Location: [facility];  Service: Urology;  Laterality: Left;  . SALPINGOOPHORECTOMY    . TONSILLECTOMY    . UPPER GI ENDOSCOPY    . URETEROSCOPY WITH HOLMIUM LASER LITHOTRIPSY Left 11/12/2015   Procedure: URETEROSCOPY WITH HOLMIUM LASER LITHOTRIPSY;  Surgeon: Cleon Gustin, MD;  Location: [facility];  Service: Urology;  Laterality: Left;  . URETEROTOMY  1960/1969   x 2 during childhood  . VAGINAL HYSTERECTOMY      SOCIAL HISTORY: Social History   Socioeconomic History  . Marital status: Single    Spouse name: Not on file  . Number of children: 2  . Years of education: Not on file  . Highest education level: Not on file  Occupational History  . 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 Smoking status: Former Smoker    Packs/day: 0.50    Years: 38.00    Pack years: 19.00    Types: Cigarettes    Quit date: 09/15/1998    Years since quitting: 21.4  . Smokeless tobacco: Never Used  Vaping Use  . Vaping Use: Never used  Substance and Sexual Activity  . Alcohol use: Not Currently  . Drug use: No  . Sexual activity: Not Currently  Other Topics Concern  . Not on file  Social History Narrative   Lives in [place]; daughter lives in [place]. Quit smoking may 14th 2000. No alcohol. Retired  From [employer] job.    Social Determinants of Health   Financial Resource Strain: Low Risk   . Difficulty of Paying Living Expenses: Not hard at all  Food Insecurity: No Food Insecurity  . Worried About Charity fundraiser in the Last Year: Never true  . Ran Out of Food in the Last Year: Never true  Transportation Needs: No Transportation Needs  . Lack of Transportation (  Medical): No  . Lack of Transportation (Non-Medical): No  Physical Activity:   . Days of Exercise per Week: Not on file  . Minutes of Exercise per Session: Not on file  Stress: No Stress Concern Present  . Feeling of Stress : Not at all  Social Connections: Unknown  . Frequency of Communication with Friends and Family: More than three times a week  . Frequency of Social Gatherings with Friends and Family: More than three times a week  . Attends Religious Services: More than 4 times per year  . Active Member of Clubs or Organizations: Yes  . Attends Archivist Meetings: More than 4 times per year  . Marital Status: Not on file  Intimate Partner Violence: Not At Risk  . Fear of Current or Ex-Partner: No  . Emotionally Abused: No  . Physically Abused: No  . Sexually Abused: No    FAMILY HISTORY: Family History  Problem Relation Age of Onset  . Lung cancer Father   . Heart disease Father   . Diverticulosis Mother   . Diabetes Mother   .  Stroke Mother 72  . Autoimmune disease Daughter   . Thyroid disease Daughter   . Thyroid disease Sister   . Stomach cancer Maternal Aunt   . Breast cancer Maternal Aunt   . Colon cancer Neg Hx   . Esophageal cancer Neg Hx   . Rectal cancer Neg Hx     ALLERGIES:  has No Known Allergies.  MEDICATIONS:  Current Facility-Administered Medications  Medication Dose Route Frequency Provider Last Rate Last Admin  . 0.9 %  sodium chloride infusion   Intravenous Continuous Agbata, Tochukwu, MD 75 mL/hr at 02/03/20 0700 Rate Verify at 02/03/20 0700  . aspirin EC tablet 325 mg  325 mg Oral Daily Agbata, Tochukwu, MD      . dexamethasone (DECADRON) injection 4 mg  4 mg Intravenous Q6H Agbata, Tochukwu, MD   4 mg at 02/03/20 0622  . enoxaparin (LOVENOX) injection 40 mg  40 mg Subcutaneous Q24H Agbata, Tochukwu, MD   40 mg at 02/02/20 1825  . furosemide (LASIX) tablet 40 mg  40 mg Oral Daily Agbata, Tochukwu, MD      . insulin aspart (novoLOG) injection 0-15 Units  0-15 Units Subcutaneous TID WC Agbata, Tochukwu, MD   11 Units at 02/02/20 2125  . levETIRAcetam (KEPPRA) tablet 500 mg  500 mg Oral BID Greta Doom, MD   500 mg at 02/02/20 2124  . losartan (COZAAR) tablet 50 mg  50 mg Oral Daily Agbata, Tochukwu, MD      . ondansetron (ZOFRAN) tablet 4 mg  4 mg Oral Q6H PRN Agbata, Tochukwu, MD       Or  . ondansetron (ZOFRAN) injection 4 mg  4 mg Intravenous Q6H PRN Agbata, Tochukwu, MD      . pantoprazole (PROTONIX) EC tablet 40 mg  40 mg Oral Daily Agbata, Tochukwu, MD      . rosuvastatin (CRESTOR) tablet 10 mg  10 mg Oral Daily Agbata, Tochukwu, MD      . senna-docusate (Senokot-S) tablet 2 tablet  2 tablet Oral Daily PRN Agbata, Tochukwu, MD      . sodium chloride flush (NS) 0.9 % injection 3 mL  3 mL Intravenous Once Merlyn Lot, MD      . traMADol Veatrice Bourbon) tablet 50 mg  50 mg Oral Daily PRN Collier Bullock, MD      . Derrill Memo ON 02/08/2020] Vitamin D (Ergocalciferol) (DRISDOL)  capsule 50,000 Units  50,000 Units Oral Q7 days Agbata, Tochukwu, MD          .  PHYSICAL EXAMINATION:  Vitals:   02/03/20 0527 02/03/20 0802  BP: 113/68 (!) 141/72  Pulse: 62 (!) 57  Resp: (!) 30 20  Temp: 97.7 F (36.5 C) 98.4 F (36.9 C)  SpO2: 97% 94%   Filed Weights   02/02/20 0900 02/02/20 0927 02/03/20 0530  Weight: 190 lb (86.2 kg) 190 lb (86.2 kg) 189 lb 14.4 oz (86.1 kg)    Physical Exam Constitutional:      Comments: In no acute distress.  Accompanied by daughter  HENT:     Head: Normocephalic and atraumatic.     Mouth/Throat:     Pharynx: No oropharyngeal exudate.  Eyes:     Pupils: Pupils are equal, round, and reactive to light.  Cardiovascular:     Rate and Rhythm: Normal rate and regular rhythm.  Pulmonary:     Effort: Pulmonary effort is normal. No respiratory distress.     Breath sounds: Normal breath sounds. No wheezing.  Abdominal:     General: Bowel sounds are normal. There is no distension.     Palpations: Abdomen is soft. There is no mass.     Tenderness: There is no abdominal tenderness. There is no guarding or rebound.  Musculoskeletal:        General: No tenderness. Normal range of motion.     Cervical back: Normal range of motion and neck supple.  Skin:    General: Skin is warm.  Neurological:     Mental Status: She is alert and oriented to person, place, and time.  Psychiatric:        Mood and Affect: Affect normal.      LABORATORY DATA:  I have reviewed the data as listed Lab Results  Component Value Date   WBC 7.9 02/03/2020   HGB 12.6 02/03/2020   HCT 37.8 02/03/2020   MCV 79.4 (L) 02/03/2020   PLT 169 02/03/2020   Recent Labs    06/03/19 0434 06/29/19 0819 10/04/19 0802 10/04/19 0802 01/29/20 0856 02/02/20 0925 02/03/20 0541  NA 136   < > 141   < > 139 137 133*  K 4.2   < > 4.7   < > 5.1 4.2 4.3  CL 104   < > 105   < > 101 100 99  CO2 26   < > 29   < > 29 27 24   GLUCOSE 125*   < > 187*   < > 169* 177* 248*   BUN 11   < > 21   < > 23 16 18   CREATININE 0.62   < > 0.75   < > 0.78 0.81 0.78  CALCIUM 9.0   < > 9.7   < > 9.4 9.6 9.2  GFRNONAA >60  --   --   --   --  >60 >60  GFRAA >60  --   --   --   --  >60 >60  PROT  --    < > 6.6  --  6.7 7.7  --   ALBUMIN  --    < > 4.1  --  4.3 4.5  --   AST  --    < > 12  --  19 29  --   ALT  --    < > 15  --  25 31  --   ALKPHOS  --    < >  43  --  43 43  --   BILITOT  --    < > 0.4  --  0.6 0.9  --    < > = values in this interval not displayed.    RADIOGRAPHIC STUDIES: I have personally reviewed the radiological images as listed and agreed with the findings in the report. MR ANGIO HEAD WO CONTRAST  Result Date: 02/02/2020 CLINICAL DATA:  Seizure. EXAM: MRA HEAD WITHOUT CONTRAST TECHNIQUE: Angiographic images of the Circle of Willis were obtained using MRA technique without intravenous contrast. COMPARISON:  MRI head 02/02/2020 FINDINGS: Left vertebral artery dominant. Both vertebral arteries patent to the basilar without stenosis. Left PICA patent. Probable small right PICA. Small right AICA patent. Basilar widely patent. Superior cerebellar and posterior cerebral arteries patent without stenosis. Lobulated aneurysm right cavernous carotid in the Peri ophthalmic region. This measures approximately 6 x 7 mm. Cavernous carotid is patent bilaterally without stenosis Anterior and middle cerebral arteries patent bilaterally without stenosis or additional aneurysm. 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 Electronically Signed   By: Franchot Gallo M.D.   On: 02/02/2020 11:28   MR ANGIO NECK W WO CONTRAST  Result Date: 02/02/2020 CLINICAL DATA:  Seizure. EXAM: MRA NECK WITHOUT AND WITH CONTRAST TECHNIQUE: Multiplanar and multiecho pulse sequences of the neck were obtained without and with intravenous contrast. Angiographic images of the neck were obtained using MRA technique without and with intravenous  contrast. CONTRAST:  28m GADAVIST GADOBUTROL 1 MMOL/ML IV SOLN COMPARISON:  None. FINDINGS: Normal aortic arch. Proximal great vessels widely patent without stenosis. Carotid bifurcation is normal bilaterally. No significant stenosis or irregularity Antegrade flow in both vertebral arteries. Left vertebral artery dominant. No vertebral artery stenosis. Aneurysm right cavernous carotid. IMPRESSION: Negative MRA neck. Aneurysm right cavernous carotid. Electronically Signed   By: CFranchot GalloM.D.   On: 02/02/2020 11:25   MR BRAIN W WO CONTRAST  Result Date: 02/02/2020 CLINICAL DATA:  Seizure.  Abnormal CT head. EXAM: MRI HEAD WITHOUT AND WITH CONTRAST TECHNIQUE: Multiplanar, multiecho pulse sequences of the brain and surrounding structures were obtained without and with intravenous contrast. CONTRAST:  938mGADAVIST GADOBUTROL 1 MMOL/ML IV SOLN COMPARISON:  CT head 02/02/2020 FINDINGS: Brain: Peripheral enhancing mass lesion in the right middle frontal lobe laterally with central necrosis and surrounding edema. The enhancing mass measures up to 15 mm in diameter. This is peripherally located. Second peripherally enhancing mass lesion in the right posterior temporal lobe measures 9 mm in diameter with surrounding vasogenic edema. No third lesion identified. Negative for acute infarct. Scattered small white matter hyperintensities compatible with mild chronic microvascular ischemia. Negative for hydrocephalus or hemorrhage. Vascular: Normal arterial flow voids Skull and upper cervical spine: No focal skeletal lesion. Sinuses/Orbits: Mild mucosal edema paranasal sinuses. Bilateral cataract extraction Other: None IMPRESSION: Two enhancing mass lesions are present in the right cerebral hemisphere with surrounding edema and central necrosis compatible with metastatic disease. No midline shift Mild chronic microvascular ischemic change in the white matter. No acute infarct. Electronically Signed   By: ChFranchot GalloM.D.   On: 02/02/2020 11:22   CT CHEST ABDOMEN PELVIS W CONTRAST  Result Date: 02/02/2020 CLINICAL DATA:  [age]ear old female with abnormal brain MRI and CT today compatible with cerebral metastases. Unknown primary. History of left oophorectomy for nonfunctioning left kidney. EXAM: CT CHEST, ABDOMEN, AND PELVIS WITH CONTRAST TECHNIQUE: Multidetector CT imaging of the chest, abdomen and pelvis was performed following  the standard protocol during bolus administration of intravenous contrast. CONTRAST:  134m OMNIPAQUE IOHEXOL 300 MG/ML  SOLN COMPARISON:  Brain MRI today. [facility] CT Abdomen and Pelvis 07/28/2019 and earlier. FINDINGS: CT CHEST FINDINGS Cardiovascular: Calcified aortic atherosclerosis. Calcified coronary artery atherosclerosis. No cardiomegaly or pericardial effusion. Mediastinum/Nodes: Left upper lung mass inseparable from the mediastinum including the aortic arch (series 5, image 84). But mediastinal lymph nodes remains small, up to 6 mm short axis. Lungs/Pleura: Lobulated up to 8 cm mass of the medial left upper lung inseparable from the anterior lung apex and mediastinum. No associated left pleural effusion. There is contralateral right lower lobe small but spiculated nodule measuring about 11 mm (series 5, image 102). There are also two smaller sub solid nodules in the right upper lobe on series 4, image 48 and image 70. Superimposed scattered bilateral subpleural pulmonary scarring. Musculoskeletal: Stable mild T4 superior endplate compression. No acute or suspicious osseous lesion identified in the chest. CT ABDOMEN PELVIS FINDINGS Hepatobiliary: Hepatic steatosis and mildly nodular liver contour (series 2, image 55) suspicious for cirrhosis. No discrete liver lesion. Negative gallbladder. Pancreas: Partially atrophied but otherwise negative. Spleen: Negative.  Incidental splenule (normal variant). Adrenals/Urinary Tract: Normal adrenal glands. Surgically absent left  kidney as before. Right renal enhancement and contrast excretion is preserved. An anterior midpole exophytic low-density renal mass measures up to 3.8 cm diameter now, versus 2.2 cm in 2017. This has low to intermediate density by CT. On 2018 renal ultrasound this appeared simple and benign. Negative right ureter. Diminutive and unremarkable urinary bladder. Stomach/Bowel: Diverticulosis of the sigmoid colon. Oral contrast has reached the splenic flexure without obstruction. Decompressed ascending and proximal transverse colon. Negative appendix. Decompressed terminal ileum. No dilated small bowel. Unremarkable stomach. Chronic duodenal diverticulum near the midline appears inconsequential. No free air. No free fluid. Vascular/Lymphatic: Extensive Aortoiliac calcified atherosclerosis. Major arterial structures remain patent. Portal venous system is patent. No lymphadenopathy. Reproductive: Surgically absent uterus. Ovaries are within normal limits on series 2, image 105). Other: No pelvic free fluid. Small umbilical hernia with trace fluid is stable. Musculoskeletal: Advanced lumbar spine degeneration including grade 1 spondylolisthesis in the lower lumbar spine with bulky facet hypertrophy. No acute or suspicious osseous lesion identified. There is a chronic oval mixed density soft tissue mass along the anterior lower sacrum which has only mildly increased in size since 2017, measuring up to 4 cm diameter now on series 2, image 102. This is stable since March. IMPRESSION: 1. Positive for an 8 cm left upper lung mass inseparable from the mediastinum compatible with Bronchogenic Carcinoma. 2. Mediastinal lymph nodes remain normal by size criteria. There are three indeterminate small but spiculated right lung nodules, the largest measuring 11 mm in the lower lobe. 3. No metastatic disease identified in the abdomen or pelvis. 4. Suspicion of Cirrhosis with hepatic steatosis. 5. Chronic presacral soft tissue mass  favored to be benign myelolipoma. Chronic right renal cyst which had a benign ultrasound appearance in 2018. Sigmoid diverticulosis. Duodenal diverticulum. 6. Calcified coronary artery and Aortic atherosclerosis (ICD10-I70.0). Electronically Signed   By: HGenevie AnnM.D.   On: 02/02/2020 20:54   CT HEAD CODE STROKE WO CONTRAST  Result Date: 02/02/2020 CLINICAL DATA:  Code stroke. Slurred speech and left-sided numbness. EXAM: CT HEAD WITHOUT CONTRAST TECHNIQUE: Contiguous axial images were obtained from the base of the skull through the vertex without intravenous contrast. COMPARISON:  None. FINDINGS: Brain: 2 areas moderate edema in the right cerebral hemisphere, lateral frontal  and temporoparietal. Although both in the MCA distribution these show preserved cortex, more suggestive of vasogenic than cytotoxic edema. No acute hemorrhage, hydrocephalus, or shift. Vascular: No hyperdense vessel. Skull: Normal. Negative for fracture or focal lesion. Sinuses/Orbits: Bilateral cataract resection Other: Critical Value/emergent results were called by telephone at the time of interpretation on 02/02/2020 at 9:16 am to provider Merlyn Lot , who verbally acknowledged these results. ASPECTS [facility] Stroke Program Early CT Score) Not scored given the above IMPRESSION: Two areas of edema in the right cerebral hemisphere. Although both are located in the MCA distribution, these have the appearance of vasogenic edema - more or worrisome for mass lesion than infarct. Recommend brain MRI with contrast. Electronically Signed   By: Monte Fantasia M.D.   On: 02/02/2020 09:19    Brain metastasis [facility]) #74 year old female patient with a history of solitary kidney; diabetes obesity; prior history of smoking is currently admitted to hospital for difficulty in speech/facial twitching  #Difficulty in speech/facial twitching-likely secondary to to lesions in brain [approximately 1 cm in right frontal/right posterior temporal lobe]  with vasogenic edema-suspicious of metastases.  #Diabetes/obesity/solitary kidney-normal renal function  Recommendations:  #Recommend steroids dexamethasone 4 mg every 6 hours-help with the edema.  #Recommend a CT scan chest and pelvis with contrast for further evaluation/assess for any primary lesions in the brain.  # Thank you Dr.Agbata for allowing me to participate in the care of your pleasant patient. Please do not hesitate to contact me with questions or concerns in the interim.  The above plan of care was discussed the patient/daughter at length.  They are in agreement.  # I reviewed the blood work- with the patient in detail; also reviewed the imaging independently [as summarized above]; and with the patient in detail.    All questions were answered. The patient knows to call the clinic with any problems, questions or concerns.   [name], MD 02/03/2020 8:44 AM

## 2020-02-02 NOTE — Telephone Encounter (Signed)
Patient called to let Dr. Derrel Nip know she is at the ED. Patient is canceling her 2w follow up on 02/05/20

## 2020-02-02 NOTE — ED Triage Notes (Signed)
Pt reports yesterday she had an episode of slurred speech and some weakness. Pt reports she laid down for a bit and the sx's went away. Pt states this am she had another episode of slurred speech and her jaw was having spasms. Pt states she tried to drink water but could not get her left side to work and spilled her water on her. Pt reports he symptoms started at 8:19

## 2020-02-02 NOTE — Progress Notes (Signed)
CODE STROKE- PHARMACY COMMUNICATION   Time CODE STROKE called/page received: 0912  Time response to CODE STROKE was made (in person or via phone): 0915  Time Stroke Kit retrieved from [place] (only if needed): NA  Name of Provider/Nurse contacted: no tPA - neurologist at bedside    Tawnya Crook ,PharmD Clinical Pharmacist  02/02/2020  9:33 AM

## 2020-02-03 ENCOUNTER — Encounter: Payer: Self-pay | Admitting: Internal Medicine

## 2020-02-03 DIAGNOSIS — R569 Unspecified convulsions: Secondary | ICD-10-CM

## 2020-02-03 DIAGNOSIS — I671 Cerebral aneurysm, nonruptured: Secondary | ICD-10-CM | POA: Diagnosis present

## 2020-02-03 LAB — HEMOGLOBIN A1C
Hgb A1c MFr Bld: 7.3 % — ABNORMAL HIGH (ref 4.8–5.6)
Mean Plasma Glucose: 162.81 mg/dL

## 2020-02-03 LAB — CBC
HCT: 37.8 % (ref 36.0–46.0)
Hemoglobin: 12.6 g/dL (ref 12.0–15.0)
MCH: 26.5 pg (ref 26.0–34.0)
MCHC: 33.3 g/dL (ref 30.0–36.0)
MCV: 79.4 fL — ABNORMAL LOW (ref 80.0–100.0)
Platelets: 169 10*3/uL (ref 150–400)
RBC: 4.76 MIL/uL (ref 3.87–5.11)
RDW: 14.1 % (ref 11.5–15.5)
WBC: 7.9 10*3/uL (ref 4.0–10.5)
nRBC: 0 % (ref 0.0–0.2)

## 2020-02-03 LAB — GLUCOSE, CAPILLARY
Glucose-Capillary: 232 mg/dL — ABNORMAL HIGH (ref 70–99)
Glucose-Capillary: 264 mg/dL — ABNORMAL HIGH (ref 70–99)
Glucose-Capillary: 320 mg/dL — ABNORMAL HIGH (ref 70–99)
Glucose-Capillary: 159 mg/dL — ABNORMAL HIGH (ref 70–99)

## 2020-02-03 LAB — BASIC METABOLIC PANEL
Anion gap: 10 (ref 5–15)
BUN: 18 mg/dL (ref 8–23)
CO2: 24 mmol/L (ref 22–32)
Calcium: 9.2 mg/dL (ref 8.9–10.3)
Chloride: 99 mmol/L (ref 98–111)
Creatinine, Ser: 0.78 mg/dL (ref 0.44–1.00)
GFR calc Af Amer: >60 mL/min (ref >60)
GFR calc non Af Amer: >60 mL/min (ref >60)
Glucose, Bld: 248 mg/dL — ABNORMAL HIGH (ref 70–99)
Potassium: 4.3 mmol/L (ref 3.5–5.1)
Sodium: 133 mmol/L — ABNORMAL LOW (ref 135–145)

## 2020-02-03 MED ORDER — DEXAMETHASONE SODIUM PHOSPHATE 4 MG/ML IJ SOLN
4.0000 mg | Freq: Three times a day (TID) | INTRAMUSCULAR | Status: DC
  Administered 2020-02-03 – 2020-02-04 (×4): 4 mg via INTRAVENOUS
  Filled 2020-02-03 (×6): qty 1

## 2020-02-03 NOTE — ED Notes (Signed)
Assumed care of pt at 2300, pt ambulated to toilet independently. Denies lightheaded or dizziness with standing or ambulation. Pt provided with a pillow and ice water per request. Call bell within reach, side rails up x2. Pt denies sob, or cp. AO x4. Talking in full sentences with regular and unlabored breathing. Inpatient bed ready on 2A, this RN attempted to call report and receiving RN refused to take report at this time.

## 2020-02-03 NOTE — ED Notes (Signed)
Report given to Michelle, RN

## 2020-02-03 NOTE — Plan of Care (Signed)
  Problem: Education: Goal: Knowledge of General Education information will improve Description Including pain rating scale, medication(s)/side effects and non-pharmacologic comfort measures Outcome: Progressing   

## 2020-02-03 NOTE — Progress Notes (Signed)
Kimberly Huff   DOB:August 16, 1945   PQ#:[id number]    Subjective: No acute events overnight.  Did not  sleep well overnight because of transfer from the emergency room.  Did not have any further episodes of facial status or slurring of speech.  Objective:  Vitals:   02/03/20 2058 02/04/20 0504  BP: 127/89 115/76  Pulse: (!) 58 (!) 51  Resp: 18 18  Temp: 98.3 F (36.8 C) 97.9 F (36.6 C)  SpO2: 95% 96%     Intake/Output Summary (Last 24 hours) at 02/04/2020 0726 Last data filed at 02/03/2020 1500 Gross per 24 hour  Intake 600.05 ml  Output 700 ml  Net -99.95 ml    Physical Exam Vitals and nursing note reviewed.  Constitutional:      Comments: Patient resting in the bed comfortably.  No acute distress.  Accompanied by daughter.  HENT:     Head: Normocephalic and atraumatic.     Mouth/Throat:     Pharynx: No oropharyngeal exudate.  Eyes:     Pupils: Pupils are equal, round, and reactive to light.  Cardiovascular:     Rate and Rhythm: Normal rate and regular rhythm.  Pulmonary:     Effort: Pulmonary effort is normal. No respiratory distress.     Breath sounds: Normal breath sounds. No wheezing.  Abdominal:     General: Bowel sounds are normal. There is no distension.     Palpations: Abdomen is soft. There is no mass.     Tenderness: There is no abdominal tenderness. There is no guarding or rebound.  Musculoskeletal:        General: No tenderness. Normal range of motion.     Cervical back: Normal range of motion and neck supple.  Skin:    General: Skin is warm.  Neurological:     Mental Status: She is alert and oriented to person, place, and time.  Psychiatric:        Mood and Affect: Affect normal.      Labs:  Lab Results  Component Value Date   WBC 7.9 02/03/2020   HGB 12.6 02/03/2020   HCT 37.8 02/03/2020   MCV 79.4 (L) 02/03/2020   PLT 169 02/03/2020   NEUTROABS 3.7 02/02/2020    Lab Results  Component Value Date   NA 133 (L) 02/03/2020   K 4.3 02/03/2020    CL 99 02/03/2020   CO2 24 02/03/2020    Studies:  MR ANGIO HEAD WO CONTRAST  Result Date: 02/02/2020 CLINICAL DATA:  Seizure. EXAM: MRA HEAD WITHOUT CONTRAST TECHNIQUE: Angiographic images of the Circle of Willis were obtained using MRA technique without intravenous contrast. COMPARISON:  MRI head 02/02/2020 FINDINGS: Left vertebral artery dominant. Both vertebral arteries patent to the basilar without stenosis. Left PICA patent. Probable small right PICA. Small right AICA patent. Basilar widely patent. Superior cerebellar and posterior cerebral arteries patent without stenosis. Lobulated aneurysm right cavernous carotid in the Peri ophthalmic region. This measures approximately 6 x 7 mm. Cavernous carotid is patent bilaterally without stenosis Anterior and middle cerebral arteries patent bilaterally without stenosis or additional aneurysm. 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 Electronically Signed   By: Franchot Gallo M.D.   On: 02/02/2020 11:28   MR ANGIO NECK W WO CONTRAST  Result Date: 02/02/2020 CLINICAL DATA:  Seizure. EXAM: MRA NECK WITHOUT AND WITH CONTRAST TECHNIQUE: Multiplanar and multiecho pulse sequences of the neck were obtained without and with intravenous contrast. Angiographic  images of the neck were obtained using MRA technique without and with intravenous contrast. CONTRAST:  74m GADAVIST GADOBUTROL 1 MMOL/ML IV SOLN COMPARISON:  None. FINDINGS: Normal aortic arch. Proximal great vessels widely patent without stenosis. Carotid bifurcation is normal bilaterally. No significant stenosis or irregularity Antegrade flow in both vertebral arteries. Left vertebral artery dominant. No vertebral artery stenosis. Aneurysm right cavernous carotid. IMPRESSION: Negative MRA neck. Aneurysm right cavernous carotid. Electronically Signed   By: CFranchot GalloM.D.   On: 02/02/2020 11:25   MR BRAIN W WO CONTRAST  Result Date:  02/02/2020 CLINICAL DATA:  Seizure.  Abnormal CT head. EXAM: MRI HEAD WITHOUT AND WITH CONTRAST TECHNIQUE: Multiplanar, multiecho pulse sequences of the brain and surrounding structures were obtained without and with intravenous contrast. CONTRAST:  961mGADAVIST GADOBUTROL 1 MMOL/ML IV SOLN COMPARISON:  CT head 02/02/2020 FINDINGS: Brain: Peripheral enhancing mass lesion in the right middle frontal lobe laterally with central necrosis and surrounding edema. The enhancing mass measures up to 15 mm in diameter. This is peripherally located. Second peripherally enhancing mass lesion in the right posterior temporal lobe measures 9 mm in diameter with surrounding vasogenic edema. No third lesion identified. Negative for acute infarct. Scattered small white matter hyperintensities compatible with mild chronic microvascular ischemia. Negative for hydrocephalus or hemorrhage. Vascular: Normal arterial flow voids Skull and upper cervical spine: No focal skeletal lesion. Sinuses/Orbits: Mild mucosal edema paranasal sinuses. Bilateral cataract extraction Other: None IMPRESSION: Two enhancing mass lesions are present in the right cerebral hemisphere with surrounding edema and central necrosis compatible with metastatic disease. No midline shift Mild chronic microvascular ischemic change in the white matter. No acute infarct. Electronically Signed   By: ChFranchot Gallo.D.   On: 02/02/2020 11:22   CT CHEST ABDOMEN PELVIS W CONTRAST  Result Date: 02/02/2020 CLINICAL DATA:  [age]ear old female with abnormal brain MRI and CT today compatible with cerebral metastases. Unknown primary. History of left oophorectomy for nonfunctioning left kidney. EXAM: CT CHEST, ABDOMEN, AND PELVIS WITH CONTRAST TECHNIQUE: Multidetector CT imaging of the chest, abdomen and pelvis was performed following the standard protocol during bolus administration of intravenous contrast. CONTRAST:  10068mMNIPAQUE IOHEXOL 300 MG/ML  SOLN COMPARISON:  Brain  MRI today. [facility] CT Abdomen and Pelvis 07/28/2019 and earlier. FINDINGS: CT CHEST FINDINGS Cardiovascular: Calcified aortic atherosclerosis. Calcified coronary artery atherosclerosis. No cardiomegaly or pericardial effusion. Mediastinum/Nodes: Left upper lung mass inseparable from the mediastinum including the aortic arch (series 5, image 84). But mediastinal lymph nodes remains small, up to 6 mm short axis. Lungs/Pleura: Lobulated up to 8 cm mass of the medial left upper lung inseparable from the anterior lung apex and mediastinum. No associated left pleural effusion. There is contralateral right lower lobe small but spiculated nodule measuring about 11 mm (series 5, image 102). There are also two smaller sub solid nodules in the right upper lobe on series 4, image 48 and image 70. Superimposed scattered bilateral subpleural pulmonary scarring. Musculoskeletal: Stable mild T4 superior endplate compression. No acute or suspicious osseous lesion identified in the chest. CT ABDOMEN PELVIS FINDINGS Hepatobiliary: Hepatic steatosis and mildly nodular liver contour (series 2, image 55) suspicious for cirrhosis. No discrete liver lesion. Negative gallbladder. Pancreas: Partially atrophied but otherwise negative. Spleen: Negative.  Incidental splenule (normal variant). Adrenals/Urinary Tract: Normal adrenal glands. Surgically absent left kidney as before. Right renal enhancement and contrast excretion is preserved. An anterior midpole exophytic low-density renal mass measures up to 3.8 cm diameter now, versus 2.2 cm  in 2017. This has low to intermediate density by CT. On 2018 renal ultrasound this appeared simple and benign. Negative right ureter. Diminutive and unremarkable urinary bladder. Stomach/Bowel: Diverticulosis of the sigmoid colon. Oral contrast has reached the splenic flexure without obstruction. Decompressed ascending and proximal transverse colon. Negative appendix. Decompressed  terminal ileum. No dilated small bowel. Unremarkable stomach. Chronic duodenal diverticulum near the midline appears inconsequential. No free air. No free fluid. Vascular/Lymphatic: Extensive Aortoiliac calcified atherosclerosis. Major arterial structures remain patent. Portal venous system is patent. No lymphadenopathy. Reproductive: Surgically absent uterus. Ovaries are within normal limits on series 2, image 105). Other: No pelvic free fluid. Small umbilical hernia with trace fluid is stable. Musculoskeletal: Advanced lumbar spine degeneration including grade 1 spondylolisthesis in the lower lumbar spine with bulky facet hypertrophy. No acute or suspicious osseous lesion identified. There is a chronic oval mixed density soft tissue mass along the anterior lower sacrum which has only mildly increased in size since 2017, measuring up to 4 cm diameter now on series 2, image 102. This is stable since March. IMPRESSION: 1. Positive for an 8 cm left upper lung mass inseparable from the mediastinum compatible with Bronchogenic Carcinoma. 2. Mediastinal lymph nodes remain normal by size criteria. There are three indeterminate small but spiculated right lung nodules, the largest measuring 11 mm in the lower lobe. 3. No metastatic disease identified in the abdomen or pelvis. 4. Suspicion of Cirrhosis with hepatic steatosis. 5. Chronic presacral soft tissue mass favored to be benign myelolipoma. Chronic right renal cyst which had a benign ultrasound appearance in 2018. Sigmoid diverticulosis. Duodenal diverticulum. 6. Calcified coronary artery and Aortic atherosclerosis (ICD10-I70.0). Electronically Signed   By: Genevie Ann M.D.   On: 02/02/2020 20:54   CT HEAD CODE STROKE WO CONTRAST  Result Date: 02/02/2020 CLINICAL DATA:  Code stroke. Slurred speech and left-sided numbness. EXAM: CT HEAD WITHOUT CONTRAST TECHNIQUE: Contiguous axial images were obtained from the base of the skull through the vertex without intravenous  contrast. COMPARISON:  None. FINDINGS: Brain: 2 areas moderate edema in the right cerebral hemisphere, lateral frontal and temporoparietal. Although both in the MCA distribution these show preserved cortex, more suggestive of vasogenic than cytotoxic edema. No acute hemorrhage, hydrocephalus, or shift. Vascular: No hyperdense vessel. Skull: Normal. Negative for fracture or focal lesion. Sinuses/Orbits: Bilateral cataract resection Other: Critical Value/emergent results were called by telephone at the time of interpretation on 02/02/2020 at 9:16 am to provider Merlyn Lot , who verbally acknowledged these results. ASPECTS [facility] Stroke Program Early CT Score) Not scored given the above IMPRESSION: Two areas of edema in the right cerebral hemisphere. Although both are located in the MCA distribution, these have the appearance of vasogenic edema - more or worrisome for mass lesion than infarct. Recommend brain MRI with contrast. Electronically Signed   By: Monte Fantasia M.D.   On: 02/02/2020 09:19    Brain metastasis [facility]) #74 year old female patient with a history of solitary kidney; diabetes obesity; prior history of smoking is currently admitted to hospital for difficulty in speech/facial twitching  # Difficulty in speech/facial twitching-likely secondary to to lesions in brain [approximately 1 cm in right frontal/right posterior temporal lobe] with vasogenic edema-suspicious of metastases.  CT scan chest shows approximately 8 cm left upper lobe mass abutting mediastinum; small mediastinal lymph nodes; contralateral right lower lobe approximately 1 cm nodule.  Clinically suspicious suspicious for lung cancer with metastasis to brain; especially context of smoking.   #Recommendation:  #Discussed the patient and daughter  the above suspected diagnosis; and clinical stage IV malignancy/which unfortunately is incurable.  However lung cancer given stage IV given especially patient's overall performance  status is amenable to treatment.  Patient's systemic treatment options would depend upon-histology of malignancy; NGS etc.  Would recommend evaluation with pulmonary regarding bronchoscopy/with biopsy.  Discussed with Dr. Lanney Gins who kindly agrees to evaluate the patient.  Patient and daughter interested in proceeding with diagnostic work-up/treatment plan.  Will consult radiation oncology.  Will discuss with Dr. Donella Stade.  #Decrease steroids 4 mg IV every 8 hours.  Await neurology evaluation.  Discussed with neurosurgery, Dr. Cari Caraway.  Patient not a candidate for resection at this time.  Await evaluation with Dr. Donella Stade.  #Solitary kidney-normal renal function.  #DNR/DNI.  # 40 minutes face-to-face with the patient discussing the above plan of care; more than 50% of time spent on prognosis/ natural history; counseling and coordination. # I reviewed the blood work- with the patient in detail; also reviewed the imaging independently [as summarized above]; and with the patient in detail.   [name], MD 02/04/2020  7:26 AM

## 2020-02-03 NOTE — Progress Notes (Signed)
Subjective: Patient tolerating Keppra.  No new seizure activity noted.    Objective: Current vital signs: BP (!) 129/54 (BP Location: Right Arm)   Pulse 65   Temp (!) 97.4 F (36.3 C) (Oral)   Resp 17   Ht 5' 3"  (1.6 m)   Wt 86.1 kg   SpO2 94%   BMI 33.64 kg/m  Vital signs in last 24 hours: Temp:  [97.4 F (36.3 C)-98.4 F (36.9 C)] 97.4 F (36.3 C) (10/02 1151) Pulse Rate:  [57-102] 65 (10/02 1151) Resp:  [15-30] 17 (10/02 1151) BP: (83-145)/(48-91) 129/54 (10/02 1151) SpO2:  [92 %-97 %] 94 % (10/02 1151) Weight:  [86.1 kg] 86.1 kg (10/02 0530)  Intake/Output from previous day: 10/01 0701 - 10/02 0700 In: 949.9 [I.V.:949.9] Out: -  Intake/Output this shift: Total I/O In: -  Out: 300 [Urine:300] Nutritional status:  Diet Order            Diet heart healthy/carb modified Room service appropriate? Yes; Fluid consistency: Thin  Diet effective now                 Neurologic Exam: Mental Status: Patient is awake, alert, oriented to person, place, month, year, and situation. Speech fluent.  Able to follow commands.   Cranial Nerves: II: Decreased acuity in the left eye(baseline), but visual Fields are full to gross bilateral confrontation. Pupils are equal, round, and reactive to light.   III,IV, VI: EOMI without ptosis or diploplia.  V: Facial sensation intact VII: Facial movement is symmetric.  VIII: hearing is intact to voice X: Uvula elevates symmetrically XI: Shoulder shrug is symmetric. XII: tongue is midline without atrophy or fasciculations.  Motor: Tone is normal. Bulk is normal. 5/5 strength was present in all four extremities.  Sensory: Light touch intact throughout   Lab Results: Basic Metabolic Panel: Recent Labs  Lab 01/29/20 0856 02/02/20 0925 02/03/20 0541  NA 139 137 133*  K 5.1 4.2 4.3  CL 101 100 99  CO2 29 27 24   GLUCOSE 169* 177* 248*  BUN 23 16 18   CREATININE 0.78 0.81 0.78  CALCIUM 9.4 9.6 9.2    Liver Function  Tests: Recent Labs  Lab 01/29/20 0856 02/02/20 0925  AST 19 29  ALT 25 31  ALKPHOS 43 43  BILITOT 0.6 0.9  PROT 6.7 7.7  ALBUMIN 4.3 4.5   No results for input(s): LIPASE, AMYLASE in the last 168 hours. No results for input(s): AMMONIA in the last 168 hours.  CBC: Recent Labs  Lab 02/02/20 0925 02/03/20 0541  WBC 7.7 7.9  NEUTROABS 3.7  --   HGB 13.0 12.6  HCT 40.9 37.8  MCV 83.0 79.4*  PLT 170 169    Cardiac Enzymes: No results for input(s): CKTOTAL, CKMB, CKMBINDEX, TROPONINI in the last 168 hours.  Lipid Panel: Recent Labs  Lab 01/29/20 0856  CHOL 101  TRIG 201.0*  HDL 25.20*  CHOLHDL 4  VLDL 40.2*    CBG: Recent Labs  Lab 02/02/20 0920 02/02/20 2119 02/03/20 0738 02/03/20 1152  GLUCAP 169* 341* 232* 264*    Microbiology: Results for orders placed or performed during the hospital encounter of 02/02/20  Respiratory Panel by RT PCR (Flu A&B, Covid) - Nasopharyngeal Swab     Status: None   Collection Time: 02/02/20  1:22 PM   Specimen: Nasopharyngeal Swab  Result Value Ref Range Status   SARS Coronavirus 2 by RT PCR NEGATIVE NEGATIVE Final    Comment: (NOTE) SARS-CoV-2 target nucleic  acids are NOT DETECTED.  The SARS-CoV-2 RNA is generally detectable in upper respiratoy specimens during the acute phase of infection. The lowest concentration of SARS-CoV-2 viral copies this assay can detect is 131 copies/mL. A negative result does not preclude SARS-Cov-2 infection and should not be used as the sole basis for treatment or other patient management decisions. A negative result may occur with  improper specimen collection/handling, submission of specimen other than nasopharyngeal swab, presence of viral mutation(s) within the areas targeted by this assay, and inadequate number of viral copies (<131 copies/mL). A negative result must be combined with clinical observations, patient history, and epidemiological information. The expected result is  Negative.  Fact Sheet for Patients:  PinkCheek.be  Fact Sheet for Healthcare Providers:  GravelBags.it  This test is no t yet approved or cleared by the [place] FDA and  has been authorized for detection and/or diagnosis of SARS-CoV-2 by FDA under an Emergency Use Authorization (EUA). This EUA will remain  in effect (meaning this test can be used) for the duration of the COVID-19 declaration under Section 564(b)(1) of the Act, 21 U.S.C. section 360bbb-3(b)(1), unless the authorization is terminated or revoked sooner.     Influenza A by PCR NEGATIVE NEGATIVE Final   Influenza B by PCR NEGATIVE NEGATIVE Final    Comment: (NOTE) The Xpert Xpress SARS-CoV-2/FLU/RSV assay is intended as an aid in  the diagnosis of influenza from Nasopharyngeal swab specimens and  should not be used as a sole basis for treatment. Nasal washings and  aspirates are unacceptable for Xpert Xpress SARS-CoV-2/FLU/RSV  testing.  Fact Sheet for Patients: PinkCheek.be  Fact Sheet for Healthcare Providers: GravelBags.it  This test is not yet approved or cleared by the [place] FDA and  has been authorized for detection and/or diagnosis of SARS-CoV-2 by  FDA under an Emergency Use Authorization (EUA). This EUA will remain  in effect (meaning this test can be used) for the duration of the  Covid-19 declaration under Section 564(b)(1) of the Act, 21  U.S.C. section 360bbb-3(b)(1), unless the authorization is  terminated or revoked. Performed at [facility], [address], [place], [place] [address]     Coagulation Studies: Recent Labs    02/02/20 0925  LABPROT 13.3  INR 1.1    Imaging: MR ANGIO HEAD WO CONTRAST  Result Date: 02/02/2020 CLINICAL DATA:  Seizure. EXAM: MRA HEAD WITHOUT CONTRAST TECHNIQUE: Angiographic images of the Circle of Willis were obtained  using MRA technique without intravenous contrast. COMPARISON:  MRI head 02/02/2020 FINDINGS: Left vertebral artery dominant. Both vertebral arteries patent to the basilar without stenosis. Left PICA patent. Probable small right PICA. Small right AICA patent. Basilar widely patent. Superior cerebellar and posterior cerebral arteries patent without stenosis. Lobulated aneurysm right cavernous carotid in the Peri ophthalmic region. This measures approximately 6 x 7 mm. Cavernous carotid is patent bilaterally without stenosis Anterior and middle cerebral arteries patent bilaterally without stenosis or additional aneurysm. 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 Electronically Signed   By: Franchot Gallo M.D.   On: 02/02/2020 11:28   MR ANGIO NECK W WO CONTRAST  Result Date: 02/02/2020 CLINICAL DATA:  Seizure. EXAM: MRA NECK WITHOUT AND WITH CONTRAST TECHNIQUE: Multiplanar and multiecho pulse sequences of the neck were obtained without and with intravenous contrast. Angiographic images of the neck were obtained using MRA technique without and with intravenous contrast. CONTRAST:  57m GADAVIST GADOBUTROL 1 MMOL/ML IV SOLN COMPARISON:  None. FINDINGS: Normal aortic arch. Proximal great vessels widely patent without stenosis. Carotid bifurcation is normal bilaterally. No significant stenosis or irregularity Antegrade flow in both vertebral arteries. Left vertebral artery dominant. No vertebral artery stenosis. Aneurysm right cavernous carotid. IMPRESSION: Negative MRA neck. Aneurysm right cavernous carotid. Electronically Signed   By: Franchot Gallo M.D.   On: 02/02/2020 11:25   MR BRAIN W WO CONTRAST  Result Date: 02/02/2020 CLINICAL DATA:  Seizure.  Abnormal CT head. EXAM: MRI HEAD WITHOUT AND WITH CONTRAST TECHNIQUE: Multiplanar, multiecho pulse sequences of the brain and surrounding structures were obtained without and with intravenous contrast. CONTRAST:   27m GADAVIST GADOBUTROL 1 MMOL/ML IV SOLN COMPARISON:  CT head 02/02/2020 FINDINGS: Brain: Peripheral enhancing mass lesion in the right middle frontal lobe laterally with central necrosis and surrounding edema. The enhancing mass measures up to 15 mm in diameter. This is peripherally located. Second peripherally enhancing mass lesion in the right posterior temporal lobe measures 9 mm in diameter with surrounding vasogenic edema. No third lesion identified. Negative for acute infarct. Scattered small white matter hyperintensities compatible with mild chronic microvascular ischemia. Negative for hydrocephalus or hemorrhage. Vascular: Normal arterial flow voids Skull and upper cervical spine: No focal skeletal lesion. Sinuses/Orbits: Mild mucosal edema paranasal sinuses. Bilateral cataract extraction Other: None IMPRESSION: Two enhancing mass lesions are present in the right cerebral hemisphere with surrounding edema and central necrosis compatible with metastatic disease. No midline shift Mild chronic microvascular ischemic change in the white matter. No acute infarct. Electronically Signed   By: CFranchot GalloM.D.   On: 02/02/2020 11:22   CT CHEST ABDOMEN PELVIS W CONTRAST  Result Date: 02/02/2020 CLINICAL DATA:  74year old female with abnormal brain MRI and CT today compatible with cerebral metastases. Unknown primary. History of left oophorectomy for nonfunctioning left kidney. EXAM: CT CHEST, ABDOMEN, AND PELVIS WITH CONTRAST TECHNIQUE: Multidetector CT imaging of the chest, abdomen and pelvis was performed following the standard protocol during bolus administration of intravenous contrast. CONTRAST:  103mOMNIPAQUE IOHEXOL 300 MG/ML  SOLN COMPARISON:  Brain MRI today. [facility] CT Abdomen and Pelvis 07/28/2019 and earlier. FINDINGS: CT CHEST FINDINGS Cardiovascular: Calcified aortic atherosclerosis. Calcified coronary artery atherosclerosis. No cardiomegaly or pericardial effusion.  Mediastinum/Nodes: Left upper lung mass inseparable from the mediastinum including the aortic arch (series 5, image 84). But mediastinal lymph nodes remains small, up to 6 mm short axis. Lungs/Pleura: Lobulated up to 8 cm mass of the medial left upper lung inseparable from the anterior lung apex and mediastinum. No associated left pleural effusion. There is contralateral right lower lobe small but spiculated nodule measuring about 11 mm (series 5, image 102). There are also two smaller sub solid nodules in the right upper lobe on series 4, image 48 and image 70. Superimposed scattered bilateral subpleural pulmonary scarring. Musculoskeletal: Stable mild T4 superior endplate compression. No acute or suspicious osseous lesion identified in the chest. CT ABDOMEN PELVIS FINDINGS Hepatobiliary: Hepatic steatosis and mildly nodular liver contour (series 2, image 55) suspicious for cirrhosis. No discrete liver lesion. Negative gallbladder. Pancreas: Partially atrophied but otherwise negative. Spleen: Negative.  Incidental splenule (normal variant). Adrenals/Urinary Tract: Normal adrenal glands. Surgically absent left kidney as before. Right renal enhancement and contrast excretion is preserved. An anterior midpole exophytic low-density renal mass measures up to 3.8 cm diameter now, versus 2.2 cm in 2017. This has low to intermediate density by CT. On 2018 renal ultrasound this appeared simple and benign. Negative right ureter. Diminutive and unremarkable  urinary bladder. Stomach/Bowel: Diverticulosis of the sigmoid colon. Oral contrast has reached the splenic flexure without obstruction. Decompressed ascending and proximal transverse colon. Negative appendix. Decompressed terminal ileum. No dilated small bowel. Unremarkable stomach. Chronic duodenal diverticulum near the midline appears inconsequential. No free air. No free fluid. Vascular/Lymphatic: Extensive Aortoiliac calcified atherosclerosis. Major arterial structures  remain patent. Portal venous system is patent. No lymphadenopathy. Reproductive: Surgically absent uterus. Ovaries are within normal limits on series 2, image 105). Other: No pelvic free fluid. Small umbilical hernia with trace fluid is stable. Musculoskeletal: Advanced lumbar spine degeneration including grade 1 spondylolisthesis in the lower lumbar spine with bulky facet hypertrophy. No acute or suspicious osseous lesion identified. There is a chronic oval mixed density soft tissue mass along the anterior lower sacrum which has only mildly increased in size since 2017, measuring up to 4 cm diameter now on series 2, image 102. This is stable since March. IMPRESSION: 1. Positive for an 8 cm left upper lung mass inseparable from the mediastinum compatible with Bronchogenic Carcinoma. 2. Mediastinal lymph nodes remain normal by size criteria. There are three indeterminate small but spiculated right lung nodules, the largest measuring 11 mm in the lower lobe. 3. No metastatic disease identified in the abdomen or pelvis. 4. Suspicion of Cirrhosis with hepatic steatosis. 5. Chronic presacral soft tissue mass favored to be benign myelolipoma. Chronic right renal cyst which had a benign ultrasound appearance in 2018. Sigmoid diverticulosis. Duodenal diverticulum. 6. Calcified coronary artery and Aortic atherosclerosis (ICD10-I70.0). Electronically Signed   By: Genevie Ann M.D.   On: 02/02/2020 20:54   CT HEAD CODE STROKE WO CONTRAST  Result Date: 02/02/2020 CLINICAL DATA:  Code stroke. Slurred speech and left-sided numbness. EXAM: CT HEAD WITHOUT CONTRAST TECHNIQUE: Contiguous axial images were obtained from the base of the skull through the vertex without intravenous contrast. COMPARISON:  None. FINDINGS: Brain: 2 areas moderate edema in the right cerebral hemisphere, lateral frontal and temporoparietal. Although both in the MCA distribution these show preserved cortex, more suggestive of vasogenic than cytotoxic edema.  No acute hemorrhage, hydrocephalus, or shift. Vascular: No hyperdense vessel. Skull: Normal. Negative for fracture or focal lesion. Sinuses/Orbits: Bilateral cataract resection Other: Critical Value/emergent results were called by telephone at the time of interpretation on 02/02/2020 at 9:16 am to provider Merlyn Lot , who verbally acknowledged these results. ASPECTS [facility] Stroke Program Early CT Score) Not scored given the above IMPRESSION: Two areas of edema in the right cerebral hemisphere. Although both are located in the MCA distribution, these have the appearance of vasogenic edema - more or worrisome for mass lesion than infarct. Recommend brain MRI with contrast. Electronically Signed   By: Monte Fantasia M.D.   On: 02/02/2020 09:19    Medications:  I have reviewed the patient's current medications. Scheduled: . aspirin EC  325 mg Oral Daily  . dexamethasone (DECADRON) injection  4 mg Intravenous Q6H  . enoxaparin (LOVENOX) injection  40 mg Subcutaneous Q24H  . furosemide  40 mg Oral Daily  . insulin aspart  0-15 Units Subcutaneous TID WC  . levETIRAcetam  500 mg Oral BID  . losartan  50 mg Oral Daily  . pantoprazole  40 mg Oral Daily  . rosuvastatin  10 mg Oral Daily  . sodium chloride flush  3 mL Intravenous Once  . [START ON 02/08/2020] Vitamin D (Ergocalciferol)  50,000 Units Oral Q7 days    Assessment/Plan: 74 year old female presenting with new onset seizure activity.  MRI of the brain  personally reviewed an shows two enhancing lesions in the right hemisphere.  Patient started on Keppra and Decadron.  Tolerating both well.  Recommendations: 1. Oncology to follow patient with primary work up in progress 2. Continue Keppra at 513m BID po daily 3. Seizure precautions 4. Patient unable to drive, operate heavy machinery, perform activities at heights and participate in water activities until release by outpatient physician.   LOS: 1 day   LAlexis Goodell  MD Neurology [phone]/06/2019  12:10 PM

## 2020-02-03 NOTE — ED Notes (Signed)
RN attempted to call report to receiving floor, no answer

## 2020-02-03 NOTE — ED Notes (Signed)
This RN attempted to call report p inpatient bed ready for 5h46m Charge RN on receiving unit unable to take this RNs call and no receiving RN is assigned for pt at this time.

## 2020-02-03 NOTE — Progress Notes (Addendum)
Progress Note    Kimberly Huff  EUM:[id number] DOB: 01/25/1946  DOA: 02/02/2020 PCP: [name], MD      Brief Narrative:    Medical records reviewed and are as summarized below:  Kimberly Huff is a 74 y.o. female with medical history significant for diabetes mellitus, coronary artery disease, dyslipidemia, GERD, status post left nephrectomy in July 2021, who was brought to the hospital because of brief episode of slurred speech and left facial weakness.  Work-up in the hospital showed 8 cm left lung mass and 2 brain lesions suspicious for metastatic disease.  She was started on Keppra for seizure prophylaxis and she was also started on IV dexamethasone.  She was evaluated by the oncologist, neurologist and and neurosurgeon.  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  Pulmonologist was consulted for further evaluation of left lung mass and she will likely need bronchoscopy in order to obtain tissue for biopsy.     Assessment/Plan:   Principal Problem:   Brain mass Active Problems:   Obesity (BMI 30-39.9)   Benign essential HTN   Diabetes mellitus ([place])   Obesity (BMI 30.0-34.9)   Brain metastasis ([place])   Brain aneurysm  Right periophthalmic artery brain aneurysm CAD Status post left nephrectomy in January 2021  Body mass index is 33.64 kg/m.  Obesity)  PLAN  Continue Keppra for seizure prophylaxis Continue IV dexamethasone for brain masses suspected to be metastatic Humalog as needed for hyperglycemia. Continue losartan for hypertension Consulted Dr. Cari Caraway, neurosurgeon, for evaluation of brain aneurysm Consulted pulmonologist, Dr. Lanney Gins, for evaluation of lung mass.  She will likely have bronchoscopy early next week. Follow-up with neurologist and oncologist. Plan of care was discussed with patient and her daughter, [name], at the bedside.      Diet  Order            Diet heart healthy/carb modified Room service appropriate? Yes; Fluid consistency: Thin  Diet effective now                    Consultants:  Neurologist  Neurosurgery  Pulmonologist  Oncologist    Procedures:  None    Medications:   . aspirin EC  325 mg Oral Daily  . dexamethasone (DECADRON) injection  4 mg Intravenous Q8H  . enoxaparin (LOVENOX) injection  40 mg Subcutaneous Q24H  . furosemide  40 mg Oral Daily  . insulin aspart  0-15 Units Subcutaneous TID WC  . levETIRAcetam  500 mg Oral BID  . losartan  50 mg Oral Daily  . pantoprazole  40 mg Oral Daily  . rosuvastatin  10 mg Oral Daily  . sodium chloride flush  3 mL Intravenous Once  . [START ON 02/08/2020] Vitamin D (Ergocalciferol)  50,000 Units Oral Q7 days   Continuous Infusions: . sodium chloride 75 mL/hr at 02/03/20 0700     Anti-infectives (From admission, onward)   None             Family Communication/Anticipated D/C date and plan/Code Status   DVT prophylaxis: enoxaparin (LOVENOX) injection 40 mg Start: 02/02/20 1800     Code Status: DNR  Family Communication:  Disposition Plan:    Status is: Inpatient  Remains inpatient appropriate because:Ongoing diagnostic testing needed not appropriate for outpatient work up, IV treatments appropriate due to intensity of illness or inability to take PO and Inpatient level of care appropriate due  to severity of illness   Dispo: The patient is from: Home              Anticipated d/c is to: Home              Anticipated d/c date is: 3 days              Patient currently is not medically stable to d/c.           Subjective:   No complaints.  No headache, dizziness, blurred vision, slurred speech, unilateral weakness or numbness.  She says she has been able to walk to the bathroom today without any issues.  Objective:    Vitals:   02/03/20 0530 02/03/20 0802 02/03/20 1151 02/03/20 1524  BP:  (!) 141/72  (!) 129/54 (!) 157/51  Pulse:  (!) 57 65 69  Resp:  20 17 18   Temp:  98.4 F (36.9 C) (!) 97.4 F (36.3 C) 97.7 F (36.5 C)  TempSrc:  Oral Oral Oral  SpO2:  94% 94% 97%  Weight: 86.1 kg     Height: 5' 3"  (1.6 m)      No data found.   Intake/Output Summary (Last 24 hours) at 02/03/2020 1546 Last data filed at 02/03/2020 1500 Gross per 24 hour  Intake 1549.99 ml  Output 700 ml  Net 849.99 ml   Filed Weights   02/02/20 0900 02/02/20 0927 02/03/20 0530  Weight: 86.2 kg 86.2 kg 86.1 kg    Exam:  GEN: NAD SKIN: No rash EYES: EOMI ENT: MMM CV: RRR PULM: CTA B ABD: soft, ND, NT, +BS CNS: AAO x 3, non focal, no facial weakness EXT: No edema or tenderness    Data Reviewed:   I have personally reviewed following labs and imaging studies:  Labs: Labs show the following:   Basic Metabolic Panel: Recent Labs  Lab 01/29/20 0856 01/29/20 0856 02/02/20 0925 02/03/20 0541  NA 139  --  137 133*  K 5.1   < > 4.2 4.3  CL 101  --  100 99  CO2 29  --  27 24  GLUCOSE 169*  --  177* 248*  BUN 23  --  16 18  CREATININE 0.78  --  0.81 0.78  CALCIUM 9.4  --  9.6 9.2   < > = values in this interval not displayed.   GFR Estimated Creatinine Clearance: 62.2 mL/min (by C-G formula based on SCr of 0.78 mg/dL). Liver Function Tests: Recent Labs  Lab 01/29/20 0856 02/02/20 0925  AST 19 29  ALT 25 31  ALKPHOS 43 43  BILITOT 0.6 0.9  PROT 6.7 7.7  ALBUMIN 4.3 4.5   No results for input(s): LIPASE, AMYLASE in the last 168 hours. No results for input(s): AMMONIA in the last 168 hours. Coagulation profile Recent Labs  Lab 02/02/20 0925  INR 1.1    CBC: Recent Labs  Lab 02/02/20 0925 02/03/20 0541  WBC 7.7 7.9  NEUTROABS 3.7  --   HGB 13.0 12.6  HCT 40.9 37.8  MCV 83.0 79.4*  PLT 170 169   Cardiac Enzymes: No results for input(s): CKTOTAL, CKMB, CKMBINDEX, TROPONINI in the last 168 hours. BNP (last 3 results) No results for input(s): PROBNP in the last  8760 hours. CBG: Recent Labs  Lab 02/02/20 0920 02/02/20 2119 02/03/20 0738 02/03/20 1152  GLUCAP 169* 341* 232* 264*   D-Dimer: No results for input(s): DDIMER in the last 72 hours. Hgb A1c: Recent Labs  02/02/20 0925  HGBA1C 7.3*   Lipid Profile: No results for input(s): CHOL, HDL, LDLCALC, TRIG, CHOLHDL, LDLDIRECT in the last 72 hours. Thyroid function studies: No results for input(s): TSH, T4TOTAL, T3FREE, THYROIDAB in the last 72 hours.  Invalid input(s): FREET3 Anemia work up: No results for input(s): VITAMINB12, FOLATE, FERRITIN, TIBC, IRON, RETICCTPCT in the last 72 hours. Sepsis Labs: Recent Labs  Lab 02/02/20 0925 02/03/20 0541  WBC 7.7 7.9    Microbiology Recent Results (from the past 240 hour(s))  Respiratory Panel by RT PCR (Flu A&B, Covid) - Nasopharyngeal Swab     Status: None   Collection Time: 02/02/20  1:22 PM   Specimen: Nasopharyngeal Swab  Result Value Ref Range Status   SARS Coronavirus 2 by RT PCR NEGATIVE NEGATIVE Final    Comment: (NOTE) SARS-CoV-2 target nucleic acids are NOT DETECTED.  The SARS-CoV-2 RNA is generally detectable in upper respiratoy specimens during the acute phase of infection. The lowest concentration of SARS-CoV-2 viral copies this assay can detect is 131 copies/mL. A negative result does not preclude SARS-Cov-2 infection and should not be used as the sole basis for treatment or other patient management decisions. A negative result may occur with  improper specimen collection/handling, submission of specimen other than nasopharyngeal swab, presence of viral mutation(s) within the areas targeted by this assay, and inadequate number of viral copies (<131 copies/mL). A negative result must be combined with clinical observations, patient history, and epidemiological information. The expected result is Negative.  Fact Sheet for Patients:  PinkCheek.be  Fact Sheet for Healthcare  Providers:  GravelBags.it  This test is no t yet approved or cleared by the [place] FDA and  has been authorized for detection and/or diagnosis of SARS-CoV-2 by FDA under an Emergency Use Authorization (EUA). This EUA will remain  in effect (meaning this test can be used) for the duration of the COVID-19 declaration under Section 564(b)(1) of the Act, 21 U.S.C. section 360bbb-3(b)(1), unless the authorization is terminated or revoked sooner.     Influenza A by PCR NEGATIVE NEGATIVE Final   Influenza B by PCR NEGATIVE NEGATIVE Final    Comment: (NOTE) The Xpert Xpress SARS-CoV-2/FLU/RSV assay is intended as an aid in  the diagnosis of influenza from Nasopharyngeal swab specimens and  should not be used as a sole basis for treatment. Nasal washings and  aspirates are unacceptable for Xpert Xpress SARS-CoV-2/FLU/RSV  testing.  Fact Sheet for Patients: PinkCheek.be  Fact Sheet for Healthcare Providers: GravelBags.it  This test is not yet approved or cleared by the [place] FDA and  has been authorized for detection and/or diagnosis of SARS-CoV-2 by  FDA under an Emergency Use Authorization (EUA). This EUA will remain  in effect (meaning this test can be used) for the duration of the  Covid-19 declaration under Section 564(b)(1) of the Act, 21  U.S.C. section 360bbb-3(b)(1), unless the authorization is  terminated or revoked. Performed at [facility], [address], [place], [place] [address]     Procedures and diagnostic studies:  MR ANGIO HEAD WO CONTRAST  Result Date: 02/02/2020 CLINICAL DATA:  Seizure. EXAM: MRA HEAD WITHOUT CONTRAST TECHNIQUE: Angiographic images of the Circle of Willis were obtained using MRA technique without intravenous contrast. COMPARISON:  MRI head 02/02/2020 FINDINGS: Left vertebral artery dominant. Both vertebral arteries patent to the basilar  without stenosis. Left PICA patent. Probable small right PICA. Small right AICA patent. Basilar widely patent. Superior cerebellar and posterior cerebral arteries patent without stenosis.  Lobulated aneurysm right cavernous carotid in the Peri ophthalmic region. This measures approximately 6 x 7 mm. Cavernous carotid is patent bilaterally without stenosis Anterior and middle cerebral arteries patent bilaterally without stenosis or additional aneurysm. 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 Electronically Signed   By: Franchot Gallo M.D.   On: 02/02/2020 11:28   MR ANGIO NECK W WO CONTRAST  Result Date: 02/02/2020 CLINICAL DATA:  Seizure. EXAM: MRA NECK WITHOUT AND WITH CONTRAST TECHNIQUE: Multiplanar and multiecho pulse sequences of the neck were obtained without and with intravenous contrast. Angiographic images of the neck were obtained using MRA technique without and with intravenous contrast. CONTRAST:  46m GADAVIST GADOBUTROL 1 MMOL/ML IV SOLN COMPARISON:  None. FINDINGS: Normal aortic arch. Proximal great vessels widely patent without stenosis. Carotid bifurcation is normal bilaterally. No significant stenosis or irregularity Antegrade flow in both vertebral arteries. Left vertebral artery dominant. No vertebral artery stenosis. Aneurysm right cavernous carotid. IMPRESSION: Negative MRA neck. Aneurysm right cavernous carotid. Electronically Signed   By: CFranchot GalloM.D.   On: 02/02/2020 11:25   MR BRAIN W WO CONTRAST  Result Date: 02/02/2020 CLINICAL DATA:  Seizure.  Abnormal CT head. EXAM: MRI HEAD WITHOUT AND WITH CONTRAST TECHNIQUE: Multiplanar, multiecho pulse sequences of the brain and surrounding structures were obtained without and with intravenous contrast. CONTRAST:  980mGADAVIST GADOBUTROL 1 MMOL/ML IV SOLN COMPARISON:  CT head 02/02/2020 FINDINGS: Brain: Peripheral enhancing mass lesion in the right middle frontal lobe laterally  with central necrosis and surrounding edema. The enhancing mass measures up to 15 mm in diameter. This is peripherally located. Second peripherally enhancing mass lesion in the right posterior temporal lobe measures 9 mm in diameter with surrounding vasogenic edema. No third lesion identified. Negative for acute infarct. Scattered small white matter hyperintensities compatible with mild chronic microvascular ischemia. Negative for hydrocephalus or hemorrhage. Vascular: Normal arterial flow voids Skull and upper cervical spine: No focal skeletal lesion. Sinuses/Orbits: Mild mucosal edema paranasal sinuses. Bilateral cataract extraction Other: None IMPRESSION: Two enhancing mass lesions are present in the right cerebral hemisphere with surrounding edema and central necrosis compatible with metastatic disease. No midline shift Mild chronic microvascular ischemic change in the white matter. No acute infarct. Electronically Signed   By: ChFranchot Gallo.D.   On: 02/02/2020 11:22   CT CHEST ABDOMEN PELVIS W CONTRAST  Result Date: 02/02/2020 CLINICAL DATA:  [age]ear old female with abnormal brain MRI and CT today compatible with cerebral metastases. Unknown primary. History of left oophorectomy for nonfunctioning left kidney. EXAM: CT CHEST, ABDOMEN, AND PELVIS WITH CONTRAST TECHNIQUE: Multidetector CT imaging of the chest, abdomen and pelvis was performed following the standard protocol during bolus administration of intravenous contrast. CONTRAST:  10074mMNIPAQUE IOHEXOL 300 MG/ML  SOLN COMPARISON:  Brain MRI today. [facility] CT Abdomen and Pelvis 07/28/2019 and earlier. FINDINGS: CT CHEST FINDINGS Cardiovascular: Calcified aortic atherosclerosis. Calcified coronary artery atherosclerosis. No cardiomegaly or pericardial effusion. Mediastinum/Nodes: Left upper lung mass inseparable from the mediastinum including the aortic arch (series 5, image 84). But mediastinal lymph nodes remains small, up to 6  mm short axis. Lungs/Pleura: Lobulated up to 8 cm mass of the medial left upper lung inseparable from the anterior lung apex and mediastinum. No associated left pleural effusion. There is contralateral right lower lobe small but spiculated nodule measuring about 11 mm (series 5, image 102). There are also two smaller sub solid nodules in the right upper lobe  on series 4, image 48 and image 70. Superimposed scattered bilateral subpleural pulmonary scarring. Musculoskeletal: Stable mild T4 superior endplate compression. No acute or suspicious osseous lesion identified in the chest. CT ABDOMEN PELVIS FINDINGS Hepatobiliary: Hepatic steatosis and mildly nodular liver contour (series 2, image 55) suspicious for cirrhosis. No discrete liver lesion. Negative gallbladder. Pancreas: Partially atrophied but otherwise negative. Spleen: Negative.  Incidental splenule (normal variant). Adrenals/Urinary Tract: Normal adrenal glands. Surgically absent left kidney as before. Right renal enhancement and contrast excretion is preserved. An anterior midpole exophytic low-density renal mass measures up to 3.8 cm diameter now, versus 2.2 cm in 2017. This has low to intermediate density by CT. On 2018 renal ultrasound this appeared simple and benign. Negative right ureter. Diminutive and unremarkable urinary bladder. Stomach/Bowel: Diverticulosis of the sigmoid colon. Oral contrast has reached the splenic flexure without obstruction. Decompressed ascending and proximal transverse colon. Negative appendix. Decompressed terminal ileum. No dilated small bowel. Unremarkable stomach. Chronic duodenal diverticulum near the midline appears inconsequential. No free air. No free fluid. Vascular/Lymphatic: Extensive Aortoiliac calcified atherosclerosis. Major arterial structures remain patent. Portal venous system is patent. No lymphadenopathy. Reproductive: Surgically absent uterus. Ovaries are within normal limits on series 2, image 105). Other:  No pelvic free fluid. Small umbilical hernia with trace fluid is stable. Musculoskeletal: Advanced lumbar spine degeneration including grade 1 spondylolisthesis in the lower lumbar spine with bulky facet hypertrophy. No acute or suspicious osseous lesion identified. There is a chronic oval mixed density soft tissue mass along the anterior lower sacrum which has only mildly increased in size since 2017, measuring up to 4 cm diameter now on series 2, image 102. This is stable since March. IMPRESSION: 1. Positive for an 8 cm left upper lung mass inseparable from the mediastinum compatible with Bronchogenic Carcinoma. 2. Mediastinal lymph nodes remain normal by size criteria. There are three indeterminate small but spiculated right lung nodules, the largest measuring 11 mm in the lower lobe. 3. No metastatic disease identified in the abdomen or pelvis. 4. Suspicion of Cirrhosis with hepatic steatosis. 5. Chronic presacral soft tissue mass favored to be benign myelolipoma. Chronic right renal cyst which had a benign ultrasound appearance in 2018. Sigmoid diverticulosis. Duodenal diverticulum. 6. Calcified coronary artery and Aortic atherosclerosis (ICD10-I70.0). Electronically Signed   By: Genevie Ann M.D.   On: 02/02/2020 20:54   CT HEAD CODE STROKE WO CONTRAST  Result Date: 02/02/2020 CLINICAL DATA:  Code stroke. Slurred speech and left-sided numbness. EXAM: CT HEAD WITHOUT CONTRAST TECHNIQUE: Contiguous axial images were obtained from the base of the skull through the vertex without intravenous contrast. COMPARISON:  None. FINDINGS: Brain: 2 areas moderate edema in the right cerebral hemisphere, lateral frontal and temporoparietal. Although both in the MCA distribution these show preserved cortex, more suggestive of vasogenic than cytotoxic edema. No acute hemorrhage, hydrocephalus, or shift. Vascular: No hyperdense vessel. Skull: Normal. Negative for fracture or focal lesion. Sinuses/Orbits: Bilateral cataract  resection Other: Critical Value/emergent results were called by telephone at the time of interpretation on 02/02/2020 at 9:16 am to provider Merlyn Lot , who verbally acknowledged these results. ASPECTS [facility] Stroke Program Early CT Score) Not scored given the above IMPRESSION: Two areas of edema in the right cerebral hemisphere. Although both are located in the MCA distribution, these have the appearance of vasogenic edema - more or worrisome for mass lesion than infarct. Recommend brain MRI with contrast. Electronically Signed   By: Monte Fantasia M.D.   On: 02/02/2020 09:19  LOS: 1 day   Sherlyn Ebbert  [facility]   Pager on [email or web address]. If 7PM-7AM, please contact night-coverage at [email or web address]     02/03/2020, 3:46 PM

## 2020-02-03 NOTE — Plan of Care (Signed)
Pt informed of possible lung cancer (primary site). Pulmonology consulted along with Neurosugery for brain aneursym.  Problem: Education: Goal: Knowledge of General Education information will improve Description: Including pain rating scale, medication(s)/side effects and non-pharmacologic comfort measures Outcome: Progressing   Problem: Health Behavior/Discharge Planning: Goal: Ability to manage health-related needs will improve Outcome: Progressing   Problem: Clinical Measurements: Goal: Ability to maintain clinical measurements within normal limits will improve Outcome: Progressing Goal: Will remain free from infection Outcome: Progressing Goal: Diagnostic test results will improve Outcome: Progressing Goal: Respiratory complications will improve Outcome: Progressing Goal: Cardiovascular complication will be avoided Outcome: Progressing   Problem: Activity: Goal: Risk for activity intolerance will decrease Outcome: Progressing   Problem: Nutrition: Goal: Adequate nutrition will be maintained Outcome: Progressing   Problem: Coping: Goal: Level of anxiety will decrease Outcome: Progressing   Problem: Elimination: Goal: Will not experience complications related to bowel motility Outcome: Progressing Goal: Will not experience complications related to urinary retention Outcome: Progressing   Problem: Pain Managment: Goal: General experience of comfort will improve Outcome: Progressing   Problem: Safety: Goal: Ability to remain free from injury will improve Outcome: Progressing   Problem: Skin Integrity: Goal: Risk for impaired skin integrity will decrease Outcome: Progressing

## 2020-02-03 NOTE — Consult Note (Signed)
Referring Physician:  No referring provider defined for this encounter.  Primary Physician:  [name], MD  Chief Complaint:  seizure  History of Present Illness: 02/03/2020 Kimberly Huff is a 74 y.o. female who presents with the chief complaint of facial twitching. She presented to ER yesterday, where a head CT revealed two areas of concern for intracranial malignancy.  She was scanned with MRIA, which showed 2 enhancing lesions concerning for metastasis, and a 87m incidental R ophthalmic aneurysm.  She was started on dexamethasone and keppra, and has not had any more episodes.  She denies other neurological symptoms.  She has had some hoarseness in her voice.  Review of Systems:  A 10 point review of systems is negative, except for the pertinent positives and negatives detailed in the HPI.  Past Medical History: Past Medical History:  Diagnosis Date  . Allergy   . Atrophic kidney    left  . Blood clot in vein 45 yrs ago   inabdoment after surgery  . Blood transfusion without reported diagnosis   . Cataract   . Complication of anesthesia   . Coronary artery disease   . Coronary atherosclerosis   . Diabetes mellitus   . Diverticulosis 2005   Dr SJamal Collin . Dyspnea    with exertion  . External hemorrhoids without mention of complication 27741 . GERD (gastroesophageal reflux disease)   . H/O cardiac catheterization 2005  . Heart murmur 2012   found by Dr. SJamal Collin . Hyperlipidemia   . Hypertension   . Kidney stone   . Lesion of bladder   . [place]ocardial [place]  . Neuromuscular disorder ([place]    "achy muscles"  . Obesity   . Osteoarthritis   . PONV (postoperative nausea and vomiting)   . Recurrent UTI   . Spinal stenosis of cervical region   . Steatohepatitis 05/30/10   Brunt Stage 3, non alcoholic cirrhosis of the liver  . Type II or unspecified type diabetes mellitus without mention of complication, uncontrolled    Patient does takes Metformin  and recommended to stop for 48 hours.  .Marland KitchenUlcer   . Unspecified essential hypertension 2014    Past Surgical History: Past Surgical History:  Procedure Laterality Date  . ABDOMINAL HYSTERECTOMY  1982   complete  . BREAST CYST ASPIRATION Right 1990's   benign  . BREAST MASS EXCISION     benign  . CARDIAC CATHETERIZATION  2000   cardiac stent  . cardiac stents  2000  . CATARACT EXTRACTION     both eyes  . COLONOSCOPY  2004, 2015   Dr. SJamal Collin Dr. BOlevia Perches . CYSTOSCOPY W/ RETROGRADES Left 11/12/2015   Procedure: CYSTOSCOPY WITH RETROGRADE PYELOGRAM;  Surgeon: PCleon Gustin MD;  Location: [facility];  Service: Urology;  Laterality: Left;  . CYSTOSCOPY W/ URETERAL STENT PLACEMENT Left 11/12/2015   Procedure: CYSTOSCOPY WITH STENT REPLACEMENT;  Surgeon: PCleon Gustin MD;  Location: [facility];  Service: Urology;  Laterality: Left;  . CYSTOSCOPY WITH BIOPSY N/A 09/26/2019   Procedure: CYSTOSCOPY WITH BIOPSY AND FULGURATION;  Surgeon: MCleon Gustin MD;  [facility]  Service: Urology;  Laterality: N/A;  . CYSTOSCOPY WITH URETEROSCOPY AND STENT PLACEMENT Left 10/15/2015   Procedure: CYSTOSCOPY WITH URETEROSCOPY AND STENT PLACEMENT;  Surgeon: PCleon Gustin MD;  Location: [facility];  Service: Urology;  Laterality: Left;  . EYE SURGERY Bilateral 2012   cataract  . FOOT SURGERY  2008  . PTCA  2000   stent x 1  . ROBOTIC ASSITED PARTIAL NEPHRECTOMY Left 06/02/2019   Procedure: XI ROBOTIC ASSITED NEPHRECTOMY;  Surgeon: Cleon Gustin, MD;  Location: [facility];  Service: Urology;  Laterality: Left;  . SALPINGOOPHORECTOMY    . TONSILLECTOMY    . UPPER GI ENDOSCOPY    . URETEROSCOPY WITH HOLMIUM LASER LITHOTRIPSY Left 11/12/2015   Procedure: URETEROSCOPY WITH HOLMIUM LASER LITHOTRIPSY;  Surgeon: Cleon Gustin, MD;  Location: [facility];  Service: Urology;  Laterality: Left;  . URETEROTOMY  1960/1969   x 2 during childhood  . VAGINAL HYSTERECTOMY       Allergies: Allergies as of 02/02/2020  . (No Known Allergies)    Medications:  Current Facility-Administered Medications:  .  0.9 %  sodium chloride infusion, , Intravenous, Continuous, Agbata, Tochukwu, MD, Last Rate: 75 mL/hr at 02/03/20 0700, Rate Verify at 02/03/20 0700 .  aspirin EC tablet 325 mg, 325 mg, Oral, Daily, Agbata, Tochukwu, MD, 325 mg at 02/03/20 0944 .  dexamethasone (DECADRON) injection 4 mg, 4 mg, Intravenous, Q8H, Jennye Boroughs, MD .  enoxaparin (LOVENOX) injection 40 mg, 40 mg, Subcutaneous, Q24H, Agbata, Tochukwu, MD, 40 mg at 02/02/20 1825 .  furosemide (LASIX) tablet 40 mg, 40 mg, Oral, Daily, Agbata, Tochukwu, MD .  insulin aspart (novoLOG) injection 0-15 Units, 0-15 Units, Subcutaneous, TID WC, Agbata, Tochukwu, MD, 8 Units at 02/03/20 1218 .  levETIRAcetam (KEPPRA) tablet 500 mg, 500 mg, Oral, BID, Greta Doom, MD, 500 mg at 02/03/20 0944 .  losartan (COZAAR) tablet 50 mg, 50 mg, Oral, Daily, Agbata, Tochukwu, MD, 50 mg at 02/03/20 0944 .  ondansetron (ZOFRAN) tablet 4 mg, 4 mg, Oral, Q6H PRN **OR** ondansetron (ZOFRAN) injection 4 mg, 4 mg, Intravenous, Q6H PRN, Agbata, Tochukwu, MD .  pantoprazole (PROTONIX) EC tablet 40 mg, 40 mg, Oral, Daily, Agbata, Tochukwu, MD, 40 mg at 02/03/20 0944 .  rosuvastatin (CRESTOR) tablet 10 mg, 10 mg, Oral, Daily, Agbata, Tochukwu, MD .  senna-docusate (Senokot-S) tablet 2 tablet, 2 tablet, Oral, Daily PRN, Agbata, Tochukwu, MD .  sodium chloride flush (NS) 0.9 % injection 3 mL, 3 mL, Intravenous, Once, Merlyn Lot, MD .  traMADol Veatrice Bourbon) tablet 50 mg, 50 mg, Oral, Daily PRN, Agbata, Tochukwu, MD .  Derrill Memo ON 02/08/2020] Vitamin D (Ergocalciferol) (DRISDOL) capsule 50,000 Units, 50,000 Units, Oral, Q7 days, Agbata, Tochukwu, MD   Social History: Social History   Tobacco Use  . Smoking status: Former Smoker    Packs/day: 0.50    Years: 38.00    Pack years: 19.00    Types: Cigarettes    Quit date:  09/15/1998    Years since quitting: 21.4  . Smokeless tobacco: Never Used  Vaping Use  . Vaping Use: Never used  Substance Use Topics  . Alcohol use: Not Currently  . Drug use: No    Family Medical History: Family History  Problem Relation Age of Onset  . Lung cancer Father   . Heart disease Father   . Diverticulosis Mother   . Diabetes Mother   . Stroke Mother 85  . Autoimmune disease Daughter   . Thyroid disease Daughter   . Thyroid disease Sister   . Stomach cancer Maternal Aunt   . Breast cancer Maternal Aunt   . Colon cancer Neg Hx   . Esophageal cancer Neg Hx   . Rectal cancer Neg Hx     Physical Examination: Vitals:   02/03/20 1151 02/03/20 1524  BP: (!) 129/54 (!) 157/51  Pulse: 65 69  Resp: 17 18  Temp: (!) 97.4 F (36.3 C) 97.7 F (36.5 C)  SpO2: 94% 97%     General: Patient is well developed, well nourished, calm, collected, and in no apparent distress.  Psychiatric: Patient is non-anxious.  Head:  Pupils equal, round, and reactive to light.  ENT:  Oral mucosa appears well hydrated.  Neck:   Supple.  Full range of motion.  Respiratory: Patient is breathing without any difficulty.  Extremities: No edema.  Vascular: Palpable pulses in dorsal pedal vessels.  Skin:   On exposed skin, there are no abnormal skin lesions.  NEUROLOGICAL:  General: In no acute distress.   Awake, alert, oriented to person, place, and time.  Pupils equal round and reactive to light.  Facial tone is symmetric.  Tongue protrusion is midline.  There is no pronator drift.   Strength: Side Biceps Triceps Deltoid Interossei Grip Wrist Ext. Wrist Flex.  R 5 5 5 5 5 5 5   L 5 5 5 5 5 5 5    Side Iliopsoas Quads Hamstring PF DF EHL  R 5 5 5 5 5 5   L 5 5 5 5 5 5    Reflexes are 1+ and symmetric at the biceps, triceps, brachioradialis, patella and achilles.   Bilateral upper and lower extremity sensation is intact to light touch.  Clonus is not present.  Toes are down-going.   Gait is normal.  Hoffman's is absent.  Imaging: MRIA brain 02/02/2020 IMPRESSION: Two enhancing mass lesions are present in the right cerebral hemisphere with surrounding edema and central necrosis compatible with metastatic disease. No midline shift  Mild chronic microvascular ischemic change in the white matter. No acute infarct.  IMPRESSION: No intracranial stenosis or occlusion  6 x 7 mm right Peri ophthalmic artery aneurysm.    Electronically Signed   By: Franchot Gallo M.D.   On: 02/02/2020 11:22  I have personally reviewed the images and agree with the above interpretation.  Labs: CBC Latest Ref Rng & Units 02/03/2020 02/02/2020 09/26/2019  WBC 4.0 - 10.5 K/uL 7.9 7.7 -  Hemoglobin 12.0 - 15.0 g/dL 12.6 13.0 14.3  Hematocrit 36 - 46 % 37.8 40.9 42.0  Platelets 150 - 400 K/uL 169 170 -       Assessment and Plan: Ms. Stirn is a pleasant 74 y.o. female with two lesions in the R hemisphere concerning for metastasis, most likely lung primary given the lung lesion on CT CAP.  She also has an incidentally found R ophthalmic aneurysm.  1. Intracranial metases - agree with lung biopsy - agree with keppra per neurology and dexamethasone per oncology - I would not recommend surgical resection of the lesions.  These will likely be controllable with SRS and/or whole brain XRT.  I defer choice of XRT to Dr. Baruch Gouty.  2. R ophthalmic aneurysm - this is incidental, and has a ~5% risk of hemorrhage in the next 5 years. - follow up in 3 months - I will arrange.  The patient, her daughter, and I had a 30 minute discussion regarding treatment.  Length of consultation including chart review, information gathering, and direct patient care - 60 minutes.    Chrystel Barefield K. Izora Ribas MD, [place] Dept. of Neurosurgery

## 2020-02-04 LAB — GLUCOSE, CAPILLARY
Glucose-Capillary: 246 mg/dL — ABNORMAL HIGH (ref 70–99)
Glucose-Capillary: 319 mg/dL — ABNORMAL HIGH (ref 70–99)
Glucose-Capillary: 162 mg/dL — ABNORMAL HIGH (ref 70–99)
Glucose-Capillary: 218 mg/dL — ABNORMAL HIGH (ref 70–99)

## 2020-02-04 MED ORDER — INSULIN GLARGINE 100 UNIT/ML ~~LOC~~ SOLN
10.0000 [IU] | Freq: Two times a day (BID) | SUBCUTANEOUS | Status: DC
  Administered 2020-02-04 (×2): 10 [IU] via SUBCUTANEOUS
  Filled 2020-02-04 (×4): qty 0.1

## 2020-02-04 NOTE — Progress Notes (Signed)
NSgy and oncology consults noted. Neurology will be available as needed if she has any further concerns for recurrent seizure activity. Please call with any further questions or concerns.   Roland Rack, MD [facility] [phone]  If 7pm- 7am, please page neurology on call as listed in [place].

## 2020-02-04 NOTE — Progress Notes (Signed)
PROGRESS NOTE    Kimberly Huff  NFA:[id number] DOB: 01-Sep-1945 DOA: 02/02/2020 PCP: [name], MD   Brief Narrative:  Kimberly Huff is a 74 y.o. female with medical history significant fordiabetes mellitus, coronary artery disease, dyslipidemia, GERD, status post left nephrectomy in July 2021, pathology was negative for malignancy, history of bladder lesion but pathology was again negative for malignancy, who was brought to the hospital because of brief episode of slurred speech and left facial weakness.  Work-up in the hospital showed 8 cm left lung mass and 2 brain lesions suspicious for metastatic disease.  She was started on Keppra for seizure prophylaxis and she was also started on IV dexamethasone.  She was evaluated by the oncologist, neurologist and and neurosurgeon.  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  Pulmonologist was consulted for further evaluation of left lung mass and biopsy for final diagnosis.  Subjective: Patient has no new complaint when seen today.  She wants to go home. Has a planned trip going to beach on 02/15/2020 and does not want to miss that.  She was accompanied by her daughter in the room.  Assessment & Plan:   Principal Problem:   Brain mass Active Problems:   Obesity (BMI 30-39.9)   Benign essential HTN   Diabetes mellitus ([place])   Obesity (BMI 30.0-34.9)   Brain metastasis ([place]   Brain aneurysm  Concern for lung malignancy with mets to brain.  Patient has an history of prior nephrectomy secondary to chronic pyelonephritis, negative for malignancy.  Oncology, neurology, neurosurgery and pulmonary was consulted.  She was started on IV dexamethasone along with Keppra for seizure prophylaxis and to reduce edema. -Pulmonary is planning for biopsy-appreciate their help. -Continue with steroid at this time. -Discontinue aspirin and Lovenox for a possible  biopsy soon.  Diabetes mellitus.  CBG elevated as patient is on steroid. -Add Lantus 10 units twice daily. -Continue with SSI.  Incidental finding of right periophthalmic artery aneurysm. -Neurosurgery will follow up as an outpatient.  Objective: Vitals:   02/04/20 0504 02/04/20 0758 02/04/20 1215 02/04/20 1534  BP: 115/76 (!) 151/73 (!) 126/51 139/70  Pulse: (!) 51 (!) 57 (!) 50 (!) 59  Resp: 18 18 17 17   Temp: 97.9 F (36.6 C) 98.1 F (36.7 C) 98.3 F (36.8 C) 98.5 F (36.9 C)  TempSrc:  Oral Oral   SpO2: 96% 97% 95% 100%  Weight:      Height:        Intake/Output Summary (Last 24 hours) at 02/04/2020 1739 Last data filed at 02/04/2020 1350 Gross per 24 hour  Intake 840 ml  Output --  Net 840 ml   Filed Weights   02/02/20 0900 02/02/20 0927 02/03/20 0530  Weight: 86.2 kg 86.2 kg 86.1 kg    Examination:  General exam: Appears calm and comfortable  Respiratory system: Clear to auscultation. Respiratory effort normal. Cardiovascular system: S1 & S2 heard, RRR. No JVD, murmurs, Gastrointestinal system: Soft, nontender, nondistended, bowel sounds positive. Central nervous system: Alert and oriented. No focal neurological deficits Extremities: No edema, no cyanosis, pulses intact and symmetrical. Psychiatry: Judgement and insight appear normal. Mood & affect appropriate.    DVT prophylaxis: SCDs Code Status: DNR Family Communication: Daughter was updated at bedside. Disposition Plan:  Status is: Inpatient  Remains inpatient appropriate because:Inpatient level of care appropriate due to severity of illness   Dispo: The patient is from: Home  Anticipated d/c is to: Home              Anticipated d/c date is: 1 day              Patient currently is not medically stable to d/c.  Patient needs lung biopsy, waiting for final pulmonary decision when to proceed.  Consultants:   Pulmonary  Neurology  Neurosurgery  Oncology  Procedures:   Antimicrobials:   Data Reviewed: I have personally reviewed following labs and imaging studies  CBC: Recent Labs  Lab 02/02/20 0925 02/03/20 0541  WBC 7.7 7.9  NEUTROABS 3.7  --   HGB 13.0 12.6  HCT 40.9 37.8  MCV 83.0 79.4*  PLT 170 161   Basic Metabolic Panel: Recent Labs  Lab 01/29/20 0856 02/02/20 0925 02/03/20 0541  NA 139 137 133*  K 5.1 4.2 4.3  CL 101 100 99  CO2 29 27 24   GLUCOSE 169* 177* 248*  BUN 23 16 18   CREATININE 0.78 0.81 0.78  CALCIUM 9.4 9.6 9.2   GFR: Estimated Creatinine Clearance: 62.2 mL/min (by C-G formula based on SCr of 0.78 mg/dL). Liver Function Tests: Recent Labs  Lab 01/29/20 0856 02/02/20 0925  AST 19 29  ALT 25 31  ALKPHOS 43 43  BILITOT 0.6 0.9  PROT 6.7 7.7  ALBUMIN 4.3 4.5   No results for input(s): LIPASE, AMYLASE in the last 168 hours. No results for input(s): AMMONIA in the last 168 hours. Coagulation Profile: Recent Labs  Lab 02/02/20 0925  INR 1.1   Cardiac Enzymes: No results for input(s): CKTOTAL, CKMB, CKMBINDEX, TROPONINI in the last 168 hours. BNP (last 3 results) No results for input(s): PROBNP in the last 8760 hours. HbA1C: Recent Labs    02/02/20 0925  HGBA1C 7.3*   CBG: Recent Labs  Lab 02/03/20 1603 02/03/20 2144 02/04/20 0756 02/04/20 1217 02/04/20 1637  GLUCAP 320* 159* 246* 319* 162*   Lipid Profile: No results for input(s): CHOL, HDL, LDLCALC, TRIG, CHOLHDL, LDLDIRECT in the last 72 hours. Thyroid Function Tests: No results for input(s): TSH, T4TOTAL, FREET4, T3FREE, THYROIDAB in the last 72 hours. Anemia Panel: No results for input(s): VITAMINB12, FOLATE, FERRITIN, TIBC, IRON, RETICCTPCT in the last 72 hours. Sepsis Labs: No results for input(s): PROCALCITON, LATICACIDVEN in the last 168 hours.  Recent Results (from the past 240 hour(s))  Respiratory Panel by RT PCR (Flu A&B, Covid) - Nasopharyngeal Swab     Status: None   Collection Time: 02/02/20  1:22 PM   Specimen:  Nasopharyngeal Swab  Result Value Ref Range Status   SARS Coronavirus 2 by RT PCR NEGATIVE NEGATIVE Final    Comment: (NOTE) SARS-CoV-2 target nucleic acids are NOT DETECTED.  The SARS-CoV-2 RNA is generally detectable in upper respiratoy specimens during the acute phase of infection. The lowest concentration of SARS-CoV-2 viral copies this assay can detect is 131 copies/mL. A negative result does not preclude SARS-Cov-2 infection and should not be used as the sole basis for treatment or other patient management decisions. A negative result may occur with  improper specimen collection/handling, submission of specimen other than nasopharyngeal swab, presence of viral mutation(s) within the areas targeted by this assay, and inadequate number of viral copies (<131 copies/mL). A negative result must be combined with clinical observations, patient history, and epidemiological information. The expected result is Negative.  Fact Sheet for Patients:  PinkCheek.be  Fact Sheet for Healthcare Providers:  GravelBags.it  This test is no t yet approved or cleared  by the [place] and  has been authorized for detection and/or diagnosis of SARS-CoV-2 by FDA under an Emergency Use Authorization (EUA). This EUA will remain  in effect (meaning this test can be used) for the duration of the COVID-19 declaration under Section 564(b)(1) of the Act, 21 U.S.C. section 360bbb-3(b)(1), unless the authorization is terminated or revoked sooner.     Influenza A by PCR NEGATIVE NEGATIVE Final   Influenza B by PCR NEGATIVE NEGATIVE Final    Comment: (NOTE) The Xpert Xpress SARS-CoV-2/FLU/RSV assay is intended as an aid in  the diagnosis of influenza from Nasopharyngeal swab specimens and  should not be used as a sole basis for treatment. Nasal washings and  aspirates are unacceptable for Xpert Xpress SARS-CoV-2/FLU/RSV  testing.  Fact Sheet  for Patients: PinkCheek.be  Fact Sheet for Healthcare Providers: GravelBags.it  This test is not yet approved or cleared by the [place] FDA and  has been authorized for detection and/or diagnosis of SARS-CoV-2 by  FDA under an Emergency Use Authorization (EUA). This EUA will remain  in effect (meaning this test can be used) for the duration of the  Covid-19 declaration under Section 564(b)(1) of the Act, 21  U.S.C. section 360bbb-3(b)(1), unless the authorization is  terminated or revoked. Performed at [facility], [address], [place], [place] [address]      Radiology Studies: CT CHEST ABDOMEN PELVIS W CONTRAST  Result Date: 02/02/2020 CLINICAL DATA:  74 year old female with abnormal brain MRI and CT today compatible with cerebral metastases. Unknown primary. History of left oophorectomy for nonfunctioning left kidney. EXAM: CT CHEST, ABDOMEN, AND PELVIS WITH CONTRAST TECHNIQUE: Multidetector CT imaging of the chest, abdomen and pelvis was performed following the standard protocol during bolus administration of intravenous contrast. CONTRAST:  167m OMNIPAQUE IOHEXOL 300 MG/ML  SOLN COMPARISON:  Brain MRI today. [facility] CT Abdomen and Pelvis 07/28/2019 and earlier. FINDINGS: CT CHEST FINDINGS Cardiovascular: Calcified aortic atherosclerosis. Calcified coronary artery atherosclerosis. No cardiomegaly or pericardial effusion. Mediastinum/Nodes: Left upper lung mass inseparable from the mediastinum including the aortic arch (series 5, image 84). But mediastinal lymph nodes remains small, up to 6 mm short axis. Lungs/Pleura: Lobulated up to 8 cm mass of the medial left upper lung inseparable from the anterior lung apex and mediastinum. No associated left pleural effusion. There is contralateral right lower lobe small but spiculated nodule measuring about 11 mm (series 5, image 102). There are also two  smaller sub solid nodules in the right upper lobe on series 4, image 48 and image 70. Superimposed scattered bilateral subpleural pulmonary scarring. Musculoskeletal: Stable mild T4 superior endplate compression. No acute or suspicious osseous lesion identified in the chest. CT ABDOMEN PELVIS FINDINGS Hepatobiliary: Hepatic steatosis and mildly nodular liver contour (series 2, image 55) suspicious for cirrhosis. No discrete liver lesion. Negative gallbladder. Pancreas: Partially atrophied but otherwise negative. Spleen: Negative.  Incidental splenule (normal variant). Adrenals/Urinary Tract: Normal adrenal glands. Surgically absent left kidney as before. Right renal enhancement and contrast excretion is preserved. An anterior midpole exophytic low-density renal mass measures up to 3.8 cm diameter now, versus 2.2 cm in 2017. This has low to intermediate density by CT. On 2018 renal ultrasound this appeared simple and benign. Negative right ureter. Diminutive and unremarkable urinary bladder. Stomach/Bowel: Diverticulosis of the sigmoid colon. Oral contrast has reached the splenic flexure without obstruction. Decompressed ascending and proximal transverse colon. Negative appendix. Decompressed terminal ileum. No dilated small bowel. Unremarkable stomach. Chronic duodenal diverticulum  near the midline appears inconsequential. No free air. No free fluid. Vascular/Lymphatic: Extensive Aortoiliac calcified atherosclerosis. Major arterial structures remain patent. Portal venous system is patent. No lymphadenopathy. Reproductive: Surgically absent uterus. Ovaries are within normal limits on series 2, image 105). Other: No pelvic free fluid. Small umbilical hernia with trace fluid is stable. Musculoskeletal: Advanced lumbar spine degeneration including grade 1 spondylolisthesis in the lower lumbar spine with bulky facet hypertrophy. No acute or suspicious osseous lesion identified. There is a chronic oval mixed density soft  tissue mass along the anterior lower sacrum which has only mildly increased in size since 2017, measuring up to 4 cm diameter now on series 2, image 102. This is stable since March. IMPRESSION: 1. Positive for an 8 cm left upper lung mass inseparable from the mediastinum compatible with Bronchogenic Carcinoma. 2. Mediastinal lymph nodes remain normal by size criteria. There are three indeterminate small but spiculated right lung nodules, the largest measuring 11 mm in the lower lobe. 3. No metastatic disease identified in the abdomen or pelvis. 4. Suspicion of Cirrhosis with hepatic steatosis. 5. Chronic presacral soft tissue mass favored to be benign myelolipoma. Chronic right renal cyst which had a benign ultrasound appearance in 2018. Sigmoid diverticulosis. Duodenal diverticulum. 6. Calcified coronary artery and Aortic atherosclerosis (ICD10-I70.0). Electronically Signed   By: Genevie Ann M.D.   On: 02/02/2020 20:54    Scheduled Meds: . dexamethasone (DECADRON) injection  4 mg Intravenous Q8H  . furosemide  40 mg Oral Daily  . insulin aspart  0-15 Units Subcutaneous TID WC  . insulin glargine  10 Units Subcutaneous BID  . levETIRAcetam  500 mg Oral BID  . losartan  50 mg Oral Daily  . pantoprazole  40 mg Oral Daily  . rosuvastatin  10 mg Oral Daily  . sodium chloride flush  3 mL Intravenous Once  . [START ON 02/08/2020] Vitamin D (Ergocalciferol)  50,000 Units Oral Q7 days   Continuous Infusions:   LOS: 2 days   Time spent: 30 minutes.  Lorella Nimrod, MD [facility]  If 7PM-7AM, please contact night-coverage [email or web address]  02/04/2020, 5:39 PM   This record has been created using Systems analyst. Errors have been sought and corrected,but may not always be located. Such creation errors do not reflect on the standard of care.

## 2020-02-04 NOTE — Consult Note (Signed)
Pulmonary Medicine          Date: 02/04/2020,   MRN# [id number] Kimberly Huff 11-02-1945     AdmissionWeight: 86.2 kg                 CurrentWeight: 86.1 kg  Referring physician : Dr Meribeth Mattes    CHIEF COMPLAINT:   Lung mass   HISTORY OF PRESENT ILLNESS   As per admission h/p Kimberly Huff is a 74 y.o. female with medical history significant for diabetes mellitus, coronary artery disease, dyslipidemia and GERD who was brought into the emergency room by his sister for evaluation of a transient 5-minute episode of slurred speech and left facial weakness.  Patient states that she tried to drink water but was unable to on her left side and spilled the water.  Symptoms occurred at about 8:19 AM.  She also describes twitching involving the left side of her face lasting about 5 minutes.  Patient states that she had a similar episode the day prior but this resolved.  Patient was concerned that she may be having a stroke and so called her sister who brought her to the emergency room for evaluation.  She arrived to the ER and a code stroke was called. She denies having any chest pain, no shortness of breath no nausea, no vomiting, no dizziness, no lightheadedness, no abdominal pain or any changes in her bowel habits.  She denies having any urinary symptoms. Patient had a left sided nephrectomy done earlier this year for segmental glomerulosclerosis and chronic pyelonephritis.  Pathology was negative for malignancy.  Patient is also status post biopsy of a bladder lesion and pathology was negative for malignancy as well.  Labs show sodium 141, potassium 4.7, chloride 105, bicarb 29, BUN 21, creatinine 0.75, phosphatase 43, AST 12, ALT 15, total protein 6.6, white count 7.7, hemoglobin 13, hematocrit 40.9, MCV 83, RDW 14.3, platelet count 170.  CT scan of the head without contrast shows two areas of edema in the right cerebral hemisphere. Although both are located in the MCA distribution, these  have the appearance of vasogenic edema - more or worrisome for mass lesion than infarct. Recommend brain MRI with contrast. MRI of the brain with and without contrast showed two enhancing mass lesions are present in the right cerebral hemisphere with surrounding edema and central necrosis compatible with metastatic disease. No midline shift. Mild chronic microvascular ischemic change in the white matter. No acute infarct.  CT angiogram showed no intracranial stenosis or occlusion. 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  Twelve-lead EKG reviewed by me shows sinus rhythm with nonspecific T wave changes.  Patient is a 74 year old Caucasian female who presented to the emergency room as a code stroke.  She developed left-sided facial weakness and slurred speech which has completely resolved.  Imaging showed areas of edema in the right cerebral hemisphere located in the MCA distribution suggestive of vasogenic edema and an MRI was recommended.  MRI of the brain done with IV contrast shows 2 enhancing mass lesions in the right cerebral hemisphere with surrounding edema and central necrosis compatible with metastatic disease.  Pulmonary consultation was placed for additional evaluation and guidance. Patient has lung mass which requires biopsy for tissue diagnosis to allow plan for therapy. I have met with patient at bedside to discuss findings and plan of care.  Separately I had spoken with [name] (daughter of patient) to review findings and discuss short and long term plan.  PAST MEDICAL HISTORY   Past Medical History:  Diagnosis Date  . Allergy   . Atrophic kidney    left  . Blood clot in vein 45 yrs ago   inabdoment after surgery  . Blood transfusion without reported diagnosis   . Cataract   . Complication of anesthesia   . Coronary artery disease   . Coronary atherosclerosis   . Diabetes mellitus   . Diverticulosis 2005   Dr Jamal Collin  . Dyspnea    with exertion  . External hemorrhoids  without mention of complication 3419  . GERD (gastroesophageal reflux disease)   . H/O cardiac catheterization 2005  . Heart murmur 2012   found by Dr. Jamal Collin  . Hyperlipidemia   . Hypertension   . Kidney stone   . Lesion of bladder   . Myocardial infarction ([place]) 2000  . Neuromuscular disorder ([place])    "achy muscles"  . Obesity   . Osteoarthritis   . PONV (postoperative nausea and vomiting)   . Recurrent UTI   . Spinal stenosis of cervical region   . Steatohepatitis 05/30/10   Brunt Stage 3, non alcoholic cirrhosis of the liver  . Type II or unspecified type diabetes mellitus without mention of complication, uncontrolled    Patient does takes Metformin and recommended to stop for 48 hours.  Marland Kitchen Ulcer   . Unspecified essential hypertension 2014     SURGICAL HISTORY   Past Surgical History:  Procedure Laterality Date  . ABDOMINAL HYSTERECTOMY  1982   complete  . BREAST CYST ASPIRATION Right 1990's   benign  . BREAST MASS EXCISION     benign  . CARDIAC CATHETERIZATION  2000   cardiac stent  . cardiac stents  2000  . CATARACT EXTRACTION     both eyes  . COLONOSCOPY  2004, 2015   Dr. Jamal Collin, Dr. Olevia Perches  . CYSTOSCOPY W/ RETROGRADES Left 11/12/2015   Procedure: CYSTOSCOPY WITH RETROGRADE PYELOGRAM;  Surgeon: Cleon Gustin, MD;  Location: [facility];  Service: Urology;  Laterality: Left;  . CYSTOSCOPY W/ URETERAL STENT PLACEMENT Left 11/12/2015   Procedure: CYSTOSCOPY WITH STENT REPLACEMENT;  Surgeon: Cleon Gustin, MD;  Location: [facility];  Service: Urology;  Laterality: Left;  . CYSTOSCOPY WITH BIOPSY N/A 09/26/2019   Procedure: CYSTOSCOPY WITH BIOPSY AND FULGURATION;  Surgeon: Cleon Gustin, MD;  Location: [facility];  Service: Urology;  Laterality: N/A;  . CYSTOSCOPY WITH URETEROSCOPY AND STENT PLACEMENT Left 10/15/2015   Procedure: CYSTOSCOPY WITH URETEROSCOPY AND STENT PLACEMENT;  Surgeon: Cleon Gustin, MD;  Location: [facility];  Service:  Urology;  Laterality: Left;  . EYE SURGERY Bilateral 2012   cataract  . FOOT SURGERY  2008  . PTCA  2000   stent x 1  . ROBOTIC ASSITED PARTIAL NEPHRECTOMY Left 06/02/2019   Procedure: XI ROBOTIC ASSITED NEPHRECTOMY;  Surgeon: Cleon Gustin, MD;  Location: [facility];  Service: Urology;  Laterality: Left;  . SALPINGOOPHORECTOMY    . TONSILLECTOMY    . UPPER GI ENDOSCOPY    . URETEROSCOPY WITH HOLMIUM LASER LITHOTRIPSY Left 11/12/2015   Procedure: URETEROSCOPY WITH HOLMIUM LASER LITHOTRIPSY;  Surgeon: Cleon Gustin, MD;  Location: [facility];  Service: Urology;  Laterality: Left;  . URETEROTOMY  1960/1969   x 2 during childhood  . VAGINAL HYSTERECTOMY       FAMILY HISTORY   Family History  Problem Relation Age of Onset  . Lung cancer Father   . Heart disease  Father   . Diverticulosis Mother   . Diabetes Mother   . Stroke Mother 46  . Autoimmune disease Daughter   . Thyroid disease Daughter   . Thyroid disease Sister   . Stomach cancer Maternal Aunt   . Breast cancer Maternal Aunt   . Colon cancer Neg Hx   . Esophageal cancer Neg Hx   . Rectal cancer Neg Hx      SOCIAL HISTORY   Social History   Tobacco Use  . Smoking status: Former Smoker    Packs/day: 0.50    Years: 38.00    Pack years: 19.00    Types: Cigarettes    Quit date: 09/15/1998    Years since quitting: 21.4  . Smokeless tobacco: Never Used  Vaping Use  . Vaping Use: Never used  Substance Use Topics  . Alcohol use: Not Currently  . Drug use: No     MEDICATIONS    Home Medication:    Current Medication:  Current Facility-Administered Medications:  .  aspirin EC tablet 325 mg, 325 mg, Oral, Daily, Agbata, Tochukwu, MD, 325 mg at 02/04/20 0917 .  dexamethasone (DECADRON) injection 4 mg, 4 mg, Intravenous, Q8H, Jennye Boroughs, MD, 4 mg at 02/04/20 0626 .  enoxaparin (LOVENOX) injection 40 mg, 40 mg, Subcutaneous, Q24H, Agbata, Tochukwu, MD, 40 mg at 02/03/20 1734 .  furosemide (LASIX)  tablet 40 mg, 40 mg, Oral, Daily, Agbata, Tochukwu, MD .  insulin aspart (novoLOG) injection 0-15 Units, 0-15 Units, Subcutaneous, TID WC, Agbata, Tochukwu, MD, 5 Units at 02/04/20 0917 .  insulin glargine (LANTUS) injection 10 Units, 10 Units, Subcutaneous, BID, Amin, Soundra Pilon, MD .  levETIRAcetam (KEPPRA) tablet 500 mg, 500 mg, Oral, BID, Greta Doom, MD, 500 mg at 02/04/20 0917 .  losartan (COZAAR) tablet 50 mg, 50 mg, Oral, Daily, Agbata, Tochukwu, MD, 50 mg at 02/04/20 0917 .  ondansetron (ZOFRAN) tablet 4 mg, 4 mg, Oral, Q6H PRN **OR** ondansetron (ZOFRAN) injection 4 mg, 4 mg, Intravenous, Q6H PRN, Agbata, Tochukwu, MD .  pantoprazole (PROTONIX) EC tablet 40 mg, 40 mg, Oral, Daily, Agbata, Tochukwu, MD, 40 mg at 02/04/20 0917 .  rosuvastatin (CRESTOR) tablet 10 mg, 10 mg, Oral, Daily, Agbata, Tochukwu, MD, 10 mg at 02/03/20 2216 .  senna-docusate (Senokot-S) tablet 2 tablet, 2 tablet, Oral, Daily PRN, Agbata, Tochukwu, MD .  sodium chloride flush (NS) 0.9 % injection 3 mL, 3 mL, Intravenous, Once, Merlyn Lot, MD .  traMADol Veatrice Bourbon) tablet 50 mg, 50 mg, Oral, Daily PRN, Agbata, Tochukwu, MD .  Derrill Memo ON 02/08/2020] Vitamin D (Ergocalciferol) (DRISDOL) capsule 50,000 Units, 50,000 Units, Oral, Q7 days, Agbata, Tochukwu, MD    ALLERGIES   Patient has no known allergies.     REVIEW OF SYSTEMS    Review of Systems:  Gen:  Denies  fever, sweats, chills weigh loss  HEENT: Denies blurred vision, double vision, ear pain, eye pain, hearing loss, nose bleeds, sore throat Cardiac:  No dizziness, chest pain or heaviness, chest tightness,edema Resp:   Denies cough or sputum porduction, shortness of breath,wheezing, hemoptysis,  Gi: Denies swallowing difficulty, stomach pain, nausea or vomiting, diarrhea, constipation, bowel incontinence Gu:  Denies bladder incontinence, burning urine Ext:   Denies Joint pain, stiffness or swelling Skin: Denies  skin rash, easy bruising or  bleeding or hives Endoc:  Denies polyuria, polydipsia , polyphagia or weight change Psych:   Denies depression, insomnia or hallucinations   Other:  All other systems negative   VS: BP Marland Kitchen)  151/73 (BP Location: Right Arm)   Pulse (!) 57   Temp 98.1 F (36.7 C) (Oral)   Resp 18   Ht 5' 3"  (1.6 m)   Wt 86.1 kg   SpO2 97%   BMI 33.64 kg/m      PHYSICAL EXAM    GENERAL:NAD, no fevers, chills, no weakness no fatigue HEAD: Normocephalic, atraumatic.  EYES: Pupils equal, round, reactive to light. Extraocular muscles intact. No scleral icterus.  MOUTH: Moist mucosal membrane. Dentition intact. No abscess noted.  EAR, NOSE, THROAT: Clear without exudates. No external lesions.  NECK: Supple. No thyromegaly. No nodules. No JVD.  PULMONARY: Diffuse coarse rhonchi right sided +wheezes CARDIOVASCULAR: S1 and S2. Regular rate and rhythm. No murmurs, rubs, or gallops. No edema. Pedal pulses 2+ bilaterally.  GASTROINTESTINAL: Soft, nontender, nondistended. No masses. Positive bowel sounds. No hepatosplenomegaly.  MUSCULOSKELETAL: No swelling, clubbing, or edema. Range of motion full in all extremities.  NEUROLOGIC: Cranial nerves II through XII are intact. No gross focal neurological deficits. Sensation intact. Reflexes intact.  SKIN: No ulceration, lesions, rashes, or cyanosis. Skin warm and dry. Turgor intact.  PSYCHIATRIC: Mood, affect within normal limits. The patient is awake, alert and oriented x 3. Insight, judgment intact.       IMAGING    MR ANGIO HEAD WO CONTRAST  Result Date: 02/02/2020 CLINICAL DATA:  Seizure. EXAM: MRA HEAD WITHOUT CONTRAST TECHNIQUE: Angiographic images of the Circle of Willis were obtained using MRA technique without intravenous contrast. COMPARISON:  MRI head 02/02/2020 FINDINGS: Left vertebral artery dominant. Both vertebral arteries patent to the basilar without stenosis. Left PICA patent. Probable small right PICA. Small right AICA patent. Basilar widely  patent. Superior cerebellar and posterior cerebral arteries patent without stenosis. Lobulated aneurysm right cavernous carotid in the Peri ophthalmic region. This measures approximately 6 x 7 mm. Cavernous carotid is patent bilaterally without stenosis Anterior and middle cerebral arteries patent bilaterally without stenosis or additional aneurysm. 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 Electronically Signed   By: Franchot Gallo M.D.   On: 02/02/2020 11:28   MR ANGIO NECK W WO CONTRAST  Result Date: 02/02/2020 CLINICAL DATA:  Seizure. EXAM: MRA NECK WITHOUT AND WITH CONTRAST TECHNIQUE: Multiplanar and multiecho pulse sequences of the neck were obtained without and with intravenous contrast. Angiographic images of the neck were obtained using MRA technique without and with intravenous contrast. CONTRAST:  36m GADAVIST GADOBUTROL 1 MMOL/ML IV SOLN COMPARISON:  None. FINDINGS: Normal aortic arch. Proximal great vessels widely patent without stenosis. Carotid bifurcation is normal bilaterally. No significant stenosis or irregularity Antegrade flow in both vertebral arteries. Left vertebral artery dominant. No vertebral artery stenosis. Aneurysm right cavernous carotid. IMPRESSION: Negative MRA neck. Aneurysm right cavernous carotid. Electronically Signed   By: CFranchot GalloM.D.   On: 02/02/2020 11:25   MR BRAIN W WO CONTRAST  Result Date: 02/02/2020 CLINICAL DATA:  Seizure.  Abnormal CT head. EXAM: MRI HEAD WITHOUT AND WITH CONTRAST TECHNIQUE: Multiplanar, multiecho pulse sequences of the brain and surrounding structures were obtained without and with intravenous contrast. CONTRAST:  97mGADAVIST GADOBUTROL 1 MMOL/ML IV SOLN COMPARISON:  CT head 02/02/2020 FINDINGS: Brain: Peripheral enhancing mass lesion in the right middle frontal lobe laterally with central necrosis and surrounding edema. The enhancing mass measures up to 15 mm in diameter. This is  peripherally located. Second peripherally enhancing mass lesion in the right posterior temporal lobe measures 9 mm in diameter with surrounding  vasogenic edema. No third lesion identified. Negative for acute infarct. Scattered small white matter hyperintensities compatible with mild chronic microvascular ischemia. Negative for hydrocephalus or hemorrhage. Vascular: Normal arterial flow voids Skull and upper cervical spine: No focal skeletal lesion. Sinuses/Orbits: Mild mucosal edema paranasal sinuses. Bilateral cataract extraction Other: None IMPRESSION: Two enhancing mass lesions are present in the right cerebral hemisphere with surrounding edema and central necrosis compatible with metastatic disease. No midline shift Mild chronic microvascular ischemic change in the white matter. No acute infarct. Electronically Signed   By: Franchot Gallo M.D.   On: 02/02/2020 11:22   CT CHEST ABDOMEN PELVIS W CONTRAST  Result Date: 02/02/2020 CLINICAL DATA:  74 year old female with abnormal brain MRI and CT today compatible with cerebral metastases. Unknown primary. History of left oophorectomy for nonfunctioning left kidney. EXAM: CT CHEST, ABDOMEN, AND PELVIS WITH CONTRAST TECHNIQUE: Multidetector CT imaging of the chest, abdomen and pelvis was performed following the standard protocol during bolus administration of intravenous contrast. CONTRAST:  123m OMNIPAQUE IOHEXOL 300 MG/ML  SOLN COMPARISON:  Brain MRI today. [facility] CT Abdomen and Pelvis 07/28/2019 and earlier. FINDINGS: CT CHEST FINDINGS Cardiovascular: Calcified aortic atherosclerosis. Calcified coronary artery atherosclerosis. No cardiomegaly or pericardial effusion. Mediastinum/Nodes: Left upper lung mass inseparable from the mediastinum including the aortic arch (series 5, image 84). But mediastinal lymph nodes remains small, up to 6 mm short axis. Lungs/Pleura: Lobulated up to 8 cm mass of the medial left upper lung inseparable from the  anterior lung apex and mediastinum. No associated left pleural effusion. There is contralateral right lower lobe small but spiculated nodule measuring about 11 mm (series 5, image 102). There are also two smaller sub solid nodules in the right upper lobe on series 4, image 48 and image 70. Superimposed scattered bilateral subpleural pulmonary scarring. Musculoskeletal: Stable mild T4 superior endplate compression. No acute or suspicious osseous lesion identified in the chest. CT ABDOMEN PELVIS FINDINGS Hepatobiliary: Hepatic steatosis and mildly nodular liver contour (series 2, image 55) suspicious for cirrhosis. No discrete liver lesion. Negative gallbladder. Pancreas: Partially atrophied but otherwise negative. Spleen: Negative.  Incidental splenule (normal variant). Adrenals/Urinary Tract: Normal adrenal glands. Surgically absent left kidney as before. Right renal enhancement and contrast excretion is preserved. An anterior midpole exophytic low-density renal mass measures up to 3.8 cm diameter now, versus 2.2 cm in 2017. This has low to intermediate density by CT. On 2018 renal ultrasound this appeared simple and benign. Negative right ureter. Diminutive and unremarkable urinary bladder. Stomach/Bowel: Diverticulosis of the sigmoid colon. Oral contrast has reached the splenic flexure without obstruction. Decompressed ascending and proximal transverse colon. Negative appendix. Decompressed terminal ileum. No dilated small bowel. Unremarkable stomach. Chronic duodenal diverticulum near the midline appears inconsequential. No free air. No free fluid. Vascular/Lymphatic: Extensive Aortoiliac calcified atherosclerosis. Major arterial structures remain patent. Portal venous system is patent. No lymphadenopathy. Reproductive: Surgically absent uterus. Ovaries are within normal limits on series 2, image 105). Other: No pelvic free fluid. Small umbilical hernia with trace fluid is stable. Musculoskeletal: Advanced lumbar  spine degeneration including grade 1 spondylolisthesis in the lower lumbar spine with bulky facet hypertrophy. No acute or suspicious osseous lesion identified. There is a chronic oval mixed density soft tissue mass along the anterior lower sacrum which has only mildly increased in size since 2017, measuring up to 4 cm diameter now on series 2, image 102. This is stable since March. IMPRESSION: 1. Positive for an 8 cm left upper lung mass inseparable from  the mediastinum compatible with Bronchogenic Carcinoma. 2. Mediastinal lymph nodes remain normal by size criteria. There are three indeterminate small but spiculated right lung nodules, the largest measuring 11 mm in the lower lobe. 3. No metastatic disease identified in the abdomen or pelvis. 4. Suspicion of Cirrhosis with hepatic steatosis. 5. Chronic presacral soft tissue mass favored to be benign myelolipoma. Chronic right renal cyst which had a benign ultrasound appearance in 2018. Sigmoid diverticulosis. Duodenal diverticulum. 6. Calcified coronary artery and Aortic atherosclerosis (ICD10-I70.0). Electronically Signed   By: Genevie Ann M.D.   On: 02/02/2020 20:54   CT HEAD CODE STROKE WO CONTRAST  Result Date: 02/02/2020 CLINICAL DATA:  Code stroke. Slurred speech and left-sided numbness. EXAM: CT HEAD WITHOUT CONTRAST TECHNIQUE: Contiguous axial images were obtained from the base of the skull through the vertex without intravenous contrast. COMPARISON:  None. FINDINGS: Brain: 2 areas moderate edema in the right cerebral hemisphere, lateral frontal and temporoparietal. Although both in the MCA distribution these show preserved cortex, more suggestive of vasogenic than cytotoxic edema. No acute hemorrhage, hydrocephalus, or shift. Vascular: No hyperdense vessel. Skull: Normal. Negative for fracture or focal lesion. Sinuses/Orbits: Bilateral cataract resection Other: Critical Value/emergent results were called by telephone at the time of interpretation on  02/02/2020 at 9:16 am to provider Merlyn Lot , who verbally acknowledged these results. ASPECTS [facility] Stroke Program Early CT Score) Not scored given the above IMPRESSION: Two areas of edema in the right cerebral hemisphere. Although both are located in the MCA distribution, these have the appearance of vasogenic edema - more or worrisome for mass lesion than infarct. Recommend brain MRI with contrast. Electronically Signed   By: Monte Fantasia M.D.   On: 02/02/2020 09:19      ASSESSMENT/PLAN   Left upper lobe lung mass    - most likely malignancy with additional findings of brain lesions suspicious for mets    - patient would need tissue diagnosis for therapy     -have discussed with patient she is agreeable to plan   - have reached out to daughter to discuss details - she understands and all questions were answered   - plan for bronchoscopic evaluation and biopsy of mass   -Complications include: bleeding, cough, infection, pneumothorax, pneumomediastinum, possible need for chest tube and intubation with mechanical ventilation and rarely death.    - Patient relates understanding above and all additional questions have been answered.     -Discussed with pulmonary RN staff to make stat appt for presurgical pulmonary evaluation      Thank you for allowing me to participate in the care of this patient.   Patient/Family are satisfied with care plan and all questions have been answered.  This document was prepared using Dragon voice recognition software and may include unintentional dictation errors.     Ottie Glazier, M.D.  Division of [place]

## 2020-02-05 ENCOUNTER — Ambulatory Visit: Payer: PPO | Admitting: Internal Medicine

## 2020-02-05 ENCOUNTER — Other Ambulatory Visit: Payer: Self-pay | Admitting: *Deleted

## 2020-02-05 ENCOUNTER — Inpatient Hospital Stay: Payer: PPO

## 2020-02-05 ENCOUNTER — Telehealth: Payer: Self-pay | Admitting: Internal Medicine

## 2020-02-05 ENCOUNTER — Institutional Professional Consult (permissible substitution): Payer: PPO | Admitting: Radiation Oncology

## 2020-02-05 DIAGNOSIS — C7931 Secondary malignant neoplasm of brain: Secondary | ICD-10-CM

## 2020-02-05 LAB — GLUCOSE, CAPILLARY
Glucose-Capillary: 135 mg/dL — ABNORMAL HIGH (ref 70–99)
Glucose-Capillary: 167 mg/dL — ABNORMAL HIGH (ref 70–99)
Glucose-Capillary: 204 mg/dL — ABNORMAL HIGH (ref 70–99)
Glucose-Capillary: 233 mg/dL — ABNORMAL HIGH (ref 70–99)
Glucose-Capillary: 250 mg/dL — ABNORMAL HIGH (ref 70–99)

## 2020-02-05 MED ORDER — IPRATROPIUM-ALBUTEROL 0.5-2.5 (3) MG/3ML IN SOLN
3.0000 mL | RESPIRATORY_TRACT | Status: DC | PRN
  Administered 2020-02-05: 3 mL via RESPIRATORY_TRACT
  Filled 2020-02-05: qty 3

## 2020-02-05 MED ORDER — LEVETIRACETAM 500 MG PO TABS: 500.0000 mg | ORAL_TABLET | Freq: Two times a day (BID) | ORAL | 0 refills | Status: DC

## 2020-02-05 MED ORDER — INSULIN GLARGINE 100 UNIT/ML ~~LOC~~ SOLN
12.0000 [IU] | Freq: Two times a day (BID) | SUBCUTANEOUS | Status: DC
  Administered 2020-02-05: 12 [IU] via SUBCUTANEOUS
  Filled 2020-02-05 (×3): qty 0.12

## 2020-02-05 MED ORDER — DEXAMETHASONE 4 MG PO TABS: 4.0000 mg | ORAL_TABLET | Freq: Three times a day (TID) | ORAL | 0 refills | Status: DC

## 2020-02-05 MED ORDER — DEXAMETHASONE 4 MG PO TABS
4.0000 mg | ORAL_TABLET | Freq: Three times a day (TID) | ORAL | Status: DC
  Administered 2020-02-05 (×2): 4 mg via ORAL
  Filled 2020-02-05 (×5): qty 1

## 2020-02-05 MED ORDER — GADOBUTROL 1 MMOL/ML IV SOLN
9.0000 mL | Freq: Once | INTRAVENOUS | Status: AC | PRN
  Administered 2020-02-05: 9 mL via INTRAVENOUS

## 2020-02-05 NOTE — Progress Notes (Signed)
Kimberly Huff   DOB:1945/05/12   AU#:[id number]    Subjective: No acute events overnight.  Patient complains of shortness of breath with exertion.  Otherwise no cough or hemoptysis.  No seizure-like activity.  Objective:  Vitals:   02/05/20 1137 02/05/20 1532  BP: 135/67 (!) 130/51  Pulse: (!) 54 (!) 55  Resp: 17 18  Temp: 98.3 F (36.8 C) 98.2 F (36.8 C)  SpO2: 96% 97%     Intake/Output Summary (Last 24 hours) at 02/05/2020 1619 Last data filed at 02/05/2020 1341 Gross per 24 hour  Intake 960 ml  Output --  Net 960 ml    Physical Exam Constitutional:      Comments: Patient alone.  No acute distress.  HENT:     Head: Normocephalic and atraumatic.     Mouth/Throat:     Pharynx: No oropharyngeal exudate.  Eyes:     Pupils: Pupils are equal, round, and reactive to light.  Cardiovascular:     Rate and Rhythm: Normal rate and regular rhythm.  Pulmonary:     Effort: No respiratory distress.     Breath sounds: No wheezing.     Comments: Decreased air entry bilaterally. Abdominal:     General: Bowel sounds are normal. There is no distension.     Palpations: Abdomen is soft. There is no mass.     Tenderness: There is no abdominal tenderness. There is no guarding or rebound.  Musculoskeletal:        General: No tenderness. Normal range of motion.     Cervical back: Normal range of motion and neck supple.  Skin:    General: Skin is warm.  Neurological:     Mental Status: She is alert and oriented to person, place, and time.  Psychiatric:        Mood and Affect: Affect normal.      Labs:  Lab Results  Component Value Date   WBC 7.9 02/03/2020   HGB 12.6 02/03/2020   HCT 37.8 02/03/2020   MCV 79.4 (L) 02/03/2020   PLT 169 02/03/2020   NEUTROABS 3.7 02/02/2020    Lab Results  Component Value Date   NA 133 (L) 02/03/2020   K 4.3 02/03/2020   CL 99 02/03/2020   CO2 24 02/03/2020    Studies:  MR BRAIN W WO CONTRAST  Result Date: 02/05/2020 CLINICAL DATA:   Brain mass or lesion.  Recent seizure. EXAM: MRI HEAD WITHOUT AND WITH CONTRAST TECHNIQUE: Multiplanar, multiecho pulse sequences of the brain and surrounding structures were obtained without and with intravenous contrast. CONTRAST:  35m GADAVIST GADOBUTROL 1 MMOL/ML IV SOLN COMPARISON:  02/02/2020 FINDINGS: Brain: Diffusion imaging does not show any acute or subacute infarction or other cause of restricted diffusion. No abnormality is seen affecting the brainstem or cerebellum. The left cerebral hemisphere shows mild to moderate chronic small-vessel ischemic changes of the white matter. No cortical or large vessel territory infarction. No evidence of mass or abnormal enhancement affecting the left hemisphere. On the right, there are 2 enhancing mass lesions with central necrosis. Right posterior temporal lobe lesion axial image 75 measures 1 cm in diameter. Peripheral right frontal operculum lesion axial image 103 also show central necrosis, measuring up to 13 mm in diameter. Both are associated with vasogenic edema. No third lesion identified. Mild to moderate chronic small-vessel changes present within the white matter on the right. Vascular: Major vessels at the base of the brain show flow. Skull and upper cervical spine: Negative Sinuses/Orbits:  Clear/normal Other: None IMPRESSION: 1. Two enhancing mass lesions with central necrosis located in the right frontal operculum (13 mm) and right posterior temporal lobe (10 mm) consistent with metastatic disease. Surrounding vasogenic edema. No third lesion identified. No change since 3 days ago. 2. Mild to moderate chronic small-vessel ischemic changes of the cerebral hemispheric white matter. Electronically Signed   By: Nelson Chimes M.D.   On: 02/05/2020 15:19    Brain metastasis [facility]) #74 year old female patient with a history of solitary kidney; diabetes obesity; prior history of smoking is currently admitted to hospital for difficulty in speech/facial  twitching  #Left lung mass/invading the mediastinum highly suspicious for lung cancer.  S/p evaluation with pulmonary.  Discussed that bronchoscopy/biopsy could not be done until at least 1 week [last aspirin 325 mg a day on 10/03].  Alternatively will check with IR if CT-guided biopsy could be done sooner.  #Seizure secondary to brain metastases/edema x2 [10 to 15 millimeters]; improvement of symptoms noted.  S/p evaluation with neurology/neurosurgery.  Also discussed with radiation oncology for consideration of radiation.  Continue steroids dexamethasone 4 mg 3 times daily.  Continue Keppra.  Discussed that she is not supposed to drive at this time with her prior medical clearance.  #Solitary kidney-normal renal function.  #DNR/DNI  #Discussed above plan with patient and daughter at length.  They are in agreement.  Patient wants to go to the beach around 14th October; discussed that we will try our best to adjust her appointment/schedules around her trip.    [name], MD 02/05/2020  4:19 PM

## 2020-02-05 NOTE — Discharge Summary (Signed)
Physician Discharge Summary  Kimberly Huff YPP:[id number] DOB: 1946-01-30 DOA: 02/02/2020  PCP: [name], MD  Admit date: 02/02/2020 Discharge date: 02/05/2020  Admitted From: Home Disposition:  Home  Recommendations for Outpatient Follow-up:  1. Follow up with PCP in 1-2 weeks 2. Follow-up with pulmonology. 3. Follow-up with oncology 4. Please obtain BMP/CBC in one week 5. Please follow up on the following pending results: None  Home Health: No Equipment/Devices: None Discharge Condition: Fair CODE STATUS: DNR Diet recommendation: Heart Healthy / Carb Modified   Brief/Interim Summary: Kimberly Huff a 74 y.o.femalewith medical history significant fordiabetes mellitus, coronary artery disease, dyslipidemia,GERD, status post left nephrectomy in July 2021, pathology was negative for malignancy, history of bladder lesion but pathology was again negative for malignancy,who was brought to the hospital because of brief episode of slurred speech and left facial weakness. Work-up in the hospital showed 8 cm left lung mass and 2 brain lesions suspicious for metastatic disease.  She was started on Keppra for seizure prophylaxis and she was also started on IV dexamethasone.  Later transition to Decadron.  She will continue with Keppra and should avoid driving.  She was evaluated by the oncologist, neurologist and and neurosurgeon.  Patient need biopsy of her lung lesion for definitive diagnosis.  Aspirin was held on 02/04/2020.  She needs at least 5 to 7 days of washout.  Repeat MRI was also done at the request of oncologist. She will follow up with pulmonology as an outpatient in the next few days for a possible biopsy later in the week.  MRI shows a incidental finding of right periophthalmic artery aneurysm.  Neurosurgery will arrange follow-up as an outpatient for surveillance.  Patient will continue with rest of her home meds and follow-up with her providers.  Discharge  Diagnoses:  Principal Problem:   Brain mass Active Problems:   Obesity (BMI 30-39.9)   Benign essential HTN   Diabetes mellitus ([place])   Obesity (BMI 30.0-34.9)   Brain metastasis ([place])   Brain aneurysm   Discharge Instructions  Discharge Instructions    Diet - low sodium heart healthy   Complete by: As directed    Discharge instructions   Complete by: As directed    It was pleasure taking care of you. Please keep holding aspirin until you have your biopsy done. Please watch your diet very carefully as you are on steroid which can increase your blood glucose level. Please follow-up with your pulmonologist according to your scheduled appointment. Follow-up with oncologist.   Increase activity slowly   Complete by: As directed      Allergies as of 02/05/2020   No Known Allergies     Medication List    STOP taking these medications   aspirin 325 MG tablet   cefdinir 300 MG capsule Commonly known as: OMNICEF   ibuprofen 800 MG tablet Commonly known as: ADVIL   nitrofurantoin 50 MG capsule Commonly known as: MACRODANTIN     TAKE these medications   Cinnamon 500 MG capsule Take 500 mg by mouth daily. 1000 mg bid   dexamethasone 4 MG tablet Commonly known as: DECADRON Take 1 tablet (4 mg total) by mouth every 8 (eight) hours.   furosemide 40 MG tablet Commonly known as: LASIX TAKE ONE TABLET EVERY DAY   glucose blood test strip Commonly known as: ONE TOUCH ULTRA TEST Use as instructed   levETIRAcetam 500 MG tablet Commonly known as: KEPPRA Take 1 tablet (500 mg total) by mouth 2 (  two) times daily.   loperamide 2 MG tablet Commonly known as: IMODIUM A-D Take 4 mg by mouth 2 (two) times daily as needed for diarrhea or loose stools.   losartan 50 MG tablet Commonly known as: COZAAR TAKE ONE TABLET EVERY DAY   metFORMIN 850 MG tablet Commonly known as: GLUCOPHAGE TAKE ONE TABLET TWICE DAILY WITH A MEAL What changed: See the new instructions.    omeprazole 40 MG capsule Commonly known as: PRILOSEC TAKE 1 CAPSULE BY MOUTH ONCE DAILY   rosuvastatin 10 MG tablet Commonly known as: CRESTOR TAKE ONE TABLET EVERY DAY   senna-docusate 8.6-50 MG tablet Commonly known as: Senokot-S Take 2 tablets by mouth daily as needed for mild constipation.   traMADol 50 MG tablet Commonly known as: Ultram Take 1 tablet (50 mg total) by mouth daily as needed.   Vitamin D (Ergocalciferol) 1.25 MG (50000 UNIT) Caps capsule Commonly known as: DRISDOL TAKE ONE CAPSULE EACH WEEK What changed: See the new instructions.       Follow-up Information    [name], MD. Schedule an appointment as soon as possible for a visit.   Specialty: Internal Medicine Contact information: [place] [address] [phone]        Ottie Glazier, MD. Schedule an appointment as soon as possible for a visit.   Specialty: Pulmonary Disease Contact information: [place] [address] [phone]              No Known Allergies  Consultations:  Pulmonary  Neurosurgery  Oncology  Procedures/Studies: MR ANGIO HEAD WO CONTRAST  Result Date: 02/02/2020 CLINICAL DATA:  Seizure. EXAM: MRA HEAD WITHOUT CONTRAST TECHNIQUE: Angiographic images of the Circle of Willis were obtained using MRA technique without intravenous contrast. COMPARISON:  MRI head 02/02/2020 FINDINGS: Left vertebral artery dominant. Both vertebral arteries patent to the basilar without stenosis. Left PICA patent. Probable small right PICA. Small right AICA patent. Basilar widely patent. Superior cerebellar and posterior cerebral arteries patent without stenosis. Lobulated aneurysm right cavernous carotid in the Peri ophthalmic region. This measures approximately 6 x 7 mm. Cavernous carotid is patent bilaterally without stenosis Anterior and middle cerebral arteries patent bilaterally without stenosis or additional aneurysm.  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 Electronically Signed   By: Franchot Gallo M.D.   On: 02/02/2020 11:28   MR ANGIO NECK W WO CONTRAST  Result Date: 02/02/2020 CLINICAL DATA:  Seizure. EXAM: MRA NECK WITHOUT AND WITH CONTRAST TECHNIQUE: Multiplanar and multiecho pulse sequences of the neck were obtained without and with intravenous contrast. Angiographic images of the neck were obtained using MRA technique without and with intravenous contrast. CONTRAST:  33m GADAVIST GADOBUTROL 1 MMOL/ML IV SOLN COMPARISON:  None. FINDINGS: Normal aortic arch. Proximal great vessels widely patent without stenosis. Carotid bifurcation is normal bilaterally. No significant stenosis or irregularity Antegrade flow in both vertebral arteries. Left vertebral artery dominant. No vertebral artery stenosis. Aneurysm right cavernous carotid. IMPRESSION: Negative MRA neck. Aneurysm right cavernous carotid. Electronically Signed   By: CFranchot GalloM.D.   On: 02/02/2020 11:25   MR BRAIN W WO CONTRAST  Result Date: 02/05/2020 CLINICAL DATA:  Brain mass or lesion.  Recent seizure. EXAM: MRI HEAD WITHOUT AND WITH CONTRAST TECHNIQUE: Multiplanar, multiecho pulse sequences of the brain and surrounding structures were obtained without and with intravenous contrast. CONTRAST:  986mGADAVIST GADOBUTROL 1 MMOL/ML IV SOLN COMPARISON:  02/02/2020 FINDINGS:  Brain: Diffusion imaging does not show any acute or subacute infarction or other cause of restricted diffusion. No abnormality is seen affecting the brainstem or cerebellum. The left cerebral hemisphere shows mild to moderate chronic small-vessel ischemic changes of the white matter. No cortical or large vessel territory infarction. No evidence of mass or abnormal enhancement affecting the left hemisphere. On the right, there are 2 enhancing mass lesions with central necrosis. Right posterior temporal lobe lesion axial image 75  measures 1 cm in diameter. Peripheral right frontal operculum lesion axial image 103 also show central necrosis, measuring up to 13 mm in diameter. Both are associated with vasogenic edema. No third lesion identified. Mild to moderate chronic small-vessel changes present within the white matter on the right. Vascular: Major vessels at the base of the brain show flow. Skull and upper cervical spine: Negative Sinuses/Orbits: Clear/normal Other: None IMPRESSION: 1. Two enhancing mass lesions with central necrosis located in the right frontal operculum (13 mm) and right posterior temporal lobe (10 mm) consistent with metastatic disease. Surrounding vasogenic edema. No third lesion identified. No change since 3 days ago. 2. Mild to moderate chronic small-vessel ischemic changes of the cerebral hemispheric white matter. Electronically Signed   By: Nelson Chimes M.D.   On: 02/05/2020 15:19   MR BRAIN W WO CONTRAST  Result Date: 02/02/2020 CLINICAL DATA:  Seizure.  Abnormal CT head. EXAM: MRI HEAD WITHOUT AND WITH CONTRAST TECHNIQUE: Multiplanar, multiecho pulse sequences of the brain and surrounding structures were obtained without and with intravenous contrast. CONTRAST:  64m GADAVIST GADOBUTROL 1 MMOL/ML IV SOLN COMPARISON:  CT head 02/02/2020 FINDINGS: Brain: Peripheral enhancing mass lesion in the right middle frontal lobe laterally with central necrosis and surrounding edema. The enhancing mass measures up to 15 mm in diameter. This is peripherally located. Second peripherally enhancing mass lesion in the right posterior temporal lobe measures 9 mm in diameter with surrounding vasogenic edema. No third lesion identified. Negative for acute infarct. Scattered small white matter hyperintensities compatible with mild chronic microvascular ischemia. Negative for hydrocephalus or hemorrhage. Vascular: Normal arterial flow voids Skull and upper cervical spine: No focal skeletal lesion. Sinuses/Orbits: Mild mucosal edema  paranasal sinuses. Bilateral cataract extraction Other: None IMPRESSION: Two enhancing mass lesions are present in the right cerebral hemisphere with surrounding edema and central necrosis compatible with metastatic disease. No midline shift Mild chronic microvascular ischemic change in the white matter. No acute infarct. Electronically Signed   By: CFranchot GalloM.D.   On: 02/02/2020 11:22   CT CHEST ABDOMEN PELVIS W CONTRAST  Result Date: 02/02/2020 CLINICAL DATA:  74year old female with abnormal brain MRI and CT today compatible with cerebral metastases. Unknown primary. History of left oophorectomy for nonfunctioning left kidney. EXAM: CT CHEST, ABDOMEN, AND PELVIS WITH CONTRAST TECHNIQUE: Multidetector CT imaging of the chest, abdomen and pelvis was performed following the standard protocol during bolus administration of intravenous contrast. CONTRAST:  1051mOMNIPAQUE IOHEXOL 300 MG/ML  SOLN COMPARISON:  Brain MRI today. [facility] CT Abdomen and Pelvis 07/28/2019 and earlier. FINDINGS: CT CHEST FINDINGS Cardiovascular: Calcified aortic atherosclerosis. Calcified coronary artery atherosclerosis. No cardiomegaly or pericardial effusion. Mediastinum/Nodes: Left upper lung mass inseparable from the mediastinum including the aortic arch (series 5, image 84). But mediastinal lymph nodes remains small, up to 6 mm short axis. Lungs/Pleura: Lobulated up to 8 cm mass of the medial left upper lung inseparable from the anterior lung apex and mediastinum. No associated left pleural effusion. There is contralateral right lower  lobe small but spiculated nodule measuring about 11 mm (series 5, image 102). There are also two smaller sub solid nodules in the right upper lobe on series 4, image 48 and image 70. Superimposed scattered bilateral subpleural pulmonary scarring. Musculoskeletal: Stable mild T4 superior endplate compression. No acute or suspicious osseous lesion identified in the chest. CT  ABDOMEN PELVIS FINDINGS Hepatobiliary: Hepatic steatosis and mildly nodular liver contour (series 2, image 55) suspicious for cirrhosis. No discrete liver lesion. Negative gallbladder. Pancreas: Partially atrophied but otherwise negative. Spleen: Negative.  Incidental splenule (normal variant). Adrenals/Urinary Tract: Normal adrenal glands. Surgically absent left kidney as before. Right renal enhancement and contrast excretion is preserved. An anterior midpole exophytic low-density renal mass measures up to 3.8 cm diameter now, versus 2.2 cm in 2017. This has low to intermediate density by CT. On 2018 renal ultrasound this appeared simple and benign. Negative right ureter. Diminutive and unremarkable urinary bladder. Stomach/Bowel: Diverticulosis of the sigmoid colon. Oral contrast has reached the splenic flexure without obstruction. Decompressed ascending and proximal transverse colon. Negative appendix. Decompressed terminal ileum. No dilated small bowel. Unremarkable stomach. Chronic duodenal diverticulum near the midline appears inconsequential. No free air. No free fluid. Vascular/Lymphatic: Extensive Aortoiliac calcified atherosclerosis. Major arterial structures remain patent. Portal venous system is patent. No lymphadenopathy. Reproductive: Surgically absent uterus. Ovaries are within normal limits on series 2, image 105). Other: No pelvic free fluid. Small umbilical hernia with trace fluid is stable. Musculoskeletal: Advanced lumbar spine degeneration including grade 1 spondylolisthesis in the lower lumbar spine with bulky facet hypertrophy. No acute or suspicious osseous lesion identified. There is a chronic oval mixed density soft tissue mass along the anterior lower sacrum which has only mildly increased in size since 2017, measuring up to 4 cm diameter now on series 2, image 102. This is stable since March. IMPRESSION: 1. Positive for an 8 cm left upper lung mass inseparable from the mediastinum  compatible with Bronchogenic Carcinoma. 2. Mediastinal lymph nodes remain normal by size criteria. There are three indeterminate small but spiculated right lung nodules, the largest measuring 11 mm in the lower lobe. 3. No metastatic disease identified in the abdomen or pelvis. 4. Suspicion of Cirrhosis with hepatic steatosis. 5. Chronic presacral soft tissue mass favored to be benign myelolipoma. Chronic right renal cyst which had a benign ultrasound appearance in 2018. Sigmoid diverticulosis. Duodenal diverticulum. 6. Calcified coronary artery and Aortic atherosclerosis (ICD10-I70.0). Electronically Signed   By: Genevie Ann M.D.   On: 02/02/2020 20:54   CT HEAD CODE STROKE WO CONTRAST  Result Date: 02/02/2020 CLINICAL DATA:  Code stroke. Slurred speech and left-sided numbness. EXAM: CT HEAD WITHOUT CONTRAST TECHNIQUE: Contiguous axial images were obtained from the base of the skull through the vertex without intravenous contrast. COMPARISON:  None. FINDINGS: Brain: 2 areas moderate edema in the right cerebral hemisphere, lateral frontal and temporoparietal. Although both in the MCA distribution these show preserved cortex, more suggestive of vasogenic than cytotoxic edema. No acute hemorrhage, hydrocephalus, or shift. Vascular: No hyperdense vessel. Skull: Normal. Negative for fracture or focal lesion. Sinuses/Orbits: Bilateral cataract resection Other: Critical Value/emergent results were called by telephone at the time of interpretation on 02/02/2020 at 9:16 am to provider Merlyn Lot , who verbally acknowledged these results. ASPECTS [facility] Stroke Program Early CT Score) Not scored given the above IMPRESSION: Two areas of edema in the right cerebral hemisphere. Although both are located in the MCA distribution, these have the appearance of vasogenic edema - more or worrisome  for mass lesion than infarct. Recommend brain MRI with contrast. Electronically Signed   By: Monte Fantasia M.D.   On: 02/02/2020  09:19     Subjective: Patient was feeling better when seen today.  She does become little short of breath while walking to the bathroom.  She was refusing her home dose of Lasix stating that she does not want to make multiple bathroom trip.  Advised to continue taking Lasix.  She wants to be discharged and will follow up for biopsy as an outpatient.  Discharge Exam: Vitals:   02/05/20 1137 02/05/20 1532  BP: 135/67 (!) 130/51  Pulse: (!) 54 (!) 55  Resp: 17 18  Temp: 98.3 F (36.8 C) 98.2 F (36.8 C)  SpO2: 96% 97%   Vitals:   02/05/20 0423 02/05/20 0806 02/05/20 1137 02/05/20 1532  BP: (!) 151/84 (!) 159/80 135/67 (!) 130/51  Pulse: 82 (!) 48 (!) 54 (!) 55  Resp: 19 18 17 18   Temp: 98.2 F (36.8 C) 98.6 F (37 C) 98.3 F (36.8 C) 98.2 F (36.8 C)  TempSrc: Oral Oral Oral Oral  SpO2: 97% 96% 96% 97%  Weight: 85.9 kg     Height:        General: Pt is alert, awake, not in acute distress Cardiovascular: RRR, S1/S2 +, no rubs, no gallops Respiratory: CTA bilaterally, no wheezing, no rhonchi Abdominal: Soft, NT, ND, bowel sounds + Extremities: no edema, no cyanosis   The results of significant diagnostics from this hospitalization (including imaging, microbiology, ancillary and laboratory) are listed below for reference.    Microbiology: Recent Results (from the past 240 hour(s))  Respiratory Panel by RT PCR (Flu A&B, Covid) - Nasopharyngeal Swab     Status: None   Collection Time: 02/02/20  1:22 PM   Specimen: Nasopharyngeal Swab  Result Value Ref Range Status   SARS Coronavirus 2 by RT PCR NEGATIVE NEGATIVE Final    Comment: (NOTE) SARS-CoV-2 target nucleic acids are NOT DETECTED.  The SARS-CoV-2 RNA is generally detectable in upper respiratoy specimens during the acute phase of infection. The lowest concentration of SARS-CoV-2 viral copies this assay can detect is 131 copies/mL. A negative result does not preclude SARS-Cov-2 infection and should not be used as  the sole basis for treatment or other patient management decisions. A negative result may occur with  improper specimen collection/handling, submission of specimen other than nasopharyngeal swab, presence of viral mutation(s) within the areas targeted by this assay, and inadequate number of viral copies (<131 copies/mL). A negative result must be combined with clinical observations, patient history, and epidemiological information. The expected result is Negative.  Fact Sheet for Patients:  PinkCheek.be  Fact Sheet for Healthcare Providers:  GravelBags.it  This test is no t yet approved or cleared by the [place] FDA and  has been authorized for detection and/or diagnosis of SARS-CoV-2 by FDA under an Emergency Use Authorization (EUA). This EUA will remain  in effect (meaning this test can be used) for the duration of the COVID-19 declaration under Section 564(b)(1) of the Act, 21 U.S.C. section 360bbb-3(b)(1), unless the authorization is terminated or revoked sooner.     Influenza A by PCR NEGATIVE NEGATIVE Final   Influenza B by PCR NEGATIVE NEGATIVE Final    Comment: (NOTE) The Xpert Xpress SARS-CoV-2/FLU/RSV assay is intended as an aid in  the diagnosis of influenza from Nasopharyngeal swab specimens and  should not be used as a sole basis for treatment. Nasal washings and  aspirates are unacceptable for Xpert Xpress SARS-CoV-2/FLU/RSV  testing.  Fact Sheet for Patients: PinkCheek.be  Fact Sheet for Healthcare Providers: GravelBags.it  This test is not yet approved or cleared by the [place] FDA and  has been authorized for detection and/or diagnosis of SARS-CoV-2 by  FDA under an Emergency Use Authorization (EUA). This EUA will remain  in effect (meaning this test can be used) for the duration of the  Covid-19 declaration under Section 564(b)(1)  of the Act, 21  U.S.C. section 360bbb-3(b)(1), unless the authorization is  terminated or revoked. Performed at [facility], [address] [place], [place] [address]      Labs: BNP (last 3 results) No results for input(s): BNP in the last 8760 hours. Basic Metabolic Panel: Recent Labs  Lab 02/02/20 0925 02/03/20 0541  NA 137 133*  K 4.2 4.3  CL 100 99  CO2 27 24  GLUCOSE 177* 248*  BUN 16 18  CREATININE 0.81 0.78  CALCIUM 9.6 9.2   Liver Function Tests: Recent Labs  Lab 02/02/20 0925  AST 29  ALT 31  ALKPHOS 43  BILITOT 0.9  PROT 7.7  ALBUMIN 4.5   No results for input(s): LIPASE, AMYLASE in the last 168 hours. No results for input(s): AMMONIA in the last 168 hours. CBC: Recent Labs  Lab 02/02/20 0925 02/03/20 0541  WBC 7.7 7.9  NEUTROABS 3.7  --   HGB 13.0 12.6  HCT 40.9 37.8  MCV 83.0 79.4*  PLT 170 169   Cardiac Enzymes: No results for input(s): CKTOTAL, CKMB, CKMBINDEX, TROPONINI in the last 168 hours. BNP: Invalid input(s): POCBNP CBG: Recent Labs  Lab 02/04/20 2030 02/05/20 0007 02/05/20 0415 02/05/20 0807 02/05/20 1136  GLUCAP 218* 233* 204* 167* 135*   D-Dimer No results for input(s): DDIMER in the last 72 hours. Hgb A1c No results for input(s): HGBA1C in the last 72 hours. Lipid Profile No results for input(s): CHOL, HDL, LDLCALC, TRIG, CHOLHDL, LDLDIRECT in the last 72 hours. Thyroid function studies No results for input(s): TSH, T4TOTAL, T3FREE, THYROIDAB in the last 72 hours.  Invalid input(s): FREET3 Anemia work up No results for input(s): VITAMINB12, FOLATE, FERRITIN, TIBC, IRON, RETICCTPCT in the last 72 hours. Urinalysis    Component Value Date/Time   COLORURINE YELLOW 01/18/2020 1042   APPEARANCEUR CLEAR 01/18/2020 1042   APPEARANCEUR Clear 12/13/2019 1551   LABSPEC 1.020 01/18/2020 1042   PHURINE 5.5 01/18/2020 1042   GLUCOSEU NEGATIVE 01/18/2020 1042   HGBUR NEGATIVE 01/18/2020 1042   BILIRUBINUR  NEGATIVE 01/18/2020 1042   BILIRUBINUR Negative 12/13/2019 [place] 01/18/2020 1042   PROTEINUR Negative 12/13/2019 1551   PROTEINUR NEGATIVE 10/15/2015 1444   UROBILINOGEN 0.2 01/18/2020 1042   NITRITE NEGATIVE 01/18/2020 1042   LEUKOCYTESUR NEGATIVE 01/18/2020 1042   Sepsis Labs Invalid input(s): PROCALCITONIN,  WBC,  LACTICIDVEN Microbiology Recent Results (from the past 240 hour(s))  Respiratory Panel by RT PCR (Flu A&B, Covid) - Nasopharyngeal Swab     Status: None   Collection Time: 02/02/20  1:22 PM   Specimen: Nasopharyngeal Swab  Result Value Ref Range Status   SARS Coronavirus 2 by RT PCR NEGATIVE NEGATIVE Final    Comment: (NOTE) SARS-CoV-2 target nucleic acids are NOT DETECTED.  The SARS-CoV-2 RNA is generally detectable in upper respiratoy specimens during the acute phase of infection. The lowest concentration of SARS-CoV-2 viral copies this assay can detect is 131 copies/mL. A negative result does not preclude SARS-Cov-2 infection and should  not be used as the sole basis for treatment or other patient management decisions. A negative result may occur with  improper specimen collection/handling, submission of specimen other than nasopharyngeal swab, presence of viral mutation(s) within the areas targeted by this assay, and inadequate number of viral copies (<131 copies/mL). A negative result must be combined with clinical observations, patient history, and epidemiological information. The expected result is Negative.  Fact Sheet for Patients:  PinkCheek.be  Fact Sheet for Healthcare Providers:  GravelBags.it  This test is no t yet approved or cleared by the [place] FDA and  has been authorized for detection and/or diagnosis of SARS-CoV-2 by FDA under an Emergency Use Authorization (EUA). This EUA will remain  in effect (meaning this test can be used) for the duration of  the COVID-19 declaration under Section 564(b)(1) of the Act, 21 U.S.C. section 360bbb-3(b)(1), unless the authorization is terminated or revoked sooner.     Influenza A by PCR NEGATIVE NEGATIVE Final   Influenza B by PCR NEGATIVE NEGATIVE Final    Comment: (NOTE) The Xpert Xpress SARS-CoV-2/FLU/RSV assay is intended as an aid in  the diagnosis of influenza from Nasopharyngeal swab specimens and  should not be used as a sole basis for treatment. Nasal washings and  aspirates are unacceptable for Xpert Xpress SARS-CoV-2/FLU/RSV  testing.  Fact Sheet for Patients: PinkCheek.be  Fact Sheet for Healthcare Providers: GravelBags.it  This test is not yet approved or cleared by the [place] FDA and  has been authorized for detection and/or diagnosis of SARS-CoV-2 by  FDA under an Emergency Use Authorization (EUA). This EUA will remain  in effect (meaning this test can be used) for the duration of the  Covid-19 declaration under Section 564(b)(1) of the Act, 21  U.S.C. section 360bbb-3(b)(1), unless the authorization is  terminated or revoked. Performed at [facility], [address] [place], [place] [address]     Time coordinating discharge: Over 30 minutes  SIGNED:  Lorella Nimrod, MD  [facility] 02/05/2020, 4:18 PM  If 7PM-7AM, please contact night-coverage [email or web address]  This record has been created using Systems analyst. Errors have been sought and corrected,but may not always be located. Such creation errors do not reflect on the standard of care.

## 2020-02-05 NOTE — Progress Notes (Signed)
Pulmonary Medicine          Date: 02/05/2020,   MRN# [id number] Kimberly Huff May 22, 1945     AdmissionWeight: 86.2 kg                 CurrentWeight: 85.9 kg  Referring physician : Dr Meribeth Mattes    CHIEF COMPLAINT:   Lung mass   HISTORY OF PRESENT ILLNESS   As per admission h/p Kimberly Huff is a 74 y.o. female with medical history significant for diabetes mellitus, coronary artery disease, dyslipidemia and GERD who was brought into the emergency room by his sister for evaluation of a transient 5-minute episode of slurred speech and left facial weakness.  Patient states that she tried to drink water but was unable to on her left side and spilled the water.  Symptoms occurred at about 8:19 AM.  She also describes twitching involving the left side of her face lasting about 5 minutes.  Patient states that she had a similar episode the day prior but this resolved.  Patient was concerned that she may be having a stroke and so called her sister who brought her to the emergency room for evaluation.  She arrived to the ER and a code stroke was called. She denies having any chest pain, no shortness of breath no nausea, no vomiting, no dizziness, no lightheadedness, no abdominal pain or any changes in her bowel habits.  She denies having any urinary symptoms. Patient had a left sided nephrectomy done earlier this year for segmental glomerulosclerosis and chronic pyelonephritis.  Pathology was negative for malignancy.  Patient is also status post biopsy of a bladder lesion and pathology was negative for malignancy as well.  Labs show sodium 141, potassium 4.7, chloride 105, bicarb 29, BUN 21, creatinine 0.75, phosphatase 43, AST 12, ALT 15, total protein 6.6, white count 7.7, hemoglobin 13, hematocrit 40.9, MCV 83, RDW 14.3, platelet count 170.  CT scan of the head without contrast shows two areas of edema in the right cerebral hemisphere. Although both are located in the MCA distribution, these  have the appearance of vasogenic edema - more or worrisome for mass lesion than infarct. Recommend brain MRI with contrast. MRI of the brain with and without contrast showed two enhancing mass lesions are present in the right cerebral hemisphere with surrounding edema and central necrosis compatible with metastatic disease. No midline shift. Mild chronic microvascular ischemic change in the white matter. No acute infarct.  CT angiogram showed no intracranial stenosis or occlusion. 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  Twelve-lead EKG reviewed by me shows sinus rhythm with nonspecific T wave changes.  Patient is a 74 year old Caucasian female who presented to the emergency room as a code stroke.  She developed left-sided facial weakness and slurred speech which has completely resolved.  Imaging showed areas of edema in the right cerebral hemisphere located in the MCA distribution suggestive of vasogenic edema and an MRI was recommended.  MRI of the brain done with IV contrast shows 2 enhancing mass lesions in the right cerebral hemisphere with surrounding edema and central necrosis compatible with metastatic disease.  Pulmonary consultation was placed for additional evaluation and guidance. Patient has lung mass which requires biopsy for tissue diagnosis to allow plan for therapy. I have met with patient at bedside to discuss findings and plan of care.  Separately I had spoken with [name] (daughter of patient) to review findings and discuss short and long term plan.  02/05/20- patient has appt Thursday 330 pm with pulmonary at [facility]. We are planning to get bronchoscopy done when possible.  I spoke with daughter and patient at bedside regardign scheduling issues and asprin. Spoke with Dr B regarding possible IR CT guided biopsy to expedite faster    PAST MEDICAL HISTORY   Past Medical History:  Diagnosis Date  . Allergy   . Atrophic kidney    left  . Blood clot in vein 45 yrs ago   inabdoment  after surgery  . Blood transfusion without reported diagnosis   . Cataract   . Complication of anesthesia   . Coronary artery disease   . Coronary atherosclerosis   . Diabetes mellitus   . Diverticulosis 2005   Dr Jamal Collin  . Dyspnea    with exertion  . External hemorrhoids without mention of complication 3300  . GERD (gastroesophageal reflux disease)   . H/O cardiac catheterization 2005  . Heart murmur 2012   found by Dr. Jamal Collin  . Hyperlipidemia   . Hypertension   . Kidney stone   . Lesion of bladder   . Myocardial infarction ([place]) 2000  . Neuromuscular disorder ([place])    "achy muscles"  . Obesity   . Osteoarthritis   . PONV (postoperative nausea and vomiting)   . Recurrent UTI   . Spinal stenosis of cervical region   . Steatohepatitis 05/30/10   Brunt Stage 3, non alcoholic cirrhosis of the liver  . Type II or unspecified type diabetes mellitus without mention of complication, uncontrolled    Patient does takes Metformin and recommended to stop for 48 hours.  Marland Kitchen Ulcer   . Unspecified essential hypertension 2014     SURGICAL HISTORY   Past Surgical History:  Procedure Laterality Date  . ABDOMINAL HYSTERECTOMY  1982   complete  . BREAST CYST ASPIRATION Right 1990's   benign  . BREAST MASS EXCISION     benign  . CARDIAC CATHETERIZATION  2000   cardiac stent  . cardiac stents  2000  . CATARACT EXTRACTION     both eyes  . COLONOSCOPY  2004, 2015   Dr. Jamal Collin, Dr. Olevia Perches  . CYSTOSCOPY W/ RETROGRADES Left 11/12/2015   Procedure: CYSTOSCOPY WITH RETROGRADE PYELOGRAM;  Surgeon: Cleon Gustin, MD;  Location: [facility] ORS;  Service: Urology;  Laterality: Left;  . CYSTOSCOPY W/ URETERAL STENT PLACEMENT Left 11/12/2015   Procedure: CYSTOSCOPY WITH STENT REPLACEMENT;  Surgeon: Cleon Gustin, MD;  Location: [facility] ORS;  Service: Urology;  Laterality: Left;  . CYSTOSCOPY WITH BIOPSY N/A 09/26/2019   Procedure: CYSTOSCOPY WITH BIOPSY AND FULGURATION;  Surgeon: Cleon Gustin, MD;  Location[facility];  Service: Urology;  Laterality: N/A;  . CYSTOSCOPY WITH URETEROSCOPY AND STENT PLACEMENT Left 10/15/2015   Procedure: CYSTOSCOPY WITH URETEROSCOPY AND STENT PLACEMENT;  Surgeon: Cleon Gustin, MD;  Location: [facility] ORS;  Service: Urology;  Laterality: Left;  . EYE SURGERY Bilateral 2012   cataract  . FOOT SURGERY  2008  . PTCA  2000   stent x 1  . ROBOTIC ASSITED PARTIAL NEPHRECTOMY Left 06/02/2019   Procedure: XI ROBOTIC ASSITED NEPHRECTOMY;  Surgeon: Cleon Gustin, MD;  Location: [facility];  Service: Urology;  Laterality: Left;  . SALPINGOOPHORECTOMY    . TONSILLECTOMY    . UPPER GI ENDOSCOPY    . URETEROSCOPY WITH HOLMIUM LASER LITHOTRIPSY Left 11/12/2015   Procedure: URETEROSCOPY WITH HOLMIUM LASER LITHOTRIPSY;  Surgeon: Cleon Gustin, MD;  Location: [facility]  ORS;  Service: Urology;  Laterality: Left;  . URETEROTOMY  1960/1969   x 2 during childhood  . VAGINAL HYSTERECTOMY       FAMILY HISTORY   Family History  Problem Relation Age of Onset  . Lung cancer Father   . Heart disease Father   . Diverticulosis Mother   . Diabetes Mother   . Stroke Mother 62  . Autoimmune disease Daughter   . Thyroid disease Daughter   . Thyroid disease Sister   . Stomach cancer Maternal Aunt   . Breast cancer Maternal Aunt   . Colon cancer Neg Hx   . Esophageal cancer Neg Hx   . Rectal cancer Neg Hx      SOCIAL HISTORY   Social History   Tobacco Use  . Smoking status: Former Smoker    Packs/day: 0.50    Years: 38.00    Pack years: 19.00    Types: Cigarettes    Quit date: 09/15/1998    Years since quitting: 21.4  . Smokeless tobacco: Never Used  Vaping Use  . Vaping Use: Never used  Substance Use Topics  . Alcohol use: Not Currently  . Drug use: No     MEDICATIONS    Home Medication:    Current Medication:  Current Facility-Administered Medications:  .  dexamethasone (DECADRON) tablet 4 mg, 4 mg, Oral, Q8H,  Brahmanday, Govinda R, MD, 4 mg at 02/05/20 1115 .  furosemide (LASIX) tablet 40 mg, 40 mg, Oral, Daily, Agbata, Tochukwu, MD, 40 mg at 02/05/20 1210 .  insulin aspart (novoLOG) injection 0-15 Units, 0-15 Units, Subcutaneous, TID WC, Agbata, Tochukwu, MD, 2 Units at 02/05/20 1209 .  insulin glargine (LANTUS) injection 12 Units, 12 Units, Subcutaneous, BID, Lorella Nimrod, MD, 12 Units at 02/05/20 1115 .  ipratropium-albuterol (DUONEB) 0.5-2.5 (3) MG/3ML nebulizer solution 3 mL, 3 mL, Nebulization, Q4H PRN, Lorella Nimrod, MD, 3 mL at 02/05/20 1210 .  levETIRAcetam (KEPPRA) tablet 500 mg, 500 mg, Oral, BID, Greta Doom, MD, 500 mg at 02/05/20 0831 .  losartan (COZAAR) tablet 50 mg, 50 mg, Oral, Daily, Agbata, Tochukwu, MD, 50 mg at 02/05/20 0831 .  ondansetron (ZOFRAN) tablet 4 mg, 4 mg, Oral, Q6H PRN **OR** ondansetron (ZOFRAN) injection 4 mg, 4 mg, Intravenous, Q6H PRN, Agbata, Tochukwu, MD .  pantoprazole (PROTONIX) EC tablet 40 mg, 40 mg, Oral, Daily, Agbata, Tochukwu, MD, 40 mg at 02/05/20 0831 .  rosuvastatin (CRESTOR) tablet 10 mg, 10 mg, Oral, Daily, Agbata, Tochukwu, MD, 10 mg at 02/04/20 2146 .  senna-docusate (Senokot-S) tablet 2 tablet, 2 tablet, Oral, Daily PRN, Agbata, Tochukwu, MD .  sodium chloride flush (NS) 0.9 % injection 3 mL, 3 mL, Intravenous, Once, Merlyn Lot, MD .  traMADol Veatrice Bourbon) tablet 50 mg, 50 mg, Oral, Daily PRN, Agbata, Tochukwu, MD .  Derrill Memo ON 02/08/2020] Vitamin D (Ergocalciferol) (DRISDOL) capsule 50,000 Units, 50,000 Units, Oral, Q7 days, Agbata, Tochukwu, MD    ALLERGIES   Patient has no known allergies.     REVIEW OF SYSTEMS    Review of Systems:  Gen:  Denies  fever, sweats, chills weigh loss  HEENT: Denies blurred vision, double vision, ear pain, eye pain, hearing loss, nose bleeds, sore throat Cardiac:  No dizziness, chest pain or heaviness, chest tightness,edema Resp:   Denies cough or sputum porduction, shortness of  breath,wheezing, hemoptysis,  Gi: Denies swallowing difficulty, stomach pain, nausea or vomiting, diarrhea, constipation, bowel incontinence Gu:  Denies bladder incontinence, burning urine Ext:   Denies  Joint pain, stiffness or swelling Skin: Denies  skin rash, easy bruising or bleeding or hives Endoc:  Denies polyuria, polydipsia , polyphagia or weight change Psych:   Denies depression, insomnia or hallucinations   Other:  All other systems negative   VS: BP 135/67 (BP Location: Right Arm)   Pulse (!) 54   Temp 98.3 F (36.8 C) (Oral)   Resp 17   Ht 5' 3"  (1.6 m)   Wt 85.9 kg   SpO2 96%   BMI 33.56 kg/m      PHYSICAL EXAM    GENERAL:NAD, no fevers, chills, no weakness no fatigue HEAD: Normocephalic, atraumatic.  EYES: Pupils equal, round, reactive to light. Extraocular muscles intact. No scleral icterus.  MOUTH: Moist mucosal membrane. Dentition intact. No abscess noted.  EAR, NOSE, THROAT: Clear without exudates. No external lesions.  NECK: Supple. No thyromegaly. No nodules. No JVD.  PULMONARY: Diffuse coarse rhonchi right sided +wheezes CARDIOVASCULAR: S1 and S2. Regular rate and rhythm. No murmurs, rubs, or gallops. No edema. Pedal pulses 2+ bilaterally.  GASTROINTESTINAL: Soft, nontender, nondistended. No masses. Positive bowel sounds. No hepatosplenomegaly.  MUSCULOSKELETAL: No swelling, clubbing, or edema. Range of motion full in all extremities.  NEUROLOGIC: Cranial nerves II through XII are intact. No gross focal neurological deficits. Sensation intact. Reflexes intact.  SKIN: No ulceration, lesions, rashes, or cyanosis. Skin warm and dry. Turgor intact.  PSYCHIATRIC: Mood, affect within normal limits. The patient is awake, alert and oriented x 3. Insight, judgment intact.       IMAGING    MR ANGIO HEAD WO CONTRAST  Result Date: 02/02/2020 CLINICAL DATA:  Seizure. EXAM: MRA HEAD WITHOUT CONTRAST TECHNIQUE: Angiographic images of the Circle of Willis were  obtained using MRA technique without intravenous contrast. COMPARISON:  MRI head 02/02/2020 FINDINGS: Left vertebral artery dominant. Both vertebral arteries patent to the basilar without stenosis. Left PICA patent. Probable small right PICA. Small right AICA patent. Basilar widely patent. Superior cerebellar and posterior cerebral arteries patent without stenosis. Lobulated aneurysm right cavernous carotid in the Peri ophthalmic region. This measures approximately 6 x 7 mm. Cavernous carotid is patent bilaterally without stenosis Anterior and middle cerebral arteries patent bilaterally without stenosis or additional aneurysm. 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 Electronically Signed   By: Franchot Gallo M.D.   On: 02/02/2020 11:28   MR ANGIO NECK W WO CONTRAST  Result Date: 02/02/2020 CLINICAL DATA:  Seizure. EXAM: MRA NECK WITHOUT AND WITH CONTRAST TECHNIQUE: Multiplanar and multiecho pulse sequences of the neck were obtained without and with intravenous contrast. Angiographic images of the neck were obtained using MRA technique without and with intravenous contrast. CONTRAST:  24m GADAVIST GADOBUTROL 1 MMOL/ML IV SOLN COMPARISON:  None. FINDINGS: Normal aortic arch. Proximal great vessels widely patent without stenosis. Carotid bifurcation is normal bilaterally. No significant stenosis or irregularity Antegrade flow in both vertebral arteries. Left vertebral artery dominant. No vertebral artery stenosis. Aneurysm right cavernous carotid. IMPRESSION: Negative MRA neck. Aneurysm right cavernous carotid. Electronically Signed   By: CFranchot GalloM.D.   On: 02/02/2020 11:25   MR BRAIN W WO CONTRAST  Result Date: 02/02/2020 CLINICAL DATA:  Seizure.  Abnormal CT head. EXAM: MRI HEAD WITHOUT AND WITH CONTRAST TECHNIQUE: Multiplanar, multiecho pulse sequences of the brain and surrounding structures were obtained without and with intravenous contrast.  CONTRAST:  974mGADAVIST GADOBUTROL 1 MMOL/ML IV SOLN COMPARISON:  CT head 02/02/2020 FINDINGS: Brain: Peripheral enhancing mass  lesion in the right middle frontal lobe laterally with central necrosis and surrounding edema. The enhancing mass measures up to 15 mm in diameter. This is peripherally located. Second peripherally enhancing mass lesion in the right posterior temporal lobe measures 9 mm in diameter with surrounding vasogenic edema. No third lesion identified. Negative for acute infarct. Scattered small white matter hyperintensities compatible with mild chronic microvascular ischemia. Negative for hydrocephalus or hemorrhage. Vascular: Normal arterial flow voids Skull and upper cervical spine: No focal skeletal lesion. Sinuses/Orbits: Mild mucosal edema paranasal sinuses. Bilateral cataract extraction Other: None IMPRESSION: Two enhancing mass lesions are present in the right cerebral hemisphere with surrounding edema and central necrosis compatible with metastatic disease. No midline shift Mild chronic microvascular ischemic change in the white matter. No acute infarct. Electronically Signed   By: Franchot Gallo M.D.   On: 02/02/2020 11:22   CT CHEST ABDOMEN PELVIS W CONTRAST  Result Date: 02/02/2020 CLINICAL DATA:  74 year old female with abnormal brain MRI and CT today compatible with cerebral metastases. Unknown primary. History of left oophorectomy for nonfunctioning left kidney. EXAM: CT CHEST, ABDOMEN, AND PELVIS WITH CONTRAST TECHNIQUE: Multidetector CT imaging of the chest, abdomen and pelvis was performed following the standard protocol during bolus administration of intravenous contrast. CONTRAST:  159m OMNIPAQUE IOHEXOL 300 MG/ML  SOLN COMPARISON:  Brain MRI today. [facility] CT Abdomen and Pelvis 07/28/2019 and earlier. FINDINGS: CT CHEST FINDINGS Cardiovascular: Calcified aortic atherosclerosis. Calcified coronary artery atherosclerosis. No cardiomegaly or pericardial  effusion. Mediastinum/Nodes: Left upper lung mass inseparable from the mediastinum including the aortic arch (series 5, image 84). But mediastinal lymph nodes remains small, up to 6 mm short axis. Lungs/Pleura: Lobulated up to 8 cm mass of the medial left upper lung inseparable from the anterior lung apex and mediastinum. No associated left pleural effusion. There is contralateral right lower lobe small but spiculated nodule measuring about 11 mm (series 5, image 102). There are also two smaller sub solid nodules in the right upper lobe on series 4, image 48 and image 70. Superimposed scattered bilateral subpleural pulmonary scarring. Musculoskeletal: Stable mild T4 superior endplate compression. No acute or suspicious osseous lesion identified in the chest. CT ABDOMEN PELVIS FINDINGS Hepatobiliary: Hepatic steatosis and mildly nodular liver contour (series 2, image 55) suspicious for cirrhosis. No discrete liver lesion. Negative gallbladder. Pancreas: Partially atrophied but otherwise negative. Spleen: Negative.  Incidental splenule (normal variant). Adrenals/Urinary Tract: Normal adrenal glands. Surgically absent left kidney as before. Right renal enhancement and contrast excretion is preserved. An anterior midpole exophytic low-density renal mass measures up to 3.8 cm diameter now, versus 2.2 cm in 2017. This has low to intermediate density by CT. On 2018 renal ultrasound this appeared simple and benign. Negative right ureter. Diminutive and unremarkable urinary bladder. Stomach/Bowel: Diverticulosis of the sigmoid colon. Oral contrast has reached the splenic flexure without obstruction. Decompressed ascending and proximal transverse colon. Negative appendix. Decompressed terminal ileum. No dilated small bowel. Unremarkable stomach. Chronic duodenal diverticulum near the midline appears inconsequential. No free air. No free fluid. Vascular/Lymphatic: Extensive Aortoiliac calcified atherosclerosis. Major arterial  structures remain patent. Portal venous system is patent. No lymphadenopathy. Reproductive: Surgically absent uterus. Ovaries are within normal limits on series 2, image 105). Other: No pelvic free fluid. Small umbilical hernia with trace fluid is stable. Musculoskeletal: Advanced lumbar spine degeneration including grade 1 spondylolisthesis in the lower lumbar spine with bulky facet hypertrophy. No acute or suspicious osseous lesion identified. There is a chronic oval mixed density soft  tissue mass along the anterior lower sacrum which has only mildly increased in size since 2017, measuring up to 4 cm diameter now on series 2, image 102. This is stable since March. IMPRESSION: 1. Positive for an 8 cm left upper lung mass inseparable from the mediastinum compatible with Bronchogenic Carcinoma. 2. Mediastinal lymph nodes remain normal by size criteria. There are three indeterminate small but spiculated right lung nodules, the largest measuring 11 mm in the lower lobe. 3. No metastatic disease identified in the abdomen or pelvis. 4. Suspicion of Cirrhosis with hepatic steatosis. 5. Chronic presacral soft tissue mass favored to be benign myelolipoma. Chronic right renal cyst which had a benign ultrasound appearance in 2018. Sigmoid diverticulosis. Duodenal diverticulum. 6. Calcified coronary artery and Aortic atherosclerosis (ICD10-I70.0). Electronically Signed   By: Genevie Ann M.D.   On: 02/02/2020 20:54   CT HEAD CODE STROKE WO CONTRAST  Result Date: 02/02/2020 CLINICAL DATA:  Code stroke. Slurred speech and left-sided numbness. EXAM: CT HEAD WITHOUT CONTRAST TECHNIQUE: Contiguous axial images were obtained from the base of the skull through the vertex without intravenous contrast. COMPARISON:  None. FINDINGS: Brain: 2 areas moderate edema in the right cerebral hemisphere, lateral frontal and temporoparietal. Although both in the MCA distribution these show preserved cortex, more suggestive of vasogenic than  cytotoxic edema. No acute hemorrhage, hydrocephalus, or shift. Vascular: No hyperdense vessel. Skull: Normal. Negative for fracture or focal lesion. Sinuses/Orbits: Bilateral cataract resection Other: Critical Value/emergent results were called by telephone at the time of interpretation on 02/02/2020 at 9:16 am to provider Merlyn Lot , who verbally acknowledged these results. ASPECTS [facility] Stroke Program Early CT Score) Not scored given the above IMPRESSION: Two areas of edema in the right cerebral hemisphere. Although both are located in the MCA distribution, these have the appearance of vasogenic edema - more or worrisome for mass lesion than infarct. Recommend brain MRI with contrast. Electronically Signed   By: Monte Fantasia M.D.   On: 02/02/2020 09:19      ASSESSMENT/PLAN   Left upper lobe lung mass    - most likely malignancy with additional findings of brain lesions suspicious for mets    - patient would need tissue diagnosis for therapy     -have discussed with patient she is agreeable to plan   - have reached out to daughter to discuss details - she understands and all questions were answered   - plan for bronchoscopic evaluation and biopsy of mass   -Complications include: bleeding, cough, infection, pneumothorax, pneumomediastinum, possible need for chest tube and intubation with mechanical ventilation and rarely death.    - Patient relates understanding above and all additional questions have been answered.     -Discussed with pulmonary RN staff to make stat appt for presurgical pulmonary evaluation   -[facility] pulm att Thursday 330 pm    Thank you for allowing me to participate in the care of this patient.   Patient/Family are satisfied with care plan and all questions have been answered.  This document was prepared using Dragon voice recognition software and may include unintentional dictation errors.     Ottie Glazier, M.D.  Division of [place]

## 2020-02-05 NOTE — Care Management Important Message (Signed)
Important Message  Patient Details  Name: Kimberly Huff MRN: [id number] Date of Birth: [birth year]   Medicare Important Message Given:  Yes     Dannette Barbara 02/05/2020, 1:04 PM

## 2020-02-05 NOTE — Discharge Instructions (Signed)
Metastatic Brain Tumor, Adult  A metastatic brain tumor is a collection of cancer cells that develop somewhere else in the body and then spread (metastasize) to the brain and grow there. In most cases, the cancer will also have spread to other areas of the body. In most cases, the first (primary) cancer was found and treated, but treatment did not prevent the cancer cells from spreading. Signs and symptoms of metastatic brain tumor may occur months or even years after treatment for the primary cancer. In very rare cases, these symptoms may be the first sign of the primary cancer. What are the causes? This condition is caused by cancer. Among men, the most common cause of metastatic brain tumor is lung cancer. Among women, the most common cause is breast cancer. Other kinds of cancer can also cause this condition, including:  Cancer of the skin (melanoma).  Cancer of the large intestine.  Cancer of the kidney.  Cancer of the prostate. What increases the risk? You are more likely to develop this condition if you have or have had cancer. What are the signs or symptoms? The symptoms of a metastatic brain tumor are related to the location of the tumor within the brain and may include:  Headache. This is often the first symptom.  Stiff neck.  Nausea and vomiting.  Fatigue.  Loss of appetite.  Avoidance of light (photophobia).  Loss of consciousness (coma).  Loss of hearing.  Difficulty swallowing.  Changes in how you think or act.  Tingling, numbness, or weakness in part of your body.  Jerky movements you cannot control (seizures).  Problems with balance and walking.  Clumsiness.  Changes in speech or vision, such as blurred vision. How is this diagnosed? This condition may be diagnosed based on:  Your medical history.  Your symptoms.  A physical exam.  A test of your nerves and reflexes (neurological exam).  Blood tests to check for substances that indicate the  presence of cancer (tumor markers).  Imaging studies of your brain, such as: ? MRI. ? CT scan. ? PET scan.  A biopsy. This means that a small piece of brain tissue is removed and examined under a microscope. You may need to work with a health care provider who specializes in cancer treatment (oncologist). How is this treated? Treatment depends on the type of cancer you have and your overall health. It also depends on the size and location of your tumor(s). The most common treatments include:  Medicines to help control pain, nausea, and seizures.  Medicines that kill cancer cells (chemotherapy).  X-ray treatment to kill cancer cells (radiation).  A type of radiation therapy that is targeted to exact locations in the brain (stereotactic radiotherapy).  Surgery to remove brain tumors.  Occupational and physical therapy. A combination of chemotherapy and radiation may be used. Follow these instructions at home: Eating and drinking  Be sure to get enough calories and protein in your diet.  If you have a poor appetite or nausea, eat small meals often.  If directed by your health care provider, supplement your diet with protein shakes or milk shakes. Activity  Exercise regularly as told by your health care provider. Take two 10-minute walks every day, if you are able.  If recommended, use assistive devices such as a cane or walker to help you with walking. Ask to meet with a physical therapist to help you with instructions and equipment you might need.  Return to your normal activities as told by  your health care provider. Ask your health care provider what activities are safe for you.  Ask your health care provider if it is safe for you to drive. ? Do not drive if you cannot see or think clearly. ? Do not drive or use heavy machinery while taking prescription pain medicine. ? Do not drive if you have recently had a seizure. Check with your health care provider about laws that  regulate when to drive after a seizure. General instructions  Take over-the-counter and prescription medicines only as told by your health care provider.  Do not use any products that contain nicotine or tobacco, such as cigarettes and e-cigarettes. If you need help quitting, ask your health care provider.  Know what side effects of treatment to watch for. Keep records of all your treatments.  Install grab bars and railings in your home to prevent falls.  It is common to have anxiety and depression when you are coping with cancer. To help relieve anxiety or depression: ? Talk to friends and loved ones about your feelings. ? Have a good support system at home.  Keep all follow-up visits as told by your health care provider. This is important. Contact a health care provider if:  You have chills or a fever.  Your medicine is not controlling your symptoms.  Your symptoms get worse or you develop new symptoms.  You have side effects from treatment.  You are unsteady or have fallen at home.  You are having trouble caring for yourself or being cared for at home.  You feel confused, anxious, or depressed. Get help right away if:  You have a seizure.  You cannot stop vomiting.  You cannot keep down any foods or fluids.  You have sudden changes in speech or vision.  You can no longer take care of yourself at home.  You have any symptoms that are severe or do not get better with treatment. Summary  A metastatic brain tumor is a collection of cancer cells that develop somewhere else in the body and then spread to the brain and grow there. In most cases, the cancer will have also spread to other areas of the body.  Treatment depends on the type of cancer you have and your overall health. It also depends on the size and location of your tumor(s).  Contact your health care provider if you have a seizure or have any symptoms that are severe or do not get better with treatment. This  information is not intended to replace advice given to you by your health care provider. Make sure you discuss any questions you have with your health care provider. Document Revised: 04/02/2017 Document Reviewed: 06/10/2016 Elsevier Patient Education  [place].

## 2020-02-05 NOTE — Telephone Encounter (Signed)
M- Please schedule this pt on 10/08 at 10:30- MD; labs-cbc/cmp.  Cancel the current appt on 10/08 10:30 appt.   GB

## 2020-02-05 NOTE — Progress Notes (Signed)
SATURATION QUALIFICATIONS: (This note is used to comply with regulatory documentation for home oxygen)  Patient Saturations on Room Air at Rest = 96%  Patient Saturations on Room Air while Ambulating = 95%  Patient Saturations on N/A Liters of oxygen while Ambulating = N/A  Please briefly explain why patient needs home oxygen:

## 2020-02-05 NOTE — Consult Note (Signed)
NEW PATIENT EVALUATION  Name: Kimberly Huff  MRN: [id number]  Date:   02/02/2020     DOB: [birth year]   This 74 y.o. female patient presents to the clinic for initial evaluation of brain metastasis from probable locally advanced non-small cell lung cancer.  REFERRING PHYSICIAN: No ref. provider found  CHIEF COMPLAINT:  Chief Complaint  Patient presents with  . Aphasia  . Weakness    DIAGNOSIS: The primary encounter diagnosis was Brain metastases ([place]). Diagnoses of Metastatic cancer to brain [facility]) and Brain metastasis ([place]) were also pertinent to this visit.   PREVIOUS INVESTIGATIONS:  Brain MRI and CT scans reviewed Labs reviewed Clinical notes reviewed  HPI: Patient is a 74 year old female prior smoker admitted to the hospital with what was believed to be a CVA although in retrospect was a seizure.  She had of onset of garbled speech and twitching of her mouth.  MRI of the brain showed 2 lesions one 15 mm in the right frontal lobe and one 9 mm in the posterior temporal lobe associated with vasogenic edema consistent with metastatic disease.  She also on CT scan of the chest showing 8 cm left upper lobe mass inseparable from mediastinal compatible with bronchogenic carcinoma.  Patient has been having some trouble with her breathing.  She is having no focal neurologic deficits at this point time.  Specifically denies any weakness in her upper and lower extremities.  I have seen her in her hospital room for discussion of possible palliative treatment.  She is not yet had a schedule biopsy although I am anticipating she will probably have bronchoscopy to determine histology of her lung tumor.  PLANNED TREATMENT REGIMEN: SRS  PAST MEDICAL HISTORY:  has a past medical history of Allergy, Atrophic kidney, Blood clot in vein (45 yrs ago), Blood transfusion without reported diagnosis, Cataract, Complication of anesthesia, Coronary artery disease, Coronary atherosclerosis, Diabetes mellitus,  Diverticulosis (2005), Dyspnea, External hemorrhoids without mention of complication (3710), GERD (gastroesophageal reflux disease), H/O cardiac catheterization (2005), Heart murmur (2012), Hyperlipidemia, Hypertension, Kidney stone, Lesion of bladder, Myocardial infarction ([place]) (2000), Neuromuscular disorder ([place]), Obesity, Osteoarthritis, PONV (postoperative nausea and vomiting), Recurrent UTI, Spinal stenosis of cervical region, Steatohepatitis (05/30/10), Type II or unspecified type diabetes mellitus without mention of complication, uncontrolled, Ulcer, and Unspecified essential hypertension (2014).    PAST SURGICAL HISTORY:  Past Surgical History:  Procedure Laterality Date  . ABDOMINAL HYSTERECTOMY  1982   complete  . BREAST CYST ASPIRATION Right 1990's   benign  . BREAST MASS EXCISION     benign  . CARDIAC CATHETERIZATION  2000   cardiac stent  . cardiac stents  2000  . CATARACT EXTRACTION     both eyes  . COLONOSCOPY  2004, 2015   Dr. Jamal Collin, Dr. Olevia Perches  . CYSTOSCOPY W/ RETROGRADES Left 11/12/2015   Procedure: CYSTOSCOPY WITH RETROGRADE PYELOGRAM;  Surgeon: Cleon Gustin, MD;  Location: [facility] ORS;  Service: Urology;  Laterality: Left;  . CYSTOSCOPY W/ URETERAL STENT PLACEMENT Left 11/12/2015   Procedure: CYSTOSCOPY WITH STENT REPLACEMENT;  Surgeon: Cleon Gustin, MD;  Location: [facility] ORS;  Service: Urology;  Laterality: Left;  . CYSTOSCOPY WITH BIOPSY N/A 09/26/2019   Procedure: CYSTOSCOPY WITH BIOPSY AND FULGURATION;  Surgeon: Cleon Gustin, MD;  Location: [facility];  Service: Urology;  Laterality: N/A;  . CYSTOSCOPY WITH URETEROSCOPY AND STENT PLACEMENT Left 10/15/2015   Procedure: CYSTOSCOPY WITH URETEROSCOPY AND STENT PLACEMENT;  Surgeon: Cleon Gustin, MD;  Location: [facility]  ORS;  Service: Urology;  Laterality: Left;  . EYE SURGERY Bilateral 2012   cataract  . FOOT SURGERY  2008  . PTCA  2000   stent x 1  . ROBOTIC ASSITED PARTIAL NEPHRECTOMY  Left 06/02/2019   Procedure: XI ROBOTIC ASSITED NEPHRECTOMY;  Surgeon: Cleon Gustin, MD;  Location: [facility] ORS;  Service: Urology;  Laterality: Left;  . SALPINGOOPHORECTOMY    . TONSILLECTOMY    . UPPER GI ENDOSCOPY    . URETEROSCOPY WITH HOLMIUM LASER LITHOTRIPSY Left 11/12/2015   Procedure: URETEROSCOPY WITH HOLMIUM LASER LITHOTRIPSY;  Surgeon: Cleon Gustin, MD;  Location: [facility] ORS;  Service: Urology;  Laterality: Left;  . URETEROTOMY  1960/1969   x 2 during childhood  . VAGINAL HYSTERECTOMY      FAMILY HISTORY: family history includes Autoimmune disease in her daughter; Breast cancer in her maternal aunt; Diabetes in her mother; Diverticulosis in her mother; Heart disease in her father; Lung cancer in her father; Stomach cancer in her maternal aunt; Stroke (age of onset: 58) in her mother; Thyroid disease in her daughter and sister.  SOCIAL HISTORY:  reports that she quit smoking about 21 years ago. Her smoking use included cigarettes. She has a 19.00 pack-year smoking history. She has never used smokeless tobacco. She reports previous alcohol use. She reports that she does not use drugs.  ALLERGIES: Patient has no known allergies.  MEDICATIONS:  Current Facility-Administered Medications  Medication Dose Route Frequency Provider Last Rate Last Admin  . dexamethasone (DECADRON) tablet 4 mg  4 mg Oral Q8H Charlaine Dalton R, MD   4 mg at 02/05/20 1115  . furosemide (LASIX) tablet 40 mg  40 mg Oral Daily Agbata, Tochukwu, MD      . insulin aspart (novoLOG) injection 0-15 Units  0-15 Units Subcutaneous TID WC Agbata, Tochukwu, MD   3 Units at 02/05/20 0831  . insulin glargine (LANTUS) injection 12 Units  12 Units Subcutaneous BID Lorella Nimrod, MD   12 Units at 02/05/20 1115  . ipratropium-albuterol (DUONEB) 0.5-2.5 (3) MG/3ML nebulizer solution 3 mL  3 mL Nebulization Q4H PRN Lorella Nimrod, MD      . levETIRAcetam (KEPPRA) tablet 500 mg  500 mg Oral BID Greta Doom, MD    500 mg at 02/05/20 0831  . losartan (COZAAR) tablet 50 mg  50 mg Oral Daily Agbata, Tochukwu, MD   50 mg at 02/05/20 0831  . ondansetron (ZOFRAN) tablet 4 mg  4 mg Oral Q6H PRN Agbata, Tochukwu, MD       Or  . ondansetron (ZOFRAN) injection 4 mg  4 mg Intravenous Q6H PRN Agbata, Tochukwu, MD      . pantoprazole (PROTONIX) EC tablet 40 mg  40 mg Oral Daily Agbata, Tochukwu, MD   40 mg at 02/05/20 0831  . rosuvastatin (CRESTOR) tablet 10 mg  10 mg Oral Daily Agbata, Tochukwu, MD   10 mg at 02/04/20 2146  . senna-docusate (Senokot-S) tablet 2 tablet  2 tablet Oral Daily PRN Agbata, Tochukwu, MD      . sodium chloride flush (NS) 0.9 % injection 3 mL  3 mL Intravenous Once Merlyn Lot, MD      . traMADol Veatrice Bourbon) tablet 50 mg  50 mg Oral Daily PRN Agbata, Tochukwu, MD      . Derrill Memo ON 02/08/2020] Vitamin D (Ergocalciferol) (DRISDOL) capsule 50,000 Units  50,000 Units Oral Q7 days Agbata, Tochukwu, MD        ECOG PERFORMANCE STATUS:  2 -  Symptomatic, <50% confined to bed  REVIEW OF SYSTEMS: Patient denies any weight loss, fatigue, weakness, fever, chills or night sweats. Patient denies any loss of vision, blurred vision. Patient denies any ringing  of the ears or hearing loss. No irregular heartbeat. Patient denies heart murmur or history of fainting. Patient denies any chest pain or pain radiating to her upper extremities. Patient denies any shortness of breath, difficulty breathing at night, cough or hemoptysis. Patient denies any swelling in the lower legs. Patient denies any nausea vomiting, vomiting of blood, or coffee ground material in the vomitus. Patient denies any stomach pain. Patient states has had normal bowel movements no significant constipation or diarrhea. Patient denies any dysuria, hematuria or significant nocturia. Patient denies any problems walking, swelling in the joints or loss of balance. Patient denies any skin changes, loss of hair or loss of weight. Patient denies any  excessive worrying or anxiety or significant depression. Patient denies any problems with insomnia. Patient denies excessive thirst, polyuria, polydipsia. Patient denies any swollen glands, patient denies easy bruising or easy bleeding. Patient denies any recent infections, allergies or URI. Patient "s visual fields have not changed significantly in recent time.   PHYSICAL EXAM: BP 135/67 (BP Location: Right Arm)   Pulse (!) 54   Temp 98.3 F (36.8 C) (Oral)   Resp 17   Ht 5' 3"  (1.6 m)   Wt 189 lb 7.1 oz (85.9 kg)   SpO2 96%   BMI 33.56 kg/m  No focal neurologic deficits are appreciated crude visual fields are within normal range.  Well-developed well-nourished patient in NAD. HEENT reveals PERLA, EOMI, discs not visualized.  Oral cavity is clear. No oral mucosal lesions are identified. Neck is clear without evidence of cervical or supraclavicular adenopathy. Lungs are clear to A&P. Cardiac examination is essentially unremarkable with regular rate and rhythm without murmur rub or thrill. Abdomen is benign with no organomegaly or masses noted. Motor sensory and DTR levels are equal and symmetric in the upper and lower extremities. Cranial nerves II through XII are grossly intact. Proprioception is intact. No peripheral adenopathy or edema is identified. No motor or sensory levels are noted. Crude visual fields are within normal range.  LABORATORY DATA: Labs reviewed    RADIOLOGY RESULTS: Brain MRI and CT scan of chest reviewed compatible with above-stated findings   IMPRESSION: Probable stage IV non-small cell lung carcinoma with 2 brain metastasis in 74 year old female  PLAN: This time of ordered a thin slice MRI scan to determine if there is any other lesions in her whole brain.  I believe we can do SRS to both lesions at the same time.  Once we have the thin slice MRI scan we can start treatment planning and I have set her up for CT simulation for early next week.  Risks and benefits of  treatment occluding possible hair loss fatigue alteration of blood counts and need for steroids over the first several weeks after treatment all were described in detail to the patient.  She seems to comprehend my treatment plan well.  In the meantime they are preparing her for possible biopsy to determine histology.  We will follow that.  I would like to take this opportunity to thank you for allowing me to participate in the care of your patient.[name], MD

## 2020-02-06 ENCOUNTER — Other Ambulatory Visit: Payer: Self-pay | Admitting: *Deleted

## 2020-02-06 DIAGNOSIS — C7931 Secondary malignant neoplasm of brain: Secondary | ICD-10-CM

## 2020-02-07 ENCOUNTER — Telehealth: Payer: Self-pay | Admitting: Internal Medicine

## 2020-02-07 ENCOUNTER — Telehealth: Payer: Self-pay

## 2020-02-07 NOTE — Telephone Encounter (Signed)
On 10/05-called [name] to check on patient's clinical status.  As per daughter improving.  Continues to take steroid.  Daughter in agreement with palliative care evaluation.  For now we will follow schedule as planned 10/08-appt; ; radiation start next week.  Bronchoscopy biopsy on the 20th.    C-please add to [name] at next appointment with me on 10/08- if possible.  Thanks GB

## 2020-02-07 NOTE — Telephone Encounter (Signed)
Transition Care Management Follow-up Telephone Call  Date of discharge and from where: 02/05/20 from [facility]  How have you been since you were released from the hospital? I am just a little hoarse, talking more makes me hoarse. I lost 7 pounds since yesterday. Taking lasix as directed. Took macrodantin last 2 days, now holding until appointment complete with pcp, per discharge note. Speech is clear. Denies shortness of breath, facial weakness, cough, confusion, numbness, tingling, dizziness and all other symptoms.   Any questions or concerns? No  Items Reviewed:  Did the pt receive and understand the discharge instructions provided? Yes , watch diet carefully (due to steroid increase of glucose level), avoid driving, increase activity slowly.   Medications obtained and verified? Yes , hold aspirin, ibuprofen, cefdinir, macrodantin. Take all other scheduled medications as directed.   Any new allergies since your discharge? No   Dietary orders reviewed? Yes, low sodium, carb modified/heart healthy.  Do you have support at home? Live alone. Family support in close proximity.   Functional Questionnaire: (I = Independent and D = Dependent) ADLs: i  Transferring/Ambulation- i  Managing Meds- i  Follow up appointments reviewed:   PCP Hospital f/u appt confirmed? Yes  Scheduled to see Dr. Derrel Nip on 02/08/20 @ 10:30.  [facility] f/u appt confirmed? Yes  Scheduled to see pulmonology 02/08/20 @ 3:30 and oncology 02/09/20 @ 10:30 .   Are transportation arrangements needed? No   If their condition worsens, is the pt aware to call PCP or go to the Emergency Dept.? Yes  Was the patient provided with contact information for the PCP's office or ED? Yes  Was to pt encouraged to call back with questions or concerns? Yes

## 2020-02-08 ENCOUNTER — Ambulatory Visit (INDEPENDENT_AMBULATORY_CARE_PROVIDER_SITE_OTHER): Payer: PPO | Admitting: Internal Medicine

## 2020-02-08 ENCOUNTER — Encounter: Payer: Self-pay | Admitting: Internal Medicine

## 2020-02-08 ENCOUNTER — Other Ambulatory Visit: Payer: Self-pay

## 2020-02-08 VITALS — BP 148/90 | HR 70 | Temp 98.5°F | Resp 16 | Ht 63.0 in | Wt 181.2 lb

## 2020-02-08 DIAGNOSIS — Z09 Encounter for follow-up examination after completed treatment for conditions other than malignant neoplasm: Secondary | ICD-10-CM

## 2020-02-08 DIAGNOSIS — I671 Cerebral aneurysm, nonruptured: Secondary | ICD-10-CM

## 2020-02-08 DIAGNOSIS — E1121 Type 2 diabetes mellitus with diabetic nephropathy: Secondary | ICD-10-CM | POA: Diagnosis not present

## 2020-02-08 DIAGNOSIS — E119 Type 2 diabetes mellitus without complications: Secondary | ICD-10-CM | POA: Diagnosis not present

## 2020-02-08 DIAGNOSIS — R9431 Abnormal electrocardiogram [ECG] [EKG]: Secondary | ICD-10-CM | POA: Diagnosis not present

## 2020-02-08 DIAGNOSIS — C7931 Secondary malignant neoplasm of brain: Secondary | ICD-10-CM

## 2020-02-08 DIAGNOSIS — R1900 Intra-abdominal and pelvic swelling, mass and lump, unspecified site: Secondary | ICD-10-CM

## 2020-02-08 DIAGNOSIS — R918 Other nonspecific abnormal finding of lung field: Secondary | ICD-10-CM | POA: Diagnosis not present

## 2020-02-08 DIAGNOSIS — I10 Essential (primary) hypertension: Secondary | ICD-10-CM

## 2020-02-08 LAB — GLUCOSE, POCT (MANUAL RESULT ENTRY): POC Glucose: 251 mg/dl — AB (ref 70–99)

## 2020-02-08 MED ORDER — GLIPIZIDE 5 MG PO TABS: 5.0000 mg | ORAL_TABLET | Freq: Two times a day (BID) | ORAL | 3 refills | Status: DC

## 2020-02-08 NOTE — Patient Instructions (Addendum)
Use the tramadol as needed for back pain. (safer than advil)   Postpone the Palliative Care appt,  Don't cancel it!    Starting glipizide 5 mg two times daily with meals to manage blood sugars  STOP THE METFORMIN FOR NOW   Send me BS readings in a few days.  Check once daily at varying times

## 2020-02-08 NOTE — Telephone Encounter (Signed)
Called patient to go over her information for 10/08 appointment. Patient asked to cancel the appointment with [facility] tomorrow. She did not feel like she needed that appointment at this time and states she will reschedule later.

## 2020-02-08 NOTE — Progress Notes (Signed)
Subjective:  Patient ID: Kimberly Huff, female    DOB: 02-09-1946  Age: 75 y.o. MRN: [id number]  CC: The primary encounter diagnosis was Type 2 diabetes mellitus without complication, without long-term current use of insulin ([place]). Diagnoses of Aneurysm of cavernous portion of right internal carotid artery, Benign essential HTN, Brain metastasis ([place]), Pelvic mass in female, Controlled type 2 diabetes mellitus with diabetic nephropathy, without long-term current use of insulin ([place]), and Hospital discharge follow-up were also pertinent to this visit.  HPI Kimberly Huff presents for hospital follow up  Kimberly Huff is a 74 yr old female with a history of type 2 DM,  Fatty liver Admitted to [facility] on October 1  After having a partial seizure at home , which was manifested as an involuntary contraction fo the left side of her face .  No LOC.  [facility] was [facility] and with CT Scanning  2 right sided brain masses (temporal lobe,  Frontal lobe)  Were found along with an 8 cm  lung mass and a periopthalmic aneurysm .  She was admitted for stabilization and urgent evaluations by oncology  Neurology  and neurosurgery .  She was started on Keppra and dexamethasone and scheduled for outpatient follow-up for all issues for a presumed diagnosis of lung CA with brain mets.  She was discharged home on October 4.  She Sees pulmonary today to discuss bronchoscopy for tissue diagnosis .  A[place]kerov.  Bronch planned for oct 20, after her long awaited trip to the beach with her daughter.   She is scheduled to start treatment of brain mass with XRT ASAP.  She has an appt with palliative care tomorrow but wants to cancel .  I encouarged to postpone it.  She has had a difficult year and reports chronic fatigue,  Low back pain and Shortness of breath.  Had stopped the lasix but since resuming it she has lost 8 lbs at home since her discharge last Friday  Due to excessive urination  She has diet controlled type 2 DM but has not  checked her sugars historically due to stable A1c's. Review of hospital records indicate that her BS were elevated in hospital due to steroids.  No energy from it yet,  Still feels tired.  Lives alone.  Has had 2 loose stools  Yesterday and Tuesday  In the afternoon. Marland Kitchen  Antibiotics (macrodantin) have been stopped. Advised to resume probiotic, takes daily  Since her nephrectomy , but ran out  Medications were reviewed with patient at visit.   Lab Results  Component Value Date   HGBA1C 7.3 (H) 02/02/2020       Outpatient Medications Prior to Visit  Medication Sig Dispense Refill  . Cinnamon 500 MG capsule Take 500 mg by mouth daily. 1000 mg bid    . furosemide (LASIX) 40 MG tablet TAKE ONE TABLET EVERY DAY (Patient taking differently: Take 40 mg by mouth daily. ) 90 tablet 1  . glucose blood (ONE TOUCH ULTRA TEST) test strip Use as instructed 100 each 12  . levETIRAcetam (KEPPRA) 500 MG tablet Take 1 tablet (500 mg total) by mouth 2 (two) times daily. 60 tablet 0  . loperamide (IMODIUM A-D) 2 MG tablet Take 4 mg by mouth 2 (two) times daily as needed for diarrhea or loose stools.    Marland Kitchen losartan (COZAAR) 50 MG tablet TAKE ONE TABLET EVERY DAY (Patient taking differently: Take 50 mg by mouth daily. ) 90 tablet 3  . metFORMIN (  GLUCOPHAGE) 850 MG tablet TAKE ONE TABLET TWICE DAILY WITH A MEAL (Patient taking differently: Take 850 mg by mouth daily with breakfast. ) 180 tablet 1  . omeprazole (PRILOSEC) 40 MG capsule TAKE 1 CAPSULE BY MOUTH ONCE DAILY (Patient taking differently: Take 40 mg by mouth daily. ) 90 capsule 1  . rosuvastatin (CRESTOR) 10 MG tablet TAKE ONE TABLET EVERY DAY (Patient taking differently: Take 10 mg by mouth daily. ) 90 tablet 1  . senna-docusate (SENOKOT-S) 8.6-50 MG tablet Take 2 tablets by mouth daily as needed for mild constipation. 30 tablet 1  . traMADol (ULTRAM) 50 MG tablet Take 1 tablet (50 mg total) by mouth daily as needed. 30 tablet 0  . Vitamin D,  Ergocalciferol, (DRISDOL) 1.25 MG (50000 UNIT) CAPS capsule TAKE ONE CAPSULE EACH WEEK (Patient taking differently: Take 50,000 Units by mouth every 7 (seven) days. Friday) 4 capsule 11  . dexamethasone (DECADRON) 4 MG tablet Take 1 tablet (4 mg total) by mouth every 8 (eight) hours. 30 tablet 0   No facility-administered medications prior to visit.    Review of Systems;  Patient denies headache, fevers, , unintentional weight loss, skin rash, eye pain, sinus congestion and sinus pain, sore throat, dysphagia,  hemoptysis , cough, wheezing, chest pain, palpitations, orthopnea, edema, abdominal pain, nausea, melena, diarrhea, constipation, flank pain, dysuria, hematuria, urinary  Frequency, nocturia, numbness, tingling, seizures,  Focal weakness, Loss of consciousness,  Tremor, insomnia, depression, anxiety, and suicidal ideation.      Objective:  BP (!) 148/90 (BP Location: Left Arm, Patient Position: Sitting, Cuff Size: Normal)   Pulse 70   Temp 98.5 F (36.9 C) (Oral)   Resp 16   Ht 5' 3"  (1.6 m)   Wt 181 lb 3.2 oz (82.2 kg)   SpO2 98%   BMI 32.10 kg/m   BP Readings from Last 3 Encounters:  02/09/20 (!) 139/57  02/08/20 (!) 148/90  02/05/20 (!) 130/51    Wt Readings from Last 3 Encounters:  02/09/20 181 lb (82.1 kg)  02/08/20 181 lb 3.2 oz (82.2 kg)  02/05/20 189 lb 7.1 oz (85.9 kg)    General appearance: alert, cooperative and appears stated age Ears: normal TM's and external ear canals both ears Throat: lips, mucosa, and tongue normal; teeth and gums normal Neck: no adenopathy, no carotid bruit, supple, symmetrical, trachea midline and thyroid not enlarged, symmetric, no tenderness/mass/nodules Back: symmetric, no curvature. ROM normal. No CVA tenderness. Lungs: clear to auscultation bilaterally Heart: regular rate and rhythm, S1, S2 normal, no murmur, click, rub or gallop Abdomen: soft, non-tender; bowel sounds normal; no masses,  no organomegaly Pulses: 2+ and  symmetric Skin: Skin color, texture, turgor normal. No rashes or lesions Lymph nodes: Cervical, supraclavicular, and axillary nodes normal.  Lab Results  Component Value Date   HGBA1C 7.3 (H) 02/02/2020   HGBA1C 7.3 (H) 01/29/2020   HGBA1C 6.8 (H) 10/04/2019    Lab Results  Component Value Date   CREATININE 0.89 02/09/2020   CREATININE 0.78 02/03/2020   CREATININE 0.81 02/02/2020    Lab Results  Component Value Date   WBC 13.1 (H) 02/09/2020   HGB 14.1 02/09/2020   HCT 42.9 02/09/2020   PLT 248 02/09/2020   GLUCOSE 214 (H) 02/09/2020   CHOL 101 01/29/2020   TRIG 201.0 (H) 01/29/2020   HDL 25.20 (L) 01/29/2020   LDLDIRECT 58.0 01/29/2020   LDLCALC 38 10/04/2019   ALT 41 02/09/2020   AST 22 02/09/2020   NA 136  02/09/2020   K 3.9 02/09/2020   CL 99 02/09/2020   CREATININE 0.89 02/09/2020   BUN 42 (H) 02/09/2020   CO2 26 02/09/2020   TSH 0.76 09/10/2014   INR 1.1 02/02/2020   HGBA1C 7.3 (H) 02/02/2020   MICROALBUR 3.8 (H) 01/29/2020    MR ANGIO HEAD WO CONTRAST  Result Date: 02/02/2020 CLINICAL DATA:  Seizure. EXAM: MRA HEAD WITHOUT CONTRAST TECHNIQUE: Angiographic images of the Circle of Willis were obtained using MRA technique without intravenous contrast. COMPARISON:  MRI head 02/02/2020 FINDINGS: Left vertebral artery dominant. Both vertebral arteries patent to the basilar without stenosis. Left PICA patent. Probable small right PICA. Small right AICA patent. Basilar widely patent. Superior cerebellar and posterior cerebral arteries patent without stenosis. Lobulated aneurysm right cavernous carotid in the Peri ophthalmic region. This measures approximately 6 x 7 mm. Cavernous carotid is patent bilaterally without stenosis Anterior and middle cerebral arteries patent bilaterally without stenosis or additional aneurysm. 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 Electronically Signed   By: Franchot Gallo M.D.   On:  02/02/2020 11:28   MR ANGIO NECK W WO CONTRAST  Result Date: 02/02/2020 CLINICAL DATA:  Seizure. EXAM: MRA NECK WITHOUT AND WITH CONTRAST TECHNIQUE: Multiplanar and multiecho pulse sequences of the neck were obtained without and with intravenous contrast. Angiographic images of the neck were obtained using MRA technique without and with intravenous contrast. CONTRAST:  106m GADAVIST GADOBUTROL 1 MMOL/ML IV SOLN COMPARISON:  None. FINDINGS: Normal aortic arch. Proximal great vessels widely patent without stenosis. Carotid bifurcation is normal bilaterally. No significant stenosis or irregularity Antegrade flow in both vertebral arteries. Left vertebral artery dominant. No vertebral artery stenosis. Aneurysm right cavernous carotid. IMPRESSION: Negative MRA neck. Aneurysm right cavernous carotid. Electronically Signed   By: CFranchot GalloM.D.   On: 02/02/2020 11:25   MR BRAIN W WO CONTRAST  Result Date: 02/02/2020 CLINICAL DATA:  Seizure.  Abnormal CT head. EXAM: MRI HEAD WITHOUT AND WITH CONTRAST TECHNIQUE: Multiplanar, multiecho pulse sequences of the brain and surrounding structures were obtained without and with intravenous contrast. CONTRAST:  980mGADAVIST GADOBUTROL 1 MMOL/ML IV SOLN COMPARISON:  CT head 02/02/2020 FINDINGS: Brain: Peripheral enhancing mass lesion in the right middle frontal lobe laterally with central necrosis and surrounding edema. The enhancing mass measures up to 15 mm in diameter. This is peripherally located. Second peripherally enhancing mass lesion in the right posterior temporal lobe measures 9 mm in diameter with surrounding vasogenic edema. No third lesion identified. Negative for acute infarct. Scattered small white matter hyperintensities compatible with mild chronic microvascular ischemia. Negative for hydrocephalus or hemorrhage. Vascular: Normal arterial flow voids Skull and upper cervical spine: No focal skeletal lesion. Sinuses/Orbits: Mild mucosal edema paranasal  sinuses. Bilateral cataract extraction Other: None IMPRESSION: Two enhancing mass lesions are present in the right cerebral hemisphere with surrounding edema and central necrosis compatible with metastatic disease. No midline shift Mild chronic microvascular ischemic change in the white matter. No acute infarct. Electronically Signed   By: ChFranchot Gallo.D.   On: 02/02/2020 11:22   CT CHEST ABDOMEN PELVIS W CONTRAST  Result Date: 02/02/2020 CLINICAL DATA:  [age]ear old female with abnormal brain MRI and CT today compatible with cerebral metastases. Unknown primary. History of left oophorectomy for nonfunctioning left kidney. EXAM: CT CHEST, ABDOMEN, AND PELVIS WITH CONTRAST TECHNIQUE: Multidetector CT imaging of the chest, abdomen and pelvis was performed following the standard protocol during bolus administration of intravenous  contrast. CONTRAST:  133m OMNIPAQUE IOHEXOL 300 MG/ML  SOLN COMPARISON:  Brain MRI today. [facility] CT Abdomen and Pelvis 07/28/2019 and earlier. FINDINGS: CT CHEST FINDINGS Cardiovascular: Calcified aortic atherosclerosis. Calcified coronary artery atherosclerosis. No cardiomegaly or pericardial effusion. Mediastinum/Nodes: Left upper lung mass inseparable from the mediastinum including the aortic arch (series 5, image 84). But mediastinal lymph nodes remains small, up to 6 mm short axis. Lungs/Pleura: Lobulated up to 8 cm mass of the medial left upper lung inseparable from the anterior lung apex and mediastinum. No associated left pleural effusion. There is contralateral right lower lobe small but spiculated nodule measuring about 11 mm (series 5, image 102). There are also two smaller sub solid nodules in the right upper lobe on series 4, image 48 and image 70. Superimposed scattered bilateral subpleural pulmonary scarring. Musculoskeletal: Stable mild T4 superior endplate compression. No acute or suspicious osseous lesion identified in the chest. CT ABDOMEN  PELVIS FINDINGS Hepatobiliary: Hepatic steatosis and mildly nodular liver contour (series 2, image 55) suspicious for cirrhosis. No discrete liver lesion. Negative gallbladder. Pancreas: Partially atrophied but otherwise negative. Spleen: Negative.  Incidental splenule (normal variant). Adrenals/Urinary Tract: Normal adrenal glands. Surgically absent left kidney as before. Right renal enhancement and contrast excretion is preserved. An anterior midpole exophytic low-density renal mass measures up to 3.8 cm diameter now, versus 2.2 cm in 2017. This has low to intermediate density by CT. On 2018 renal ultrasound this appeared simple and benign. Negative right ureter. Diminutive and unremarkable urinary bladder. Stomach/Bowel: Diverticulosis of the sigmoid colon. Oral contrast has reached the splenic flexure without obstruction. Decompressed ascending and proximal transverse colon. Negative appendix. Decompressed terminal ileum. No dilated small bowel. Unremarkable stomach. Chronic duodenal diverticulum near the midline appears inconsequential. No free air. No free fluid. Vascular/Lymphatic: Extensive Aortoiliac calcified atherosclerosis. Major arterial structures remain patent. Portal venous system is patent. No lymphadenopathy. Reproductive: Surgically absent uterus. Ovaries are within normal limits on series 2, image 105). Other: No pelvic free fluid. Small umbilical hernia with trace fluid is stable. Musculoskeletal: Advanced lumbar spine degeneration including grade 1 spondylolisthesis in the lower lumbar spine with bulky facet hypertrophy. No acute or suspicious osseous lesion identified. There is a chronic oval mixed density soft tissue mass along the anterior lower sacrum which has only mildly increased in size since 2017, measuring up to 4 cm diameter now on series 2, image 102. This is stable since March. IMPRESSION: 1. Positive for an 8 cm left upper lung mass inseparable from the mediastinum compatible with  Bronchogenic Carcinoma. 2. Mediastinal lymph nodes remain normal by size criteria. There are three indeterminate small but spiculated right lung nodules, the largest measuring 11 mm in the lower lobe. 3. No metastatic disease identified in the abdomen or pelvis. 4. Suspicion of Cirrhosis with hepatic steatosis. 5. Chronic presacral soft tissue mass favored to be benign myelolipoma. Chronic right renal cyst which had a benign ultrasound appearance in 2018. Sigmoid diverticulosis. Duodenal diverticulum. 6. Calcified coronary artery and Aortic atherosclerosis (ICD10-I70.0). Electronically Signed   By: HGenevie AnnM.D.   On: 02/02/2020 20:54   CT HEAD CODE STROKE WO CONTRAST  Result Date: 02/02/2020 CLINICAL DATA:  Code stroke. Slurred speech and left-sided numbness. EXAM: CT HEAD WITHOUT CONTRAST TECHNIQUE: Contiguous axial images were obtained from the base of the skull through the vertex without intravenous contrast. COMPARISON:  None. FINDINGS: Brain: 2 areas moderate edema in the right cerebral hemisphere, lateral frontal and temporoparietal. Although both in the MCA distribution  these show preserved cortex, more suggestive of vasogenic than cytotoxic edema. No acute hemorrhage, hydrocephalus, or shift. Vascular: No hyperdense vessel. Skull: Normal. Negative for fracture or focal lesion. Sinuses/Orbits: Bilateral cataract resection Other: Critical Value/emergent results were called by telephone at the time of interpretation on 02/02/2020 at 9:16 am to provider Merlyn Lot , who verbally acknowledged these results. ASPECTS [facility] Stroke Program Early CT Score) Not scored given the above IMPRESSION: Two areas of edema in the right cerebral hemisphere. Although both are located in the MCA distribution, these have the appearance of vasogenic edema - more or worrisome for mass lesion than infarct. Recommend brain MRI with contrast. Electronically Signed   By: Monte Fantasia M.D.   On: 02/02/2020 09:19     Assessment & Plan:   Problem List Items Addressed This Visit      Unprioritized   Aneurysm of cavernous portion of right internal carotid artery    6-7 mm.  She was seen by neurosurgery during hospitalization ,  Outpatient follow up recommended. Goal BP 120/70 or below.  she reports compliance with medication regimen  but has an elevated reading today in office.  She is not using NSAIDs daily.   She has been asked to check her  BP  at home and  submit readings for evaluation.   Lab Results  Component Value Date   CREATININE 0.89 02/09/2020   Lab Results  Component Value Date   NA 136 02/09/2020   K 3.9 02/09/2020   CL 99 02/09/2020   CO2 26 02/09/2020         Benign essential HTN    Currently taking losartan 50 mg daily. Will increase to 100 mg daily if home readings are > 120/70  Lab Results  Component Value Date   CREATININE 0.89 02/09/2020   Lab Results  Component Value Date   NA 136 02/09/2020   K 3.9 02/09/2020   CL 99 02/09/2020   CO2 26 02/09/2020         Brain metastasis ([place])    Presenting with seizure. she has two lesions,  One 1.5 cm and one < 1 cm  That are presumed metastatic from primary lung cancer .  has been advised not to drive until the masses have been reduced by XRT . Continue Keppra and dexamethasone       Diabetes mellitus ([place]) - Primary   Relevant Medications   glipiZIDE (GLUCOTROL) 5 MG tablet   Other Relevant Orders   POCT Glucose (CBG) (Completed)   Hospital discharge follow-up    Patient is stable post discharge and has no new issues or questions about discharge plans at the visit today for hospital follow up. All labs , imaging studies and progress notes from admission were reviewed with patient today  And medications were reconciled      Pelvic mass in female    Stable by repeat imaging, presumed to be a myolipoma       Type 2 diabetes mellitus, controlled, with renal complications ([facility])    Glipizide added starting at 5 mg  bid.  Metformin avoided given anticipation of chemotherapy.  She is reluctant to use insulin due to needle phobia,  But the dexamethasone has created hyperglycemia        Relevant Medications   glipiZIDE (GLUCOTROL) 5 MG tablet      I am having Kimberly Huff "[name]" start on glipiZIDE. I am also having her maintain her glucose blood, loperamide, senna-docusate, metFORMIN, Vitamin D (Ergocalciferol),  Cinnamon, rosuvastatin, furosemide, omeprazole, losartan, traMADol, and levETIRAcetam.  Meds ordered this encounter  Medications  . glipiZIDE (GLUCOTROL) 5 MG tablet    Sig: Take 1 tablet (5 mg total) by mouth 2 (two) times daily before a meal.    Dispense:  60 tablet    Refill:  3    There are no discontinued medications.  Follow-up: Return in about 4 weeks (around 03/07/2020).   [name], MD

## 2020-02-09 ENCOUNTER — Inpatient Hospital Stay: Payer: PPO | Admitting: Hospice and Palliative Medicine

## 2020-02-09 ENCOUNTER — Inpatient Hospital Stay: Payer: PPO

## 2020-02-09 ENCOUNTER — Encounter: Payer: Self-pay | Admitting: *Deleted

## 2020-02-09 ENCOUNTER — Other Ambulatory Visit: Payer: Self-pay | Admitting: Pulmonary Disease

## 2020-02-09 ENCOUNTER — Inpatient Hospital Stay: Payer: PPO | Attending: Internal Medicine | Admitting: Internal Medicine

## 2020-02-09 DIAGNOSIS — Z87891 Personal history of nicotine dependence: Secondary | ICD-10-CM | POA: Diagnosis not present

## 2020-02-09 DIAGNOSIS — C7931 Secondary malignant neoplasm of brain: Secondary | ICD-10-CM | POA: Insufficient documentation

## 2020-02-09 DIAGNOSIS — R569 Unspecified convulsions: Secondary | ICD-10-CM | POA: Diagnosis not present

## 2020-02-09 DIAGNOSIS — R042 Hemoptysis: Secondary | ICD-10-CM | POA: Insufficient documentation

## 2020-02-09 DIAGNOSIS — C3412 Malignant neoplasm of upper lobe, left bronchus or lung: Secondary | ICD-10-CM | POA: Diagnosis not present

## 2020-02-09 DIAGNOSIS — R918 Other nonspecific abnormal finding of lung field: Secondary | ICD-10-CM | POA: Insufficient documentation

## 2020-02-09 DIAGNOSIS — E669 Obesity, unspecified: Secondary | ICD-10-CM | POA: Diagnosis not present

## 2020-02-09 DIAGNOSIS — E119 Type 2 diabetes mellitus without complications: Secondary | ICD-10-CM | POA: Insufficient documentation

## 2020-02-09 LAB — COMPREHENSIVE METABOLIC PANEL
ALT: 41 U/L (ref 0–44)
AST: 22 U/L (ref 15–41)
Albumin: 4.3 g/dL (ref 3.5–5.0)
Alkaline Phosphatase: 43 U/L (ref 38–126)
Anion gap: 11 (ref 5–15)
BUN: 42 mg/dL — ABNORMAL HIGH (ref 8–23)
CO2: 26 mmol/L (ref 22–32)
Calcium: 9.3 mg/dL (ref 8.9–10.3)
Chloride: 99 mmol/L (ref 98–111)
Creatinine, Ser: 0.89 mg/dL (ref 0.44–1.00)
GFR calc non Af Amer: >60 mL/min (ref >60)
Glucose, Bld: 214 mg/dL — ABNORMAL HIGH (ref 70–99)
Potassium: 3.9 mmol/L (ref 3.5–5.1)
Sodium: 136 mmol/L (ref 135–145)
Total Bilirubin: 1 mg/dL (ref 0.3–1.2)
Total Protein: 7.4 g/dL (ref 6.5–8.1)

## 2020-02-09 LAB — CBC WITH DIFFERENTIAL/PLATELET
Abs Immature Granulocytes: 0.12 10*3/uL — ABNORMAL HIGH (ref 0.00–0.07)
Basophils Absolute: 0 10*3/uL (ref 0.0–0.1)
Basophils Relative: 0 %
Eosinophils Absolute: 0.1 10*3/uL (ref 0.0–0.5)
Eosinophils Relative: 1 %
HCT: 42.9 % (ref 36.0–46.0)
Hemoglobin: 14.1 g/dL (ref 12.0–15.0)
Immature Granulocytes: 1 %
Lymphocytes Relative: 25 %
Lymphs Abs: 3.3 10*3/uL (ref 0.7–4.0)
MCH: 26.2 pg (ref 26.0–34.0)
MCHC: 32.9 g/dL (ref 30.0–36.0)
MCV: 79.7 fL — ABNORMAL LOW (ref 80.0–100.0)
Monocytes Absolute: 0.5 10*3/uL (ref 0.1–1.0)
Monocytes Relative: 4 %
Neutro Abs: 9 10*3/uL — ABNORMAL HIGH (ref 1.7–7.7)
Neutrophils Relative %: 69 %
Platelets: 248 10*3/uL (ref 150–400)
RBC: 5.38 MIL/uL — ABNORMAL HIGH (ref 3.87–5.11)
RDW: 14.5 % (ref 11.5–15.5)
WBC: 13.1 10*3/uL — ABNORMAL HIGH (ref 4.0–10.5)
nRBC: 0 % (ref 0.0–0.2)

## 2020-02-09 MED ORDER — DEXAMETHASONE 4 MG PO TABS: 4.0000 mg | ORAL_TABLET | Freq: Two times a day (BID) | ORAL | 0 refills | Status: DC

## 2020-02-09 NOTE — Assessment & Plan Note (Addendum)
#  Left lung mass/invading the mediastinum highly suspicious for lung cancer.  S/p evaluation with pulmonary. Awaiting bronchoscopy/biopsy on 10/20.     #Seizure secondary to brain metastases/edema x2 [10 to 15 millimeters]; improvement of symptoms noted.  On dexamethasone 4 mg BID-tolerating well except for elevated blood pressure see below. Continue Keppra. Plan RT SBRT- on 10/13.   # DM- poorly controlled- as per pt 400 at home; post BF- 214 this AM; continue on glipizide 5 mg BID [followed by Dr.Tullo]; currently off Metformin.   #Solitary kidney-normal renal function.  Monitor closely.  # DISPOSITION: # Follow up on 10/25- MD; labs- cbc/CMP;CEA- Dr.B

## 2020-02-09 NOTE — Progress Notes (Signed)
[place] CONSULT NOTE  Patient Care Team: [name], MD as PCP - General (Internal Medicine) [name], MD (General Surgery) [name], MD (Internal Medicine) Telford Nab, RN as Oncology Nurse Navigator  CHIEF COMPLAINTS/PURPOSE OF CONSULTATION:  Brain metastases  #  Oncology History Overview Note  # OCT 2021-left upper lobe lung mass invading mediastinum; bronc/Bx- pending Oct 20th  #Right frontal/temporal brain met x2 [1-1.98m]-seizure- on dex; Keppra; SBRT planned Oct 13th  # DM on OHA; HTN; SOLITARY KIDNEY [frequent infections/ atrophic- s/p nephrectomy 2020]  # NGS/MOLECULAR TESTS:    # PALLIATIVE CARE EVALUATION:  # PAIN MANAGEMENT:    DIAGNOSIS:   STAGE:         ;  GOALS:  CURRENT/MOST RECENT THERAPY :     [place]rain [place] ([facility]  02/02/2020 Initial Diagnosis   Brain metastasis ([facility]    HISTORY OF PRESENTING ILLNESS:  Kimberly [id number].  female  patient with a history of solitary kidney; diabetes obesity; prior history of smoking was recently admitted to hospital for hospital for difficulty in speech/facial twitching.  On further work-up diagnosed to have metastatic lesions to brain; further work-up including CT chest showed left upper lobe mass-highly suspicious for bronchogenic carcinoma.  Patient was started on steroids; and also on Keppra.  She was evaluated by neurology; also evaluated by neurosurgery.  She was felt not to be a surgical candidate.  Patient was evaluated by pulmonary-awaiting bronchoscopy outpatient.  Patient was also evaluated by radiation oncology.  Patient is currently home.  Denies any further seizure episodes.  She is currently on dexamethasone 4 mg twice a day.   Patient is excited of her upcoming beach trip.   Review of Systems  Constitutional: Positive for malaise/fatigue. Negative for chills, diaphoresis, fever and weight loss.  HENT: Negative for nosebleeds and sore throat.   Eyes:  Negative for double vision.  Respiratory: Positive for shortness of breath. Negative for cough, hemoptysis, sputum production and wheezing.   Cardiovascular: Negative for chest pain, palpitations, orthopnea and leg swelling.  Gastrointestinal: Negative for abdominal pain, blood in stool, constipation, diarrhea, heartburn, melena, nausea and vomiting.  Genitourinary: Negative for dysuria, frequency and urgency.  Musculoskeletal: Negative for back pain and joint pain.  Skin: Negative.  Negative for itching and rash.  Neurological: Negative for dizziness, tingling, focal weakness, weakness and headaches.  Endo/Heme/Allergies: Does not bruise/bleed easily.  Psychiatric/Behavioral: Negative for depression. The patient is not nervous/anxious and does not have insomnia.      MEDICAL HISTORY:  Past Medical History:  Diagnosis Date  . Allergy   . Atrophic kidney    left  . Blood clot in vein 45 yrs ago   inabdoment after surgery  . Blood transfusion without reported diagnosis   . Cataract   . Complication of anesthesia   . Coronary artery disease   . Coronary atherosclerosis   . Diabetes mellitus   . Diverticulosis 2005   Dr SJamal Collin . Dyspnea    with exertion  . External hemorrhoids without mention of complication 28938 . GERD (gastroesophageal reflux disease)   . H/O cardiac catheterization 2005  . Heart murmur 2012   found by Dr. SJamal Collin . Hyperlipidemia   . Hypertension   . Kidney stone   . Lesion of bladder   . Myocardial infarction ([facility] 2000  . Neuromuscular disorder ([facility]    "achy muscles"  . Obesity   . Osteoarthritis   . PONV (postoperative nausea  and vomiting)   . Recurrent UTI   . Spinal stenosis of cervical region   . Steatohepatitis 05/30/10   Brunt Stage 3, non alcoholic cirrhosis of the liver  . Type II or unspecified type diabetes mellitus without mention of complication, uncontrolled    Patient does takes Metformin and recommended to stop for 48 hours.  Marland Kitchen  Ulcer   . Unspecified essential hypertension 2014    SURGICAL HISTORY: Past Surgical History:  Procedure Laterality Date  . ABDOMINAL HYSTERECTOMY  1982   complete  . BREAST CYST ASPIRATION Right 1990's   benign  . BREAST MASS EXCISION     benign  . CARDIAC CATHETERIZATION  2000   cardiac stent  . cardiac stents  2000  . CATARACT EXTRACTION     both eyes  . COLONOSCOPY  2004, 2015   Dr. Jamal Collin, Dr. Olevia Perches  . CYSTOSCOPY W/ RETROGRADES Left 11/12/2015   Procedure: CYSTOSCOPY WITH RETROGRADE PYELOGRAM;  Surgeon: Cleon Gustin, MD;  Location: [facility];  Service: Urology;  Laterality: Left;  . CYSTOSCOPY W/ URETERAL STENT PLACEMENT Left 11/12/2015   Procedure: CYSTOSCOPY WITH STENT REPLACEMENT;  Surgeon: Cleon Gustin, MD;  Location: [facility];  Service: Urology;  Laterality: Left;  . CYSTOSCOPY WITH BIOPSY N/A 09/26/2019   Procedure: CYSTOSCOPY WITH BIOPSY AND FULGURATION;  Surgeon: Cleon Gustin, MD;  [facility]  Service: Urology;  Laterality: N/A;  . CYSTOSCOPY WITH URETEROSCOPY AND STENT PLACEMENT Left 10/15/2015   Procedure: CYSTOSCOPY WITH URETEROSCOPY AND STENT PLACEMENT;  Surgeon: Cleon Gustin, MD;  Location: [facility];  Service: Urology;  Laterality: Left;  . EYE SURGERY Bilateral 2012   cataract  . FOOT SURGERY  2008  . PTCA  2000   stent x 1  . ROBOTIC ASSITED PARTIAL NEPHRECTOMY Left 06/02/2019   Procedure: XI ROBOTIC ASSITED NEPHRECTOMY;  Surgeon: Cleon Gustin, MD;  Location: [facility];  Service: Urology;  Laterality: Left;  . SALPINGOOPHORECTOMY    . TONSILLECTOMY    . UPPER GI ENDOSCOPY    . URETEROSCOPY WITH HOLMIUM LASER LITHOTRIPSY Left 11/12/2015   Procedure: URETEROSCOPY WITH HOLMIUM LASER LITHOTRIPSY;  Surgeon: Cleon Gustin, MD;  Location: [facility];  Service: Urology;  Laterality: Left;  . URETEROTOMY  1960/1969   x 2 during childhood  . VAGINAL HYSTERECTOMY      SOCIAL HISTORY: Social History    Socioeconomic History  . Marital status: Single    Spouse name: Not on file  . Number of children: 2  . Years of education: Not on file  . Highest education level: Not on file  Occupational History  . 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 Smoking status: Former Smoker    Packs/day: 0.50    Years: 38.00    Pack years: 19.00    Types: Cigarettes    Quit date: 09/15/1998    Years since quitting: 21.4  . Smokeless tobacco: Never Used  Vaping Use  . Vaping Use: Never used  Substance and Sexual Activity  . Alcohol use: Not Currently  . Drug use: No  . Sexual activity: Not Currently  Other Topics Concern  . Not on file  Social History Narrative   Lives in [place]; daughter lives in [place]. Quit smoking may 14th 2000. No alcohol. Retired  From [employer] job.    Social Determinants of Health   Financial Resource Strain: Low Risk   . Difficulty of Paying Living  Expenses: Not hard at all  Food Insecurity: No Food Insecurity  . Worried About Charity fundraiser in the Last Year: Never true  . Ran Out of Food in the Last Year: Never true  Transportation Needs: No Transportation Needs  . Lack of Transportation (Medical): No  . Lack of Transportation (Non-Medical): No  Physical Activity:   . Days of Exercise per Week: Not on file  . Minutes of Exercise per Session: Not on file  Stress: No Stress Concern Present  . Feeling of Stress : Not at all  Social Connections: Unknown  . Frequency of Communication with Friends and Family: More than three times a week  . Frequency of Social Gatherings with Friends and Family: More than three times a week  . Attends Religious Services: More than 4 times per year  . Active Member of Clubs or Organizations: Yes  . Attends Archivist Meetings: More than 4 times per year  . Marital Status: Not on file  Intimate Partner Violence: Not At Risk  . Fear of Current or Ex-Partner: No  .  Emotionally Abused: No  . Physically Abused: No  . Sexually Abused: No    FAMILY HISTORY: Family History  Problem Relation Age of Onset  . Lung cancer Father   . Heart disease Father   . Diverticulosis Mother   . Diabetes Mother   . Stroke Mother 23  . Autoimmune disease Daughter   . Thyroid disease Daughter   . Thyroid disease Sister   . Stomach cancer Maternal Aunt   . Breast cancer Maternal Aunt   . Colon cancer Neg Hx   . Esophageal cancer Neg Hx   . Rectal cancer Neg Hx     ALLERGIES:  has No Known Allergies.  MEDICATIONS:  Current Outpatient Medications  Medication Sig Dispense Refill  . Cinnamon 500 MG capsule Take 500 mg by mouth daily. 1000 mg bid    . dexamethasone (DECADRON) 4 MG tablet Take 1 tablet (4 mg total) by mouth 2 (two) times daily. 30 tablet 0  . furosemide (LASIX) 40 MG tablet TAKE ONE TABLET EVERY DAY (Patient taking differently: Take 40 mg by mouth daily. ) 90 tablet 1  . glipiZIDE (GLUCOTROL) 5 MG tablet Take 1 tablet (5 mg total) by mouth 2 (two) times daily before a meal. 60 tablet 3  . glucose blood (ONE TOUCH ULTRA TEST) test strip Use as instructed 100 each 12  . levETIRAcetam (KEPPRA) 500 MG tablet Take 1 tablet (500 mg total) by mouth 2 (two) times daily. 60 tablet 0  . loperamide (IMODIUM A-D) 2 MG tablet Take 4 mg by mouth 2 (two) times daily as needed for diarrhea or loose stools.    Marland Kitchen losartan (COZAAR) 50 MG tablet TAKE ONE TABLET EVERY DAY (Patient taking differently: Take 50 mg by mouth daily. ) 90 tablet 3  . metFORMIN (GLUCOPHAGE) 850 MG tablet TAKE ONE TABLET TWICE DAILY WITH A MEAL (Patient taking differently: Take 850 mg by mouth daily with breakfast. ) 180 tablet 1  . omeprazole (PRILOSEC) 40 MG capsule TAKE 1 CAPSULE BY MOUTH ONCE DAILY (Patient taking differently: Take 40 mg by mouth daily. ) 90 capsule 1  . rosuvastatin (CRESTOR) 10 MG tablet TAKE ONE TABLET EVERY DAY (Patient taking differently: Take 10 mg by mouth daily. ) 90  tablet 1  . senna-docusate (SENOKOT-S) 8.6-50 MG tablet Take 2 tablets by mouth daily as needed for mild constipation. 30 tablet 1  . traMADol (  ULTRAM) 50 MG tablet Take 1 tablet (50 mg total) by mouth daily as needed. 30 tablet 0  . Vitamin D, Ergocalciferol, (DRISDOL) 1.25 MG (50000 UNIT) CAPS capsule TAKE ONE CAPSULE EACH WEEK (Patient taking differently: Take 50,000 Units by mouth every 7 (seven) days. Friday) 4 capsule 11   No current facility-administered medications for this visit.      Marland Kitchen  PHYSICAL EXAMINATION: ECOG PERFORMANCE STATUS: 1 - Symptomatic but completely ambulatory  Vitals:   02/09/20 1018  BP: (!) 139/57  Pulse: (!) 54  Resp: 20  Temp: (!) 97.3 F (36.3 C)   Filed Weights   02/09/20 1018  Weight: 181 lb (82.1 kg)    Physical Exam Vitals and nursing note reviewed.  Constitutional:      Comments: Patient is alone.  She is walking independently.  HENT:     Head: Normocephalic and atraumatic.     Mouth/Throat:     Pharynx: No oropharyngeal exudate.  Eyes:     Pupils: Pupils are equal, round, and reactive to light.  Cardiovascular:     Rate and Rhythm: Normal rate and regular rhythm.  Pulmonary:     Effort: No respiratory distress.     Breath sounds: No wheezing.  Abdominal:     General: Bowel sounds are normal. There is no distension.     Palpations: Abdomen is soft. There is no mass.     Tenderness: There is no abdominal tenderness. There is no guarding or rebound.  Musculoskeletal:        General: No tenderness. Normal range of motion.     Cervical back: Normal range of motion and neck supple.  Skin:    General: Skin is warm.  Neurological:     Mental Status: She is alert and oriented to person, place, and time.  Psychiatric:        Mood and Affect: Affect normal.      LABORATORY DATA:  I have reviewed the data as listed Lab Results  Component Value Date   WBC 13.1 (H) 02/09/2020   HGB 14.1 02/09/2020   HCT 42.9 02/09/2020   MCV 79.7  (L) 02/09/2020   PLT 248 02/09/2020   Recent Labs    06/03/19 0434 06/29/19 0819 01/29/20 0856 01/29/20 0856 02/02/20 0925 02/03/20 0541 02/09/20 1002  NA 136   < > 139   < > 137 133* 136  K 4.2   < > 5.1   < > 4.2 4.3 3.9  CL 104   < > 101   < > 100 99 99  CO2 26   < > 29   < > 27 24 26   GLUCOSE 125*   < > 169*   < > 177* 248* 214*  BUN 11   < > 23   < > 16 18 42*  CREATININE 0.62   < > 0.78   < > 0.81 0.78 0.89  CALCIUM 9.0   < > 9.4   < > 9.6 9.2 9.3  GFRNONAA >60  --   --   --  >60 >60 >60  GFRAA >60  --   --   --  >60 >60  --   PROT  --    < > 6.7  --  7.7  --  7.4  ALBUMIN  --    < > 4.3  --  4.5  --  4.3  AST  --    < > 19  --  29  --  22  ALT  --    < > 25  --  31  --  41  ALKPHOS  --    < > 43  --  43  --  43  BILITOT  --    < > 0.6  --  0.9  --  1.0   < > = values in this interval not displayed.    RADIOGRAPHIC STUDIES: I have personally reviewed the radiological images as listed and agreed with the findings in the report. MR ANGIO HEAD WO CONTRAST  Result Date: 02/02/2020 CLINICAL DATA:  Seizure. EXAM: MRA HEAD WITHOUT CONTRAST TECHNIQUE: Angiographic images of the Circle of Willis were obtained using MRA technique without intravenous contrast. COMPARISON:  MRI head 02/02/2020 FINDINGS: Left vertebral artery dominant. Both vertebral arteries patent to the basilar without stenosis. Left PICA patent. Probable small right PICA. Small right AICA patent. Basilar widely patent. Superior cerebellar and posterior cerebral arteries patent without stenosis. Lobulated aneurysm right cavernous carotid in the Peri ophthalmic region. This measures approximately 6 x 7 mm. Cavernous carotid is patent bilaterally without stenosis Anterior and middle cerebral arteries patent bilaterally without stenosis or additional aneurysm. 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 Electronically Signed   By: Franchot Gallo M.D.   On: 02/02/2020  11:28   MR ANGIO NECK W WO CONTRAST  Result Date: 02/02/2020 CLINICAL DATA:  Seizure. EXAM: MRA NECK WITHOUT AND WITH CONTRAST TECHNIQUE: Multiplanar and multiecho pulse sequences of the neck were obtained without and with intravenous contrast. Angiographic images of the neck were obtained using MRA technique without and with intravenous contrast. CONTRAST:  70m GADAVIST GADOBUTROL 1 MMOL/ML IV SOLN COMPARISON:  None. FINDINGS: Normal aortic arch. Proximal great vessels widely patent without stenosis. Carotid bifurcation is normal bilaterally. No significant stenosis or irregularity Antegrade flow in both vertebral arteries. Left vertebral artery dominant. No vertebral artery stenosis. Aneurysm right cavernous carotid. IMPRESSION: Negative MRA neck. Aneurysm right cavernous carotid. Electronically Signed   By: CFranchot GalloM.D.   On: 02/02/2020 11:25   MR BRAIN W WO CONTRAST  Result Date: 02/05/2020 CLINICAL DATA:  Brain mass or lesion.  Recent seizure. EXAM: MRI HEAD WITHOUT AND WITH CONTRAST TECHNIQUE: Multiplanar, multiecho pulse sequences of the brain and surrounding structures were obtained without and with intravenous contrast. CONTRAST:  948mGADAVIST GADOBUTROL 1 MMOL/ML IV SOLN COMPARISON:  02/02/2020 FINDINGS: Brain: Diffusion imaging does not show any acute or subacute infarction or other cause of restricted diffusion. No abnormality is seen affecting the brainstem or cerebellum. The left cerebral hemisphere shows mild to moderate chronic small-vessel ischemic changes of the white matter. No cortical or large vessel territory infarction. No evidence of mass or abnormal enhancement affecting the left hemisphere. On the right, there are 2 enhancing mass lesions with central necrosis. Right posterior temporal lobe lesion axial image 75 measures 1 cm in diameter. Peripheral right frontal operculum lesion axial image 103 also show central necrosis, measuring up to 13 mm in diameter. Both are  associated with vasogenic edema. No third lesion identified. Mild to moderate chronic small-vessel changes present within the white matter on the right. Vascular: Major vessels at the base of the brain show flow. Skull and upper cervical spine: Negative Sinuses/Orbits: Clear/normal Other: None IMPRESSION: 1. Two enhancing mass lesions with central necrosis located in the right frontal operculum (13 mm) and right posterior temporal lobe (10 mm) consistent with metastatic disease. Surrounding vasogenic edema. No third lesion  identified. No change since 3 days ago. 2. Mild to moderate chronic small-vessel ischemic changes of the cerebral hemispheric white matter. Electronically Signed   By: Nelson Chimes M.D.   On: 02/05/2020 15:19   MR BRAIN W WO CONTRAST  Result Date: 02/02/2020 CLINICAL DATA:  Seizure.  Abnormal CT head. EXAM: MRI HEAD WITHOUT AND WITH CONTRAST TECHNIQUE: Multiplanar, multiecho pulse sequences of the brain and surrounding structures were obtained without and with intravenous contrast. CONTRAST:  58m GADAVIST GADOBUTROL 1 MMOL/ML IV SOLN COMPARISON:  CT head 02/02/2020 FINDINGS: Brain: Peripheral enhancing mass lesion in the right middle frontal lobe laterally with central necrosis and surrounding edema. The enhancing mass measures up to 15 mm in diameter. This is peripherally located. Second peripherally enhancing mass lesion in the right posterior temporal lobe measures 9 mm in diameter with surrounding vasogenic edema. No third lesion identified. Negative for acute infarct. Scattered small white matter hyperintensities compatible with mild chronic microvascular ischemia. Negative for hydrocephalus or hemorrhage. Vascular: Normal arterial flow voids Skull and upper cervical spine: No focal skeletal lesion. Sinuses/Orbits: Mild mucosal edema paranasal sinuses. Bilateral cataract extraction Other: None IMPRESSION: Two enhancing mass lesions are present in the right cerebral hemisphere with  surrounding edema and central necrosis compatible with metastatic disease. No midline shift Mild chronic microvascular ischemic change in the white matter. No acute infarct. Electronically Signed   By: CFranchot GalloM.D.   On: 02/02/2020 11:22   CT CHEST ABDOMEN PELVIS W CONTRAST  Result Date: 02/02/2020 CLINICAL DATA:  74year old female with abnormal brain MRI and CT today compatible with cerebral metastases. Unknown primary. History of left oophorectomy for nonfunctioning left kidney. EXAM: CT CHEST, ABDOMEN, AND PELVIS WITH CONTRAST TECHNIQUE: Multidetector CT imaging of the chest, abdomen and pelvis was performed following the standard protocol during bolus administration of intravenous contrast. CONTRAST:  1076mOMNIPAQUE IOHEXOL 300 MG/ML  SOLN COMPARISON:  Brain MRI today. [facility] CT Abdomen and Pelvis 07/28/2019 and earlier. FINDINGS: CT CHEST FINDINGS Cardiovascular: Calcified aortic atherosclerosis. Calcified coronary artery atherosclerosis. No cardiomegaly or pericardial effusion. Mediastinum/Nodes: Left upper lung mass inseparable from the mediastinum including the aortic arch (series 5, image 84). But mediastinal lymph nodes remains small, up to 6 mm short axis. Lungs/Pleura: Lobulated up to 8 cm mass of the medial left upper lung inseparable from the anterior lung apex and mediastinum. No associated left pleural effusion. There is contralateral right lower lobe small but spiculated nodule measuring about 11 mm (series 5, image 102). There are also two smaller sub solid nodules in the right upper lobe on series 4, image 48 and image 70. Superimposed scattered bilateral subpleural pulmonary scarring. Musculoskeletal: Stable mild T4 superior endplate compression. No acute or suspicious osseous lesion identified in the chest. CT ABDOMEN PELVIS FINDINGS Hepatobiliary: Hepatic steatosis and mildly nodular liver contour (series 2, image 55) suspicious for cirrhosis. No discrete  liver lesion. Negative gallbladder. Pancreas: Partially atrophied but otherwise negative. Spleen: Negative.  Incidental splenule (normal variant). Adrenals/Urinary Tract: Normal adrenal glands. Surgically absent left kidney as before. Right renal enhancement and contrast excretion is preserved. An anterior midpole exophytic low-density renal mass measures up to 3.8 cm diameter now, versus 2.2 cm in 2017. This has low to intermediate density by CT. On 2018 renal ultrasound this appeared simple and benign. Negative right ureter. Diminutive and unremarkable urinary bladder. Stomach/Bowel: Diverticulosis of the sigmoid colon. Oral contrast has reached the splenic flexure without obstruction. Decompressed ascending and proximal transverse colon. Negative appendix. Decompressed terminal  ileum. No dilated small bowel. Unremarkable stomach. Chronic duodenal diverticulum near the midline appears inconsequential. No free air. No free fluid. Vascular/Lymphatic: Extensive Aortoiliac calcified atherosclerosis. Major arterial structures remain patent. Portal venous system is patent. No lymphadenopathy. Reproductive: Surgically absent uterus. Ovaries are within normal limits on series 2, image 105). Other: No pelvic free fluid. Small umbilical hernia with trace fluid is stable. Musculoskeletal: Advanced lumbar spine degeneration including grade 1 spondylolisthesis in the lower lumbar spine with bulky facet hypertrophy. No acute or suspicious osseous lesion identified. There is a chronic oval mixed density soft tissue mass along the anterior lower sacrum which has only mildly increased in size since 2017, measuring up to 4 cm diameter now on series 2, image 102. This is stable since March. IMPRESSION: 1. Positive for an 8 cm left upper lung mass inseparable from the mediastinum compatible with Bronchogenic Carcinoma. 2. Mediastinal lymph nodes remain normal by size criteria. There are three indeterminate small but spiculated right  lung nodules, the largest measuring 11 mm in the lower lobe. 3. No metastatic disease identified in the abdomen or pelvis. 4. Suspicion of Cirrhosis with hepatic steatosis. 5. Chronic presacral soft tissue mass favored to be benign myelolipoma. Chronic right renal cyst which had a benign ultrasound appearance in 2018. Sigmoid diverticulosis. Duodenal diverticulum. 6. Calcified coronary artery and Aortic atherosclerosis (ICD10-I70.0). Electronically Signed   By: Genevie Ann M.D.   On: 02/02/2020 20:54   CT HEAD CODE STROKE WO CONTRAST  Result Date: 02/02/2020 CLINICAL DATA:  Code stroke. Slurred speech and left-sided numbness. EXAM: CT HEAD WITHOUT CONTRAST TECHNIQUE: Contiguous axial images were obtained from the base of the skull through the vertex without intravenous contrast. COMPARISON:  None. FINDINGS: Brain: 2 areas moderate edema in the right cerebral hemisphere, lateral frontal and temporoparietal. Although both in the MCA distribution these show preserved cortex, more suggestive of vasogenic than cytotoxic edema. No acute hemorrhage, hydrocephalus, or shift. Vascular: No hyperdense vessel. Skull: Normal. Negative for fracture or focal lesion. Sinuses/Orbits: Bilateral cataract resection Other: Critical Value/emergent results were called by telephone at the time of interpretation on 02/02/2020 at 9:16 am to provider Merlyn Lot , who verbally acknowledged these results. ASPECTS [facility] Stroke Program Early CT Score) Not scored given the above IMPRESSION: Two areas of edema in the right cerebral hemisphere. Although both are located in the MCA distribution, these have the appearance of vasogenic edema - more or worrisome for mass lesion than infarct. Recommend brain MRI with contrast. Electronically Signed   By: Monte Fantasia M.D.   On: 02/02/2020 09:19    ASSESSMENT & PLAN:   Brain metastasis ([place]) #Left lung mass/invading the mediastinum highly suspicious for lung cancer.  S/p evaluation with  pulmonary. Awaiting bronchoscopy/biopsy on 10/20.     #Seizure secondary to brain metastases/edema x2 [10 to 15 millimeters]; improvement of symptoms noted.  On dexamethasone 4 mg BID-tolerating well except for elevated blood pressure see below. Continue Keppra. Plan RT SBRT- on 10/13.   # DM- poorly controlled- as per pt 400 at home; post BF- 214 this AM; continue on glipizide 5 mg BID [followed by Dr.Tullo]; currently off Metformin.   #Solitary kidney-normal renal function.  Monitor closely.  # DISPOSITION: # Follow up on 10/25- MD; labs- cbc/CMP;CEA- Dr.B  All questions were answered. The patient knows to call the clinic with any problems, questions or concerns.    [name], MD 02/09/2020 11:03 AM

## 2020-02-09 NOTE — Progress Notes (Signed)
  Oncology Nurse Navigator Documentation  Navigator Location: [facility] Onc (02/09/20 1200)   )Navigator Encounter Type: Follow-up Appt (02/09/20 1200)   Abnormal Finding Date: 02/02/20 (02/09/20 1200)                 Patient Visit Type: MedOnc (02/09/20 1200) Treatment Phase: Abnormal Scans (02/09/20 1200) Barriers/Navigation Needs: Coordination of Care (02/09/20 1200)   Interventions: Coordination of Care (02/09/20 1200)   Coordination of Care: Appts (02/09/20 1200)        Acuity: Level 2-Minimal Needs (1-2 Barriers Identified) (02/09/20 1200)    met with patient during follow up visit with Dr. Rogue Bussing. All questions answered during visit. Reviewed upcoming appts. Contact info given and instructed to call with any further questions or needs. Pt verbalized understanding.     Time Spent with Patient: 30 (02/09/20 1200)

## 2020-02-10 NOTE — Assessment & Plan Note (Signed)
Glipizide added starting at 5 mg bid.  Metformin avoided given anticipation of chemotherapy.  She is reluctant to use insulin due to needle phobia,  But the dexamethasone has created hyperglycemia

## 2020-02-10 NOTE — Assessment & Plan Note (Signed)
Stable by repeat imaging, presumed to be a myolipoma

## 2020-02-10 NOTE — Assessment & Plan Note (Addendum)
Presenting with seizure. she has two lesions,  One 1.5 cm and one < 1 cm  That are presumed metastatic from primary lung cancer .  has been advised not to drive until the masses have been reduced by XRT . Continue Keppra and dexamethasone

## 2020-02-10 NOTE — Assessment & Plan Note (Signed)
Currently taking losartan 50 mg daily. Will increase to 100 mg daily if home readings are > 120/70  Lab Results  Component Value Date   CREATININE 0.89 02/09/2020   Lab Results  Component Value Date   NA 136 02/09/2020   K 3.9 02/09/2020   CL 99 02/09/2020   CO2 26 02/09/2020

## 2020-02-10 NOTE — Progress Notes (Signed)
Kimberly Huff,  I want you to check your blood pressure this weekend and let me know  the reading.  We need to keep it lower, around 120/70 , because of the carotid  aneurysm that was also found.  We can increase the losartan to 100 mg If necessary to achieve this.

## 2020-02-10 NOTE — Assessment & Plan Note (Signed)
Patient is stable post discharge and has no new issues or questions about discharge plans at the visit today for hospital follow up. All labs , imaging studies and progress notes from admission were reviewed with patient today  And medications were reconciled

## 2020-02-10 NOTE — Assessment & Plan Note (Addendum)
6-7 mm.  She was seen by neurosurgery during hospitalization ,  Outpatient follow up recommended. Goal BP 120/70 or below.  she reports compliance with medication regimen  but has an elevated reading today in office.  She is not using NSAIDs daily.   She has been asked to check her  BP  at home and  submit readings for evaluation.   Lab Results  Component Value Date   CREATININE 0.89 02/09/2020   Lab Results  Component Value Date   NA 136 02/09/2020   K 3.9 02/09/2020   CL 99 02/09/2020   CO2 26 02/09/2020

## 2020-02-12 ENCOUNTER — Inpatient Hospital Stay: Admit: 2020-02-12 | Payer: PPO

## 2020-02-12 ENCOUNTER — Ambulatory Visit
Admission: RE | Admit: 2020-02-12 | Discharge: 2020-02-12 | Disposition: A | Discharge status: Home / Self Care | Payer: PPO | Source: Ambulatory Visit | Attending: Radiation Oncology | Admitting: Radiation Oncology

## 2020-02-12 DIAGNOSIS — C3412 Malignant neoplasm of upper lobe, left bronchus or lung: Secondary | ICD-10-CM | POA: Insufficient documentation

## 2020-02-12 DIAGNOSIS — C7931 Secondary malignant neoplasm of brain: Secondary | ICD-10-CM | POA: Insufficient documentation

## 2020-02-12 DIAGNOSIS — Z510 Encounter for antineoplastic radiation therapy: Secondary | ICD-10-CM | POA: Insufficient documentation

## 2020-02-12 DIAGNOSIS — R911 Solitary pulmonary nodule: Secondary | ICD-10-CM | POA: Diagnosis not present

## 2020-02-13 ENCOUNTER — Other Ambulatory Visit: Payer: Self-pay | Admitting: Internal Medicine

## 2020-02-13 ENCOUNTER — Encounter
Admission: RE | Admit: 2020-02-13 | Discharge: 2020-02-13 | Disposition: A | Discharge status: Home / Self Care | Payer: PPO | Source: Ambulatory Visit | Attending: Pulmonary Disease | Admitting: Pulmonary Disease

## 2020-02-13 ENCOUNTER — Other Ambulatory Visit: Payer: Self-pay

## 2020-02-13 DIAGNOSIS — Z510 Encounter for antineoplastic radiation therapy: Secondary | ICD-10-CM | POA: Diagnosis not present

## 2020-02-13 DIAGNOSIS — R911 Solitary pulmonary nodule: Secondary | ICD-10-CM | POA: Diagnosis not present

## 2020-02-13 DIAGNOSIS — C7931 Secondary malignant neoplasm of brain: Secondary | ICD-10-CM | POA: Diagnosis not present

## 2020-02-13 HISTORY — DX: Unspecified convulsions: R56.9

## 2020-02-13 NOTE — Patient Instructions (Addendum)
Your procedure is scheduled on:02-21-20 [facility] Report to Day Surgery on the 2nd floor of the [place]. To find out your arrival time, please call [phone] between 1PM - 3PM on: 02-20-20 TUESDAY  REMEMBER: Instructions that are not followed completely may result in serious medical risk, up to and including death; or upon the discretion of your surgeon and anesthesiologist your surgery may need to be rescheduled.  Do not eat food after midnight the night before surgery.  No gum chewing, lozengers or hard candies.  You may however, drink WATER up to 2 hours before you are scheduled to arrive for your surgery. Do not drink anything within 2 hours of your scheduled arrival time.  Type 1 and Type 2 diabetics should only drink water.Marland Kitchen  TAKE THESE MEDICATIONS THE MORNING OF SURGERY WITH A SIP OF WATER: -KEPPRA (LEVETIRACETAM) -DECADRON (DEXAMETHASONE) -PRILOSEC (OMEPRAZOLE)-take one the night before and one on the morning of surgery - helps to prevent nausea after surgery.   One week prior to surgery: Stop Anti-inflammatories (NSAIDS) such as Advil, Aleve, Ibuprofen, Motrin, Naproxen, Naprosyn and Aspirin based products such as Excedrin, Goodys Powder, BC Powder-OK TO TAKE ULTRAM (TRAMADOL) OF NEEDED  Stop ANY OVER THE COUNTER supplements until after surgery-STOP YOUR CINNAMON NOW-YOU MAY RESUME AFTER YOUR SURGERY (You may continue taking PROBIOTIC AND VITAMIN D.)  No Alcohol for 24 hours before or after surgery.  No Smoking including e-cigarettes for 24 hours prior to surgery.  No chewable tobacco products for at least 6 hours prior to surgery.  No nicotine patches on the day of surgery.  Do not use any "recreational" drugs for at least a week prior to your surgery.  Please be advised that the combination of cocaine and anesthesia may have negative outcomes, up to and including death. If you test positive for cocaine, your surgery will be cancelled.  On the morning of  surgery brush your teeth with toothpaste and water, you may rinse your mouth with mouthwash if you wish. Do not swallow any toothpaste or mouthwash.  Do not wear jewelry, make-up, hairpins, clips or nail polish.  Do not wear lotions, powders, or perfumes.   Do not shave 48 hours prior to surgery.   Contact lenses, hearing aids and dentures may not be worn into surgery.  Do not bring valuables to the hospital. [facility] is not responsible for any missing/lost belongings or valuables.   Notify your doctor if there is any change in your medical condition (cold, fever, infection).  Wear comfortable clothing (specific to your surgery type) to the hospital.  Plan for stool softeners for home use; pain medications have a tendency to cause constipation. You can also help prevent constipation by eating foods high in fiber such as fruits and vegetables and drinking plenty of fluids as your diet allows.  After surgery, you can help prevent lung complications by doing breathing exercises.  Take deep breaths and cough every 1-2 hours. Your doctor may order a device called an Incentive Spirometer to help you take deep breaths. When coughing or sneezing, hold a pillow firmly against your incision with both hands. This is called "splinting." Doing this helps protect your incision. It also decreases belly discomfort.  If you are being admitted to the hospital overnight, leave your suitcase in the car. After surgery it may be brought to your room.  If you are being discharged the day of surgery, you will not be allowed to drive home. You will need a responsible adult (18  years or older) to drive you home and stay with you that night.   If you are taking public transportation, you will need to have a responsible adult (18 years or older) with you. Please confirm with your physician that it is acceptable to use public transportation.   Please call the [place] [facility] Dept. at [phone] if  you have any questions about these instructions.  Visitation Policy:  Patients undergoing a surgery or procedure may have one family member or support person with them as long as that person is not COVID-19 positive or experiencing its symptoms.  That person may remain in the waiting area during the procedure.  Inpatient Visitation Update:   In an effort to ensure the safety of our team members and our patients, we are implementing a change to our visitation policy:  Effective Monday, Aug. 9, at 7 a.m., inpatients will be allowed one support person.  o The support person may change daily.  o The support person must pass our screening, gel in and out, and wear a mask at all times, including in the patient's room.  o Patients must also wear a mask when staff or their support person are in the room.  o Masking is required regardless of vaccination status.  Systemwide, no visitors 17 or younger.

## 2020-02-14 ENCOUNTER — Ambulatory Visit
Admission: RE | Admit: 2020-02-14 | Discharge: 2020-02-14 | Disposition: A | Discharge status: Home / Self Care | Payer: PPO | Source: Ambulatory Visit | Attending: Radiation Oncology | Admitting: Radiation Oncology

## 2020-02-14 DIAGNOSIS — C7931 Secondary malignant neoplasm of brain: Secondary | ICD-10-CM | POA: Diagnosis not present

## 2020-02-14 DIAGNOSIS — Z510 Encounter for antineoplastic radiation therapy: Secondary | ICD-10-CM | POA: Diagnosis not present

## 2020-02-14 DIAGNOSIS — R911 Solitary pulmonary nodule: Secondary | ICD-10-CM | POA: Diagnosis not present

## 2020-02-19 ENCOUNTER — Other Ambulatory Visit: Payer: Self-pay

## 2020-02-19 ENCOUNTER — Other Ambulatory Visit
Admit: 2020-02-19 | Discharge: 2020-02-19 | Disposition: A | Discharge status: Home / Self Care | Payer: PPO | Attending: Pulmonary Disease | Admitting: Pulmonary Disease

## 2020-02-19 ENCOUNTER — Ambulatory Visit
Admission: RE | Admit: 2020-02-19 | Discharge: 2020-02-19 | Disposition: A | Discharge status: Home / Self Care | Payer: PPO | Source: Ambulatory Visit | Attending: Pulmonary Disease | Admitting: Pulmonary Disease

## 2020-02-19 DIAGNOSIS — R0600 Dyspnea, unspecified: Secondary | ICD-10-CM | POA: Diagnosis not present

## 2020-02-19 DIAGNOSIS — Z01818 Encounter for other preprocedural examination: Secondary | ICD-10-CM | POA: Diagnosis not present

## 2020-02-19 DIAGNOSIS — Z20822 Contact with and (suspected) exposure to covid-19: Secondary | ICD-10-CM | POA: Insufficient documentation

## 2020-02-19 DIAGNOSIS — E119 Type 2 diabetes mellitus without complications: Secondary | ICD-10-CM | POA: Diagnosis not present

## 2020-02-19 DIAGNOSIS — G40909 Epilepsy, unspecified, not intractable, without status epilepticus: Secondary | ICD-10-CM | POA: Diagnosis not present

## 2020-02-19 DIAGNOSIS — I2510 Atherosclerotic heart disease of native coronary artery without angina pectoris: Secondary | ICD-10-CM | POA: Diagnosis not present

## 2020-02-19 DIAGNOSIS — N261 Atrophy of kidney (terminal): Secondary | ICD-10-CM | POA: Diagnosis not present

## 2020-02-19 DIAGNOSIS — K219 Gastro-esophageal reflux disease without esophagitis: Secondary | ICD-10-CM | POA: Diagnosis not present

## 2020-02-19 DIAGNOSIS — I700 Atherosclerosis of aorta: Secondary | ICD-10-CM | POA: Insufficient documentation

## 2020-02-19 DIAGNOSIS — R918 Other nonspecific abnormal finding of lung field: Secondary | ICD-10-CM | POA: Insufficient documentation

## 2020-02-19 DIAGNOSIS — R531 Weakness: Secondary | ICD-10-CM | POA: Diagnosis not present

## 2020-02-19 DIAGNOSIS — I10 Essential (primary) hypertension: Secondary | ICD-10-CM | POA: Diagnosis not present

## 2020-02-19 DIAGNOSIS — C3490 Malignant neoplasm of unspecified part of unspecified bronchus or lung: Secondary | ICD-10-CM | POA: Diagnosis not present

## 2020-02-19 DIAGNOSIS — E782 Mixed hyperlipidemia: Secondary | ICD-10-CM | POA: Diagnosis not present

## 2020-02-19 DIAGNOSIS — I219 Acute myocardial infarction, unspecified: Secondary | ICD-10-CM | POA: Diagnosis not present

## 2020-02-19 LAB — SARS CORONAVIRUS 2 (TAT 6-24 HRS): SARS Coronavirus 2: NEGATIVE

## 2020-02-19 LAB — CBC
HCT: 41.2 % (ref 36.0–46.0)
Hemoglobin: 13 g/dL (ref 12.0–15.0)
MCH: 26 pg (ref 26.0–34.0)
MCHC: 31.6 g/dL (ref 30.0–36.0)
MCV: 82.4 fL (ref 80.0–100.0)
Platelets: 165 10*3/uL (ref 150–400)
RBC: 5 MIL/uL (ref 3.87–5.11)
RDW: 15.1 % (ref 11.5–15.5)
WBC: 7.6 10*3/uL (ref 4.0–10.5)
nRBC: 0 % (ref 0.0–0.2)

## 2020-02-19 LAB — APTT: aPTT: <24 seconds — ABNORMAL LOW (ref 24–36)

## 2020-02-19 LAB — PROTIME-INR
INR: 1.1 (ref 0.8–1.2)
Prothrombin Time: 13.7 seconds (ref 11.4–15.2)

## 2020-02-19 NOTE — Progress Notes (Signed)
[facility] Perioperative Services  Pre-Admission/Anesthesia Testing Clinical Review  Date: 02/20/20  Patient Demographics:  Name: Kimberly Huff DOB:   [birth year] MRN:   [id number]  Planned Surgical Procedure(s):    Case: [id number] Date/Time: 02/21/20 1245   Procedures:      VIDEO BRONCHOSCOPY WITH ENDOBRONCHIAL ULTRASOUND (N/A )     VIDEO BRONCHOSCOPY WITH ENDOBRONCHIAL NAVIGATION (N/A )   Anesthesia type: General   Pre-op diagnosis: Lung Mass R91.8   Location: [place] PROCEDURE RM 01 / [place] ORS FOR ANESTHESIA GROUP   Surgeons: Ottie Glazier, MD     NOTE: Available PAT nursing documentation and vital signs have been reviewed. Clinical nursing staff has updated patient's PMH/PSHx, current medication list, and drug allergies/intolerances to ensure comprehensive history available to assist in medical decision making as it pertains to the aforementioned surgical procedure and anticipated anesthetic course.   Clinical Discussion:  Kimberly Huff is a 74 y.o. female who is submitted for pre-surgical anesthesia review and clearance prior to her undergoing the above procedure.Patient is a Former Smoker (19 pack years; quit 09/1998). 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  Patient admitted to [facility] from 02/02/2020 through 02/05/2020; notes related to hospital course reviewed. Patient presented to the ED with AMS and reports of difficulty speaking and "twitching" of the LEFT side of her face. Patient reports that she felt like she could not keep her mouth closed. Symptoms started the previous day and lasted only a short time. With recurrence of the  aforementioned symptoms the next day, patient decided to present for evaluation. Patient denied headaches, or focal weakness/numbness. CT of the head revealed two areas of concern in the brain; MRI recommended. MRI of the brain revealed two enhancing masses mesions in the RIGHT cerebral hemisphere with surrounding edema and central necrosis, which was felt to be compatible with metastatic disease; there was no midline shift noted. She was started on oral levetiracetam for seizure-like activity. Patient was admitted to medicine with plans for oncology consult.   Patient was seen in consult on 02/02/2020 by Dr. Charlaine Dalton (oncology). High dose steroids were recommended to help with the cerebral edema noted on imaging. Oncology requesting CT imaging of the chest, abdomen, and pelvis to further assess for evidence for other potential areas associated with the brain metastases. Imaging of the CAP revealed an 8 cm LUL pulmonary mass that was felt to be compatible with primary bronchogenic carcinoma. There was no mediastinal LAD. There were three spiculated lung nodules, with the largest measuring 11 mm, identified in the RLL (see full interpretation of imaging below). Patient was seen in consult by both neurology and neurosurgery during her admission. Resection of brain mets was not recommended. She was referred to radiation oncology for consideration of radiation therapy. SRS was recommended; CT simulation scheduled. Patient also seen by PCCM during admission. Patient planned for bronchoscopy for tissue sampling needed for diagnosis and staging on 02/21/2020. Dr. Lanney Gins (PCCM) will be performed the bronchoscopy. He noted some some concerns with her ECG and has asked that she see cardiology in consult for pre-procedural clearance.   Patient is followed by cardiology Clayborn Bigness, MD). She was last seen in the cardiology clinic on 02/19/2020; note pending dictation and not  available fore review at this time.  Spoke directly with Dr. Clayborn Bigness who advised that "this patient is optimized for surgery and may proceed with the planned procedural course with a LOW to MODERATE risk stratification". This patient is not on daily anticoagulation therapy.  She reports previous perioperative complications with anesthesia. Patient has a past history of (+) PONV.  She underwent a general anesthetic course at [facility] ([facility]) in 09/2019 with no documented complications.   Vitals with BMI 02/09/2020 02/08/2020 02/05/2020  Height 5' 3"  5' 3"  -  Weight 181 lbs 181 lbs 3 oz -  BMI 67.54 49.20 -  Systolic 100 712 197  Diastolic 57 90 51  Pulse 54 70 55    Providers/Specialists:   NOTE: Primary physician provider listed below. Patient may have been seen by APP or partner within same practice.   PROVIDER ROLE LAST Charlynn Grimes, MD Pulmonary (Surgeon) 02/18/2020  [name], MD Primary Care Provider 02/08/2020  Katrine Coho, MD Cardiology 02/19/2020  Charlaine Dalton, MD Oncology 02/09/2020   Allergies:  Patient has no known allergies.  Current Home Medications:   No current facility-administered medications for this encounter.   . Cinnamon 500 MG capsule  . dexamethasone (DECADRON) 4 MG tablet  . furosemide (LASIX) 40 MG tablet  . glipiZIDE (GLUCOTROL) 5 MG tablet  . levETIRAcetam (KEPPRA) 500 MG tablet  . loperamide (IMODIUM A-D) 2 MG tablet  . losartan (COZAAR) 50 MG tablet  . metFORMIN (GLUCOPHAGE) 850 MG tablet  . omeprazole (PRILOSEC) 40 MG capsule  . ONETOUCH ULTRA test strip  . Probiotic Product (PROBIOTIC DAILY PO)  . rosuvastatin (CRESTOR) 10 MG tablet  . senna-docusate (SENOKOT-S) 8.6-50 MG tablet  . traMADol (ULTRAM) 50 MG tablet  . Vitamin D, Ergocalciferol, (DRISDOL) 1.25 MG (50000 UNIT) CAPS capsule   History:   Past Medical History:  Diagnosis Date  . Allergy   . Atrophic kidney    left  . Blood clot in vein 45 yrs ago   inabdoment after surgery    . Blood transfusion without reported diagnosis   . Cataract   . Complication of anesthesia   . Coronary artery disease   . Coronary atherosclerosis   . Diabetes mellitus   . Diverticulosis 2005   Dr Jamal Collin  . Dyspnea    with exertion  . External hemorrhoids without mention of complication 5883  . GERD (gastroesophageal reflux disease)   . H/O cardiac catheterization 2005  . Heart murmur 2012   found by Dr. Jamal Collin  . Hyperlipidemia   . Hypertension   . Kidney stone   . Lesion of bladder   . Myocardial infarction ([place]) 2000  . Neuromuscular disorder ([place])    "achy muscles"  . Obesity   . Osteoarthritis   . PONV (postoperative nausea and vomiting)   . Recurrent UTI   . Seizure ([place])   . Spinal stenosis of cervical region   . Steatohepatitis 05/30/10   Brunt Stage 3, non alcoholic cirrhosis of the liver  . Type II or unspecified type diabetes mellitus without mention of complication, uncontrolled    Patient does takes Metformin and recommended to stop for 48 hours.  Marland Kitchen Ulcer   . Unspecified essential hypertension 2014   Past Surgical History:  Procedure Laterality Date  . ABDOMINAL HYSTERECTOMY  1982   complete  . BREAST CYST ASPIRATION Right 1990's   benign  . BREAST MASS EXCISION     benign  . CARDIAC CATHETERIZATION  2000   cardiac stent  . cardiac stents  2000  . CATARACT EXTRACTION     both eyes  . COLONOSCOPY  2004, 2015   Dr. Jamal Collin, Dr. Olevia Perches  . CYSTOSCOPY W/ RETROGRADES Left 11/12/2015   Procedure: CYSTOSCOPY WITH RETROGRADE PYELOGRAM;  Surgeon: Cleon Gustin, MD;  Location: [facility] ORS;  Service: Urology;  Laterality: Left;  . CYSTOSCOPY W/ URETERAL STENT PLACEMENT Left 11/12/2015   Procedure: CYSTOSCOPY WITH STENT REPLACEMENT;  Surgeon: Cleon Gustin, MD;  Location: [facility] ORS;  Service: Urology;  Laterality: Left;  . CYSTOSCOPY WITH BIOPSY N/A 09/26/2019   Procedure: CYSTOSCOPY WITH BIOPSY AND FULGURATION;  Surgeon: Cleon Gustin, MD;  Location:  [facility];  Service: Urology;  Laterality: N/A;  . CYSTOSCOPY WITH URETEROSCOPY AND STENT PLACEMENT Left 10/15/2015   Procedure: CYSTOSCOPY WITH URETEROSCOPY AND STENT PLACEMENT;  Surgeon: Cleon Gustin, MD;  Location: [facility] ORS;  Service: Urology;  Laterality: Left;  . EYE SURGERY Bilateral 2012   cataract  . FOOT SURGERY  2008  . PTCA  2000   stent x 1  . ROBOTIC ASSITED PARTIAL NEPHRECTOMY Left 06/02/2019   Procedure: XI ROBOTIC ASSITED NEPHRECTOMY;  Surgeon: Cleon Gustin, MD;  Location: [facility] ORS;  Service: Urology;  Laterality: Left;  . SALPINGOOPHORECTOMY    . TONSILLECTOMY    . UPPER GI ENDOSCOPY    . URETEROSCOPY WITH HOLMIUM LASER LITHOTRIPSY Left 11/12/2015   Procedure: URETEROSCOPY WITH HOLMIUM LASER LITHOTRIPSY;  Surgeon: Cleon Gustin, MD;  Location: [facility] ORS;  Service: Urology;  Laterality: Left;  . URETEROTOMY  1960/1969   x 2 during childhood  . VAGINAL HYSTERECTOMY     Family History  Problem Relation Age of Onset  . Lung cancer Father   . Heart disease Father   . Diverticulosis Mother   . Diabetes Mother   . Stroke Mother 80  . Autoimmune disease Daughter   . Thyroid disease Daughter   . Thyroid disease Sister   . Stomach cancer Maternal Aunt   . Breast cancer Maternal Aunt   . Colon cancer Neg Hx   . Esophageal cancer Neg Hx   . Rectal cancer Neg Hx    Social History   Tobacco Use  . Smoking status: Former Smoker    Packs/day: 0.50    Years: 38.00    Pack years: 19.00    Types: Cigarettes    Quit date: 09/15/1998    Years since quitting: 21.4  . Smokeless tobacco: Never Used  Vaping Use  . Vaping Use: Never used  Substance Use Topics  . Alcohol use: Not Currently  . Drug use: No    Pertinent Clinical Results:  LABS: Labs reviewed: Acceptable for surgery.  Hospital Outpatient Visit on 02/19/2020  Component Date Value Ref Range Status  . SARS Coronavirus 2 02/19/2020 NEGATIVE  NEGATIVE Final   Comment:  (NOTE) SARS-CoV-2 target nucleic acids are NOT DETECTED.  The SARS-CoV-2 RNA is generally detectable in upper and lower respiratory specimens during the acute phase of infection. Negative results do not preclude SARS-CoV-2 infection, do not rule out co-infections with other pathogens, and should not be used as the sole basis for treatment or other patient management decisions. Negative results must be combined with clinical observations, patient history, and epidemiological information. The expected result is Negative.  Fact Sheet for Patients: SugarRoll.be  Fact Sheet for Healthcare Providers: https://www.woods-mathews.com/  This test is not yet approved or cleared by the [place] FDA and  has been authorized for detection and/or diagnosis of SARS-CoV-2 by FDA under an Emergency Use Authorization (EUA). This EUA will remain  in effect (meaning this test can be used) for the duration of the COVID-19 declaration under Se                          ction 564(b)(1) of the Act, 21 U.S.C. section 360bbb-3(b)(1), unless the authorization is terminated or revoked sooner.  Performed at [facility], [place] [address], [place], [place] [address]   . aPTT 02/19/2020 <24* 24 - 36 seconds Final   Performed at [facility], [address] [place], [place] [address]  . WBC 02/19/2020 7.6  4.0 - 10.5 K/uL Final  . RBC 02/19/2020 5.00  3.87 - 5.11 MIL/uL Final  . Hemoglobin 02/19/2020 13.0  12.0 - 15.0 g/dL Final  . HCT 02/19/2020 41.2  36 - 46 % Final  . MCV 02/19/2020 82.4  80.0 - 100.0 fL Final  . MCH 02/19/2020 26.0  26.0 - 34.0 pg Final  . MCHC 02/19/2020 31.6  30.0 - 36.0 g/dL Final  . RDW 02/19/2020 15.1  11.5 - 15.5 % Final  . Platelets 02/19/2020 165  150 - 400 K/uL Final  . nRBC 02/19/2020 0.0  0.0 - 0.2 % Final   Performed at [facility], [address] [place], [place] [address]  . Prothrombin Time 02/19/2020  13.7  11.4 - 15.2 seconds Final  . INR 02/19/2020 1.1  0.8 - 1.2 Final   Comment: (NOTE) INR goal varies based on device and disease states. Performed at [facility], [address] [place], [place] [address]     ECG: Date: 02/02/2020 Time ECG obtained: 0923 AM Rate: 96 bpm Rhythm: normal sinus Axis (leads I and aVF): Right axis deviation Intervals: QRS 93 ms. QTc 449 ms. ST segment and T wave changes: Abnormal T wave in lateral leads Comparison: Similar to previous tracing obtained on 05/30/2019   IMAGING / PROCEDURES: CT CHEST WO CONTRAST done on 02/19/2020 1. Left upper lobe lobulated mass abutting the mediastinal pleural measures approximately 6.6 x 3.4 cm (previously 7.5 x 4.4 cm measured in similar plane).  2. 5 mm nodule in the left upper lobe (55/3) appears similar to prior CT.  3. There is a 13 mm nodule in the right lower lobe along the fissure, similar to prior CT.  4. A cluster of nodular density in the right upper lobe measure approximately 1 cm in combined dimension (previously 1.2 cm).  5. No consolidative changes.  6. There is no pleural effusion pneumothorax. 7. The central airways are patent. 8. Slight irregularity of the liver contour, likely early changes of cirrhosis.  9. Trace free fluid adjacent to the spleen. 10. Osteopenia with degenerative changes of the spine. 11. No acute osseous pathology.  MRI BRAIN W/WO CONTRAST done on 02/05/2020 1. Two enhancing mass lesions with central necrosis located in the right frontal operculum (13 mm) and right posterior temporal lobe (10 mm) consistent with metastatic disease.  2. Surrounding vasogenic edema. No third lesion identified. No change since 3 days ago. 3. Mild to moderate chronic small-vessel ischemic changes of the cerebral hemispheric white matter.  MRA NECK W/WO CONTRAST done on 02/02/2020 1. Negative MRA neck. 2. Aneurysm right cavernous carotid.  Impression and Plan:  Kimberly Huff has  been referred for pre-anesthesia review and clearance prior to her undergoing the planned anesthetic and procedural  courses. Available labs, pertinent testing, and imaging results were personally reviewed by me. This patient has been appropriately cleared by cardiology.   Based on clinical review performed today (02/20/20), barring any significant acute changes in the patient's overall condition, it is anticipated that she will be able to proceed with the planned surgical intervention. Any acute changes in clinical condition may necessitate her procedure being postponed and/or cancelled. Pre-surgical instructions were reviewed with the patient during her PAT appointment and questions were fielded by PAT clinical staff.  Honor Loh, MSN, APRN, FNP-C, CEN [facility]  Peri-operative Services Nurse Practitioner Phone: [phone] 02/20/20 1:14 PM  NOTE: This note has been prepared using Dragon dictation software. Despite my best ability to proofread, there is always the potential that unintentional transcriptional errors may still occur from this process.

## 2020-02-20 ENCOUNTER — Encounter: Payer: Self-pay | Admitting: Urology

## 2020-02-20 ENCOUNTER — Other Ambulatory Visit: Payer: Self-pay

## 2020-02-20 MED ORDER — SODIUM CHLORIDE 0.9 % IV SOLN: INTRAVENOUS | Status: DC

## 2020-02-20 MED ORDER — ORAL CARE MOUTH RINSE: 15.0000 mL | Freq: Once | OROMUCOSAL | Status: AC

## 2020-02-20 MED ORDER — CHLORHEXIDINE GLUCONATE 0.12 % MT SOLN
15.0000 mL | Freq: Once | OROMUCOSAL | Status: AC
  Administered 2020-02-21: 15 mL via OROMUCOSAL

## 2020-02-20 MED ORDER — GLIPIZIDE 10 MG PO TABS: 10.0000 mg | ORAL_TABLET | Freq: Two times a day (BID) | ORAL | 0 refills | Status: DC

## 2020-02-21 ENCOUNTER — Inpatient Hospital Stay: Payer: PPO | Attending: Internal Medicine | Admitting: Hospice and Palliative Medicine

## 2020-02-21 ENCOUNTER — Ambulatory Visit: Payer: PPO | Admitting: Urgent Care

## 2020-02-21 ENCOUNTER — Encounter
Admission: RE | Disposition: A | Discharge status: Home / Self Care | Payer: Self-pay | Source: Home / Self Care | Attending: Pulmonary Disease

## 2020-02-21 ENCOUNTER — Other Ambulatory Visit: Payer: Self-pay

## 2020-02-21 ENCOUNTER — Ambulatory Visit
Admission: RE | Admit: 2020-02-21 | Discharge: 2020-02-21 | Disposition: A | Discharge status: Home / Self Care | Payer: PPO | Attending: Pulmonary Disease | Admitting: Pulmonary Disease

## 2020-02-21 ENCOUNTER — Ambulatory Visit: Payer: PPO

## 2020-02-21 DIAGNOSIS — K7581 Nonalcoholic steatohepatitis (NASH): Secondary | ICD-10-CM | POA: Diagnosis not present

## 2020-02-21 DIAGNOSIS — I4891 Unspecified atrial fibrillation: Secondary | ICD-10-CM | POA: Diagnosis not present

## 2020-02-21 DIAGNOSIS — Z87891 Personal history of nicotine dependence: Secondary | ICD-10-CM | POA: Diagnosis not present

## 2020-02-21 DIAGNOSIS — I2510 Atherosclerotic heart disease of native coronary artery without angina pectoris: Secondary | ICD-10-CM | POA: Diagnosis not present

## 2020-02-21 DIAGNOSIS — I10 Essential (primary) hypertension: Secondary | ICD-10-CM | POA: Insufficient documentation

## 2020-02-21 DIAGNOSIS — Z905 Acquired absence of kidney: Secondary | ICD-10-CM | POA: Insufficient documentation

## 2020-02-21 DIAGNOSIS — E119 Type 2 diabetes mellitus without complications: Secondary | ICD-10-CM | POA: Diagnosis not present

## 2020-02-21 DIAGNOSIS — I252 Old myocardial infarction: Secondary | ICD-10-CM | POA: Insufficient documentation

## 2020-02-21 DIAGNOSIS — C3412 Malignant neoplasm of upper lobe, left bronchus or lung: Secondary | ICD-10-CM | POA: Insufficient documentation

## 2020-02-21 DIAGNOSIS — Z85841 Personal history of malignant neoplasm of brain: Secondary | ICD-10-CM | POA: Insufficient documentation

## 2020-02-21 DIAGNOSIS — R918 Other nonspecific abnormal finding of lung field: Secondary | ICD-10-CM | POA: Diagnosis not present

## 2020-02-21 DIAGNOSIS — C3402 Malignant neoplasm of left main bronchus: Secondary | ICD-10-CM | POA: Diagnosis not present

## 2020-02-21 DIAGNOSIS — Z7689 Persons encountering health services in other specified circumstances: Secondary | ICD-10-CM | POA: Diagnosis not present

## 2020-02-21 DIAGNOSIS — E1129 Type 2 diabetes mellitus with other diabetic kidney complication: Secondary | ICD-10-CM | POA: Diagnosis not present

## 2020-02-21 DIAGNOSIS — E785 Hyperlipidemia, unspecified: Secondary | ICD-10-CM | POA: Insufficient documentation

## 2020-02-21 DIAGNOSIS — J984 Other disorders of lung: Secondary | ICD-10-CM | POA: Diagnosis present

## 2020-02-21 DIAGNOSIS — C7931 Secondary malignant neoplasm of brain: Secondary | ICD-10-CM

## 2020-02-21 HISTORY — PX: VIDEO BRONCHOSCOPY WITH ENDOBRONCHIAL ULTRASOUND: SHX6177

## 2020-02-21 HISTORY — PX: VIDEO BRONCHOSCOPY WITH ENDOBRONCHIAL NAVIGATION: SHX6175

## 2020-02-21 LAB — GLUCOSE, CAPILLARY
Glucose-Capillary: 274 mg/dL — ABNORMAL HIGH (ref 70–99)
Glucose-Capillary: 245 mg/dL — ABNORMAL HIGH (ref 70–99)

## 2020-02-21 SURGERY — BRONCHOSCOPY, WITH EBUS
Anesthesia: General

## 2020-02-21 MED ORDER — SUGAMMADEX SODIUM 500 MG/5ML IV SOLN
INTRAVENOUS | Status: AC
  Filled 2020-02-21: qty 5

## 2020-02-21 MED ORDER — PROPOFOL 10 MG/ML IV BOLUS
INTRAVENOUS | Status: AC
  Filled 2020-02-21: qty 20

## 2020-02-21 MED ORDER — ROCURONIUM BROMIDE 100 MG/10ML IV SOLN
INTRAVENOUS | Status: DC | PRN
  Administered 2020-02-21: 50 mg via INTRAVENOUS

## 2020-02-21 MED ORDER — FENTANYL CITRATE (PF) 100 MCG/2ML IJ SOLN
INTRAMUSCULAR | Status: AC
  Filled 2020-02-21: qty 2

## 2020-02-21 MED ORDER — ONDANSETRON HCL 4 MG/2ML IJ SOLN
INTRAMUSCULAR | Status: AC
  Filled 2020-02-21: qty 2

## 2020-02-21 MED ORDER — FENTANYL CITRATE (PF) 100 MCG/2ML IJ SOLN
INTRAMUSCULAR | Status: DC | PRN
  Administered 2020-02-21: 50 ug via INTRAVENOUS

## 2020-02-21 MED ORDER — ONDANSETRON HCL 4 MG/2ML IJ SOLN
INTRAMUSCULAR | Status: DC | PRN
  Administered 2020-02-21: 4 mg via INTRAVENOUS

## 2020-02-21 MED ORDER — CHLORHEXIDINE GLUCONATE 0.12 % MT SOLN
OROMUCOSAL | Status: AC
  Filled 2020-02-21: qty 15

## 2020-02-21 MED ORDER — PROPOFOL 10 MG/ML IV BOLUS
INTRAVENOUS | Status: DC | PRN
  Administered 2020-02-21: 130 mg via INTRAVENOUS

## 2020-02-21 MED ORDER — SUGAMMADEX SODIUM 200 MG/2ML IV SOLN
INTRAVENOUS | Status: DC | PRN
  Administered 2020-02-21: 180 mg via INTRAVENOUS

## 2020-02-21 MED ORDER — LIDOCAINE HCL (CARDIAC) PF 100 MG/5ML IV SOSY
PREFILLED_SYRINGE | INTRAVENOUS | Status: DC | PRN
  Administered 2020-02-21: 60 mg via INTRAVENOUS

## 2020-02-21 NOTE — Procedures (Signed)
ELECTROMAGNETIC NAVIGATIONAL BRONCHOSCOPY PROCEDURE NOTE  FIBEROPTIC BRONCHOSCOPY WITH BRONCHOALVEOLAR LAVAGE PROCEDURE NOTE      Flexible bronchoscopy was performed  by : Lanney Gins MD  assistance by : 1)Repiratory therapist  and 2)[employer] staff and 3) Anesthesia team and 4) Flouroscopy team and 5) [employer] supporting staff   Indication for the procedure was :  Pre-procedural H&P. The following assessment was performed on the day of the procedure prior to initiating sedation History:  Chest pain n Dyspnea y Hemoptysis n Cough y Fever n Other pertinent items n  Examination Vital signs -reviewed as per nursing documentation today Cardiac    Murmurs: n  Rubs : n  Gallop: n Lungs Wheezing: n Rales : n Rhonchi :y  Other pertinent findings: SOB/hypoxemia due to chronic lung disease   Pre-procedural assessment for Procedural Sedation included: Depth of sedation: As per anesthesia team  ASA Classification:  2 Mallampati airway assessment: 3    Medication list reviewed: y  The patient's interval history was taken and revealed: no new complaints The pre- procedure physical examination revealed: No new findings Refer to prior clinic note for details.  Informed Consent: Informed consent was obtained from:  patient after explanation of procedure and risks, benefits, as well as alternative procedures available.  Explanation of level of sedation and possible transfusion was also provided.    Procedural Preparation: Time out was performed and patient was identified by name and birthdate and procedure to be performed and side for sampling, if any, was specified. Pt was intubated by anesthesia.  The patient was appropriately draped.   Fiberoptic bronchoscopy with airway inspection and BAL Procedure findings:  Bronchoscope was inserted via ETT  without difficulty.  Posterior oropharynx, epiglottis, arytenoids, false cords and vocal cords were not visualized as  these were bypassed by endotracheal tube. The distal trachea was normal in circumference and appearance without mucosal, cartilaginous or branching abnormalities.  The main carina was mildly splayed . All right and left lobar airways were visualized to the Subsegmental level.  Sub- sub segmental carinae were identified in all the distal airways.   Secretions were visible in the following airways and appeared to be clear.  The mucosa was : friable at RUL  Airways were notable for:        exophytic lesions :n       extrinsic compression in the following distributions: n.       Friable mucosa: y       Neurosurgeon /pigmentation: n     Post procedure Diagnosis:   Lung cancer     Electromagnetic Navigational Bronchoscopy Procedure Findings:  After appropriate CT-guided planning ENB scope was advanced via endotracheal tube and LG was advanced for registration.  Post appropriate planning and registration peripheral navigation was used to visualize target lesion.    Cytobrush x 3 - atypical cells found Surgical forceps X6 - lesional malignacy found  Post procedure diagnosis: Lung cancer      Specimens obtained included:                   Cytology brushes : LUL - for cytology   Broncho-alveolar lavage site:LUL   sent for cytology                             98m volume infused 3656mvolume returned with bloody  appearance  Fluoroscopy Used: 1.56m80m        Pictorial documentation attached: no  Epinephrine ZERO ml was used topically  The bronchoscopy was terminated due to completion of the planned procedure and the bronchoscope was removed.   Total dosage of Lidocaine was ZEROmg Total fluoroscopy time was 1.1 minutes    Estimated Blood loss: 5cc.    Disposition: Home with family   Follow up with Dr. Lanney Gins in 5 days for result discussion.     Ottie Glazier MD  [facility] Division of Pulmonary & Critical Care  Medicine

## 2020-02-21 NOTE — Progress Notes (Signed)
Multidisciplinary Oncology Council Documentation  Kimberly Huff was presented by our [facility] on 02/21/2020, which included representatives from:  . Palliative Care . Dietitian . Physical/Occupational Therapist . Speech Therapist . Survivorship Nurse . Nurse Navigator . Research RN . Genetics  Kimberly Huff currently presents with history of metastatic lung cancer.  We reviewed previous medical and familial history, history of present illness, and recent lab results along with all available histopathologic and imaging studies. The [place] considered available treatment options and made the following recommendations/referrals: Orders Placed This Encounter  Procedures  . Ambulatory Referral to Palliative Care  . Amb Referral to Nutrition and Diabetic Education  . Ambulatory referral to Oncology Navigator    The [facility] is a meeting of clinicians from various specialty areas who evaluate and discuss patients for whom a multidisciplinary approach is being considered. Final determinations in the plan of care are those of the provider(s).   Today's extended care, comprehensive team conference, Kimberly Huff was not present for the discussion and was not examined.

## 2020-02-21 NOTE — Transfer of Care (Signed)
Immediate Anesthesia Transfer of Care Note  Patient: Kimberly Huff  Procedure(s) Performed: VIDEO BRONCHOSCOPY WITH ENDOBRONCHIAL ULTRASOUND (N/A ) VIDEO BRONCHOSCOPY WITH ENDOBRONCHIAL NAVIGATION (N/A )  Patient Location: PACU  Anesthesia Type:General  Level of Consciousness: drowsy and patient cooperative  Airway & Oxygen Therapy: Patient Spontanous Breathing and Patient connected to face mask oxygen  Post-op Assessment: Report given to RN and Post -op Vital signs reviewed and stable  Post vital signs: Reviewed and stable  Last Vitals:  Vitals Value Taken Time  BP 147/76 02/21/20 1421  Temp 36 C 02/21/20 1419  Pulse 63 02/21/20 1423  Resp 15 02/21/20 1423  SpO2 100 % 02/21/20 1423  Vitals shown include unvalidated device data.  Last Pain:  Vitals:   02/21/20 1419  TempSrc:   PainSc: 0-No pain         Complications: No complications documented.

## 2020-02-21 NOTE — Anesthesia Procedure Notes (Signed)
Procedure Name: Intubation Date/Time: 02/21/2020 1:18 PM Performed by: Jonna Clark, CRNA Pre-anesthesia Checklist: Patient identified, Patient being monitored, Timeout performed, Emergency Drugs available and Suction available Patient Re-evaluated:Patient Re-evaluated prior to induction Oxygen Delivery Method: Circle system utilized Preoxygenation: Pre-oxygenation with 100% oxygen Induction Type: IV induction Ventilation: Mask ventilation without difficulty Laryngoscope Size: 3 and McGraph Grade View: Grade I Tube type: Oral Tube size: 8.5 mm Number of attempts: 1 Airway Equipment and Method: Stylet Placement Confirmation: ETT inserted through vocal cords under direct vision,  positive ETCO2 and breath sounds checked- equal and bilateral Secured at: 20 cm Tube secured with: Tape Dental Injury: Teeth and Oropharynx as per pre-operative assessment

## 2020-02-21 NOTE — Anesthesia Postprocedure Evaluation (Signed)
Anesthesia Post Note  Patient: Kimberly Huff  Procedure(s) Performed: VIDEO BRONCHOSCOPY WITH ENDOBRONCHIAL ULTRASOUND (N/A ) VIDEO BRONCHOSCOPY WITH ENDOBRONCHIAL NAVIGATION (N/A )  Patient location during evaluation: PACU Anesthesia Type: General Level of consciousness: awake and alert and oriented Pain management: pain level controlled Vital Signs Assessment: post-procedure vital signs reviewed and stable Respiratory status: spontaneous breathing, nonlabored ventilation and respiratory function stable Cardiovascular status: blood pressure returned to baseline and stable Postop Assessment: no signs of nausea or vomiting Anesthetic complications: no   No complications documented.   Last Vitals:  Vitals:   02/21/20 1436 02/21/20 1451  BP: 140/75 134/63  Pulse: 69 (!) 56  Resp: 13 13  Temp:  (!) 36.3 C  SpO2: 95% 95%    Last Pain:  Vitals:   02/21/20 1451  TempSrc:   PainSc: 0-No pain                 [name]

## 2020-02-21 NOTE — Discharge Instructions (Signed)
AMBULATORY SURGERY  DISCHARGE INSTRUCTIONS   1) The drugs that you were given will stay in your system until tomorrow so for the next 24 hours you should not:  A) Drive an automobile B) Make any legal decisions C) Drink any alcoholic beverage   2) You may resume regular meals tomorrow.  Today it is better to start with liquids and gradually work up to solid foods.  You may eat anything you prefer, but it is better to start with liquids, then soup and crackers, and gradually work up to solid foods.   3) Please notify your doctor immediately if you have any unusual bleeding, trouble breathing, redness and pain at the surgery site, drainage, fever, or pain not relieved by medication.    4) Additional Instructions:may cough up blood, ok to resume home meds, should know results in 7 days        Please contact your physician with any problems or Same Day Surgery at [phone], Monday through Friday 6 am to 4 pm, or [place] at [facility] number at [phone].

## 2020-02-21 NOTE — H&P (Signed)
Pulmonary Medicine          Date: 02/21/2020,   MRN# [id number] Kimberly Huff [birth year]     AdmissionWeight: 82.1 kg                 CurrentWeight: 82.1 kg      CHIEF COMPLAINT:   Left lung mass   HISTORY OF PRESENT ILLNESS   [name] is 73 y.o. female who was referred to [facility] Pulmonary clinic due to lung mass. Patient was noted to have appx >3cm left upper lobe lung mass. She is on blood thinners for atrial fibrillation. She is also on aspirin. She has stopped eliquis and aspirin. She is excited to go to beach coming for vacation. She is breathing well. She is not coughing or in any pain. She has voice hoarseness after having abrasive frustrating conversation on phone. We discussed findings of lung mass on CT chest and plan for biopsy. Patient is agreeable and wishes to proceed. Patient stopped antiplatelet and blood thinners >7 days ago and had cardiac clearance from cardio. Patient is here today for procedure specifically bronchoscopic biopsy of LUL mass.     PAST MEDICAL HISTORY   Past Medical History:  Diagnosis Date  . Allergy   . Atrophic kidney    left  . Blood clot in vein 45 yrs ago   inabdoment after surgery  . Blood transfusion without reported diagnosis   . Cataract   . Complication of anesthesia   . Coronary artery disease   . Coronary atherosclerosis   . Diabetes mellitus   . Diverticulosis 2005   Dr Jamal Collin  . Dyspnea    with exertion  . External hemorrhoids without mention of complication 2778  . GERD (gastroesophageal reflux disease)   . H/O cardiac catheterization 2005  . Heart murmur 2012   found by Dr. Jamal Collin  . Hyperlipidemia   . Hypertension   . Kidney stone   . Lesion of bladder   . Myocardial infarction ([place]) 2000  . Neuromuscular disorder ([place])    "achy muscles"  . Obesity   . Osteoarthritis   . PONV (postoperative nausea and vomiting)   . Recurrent UTI   . Seizure ([place])   . Spinal stenosis of cervical region     . Steatohepatitis 05/30/10   Brunt Stage 3, non alcoholic cirrhosis of the liver  . Type II or unspecified type diabetes mellitus without mention of complication, uncontrolled    Patient does takes Metformin and recommended to stop for 48 hours.  Marland Kitchen Ulcer   . Unspecified essential hypertension 2014     SURGICAL HISTORY   Past Surgical History:  Procedure Laterality Date  . ABDOMINAL HYSTERECTOMY  1982   complete  . BREAST CYST ASPIRATION Right 1990's   benign  . BREAST MASS EXCISION     benign  . CARDIAC CATHETERIZATION  2000   cardiac stent  . cardiac stents  2000  . CATARACT EXTRACTION     both eyes  . COLONOSCOPY  2004, 2015   Dr. Jamal Collin, Dr. Olevia Perches  . CYSTOSCOPY W/ RETROGRADES Left 11/12/2015   Procedure: CYSTOSCOPY WITH RETROGRADE PYELOGRAM;  Surgeon: Cleon Gustin, MD;  Location: [facility];  Service: Urology;  Laterality: Left;  . CYSTOSCOPY W/ URETERAL STENT PLACEMENT Left 11/12/2015   Procedure: CYSTOSCOPY WITH STENT REPLACEMENT;  Surgeon: Cleon Gustin, MD;  Location: [facility];  Service: Urology;  Laterality: Left;  . CYSTOSCOPY WITH BIOPSY N/A 09/26/2019  Procedure: CYSTOSCOPY WITH BIOPSY AND FULGURATION;  Surgeon: Cleon Gustin, MD;  Location: [facility];  Service: Urology;  Laterality: N/A;  . CYSTOSCOPY WITH URETEROSCOPY AND STENT PLACEMENT Left 10/15/2015   Procedure: CYSTOSCOPY WITH URETEROSCOPY AND STENT PLACEMENT;  Surgeon: Cleon Gustin, MD;  Location: [facility];  Service: Urology;  Laterality: Left;  . EYE SURGERY Bilateral 2012   cataract  . FOOT SURGERY  2008  . PTCA  2000   stent x 1  . ROBOTIC ASSITED PARTIAL NEPHRECTOMY Left 06/02/2019   Procedure: XI ROBOTIC ASSITED NEPHRECTOMY;  Surgeon: Cleon Gustin, MD;  Location: [facility];  Service: Urology;  Laterality: Left;  . SALPINGOOPHORECTOMY    . TONSILLECTOMY    . UPPER GI ENDOSCOPY    . URETEROSCOPY WITH HOLMIUM LASER LITHOTRIPSY Left 11/12/2015   Procedure:  URETEROSCOPY WITH HOLMIUM LASER LITHOTRIPSY;  Surgeon: Cleon Gustin, MD;  Location: [facility];  Service: Urology;  Laterality: Left;  . URETEROTOMY  1960/1969   x 2 during childhood  . VAGINAL HYSTERECTOMY       FAMILY HISTORY   Family History  Problem Relation Age of Onset  . Lung cancer Father   . Heart disease Father   . Diverticulosis Mother   . Diabetes Mother   . Stroke Mother 38  . Autoimmune disease Daughter   . Thyroid disease Daughter   . Thyroid disease Sister   . Stomach cancer Maternal Aunt   . Breast cancer Maternal Aunt   . Colon cancer Neg Hx   . Esophageal cancer Neg Hx   . Rectal cancer Neg Hx      SOCIAL HISTORY   Social History   Tobacco Use  . Smoking status: Former Smoker    Packs/day: 0.50    Years: 38.00    Pack years: 19.00    Types: Cigarettes    Quit date: 09/15/1998    Years since quitting: 21.4  . Smokeless tobacco: Never Used  Vaping Use  . Vaping Use: Never used  Substance Use Topics  . Alcohol use: Not Currently  . Drug use: No     MEDICATIONS    Home Medication:    Current Medication:  Current Facility-Administered Medications:  .  0.9 %  sodium chloride infusion, , Intravenous, Continuous, [place], Amy, MD, Last Rate: 50 mL/hr at 02/21/20 1215, New Bag at 02/21/20 1215 .  chlorhexidine (PERIDEX) 0.12 % solution, , , ,     ALLERGIES   Patient has no known allergies.     REVIEW OF SYSTEMS    Review of Systems:  Gen:  Denies  fever, sweats, chills weigh loss  HEENT: Denies blurred vision, double vision, ear pain, eye pain, hearing loss, nose bleeds, sore throat Cardiac:  No dizziness, chest pain or heaviness, chest tightness,edema Resp:   Denies cough or sputum porduction, shortness of breath,wheezing, hemoptysis,  Gi: Denies swallowing difficulty, stomach pain, nausea or vomiting, diarrhea, constipation, bowel incontinence Gu:  Denies bladder incontinence, burning urine Ext:   Denies Joint pain,  stiffness or swelling Skin: Denies  skin rash, easy bruising or bleeding or hives Endoc:  Denies polyuria, polydipsia , polyphagia or weight change Psych:   Denies depression, insomnia or hallucinations   Other:  All other systems negative   VS: BP (!) 153/64   Pulse (!) 52   Temp 98.8 F (37.1 C) (Oral)   Resp 14   Ht 5' 3"  (1.6 m)   Wt 82.1 kg   SpO2 98%  BMI 32.06 kg/m      PHYSICAL EXAM    GENERAL:NAD, no fevers, chills, no weakness no fatigue HEAD: Normocephalic, atraumatic.  EYES: Pupils equal, round, reactive to light. Extraocular muscles intact. No scleral icterus.  MOUTH: Moist mucosal membrane. Dentition intact. No abscess noted.  EAR, NOSE, THROAT: Clear without exudates. No external lesions.  NECK: Supple. No thyromegaly. No nodules. No JVD.  PULMONARY: Diffuse coarse rhonchi right sided +wheezes CARDIOVASCULAR: S1 and S2. Regular rate and rhythm. No murmurs, rubs, or gallops. No edema. Pedal pulses 2+ bilaterally.  GASTROINTESTINAL: Soft, nontender, nondistended. No masses. Positive bowel sounds. No hepatosplenomegaly.  MUSCULOSKELETAL: No swelling, clubbing, or edema. Range of motion full in all extremities.  NEUROLOGIC: Cranial nerves II through XII are intact. No gross focal neurological deficits. Sensation intact. Reflexes intact.  SKIN: No ulceration, lesions, rashes, or cyanosis. Skin warm and dry. Turgor intact.  PSYCHIATRIC: Mood, affect within normal limits. The patient is awake, alert and oriented x 3. Insight, judgment intact.       IMAGING    CT CHEST WO CONTRAST  Result Date: 02/19/2020 CLINICAL DATA:  74 year old female with stage IV lung cancer. Follow-up CT. EXAM: CT CHEST WITHOUT CONTRAST TECHNIQUE: Multidetector CT imaging of the chest was performed following the standard protocol without IV contrast. COMPARISON:  Chest CT dated 02/02/2020. FINDINGS: Evaluation of this exam is limited in the absence of intravenous contrast.  Cardiovascular: There is no cardiomegaly or pericardial effusion. Three-vessel coronary vascular calcification. Top-normal ascending aorta measure up to 3.9 cm. There is moderate atherosclerotic calcification of the thoracic aorta. The central pulmonary arteries are grossly unremarkable. Mediastinum/Nodes: No hilar or mediastinal adenopathy. Esophagus and the thyroid gland are grossly unremarkable. No mediastinal fluid collection. Lungs/Pleura: Left upper lobe lobulated mass abutting the mediastinal pleural measures approximately 6.6 x 3.4 cm (previously 7.5 x 4.4 cm measured in similar plane). A 5 mm nodule in the left upper lobe (55/3) appears similar to prior CT. There is a 13 mm nodule in the right lower lobe along the fissure, similar to prior CT. A cluster of nodular density in the right upper lobe measure approximately 1 cm in combined dimension (previously 1.2 cm). No consolidative changes. There is no pleural effusion pneumothorax. The central airways are patent. Upper Abdomen: Slight irregularity of the liver contour, likely early changes of cirrhosis. Trace free fluid adjacent to the spleen. Musculoskeletal: Osteopenia with degenerative changes of the spine. No acute osseous pathology. IMPRESSION: 1. Interval decrease in the size of the left upper lobe lobulated mass. 2. Bilateral pulmonary nodules as described above, similar to prior CT. 3. No hilar or mediastinal adenopathy. 4. Aortic Atherosclerosis (ICD10-I70.0). Electronically Signed   By: Anner Crete M.D.   On: 02/19/2020 20:37   MR ANGIO HEAD WO CONTRAST  Result Date: 02/02/2020 CLINICAL DATA:  Seizure. EXAM: MRA HEAD WITHOUT CONTRAST TECHNIQUE: Angiographic images of the Circle of Willis were obtained using MRA technique without intravenous contrast. COMPARISON:  MRI head 02/02/2020 FINDINGS: Left vertebral artery dominant. Both vertebral arteries patent to the basilar without stenosis. Left PICA patent. Probable small right PICA. Small  right AICA patent. Basilar widely patent. Superior cerebellar and posterior cerebral arteries patent without stenosis. Lobulated aneurysm right cavernous carotid in the Peri ophthalmic region. This measures approximately 6 x 7 mm. Cavernous carotid is patent bilaterally without stenosis Anterior and middle cerebral arteries patent bilaterally without stenosis or additional aneurysm. 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 Electronically  Signed   By: Franchot Gallo M.D.   On: 02/02/2020 11:28   MR ANGIO NECK W WO CONTRAST  Result Date: 02/02/2020 CLINICAL DATA:  Seizure. EXAM: MRA NECK WITHOUT AND WITH CONTRAST TECHNIQUE: Multiplanar and multiecho pulse sequences of the neck were obtained without and with intravenous contrast. Angiographic images of the neck were obtained using MRA technique without and with intravenous contrast. CONTRAST:  24m GADAVIST GADOBUTROL 1 MMOL/ML IV SOLN COMPARISON:  None. FINDINGS: Normal aortic arch. Proximal great vessels widely patent without stenosis. Carotid bifurcation is normal bilaterally. No significant stenosis or irregularity Antegrade flow in both vertebral arteries. Left vertebral artery dominant. No vertebral artery stenosis. Aneurysm right cavernous carotid. IMPRESSION: Negative MRA neck. Aneurysm right cavernous carotid. Electronically Signed   By: CFranchot GalloM.D.   On: 02/02/2020 11:25   MR BRAIN W WO CONTRAST  Result Date: 02/05/2020 CLINICAL DATA:  Brain mass or lesion.  Recent seizure. EXAM: MRI HEAD WITHOUT AND WITH CONTRAST TECHNIQUE: Multiplanar, multiecho pulse sequences of the brain and surrounding structures were obtained without and with intravenous contrast. CONTRAST:  946mGADAVIST GADOBUTROL 1 MMOL/ML IV SOLN COMPARISON:  02/02/2020 FINDINGS: Brain: Diffusion imaging does not show any acute or subacute infarction or other cause of restricted diffusion. No abnormality is seen affecting the  brainstem or cerebellum. The left cerebral hemisphere shows mild to moderate chronic small-vessel ischemic changes of the white matter. No cortical or large vessel territory infarction. No evidence of mass or abnormal enhancement affecting the left hemisphere. On the right, there are 2 enhancing mass lesions with central necrosis. Right posterior temporal lobe lesion axial image 75 measures 1 cm in diameter. Peripheral right frontal operculum lesion axial image 103 also show central necrosis, measuring up to 13 mm in diameter. Both are associated with vasogenic edema. No third lesion identified. Mild to moderate chronic small-vessel changes present within the white matter on the right. Vascular: Major vessels at the base of the brain show flow. Skull and upper cervical spine: Negative Sinuses/Orbits: Clear/normal Other: None IMPRESSION: 1. Two enhancing mass lesions with central necrosis located in the right frontal operculum (13 mm) and right posterior temporal lobe (10 mm) consistent with metastatic disease. Surrounding vasogenic edema. No third lesion identified. No change since 3 days ago. 2. Mild to moderate chronic small-vessel ischemic changes of the cerebral hemispheric white matter. Electronically Signed   By: MaNelson Chimes.D.   On: 02/05/2020 15:19   MR BRAIN W WO CONTRAST  Result Date: 02/02/2020 CLINICAL DATA:  Seizure.  Abnormal CT head. EXAM: MRI HEAD WITHOUT AND WITH CONTRAST TECHNIQUE: Multiplanar, multiecho pulse sequences of the brain and surrounding structures were obtained without and with intravenous contrast. CONTRAST:  25m52mADAVIST GADOBUTROL 1 MMOL/ML IV SOLN COMPARISON:  CT head 02/02/2020 FINDINGS: Brain: Peripheral enhancing mass lesion in the right middle frontal lobe laterally with central necrosis and surrounding edema. The enhancing mass measures up to 15 mm in diameter. This is peripherally located. Second peripherally enhancing mass lesion in the right posterior temporal lobe  measures 9 mm in diameter with surrounding vasogenic edema. No third lesion identified. Negative for acute infarct. Scattered small white matter hyperintensities compatible with mild chronic microvascular ischemia. Negative for hydrocephalus or hemorrhage. Vascular: Normal arterial flow voids Skull and upper cervical spine: No focal skeletal lesion. Sinuses/Orbits: Mild mucosal edema paranasal sinuses. Bilateral cataract extraction Other: None IMPRESSION: Two enhancing mass lesions are present in the right cerebral hemisphere with surrounding edema and central necrosis compatible with metastatic  disease. No midline shift Mild chronic microvascular ischemic change in the white matter. No acute infarct. Electronically Signed   By: Franchot Gallo M.D.   On: 02/02/2020 11:22   CT CHEST ABDOMEN PELVIS W CONTRAST  Result Date: 02/02/2020 CLINICAL DATA:  74 year old female with abnormal brain MRI and CT today compatible with cerebral metastases. Unknown primary. History of left oophorectomy for nonfunctioning left kidney. EXAM: CT CHEST, ABDOMEN, AND PELVIS WITH CONTRAST TECHNIQUE: Multidetector CT imaging of the chest, abdomen and pelvis was performed following the standard protocol during bolus administration of intravenous contrast. CONTRAST:  124m OMNIPAQUE IOHEXOL 300 MG/ML  SOLN COMPARISON:  Brain MRI today. [facility] CT Abdomen and Pelvis 07/28/2019 and earlier. FINDINGS: CT CHEST FINDINGS Cardiovascular: Calcified aortic atherosclerosis. Calcified coronary artery atherosclerosis. No cardiomegaly or pericardial effusion. Mediastinum/Nodes: Left upper lung mass inseparable from the mediastinum including the aortic arch (series 5, image 84). But mediastinal lymph nodes remains small, up to 6 mm short axis. Lungs/Pleura: Lobulated up to 8 cm mass of the medial left upper lung inseparable from the anterior lung apex and mediastinum. No associated left pleural effusion. There is contralateral  right lower lobe small but spiculated nodule measuring about 11 mm (series 5, image 102). There are also two smaller sub solid nodules in the right upper lobe on series 4, image 48 and image 70. Superimposed scattered bilateral subpleural pulmonary scarring. Musculoskeletal: Stable mild T4 superior endplate compression. No acute or suspicious osseous lesion identified in the chest. CT ABDOMEN PELVIS FINDINGS Hepatobiliary: Hepatic steatosis and mildly nodular liver contour (series 2, image 55) suspicious for cirrhosis. No discrete liver lesion. Negative gallbladder. Pancreas: Partially atrophied but otherwise negative. Spleen: Negative.  Incidental splenule (normal variant). Adrenals/Urinary Tract: Normal adrenal glands. Surgically absent left kidney as before. Right renal enhancement and contrast excretion is preserved. An anterior midpole exophytic low-density renal mass measures up to 3.8 cm diameter now, versus 2.2 cm in 2017. This has low to intermediate density by CT. On 2018 renal ultrasound this appeared simple and benign. Negative right ureter. Diminutive and unremarkable urinary bladder. Stomach/Bowel: Diverticulosis of the sigmoid colon. Oral contrast has reached the splenic flexure without obstruction. Decompressed ascending and proximal transverse colon. Negative appendix. Decompressed terminal ileum. No dilated small bowel. Unremarkable stomach. Chronic duodenal diverticulum near the midline appears inconsequential. No free air. No free fluid. Vascular/Lymphatic: Extensive Aortoiliac calcified atherosclerosis. Major arterial structures remain patent. Portal venous system is patent. No lymphadenopathy. Reproductive: Surgically absent uterus. Ovaries are within normal limits on series 2, image 105). Other: No pelvic free fluid. Small umbilical hernia with trace fluid is stable. Musculoskeletal: Advanced lumbar spine degeneration including grade 1 spondylolisthesis in the lower lumbar spine with bulky  facet hypertrophy. No acute or suspicious osseous lesion identified. There is a chronic oval mixed density soft tissue mass along the anterior lower sacrum which has only mildly increased in size since 2017, measuring up to 4 cm diameter now on series 2, image 102. This is stable since March. IMPRESSION: 1. Positive for an 8 cm left upper lung mass inseparable from the mediastinum compatible with Bronchogenic Carcinoma. 2. Mediastinal lymph nodes remain normal by size criteria. There are three indeterminate small but spiculated right lung nodules, the largest measuring 11 mm in the lower lobe. 3. No metastatic disease identified in the abdomen or pelvis. 4. Suspicion of Cirrhosis with hepatic steatosis. 5. Chronic presacral soft tissue mass favored to be benign myelolipoma. Chronic right renal cyst which had a benign ultrasound appearance  in 2018. Sigmoid diverticulosis. Duodenal diverticulum. 6. Calcified coronary artery and Aortic atherosclerosis (ICD10-I70.0). Electronically Signed   By: Genevie Ann M.D.   On: 02/02/2020 20:54   CT HEAD CODE STROKE WO CONTRAST  Result Date: 02/02/2020 CLINICAL DATA:  Code stroke. Slurred speech and left-sided numbness. EXAM: CT HEAD WITHOUT CONTRAST TECHNIQUE: Contiguous axial images were obtained from the base of the skull through the vertex without intravenous contrast. COMPARISON:  None. FINDINGS: Brain: 2 areas moderate edema in the right cerebral hemisphere, lateral frontal and temporoparietal. Although both in the MCA distribution these show preserved cortex, more suggestive of vasogenic than cytotoxic edema. No acute hemorrhage, hydrocephalus, or shift. Vascular: No hyperdense vessel. Skull: Normal. Negative for fracture or focal lesion. Sinuses/Orbits: Bilateral cataract resection Other: Critical Value/emergent results were called by telephone at the time of interpretation on 02/02/2020 at 9:16 am to provider Merlyn Lot , who verbally acknowledged these results.  ASPECTS [facility] Stroke Program Early CT Score) Not scored given the above IMPRESSION: Two areas of edema in the right cerebral hemisphere. Although both are located in the MCA distribution, these have the appearance of vasogenic edema - more or worrisome for mass lesion than infarct. Recommend brain MRI with contrast. Electronically Signed   By: Monte Fantasia M.D.   On: 02/02/2020 09:19      ASSESSMENT/PLAN   Left upper lobe mass - plan for ENB today - discussed with patient she is agreeable   -Reviewed risks/complications and benefits with patient, risks include infection, pneumothorax/pneumomediastinum which may require chest tube placement as well as overnight/prolonged hospitalization and possible mechanical ventilation. Other risks include bleeding and very rarely death.  Patient understands risks and wishes to proceed.  Additional questions were answered, and patient is aware that post procedure patient will be going home with family and may experience cough with possible clots on expectoration as well as phlegm which may last few days as well as hoarseness of voice post intubation and mechanical ventilation.      Thank you for allowing me to participate in the care of this patient.   Patient/Family are satisfied with care plan and all questions have been answered.  This document was prepared using Dragon voice recognition software and may include unintentional dictation errors.     Ottie Glazier, M.D.  Division of [facility]

## 2020-02-21 NOTE — Anesthesia Preprocedure Evaluation (Signed)
Anesthesia Evaluation  Patient identified by MRN, date of birth, ID band Patient awake    Reviewed: Allergy & Precautions, NPO status , Patient's Chart, lab work & pertinent test results  History of Anesthesia Complications (+) PONV and history of anesthetic complications  Airway Mallampati: III  TM Distance: >3 FB Neck ROM: Full    Dental  (+) Poor Dentition   Pulmonary neg sleep apnea, neg COPD, former smoker,    breath sounds clear to auscultation- rhonchi (-) wheezing      Cardiovascular hypertension, Pt. on medications + CAD, + Past MI and + Cardiac Stents  (-) CABG  Rhythm:Regular Rate:Normal - Systolic murmurs and - Diastolic murmurs    Neuro/Psych Seizures - (2/2 brain mets),  negative psych ROS   GI/Hepatic GERD  ,(+) Hepatitis - ([facility])  Endo/Other  diabetes, Oral Hypoglycemic Agents  Renal/GU Renal InsufficiencyRenal disease     Musculoskeletal  (+) Arthritis ,   Abdominal (+) + obese,   Peds  Hematology negative hematology ROS (+)   Anesthesia Other Findings Past Medical History: No date: Allergy No date: Atrophic kidney     Comment:  left 45 yrs ago: Blood clot in vein     Comment:  inabdoment after surgery No date: Blood transfusion without reported diagnosis No date: Cataract No date: Complication of anesthesia No date: Coronary artery disease No date: Coronary atherosclerosis No date: Diabetes mellitus 2005: Diverticulosis     Comment:  Dr Jamal Collin No date: Dyspnea     Comment:  with exertion 2014: External hemorrhoids without mention of complication No date: GERD (gastroesophageal reflux disease) 2005: H/O cardiac catheterization 2012: Heart murmur     Comment:  found by Dr. Jamal Collin No date: Hyperlipidemia No date: Hypertension No date: Kidney stone No date: Lesion of bladder 2000: Myocardial infarction ([place]) No date: Neuromuscular disorder ([place])     Comment:  "achy muscles" No date:  Obesity No date: Osteoarthritis No date: PONV (postoperative nausea and vomiting) No date: Recurrent UTI No date: Seizure ([place]) No date: Spinal stenosis of cervical region 05/30/10: Steatohepatitis     Comment:  Brunt Stage 3, non alcoholic cirrhosis of the liver No date: Type II or unspecified type diabetes mellitus without  mention of complication, uncontrolled     Comment:  Patient does takes Metformin and recommended to stop for              48 hours. No date: Ulcer 2014: Unspecified essential hypertension   Reproductive/Obstetrics                             Anesthesia Physical Anesthesia Plan  ASA: III  Anesthesia Plan: General   Post-op Pain Management:    Induction: Intravenous  PONV Risk Score and Plan: 3 and Dexamethasone, Ondansetron and Treatment may vary due to age or medical condition  Airway Management Planned: Oral ETT  Additional Equipment:   Intra-op Plan:   Post-operative Plan: Extubation in OR  Informed Consent: I have reviewed the patients History and Physical, chart, labs and discussed the procedure including the risks, benefits and alternatives for the proposed anesthesia with the patient or authorized representative who has indicated his/her understanding and acceptance.     Dental advisory given  Plan Discussed with: CRNA and Anesthesiologist  Anesthesia Plan Comments:         Anesthesia Quick Evaluation

## 2020-02-22 ENCOUNTER — Encounter: Payer: Self-pay | Admitting: Pulmonary Disease

## 2020-02-23 ENCOUNTER — Other Ambulatory Visit: Payer: Self-pay

## 2020-02-23 ENCOUNTER — Other Ambulatory Visit: Payer: Self-pay | Admitting: Internal Medicine

## 2020-02-23 DIAGNOSIS — C7931 Secondary malignant neoplasm of brain: Secondary | ICD-10-CM

## 2020-02-23 LAB — SURGICAL PATHOLOGY

## 2020-02-23 LAB — CYTOLOGY - NON PAP

## 2020-02-26 ENCOUNTER — Other Ambulatory Visit: Payer: Self-pay

## 2020-02-26 ENCOUNTER — Inpatient Hospital Stay: Payer: PPO | Admitting: Hospice and Palliative Medicine

## 2020-02-26 ENCOUNTER — Inpatient Hospital Stay: Payer: PPO

## 2020-02-26 ENCOUNTER — Encounter: Payer: Self-pay | Admitting: Internal Medicine

## 2020-02-26 ENCOUNTER — Inpatient Hospital Stay (HOSPITAL_BASED_OUTPATIENT_CLINIC_OR_DEPARTMENT_OTHER): Payer: PPO | Admitting: Internal Medicine

## 2020-02-26 DIAGNOSIS — C3412 Malignant neoplasm of upper lobe, left bronchus or lung: Secondary | ICD-10-CM | POA: Insufficient documentation

## 2020-02-26 DIAGNOSIS — C7931 Secondary malignant neoplasm of brain: Secondary | ICD-10-CM

## 2020-02-26 LAB — COMPREHENSIVE METABOLIC PANEL
ALT: 38 U/L (ref 0–44)
AST: 18 U/L (ref 15–41)
Albumin: 3.7 g/dL (ref 3.5–5.0)
Alkaline Phosphatase: 37 U/L — ABNORMAL LOW (ref 38–126)
Anion gap: 9 (ref 5–15)
BUN: 30 mg/dL — ABNORMAL HIGH (ref 8–23)
CO2: 24 mmol/L (ref 22–32)
Calcium: 8 mg/dL — ABNORMAL LOW (ref 8.9–10.3)
Chloride: 96 mmol/L — ABNORMAL LOW (ref 98–111)
Creatinine, Ser: 0.93 mg/dL (ref 0.44–1.00)
GFR, Estimated: >60 mL/min (ref >60)
Glucose, Bld: 443 mg/dL — ABNORMAL HIGH (ref 70–99)
Potassium: 3.8 mmol/L (ref 3.5–5.1)
Sodium: 129 mmol/L — ABNORMAL LOW (ref 135–145)
Total Bilirubin: 1 mg/dL (ref 0.3–1.2)
Total Protein: 6.2 g/dL — ABNORMAL LOW (ref 6.5–8.1)

## 2020-02-26 LAB — CBC WITH DIFFERENTIAL/PLATELET
Abs Immature Granulocytes: 0.06 10*3/uL (ref 0.00–0.07)
Basophils Absolute: 0 10*3/uL (ref 0.0–0.1)
Basophils Relative: 0 %
Eosinophils Absolute: 0 10*3/uL (ref 0.0–0.5)
Eosinophils Relative: 0 %
HCT: 39.8 % (ref 36.0–46.0)
Hemoglobin: 13.2 g/dL (ref 12.0–15.0)
Immature Granulocytes: 1 %
Lymphocytes Relative: 21 %
Lymphs Abs: 1.5 10*3/uL (ref 0.7–4.0)
MCH: 26.7 pg (ref 26.0–34.0)
MCHC: 33.2 g/dL (ref 30.0–36.0)
MCV: 80.4 fL (ref 80.0–100.0)
Monocytes Absolute: 0.3 10*3/uL (ref 0.1–1.0)
Monocytes Relative: 4 %
Neutro Abs: 5.1 10*3/uL (ref 1.7–7.7)
Neutrophils Relative %: 74 %
Platelets: 121 10*3/uL — ABNORMAL LOW (ref 150–400)
RBC: 4.95 MIL/uL (ref 3.87–5.11)
RDW: 15 % (ref 11.5–15.5)
WBC: 6.9 10*3/uL (ref 4.0–10.5)
nRBC: 0 % (ref 0.0–0.2)

## 2020-02-26 MED ORDER — LANTUS SOLOSTAR 100 UNIT/ML ~~LOC~~ SOPN: 20.0000 [IU] | PEN_INJECTOR | Freq: Every day | SUBCUTANEOUS | 99 refills | Status: DC

## 2020-02-26 NOTE — Telephone Encounter (Signed)
[id number]

## 2020-02-26 NOTE — Progress Notes (Signed)
[place] CONSULT NOTE  Patient Care Team: [name], MD as PCP - General (Internal Medicine) [name], MD (General Surgery) [name], MD (Internal Medicine) Telford Nab, RN as Oncology Nurse Navigator  CHIEF COMPLAINTS/PURPOSE OF CONSULTATION:  Brain metastases  #  Oncology History Overview Note  # OCT 2021-left upper lobe lung mass invading mediastinum; bronc/Bx-  Oct 20th [Dr.Aleskerov]; non-small cell carcinoma/adenocarcinoma.  Stage IV [brain metastases]; awaiting on NGS.  #Right frontal/temporal brain met x2 [1-1.59m]-seizure- on dex; Keppra; SBRT s/p Oct 13th, 2021  # DM on OHA; HTN; SOLITARY KIDNEY [frequent infections/ atrophic- s/p nephrectomy 2020]  # NGS/MOLECULAR TESTS:Pending    # PALLIATIVE CARE EVALUATION:  # PAIN MANAGEMENT:    DIAGNOSIS: Lung cancer  STAGE:   IV      ;  GOALS: Palliative  CURRENT/MOST RECENT THERAPY :     [place]rain metastasis ([facility]  02/02/2020 Initial Diagnosis   Brain metastasis ([facility]   Primary cancer of left upper lobe of lung ([facility]  02/26/2020 Initial Diagnosis   Primary cancer of left upper lobe of lung (HCC)    HISTORY OF PRESENTING ILLNESS:  Kimberly Crazier89y.o.  female  patient with a history of solitary kidney; diabetes obesity; prior history of smoking; in left upper lobe lung mass; brain metastasis is here for follow-up.  Patient underwent SBRT for the brain lesions October 13.  She is currently on steroids dexamethasone 4 mg twice daily.  Patient also underwent bronchoscopy and biopsy last week.  She is here to review the results of her pathology.  No further seizures.  Patient had a beach trip uneventful.  Patient complains of leg swelling.  Denies any nausea vomiting headache.  No chest pain or shortness of the cough.  However noted to have streaks of blood in the sputum this morning.  Review of Systems  Constitutional: Positive for malaise/fatigue. Negative for chills,  diaphoresis, fever and weight loss.  HENT: Negative for nosebleeds and sore throat.   Eyes: Negative for double vision.  Respiratory: Positive for hemoptysis. Negative for cough, sputum production and wheezing.   Cardiovascular: Positive for leg swelling. Negative for chest pain, palpitations and orthopnea.  Gastrointestinal: Negative for abdominal pain, blood in stool, constipation, diarrhea, heartburn, melena, nausea and vomiting.  Genitourinary: Negative for dysuria, frequency and urgency.  Musculoskeletal: Negative for back pain and joint pain.  Skin: Negative.  Negative for itching and rash.  Neurological: Negative for dizziness, tingling, focal weakness, weakness and headaches.  Endo/Heme/Allergies: Does not bruise/bleed easily.  Psychiatric/Behavioral: Negative for depression. The patient is not nervous/anxious and does not have insomnia.      MEDICAL HISTORY:  Past Medical History:  Diagnosis Date  . Allergy   . Atrophic kidney    left  . Blood clot in vein 45 yrs ago   inabdoment after surgery  . Blood transfusion without reported diagnosis   . Cataract   . Complication of anesthesia   . Coronary artery disease   . Coronary atherosclerosis   . Diabetes mellitus   . Diverticulosis 2005   Dr SJamal Collin . Dyspnea    with exertion  . External hemorrhoids without mention of complication 20454 . GERD (gastroesophageal reflux disease)   . H/O cardiac catheterization 2005  . Heart murmur 2012   found by Dr. SJamal Collin . Hyperlipidemia   . Hypertension   . Kidney stone   . Lesion of bladder   . Myocardial infarction ([place] 2000  .  Neuromuscular disorder ([place])    "achy muscles"  . Obesity   . Osteoarthritis   . PONV (postoperative nausea and vomiting)   . Recurrent UTI   . Seizure ([place])   . Spinal stenosis of cervical region   . Steatohepatitis 05/30/10   Brunt Stage 3, non alcoholic cirrhosis of the liver  . Type II or unspecified type diabetes mellitus without mention of  complication, uncontrolled    Patient does takes Metformin and recommended to stop for 48 hours.  Marland Kitchen Ulcer   . Unspecified essential hypertension 2014    SURGICAL HISTORY: Past Surgical History:  Procedure Laterality Date  . ABDOMINAL HYSTERECTOMY  1982   complete  . BREAST CYST ASPIRATION Right 1990's   benign  . BREAST MASS EXCISION     benign  . CARDIAC CATHETERIZATION  2000   cardiac stent  . cardiac stents  2000  . CATARACT EXTRACTION     both eyes  . COLONOSCOPY  2004, 2015   Dr. Jamal Collin, Dr. Olevia Perches  . CYSTOSCOPY W/ RETROGRADES Left 11/12/2015   Procedure: CYSTOSCOPY WITH RETROGRADE PYELOGRAM;  Surgeon: Cleon Gustin, MD;  Location: [facility];  Service: Urology;  Laterality: Left;  . CYSTOSCOPY W/ URETERAL STENT PLACEMENT Left 11/12/2015   Procedure: CYSTOSCOPY WITH STENT REPLACEMENT;  Surgeon: Cleon Gustin, MD;  Location: [facility];  Service: Urology;  Laterality: Left;  . CYSTOSCOPY WITH BIOPSY N/A 09/26/2019   Procedure: CYSTOSCOPY WITH BIOPSY AND FULGURATION;  Surgeon: Cleon Gustin, MD;  Location: [facility];  Service: Urology;  Laterality: N/A;  . CYSTOSCOPY WITH URETEROSCOPY AND STENT PLACEMENT Left 10/15/2015   Procedure: CYSTOSCOPY WITH URETEROSCOPY AND STENT PLACEMENT;  Surgeon: Cleon Gustin, MD;  Location: [facility];  Service: Urology;  Laterality: Left;  . EYE SURGERY Bilateral 2012   cataract  . FOOT SURGERY  2008  . PTCA  2000   stent x 1  . ROBOTIC ASSITED PARTIAL NEPHRECTOMY Left 06/02/2019   Procedure: XI ROBOTIC ASSITED NEPHRECTOMY;  Surgeon: Cleon Gustin, MD;  Location: [facility];  Service: Urology;  Laterality: Left;  . SALPINGOOPHORECTOMY    . TONSILLECTOMY    . UPPER GI ENDOSCOPY    . URETEROSCOPY WITH HOLMIUM LASER LITHOTRIPSY Left 11/12/2015   Procedure: URETEROSCOPY WITH HOLMIUM LASER LITHOTRIPSY;  Surgeon: Cleon Gustin, MD;  Location: [facility];  Service: Urology;  Laterality: Left;  . URETEROTOMY   1960/1969   x 2 during childhood  . VAGINAL HYSTERECTOMY    . VIDEO BRONCHOSCOPY WITH ENDOBRONCHIAL NAVIGATION N/A 02/21/2020   Procedure: VIDEO BRONCHOSCOPY WITH ENDOBRONCHIAL NAVIGATION;  Surgeon: Ottie Glazier, MD;  Location: [facility];  Service: Thoracic;  Laterality: N/A;  . VIDEO BRONCHOSCOPY WITH ENDOBRONCHIAL ULTRASOUND N/A 02/21/2020   Procedure: VIDEO BRONCHOSCOPY WITH ENDOBRONCHIAL ULTRASOUND;  Surgeon: Ottie Glazier, MD;  Location: [facility];  Service: Thoracic;  Laterality: N/A;    SOCIAL HISTORY: Social History   Socioeconomic History  . Marital status: Single    Spouse name: Not on file  . Number of children: 2  . Years of education: Not on file  . Highest education level: Not on file  Occupational History  . 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 Smoking status: Former Smoker    Packs/day: 0.50    Years: 38.00    Pack years: 19.00    Types: Cigarettes    Quit date: 09/15/1998    Years since quitting: 21.4  .  Smokeless tobacco: Never Used  Vaping Use  . Vaping Use: Never used  Substance and Sexual Activity  . Alcohol use: Not Currently  . Drug use: No  . Sexual activity: Not Currently  Other Topics Concern  . Not on file  Social History Narrative   Lives in [place]; daughter lives in [place]. Quit smoking may 14th 2000. No alcohol. Retired  From [employer] job.    Social Determinants of Health   Financial Resource Strain: Low Risk   . Difficulty of Paying Living Expenses: Not hard at all  Food Insecurity: No Food Insecurity  . Worried About Charity fundraiser in the Last Year: Never true  . Ran Out of Food in the Last Year: Never true  Transportation Needs: No Transportation Needs  . Lack of Transportation (Medical): No  . Lack of Transportation (Non-Medical): No  Physical Activity:   . Days of Exercise per Week: Not on file  . Minutes of Exercise per Session: Not on file  Stress: No Stress  Concern Present  . Feeling of Stress : Not at all  Social Connections: Unknown  . Frequency of Communication with Friends and Family: More than three times a week  . Frequency of Social Gatherings with Friends and Family: More than three times a week  . Attends Religious Services: More than 4 times per year  . Active Member of Clubs or Organizations: Yes  . Attends Archivist Meetings: More than 4 times per year  . Marital Status: Not on file  Intimate Partner Violence: Not At Risk  . Fear of Current or Ex-Partner: No  . Emotionally Abused: No  . Physically Abused: No  . Sexually Abused: No    FAMILY HISTORY: Family History  Problem Relation Age of Onset  . Lung cancer Father   . Heart disease Father   . Diverticulosis Mother   . Diabetes Mother   . Stroke Mother 82  . Autoimmune disease Daughter   . Thyroid disease Daughter   . Thyroid disease Sister   . Stomach cancer Maternal Aunt   . Breast cancer Maternal Aunt   . Colon cancer Neg Hx   . Esophageal cancer Neg Hx   . Rectal cancer Neg Hx     ALLERGIES:  has No Known Allergies.  MEDICATIONS:  Current Outpatient Medications  Medication Sig Dispense Refill  . denosumab (PROLIA) 60 MG/ML SOSY injection Inject into the skin.    Marland Kitchen dexamethasone (DECADRON) 4 MG tablet Take 1 tablet (4 mg total) by mouth 2 (two) times daily. 30 tablet 0  . furosemide (LASIX) 40 MG tablet TAKE ONE TABLET EVERY DAY (Patient taking differently: Take 40 mg by mouth daily. ) 90 tablet 1  . glipiZIDE (GLUCOTROL) 10 MG tablet Take 1 tablet (10 mg total) by mouth 2 (two) times daily before a meal. 60 tablet 0  . levETIRAcetam (KEPPRA) 500 MG tablet Take 1 tablet (500 mg total) by mouth 2 (two) times daily. 60 tablet 0  . losartan (COZAAR) 50 MG tablet TAKE ONE TABLET EVERY DAY (Patient taking differently: Take 50 mg by mouth every morning. ) 90 tablet 3  . omeprazole (PRILOSEC) 40 MG capsule TAKE 1 CAPSULE BY MOUTH ONCE DAILY (Patient  taking differently: Take 40 mg by mouth every morning. ) 90 capsule 1  . ONETOUCH ULTRA test strip USE AS INSTRUCTED 100 each 12  . Probiotic Product (PROBIOTIC DAILY PO) Take 1 tablet by mouth daily. GUMMIES    . rosuvastatin (CRESTOR)  10 MG tablet TAKE ONE TABLET EVERY DAY (Patient taking differently: Take 10 mg by mouth at bedtime. ) 90 tablet 1  . senna-docusate (SENOKOT-S) 8.6-50 MG tablet Take 2 tablets by mouth daily as needed for mild constipation. 30 tablet 1  . Vitamin D, Ergocalciferol, (DRISDOL) 1.25 MG (50000 UNIT) CAPS capsule TAKE ONE CAPSULE EACH WEEK (Patient taking differently: Take 50,000 Units by mouth every 7 (seven) days. THURSDAYS) 4 capsule 11  . insulin glargine (LANTUS SOLOSTAR) 100 UNIT/ML Solostar Pen Inject 20 Units into the skin daily. (Patient not taking: Reported on 02/26/2020) 15 mL PRN  . loperamide (IMODIUM A-D) 2 MG tablet Take 4 mg by mouth 2 (two) times daily as needed for diarrhea or loose stools. (Patient not taking: Reported on 02/26/2020)     No current facility-administered medications for this visit.      Marland Kitchen  PHYSICAL EXAMINATION: ECOG PERFORMANCE STATUS: 1 - Symptomatic but completely ambulatory  Vitals:   02/26/20 1427  BP: (!) 141/57  Pulse: 64  Resp: 18  Temp: 97.7 F (36.5 C)  SpO2: 97%   Filed Weights   02/26/20 1427  Weight: 183 lb (83 kg)    Physical Exam Vitals and nursing note reviewed.  Constitutional:      Comments: Patient is alone.  She is walking independently.  HENT:     Head: Normocephalic and atraumatic.     Mouth/Throat:     Pharynx: No oropharyngeal exudate.  Eyes:     Pupils: Pupils are equal, round, and reactive to light.  Cardiovascular:     Rate and Rhythm: Normal rate and regular rhythm.  Pulmonary:     Effort: No respiratory distress.     Breath sounds: No wheezing.  Abdominal:     General: Bowel sounds are normal. There is no distension.     Palpations: Abdomen is soft. There is no mass.      Tenderness: There is no abdominal tenderness. There is no guarding or rebound.  Musculoskeletal:        General: No tenderness. Normal range of motion.     Cervical back: Normal range of motion and neck supple.  Skin:    General: Skin is warm.  Neurological:     Mental Status: She is alert and oriented to person, place, and time.  Psychiatric:        Mood and Affect: Affect normal.      LABORATORY DATA:  I have reviewed the data as listed Lab Results  Component Value Date   WBC 6.9 02/26/2020   HGB 13.2 02/26/2020   HCT 39.8 02/26/2020   MCV 80.4 02/26/2020   PLT 121 (L) 02/26/2020   Recent Labs    06/03/19 0434 06/29/19 0819 02/02/20 0925 02/02/20 0925 02/03/20 0541 02/09/20 1002 02/26/20 1407  NA 136   < > 137   < > 133* 136 129*  K 4.2   < > 4.2   < > 4.3 3.9 3.8  CL 104   < > 100   < > 99 99 96*  CO2 26   < > 27   < > _0 GLUCOSE 125*   < > 177*   < > 248* 214* 443*  BUN 11   < > 16   < > 18 42* 30*  CREATININE 0.62   < > 0.81   < > 0.78 0.89 0.93  CALCIUM 9.0   < > 9.6   < > 9.2 9.3 8.0*  GFRNONAA >  60  --  >60   < > >60 >60 >60  GFRAA >60  --  >60  --  >60  --   --   PROT  --    < > 7.7  --   --  7.4 6.2*  ALBUMIN  --    < > 4.5  --   --  4.3 3.7  AST  --    < > 29  --   --  22 18  ALT  --    < > 31  --   --  41 38  ALKPHOS  --    < > 43  --   --  43 37*  BILITOT  --    < > 0.9  --   --  1.0 1.0   < > = values in this interval not displayed.    RADIOGRAPHIC STUDIES: I have personally reviewed the radiological images as listed and agreed with the findings in the report. CT CHEST WO CONTRAST  Result Date: 02/19/2020 CLINICAL DATA:  74 year old female with stage IV lung cancer. Follow-up CT. EXAM: CT CHEST WITHOUT CONTRAST TECHNIQUE: Multidetector CT imaging of the chest was performed following the standard protocol without IV contrast. COMPARISON:  Chest CT dated 02/02/2020. FINDINGS: Evaluation of this exam is limited in the absence of intravenous  contrast. Cardiovascular: There is no cardiomegaly or pericardial effusion. Three-vessel coronary vascular calcification. Top-normal ascending aorta measure up to 3.9 cm. There is moderate atherosclerotic calcification of the thoracic aorta. The central pulmonary arteries are grossly unremarkable. Mediastinum/Nodes: No hilar or mediastinal adenopathy. Esophagus and the thyroid gland are grossly unremarkable. No mediastinal fluid collection. Lungs/Pleura: Left upper lobe lobulated mass abutting the mediastinal pleural measures approximately 6.6 x 3.4 cm (previously 7.5 x 4.4 cm measured in similar plane). A 5 mm nodule in the left upper lobe (55/3) appears similar to prior CT. There is a 13 mm nodule in the right lower lobe along the fissure, similar to prior CT. A cluster of nodular density in the right upper lobe measure approximately 1 cm in combined dimension (previously 1.2 cm). No consolidative changes. There is no pleural effusion pneumothorax. The central airways are patent. Upper Abdomen: Slight irregularity of the liver contour, likely early changes of cirrhosis. Trace free fluid adjacent to the spleen. Musculoskeletal: Osteopenia with degenerative changes of the spine. No acute osseous pathology. IMPRESSION: 1. Interval decrease in the size of the left upper lobe lobulated mass. 2. Bilateral pulmonary nodules as described above, similar to prior CT. 3. No hilar or mediastinal adenopathy. 4. Aortic Atherosclerosis (ICD10-I70.0). Electronically Signed   By: Anner Crete M.D.   On: 02/19/2020 20:37   MR ANGIO HEAD WO CONTRAST  Result Date: 02/02/2020 CLINICAL DATA:  Seizure. EXAM: MRA HEAD WITHOUT CONTRAST TECHNIQUE: Angiographic images of the Circle of Willis were obtained using MRA technique without intravenous contrast. COMPARISON:  MRI head 02/02/2020 FINDINGS: Left vertebral artery dominant. Both vertebral arteries patent to the basilar without stenosis. Left PICA patent. Probable small right  PICA. Small right AICA patent. Basilar widely patent. Superior cerebellar and posterior cerebral arteries patent without stenosis. Lobulated aneurysm right cavernous carotid in the Peri ophthalmic region. This measures approximately 6 x 7 mm. Cavernous carotid is patent bilaterally without stenosis Anterior and middle cerebral arteries patent bilaterally without stenosis or additional aneurysm. 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 Electronically Signed   By: Franchot Gallo M.D.   On:  02/02/2020 11:28   MR ANGIO NECK W WO CONTRAST  Result Date: 02/02/2020 CLINICAL DATA:  Seizure. EXAM: MRA NECK WITHOUT AND WITH CONTRAST TECHNIQUE: Multiplanar and multiecho pulse sequences of the neck were obtained without and with intravenous contrast. Angiographic images of the neck were obtained using MRA technique without and with intravenous contrast. CONTRAST:  46m GADAVIST GADOBUTROL 1 MMOL/ML IV SOLN COMPARISON:  None. FINDINGS: Normal aortic arch. Proximal great vessels widely patent without stenosis. Carotid bifurcation is normal bilaterally. No significant stenosis or irregularity Antegrade flow in both vertebral arteries. Left vertebral artery dominant. No vertebral artery stenosis. Aneurysm right cavernous carotid. IMPRESSION: Negative MRA neck. Aneurysm right cavernous carotid. Electronically Signed   By: CFranchot GalloM.D.   On: 02/02/2020 11:25   MR BRAIN W WO CONTRAST  Result Date: 02/05/2020 CLINICAL DATA:  Brain mass or lesion.  Recent seizure. EXAM: MRI HEAD WITHOUT AND WITH CONTRAST TECHNIQUE: Multiplanar, multiecho pulse sequences of the brain and surrounding structures were obtained without and with intravenous contrast. CONTRAST:  975mGADAVIST GADOBUTROL 1 MMOL/ML IV SOLN COMPARISON:  02/02/2020 FINDINGS: Brain: Diffusion imaging does not show any acute or subacute infarction or other cause of restricted diffusion. No abnormality is seen  affecting the brainstem or cerebellum. The left cerebral hemisphere shows mild to moderate chronic small-vessel ischemic changes of the white matter. No cortical or large vessel territory infarction. No evidence of mass or abnormal enhancement affecting the left hemisphere. On the right, there are 2 enhancing mass lesions with central necrosis. Right posterior temporal lobe lesion axial image 75 measures 1 cm in diameter. Peripheral right frontal operculum lesion axial image 103 also show central necrosis, measuring up to 13 mm in diameter. Both are associated with vasogenic edema. No third lesion identified. Mild to moderate chronic small-vessel changes present within the white matter on the right. Vascular: Major vessels at the base of the brain show flow. Skull and upper cervical spine: Negative Sinuses/Orbits: Clear/normal Other: None IMPRESSION: 1. Two enhancing mass lesions with central necrosis located in the right frontal operculum (13 mm) and right posterior temporal lobe (10 mm) consistent with metastatic disease. Surrounding vasogenic edema. No third lesion identified. No change since 3 days ago. 2. Mild to moderate chronic small-vessel ischemic changes of the cerebral hemispheric white matter. Electronically Signed   By: MaNelson Chimes.D.   On: 02/05/2020 15:19   MR BRAIN W WO CONTRAST  Result Date: 02/02/2020 CLINICAL DATA:  Seizure.  Abnormal CT head. EXAM: MRI HEAD WITHOUT AND WITH CONTRAST TECHNIQUE: Multiplanar, multiecho pulse sequences of the brain and surrounding structures were obtained without and with intravenous contrast. CONTRAST:  33m14mADAVIST GADOBUTROL 1 MMOL/ML IV SOLN COMPARISON:  CT head 02/02/2020 FINDINGS: Brain: Peripheral enhancing mass lesion in the right middle frontal lobe laterally with central necrosis and surrounding edema. The enhancing mass measures up to 15 mm in diameter. This is peripherally located. Second peripherally enhancing mass lesion in the right posterior  temporal lobe measures 9 mm in diameter with surrounding vasogenic edema. No third lesion identified. Negative for acute infarct. Scattered small white matter hyperintensities compatible with mild chronic microvascular ischemia. Negative for hydrocephalus or hemorrhage. Vascular: Normal arterial flow voids Skull and upper cervical spine: No focal skeletal lesion. Sinuses/Orbits: Mild mucosal edema paranasal sinuses. Bilateral cataract extraction Other: None IMPRESSION: Two enhancing mass lesions are present in the right cerebral hemisphere with surrounding edema and central necrosis compatible with metastatic disease. No midline shift Mild chronic microvascular ischemic change in the  white matter. No acute infarct. Electronically Signed   By: Franchot Gallo M.D.   On: 02/02/2020 11:22   CT CHEST ABDOMEN PELVIS W CONTRAST  Result Date: 02/02/2020 CLINICAL DATA:  74 year old female with abnormal brain MRI and CT today compatible with cerebral metastases. Unknown primary. History of left oophorectomy for nonfunctioning left kidney. EXAM: CT CHEST, ABDOMEN, AND PELVIS WITH CONTRAST TECHNIQUE: Multidetector CT imaging of the chest, abdomen and pelvis was performed following the standard protocol during bolus administration of intravenous contrast. CONTRAST:  175m OMNIPAQUE IOHEXOL 300 MG/ML  SOLN COMPARISON:  Brain MRI today. [facility] CT Abdomen and Pelvis 07/28/2019 and earlier. FINDINGS: CT CHEST FINDINGS Cardiovascular: Calcified aortic atherosclerosis. Calcified coronary artery atherosclerosis. No cardiomegaly or pericardial effusion. Mediastinum/Nodes: Left upper lung mass inseparable from the mediastinum including the aortic arch (series 5, image 84). But mediastinal lymph nodes remains small, up to 6 mm short axis. Lungs/Pleura: Lobulated up to 8 cm mass of the medial left upper lung inseparable from the anterior lung apex and mediastinum. No associated left pleural effusion. There is  contralateral right lower lobe small but spiculated nodule measuring about 11 mm (series 5, image 102). There are also two smaller sub solid nodules in the right upper lobe on series 4, image 48 and image 70. Superimposed scattered bilateral subpleural pulmonary scarring. Musculoskeletal: Stable mild T4 superior endplate compression. No acute or suspicious osseous lesion identified in the chest. CT ABDOMEN PELVIS FINDINGS Hepatobiliary: Hepatic steatosis and mildly nodular liver contour (series 2, image 55) suspicious for cirrhosis. No discrete liver lesion. Negative gallbladder. Pancreas: Partially atrophied but otherwise negative. Spleen: Negative.  Incidental splenule (normal variant). Adrenals/Urinary Tract: Normal adrenal glands. Surgically absent left kidney as before. Right renal enhancement and contrast excretion is preserved. An anterior midpole exophytic low-density renal mass measures up to 3.8 cm diameter now, versus 2.2 cm in 2017. This has low to intermediate density by CT. On 2018 renal ultrasound this appeared simple and benign. Negative right ureter. Diminutive and unremarkable urinary bladder. Stomach/Bowel: Diverticulosis of the sigmoid colon. Oral contrast has reached the splenic flexure without obstruction. Decompressed ascending and proximal transverse colon. Negative appendix. Decompressed terminal ileum. No dilated small bowel. Unremarkable stomach. Chronic duodenal diverticulum near the midline appears inconsequential. No free air. No free fluid. Vascular/Lymphatic: Extensive Aortoiliac calcified atherosclerosis. Major arterial structures remain patent. Portal venous system is patent. No lymphadenopathy. Reproductive: Surgically absent uterus. Ovaries are within normal limits on series 2, image 105). Other: No pelvic free fluid. Small umbilical hernia with trace fluid is stable. Musculoskeletal: Advanced lumbar spine degeneration including grade 1 spondylolisthesis in the lower lumbar spine  with bulky facet hypertrophy. No acute or suspicious osseous lesion identified. There is a chronic oval mixed density soft tissue mass along the anterior lower sacrum which has only mildly increased in size since 2017, measuring up to 4 cm diameter now on series 2, image 102. This is stable since March. IMPRESSION: 1. Positive for an 8 cm left upper lung mass inseparable from the mediastinum compatible with Bronchogenic Carcinoma. 2. Mediastinal lymph nodes remain normal by size criteria. There are three indeterminate small but spiculated right lung nodules, the largest measuring 11 mm in the lower lobe. 3. No metastatic disease identified in the abdomen or pelvis. 4. Suspicion of Cirrhosis with hepatic steatosis. 5. Chronic presacral soft tissue mass favored to be benign myelolipoma. Chronic right renal cyst which had a benign ultrasound appearance in 2018. Sigmoid diverticulosis. Duodenal diverticulum. 6. Calcified coronary artery and  Aortic atherosclerosis (ICD10-I70.0). Electronically Signed   By: Genevie Ann M.D.   On: 02/02/2020 20:54   DG C-Arm 1-60 Min-No Report  Result Date: 02/21/2020 Fluoroscopy was utilized by the requesting physician.  No radiographic interpretation.   CT HEAD CODE STROKE WO CONTRAST  Result Date: 02/02/2020 CLINICAL DATA:  Code stroke. Slurred speech and left-sided numbness. EXAM: CT HEAD WITHOUT CONTRAST TECHNIQUE: Contiguous axial images were obtained from the base of the skull through the vertex without intravenous contrast. COMPARISON:  None. FINDINGS: Brain: 2 areas moderate edema in the right cerebral hemisphere, lateral frontal and temporoparietal. Although both in the MCA distribution these show preserved cortex, more suggestive of vasogenic than cytotoxic edema. No acute hemorrhage, hydrocephalus, or shift. Vascular: No hyperdense vessel. Skull: Normal. Negative for fracture or focal lesion. Sinuses/Orbits: Bilateral cataract resection Other: Critical Value/emergent  results were called by telephone at the time of interpretation on 02/02/2020 at 9:16 am to provider Merlyn Lot , who verbally acknowledged these results. ASPECTS [facility] Stroke Program Early CT Score) Not scored given the above IMPRESSION: Two areas of edema in the right cerebral hemisphere. Although both are located in the MCA distribution, these have the appearance of vasogenic edema - more or worrisome for mass lesion than infarct. Recommend brain MRI with contrast. Electronically Signed   By: Monte Fantasia M.D.   On: 02/02/2020 09:19    ASSESSMENT & PLAN:   Primary cancer of left upper lobe of lung ([place]) # Left lung mass/invading the mediastinum highly suspicious for lung cancer-s/p bronchoscopy [10/20]-non-small cell carcinoma/adenocarcinoma.  Stage IV [brain metastases]; awaiting on NGS.  We will get PET scan for evaluation [in 3 weeks; poor blood glucose control].   #I reviewed the pathology and stage with the patient and family in detail.  Discussed given brain metastases patient is not a candidate for any surgery.  Discussed treatment options are palliative not curative.  D discussed treatment options including-targeted therapy [if mutations present]; immunotherapy/keytruda [based on PDL status]; chemoimmunotherapy [carbo-alimta-Keytruda].  Also discussed the potential side side effects especially-given her solitary kidney [see below].  Await NGS; left a message for pathology regarding adequacy of tissue.  If enough tissue not available would recommend-liquid biopsy.  Also discussed limitations of liquid biopsy.   #Seizure secondary to brain metastases/edema x2 [10 to 15 millimeters]; s/p SBRT 10/13; currently on dexamethasone 4 mg BID- except for poorly controlled blood glucose/leg swelling [see below]. Taper steroids; 74m twice daily for 1 week; then 2 mg once a day for 1 week and stop.  We plan to get brain MRI 8 to 12 weeks.  # DM- poorly controlled- as per pt 400 at home; patient  awaiting to start insulin long-acting as per PCP.  Discussed with Dr. TDerrel Nip aware of steroid taper.  #Solitary kidney-normal renal function.  Stable.  However discussed concerns for renal dysfunction while on treatment.  We will try to adjust the treatments least nephrotoxic.  #Bilateral leg swelling-secondary steroids.  No concerns for DVTs.  Recommend leg elevation/steroid taper.  #Intermittent hemoptysis-streaks of blood with sputum.  Question bronchoscopy versus tumor related.  Will discuss with Dr.Aleskerov.   #Prognosis: Discussed the median survival of stage IV lung cancer brain metastases [1 to 3 years]; again understands survival is not patient specific.  Also discussed multiple factors including response to therapy; tolerance to therapy; performance status would impact patient specific survival.  For now await NGS.  I spoke at length with the patient's family [daughter and son]- regarding the patient's clinical  status/plan of care.  Family agreement.    # DISPOSITION: # Follow up in 3 weeks; MD- labs- cbc/cmp; PET prior- Dr.B  # 40 minutes face-to-face with the patient discussing the above plan of care; more than 50% of time spent on prognosis/ natural history; counseling and coordination.   All questions were answered. The patient knows to call the clinic with any problems, questions or concerns.    [name], MD 02/27/2020 8:43 AM

## 2020-02-26 NOTE — Telephone Encounter (Signed)
Patient needs to be scheduled ASAP for RN visit to learn how to administer Lantus insulin.  The   rx has been sent to pharmacy

## 2020-02-26 NOTE — Patient Instructions (Signed)
#   START TO TAPER dexamethasone- 2 mg twice a day x 1 week; 2 mg once a day x 1 week; and then STOP.

## 2020-02-26 NOTE — Progress Notes (Signed)
Pt and children in for follow up today.  Pt states Dr Derrel Nip started on Insulin as of today.  Pt reports having some blood tinged sputum at times.

## 2020-02-26 NOTE — Assessment & Plan Note (Addendum)
#   Left lung mass/invading the mediastinum highly suspicious for lung cancer-s/p bronchoscopy [10/20]-non-small cell carcinoma/adenocarcinoma.  Stage IV [brain metastases]; awaiting on NGS.  We will get PET scan for evaluation [in 3 weeks; poor blood glucose control].   #I reviewed the pathology and stage with the patient and family in detail.  Discussed given brain metastases patient is not a candidate for any surgery.  Discussed treatment options are palliative not curative.  D discussed treatment options including-targeted therapy [if mutations present]; immunotherapy/keytruda [based on PDL status]; chemoimmunotherapy [carbo-alimta-Keytruda].  Also discussed the potential side side effects especially-given her solitary kidney [see below].  Await NGS; left a message for pathology regarding adequacy of tissue.  If enough tissue not available would recommend-liquid biopsy.  Also discussed limitations of liquid biopsy.   #Seizure secondary to brain metastases/edema x2 [10 to 15 millimeters]; s/p SBRT 10/13; currently on dexamethasone 4 mg BID- except for poorly controlled blood glucose/leg swelling [see below]. Taper steroids; 63m twice daily for 1 week; then 2 mg once a day for 1 week and stop.  We plan to get brain MRI 8 to 12 weeks.  # DM- poorly controlled- as per pt 400 at home; patient awaiting to start insulin long-acting as per PCP.  Discussed with Dr. TDerrel Nip aware of steroid taper.  #Solitary kidney-normal renal function.  Stable.  However discussed concerns for renal dysfunction while on treatment.  We will try to adjust the treatments least nephrotoxic.  #Bilateral leg swelling-secondary steroids.  No concerns for DVTs.  Recommend leg elevation/steroid taper.  #Intermittent hemoptysis-streaks of blood with sputum.  Question bronchoscopy versus tumor related.  Will discuss with Dr.Aleskerov.   #Prognosis: Discussed the median survival of stage IV lung cancer brain metastases [1 to 3 years];  again understands survival is not patient specific.  Also discussed multiple factors including response to therapy; tolerance to therapy; performance status would impact patient specific survival.  For now await NGS.  I spoke at length with the patient's family [daughter and son]- regarding the patient's clinical status/plan of care.  Family agreement.    # DISPOSITION: # Follow up in 3 weeks; MD- labs- cbc/cmp; PET prior- Dr.B  # 40 minutes face-to-face with the patient discussing the above plan of care; more than 50% of time spent on prognosis/ natural history; counseling and coordination.

## 2020-02-27 ENCOUNTER — Other Ambulatory Visit: Payer: Self-pay | Admitting: Internal Medicine

## 2020-02-27 DIAGNOSIS — C7931 Secondary malignant neoplasm of brain: Secondary | ICD-10-CM

## 2020-02-27 LAB — CEA: CEA: 19.4 ng/mL — ABNORMAL HIGH (ref 0.0–4.7)

## 2020-02-27 NOTE — Addendum Note (Signed)
Addended by: Delice Bison E on: 02/27/2020 10:04 AM   Modules accepted: Orders

## 2020-02-27 NOTE — Telephone Encounter (Signed)
Spoke with pt yesterday and scheduled her for a nurse visit and advised her that when she gets the lantus rx it is going to say to take 20 units but Dr. Derrel Nip wants to to only take 15 units. Dr. Derrel Nip spoke with pt's oncologist and he stated to her that he was decreasing the pt's prednisone so that is why the insulin dose was decreased. Pt gave a verbal understanding.

## 2020-02-28 ENCOUNTER — Ambulatory Visit (INDEPENDENT_AMBULATORY_CARE_PROVIDER_SITE_OTHER): Payer: PPO

## 2020-02-28 ENCOUNTER — Encounter: Payer: Self-pay | Admitting: *Deleted

## 2020-02-28 ENCOUNTER — Other Ambulatory Visit: Payer: Self-pay

## 2020-02-28 DIAGNOSIS — E119 Type 2 diabetes mellitus without complications: Secondary | ICD-10-CM

## 2020-02-28 MED ORDER — INSULIN PEN NEEDLE 32G X 6 MM MISC: 1.0000 | Freq: Every day | 1 refills | Status: DC

## 2020-02-28 NOTE — Progress Notes (Addendum)
Patient presented for insulin teaching. Per Providers order from 02-27-20. Patient voiced no concerns nor showed signs of distress during injection. Patient was instructed on how to give herself the injection. Instructed patient that if further questions, they can call clinic to be given further direction.    I have reviewed the above information and agree with above.   Deborra Medina, MD

## 2020-02-28 NOTE — Progress Notes (Signed)
  Oncology Nurse Navigator Documentation  Navigator Location: [facility] (02/28/20 0800)   )Navigator Encounter Type: Appt/Treatment Plan Review (02/28/20 0800)     Confirmed Diagnosis Date: 02/23/20 (02/28/20 0800)                 Treatment Phase: Pre-Tx/Tx Discussion (02/28/20 0800) Barriers/Navigation Needs: No Barriers At This Time;No Needs (02/28/20 0800)   Interventions: None Required (02/28/20 0800)           Appt/treatment plan review. Will follow up with patient at next clinic visit.           Time Spent with Patient: 30 (02/28/20 0800)

## 2020-02-28 NOTE — Progress Notes (Signed)
Patient was noted to have streaks of blood on expectoration this week. She had lung biopsy done but states that cough with bloody streaks happened 1 week after procedure and she felt well even on day of procedure, was able to go out for coffee evening after procedure.  She feels well now and states the cough really happens when nasal drainage and post nasal drip irritates her. Will prescribe nasal atrovent 0.06% for now and re-check post PET.     Ottie Glazier, M.D.  [facility]ulmonary [facility] [place]

## 2020-02-29 ENCOUNTER — Telehealth: Payer: Self-pay | Admitting: Internal Medicine

## 2020-02-29 ENCOUNTER — Other Ambulatory Visit: Payer: PPO

## 2020-02-29 NOTE — Telephone Encounter (Signed)
Pt scheduled on 11/1 at 10:15am with Dr. Jacinto Reap and 11am with Dr. Baruch Gouty. Pt's daughter has been made aware.

## 2020-02-29 NOTE — Telephone Encounter (Signed)
On 10/27-spoke to patient's daughter [name] regarding patient's intermittent hemoptysis.  Discussed with Dr.Aleskerov; and also Dr. Donella Stade.  Also discussed the logistics of awaiting on NGS to start systemic therapy.  I again reached out to pathology regarding adequacy of tissue for NGS.  Recommend ASAP evaluation for palliative radiation LUL mass.   Hayley-please coordinate appointment with Dr. Donella Stade; and myself-[early next week/Nov 1st AM if possible].

## 2020-03-01 NOTE — Progress Notes (Signed)
Tumor Board Documentation  Kimberly Huff was presented by Dr Rogue Bussing at our Tumor Board on 02/29/2020, which included representatives from medical oncology, radiation oncology, surgical oncology, internal medicine, navigation, pathology, radiology, surgical, pharmacy, genetics, research, palliative care, pulmonology.  Kimberly Huff currently presents as a new patient, for [place], for new positive pathology with history of the following treatments: active survellience, neoadjuvant radiation.  Additionally, we reviewed previous medical and familial history, history of present illness, and recent lab results along with all available histopathologic and imaging studies. The tumor board considered available treatment options and made the following recommendations: Additional screening (PET Scan)    The following procedures/referrals were also placed: No orders of the defined types were placed in this encounter.   Clinical Trial Status: not discussed   Staging used: AJCC Stage Group  AJCC Staging: T: 2 N: 0 M: 1 Group: Stage 4 Adenocarcinoma of Lung   National site-specific guidelines NCCN were discussed with respect to the case.  Tumor board is a meeting of clinicians from various specialty areas who evaluate and discuss patients for whom a multidisciplinary approach is being considered. Final determinations in the plan of care are those of the provider(s). The responsibility for follow up of recommendations given during tumor board is that of the provider.   Today's extended care, comprehensive team conference, Kimberly Huff was not present for the discussion and was not examined.   Multidisciplinary Tumor Board is a multidisciplinary case peer review process.  Decisions discussed in the Multidisciplinary Tumor Board reflect the opinions of the specialists present at the conference without having examined the patient.  Ultimately, treatment and diagnostic decisions rest with the primary provider(s) and  the patient.

## 2020-03-04 ENCOUNTER — Inpatient Hospital Stay: Payer: PPO

## 2020-03-04 ENCOUNTER — Other Ambulatory Visit: Payer: Self-pay

## 2020-03-04 ENCOUNTER — Encounter: Payer: Self-pay | Admitting: *Deleted

## 2020-03-04 ENCOUNTER — Ambulatory Visit
Admission: RE | Admit: 2020-03-04 | Discharge: 2020-03-04 | Disposition: A | Discharge status: Home / Self Care | Payer: PPO | Source: Ambulatory Visit | Attending: Radiation Oncology | Admitting: Radiation Oncology

## 2020-03-04 ENCOUNTER — Encounter: Payer: Self-pay | Admitting: Internal Medicine

## 2020-03-04 ENCOUNTER — Inpatient Hospital Stay: Payer: PPO | Attending: Internal Medicine | Admitting: Internal Medicine

## 2020-03-04 DIAGNOSIS — I10 Essential (primary) hypertension: Secondary | ICD-10-CM | POA: Diagnosis not present

## 2020-03-04 DIAGNOSIS — R509 Fever, unspecified: Secondary | ICD-10-CM | POA: Insufficient documentation

## 2020-03-04 DIAGNOSIS — C7931 Secondary malignant neoplasm of brain: Secondary | ICD-10-CM | POA: Diagnosis not present

## 2020-03-04 DIAGNOSIS — C3412 Malignant neoplasm of upper lobe, left bronchus or lung: Secondary | ICD-10-CM | POA: Diagnosis not present

## 2020-03-04 DIAGNOSIS — C3492 Malignant neoplasm of unspecified part of left bronchus or lung: Secondary | ICD-10-CM | POA: Insufficient documentation

## 2020-03-04 DIAGNOSIS — E119 Type 2 diabetes mellitus without complications: Secondary | ICD-10-CM | POA: Insufficient documentation

## 2020-03-04 LAB — CBC WITH DIFFERENTIAL/PLATELET
Abs Immature Granulocytes: 0.09 10*3/uL — ABNORMAL HIGH (ref 0.00–0.07)
Basophils Absolute: 0 10*3/uL (ref 0.0–0.1)
Basophils Relative: 0 %
Eosinophils Absolute: 0.2 10*3/uL (ref 0.0–0.5)
Eosinophils Relative: 2 %
HCT: 41.8 % (ref 36.0–46.0)
Hemoglobin: 13.8 g/dL (ref 12.0–15.0)
Immature Granulocytes: 1 %
Lymphocytes Relative: 32 %
Lymphs Abs: 2.9 10*3/uL (ref 0.7–4.0)
MCH: 27.2 pg (ref 26.0–34.0)
MCHC: 33 g/dL (ref 30.0–36.0)
MCV: 82.3 fL (ref 80.0–100.0)
Monocytes Absolute: 0.4 10*3/uL (ref 0.1–1.0)
Monocytes Relative: 5 %
Neutro Abs: 5.5 10*3/uL (ref 1.7–7.7)
Neutrophils Relative %: 60 %
Platelets: 135 10*3/uL — ABNORMAL LOW (ref 150–400)
RBC: 5.08 MIL/uL (ref 3.87–5.11)
RDW: 15.6 % — ABNORMAL HIGH (ref 11.5–15.5)
WBC: 9.1 10*3/uL (ref 4.0–10.5)
nRBC: 0 % (ref 0.0–0.2)

## 2020-03-04 LAB — COMPREHENSIVE METABOLIC PANEL
ALT: 48 U/L — ABNORMAL HIGH (ref 0–44)
AST: 26 U/L (ref 15–41)
Albumin: 4 g/dL (ref 3.5–5.0)
Alkaline Phosphatase: 33 U/L — ABNORMAL LOW (ref 38–126)
Anion gap: 10 (ref 5–15)
BUN: 32 mg/dL — ABNORMAL HIGH (ref 8–23)
CO2: 26 mmol/L (ref 22–32)
Calcium: 9.3 mg/dL (ref 8.9–10.3)
Chloride: 98 mmol/L (ref 98–111)
Creatinine, Ser: 1.08 mg/dL — ABNORMAL HIGH (ref 0.44–1.00)
GFR, Estimated: 53 mL/min — ABNORMAL LOW (ref >60)
Glucose, Bld: 118 mg/dL — ABNORMAL HIGH (ref 70–99)
Potassium: 4 mmol/L (ref 3.5–5.1)
Sodium: 134 mmol/L — ABNORMAL LOW (ref 135–145)
Total Bilirubin: 1.1 mg/dL (ref 0.3–1.2)
Total Protein: 6.7 g/dL (ref 6.5–8.1)

## 2020-03-04 NOTE — Progress Notes (Signed)
  Oncology Nurse Navigator Documentation  Navigator Location: [facility] (03/04/20 1300)   )Navigator Encounter Type: Follow-up Appt (03/04/20 1300)                     Patient Visit Type: MedOnc;RadOnc (03/04/20 1300)   Barriers/Navigation Needs: Coordination of Care;Education (03/04/20 1300) Education: Newly Diagnosed Cancer Education;Understanding Cancer/ Treatment Options (03/04/20 1300) Interventions: Coordination of Care;Education (03/04/20 1300)   Coordination of Care: Appts (03/04/20 1300) Education Method: Verbal;Written (03/04/20 1300)       met with patient and her daughter during follow up visit today. All questions answered during visit. Pt given resources regarding diagnosis and supportive services available. Reviewed upcoming appts. Instructed to call back with any further questions or needs. Pt verbalized understanding.         Time Spent with Patient: 60 (03/04/20 1300)

## 2020-03-04 NOTE — Progress Notes (Signed)
Radiation Oncology Follow up Note OP NA lung mass  Name: Kimberly Huff   Date:   03/04/2020 MRN:  [id number] DOB: Jan 06, 1946    This 74 y.o. female presents to the clinic today for evaluation of stage IV lung cancer previously treated with SRS for brain metastasis now with hemoptysis and progressive disease in her chest.  Non-small cell lung cancer favoring adenocarcinoma  REFERRING PROVIDER: [name], MD  HPI: Patient is a 74 year old female well-known to our department having completed SRS for 2 separate brain metastasis for stage IV adenocarcinoma of the left lung.  Patient has been having some hemoptysis although none in the last week.  She is having a liquid biopsy today for consideration of immunotherapy options.  Patient is also be scheduled for a PET CT scan next week..  We discussed her case at tumor board she does have a solitary kidney also has problems with poor blood glucose control making chemotherapy a difficult option.  I am evaluating her today for radiation therapy to her chest.  She is otherwise doing fairly well specifically not significant chest pain cough or any other areas of bone pain.  She tolerated her SRS extremely well no side effects or complaints no headaches or any change in neurologic status.  COMPLICATIONS OF TREATMENT: none  FOLLOW UP COMPLIANCE: keeps appointments   PHYSICAL EXAM:  There were no vitals taken for this visit. Well-developed well-nourished patient in NAD. HEENT reveals PERLA, EOMI, discs not visualized.  Oral cavity is clear. No oral mucosal lesions are identified. Neck is clear without evidence of cervical or supraclavicular adenopathy. Lungs are clear to A&P. Cardiac examination is essentially unremarkable with regular rate and rhythm without murmur rub or thrill. Abdomen is benign with no organomegaly or masses noted. Motor sensory and DTR levels are equal and symmetric in the upper and lower extremities. Cranial nerves II through XII are  grossly intact. Proprioception is intact. No peripheral adenopathy or edema is identified. No motor or sensory levels are noted. Crude visual fields are within normal range.  RADIOLOGY RESULTS: CT scans reviewed PET CT scan to be reviewed  PLAN: At this time elect to go ahead with radiation therapy to her left lung mass.  I would like to see this PET CT scan prior to CT simulation.  I have ordered CT simulation the day after her PET CT scan.  Would plan on delivering 66 Gray over 7 weeks to her chest.  Would probably use a 3-dimensional hybrid technique and spare critical structures such as normal lung volume spinal cord heart and esophagus.  Risks and benefits of treatment including dysphagia cough fatigue alteration of blood counts possible loss of normal lung volume all were described in detail to the patient and her daughter both seem to comprehend my treatment plan well.  I have personally set up and ordered CT simulation after her PET CT scan.  She will go ahead with her l liquid biopsy today.  I would like to take this opportunity to thank you for allowing me to participate in the care of your patient.[name], MD

## 2020-03-04 NOTE — Assessment & Plan Note (Addendum)
#  Non-small cell lung cancer/adenocarcinoma-  left lung mass/invading the mediastinum; Stage IV [brain metastases]; QNS for NGS.   will get PET scan for evaluation next week- esp. Back pain [see below]; discussed regarding liquid biopsy; will order today. Also order PD-L1 on tissue [discussed with Dr. Reuel Derby; tumor conference]  #Discussed that while awaiting systemic therapy options I think it is reasonable to proceed with palliative radiation to the left upper lobe mass especially given the recent hemoptysis. Patient is appointment with Dr. Donella Stade later today. We will hold off adding any weekly chemotherapy. Discussed with Dr. Donella Stade; and Dr.Aleskerov, pulmonary.  #Seizure secondary to brain metastases/edema x2 [10 to 15 millimeters]; s/p SBRT 10/13; currently on dexamethasone [on taper] 2 mg once a day for 1 week and stop.  We plan to get brain MRI 8 to 12 weeks.  # DM- poorly controlled- as per pt 20ss at home; on  insulin long-acting as per PCP; steroid taper.   # Solitary kidney-normal renal function. STABLE.   #Bilateral leg swelling-stable.-secondary steroidsRecommend leg elevation/compression/steroid taper.   # DISPOSITION: # labs- gardant testin; CBC/CMP- # follow up as planned- Dr.B

## 2020-03-04 NOTE — Progress Notes (Signed)
[facility] CONSULT NOTE  Patient Care Team: [name], MD as PCP - General (Internal Medicine) [name], MD (General Surgery) [name], MD (Internal Medicine) Telford Nab, RN as Oncology Nurse Navigator  CHIEF COMPLAINTS/PURPOSE OF CONSULTATION:  Brain metastases  #  Oncology History Overview Note  # OCT 2021-left upper lobe lung mass invading mediastinum; bronc/Bx-  Oct 20th [Dr.Aleskerov]; non-small cell carcinoma/adenocarcinoma.  Stage IV [brain metastases]; awaiting on NGS.  #Right frontal/temporal brain met x2 [1-1.8m]-seizure- on dex; Keppra; SBRT s/p Oct 13th, 2021  # DM on OHA; HTN; SOLITARY KIDNEY [frequent infections/ atrophic- s/p nephrectomy 2020]  # NGS/MOLECULAR TESTS:Pending    # PALLIATIVE CARE EVALUATION:  # PAIN MANAGEMENT:    DIAGNOSIS: Lung cancer  STAGE:   IV      ;  GOALS: Palliative  CURRENT/MOST RECENT THERAPY :     Brain metastasis ([facility]  02/02/2020 Initial Diagnosis   Brain metastasis ([facility]   Primary cancer of left upper lobe of lung ([facility]  02/26/2020 Initial Diagnosis   Primary cancer of left upper lobe of lung ([facility])    HISTORY OF PRESENTING ILLNESS:  Kimberly [id number].[id number].  female  patient with a history of solitary kidney; diabetes obesity; prior history of smoking; in left upper lobe lung mass; brain metastasis status post SBRT is here for follow-up.  Patient denies any further episodes of hemoptysis. However continues to complain of back pain in the last few days. She also complains of swelling in the legs.  Complains of moderate to severe fatigue.  Review of Systems  Constitutional: Positive for malaise/fatigue. Negative for chills, diaphoresis, fever and weight loss.  HENT: Negative for nosebleeds and sore throat.   Eyes: Negative for double vision.  Respiratory: Positive for hemoptysis. Negative for cough, sputum production and wheezing.   Cardiovascular: Positive for leg swelling.  Negative for chest pain, palpitations and orthopnea.  Gastrointestinal: Negative for abdominal pain, blood in stool, constipation, diarrhea, heartburn, melena, nausea and vomiting.  Genitourinary: Negative for dysuria, frequency and urgency.  Musculoskeletal: Negative for back pain and joint pain.  Skin: Negative.  Negative for itching and rash.  Neurological: Negative for dizziness, tingling, focal weakness, weakness and headaches.  Endo/Heme/Allergies: Does not bruise/bleed easily.  Psychiatric/Behavioral: Negative for depression. The patient is not nervous/anxious and does not have insomnia.      MEDICAL HISTORY:  Past Medical History:  Diagnosis Date  . Allergy   . Atrophic kidney    left  . Blood clot in vein 45 yrs ago   inabdoment after surgery  . Blood transfusion without reported diagnosis   . Cataract   . Complication of anesthesia   . Coronary artery disease   . Coronary atherosclerosis   . Diabetes mellitus   . Diverticulosis 2005   Dr SJamal Collin . Dyspnea    with exertion  . External hemorrhoids without mention of complication [id number] . GERD (gastroesophageal reflux disease)   . H/O cardiac catheterization 2005  . Heart murmur 2012   found by Dr. SJamal Collin . Hyperlipidemia   . Hypertension   . Kidney stone   . Lesion of bladder   . Myocardial infarction ([facility] 2000  . Neuromuscular disorder ([facility]    "achy muscles"  . Obesity   . Osteoarthritis   . PONV (postoperative nausea and vomiting)   . Recurrent UTI   . Seizure ([facility]   . Spinal stenosis of cervical region   . Steatohepatitis 05/30/10  Brunt Stage 3, non alcoholic cirrhosis of the liver  . Type II or unspecified type diabetes mellitus without mention of complication, uncontrolled    Patient does takes Metformin and recommended to stop for 48 hours.  Marland Kitchen Ulcer   . Unspecified essential hypertension 2014    SURGICAL HISTORY: Past Surgical History:  Procedure Laterality Date  . ABDOMINAL HYSTERECTOMY   1982   complete  . BREAST CYST ASPIRATION Right 1990's   benign  . BREAST MASS EXCISION     benign  . CARDIAC CATHETERIZATION  2000   cardiac stent  . cardiac stents  2000  . CATARACT EXTRACTION     both eyes  . COLONOSCOPY  2004, 2015   Dr. Jamal Collin, Dr. Olevia Perches  . CYSTOSCOPY W/ RETROGRADES Left 11/12/2015   Procedure: CYSTOSCOPY WITH RETROGRADE PYELOGRAM;  Surgeon: Cleon Gustin, MD;  Location: [facility];  Service: Urology;  Laterality: Left;  . CYSTOSCOPY W/ URETERAL STENT PLACEMENT Left 11/12/2015   Procedure: CYSTOSCOPY WITH STENT REPLACEMENT;  Surgeon: Cleon Gustin, MD;  Location: [facility];  Service: Urology;  Laterality: Left;  . CYSTOSCOPY WITH BIOPSY N/A 09/26/2019   Procedure: CYSTOSCOPY WITH BIOPSY AND FULGURATION;  Surgeon: Cleon Gustin, MD;  Location[facility];  Service: Urology;  Laterality: N/A;  . CYSTOSCOPY WITH URETEROSCOPY AND STENT PLACEMENT Left 10/15/2015   Procedure: CYSTOSCOPY WITH URETEROSCOPY AND STENT PLACEMENT;  Surgeon: Cleon Gustin, MD;  Location: [facility];  Service: Urology;  Laterality: Left;  . EYE SURGERY Bilateral 2012   cataract  . FOOT SURGERY  2008  . PTCA  2000   stent x 1  . ROBOTIC ASSITED PARTIAL NEPHRECTOMY Left 06/02/2019   Procedure: XI ROBOTIC ASSITED NEPHRECTOMY;  Surgeon: Cleon Gustin, MD;  Location: [facility];  Service: Urology;  Laterality: Left;  . SALPINGOOPHORECTOMY    . TONSILLECTOMY    . UPPER GI ENDOSCOPY    . URETEROSCOPY WITH HOLMIUM LASER LITHOTRIPSY Left 11/12/2015   Procedure: URETEROSCOPY WITH HOLMIUM LASER LITHOTRIPSY;  Surgeon: Cleon Gustin, MD;  Location: [facility];  Service: Urology;  Laterality: Left;  . URETEROTOMY  1960/1969   x 2 during childhood  . VAGINAL HYSTERECTOMY    . VIDEO BRONCHOSCOPY WITH ENDOBRONCHIAL NAVIGATION N/A 02/21/2020   Procedure: VIDEO BRONCHOSCOPY WITH ENDOBRONCHIAL NAVIGATION;  Surgeon: Ottie Glazier, MD;  Location: [facility];  Service: Thoracic;   Laterality: N/A;  . VIDEO BRONCHOSCOPY WITH ENDOBRONCHIAL ULTRASOUND N/A 02/21/2020   Procedure: VIDEO BRONCHOSCOPY WITH ENDOBRONCHIAL ULTRASOUND;  Surgeon: Ottie Glazier, MD;  Location: [facility];  Service: Thoracic;  Laterality: N/A;    SOCIAL HISTORY: Social History   Socioeconomic History  . Marital status: Single    Spouse name: Not on file  . Number of children: 2  . Years of education: Not on file  . Highest education level: Not on file  Occupational History  . 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 Smoking status: Former Smoker    Packs/day: 0.50    Years: 38.00    Pack years: 19.00    Types: Cigarettes    Quit date: 09/15/1998    Years since quitting: 21.4  . Smokeless tobacco: Never Used  Vaping Use  . Vaping Use: Never used  Substance and Sexual Activity  . Alcohol use: Not Currently  . Drug use: No  . Sexual activity: Not Currently  Other Topics Concern  . Not on file  Social History Narrative  Lives in [place]; daughter lives in [place]. Quit smoking may 14th 2000. No alcohol. Retired  From [employer] job.    Social Determinants of Health   Financial Resource Strain: Low Risk   . Difficulty of Paying Living Expenses: Not hard at all  Food Insecurity: No Food Insecurity  . Worried About Charity fundraiser in the Last Year: Never true  . Ran Out of Food in the Last Year: Never true  Transportation Needs: No Transportation Needs  . Lack of Transportation (Medical): No  . Lack of Transportation (Non-Medical): No  Physical Activity:   . Days of Exercise per Week: Not on file  . Minutes of Exercise per Session: Not on file  Stress: No Stress Concern Present  . Feeling of Stress : Not at all  Social Connections: Unknown  . Frequency of Communication with Friends and Family: More than three times a week  . Frequency of Social Gatherings with Friends and Family: More than three times a week  . Attends  Religious Services: More than 4 times per year  . Active Member of Clubs or Organizations: Yes  . Attends Archivist Meetings: More than 4 times per year  . Marital Status: Not on file  Intimate Partner Violence: Not At Risk  . Fear of Current or Ex-Partner: No  . Emotionally Abused: No  . Physically Abused: No  . Sexually Abused: No    FAMILY HISTORY: Family History  Problem Relation Age of Onset  . Lung cancer Father   . Heart disease Father   . Diverticulosis Mother   . Diabetes Mother   . Stroke Mother 10  . Autoimmune disease Daughter   . Thyroid disease Daughter   . Thyroid disease Sister   . Stomach cancer Maternal Aunt   . Breast cancer Maternal Aunt   . Colon cancer Neg Hx   . Esophageal cancer Neg Hx   . Rectal cancer Neg Hx     ALLERGIES:  has No Known Allergies.  MEDICATIONS:  Current Outpatient Medications  Medication Sig Dispense Refill  . denosumab (PROLIA) 60 MG/ML SOSY injection Inject into the skin.    Marland Kitchen dexamethasone (DECADRON) 2 MG tablet Take 1 tablet (2 mg total) by mouth 2 (two) times daily. X 1 week; and then 1 pill every day. 21 tablet 0  . furosemide (LASIX) 40 MG tablet TAKE ONE TABLET EVERY DAY (Patient taking differently: Take 40 mg by mouth daily. ) 90 tablet 1  . Insulin Pen Needle 32G X 6 MM MISC 1 Stick by Does not apply route daily. 100 each 1  . LANTUS SOLOSTAR 100 UNIT/ML Solostar Pen INJECT 20 UNITS INTO THE SKIN DAILY AS DIRECTED 15 mL PRN  . levETIRAcetam (KEPPRA) 500 MG tablet Take 1 tablet (500 mg total) by mouth 2 (two) times daily. 60 tablet 0  . losartan (COZAAR) 50 MG tablet TAKE ONE TABLET EVERY DAY (Patient taking differently: Take 50 mg by mouth every morning. ) 90 tablet 3  . omeprazole (PRILOSEC) 40 MG capsule TAKE 1 CAPSULE BY MOUTH ONCE DAILY (Patient taking differently: Take 40 mg by mouth every morning. ) 90 capsule 1  . ONETOUCH ULTRA test strip USE AS INSTRUCTED 100 each 12  . rosuvastatin (CRESTOR) 10 MG  tablet TAKE ONE TABLET EVERY DAY (Patient taking differently: Take 10 mg by mouth at bedtime. ) 90 tablet 1  . senna-docusate (SENOKOT-S) 8.6-50 MG tablet Take 2 tablets by mouth daily as needed for mild constipation. [place]  tablet 1  . Vitamin D, Ergocalciferol, (DRISDOL) 1.25 MG (50000 UNIT) CAPS capsule TAKE ONE CAPSULE EACH WEEK (Patient taking differently: Take 50,000 Units by mouth every 7 (seven) days. THURSDAYS) 4 capsule 11   No current facility-administered medications for this visit.      Marland Kitchen  PHYSICAL EXAMINATION: ECOG PERFORMANCE STATUS: 1 - Symptomatic but completely ambulatory  Vitals:   03/04/20 1016  BP: 119/71  Pulse: 76  Resp: 16  Temp: 98.6 F (37 C)  SpO2: 99%   Filed Weights   03/04/20 1016  Weight: 181 lb (82.1 kg)    Physical Exam Vitals and nursing note reviewed.  Constitutional:      Comments: Patient is alone.  She is walking independently.  HENT:     Head: Normocephalic and atraumatic.     Mouth/Throat:     Pharynx: No oropharyngeal exudate.  Eyes:     Pupils: Pupils are equal, round, and reactive to light.  Cardiovascular:     Rate and Rhythm: Normal rate and regular rhythm.  Pulmonary:     Effort: No respiratory distress.     Breath sounds: No wheezing.  Abdominal:     General: Bowel sounds are normal. There is no distension.     Palpations: Abdomen is soft. There is no mass.     Tenderness: There is no abdominal tenderness. There is no guarding or rebound.  Musculoskeletal:        General: No tenderness. Normal range of motion.     Cervical back: Normal range of motion and neck supple.  Skin:    General: Skin is warm.  Neurological:     Mental Status: She is alert and oriented to person, place, and time.  Psychiatric:        Mood and Affect: Affect normal.      LABORATORY DATA:  I have reviewed the data as listed Lab Results  Component Value Date   WBC 9.1 03/04/2020   HGB 13.8 03/04/2020   HCT 41.8 03/04/2020   MCV 82.3  03/04/2020   PLT 135 (L) 03/04/2020   Recent Labs    06/03/19 0434 06/29/19 0819 02/02/20 0925 02/02/20 0925 02/03/20 0541 02/09/20 1002 02/26/20 1407 03/04/20 1113  NA 136   < > 137   < > 133* 136 129* 134*  K 4.2   < > 4.2   < > 4.3 3.9 3.8 4.0  CL 104   < > 100   < > 99 99 96* 98  CO2 26   < > 27   < > 24 26 24 26   GLUCOSE 125*   < > 177*   < > 248* 214* 443* 118*  BUN 11   < > 16   < > 18 42* 30* 32*  CREATININE 0.62   < > 0.81   < > 0.78 0.89 0.93 1.08*  CALCIUM 9.0   < > 9.6   < > 9.2 9.3 8.0* 9.3  GFRNONAA >60  --  >60   < > >60 >60 >60 53*  GFRAA >60  --  >60  --  >60  --   --   --   PROT  --    < > 7.7   < >  --  7.4 6.2* 6.7  ALBUMIN  --    < > 4.5   < >  --  4.3 3.7 4.0  AST  --    < > 29   < >  --  22 18 26   ALT  --    < > 31   < >  --  41 38 48*  ALKPHOS  --    < > 43   < >  --  43 37* 33*  BILITOT  --    < > 0.9   < >  --  1.0 1.0 1.1   < > = values in this interval not displayed.    RADIOGRAPHIC STUDIES: I have personally reviewed the radiological images as listed and agreed with the findings in the report. CT CHEST WO CONTRAST  Result Date: 02/19/2020 CLINICAL DATA:  74 year old female with stage IV lung cancer. Follow-up CT. EXAM: CT CHEST WITHOUT CONTRAST TECHNIQUE: Multidetector CT imaging of the chest was performed following the standard protocol without IV contrast. COMPARISON:  Chest CT dated 02/02/2020. FINDINGS: Evaluation of this exam is limited in the absence of intravenous contrast. Cardiovascular: There is no cardiomegaly or pericardial effusion. Three-vessel coronary vascular calcification. Top-normal ascending aorta measure up to 3.9 cm. There is moderate atherosclerotic calcification of the thoracic aorta. The central pulmonary arteries are grossly unremarkable. Mediastinum/Nodes: No hilar or mediastinal adenopathy. Esophagus and the thyroid gland are grossly unremarkable. No mediastinal fluid collection. Lungs/Pleura: Left upper lobe lobulated mass  abutting the mediastinal pleural measures approximately 6.6 x 3.4 cm (previously 7.5 x 4.4 cm measured in similar plane). A 5 mm nodule in the left upper lobe (55/3) appears similar to prior CT. There is a 13 mm nodule in the right lower lobe along the fissure, similar to prior CT. A cluster of nodular density in the right upper lobe measure approximately 1 cm in combined dimension (previously 1.2 cm). No consolidative changes. There is no pleural effusion pneumothorax. The central airways are patent. Upper Abdomen: Slight irregularity of the liver contour, likely early changes of cirrhosis. Trace free fluid adjacent to the spleen. Musculoskeletal: Osteopenia with degenerative changes of the spine. No acute osseous pathology. IMPRESSION: 1. Interval decrease in the size of the left upper lobe lobulated mass. 2. Bilateral pulmonary nodules as described above, similar to prior CT. 3. No hilar or mediastinal adenopathy. 4. Aortic Atherosclerosis (ICD10-I70.0). Electronically Signed   By: Anner Crete M.D.   On: 02/19/2020 20:37   MR BRAIN W WO CONTRAST  Result Date: 02/05/2020 CLINICAL DATA:  Brain mass or lesion.  Recent seizure. EXAM: MRI HEAD WITHOUT AND WITH CONTRAST TECHNIQUE: Multiplanar, multiecho pulse sequences of the brain and surrounding structures were obtained without and with intravenous contrast. CONTRAST:  82m GADAVIST GADOBUTROL 1 MMOL/ML IV SOLN COMPARISON:  02/02/2020 FINDINGS: Brain: Diffusion imaging does not show any acute or subacute infarction or other cause of restricted diffusion. No abnormality is seen affecting the brainstem or cerebellum. The left cerebral hemisphere shows mild to moderate chronic small-vessel ischemic changes of the white matter. No cortical or large vessel territory infarction. No evidence of mass or abnormal enhancement affecting the left hemisphere. On the right, there are 2 enhancing mass lesions with central necrosis. Right posterior temporal lobe lesion axial  image 75 measures 1 cm in diameter. Peripheral right frontal operculum lesion axial image 103 also show central necrosis, measuring up to 13 mm in diameter. Both are associated with vasogenic edema. No third lesion identified. Mild to moderate chronic small-vessel changes present within the white matter on the right. Vascular: Major vessels at the base of the brain show flow. Skull and upper cervical spine: Negative Sinuses/Orbits: Clear/normal Other: None IMPRESSION: 1. Two enhancing mass lesions with  central necrosis located in the right frontal operculum (13 mm) and right posterior temporal lobe (10 mm) consistent with metastatic disease. Surrounding vasogenic edema. No third lesion identified. No change since 3 days ago. 2. Mild to moderate chronic small-vessel ischemic changes of the cerebral hemispheric white matter. Electronically Signed   By: Nelson Chimes M.D.   On: 02/05/2020 15:19   DG C-Arm 1-60 Min-No Report  Result Date: 02/21/2020 Fluoroscopy was utilized by the requesting physician.  No radiographic interpretation.    ASSESSMENT & PLAN:   Primary cancer of left upper lobe of lung ([place]) #Non-small cell lung cancer/adenocarcinoma-  left lung mass/invading the mediastinum; Stage IV [brain metastases]; QNS for NGS.   will get PET scan for evaluation next week- esp. Back pain [see below]; discussed regarding liquid biopsy; will order today. Also order PD-L1 on tissue [discussed with Dr. Reuel Derby; tumor conference]  #Discussed that while awaiting systemic therapy options I think it is reasonable to proceed with palliative radiation to the left upper lobe mass especially given the recent hemoptysis. Patient is appointment with Dr. Donella Stade later today. We will hold off adding any weekly chemotherapy. Discussed with Dr. Donella Stade; and Dr.Aleskerov, pulmonary.  #Seizure secondary to brain metastases/edema x2 [10 to 15 millimeters]; s/p SBRT 10/13; currently on dexamethasone [on taper] 2 mg once a  day for 1 week and stop.  We plan to get brain MRI 8 to 12 weeks.  # DM- poorly controlled- as per pt 20ss at home; on  insulin long-acting as per PCP; steroid taper.   # Solitary kidney-normal renal function. STABLE.   #Bilateral leg swelling-stable.-secondary steroidsRecommend leg elevation/compression/steroid taper.   # DISPOSITION: # labs- gardant testin; CBC/CMP- # follow up as planned- Dr.B   All questions were answered. The patient knows to call the clinic with any problems, questions or concerns.    [name], MD 03/04/2020 4:04 PM

## 2020-03-06 ENCOUNTER — Other Ambulatory Visit: Payer: Self-pay | Admitting: *Deleted

## 2020-03-06 MED ORDER — LEVETIRACETAM 500 MG PO TABS
500.0000 mg | ORAL_TABLET | Freq: Two times a day (BID) | ORAL | 0 refills | Status: DC
Start: 2020-03-06 — End: 2020-04-09

## 2020-03-06 NOTE — Telephone Encounter (Signed)
Pt's daughter called in to ask if pt needs to continue taking Keppra and if so, pt needs a refill sent into her pharmacy.   Please advise.

## 2020-03-08 ENCOUNTER — Telehealth (INDEPENDENT_AMBULATORY_CARE_PROVIDER_SITE_OTHER): Payer: PPO | Admitting: Internal Medicine

## 2020-03-08 ENCOUNTER — Encounter: Payer: Self-pay | Admitting: Internal Medicine

## 2020-03-08 DIAGNOSIS — C7931 Secondary malignant neoplasm of brain: Secondary | ICD-10-CM

## 2020-03-08 DIAGNOSIS — E1121 Type 2 diabetes mellitus with diabetic nephropathy: Secondary | ICD-10-CM | POA: Diagnosis not present

## 2020-03-08 MED ORDER — METFORMIN HCL 850 MG PO TABS: ORAL_TABLET | ORAL | 1 refills | Status: DC

## 2020-03-08 MED ORDER — DEXAMETHASONE 2 MG PO TABS: 2.0000 mg | ORAL_TABLET | Freq: Every day | ORAL | 0 refills | Status: DC

## 2020-03-08 NOTE — Progress Notes (Signed)
Virtual Visit coverted t Telephone note  This visit type was conducted due to national recommendations for restrictions regarding the COVID-19 pandemic (e.g. social distancing).  This format is felt to be most appropriate for this patient at this time.  All issues noted in this document were discussed and addressed.  No physical exam was performed (except for noted visual exam findings with Video Visits).   I connected with@ on 03/08/20 at  8:00 AM EDT by a video enabled telemedicine applicationand verified that I am speaking with the correct person using two identifiers. Location patient: home Location provider: work or home office Persons participating in the virtual visit: patient, provider  I discussed the limitations, risks, security and privacy concerns of performing an evaluation and management service by telephone and the availability of in person appointments. I also discussed with the patient that there may be a patient responsible charge related to this service. The patient expressed understanding and agreed to proceed.  Interactive audio and video telecommunications were attempted between this provider and patient, however failed, due to patient having technical difficulties OR patient did not have access to video capability.  We continued and completed visit with audio only.   Reason for visit: diabetes follow up   HPI:    74 yr old female with Type 2 DM with recent loss of control due to initiation of steroid therapy following a seizure which led to diagnosis of  metastatic lung Ca to brain.  She has been using insulin for the last 2 weeks to manage BS that were > 300.  Her dose of steroids has been reduced and she is currently taking dexamethasone 2 mg daily In the morning , will finish all steroids on Sunday and will have  PET scan on Tuesday afternoon,.  Will be NPO for the procedure.   Taking 15 units Lantus and 850 mg bid metformin. fasting was 109 yesterday and 155 today  after eating KET[employer] ice cream the night before,    Insomnia.  Discussed adding melatonin at dinner time   Feet swelling .  Taking 40 mg lasix daily chronically was advised by Dr Clayborn Bigness not to increase dose    Back pain hurting again now that the high dose steroids are off.  Not taking tylenol or tramadol,  Steroids C/I  ROS: See pertinent positives and negatives per HPI.   ROS: See pertinent positives and negatives per HPI.  Past Medical History:  Diagnosis Date  . Allergy   . Atrophic kidney    left  . Blood clot in vein 45 yrs ago   inabdoment after surgery  . Blood transfusion without reported diagnosis   . Cataract   . Complication of anesthesia   . Coronary artery disease   . Coronary atherosclerosis   . Diabetes mellitus   . Diverticulosis 2005   Dr Jamal Collin  . Dyspnea    with exertion  . External hemorrhoids without mention of complication 8295  . GERD (gastroesophageal reflux disease)   . H/O cardiac catheterization 2005  . Heart murmur 2012   found by Dr. Jamal Collin  . Hyperlipidemia   . Hypertension   . Kidney stone   . Lesion of bladder   . Myocardial infarction ([place]) 2000  . Neuromuscular disorder ([place])    "achy muscles"  . Obesity   . Osteoarthritis   . PONV (postoperative nausea and vomiting)   . Recurrent UTI   . Seizure ([place])   . Spinal stenosis of cervical region   .  Steatohepatitis 05/30/10   Brunt Stage 3, non alcoholic cirrhosis of the liver  . Type II or unspecified type diabetes mellitus without mention of complication, uncontrolled    Patient does takes Metformin and recommended to stop for 48 hours.  Marland Kitchen Ulcer   . Unspecified essential hypertension 2014    Past Surgical History:  Procedure Laterality Date  . ABDOMINAL HYSTERECTOMY  1982   complete  . BREAST CYST ASPIRATION Right 1990's   benign  . BREAST MASS EXCISION     benign  . CARDIAC CATHETERIZATION  2000   cardiac stent  . cardiac stents  2000  . CATARACT EXTRACTION     both  eyes  . COLONOSCOPY  2004, 2015   Dr. Jamal Collin, Dr. Olevia Perches  . CYSTOSCOPY W/ RETROGRADES Left 11/12/2015   Procedure: CYSTOSCOPY WITH RETROGRADE PYELOGRAM;  Surgeon: Cleon Gustin, MD;  Location: [facility];  Service: Urology;  Laterality: Left;  . CYSTOSCOPY W/ URETERAL STENT PLACEMENT Left 11/12/2015   Procedure: CYSTOSCOPY WITH STENT REPLACEMENT;  Surgeon: Cleon Gustin, MD;  Location: [facility];  Service: Urology;  Laterality: Left;  . CYSTOSCOPY WITH BIOPSY N/A 09/26/2019   Procedure: CYSTOSCOPY WITH BIOPSY AND FULGURATION;  Surgeon: Cleon Gustin, MD;  Location: [facility];  Service: Urology;  Laterality: N/A;  . CYSTOSCOPY WITH URETEROSCOPY AND STENT PLACEMENT Left 10/15/2015   Procedure: CYSTOSCOPY WITH URETEROSCOPY AND STENT PLACEMENT;  Surgeon: Cleon Gustin, MD;  Location: [facility];  Service: Urology;  Laterality: Left;  . EYE SURGERY Bilateral 2012   cataract  . FOOT SURGERY  2008  . PTCA  2000   stent x 1  . ROBOTIC ASSITED PARTIAL NEPHRECTOMY Left 06/02/2019   Procedure: XI ROBOTIC ASSITED NEPHRECTOMY;  Surgeon: Cleon Gustin, MD;  Location: [facility];  Service: Urology;  Laterality: Left;  . SALPINGOOPHORECTOMY    . TONSILLECTOMY    . UPPER GI ENDOSCOPY    . URETEROSCOPY WITH HOLMIUM LASER LITHOTRIPSY Left 11/12/2015   Procedure: URETEROSCOPY WITH HOLMIUM LASER LITHOTRIPSY;  Surgeon: Cleon Gustin, MD;  Location: [facility];  Service: Urology;  Laterality: Left;  . URETEROTOMY  1960/1969   x 2 during childhood  . VAGINAL HYSTERECTOMY    . VIDEO BRONCHOSCOPY WITH ENDOBRONCHIAL NAVIGATION N/A 02/21/2020   Procedure: VIDEO BRONCHOSCOPY WITH ENDOBRONCHIAL NAVIGATION;  Surgeon: Ottie Glazier, MD;  Location: [facility];  Service: Thoracic;  Laterality: N/A;  . VIDEO BRONCHOSCOPY WITH ENDOBRONCHIAL ULTRASOUND N/A 02/21/2020   Procedure: VIDEO BRONCHOSCOPY WITH ENDOBRONCHIAL ULTRASOUND;  Surgeon: Ottie Glazier, MD;  Location: [facility];  Service:  Thoracic;  Laterality: N/A;    Family History  Problem Relation Age of Onset  . Lung cancer Father   . Heart disease Father   . Diverticulosis Mother   . Diabetes Mother   . Stroke Mother 52  . Autoimmune disease Daughter   . Thyroid disease Daughter   . Thyroid disease Sister   . Stomach cancer Maternal Aunt   . Breast cancer Maternal Aunt   . Colon cancer Neg Hx   . Esophageal cancer Neg Hx   . Rectal cancer Neg Hx     SOCIAL HX:  reports that she quit smoking about 21 years ago. Her smoking use included cigarettes. She has a 19.00 pack-year smoking history. She has never used smokeless tobacco. She reports previous alcohol use. She reports that she does not use drugs.   Current Outpatient Medications:  .  denosumab (PROLIA) 60 MG/ML SOSY injection, Inject into  the skin., Disp: , Rfl:  .  dexamethasone (DECADRON) 2 MG tablet, Take 1 tablet (2 mg total) by mouth daily., Disp: 2 tablet, Rfl: 0 .  furosemide (LASIX) 40 MG tablet, TAKE ONE TABLET EVERY DAY (Patient taking differently: Take 40 mg by mouth daily. ), Disp: 90 tablet, Rfl: 1 .  Insulin Pen Needle 32G X 6 MM MISC, 1 Stick by Does not apply route daily., Disp: 100 each, Rfl: 1 .  LANTUS SOLOSTAR 100 UNIT/ML Solostar Pen, INJECT 20 UNITS INTO THE SKIN DAILY AS DIRECTED (Patient taking differently: Inject 15 Units into the skin daily. ), Disp: 15 mL, Rfl: PRN .  levETIRAcetam (KEPPRA) 500 MG tablet, Take 1 tablet (500 mg total) by mouth 2 (two) times daily., Disp: 60 tablet, Rfl: 0 .  losartan (COZAAR) 50 MG tablet, TAKE ONE TABLET EVERY DAY (Patient taking differently: Take 50 mg by mouth every morning. ), Disp: 90 tablet, Rfl: 3 .  omeprazole (PRILOSEC) 40 MG capsule, TAKE 1 CAPSULE BY MOUTH ONCE DAILY (Patient taking differently: Take 40 mg by mouth every morning. ), Disp: 90 capsule, Rfl: 1 .  ONETOUCH ULTRA test strip, USE AS INSTRUCTED, Disp: 100 each, Rfl: 12 .  rosuvastatin (CRESTOR) 10 MG tablet, TAKE ONE TABLET EVERY  DAY (Patient taking differently: Take 10 mg by mouth at bedtime. ), Disp: 90 tablet, Rfl: 1 .  Vitamin D, Ergocalciferol, (DRISDOL) 1.25 MG (50000 UNIT) CAPS capsule, TAKE ONE CAPSULE EACH WEEK (Patient taking differently: Take 50,000 Units by mouth every 7 (seven) days. THURSDAYS), Disp: 4 capsule, Rfl: 11 .  metFORMIN (GLUCOPHAGE) 850 MG tablet, TAKE ONE TABLET TWICE DAILY WITH A MEAL, Disp: 180 tablet, Rfl: 1 .  senna-docusate (SENOKOT-S) 8.6-50 MG tablet, Take 2 tablets by mouth daily as needed for mild constipation. (Patient not taking: Reported on 03/08/2020), Disp: 30 tablet, Rfl: 1  EXAM:   General impression: alert, cooperative and articulate.  No signs of being in distress  Lungs: speech is fluent sentence length suggests that patient is not short of breath and not punctuated by cough, sneezing or sniffing. Marland Kitchen   Psych: affect normal.  speech is articulate and non pressured .  Denies suicidal thoughts     ASSESSMENT AND PLAN:  Discussed the following assessment and plan:  Brain metastasis ([place]) - Plan: dexamethasone (DECADRON) 2 MG tablet  Controlled type 2 diabetes mellitus with diabetic nephropathy, without long-term current use of insulin (HCC)  Brain metastasis (HCC) Continue Keppra.  Not driving given presentation with seizure.   Type 2 diabetes mellitus, controlled, with renal complications (HCC) Improving control with use of lantus 15 units,  Started for BS > 300 due to initiation of high dose steroids for brain mets  Lab Results  Component Value Date   HGBA1C 7.3 (H) 02/02/2020       I discussed the assessment and treatment plan with the patient. The patient was provided an opportunity to ask questions and all were answered. The patient agreed with the plan and demonstrated an understanding of the instructions.   The patient was advised to call back or seek an in-person evaluation if the symptoms worsen or if the condition fails to improve as anticipated.  I  provided 30  minutes of non-face-to-face time during this encounter.   [name], MD

## 2020-03-08 NOTE — Assessment & Plan Note (Signed)
Continue Keppra.  Not driving given presentation with seizure.

## 2020-03-08 NOTE — Assessment & Plan Note (Signed)
Improving control with use of lantus 15 units,  Started for BS > 300 due to initiation of high dose steroids for brain mets  Lab Results  Component Value Date   HGBA1C 7.3 (H) 02/02/2020

## 2020-03-08 NOTE — Patient Instructions (Signed)
Continue 15 units of Lantus daily and your current metformin dose  If your blood sugar drops below 100,  Reduce your insulin to 5 units daily.  If it drops below 80 , STOP the insulin

## 2020-03-10 ENCOUNTER — Encounter: Payer: Self-pay | Admitting: Internal Medicine

## 2020-03-10 DIAGNOSIS — C3412 Malignant neoplasm of upper lobe, left bronchus or lung: Secondary | ICD-10-CM | POA: Diagnosis not present

## 2020-03-12 ENCOUNTER — Telehealth: Payer: Self-pay | Admitting: *Deleted

## 2020-03-12 ENCOUNTER — Other Ambulatory Visit: Payer: Self-pay | Admitting: Internal Medicine

## 2020-03-12 ENCOUNTER — Encounter
Admission: RE | Admit: 2020-03-12 | Discharge: 2020-03-12 | Disposition: A | Discharge status: Home / Self Care | Payer: PPO | Source: Ambulatory Visit | Attending: Internal Medicine | Admitting: Internal Medicine

## 2020-03-12 ENCOUNTER — Other Ambulatory Visit: Payer: Self-pay

## 2020-03-12 DIAGNOSIS — M47816 Spondylosis without myelopathy or radiculopathy, lumbar region: Secondary | ICD-10-CM | POA: Diagnosis not present

## 2020-03-12 DIAGNOSIS — C3412 Malignant neoplasm of upper lobe, left bronchus or lung: Secondary | ICD-10-CM | POA: Insufficient documentation

## 2020-03-12 DIAGNOSIS — I712 Thoracic aortic aneurysm, without rupture: Secondary | ICD-10-CM | POA: Diagnosis not present

## 2020-03-12 DIAGNOSIS — I2510 Atherosclerotic heart disease of native coronary artery without angina pectoris: Secondary | ICD-10-CM | POA: Diagnosis not present

## 2020-03-12 LAB — GLUCOSE, CAPILLARY: Glucose-Capillary: 116 mg/dL — ABNORMAL HIGH (ref 70–99)

## 2020-03-12 MED ORDER — FLUDEOXYGLUCOSE F - 18 (FDG) INJECTION
9.4000 | Freq: Once | INTRAVENOUS | Status: AC | PRN
  Administered 2020-03-12: 9.39 via INTRAVENOUS

## 2020-03-12 MED ORDER — TRAZODONE HCL 50 MG PO TABS: 50.0000 mg | ORAL_TABLET | Freq: Every evening | ORAL | 3 refills | Status: DC | PRN

## 2020-03-12 NOTE — Telephone Encounter (Signed)
Pt's daughter has been made aware and in agreement for pt to see [name] at next visit to address symptoms.

## 2020-03-12 NOTE — Telephone Encounter (Signed)
Spoke with pt's daughter who reports that pt is not sleeping very well at night most likely due to increased anxiety. Pt's daughter would like to know if pt could get low dose xanax or something else related to help with sleep at night and anxiety.   Please advise.

## 2020-03-12 NOTE — Telephone Encounter (Signed)
Kimberly Huff- please inform daughter- that I would recommend trazodone to help sleep.  Also recommend evaluation with Merrily Pew [patient previously declined].  Please reinforce with the daughter regarding palliative care/with next visit with me. [name] any thoughts?   C-schedule appointment with Merrily Pew; coordinate with the next MD visit.   Thanks GB

## 2020-03-13 ENCOUNTER — Ambulatory Visit
Admission: RE | Admit: 2020-03-13 | Discharge: 2020-03-13 | Disposition: A | Discharge status: Home / Self Care | Payer: PPO | Source: Ambulatory Visit | Attending: Oncology | Admitting: Oncology

## 2020-03-13 ENCOUNTER — Inpatient Hospital Stay: Admit: 2020-03-13 | Payer: PPO

## 2020-03-13 ENCOUNTER — Encounter: Payer: Self-pay | Admitting: *Deleted

## 2020-03-13 ENCOUNTER — Other Ambulatory Visit: Payer: Self-pay | Admitting: Oncology

## 2020-03-13 ENCOUNTER — Inpatient Hospital Stay (HOSPITAL_BASED_OUTPATIENT_CLINIC_OR_DEPARTMENT_OTHER): Payer: PPO | Admitting: Oncology

## 2020-03-13 ENCOUNTER — Ambulatory Visit: Payer: PPO

## 2020-03-13 DIAGNOSIS — M19071 Primary osteoarthritis, right ankle and foot: Secondary | ICD-10-CM | POA: Diagnosis not present

## 2020-03-13 DIAGNOSIS — C7931 Secondary malignant neoplasm of brain: Secondary | ICD-10-CM | POA: Insufficient documentation

## 2020-03-13 DIAGNOSIS — Z510 Encounter for antineoplastic radiation therapy: Secondary | ICD-10-CM | POA: Diagnosis not present

## 2020-03-13 DIAGNOSIS — C3412 Malignant neoplasm of upper lobe, left bronchus or lung: Secondary | ICD-10-CM | POA: Insufficient documentation

## 2020-03-13 DIAGNOSIS — S99921A Unspecified injury of right foot, initial encounter: Secondary | ICD-10-CM

## 2020-03-13 DIAGNOSIS — M7989 Other specified soft tissue disorders: Secondary | ICD-10-CM | POA: Diagnosis not present

## 2020-03-13 DIAGNOSIS — M25571 Pain in right ankle and joints of right foot: Secondary | ICD-10-CM | POA: Diagnosis not present

## 2020-03-13 DIAGNOSIS — C3492 Malignant neoplasm of unspecified part of left bronchus or lung: Secondary | ICD-10-CM | POA: Diagnosis not present

## 2020-03-13 NOTE — Telephone Encounter (Signed)
Kimberly Huff, please arrange for palliative care apts at next visit.

## 2020-03-13 NOTE — Progress Notes (Signed)
Re: injury to right foot.  Patient called clinic today complaining of swelling and pain to her right foot.  Endorses dropping a box on her foot recently.  Patient will need imaging of her right foot/ankle to make sure there is no fracture.  We will call her with the results.  Faythe Casa, NP 03/13/2020 10:28 AM

## 2020-03-13 NOTE — Progress Notes (Signed)
Virtual Visit via Telephone Note  I connected with Kimberly Huff on 03/13/20 at  2:30 PM EST by telephone and verified that I am speaking with the correct person using two identifiers.  Location: Patient: Home Provider: Clinic    I discussed the limitations, risks, security and privacy concerns of performing an evaluation and management service by telephone and the availability of in person appointments. I also discussed with the patient that there may be a patient responsible charge related to this service. The patient expressed understanding and agreed to proceed.   History of Present Illness: Mrs. Kimberly Huff is a 74 year old female with past medical history significant for hypertension, Kimberly Huff, hyperlipidemia, type 2 diabetes, osteoporosis, chronic cystitis and recent diagnosis of lung cancer with metastasis to the brain.  She has already had SBRT with Dr. Donella Huff.  We are currently awaiting additional imaging with a PET scan and liquid biopsy and PD-L1 testing.  Plan is for her to begin radiation to left upper lobe mass while waiting on options for systemic chemotherapy.  Observations/Objective: Patient presents today for radiation and mentioned to one of the nurses that she recently dropped a box on her right foot.  Symptoms have been present for several days and appear to be worsening with swelling and pain.  She is able to bear weight but does describe some pain.  Assessment and Plan: We will get imaging of her right foot and ankle and call patient with results.  X-ray of her ankle and foot are negative for fracture but does show some soft tissue swelling.  Recommend rice therapy with rest ice compression and elevation.  Instructions given to patient.  Follow Up Instructions: Return to clinic as scheduled to see Dr. Rogue Huff on 03/18/2020.  I discussed the assessment and treatment plan with the patient. The patient was provided an opportunity to ask questions and all were answered. The  patient agreed with the plan and demonstrated an understanding of the instructions.   The patient was advised to call back or seek an in-person evaluation if the symptoms worsen or if the condition fails to improve as anticipated.  I provided 15 minutes of non-face-to-face time during this encounter.   Kimberly Hawking, NP

## 2020-03-13 NOTE — Progress Notes (Signed)
  Oncology Nurse Navigator Documentation  Navigator Location: [facility] (03/13/20 1100)   )Navigator Encounter Type: Lobby (03/13/20 1100)                     Patient Visit Type: [facility] (03/13/20 1100) Treatment Phase: CT SIM (03/13/20 1100) Barriers/Navigation Needs: Coordination of Care (03/13/20 1100)   Interventions: Coordination of Care;Referrals (03/13/20 1100) Referrals: Symptom Management (03/13/20 1100) Coordination of Care: Appts (03/13/20 1100)           met with patient and daughter during [name] visit today. All questions answered during visit. Pt having swelling to right foot with bruising and tenderness. Spoke with Sonia Baller in [facility] who recommended for pt to go for right foot xray and schedule pt for virtual visit with her in the afternoon. Pt has been made aware of all appts. Nothing further needed at this time. Instructed to call back with any further questions or needs. Pt and her daughter verbalized understanding.       Time Spent with Patient: 30 (03/13/20 1100)

## 2020-03-15 ENCOUNTER — Ambulatory Visit: Payer: PPO | Admitting: Radiation Oncology

## 2020-03-15 ENCOUNTER — Encounter: Payer: Self-pay | Admitting: Internal Medicine

## 2020-03-15 ENCOUNTER — Telehealth: Payer: Self-pay | Admitting: Internal Medicine

## 2020-03-15 NOTE — Telephone Encounter (Signed)
On 11/11-left voicemail for the daughter Lisa-PET scan no new sites of disease noted-except left upper lobe.  Follow-up as planned next week.

## 2020-03-18 ENCOUNTER — Encounter: Payer: Self-pay | Admitting: Internal Medicine

## 2020-03-18 ENCOUNTER — Encounter: Payer: Self-pay | Admitting: *Deleted

## 2020-03-18 ENCOUNTER — Other Ambulatory Visit: Payer: Self-pay

## 2020-03-18 ENCOUNTER — Inpatient Hospital Stay (HOSPITAL_BASED_OUTPATIENT_CLINIC_OR_DEPARTMENT_OTHER): Payer: PPO | Admitting: Internal Medicine

## 2020-03-18 ENCOUNTER — Telehealth (INDEPENDENT_AMBULATORY_CARE_PROVIDER_SITE_OTHER): Payer: PPO | Admitting: Internal Medicine

## 2020-03-18 ENCOUNTER — Inpatient Hospital Stay (HOSPITAL_BASED_OUTPATIENT_CLINIC_OR_DEPARTMENT_OTHER): Payer: PPO | Admitting: Hospice and Palliative Medicine

## 2020-03-18 ENCOUNTER — Inpatient Hospital Stay: Payer: PPO

## 2020-03-18 VITALS — BP 107/76 | HR 53 | Temp 99.7°F | Resp 18 | Ht 63.0 in | Wt 189.0 lb

## 2020-03-18 DIAGNOSIS — N261 Atrophy of kidney (terminal): Secondary | ICD-10-CM | POA: Diagnosis not present

## 2020-03-18 DIAGNOSIS — I714 Abdominal aortic aneurysm, without rupture, unspecified: Secondary | ICD-10-CM

## 2020-03-18 DIAGNOSIS — E1121 Type 2 diabetes mellitus with diabetic nephropathy: Secondary | ICD-10-CM | POA: Diagnosis not present

## 2020-03-18 DIAGNOSIS — K7460 Unspecified cirrhosis of liver: Secondary | ICD-10-CM

## 2020-03-18 DIAGNOSIS — Z515 Encounter for palliative care: Secondary | ICD-10-CM

## 2020-03-18 DIAGNOSIS — C3412 Malignant neoplasm of upper lobe, left bronchus or lung: Secondary | ICD-10-CM

## 2020-03-18 DIAGNOSIS — C3492 Malignant neoplasm of unspecified part of left bronchus or lung: Secondary | ICD-10-CM | POA: Diagnosis not present

## 2020-03-18 DIAGNOSIS — R1900 Intra-abdominal and pelvic swelling, mass and lump, unspecified site: Secondary | ICD-10-CM

## 2020-03-18 DIAGNOSIS — E785 Hyperlipidemia, unspecified: Secondary | ICD-10-CM

## 2020-03-18 DIAGNOSIS — M546 Pain in thoracic spine: Secondary | ICD-10-CM

## 2020-03-18 DIAGNOSIS — G8929 Other chronic pain: Secondary | ICD-10-CM | POA: Diagnosis not present

## 2020-03-18 DIAGNOSIS — R509 Fever, unspecified: Secondary | ICD-10-CM | POA: Diagnosis not present

## 2020-03-18 DIAGNOSIS — I700 Atherosclerosis of aorta: Secondary | ICD-10-CM

## 2020-03-18 DIAGNOSIS — Z510 Encounter for antineoplastic radiation therapy: Secondary | ICD-10-CM | POA: Diagnosis not present

## 2020-03-18 DIAGNOSIS — E1169 Type 2 diabetes mellitus with other specified complication: Secondary | ICD-10-CM | POA: Diagnosis not present

## 2020-03-18 LAB — CBC WITH DIFFERENTIAL/PLATELET
Abs Immature Granulocytes: 0.06 10*3/uL (ref 0.00–0.07)
Basophils Absolute: 0 10*3/uL (ref 0.0–0.1)
Basophils Relative: 0 %
Eosinophils Absolute: 0.2 10*3/uL (ref 0.0–0.5)
Eosinophils Relative: 3 %
HCT: 36.7 % (ref 36.0–46.0)
Hemoglobin: 12 g/dL (ref 12.0–15.0)
Immature Granulocytes: 1 %
Lymphocytes Relative: 42 %
Lymphs Abs: 3.2 10*3/uL (ref 0.7–4.0)
MCH: 27.2 pg (ref 26.0–34.0)
MCHC: 32.7 g/dL (ref 30.0–36.0)
MCV: 83.2 fL (ref 80.0–100.0)
Monocytes Absolute: 0.6 10*3/uL (ref 0.1–1.0)
Monocytes Relative: 8 %
Neutro Abs: 3.5 10*3/uL (ref 1.7–7.7)
Neutrophils Relative %: 46 %
Platelets: 158 10*3/uL (ref 150–400)
RBC: 4.41 MIL/uL (ref 3.87–5.11)
RDW: 16 % — ABNORMAL HIGH (ref 11.5–15.5)
WBC: 7.5 10*3/uL (ref 4.0–10.5)
nRBC: 0 % (ref 0.0–0.2)

## 2020-03-18 LAB — URINALYSIS, COMPLETE (UACMP) WITH MICROSCOPIC
Bilirubin Urine: NEGATIVE
Glucose, UA: NEGATIVE mg/dL
Hgb urine dipstick: NEGATIVE
Ketones, ur: NEGATIVE mg/dL
Nitrite: POSITIVE — AB
Protein, ur: NEGATIVE mg/dL
Specific Gravity, Urine: 1.018 (ref 1.005–1.030)
pH: 6 (ref 5.0–8.0)

## 2020-03-18 LAB — COMPREHENSIVE METABOLIC PANEL
ALT: 19 U/L (ref 0–44)
AST: 14 U/L — ABNORMAL LOW (ref 15–41)
Albumin: 3.4 g/dL — ABNORMAL LOW (ref 3.5–5.0)
Alkaline Phosphatase: 31 U/L — ABNORMAL LOW (ref 38–126)
Anion gap: 8 (ref 5–15)
BUN: 12 mg/dL (ref 8–23)
CO2: 28 mmol/L (ref 22–32)
Calcium: 9.1 mg/dL (ref 8.9–10.3)
Chloride: 100 mmol/L (ref 98–111)
Creatinine, Ser: 0.84 mg/dL (ref 0.44–1.00)
GFR, Estimated: >60 mL/min (ref >60)
Glucose, Bld: 175 mg/dL — ABNORMAL HIGH (ref 70–99)
Potassium: 3.9 mmol/L (ref 3.5–5.1)
Sodium: 136 mmol/L (ref 135–145)
Total Bilirubin: 0.8 mg/dL (ref 0.3–1.2)
Total Protein: 6.5 g/dL (ref 6.5–8.1)

## 2020-03-18 MED ORDER — ONDANSETRON HCL 8 MG PO TABS: ORAL_TABLET | ORAL | 1 refills | Status: DC

## 2020-03-18 MED ORDER — HYDROCODONE-ACETAMINOPHEN 10-325 MG PO TABS
1.0000 | ORAL_TABLET | Freq: Four times a day (QID) | ORAL | 0 refills | Status: DC | PRN
Start: 2020-03-18 — End: 2020-03-25

## 2020-03-18 MED ORDER — PROCHLORPERAZINE MALEATE 10 MG PO TABS: 10.0000 mg | ORAL_TABLET | Freq: Four times a day (QID) | ORAL | 1 refills | Status: AC | PRN

## 2020-03-18 NOTE — Patient Instructions (Signed)
Stop the metformin to see if the nausea resolves  Continue 15 units of Lantus daily   Adding Vicodin (10/325) twice daily for pain management.  Limit your additional tylenol use to 1350 mg daily    I'll talk to [employer] about HOW to get you  [employer] monitor  I''ll talk to Dr Rogue Bussing about what alternatives you would be able to to manage your back pain (ESI:  Epidural steroid injections )

## 2020-03-18 NOTE — Progress Notes (Signed)
[facility] CONSULT NOTE  Patient Care Team: [name], MD as PCP - General (Internal Medicine) [name], MD (General Surgery) [name], MD (Internal Medicine) Telford Nab, RN as Oncology Nurse Navigator  CHIEF COMPLAINTS/PURPOSE OF CONSULTATION:  Brain metastases  #  Oncology History Overview Note  # OCT 2021-left upper lobe lung mass invading mediastinum; bronc/Bx-  Oct 20th [Dr.Aleskerov]; non-small cell carcinoma/adenocarcinoma.  Stage IV [brain metastases]; NGS-QNS; PDL-1- 95%.  #2021-PET scan left upper lobe mass; presacral question benign mass; no distant site disease  #Right frontal/temporal brain met x2 [1-1.68m]-seizure- on dex; Keppra; SBRT s/p Oct 13th, 2021  # 11/18-RT to LUL ca [? hemoptysis]  # DM on OHA; HTN; SOLITARY KIDNEY [frequent infections/ atrophic- s/p nephrectomy 2020]  # NGS/MOLECULAR TESTS:NGS-QNS; guardant testing-inconclusive; PDL-1- 95%.     # PALLIATIVE CARE EVALUATION:03/18/2020  # PAIN MANAGEMENT:   DIAGNOSIS: Lung cancer  STAGE:   IV      ;  GOALS: Palliative  CURRENT/MOST RECENT THERAPY : RT    Brain metastasis ([facility]  02/02/2020 Initial Diagnosis   [place]rain [place] ([facility]   Primary cancer of left upper lobe of lung ([facility]  02/26/2020 Initial Diagnosis   Primary cancer of left upper lobe of lung ([facility])    HISTORY OF PRESENTING ILLNESS:  Kimberly [id number].[id number].  female  patient with a history of solitary kidney; diabetes obesity; prior history of smoking; in left upper lobe lung mass; brain metastasis status post SBRT is here for follow-up.  In the interim patient was evaluated by radiation oncology.  She is currently awaiting to start radiation to the left upper lobe.  Patient is currently weaned off steroids.  Complains of fatigue.  No worsening hemoptysis.  Mild swelling in the legs overall improved.  Not resolved.  No headaches.  Noted to have worsening pain in the lower back.  Noted  especially since being on the steroids.  However her blood sugars are better controlled.  Noted to have a low-grade temperature of 100.1 in the clinic today.  None at home.  No dysuria.  No worsening cough.  Review of Systems  Constitutional: Positive for malaise/fatigue. Negative for chills, diaphoresis, fever and weight loss.  HENT: Negative for nosebleeds and sore throat.   Eyes: Negative for double vision.  Respiratory: Negative for cough, sputum production and wheezing.   Cardiovascular: Positive for leg swelling. Negative for chest pain, palpitations and orthopnea.  Gastrointestinal: Negative for abdominal pain, blood in stool, constipation, diarrhea, heartburn, melena, nausea and vomiting.  Genitourinary: Negative for dysuria, frequency and urgency.  Musculoskeletal: Negative for back pain and joint pain.  Skin: Negative.  Negative for itching and rash.  Neurological: Negative for dizziness, tingling, focal weakness, weakness and headaches.  Endo/Heme/Allergies: Does not bruise/bleed easily.  Psychiatric/Behavioral: Negative for depression. The patient is not nervous/anxious and does not have insomnia.      MEDICAL HISTORY:  Past Medical History:  Diagnosis Date  . Allergy   . Atrophic kidney    left  . Blood clot in vein 45 yrs ago   inabdoment after surgery  . Blood transfusion without reported diagnosis   . Cataract   . Complication of anesthesia   . Coronary artery disease   . Coronary atherosclerosis   . Diabetes mellitus   . Diverticulosis 2005   Dr SJamal Collin . Dyspnea    with exertion  . External hemorrhoids without mention of complication 23845 . GERD (gastroesophageal reflux disease)   .  H/O cardiac catheterization 2005  . Heart murmur 2012   found by Dr. Jamal Collin  . Hyperlipidemia   . Hypertension   . Kidney stone   . Lesion of bladder   . Myocardial infarction ([place]) 2000  . Neuromuscular disorder ([place])    "achy muscles"  . Obesity   . Osteoarthritis    . PONV (postoperative nausea and vomiting)   . Recurrent UTI   . Seizure ([place])   . Spinal stenosis of cervical region   . Steatohepatitis 05/30/10   Brunt Stage 3, non alcoholic cirrhosis of the liver  . Type II or unspecified type diabetes mellitus without mention of complication, uncontrolled    Patient does takes Metformin and recommended to stop for 48 hours.  Marland Kitchen Ulcer   . Unspecified essential hypertension 2014    SURGICAL HISTORY: Past Surgical History:  Procedure Laterality Date  . ABDOMINAL HYSTERECTOMY  1982   complete  . BREAST CYST ASPIRATION Right 1990's   benign  . BREAST MASS EXCISION     benign  . CARDIAC CATHETERIZATION  2000   cardiac stent  . cardiac stents  2000  . CATARACT EXTRACTION     both eyes  . COLONOSCOPY  2004, 2015   Dr. Jamal Collin, Dr. Olevia Perches  . CYSTOSCOPY W/ RETROGRADES Left 11/12/2015   Procedure: CYSTOSCOPY WITH RETROGRADE PYELOGRAM;  Surgeon: Cleon Gustin, MD;  Location: [facility];  Service: Urology;  Laterality: Left;  . CYSTOSCOPY W/ URETERAL STENT PLACEMENT Left 11/12/2015   Procedure: CYSTOSCOPY WITH STENT REPLACEMENT;  Surgeon: Cleon Gustin, MD;  Location: [facility];  Service: Urology;  Laterality: Left;  . CYSTOSCOPY WITH BIOPSY N/A 09/26/2019   Procedure: CYSTOSCOPY WITH BIOPSY AND FULGURATION;  Surgeon: Cleon Gustin, MD;  Location[facility];  Service: Urology;  Laterality: N/A;  . CYSTOSCOPY WITH URETEROSCOPY AND STENT PLACEMENT Left 10/15/2015   Procedure: CYSTOSCOPY WITH URETEROSCOPY AND STENT PLACEMENT;  Surgeon: Cleon Gustin, MD;  Location: [facility];  Service: Urology;  Laterality: Left;  . EYE SURGERY Bilateral 2012   cataract  . FOOT SURGERY  2008  . PTCA  2000   stent x 1  . ROBOTIC ASSITED PARTIAL NEPHRECTOMY Left 06/02/2019   Procedure: XI ROBOTIC ASSITED NEPHRECTOMY;  Surgeon: Cleon Gustin, MD;  Location: [facility];  Service: Urology;  Laterality: Left;  . SALPINGOOPHORECTOMY    .  TONSILLECTOMY    . UPPER GI ENDOSCOPY    . URETEROSCOPY WITH HOLMIUM LASER LITHOTRIPSY Left 11/12/2015   Procedure: URETEROSCOPY WITH HOLMIUM LASER LITHOTRIPSY;  Surgeon: Cleon Gustin, MD;  Location: [facility];  Service: Urology;  Laterality: Left;  . URETEROTOMY  1960/1969   x 2 during childhood  . VAGINAL HYSTERECTOMY    . VIDEO BRONCHOSCOPY WITH ENDOBRONCHIAL NAVIGATION N/A 02/21/2020   Procedure: VIDEO BRONCHOSCOPY WITH ENDOBRONCHIAL NAVIGATION;  Surgeon: Ottie Glazier, MD;  Location: [facility];  Service: Thoracic;  Laterality: N/A;  . VIDEO BRONCHOSCOPY WITH ENDOBRONCHIAL ULTRASOUND N/A 02/21/2020   Procedure: VIDEO BRONCHOSCOPY WITH ENDOBRONCHIAL ULTRASOUND;  Surgeon: Ottie Glazier, MD;  Location: [facility];  Service: Thoracic;  Laterality: N/A;    SOCIAL HISTORY: Social History   Socioeconomic History  . Marital status: Single    Spouse name: Not on file  . Number of children: 2  . Years of education: Not on file  . Highest education level: Not on file  Occupational History  . 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  Smoking status: Former Smoker    Packs/day: 0.50    Years: 38.00    Pack years: 19.00    Types: Cigarettes    Quit date: 09/15/1998    Years since quitting: 21.5  . Smokeless tobacco: Never Used  Vaping Use  . Vaping Use: Never used  Substance and Sexual Activity  . Alcohol use: Not Currently  . Drug use: No  . Sexual activity: Not Currently  Other Topics Concern  . Not on file  Social History Narrative   Lives in [place]; daughter lives in [place]. Quit smoking may 14th 2000. No alcohol. Retired  From [employer] job.    Social Determinants of Health   Financial Resource Strain: Low Risk   . Difficulty of Paying Living Expenses: Not hard at all  Food Insecurity: No Food Insecurity  . Worried About Charity fundraiser in the Last Year: Never true  . Ran Out of Food in the Last Year: Never true   Transportation Needs: No Transportation Needs  . Lack of Transportation (Medical): No  . Lack of Transportation (Non-Medical): No  Physical Activity:   . Days of Exercise per Week: Not on file  . Minutes of Exercise per Session: Not on file  Stress: No Stress Concern Present  . Feeling of Stress : Not at all  Social Connections: Unknown  . Frequency of Communication with Friends and Family: More than three times a week  . Frequency of Social Gatherings with Friends and Family: More than three times a week  . Attends Religious Services: More than 4 times per year  . Active Member of Clubs or Organizations: Yes  . Attends Archivist Meetings: More than 4 times per year  . Marital Status: Not on file  Intimate Partner Violence: Not At Risk  . Fear of Current or Ex-Partner: No  . Emotionally Abused: No  . Physically Abused: No  . Sexually Abused: No    FAMILY HISTORY: Family History  Problem Relation Age of Onset  . Lung cancer Father   . Heart disease Father   . Diverticulosis Mother   . Diabetes Mother   . Stroke Mother 74  . Autoimmune disease Daughter   . Thyroid disease Daughter   . Thyroid disease Sister   . Stomach cancer Maternal Aunt   . Breast cancer Maternal Aunt   . Colon cancer Neg Hx   . Esophageal cancer Neg Hx   . Rectal cancer Neg Hx     ALLERGIES:  has No Known Allergies.  MEDICATIONS:  Current Outpatient Medications  Medication Sig Dispense Refill  . denosumab (PROLIA) 60 MG/ML SOSY injection Inject into the skin.    . furosemide (LASIX) 40 MG tablet TAKE ONE TABLET EVERY DAY (Patient taking differently: Take 40 mg by mouth daily. ) 90 tablet 1  . HYDROcodone-acetaminophen (NORCO) 10-325 MG tablet Take 1 tablet by mouth every 6 (six) hours as needed. 30 tablet 0  . Insulin Pen Needle 32G X 6 MM MISC 1 Stick by Does not apply route daily. 100 each 1  . LANTUS SOLOSTAR 100 UNIT/ML Solostar Pen INJECT 20 UNITS INTO THE SKIN DAILY AS DIRECTED  (Patient taking differently: Inject 15 Units into the skin daily. ) 15 mL PRN  . levETIRAcetam (KEPPRA) 500 MG tablet Take 1 tablet (500 mg total) by mouth 2 (two) times daily. 60 tablet 0  . losartan (COZAAR) 50 MG tablet TAKE ONE TABLET EVERY DAY (Patient taking differently: Take 50 mg by mouth  every morning. ) 90 tablet 3  . omeprazole (PRILOSEC) 40 MG capsule TAKE 1 CAPSULE BY MOUTH ONCE DAILY (Patient taking differently: Take 40 mg by mouth every morning. ) 90 capsule 1  . ONETOUCH ULTRA test strip USE AS INSTRUCTED 100 each 12  . rosuvastatin (CRESTOR) 10 MG tablet TAKE ONE TABLET EVERY DAY (Patient taking differently: Take 10 mg by mouth at bedtime. ) 90 tablet 1  . traZODone (DESYREL) 50 MG tablet Take 1 tablet (50 mg total) by mouth at bedtime as needed for sleep. 30 tablet 3  . Vitamin D, Ergocalciferol, (DRISDOL) 1.25 MG (50000 UNIT) CAPS capsule TAKE ONE CAPSULE EACH WEEK (Patient taking differently: Take 50,000 Units by mouth every 7 (seven) days. THURSDAYS) 4 capsule 11  . ondansetron (ZOFRAN) 8 MG tablet One pill every 8 hours as needed for nausea/vomitting. 40 tablet 1  . prochlorperazine (COMPAZINE) 10 MG tablet Take 1 tablet (10 mg total) by mouth every 6 (six) hours as needed for nausea or vomiting. 40 tablet 1  . senna-docusate (SENOKOT-S) 8.6-50 MG tablet Take 2 tablets by mouth daily as needed for mild constipation. (Patient not taking: Reported on 03/08/2020) 30 tablet 1   No current facility-administered medications for this visit.      Marland Kitchen  PHYSICAL EXAMINATION: ECOG PERFORMANCE STATUS: 1 - Symptomatic but completely ambulatory  Vitals:   03/18/20 1518  BP: 107/76  Pulse: (!) 53  Resp: 18  Temp: 99.7 F (37.6 C)  SpO2: 95%   Filed Weights   03/18/20 1518  Weight: 189 lb (85.7 kg)    Physical Exam Vitals and nursing note reviewed.  Constitutional:      Comments: Patient is alone.  She is walking independently.  HENT:     Head: Normocephalic and atraumatic.      Mouth/Throat:     Pharynx: No oropharyngeal exudate.  Eyes:     Pupils: Pupils are equal, round, and reactive to light.  Cardiovascular:     Rate and Rhythm: Normal rate and regular rhythm.  Pulmonary:     Effort: No respiratory distress.     Breath sounds: No wheezing.  Abdominal:     General: Bowel sounds are normal. There is no distension.     Palpations: Abdomen is soft. There is no mass.     Tenderness: There is no abdominal tenderness. There is no guarding or rebound.  Musculoskeletal:        General: No tenderness. Normal range of motion.     Cervical back: Normal range of motion and neck supple.  Skin:    General: Skin is warm.  Neurological:     Mental Status: She is alert and oriented to person, place, and time.  Psychiatric:        Mood and Affect: Affect normal.      LABORATORY DATA:  I have reviewed the data as listed Lab Results  Component Value Date   WBC 7.5 03/18/2020   HGB 12.0 03/18/2020   HCT 36.7 03/18/2020   MCV 83.2 03/18/2020   PLT 158 03/18/2020   Recent Labs    06/03/19 0434 06/29/19 0819 02/02/20 0925 02/02/20 0925 02/03/20 0541 02/09/20 1002 02/26/20 1407 03/04/20 1113 03/18/20 1509  NA 136   < > 137   < > 133*   < > 129* 134* 136  K 4.2   < > 4.2   < > 4.3   < > 3.8 4.0 3.9  CL 104   < > 100   < >  99   < > 96* 98 100  CO2 26   < > 27   < > 24   < > 24 26 28   GLUCOSE 125*   < > 177*   < > 248*   < > 443* 118* 175*  BUN 11   < > 16   < > 18   < > 30* 32* 12  CREATININE 0.62   < > 0.81   < > 0.78   < > 0.93 1.08* 0.84  CALCIUM 9.0   < > 9.6   < > 9.2   < > 8.0* 9.3 9.1  GFRNONAA >60  --  >60   < > >60   < > >60 53* >60  GFRAA >60  --  >60  --  >60  --   --   --   --   PROT  --    < > 7.7  --   --    < > 6.2* 6.7 6.5  ALBUMIN  --    < > 4.5  --   --    < > 3.7 4.0 3.4*  AST  --    < > 29  --   --    < > 18 26 14*  ALT  --    < > 31  --   --    < > 38 48* 19  ALKPHOS  --    < > 43  --   --    < > 37* 33* 31*  BILITOT  --    <  > 0.9  --   --    < > 1.0 1.1 0.8   < > = values in this interval not displayed.    RADIOGRAPHIC STUDIES: I have personally reviewed the radiological images as listed and agreed with the findings in the report. DG Ankle 2 Views Right  Result Date: 03/13/2020 CLINICAL DATA:  Injury.  Pain and swelling EXAM: RIGHT ANKLE - 2 VIEW COMPARISON:  None. FINDINGS: Bilateral soft tissue swelling. Normal alignment no fracture. No significant arthropathy. Mild spurring on the plantar surface of the calcaneus. IMPRESSION: Diffuse soft tissue swelling.  Negative for fracture. Electronically Signed   By: Franchot Gallo M.D.   On: 03/13/2020 16:53   CT CHEST WO CONTRAST  Result Date: 02/19/2020 CLINICAL DATA:  74 year old female with stage IV lung cancer. Follow-up CT. EXAM: CT CHEST WITHOUT CONTRAST TECHNIQUE: Multidetector CT imaging of the chest was performed following the standard protocol without IV contrast. COMPARISON:  Chest CT dated 02/02/2020. FINDINGS: Evaluation of this exam is limited in the absence of intravenous contrast. Cardiovascular: There is no cardiomegaly or pericardial effusion. Three-vessel coronary vascular calcification. Top-normal ascending aorta measure up to 3.9 cm. There is moderate atherosclerotic calcification of the thoracic aorta. The central pulmonary arteries are grossly unremarkable. Mediastinum/Nodes: No hilar or mediastinal adenopathy. Esophagus and the thyroid gland are grossly unremarkable. No mediastinal fluid collection. Lungs/Pleura: Left upper lobe lobulated mass abutting the mediastinal pleural measures approximately 6.6 x 3.4 cm (previously 7.5 x 4.4 cm measured in similar plane). A 5 mm nodule in the left upper lobe (55/3) appears similar to prior CT. There is a 13 mm nodule in the right lower lobe along the fissure, similar to prior CT. A cluster of nodular density in the right upper lobe measure approximately 1 cm in combined dimension (previously 1.2 cm). No  consolidative changes. There is no pleural effusion pneumothorax. The central airways are  patent. Upper Abdomen: Slight irregularity of the liver contour, likely early changes of cirrhosis. Trace free fluid adjacent to the spleen. Musculoskeletal: Osteopenia with degenerative changes of the spine. No acute osseous pathology. IMPRESSION: 1. Interval decrease in the size of the left upper lobe lobulated mass. 2. Bilateral pulmonary nodules as described above, similar to prior CT. 3. No hilar or mediastinal adenopathy. 4. Aortic Atherosclerosis (ICD10-I70.0). Electronically Signed   By: Anner Crete M.D.   On: 02/19/2020 20:37   NM PET Image Initial (PI) Skull Base To Thigh  Result Date: 03/13/2020 CLINICAL DATA:  Initial treatment strategy for left upper lobe lung cancer. EXAM: NUCLEAR MEDICINE PET SKULL BASE TO THIGH TECHNIQUE: 9.4 mCi F-18 FDG was injected intravenously. Full-ring PET imaging was performed from the skull base to thigh after the radiotracer. CT data was obtained and used for attenuation correction and anatomic localization. Fasting blood glucose: 116 mg/dl COMPARISON:  Chest CT 02/19/2020 FINDINGS: Mediastinal blood pool activity: SUV max 2.9 Liver activity: SUV max NA NECK: The patient has known intracranial metastatic lesions are fairly inapparent on today's PET-CT. No hypermetabolic adenopathy in the neck. Incidental CT findings: Bilateral common carotid atherosclerotic calcification. CHEST: The medial left upper lobe mass measures 7.0 by 3.6 cm on image 78 of series 3 and has maximum SUV of 14.2. This mass abuts the mediastinal margin and probably extend slightly into the AP window region of the mediastinum. No overtly pathologic adenopathy is identified. A 0.8 cm lower right paratracheal lymph node on image 87 of series 3 has a maximum SUV of 2.0 which is near blood pool. Sub solid 0.8 by 0.8 cm right upper lobe pulmonary nodule on image 79 of series 3, maximum SUV 0.9, not  significantly hypermetabolic but merit surveillance. There is some probable linear scarring in the right lower lobe adjacent to the major fissure on image 95 of series 3 with slightly nodular component but without hypermetabolic activity. Incidental CT findings: Coronary, aortic arch, and branch vessel atherosclerotic vascular disease. Mild cardiomegaly. Ascending aortic aneurysm 4.1 cm in diameter on image 95 of series 3. ABDOMEN/PELVIS: Physiologic activity in bowel, particularly rectum, without a readily appreciable rectal mass. A presacral lesion with hazy fatty elements measuring 4.6 by 3.3 cm on image 217 of series 3 has a maximum SUV of 2.7. This is probably a myelolipoma with entities such as liposarcoma and extramedullary hematopoiesis considered possible but less likely. Photopenic right mid to lower renal cyst anteriorly. Absent left kidney. Incidental CT findings: Aortoiliac atherosclerotic vascular disease. SKELETON: No significant abnormal hypermetabolic activity in this region. Incidental CT findings: Healed deformity of the right proximal humerus. Lumbar spondylosis and degenerative disc disease. Grade 1 degenerative anterolisthesis at L4-5. IMPRESSION: 1. Hypermetabolic 7.0 cm medial left upper lobe mass, maximum SUV 14.2. This may slightly extend into the AP window region. No definite pathologic adenopathy. 2. Sub solid 0.8 cm right upper lobe nodule is not substantially hypermetabolic but merit surveillance. 3. 4.6 cm in long axis presacral lesion with hazy fatty elements, probably a myelolipoma, less likely be liposarcoma or extramedullary hematopoiesis. In light of the patient's known metastatic lung cancer, I would suggest surveillance of this lesion on follow up imaging to ensure lack of enlargement/progression, with presumption that this is most likely benign. 4. Known intracranial metastatic lesions are not well seen on today's PET-CT, but would be better followed by MRI. 5. Ascending  thoracic aortic aneurysm 4.1 cm in diameter. This could probably be followed in the context of the patient's  follow up cancer imaging. Otherwise, we recommend annual imaging followup by CTA or MRA. This recommendation follows 2010 ACCF/AHA/AATS/ACR/ASA/SCA/SCAI/SIR/STS/SVM Guidelines for the Diagnosis and Management of Patients with Thoracic Aortic Disease. Circulation. 2010; 121: C789-F810. Aortic aneurysm NOS (ICD10-I71.9) 6. Other imaging findings of potential clinical significance: Aortic Atherosclerosis (ICD10-I70.0). Coronary atherosclerosis. Mild cardiomegaly. Lumbar spondylosis and degenerative disc disease. Electronically Signed   By: Van Clines M.D.   On: 03/13/2020 09:36   DG Foot Complete Right  Result Date: 03/13/2020 CLINICAL DATA:  Right foot injury with pain and swelling EXAM: RIGHT FOOT COMPLETE - 3+ VIEW COMPARISON:  None. FINDINGS: Negative for fracture Degenerative change in the fifth MTP. Moderate hallux valgus with degenerative change in the first MTP. Spurring of the plantar surface the calcaneus. IMPRESSION: Negative for fracture. Electronically Signed   By: Franchot Gallo M.D.   On: 03/13/2020 17:00   DG C-Arm 1-60 Min-No Report  Result Date: 02/21/2020 Fluoroscopy was utilized by the requesting physician.  No radiographic interpretation.    ASSESSMENT & PLAN:   Primary cancer of left upper lobe of lung ([place]) #Non-small cell lung cancer/adenocarcinoma-  left lung mass/invading the mediastinum; Stage IV [brain metastases]; QNS for NGS; GUARDANT-NEGATIVE.  NOV- PET scan- NO DISTANT METASTATIC DISEASE. PD-L1-95%.   #Proceed with palliative radiation to left upper lobe mass; plan to start 11/18 [6 weeks].  Hold off any additional/concurrent chemotherapy.     Discussed that patient be candidate for immunotherapy based on PD-L1 status post radiation therapy. I discussed the mechanism of action; The goal of therapy is palliative; and length of treatments are likely  ongoing/based upon the results of the scans. Discussed the potential side effects of immunotherapy including but not limited to diarrhea; skin rash; elevated LFTs/endocrine abnormalities etc.    #Seizure secondary to brain metastases/edema x2 [10 to 15 millimeters]; s/p SBRT 10/13; tapered off dexamethasone.  We will plan to get MRI approximately 3 months post treatment; mid Jan 2022.   # DM-on insulin.  Better controlled as patient is tapered off steroids.  Monitor closely.   #Worsening back pain-likely degenerative as no metastasis noted on recent PET scan.  Unlikely presacral benign lesion as cause of her pain.  Discussed that this is reasonable to be evaluated by pain management/orthopedics for steroid injections.  Patient let us know  # Solitary kidney-normal renal function.  Stable  #Bilateral leg swelling--improved not resolved.  Secondary steroidsRecommend leg elevation/compression; currently off steroids.  #Low-grade fever-question tumor fever rather than infectious cause.  Order UA and culture.  # DISPOSITION: # UA/culture today # follow up in 1 month- MD; labs- cbc/cmp-Dr.B  Addendum: Message left for the patient's daughter Kimberly Huff regarding the results of the PD-L1; however no change in treatment plan as patient is currently awaiting to start radiation this week.  All questions were answered. The patient knows to call the clinic with any problems, questions or concerns.    [name], MD 03/18/2020 8:08 PM

## 2020-03-18 NOTE — Progress Notes (Signed)
Telephone  Note  This visit type was conducted due to national recommendations for restrictions regarding the COVID-19 pandemic (e.g. social distancing).  This format is felt to be most appropriate for this patient at this time.  All issues noted in this document were discussed and addressed.  No physical exam was performed (except for noted visual exam findings with Video Visits).   I connected with@ on 03/18/20 at 11:00 AM EST by  telephone and verified that I am speaking with the correct person using two identifiers. Location patient: home Location provider: work or home office Persons participating in the virtual visit: patient, provider and daughter [name]   I discussed the limitations, risks, security and privacy concerns of performing an evaluation and management service by telephone and the availability of in person appointments. I also discussed with the patient that there may be a patient responsible charge related to this service. The patient expressed understanding and agreed to proceed.  Reason for visit: follow up on diabetes,  CKD. Worsening back pain  HPI:    74 yr old female with type 2 DM , previously diet controlled,  Presents for follow up on uncontrolled diabetes, now insulin requiring, secondary to steroid use for management of newly diagnosedLUL lung mass suspicious for lung CA with brain mets presenting as new onset seizure on October 1.  Patient has been experiencing nausea on a daily basis.  She is using Lantus  15 units daily, metformin 850 mg bid and BS remain out of range.  She has not had any hypoglycemic events.   She is scheduled to start XRT of lung  5 days per week x 6 weeks on Thursday  With Kendall Flack, MD.  She has an appt with her oncologist Dr.  Rogue Bussing this afternoon to determine course and timing of chemotherapy which will be based on results of lung biopsy.  PET scan was done since last visit and a presacral mass was noted that had low SUV suggestive of a  benign lesion.    Her dexamethasone dose has been gradually reduced and finally stopped.  Her back pain has become intolerable since the steroids were discontinued.  She is using Sal[place] patches with lidocaine and maxing out tylenol at 2000 mg daily.  PET scan and previous MRI thoracic spine reviewed.  [name] wondered if the presacral mass was contributing to her pain .   ROS: See pertinent positives and negatives per HPI.  Past Medical History:  Diagnosis Date  . Allergy   . Atrophic kidney    left  . Blood clot in vein 45 yrs ago   inabdoment after surgery  . Blood transfusion without reported diagnosis   . Cataract   . Complication of anesthesia   . Coronary artery disease   . Coronary atherosclerosis   . Diabetes mellitus   . Diverticulosis 2005   Dr Jamal Collin  . Dyspnea    with exertion  . External hemorrhoids without mention of complication 2876  . GERD (gastroesophageal reflux disease)   . H/O cardiac catheterization 2005  . Heart murmur 2012   found by Dr. Jamal Collin  . Hyperlipidemia   . Hypertension   . Kidney stone   . Lesion of bladder   . Myocardial infarction ([place]) 2000  . Neuromuscular disorder ([place])    "achy muscles"  . Obesity   . Osteoarthritis   . PONV (postoperative nausea and vomiting)   . Recurrent UTI   . Seizure ([place])   . Spinal stenosis  of cervical region   . Steatohepatitis 05/30/10   Brunt Stage 3, non alcoholic cirrhosis of the liver  . Type II or unspecified type diabetes mellitus without mention of complication, uncontrolled    Patient does takes Metformin and recommended to stop for 48 hours.  Marland Kitchen Ulcer   . Unspecified essential hypertension 2014    Past Surgical History:  Procedure Laterality Date  . ABDOMINAL HYSTERECTOMY  1982   complete  . BREAST CYST ASPIRATION Right 1990's   benign  . BREAST MASS EXCISION     benign  . CARDIAC CATHETERIZATION  2000   cardiac stent  . cardiac stents  2000  . CATARACT EXTRACTION     both eyes  .  COLONOSCOPY  2004, 2015   Dr. Jamal Collin, Dr. Olevia Perches  . CYSTOSCOPY W/ RETROGRADES Left 11/12/2015   Procedure: CYSTOSCOPY WITH RETROGRADE PYELOGRAM;  Surgeon: Cleon Gustin, MD;  Location: [facility];  Service: Urology;  Laterality: Left;  . CYSTOSCOPY W/ URETERAL STENT PLACEMENT Left 11/12/2015   Procedure: CYSTOSCOPY WITH STENT REPLACEMENT;  Surgeon: Cleon Gustin, MD;  Location: [facility];  Service: Urology;  Laterality: Left;  . CYSTOSCOPY WITH BIOPSY N/A 09/26/2019   Procedure: CYSTOSCOPY WITH BIOPSY AND FULGURATION;  Surgeon: Cleon Gustin, MD;  [facility];  Service: Urology;  Laterality: N/A;  . CYSTOSCOPY WITH URETEROSCOPY AND STENT PLACEMENT Left 10/15/2015   Procedure: CYSTOSCOPY WITH URETEROSCOPY AND STENT PLACEMENT;  Surgeon: Cleon Gustin, MD;  Location: [facility];  Service: Urology;  Laterality: Left;  . EYE SURGERY Bilateral 2012   cataract  . FOOT SURGERY  2008  . PTCA  2000   stent x 1  . ROBOTIC ASSITED PARTIAL NEPHRECTOMY Left 06/02/2019   Procedure: XI ROBOTIC ASSITED NEPHRECTOMY;  Surgeon: Cleon Gustin, MD;  Location: [facility];  Service: Urology;  Laterality: Left;  . SALPINGOOPHORECTOMY    . TONSILLECTOMY    . UPPER GI ENDOSCOPY    . URETEROSCOPY WITH HOLMIUM LASER LITHOTRIPSY Left 11/12/2015   Procedure: URETEROSCOPY WITH HOLMIUM LASER LITHOTRIPSY;  Surgeon: Cleon Gustin, MD;  Location: [facility];  Service: Urology;  Laterality: Left;  . URETEROTOMY  1960/1969   x 2 during childhood  . VAGINAL HYSTERECTOMY    . VIDEO BRONCHOSCOPY WITH ENDOBRONCHIAL NAVIGATION N/A 02/21/2020   Procedure: VIDEO BRONCHOSCOPY WITH ENDOBRONCHIAL NAVIGATION;  Surgeon: Ottie Glazier, MD;  Location: [facility];  Service: Thoracic;  Laterality: N/A;  . VIDEO BRONCHOSCOPY WITH ENDOBRONCHIAL ULTRASOUND N/A 02/21/2020   Procedure: VIDEO BRONCHOSCOPY WITH ENDOBRONCHIAL ULTRASOUND;  Surgeon: Ottie Glazier, MD;  Location: [facility];  Service: Thoracic;   Laterality: N/A;    Family History  Problem Relation Age of Onset  . Lung cancer Father   . Heart disease Father   . Diverticulosis Mother   . Diabetes Mother   . Stroke Mother 1  . Autoimmune disease Daughter   . Thyroid disease Daughter   . Thyroid disease Sister   . Stomach cancer Maternal Aunt   . Breast cancer Maternal Aunt   . Colon cancer Neg Hx   . Esophageal cancer Neg Hx   . Rectal cancer Neg Hx     SOCIAL HX:  reports that she quit smoking about 21 years ago. Her smoking use included cigarettes. She has a 19.00 pack-year smoking history. She has never used smokeless tobacco. She reports previous alcohol use. She reports that she does not use drugs.   Current Outpatient Medications:  .  denosumab (PROLIA)  60 MG/ML SOSY injection, Inject into the skin., Disp: , Rfl:  .  furosemide (LASIX) 40 MG tablet, TAKE ONE TABLET EVERY DAY (Patient taking differently: Take 40 mg by mouth daily. ), Disp: 90 tablet, Rfl: 1 .  Insulin Pen Needle 32G X 6 MM MISC, 1 Stick by Does not apply route daily., Disp: 100 each, Rfl: 1 .  LANTUS SOLOSTAR 100 UNIT/ML Solostar Pen, INJECT 20 UNITS INTO THE SKIN DAILY AS DIRECTED (Patient taking differently: Inject 15 Units into the skin daily. ), Disp: 15 mL, Rfl: PRN .  levETIRAcetam (KEPPRA) 500 MG tablet, Take 1 tablet (500 mg total) by mouth 2 (two) times daily., Disp: 60 tablet, Rfl: 0 .  losartan (COZAAR) 50 MG tablet, TAKE ONE TABLET EVERY DAY (Patient taking differently: Take 50 mg by mouth every morning. ), Disp: 90 tablet, Rfl: 3 .  omeprazole (PRILOSEC) 40 MG capsule, TAKE 1 CAPSULE BY MOUTH ONCE DAILY (Patient taking differently: Take 40 mg by mouth every morning. ), Disp: 90 capsule, Rfl: 1 .  ONETOUCH ULTRA test strip, USE AS INSTRUCTED, Disp: 100 each, Rfl: 12 .  rosuvastatin (CRESTOR) 10 MG tablet, TAKE ONE TABLET EVERY DAY (Patient taking differently: Take 10 mg by mouth at bedtime. ), Disp: 90 tablet, Rfl: 1 .  traZODone (DESYREL) 50  MG tablet, Take 1 tablet (50 mg total) by mouth at bedtime as needed for sleep., Disp: 30 tablet, Rfl: 3 .  Vitamin D, Ergocalciferol, (DRISDOL) 1.25 MG (50000 UNIT) CAPS capsule, TAKE ONE CAPSULE EACH WEEK (Patient taking differently: Take 50,000 Units by mouth every 7 (seven) days. THURSDAYS), Disp: 4 capsule, Rfl: 11 .  Continuous Blood Gluc Receiver (FREESTYLE LIBRE 14 DAY READER) DEVI, Use to check BS 4 times daily, Disp: 2 each, Rfl: 2 .  Continuous Blood Gluc Sensor (FREESTYLE LIBRE 14 DAY SENSOR) MISC, Use to check BS 4 times daily, Disp: 2 each, Rfl: 2 .  HYDROcodone-acetaminophen (NORCO) 10-325 MG tablet, Take 1 tablet by mouth every 6 (six) hours as needed., Disp: 30 tablet, Rfl: 0 .  insulin aspart (NOVOLOG) 100 UNIT/ML injection, Inject 5 Units into the skin 3 (three) times daily before meals., Disp: 10 mL, Rfl: PRN .  ondansetron (ZOFRAN) 8 MG tablet, One pill every 8 hours as needed for nausea/vomitting., Disp: 40 tablet, Rfl: 1 .  prochlorperazine (COMPAZINE) 10 MG tablet, Take 1 tablet (10 mg total) by mouth every 6 (six) hours as needed for nausea or vomiting., Disp: 40 tablet, Rfl: 1 .  senna-docusate (SENOKOT-S) 8.6-50 MG tablet, Take 2 tablets by mouth daily as needed for mild constipation. (Patient not taking: Reported on 03/08/2020), Disp: 30 tablet, Rfl: 1  EXAM:   General impression: alert, cooperative and articulate.  No signs of being in distress  Lungs: speech is fluent sentence length suggests that patient is not short of breath and not punctuated by cough, sneezing or sniffing. Marland Kitchen   Psych: affect normal.  speech is articulate and non pressured .  Denies suicidal thoughts   ASSESSMENT AND PLAN:  Discussed the following assessment and plan:  Controlled type 2 diabetes mellitus with diabetic nephropathy, without long-term current use of insulin (HCC)  Atrophic kidney  Dyslipidemia associated with type 2 diabetes mellitus (HCC)  Aortic arch atherosclerosis  (HCC)  Abdominal aortic aneurysm (AAA) without rupture (HCC)  Cirrhosis, non-alcoholic (HCC)  Pelvic mass in female  Chronic right-sided thoracic back pain  Type 2 diabetes mellitus, controlled, with renal complications ([place]) Readings since starting lantus 15 units  daily  Fasting 154 to 186  More recently 116 to 144 .Marland Kitchen  Nighttime 126  Readings .   Dyslipidemia associated with type 2 diabetes mellitus Improving control with Lantus and dc steroids. Stopping metformin due to new complaint of nausea.  Would not try XR given plans for chemotherapy and anticipation of worsening nausea.  Continue lantus 15 units daily,  Will add novolog at mealtime once pre and post mealtime BS can be reviewed.  FreeStyle [place] monitor needed along with RN visit for instruction and CCM referral for med management.   Lab Results  Component Value Date   HGBA1C 7.3 (H) 02/02/2020     Aortic arch atherosclerosis ([place]) Reviewed findings of PET scan  today..  Patient is tolerating statin therapy   Aortic aneurysm (HCC) Incidental finding on previous CT.  4.1 cm.  Continue strict BP control.  Deferring vascular surgery referral given frequent monitoring to occur with treatment of lung CA   Cirrhosis, non-alcoholic ([place]) She has had screening annual screening due to October 1 admission . AFP tumor marker was 8.2 in 2019 . Synthetic function remains normal.    Pelvic mass in female Marlana Salvage has a slow growing soft tissue mass first seen on CT done Nov 2020 during urologic workup for atrophic left kidney  It is presumed to be benign based on low SUV by recent t PET scan. The mass has grown in size over the past 2 years based on measurements.   Back pain, chronic Her pain has worsened since the steroids were stopped. She has severe DDD by prior thoracic spine  MRI.  Oncology noted a lg fever of 100.1 during visit and is ruling out another UTI.  rx Vicodin for use bid with tylenol in between (1350 mg max additional).   Referral to [facility] discussed for ESI if pain is not controlled with low dose opioids     I discussed the assessment and treatment plan with the patient. The patient was provided an opportunity to ask questions and all were answered. The patient agreed with the plan and demonstrated an understanding of the instructions.   The patient was advised to call back or seek an in-person evaluation if the symptoms worsen or if the condition fails to improve as anticipated.  I provided 30 minutes of non-face-to-face time during this encounter.   [name], MD

## 2020-03-18 NOTE — Progress Notes (Signed)
Pt states she has not been feeling well felling well since yesterday. Has not wanted to eat much. Has a low grade fever today.

## 2020-03-18 NOTE — Assessment & Plan Note (Addendum)
#  Non-small cell lung cancer/adenocarcinoma-  left lung mass/invading the mediastinum; Stage IV [brain metastases]; QNS for NGS; GUARDANT-NEGATIVE.  NOV- PET scan- NO DISTANT METASTATIC DISEASE. PD-L1-95%.   #Proceed with palliative radiation to left upper lobe mass; plan to start 11/18 [6 weeks].  Hold off any additional/concurrent chemotherapy.     Discussed that patient be candidate for immunotherapy based on PD-L1 status post radiation therapy. I discussed the mechanism of action; The goal of therapy is palliative; and length of treatments are likely ongoing/based upon the results of the scans. Discussed the potential side effects of immunotherapy including but not limited to diarrhea; skin rash; elevated LFTs/endocrine abnormalities etc.    #Seizure secondary to brain metastases/edema x2 [10 to 15 millimeters]; s/p SBRT 10/13; tapered off dexamethasone.  We will plan to get MRI approximately 3 months post treatment; mid Jan 2022.   # DM-on insulin.  Better controlled as patient is tapered off steroids.  Monitor closely.   #Worsening back pain-likely degenerative as no metastasis noted on recent PET scan.  Unlikely presacral benign lesion as cause of her pain.  Discussed that this is reasonable to be evaluated by pain management/orthopedics for steroid injections.  Patient let us know  # Solitary kidney-normal renal function.  Stable  #Bilateral leg swelling--improved not resolved.  Secondary steroidsRecommend leg elevation/compression; currently off steroids.  #Low-grade fever-question tumor fever rather than infectious cause.  Order UA and culture.  # DISPOSITION: # UA/culture today # follow up in 1 month- MD; labs- cbc/cmp-Dr.B  Addendum: Message left for the patient's daughter [name] regarding the results of the PD-L1; however no change in treatment plan as patient is currently awaiting to start radiation this week.

## 2020-03-18 NOTE — Assessment & Plan Note (Signed)
Readings since starting lantus 15 units daily  Fasting 154 to 186  More recently 116 to 144 .Marland Kitchen  Nighttime 126  Readings .

## 2020-03-18 NOTE — Progress Notes (Signed)
[facility]  [phone] [phone]   Name: Kimberly Huff Date: 03/18/2020 MRN: [id number]  DOB: 05-02-1946  Patient Care Team: Kimberly Mc, MD as PCP - General (Internal Medicine) Kimberly Lye, MD (General Surgery) Kimberly Mc, MD (Internal Medicine) Kimberly Nab, RN as Oncology Nurse Navigator    REASON FOR CONSULTATION: Kimberly Huff is a 74 y.o. 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  Patient was hospitalized 02/02/2020-02/05/2020 with weakness and slurred speech and was found to have 8 cm left lung mass in 2 brain lesions consistent with metastatic disease.  She has had pain and insomnia.  Palliative care was consulted to help address goals and manage ongoing symptoms.  SOCIAL HISTORY:     reports that she quit smoking about 21 years ago. Her smoking use included cigarettes. She has a 19.00 pack-year smoking history. She has never used smokeless tobacco. She reports previous alcohol use. She reports that she does not use drugs.  Patient is unmarried. She was at home alone. She has a daughter in [place], [place] and a son and [place], [place]. Patient retired from the [employer] and then later worked at [employer].   ADVANCE DIRECTIVES:  On file  CODE STATUS: DNR/DNI (DNR order signed on 03/18/20)  PAST MEDICAL HISTORY: Past Medical History:  Diagnosis Date  . Allergy   . Atrophic kidney    left  . Blood clot in vein 45 yrs ago   inabdoment after surgery  . Blood transfusion without reported diagnosis   . Cataract   . Complication of anesthesia   . Coronary artery disease   . Coronary atherosclerosis   . Diabetes mellitus   . Diverticulosis 2005   Dr Kimberly Huff  . Dyspnea    with exertion  .  External hemorrhoids without mention of complication 6147  . GERD (gastroesophageal reflux disease)   . H/O cardiac catheterization 2005  . Heart murmur 2012   found by Dr. Jamal Huff  . Hyperlipidemia   . Hypertension   . Kidney stone   . Lesion of bladder   . Myocardial infarction ([place]) 2000  . Neuromuscular disorder ([place])    "achy muscles"  . Obesity   . Osteoarthritis   . PONV (postoperative nausea and vomiting)   . Recurrent UTI   . Seizure ([place])   . Spinal stenosis of cervical region   . Steatohepatitis 05/30/10   Brunt Stage 3, non alcoholic cirrhosis of the liver  . Type II or unspecified type diabetes mellitus without mention of complication, uncontrolled    Patient does takes Metformin and recommended to stop for 48 hours.  Kimberly Huff Ulcer   . Unspecified essential hypertension 2014    PAST SURGICAL HISTORY:  Past Surgical History:  Procedure Laterality Date  . ABDOMINAL HYSTERECTOMY  1982   complete  . BREAST CYST ASPIRATION Right 1990's   benign  . BREAST MASS EXCISION     benign  . CARDIAC CATHETERIZATION  2000   cardiac stent  . cardiac stents  2000  . CATARACT EXTRACTION     both eyes  . COLONOSCOPY  2004, 2015   Dr. Jamal Huff, Dr. Olevia Perches  . CYSTOSCOPY W/ RETROGRADES Left 11/12/2015   Procedure: CYSTOSCOPY WITH RETROGRADE PYELOGRAM;  Surgeon: Kimberly Gustin, MD;  Location: [facility]  ORS;  Service: Urology;  Laterality: Left;  . CYSTOSCOPY W/ URETERAL STENT PLACEMENT Left 11/12/2015   Procedure: CYSTOSCOPY WITH STENT REPLACEMENT;  Surgeon: Kimberly Gustin, MD;  Location: [facility] ORS;  Service: Urology;  Laterality: Left;  . CYSTOSCOPY WITH BIOPSY N/A 09/26/2019   Procedure: CYSTOSCOPY WITH BIOPSY AND FULGURATION;  Surgeon: Kimberly Gustin, MD;  Location: [facility];  Service: Urology;  Laterality: N/A;  . CYSTOSCOPY WITH URETEROSCOPY AND STENT PLACEMENT Left 10/15/2015   Procedure: CYSTOSCOPY WITH URETEROSCOPY AND STENT PLACEMENT;  Surgeon: Kimberly Gustin, MD;  Location: [facility] ORS;  Service: Urology;  Laterality: Left;  . EYE SURGERY Bilateral 2012   cataract  . FOOT SURGERY  2008  . PTCA  2000   stent x 1  . ROBOTIC ASSITED PARTIAL NEPHRECTOMY Left 06/02/2019   Procedure: XI ROBOTIC ASSITED NEPHRECTOMY;  Surgeon: Kimberly Gustin, MD;  Location: [facility] ORS;  Service: Urology;  Laterality: Left;  . SALPINGOOPHORECTOMY    . TONSILLECTOMY    . UPPER GI ENDOSCOPY    . URETEROSCOPY WITH HOLMIUM LASER LITHOTRIPSY Left 11/12/2015   Procedure: URETEROSCOPY WITH HOLMIUM LASER LITHOTRIPSY;  Surgeon: Kimberly Gustin, MD;  Location: [facility] ORS;  Service: Urology;  Laterality: Left;  . URETEROTOMY  1960/1969   x 2 during childhood  . VAGINAL HYSTERECTOMY    . VIDEO BRONCHOSCOPY WITH ENDOBRONCHIAL NAVIGATION N/A 02/21/2020   Procedure: VIDEO BRONCHOSCOPY WITH ENDOBRONCHIAL NAVIGATION;  Surgeon: Kimberly Glazier, MD;  Location: [facility] ORS;  Service: Thoracic;  Laterality: N/A;  . VIDEO BRONCHOSCOPY WITH ENDOBRONCHIAL ULTRASOUND N/A 02/21/2020   Procedure: VIDEO BRONCHOSCOPY WITH ENDOBRONCHIAL ULTRASOUND;  Surgeon: Kimberly Glazier, MD;  Location: [facility] ORS;  Service: Thoracic;  Laterality: N/A;    HEMATOLOGY/ONCOLOGY HISTORY:  Oncology History Overview Note  # OCT 2021-left upper lobe lung mass invading mediastinum; bronc/Bx-  Oct 20th [Dr.Aleskerov]; non-small cell carcinoma/adenocarcinoma.  Stage IV [brain metastases]; awaiting on NGS.  #Right frontal/temporal brain met x2 [1-1.39m]-seizure- on dex; Keppra; SBRT s/p Oct 13th, 2021  # DM on OHA; HTN; SOLITARY KIDNEY [frequent infections/ atrophic- s/p nephrectomy 2020]  # NGS/MOLECULAR TESTS:Pending    # PALLIATIVE CARE EVALUATION:  # PAIN MANAGEMENT:    DIAGNOSIS: Lung cancer  STAGE:   IV      ;  GOALS: Palliative  CURRENT/MOST RECENT THERAPY :     Brain metastasis ([facility]  02/02/2020 Initial Diagnosis   Brain metastasis ([facility]   Primary cancer of left upper lobe of lung ([facility]    02/26/2020 Initial Diagnosis   Primary cancer of left upper lobe of lung ([facility])     ALLERGIES:  has No Known Allergies.  MEDICATIONS:  Current Outpatient Medications  Medication Sig Dispense Refill  . denosumab (PROLIA) 60 MG/ML SOSY injection Inject into the skin.    . furosemide (LASIX) 40 MG tablet TAKE ONE TABLET EVERY DAY (Patient taking differently: Take 40 mg by mouth daily. ) 90 tablet 1  . HYDROcodone-acetaminophen (NORCO) 10-325 MG tablet Take 1 tablet by mouth every 6 (six) hours as needed. 30 tablet 0  . Insulin Pen Needle 32G X 6 MM MISC 1 Stick by Does not apply route daily. 100 each 1  . LANTUS SOLOSTAR 100 UNIT/ML Solostar Pen INJECT 20 UNITS INTO THE SKIN DAILY AS DIRECTED (Patient taking differently: Inject 15 Units into the skin daily. ) 15 mL PRN  . levETIRAcetam (KEPPRA) 500 MG tablet Take 1 tablet (500 mg total) by mouth 2 (two) times daily. 60 tablet 0  .  losartan (COZAAR) 50 MG tablet TAKE ONE TABLET EVERY DAY (Patient taking differently: Take 50 mg by mouth every morning. ) 90 tablet 3  . omeprazole (PRILOSEC) 40 MG capsule TAKE 1 CAPSULE BY MOUTH ONCE DAILY (Patient taking differently: Take 40 mg by mouth every morning. ) 90 capsule 1  . ONETOUCH ULTRA test strip USE AS INSTRUCTED 100 each 12  . rosuvastatin (CRESTOR) 10 MG tablet TAKE ONE TABLET EVERY DAY (Patient taking differently: Take 10 mg by mouth at bedtime. ) 90 tablet 1  . senna-docusate (SENOKOT-S) 8.6-50 MG tablet Take 2 tablets by mouth daily as needed for mild constipation. (Patient not taking: Reported on 03/08/2020) 30 tablet 1  . traZODone (DESYREL) 50 MG tablet Take 1 tablet (50 mg total) by mouth at bedtime as needed for sleep. 30 tablet 3  . Vitamin D, Ergocalciferol, (DRISDOL) 1.25 MG (50000 UNIT) CAPS capsule TAKE ONE CAPSULE EACH WEEK (Patient taking differently: Take 50,000 Units by mouth every 7 (seven) days. THURSDAYS) 4 capsule 11   No current facility-administered medications for this  visit.    VITAL SIGNS: There were no vitals taken for this visit. There were no vitals filed for this visit.  Estimated body mass index is 33.48 kg/m as calculated from the following:   Height as of an earlier encounter on 03/18/20: 5' 3" (1.6 m).   Weight as of an earlier encounter on 03/18/20: 189 lb (85.7 kg).  LABS: CBC:    Component Value Date/Time   WBC 7.5 03/18/2020 1509   HGB 12.0 03/18/2020 1509   HCT 36.7 03/18/2020 1509   PLT 158 03/18/2020 1509   MCV 83.2 03/18/2020 1509   NEUTROABS 3.5 03/18/2020 1509   LYMPHSABS 3.2 03/18/2020 1509   MONOABS 0.6 03/18/2020 1509   EOSABS 0.2 03/18/2020 1509   BASOSABS 0.0 03/18/2020 1509   Comprehensive Metabolic Panel:    Component Value Date/Time   NA 134 (L) 03/04/2020 1113   NA 140 12/15/2012 0000   K 4.0 03/04/2020 1113   CL 98 03/04/2020 1113   CO2 26 03/04/2020 1113   BUN 32 (H) 03/04/2020 1113   BUN 22 (A) 12/15/2012 0000   CREATININE 1.08 (H) 03/04/2020 1113   CREATININE 0.85 12/23/2018 1428   GLUCOSE 118 (H) 03/04/2020 1113   CALCIUM 9.3 03/04/2020 1113   AST 26 03/04/2020 1113   ALT 48 (H) 03/04/2020 1113   ALKPHOS 33 (L) 03/04/2020 1113   BILITOT 1.1 03/04/2020 1113   PROT 6.7 03/04/2020 1113   ALBUMIN 4.0 03/04/2020 1113    RADIOGRAPHIC STUDIES: DG Ankle 2 Views Right  Result Date: 03/13/2020 CLINICAL DATA:  Injury.  Pain and swelling EXAM: RIGHT ANKLE - 2 VIEW COMPARISON:  None. FINDINGS: Bilateral soft tissue swelling. Normal alignment no fracture. No significant arthropathy. Mild spurring on the plantar surface of the calcaneus. IMPRESSION: Diffuse soft tissue swelling.  Negative for fracture. Electronically Signed   By: Franchot Gallo M.D.   On: 03/13/2020 16:53   CT CHEST WO CONTRAST  Result Date: 02/19/2020 CLINICAL DATA:  74 year old female with stage IV lung cancer. Follow-up CT. EXAM: CT CHEST WITHOUT CONTRAST TECHNIQUE: Multidetector CT imaging of the chest was performed following the  standard protocol without IV contrast. COMPARISON:  Chest CT dated 02/02/2020. FINDINGS: Evaluation of this exam is limited in the absence of intravenous contrast. Cardiovascular: There is no cardiomegaly or pericardial effusion. Three-vessel coronary vascular calcification. Top-normal ascending aorta measure up to 3.9 cm. There is moderate atherosclerotic calcification of the  thoracic aorta. The central pulmonary arteries are grossly unremarkable. Mediastinum/Nodes: No hilar or mediastinal adenopathy. Esophagus and the thyroid gland are grossly unremarkable. No mediastinal fluid collection. Lungs/Pleura: Left upper lobe lobulated mass abutting the mediastinal pleural measures approximately 6.6 x 3.4 cm (previously 7.5 x 4.4 cm measured in similar plane). A 5 mm nodule in the left upper lobe (55/3) appears similar to prior CT. There is a 13 mm nodule in the right lower lobe along the fissure, similar to prior CT. A cluster of nodular density in the right upper lobe measure approximately 1 cm in combined dimension (previously 1.2 cm). No consolidative changes. There is no pleural effusion pneumothorax. The central airways are patent. Upper Abdomen: Slight irregularity of the liver contour, likely early changes of cirrhosis. Trace free fluid adjacent to the spleen. Musculoskeletal: Osteopenia with degenerative changes of the spine. No acute osseous pathology. IMPRESSION: 1. Interval decrease in the size of the left upper lobe lobulated mass. 2. Bilateral pulmonary nodules as described above, similar to prior CT. 3. No hilar or mediastinal adenopathy. 4. Aortic Atherosclerosis (ICD10-I70.0). Electronically Signed   By: Anner Crete M.D.   On: 02/19/2020 20:37   NM PET Image Initial (PI) Skull Base To Thigh  Result Date: 03/13/2020 CLINICAL DATA:  Initial treatment strategy for left upper lobe lung cancer. EXAM: NUCLEAR MEDICINE PET SKULL BASE TO THIGH TECHNIQUE: 9.4 mCi F-18 FDG was injected intravenously.  Full-ring PET imaging was performed from the skull base to thigh after the radiotracer. CT data was obtained and used for attenuation correction and anatomic localization. Fasting blood glucose: 116 mg/dl COMPARISON:  Chest CT 02/19/2020 FINDINGS: Mediastinal blood pool activity: SUV max 2.9 Liver activity: SUV max NA NECK: The patient has known intracranial metastatic lesions are fairly inapparent on today's PET-CT. No hypermetabolic adenopathy in the neck. Incidental CT findings: Bilateral common carotid atherosclerotic calcification. CHEST: The medial left upper lobe mass measures 7.0 by 3.6 cm on image 78 of series 3 and has maximum SUV of 14.2. This mass abuts the mediastinal margin and probably extend slightly into the AP window region of the mediastinum. No overtly pathologic adenopathy is identified. A 0.8 cm lower right paratracheal lymph node on image 87 of series 3 has a maximum SUV of 2.0 which is near blood pool. Sub solid 0.8 by 0.8 cm right upper lobe pulmonary nodule on image 79 of series 3, maximum SUV 0.9, not significantly hypermetabolic but merit surveillance. There is some probable linear scarring in the right lower lobe adjacent to the major fissure on image 95 of series 3 with slightly nodular component but without hypermetabolic activity. Incidental CT findings: Coronary, aortic arch, and branch vessel atherosclerotic vascular disease. Mild cardiomegaly. Ascending aortic aneurysm 4.1 cm in diameter on image 95 of series 3. ABDOMEN/PELVIS: Physiologic activity in bowel, particularly rectum, without a readily appreciable rectal mass. A presacral lesion with hazy fatty elements measuring 4.6 by 3.3 cm on image 217 of series 3 has a maximum SUV of 2.7. This is probably a myelolipoma with entities such as liposarcoma and extramedullary hematopoiesis considered possible but less likely. Photopenic right mid to lower renal cyst anteriorly. Absent left kidney. Incidental CT findings: Aortoiliac  atherosclerotic vascular disease. SKELETON: No significant abnormal hypermetabolic activity in this region. Incidental CT findings: Healed deformity of the right proximal humerus. Lumbar spondylosis and degenerative disc disease. Grade 1 degenerative anterolisthesis at L4-5. IMPRESSION: 1. Hypermetabolic 7.0 cm medial left upper lobe mass, maximum SUV 14.2. This may slightly extend into  the AP window region. No definite pathologic adenopathy. 2. Sub solid 0.8 cm right upper lobe nodule is not substantially hypermetabolic but merit surveillance. 3. 4.6 cm in long axis presacral lesion with hazy fatty elements, probably a myelolipoma, less likely be liposarcoma or extramedullary hematopoiesis. In light of the patient's known metastatic lung cancer, I would suggest surveillance of this lesion on follow up imaging to ensure lack of enlargement/progression, with presumption that this is most likely benign. 4. Known intracranial metastatic lesions are not well seen on today's PET-CT, but would be better followed by MRI. 5. Ascending thoracic aortic aneurysm 4.1 cm in diameter. This could probably be followed in the context of the patient's follow up cancer imaging. Otherwise, we recommend annual imaging followup by CTA or MRA. This recommendation follows 2010 ACCF/AHA/AATS/ACR/ASA/SCA/SCAI/SIR/STS/SVM Guidelines for the Diagnosis and Management of Patients with Thoracic Aortic Disease. Circulation. 2010; 121: O160-V371. Aortic aneurysm NOS (ICD10-I71.9) 6. Other imaging findings of potential clinical significance: Aortic Atherosclerosis (ICD10-I70.0). Coronary atherosclerosis. Mild cardiomegaly. Lumbar spondylosis and degenerative disc disease. Electronically Signed   By: Van Clines M.D.   On: 03/13/2020 09:36   DG Foot Complete Right  Result Date: 03/13/2020 CLINICAL DATA:  Right foot injury with pain and swelling EXAM: RIGHT FOOT COMPLETE - 3+ VIEW COMPARISON:  None. FINDINGS: Negative for fracture  Degenerative change in the fifth MTP. Moderate hallux valgus with degenerative change in the first MTP. Spurring of the plantar surface the calcaneus. IMPRESSION: Negative for fracture. Electronically Signed   By: Franchot Gallo M.D.   On: 03/13/2020 17:00   DG C-Arm 1-60 Min-No Report  Result Date: 02/21/2020 Fluoroscopy was utilized by the requesting physician.  No radiographic interpretation.    PERFORMANCE STATUS (ECOG) : 1 - Symptomatic but completely ambulatory  Review of Systems Unless otherwise noted, a complete review of systems is negative.  Physical Exam General: NAD Pulmonary: Unlabored Extremities: no edema, no joint deformities Skin: no rashes  Neurological: Weakness but otherwise nonfocal  IMPRESSION: Met with patient and her daughter today. I introduced palliative care services and attempted to establish therapeutic rapport.  Patient seems to have a reasonable understanding of her current diagnosis. Plan is for palliative XRT to the lung.  Patient states that she is met with an attorney and completed financial and healthcare POA documents. Her son is managing her finances and her daughter is managing healthcare decisions.  Symptomatically, patient has back pain. She was started earlier today on Norco by PCP. Patient reports having tolerated Norco in the past. We discussed use and administration of opioid analgesics. Also discussed management of possible opioid-induced constipation.  Patient reports that she was having insomnia but that has improved with use of trazodone.  She has felt more fatigue and has had an intermittent fever over the past several days. Fever is thought to be secondary to tumor burden.  Discussed CODE STATUS. Patient states clearly that she would not want to be resuscitated nor have her life prolonged artificially on machines. She is in agreement with DNR/DNI. I completed a DNR order for her to take home.  PLAN: -Continue current scope of  treatment -DNR/DNI -Agree with Norco as needed for pain -Prophylactic bowel regimen -RTC in 1 month  Case and plan discussed with Dr. Rogue Bussing  Patient expressed understanding and was in agreement with this plan. She also understands that She can call the clinic at any time with any questions, concerns, or complaints.     Time Total: 30 minutes  Visit consisted of  counseling and education dealing with the complex and emotionally intense issues of symptom management and palliative care in the setting of serious and potentially life-threatening illness.Greater than 50%  of this time was spent counseling and coordinating care related to the above assessment and plan.  Signed by: Altha Harm, PhD, NP-C

## 2020-03-19 ENCOUNTER — Ambulatory Visit: Payer: PPO | Admitting: Pharmacist

## 2020-03-19 ENCOUNTER — Telehealth: Payer: Self-pay | Admitting: *Deleted

## 2020-03-19 ENCOUNTER — Other Ambulatory Visit: Payer: Self-pay | Admitting: Neurosurgery

## 2020-03-19 ENCOUNTER — Other Ambulatory Visit (HOSPITAL_COMMUNITY): Payer: Self-pay | Admitting: Neurosurgery

## 2020-03-19 ENCOUNTER — Telehealth: Payer: Self-pay

## 2020-03-19 ENCOUNTER — Other Ambulatory Visit: Payer: Self-pay | Admitting: Internal Medicine

## 2020-03-19 DIAGNOSIS — I10 Essential (primary) hypertension: Secondary | ICD-10-CM

## 2020-03-19 DIAGNOSIS — I700 Atherosclerosis of aorta: Secondary | ICD-10-CM | POA: Insufficient documentation

## 2020-03-19 DIAGNOSIS — E1121 Type 2 diabetes mellitus with diabetic nephropathy: Secondary | ICD-10-CM

## 2020-03-19 DIAGNOSIS — E1169 Type 2 diabetes mellitus with other specified complication: Secondary | ICD-10-CM

## 2020-03-19 DIAGNOSIS — I671 Cerebral aneurysm, nonruptured: Secondary | ICD-10-CM

## 2020-03-19 DIAGNOSIS — I719 Aortic aneurysm of unspecified site, without rupture: Secondary | ICD-10-CM | POA: Insufficient documentation

## 2020-03-19 DIAGNOSIS — E785 Hyperlipidemia, unspecified: Secondary | ICD-10-CM

## 2020-03-19 MED ORDER — INSULIN ASPART 100 UNIT/ML ~~LOC~~ SOLN: 5.0000 [IU] | Freq: Three times a day (TID) | SUBCUTANEOUS | 99 refills | Status: DC

## 2020-03-19 MED ORDER — FREESTYLE LIBRE 14 DAY SENSOR MISC: 2 refills | Status: DC

## 2020-03-19 MED ORDER — FREESTYLE LIBRE 14 DAY READER DEVI: 2 refills | Status: AC

## 2020-03-19 NOTE — Assessment & Plan Note (Addendum)
Improving control with Lantus and dc steroids. Stopping metformin due to new complaint of nausea.  Would not try XR given plans for chemotherapy and anticipation of worsening nausea.  Continue lantus 15 units daily,  Will add novolog at mealtime once pre and post mealtime BS can be reviewed.  [facility] [place] [facility] monitor needed along with RN visit for instruction and CCM referral for med management.   Lab Results  Component Value Date   HGBA1C 7.3 (H) 02/02/2020

## 2020-03-19 NOTE — Telephone Encounter (Signed)
Please advise regarding UA results.

## 2020-03-19 NOTE — Telephone Encounter (Signed)
Per Dr. Jacinto Reap- will wait for urine culture results. Pt's daughter made aware.

## 2020-03-19 NOTE — Chronic Care Management (AMB) (Signed)
  Chronic Care Management   Note  03/19/2020 Name: PAGE PUCCIARELLI MRN: [id number] DOB: 30-Jun-1945  [name] is a 74 y.o. year old female who is a primary care patient of Derrel Nip, Aris Everts, MD. I reached out to [name] by phone today in response to a referral sent by Ms. Gerald Dexter PCP, Dr. Derrel Nip     Ms. Schaffert was given information about Chronic Care Management services today including:  1. CCM service includes personalized support from designated clinical staff supervised by her physician, including individualized plan of care and coordination with other care providers 2. 24/7 contact phone numbers for assistance for urgent and routine care needs. 3. Service will only be billed when office clinical staff spend 20 minutes or more in a month to coordinate care. 4. Only one practitioner may furnish and bill the service in a calendar month. 5. The patient may stop CCM services at any time (effective at the end of the month) by phone call to the office staff. 6. The patient will be responsible for cost sharing (co-pay) of up to 20% of the service fee (after annual deductible is met).  Patient agreed to services and verbal consent obtained.   Follow up plan: Telephone appointment with care management team member scheduled for:03/19/2020  Noreene Larsson, [place], [place], [place] [address] Direct Dial: [phone] [email or web address]

## 2020-03-19 NOTE — Assessment & Plan Note (Signed)
Incidental finding on previous CT.  4.1 cm.  Continue strict BP control.  Deferring vascular surgery referral given frequent monitoring to occur with treatment of lung CA

## 2020-03-19 NOTE — Patient Instructions (Signed)
Kimberly Huff,   It was great talking to you today!  Bring your reader, sensors, and cell phone to the office to the nurse visit for [employer] teaching. Before you come, see if you can download the [employer] app on your phone. If so, go ahead and set up an account (with an email and password). If you can use your phone to check blood sugars, then we can connect you with our patient portal and you do not need to come in physically for our next appointment.   Call me with any questions!  Catie Darnelle Maffucci, PharmD [phone]    Visit Information Patient Care Plan: Medication Management    Problem Identified: Diabetes, Cancer     Long-Range Goal: Disease State Management   Start Date: 03/19/2020  This Visit's Progress: On track  Priority: High  Note:   Current Barriers:  . Unable to achieve control of diabetes  . Recent complex diagnosis  Pharmacist Clinical Goal(s):  Marland Kitchen Over the next 90 days, patient will achieve control of diabetes as evidenced by improvement in A1c.  Interventions: . Inter-disciplinary care team collaboration (see longitudinal plan of care) . Comprehensive medication review performed; medication list updated in electronic medical record  Diabetes: . Uncontrolled; current treatment: Lantus 15 units daily; metformin 750 mg recently discontinued d/t new nausea. Novolog prescribed, but patient has not picked up yet  . Current glucose readings: fasting glucose: 150-220s; PCP prescribed FreeStyle Libre 2 CGM. Patient picked up from the pharmacy . Denies hypoglycemic symptoms. Variable meal schedule lately due to decreased appetite . Educated on benefits of CGM . Scheduled for CGM teaching visit. Continue Lantus 15 units daily, do not start Novolog at this time until more data is available from CGM.   Hypertension: . Controlled; current treatment: losartan 50 mg daily, furosemide 40 mg daily  . Recommended to continue current regimen  Hyperlipidemia: . Controlled; current  treatment: rosuvastatin 10 mg daily . Recommended to continue current treatment regimen  Lung Cancer, Symptom Management: . Seizure (brain metastasis): levetiracetam 500 mg BID . Nausea: ondansetron 4 mg PRN, procholorperazine PRN. Patient denies need for these recently . Pain: hydrocodone/acetaminophen 10/325 mg Q6H PRN. Patient notes she hasn't taken this yet. Aware that Senokot would be best option for any associated constipation . Mood/sleep: trazodone 50 mg daily  GERD: . Controlled per patient report. Current treatment: omeprazole 40 mg daily . Recommended to continue current regimen  Patient Goals/Self-Care Activities . Over the next 90 days, patient will:  - check blood glucose using CGM at least TID, document, and provide at future appointments  Follow Up Plan: Face to Face appointment with care management team member scheduled for:  ~4 weeks      Kimberly Huff was given information about Chronic Care Management services today including:  1. CCM service includes personalized support from designated clinical staff supervised by her physician, including individualized plan of care and coordination with other care providers 2. 24/7 contact phone numbers for assistance for urgent and routine care needs. 3. Service will only be billed when office clinical staff spend 20 minutes or more in a month to coordinate care. 4. Only one practitioner may furnish and bill the service in a calendar month. 5. The patient may stop CCM services at any time (effective at the end of the month) by phone call to the office staff. 6. The patient will be responsible for cost sharing (co-pay) of up to 20% of the service fee (after annual deductible is met).  Patient agreed to services and verbal consent obtained.   The patient verbalized understanding of instructions, educational materials, and care plan provided today and agreed to receive a mailed copy of patient instructions, educational materials, and  care plan.   Plan: Face to Face appointment with care management team member scheduled for:  ~ 4 weeks. Will change to phone visit if patient is able to utilize [employer] on her phone  Catie Darnelle Maffucci, PharmD, [place], [place] Pharmacist [facility] [phone]

## 2020-03-19 NOTE — Assessment & Plan Note (Addendum)
She has had screening annual screening due to October 1 admission . AFP tumor marker was 8.2 in 2019 . Synthetic function remains normal.

## 2020-03-19 NOTE — Assessment & Plan Note (Signed)
Reviewed findings of PET scan  today..  Patient is tolerating statin therapy

## 2020-03-19 NOTE — Assessment & Plan Note (Signed)
/  She has a slow growing soft tissue mass first seen on CT done Nov 2020 during urologic workup for atrophic left kidney  It is presumed to be benign based on low SUV by recent t PET scan. The mass has grown in size over the past 2 years based on measurements.

## 2020-03-19 NOTE — Assessment & Plan Note (Signed)
Her pain has worsened since the steroids were stopped. She has severe DDD by prior thoracic spine  MRI.  Oncology noted a lg fever of 100.1 during visit and is ruling out another UTI.  rx Vicodin for use bid with tylenol in between (1350 mg max additional).  Referral to [facility] discussed for ESI if pain is not controlled with low dose opioids

## 2020-03-19 NOTE — Chronic Care Management (AMB) (Signed)
Chronic Care Management   Pharmacy Note  03/19/2020 Name: Kimberly Huff MRN: [id number] DOB: 19-Sep-1945   Subjective:  Kimberly Huff is a 74 y.o. year old female who is a primary care patient of Derrel Nip, Aris Everts, MD. The CCM team was consulted for assistance with chronic disease management and care coordination needs.    Engaged with patient by telephone for initial visit in response to provider referral for pharmacy case management and/or care coordination services.   Consent to Services:  Kimberly Huff was given the following information about Chronic Care Management services today, agreed to services, and gave verbal consent: 1.CCM service includes personalized support from designated clinical staff supervised by her physician, including individualized plan of care and coordination with other care providers 2. 24/7 contact phone numbers for assistance for urgent and routine care needs. 3. Service will only be billed when office clinical staff spend 20 minutes or more in a month to coordinate care. 4. Only one practitioner may furnish and bill the service in a calendar month. 5.The patient may stop CCM services at any time (effective at the end of the month) by phone call to the office staff. 6. The patient will be responsible for cost sharing (co-pay) of up to 20% of the service fee (after annual deductible is met).  Review of patient status, including review of consultants reports, laboratory and other test data, was performed as part of comprehensive evaluation and provision of chronic care management services.   SDOH (Social Determinants of Health) assessments and interventions performed:  SDOH Interventions     Most Recent Value  SDOH Interventions  Financial Strain Interventions Intervention Not Indicated  Stress Interventions Intervention Not Indicated       Objective:  Lab Results  Component Value Date   CREATININE 0.84 03/18/2020   CREATININE 1.08 (H) 03/04/2020   CREATININE  0.93 02/26/2020    Lab Results  Component Value Date   HGBA1C 7.3 (H) 02/02/2020       Component Value Date/Time   CHOL 101 01/29/2020 0856   TRIG 201.0 (H) 01/29/2020 0856   HDL 25.20 (L) 01/29/2020 0856   CHOLHDL 4 01/29/2020 0856   VLDL 40.2 (H) 01/29/2020 0856   LDLCALC 38 10/04/2019 0802   LDLDIRECT 58.0 01/29/2020 0856    Clinical ASCVD: Yes  The ASCVD Risk score (Goff DC Jr., et al., 2013) failed to calculate for the following reasons:   The patient has a prior MI or stroke diagnosis     BP Readings from Last 3 Encounters:  03/18/20 107/76  03/18/20 135/78  03/04/20 119/71    Assessment:   No Known Allergies  Medications Reviewed Today    Reviewed by [name], RPH-CPP (Pharmacist) on 03/19/20 at 1516  Med List Status: <None>  Medication Order Taking? Sig Documenting Provider Last Dose Status Informant  Continuous Blood Gluc Receiver (FREESTYLE LIBRE 14 DAY READER) [place] [id number] Yes Use to check BS 4 times daily [name], MD Taking Active   Continuous Blood Gluc Sensor (FREESTYLE LIBRE 14 DAY SENSOR) [place] [id number] Yes Use to check BS 4 times daily [name], MD Taking Active   denosumab (PROLIA) 60 MG/ML SOSY injection [id number] Yes Inject into the skin. [provider] Taking Active   furosemide (LASIX) 40 MG tablet [id number] Yes TAKE ONE TABLET EVERY DAY  Patient taking differently: Take 40 mg by mouth daily.    [name], MD Taking Active Self  HYDROcodone-acetaminophen (NORCO) 10-325 MG tablet  [id number] Yes Take 1 tablet by mouth every 6 (six) hours as needed. [name], MD Taking Active   insulin aspart (NOVOLOG) 100 UNIT/ML injection [id number] No Inject 5 Units into the skin 3 (three) times daily before meals.  Patient not taking: Reported on 03/19/2020   [name], MD Not Taking Active   Insulin Pen Needle 32G X 6 MM MISC [id number] Yes 1 Stick by Does not apply route daily. [name], MD Taking  Active   LANTUS SOLOSTAR 100 UNIT/ML Solostar Pen [id number] Yes INJECT 20 UNITS INTO THE SKIN DAILY AS DIRECTED  Patient taking differently: Inject 15 Units into the skin daily.    [name], MD Taking Active   levETIRAcetam (KEPPRA) 500 MG tablet [id number] Yes Take 1 tablet (500 mg total) by mouth 2 (two) times daily. [name], MD Taking Active   losartan (COZAAR) 50 MG tablet [id number] Yes TAKE ONE TABLET EVERY DAY [name], MD Taking Active Self  omeprazole (PRILOSEC) 40 MG capsule [id number] Yes TAKE 1 CAPSULE BY MOUTH ONCE DAILY [name], MD Taking Active Self  ondansetron (ZOFRAN) 8 MG tablet [id number] No One pill every 8 hours as needed for nausea/vomitting.  Patient not taking: Reported on 03/19/2020   [name], MD Not Taking Active   [facility] ULTRA test strip [id number] Yes USE AS INSTRUCTED [name], MD Taking Active   prochlorperazine (COMPAZINE) 10 MG tablet [id number] No Take 1 tablet (10 mg total) by mouth every 6 (six) hours as needed for nausea or vomiting.  Patient not taking: Reported on 03/19/2020   [name], MD Not Taking Active   rosuvastatin (CRESTOR) 10 MG tablet [id number] Yes TAKE ONE TABLET EVERY DAY [name], MD Taking Active Self  senna-docusate (SENOKOT-S) 8.6-50 MG tablet [id number] No Take 2 tablets by mouth daily as needed for mild constipation.  Patient not taking: Reported on 03/08/2020   Cleon Gustin, MD Not Taking Active Self  traZODone (DESYREL) 50 MG tablet [id number] Yes Take 1 tablet (50 mg total) by mouth at bedtime as needed for sleep. [name], MD Taking Active   Vitamin D, Ergocalciferol, (DRISDOL) 1.25 MG (50000 UNIT) CAPS capsule [id number] Yes TAKE ONE CAPSULE EACH WEEK [name], MD Taking Active Self          Patient Active Problem List   Diagnosis Date Noted  . Aortic arch atherosclerosis ([place]) 03/19/2020  . Aortic aneurysm ([place]) 03/19/2020  .  Primary cancer of left upper lobe of lung ([place]) 02/26/2020  . Aneurysm of cavernous portion of right internal carotid artery 02/03/2020  . Brain mass 02/02/2020  . Brain metastasis ([place]) 02/02/2020  . Obesity (BMI 30.0-34.9)   . Back pain, chronic 01/22/2020  . OAB (overactive bladder) 12/13/2019  . Chronic cystitis 10/08/2019  . Pelvic mass in female 03/28/2019  . Thoracic spondylosis without myelopathy 03/28/2019  . Osteoporosis 11/12/2017  . Urge incontinence of urine 02/25/2016  . Hospital discharge follow-up 10/26/2015  . Benign essential HTN 10/25/2015  . Atrophic kidney 10/25/2015  . History of myocardial infarction 10/25/2015  . Microalbuminuria due to type 2 diabetes mellitus ([place]) 06/29/2015  . Personal history of gastric ulcer 02/23/2013  . Nontraumatic shoulder pain, right 02/23/2013  . Vitamin D deficiency 02/23/2013  . History of right humeral fracture  02/16/2012  . Gouty arthritis 08/25/2011  . Type 2 diabetes mellitus, controlled, with renal complications ([place]) 83/25/4982  . Hemorrhoid 05/27/2011  .  Dyslipidemia associated with type 2 diabetes mellitus ([place]) 02/16/2011  . Cataract extraction status 02/16/2011  . Nonfunctioning left kidney 02/16/2011  . Cirrhosis, non-alcoholic ([place]) 53/74/8270    Medication Assistance: None required. Patient affirms current coverage meets needs.   Patient Care Plan: Medication Management    Problem Identified: Diabetes, Cancer     Long-Range Goal: Disease State Management   Start Date: 03/19/2020  This Visit's Progress: On track  Priority: High  Note:   Current Barriers:  . Unable to achieve control of diabetes  . Recent complex diagnosis  Pharmacist Clinical Goal(s):  Marland Kitchen Over the next 90 days, patient will achieve control of diabetes as evidenced by improvement in A1c.  Interventions: . Inter-disciplinary care team collaboration (see longitudinal plan of care) . Comprehensive medication review performed; medication  list updated in electronic medical record  Diabetes: . Uncontrolled; current treatment: Lantus 15 units daily; metformin 750 mg recently discontinued d/t new nausea. Novolog prescribed, but patient has not picked up yet  . Current glucose readings: fasting glucose: 150-220s; PCP prescribed FreeStyle Libre 2 CGM. Patient picked up from the pharmacy . Denies hypoglycemic symptoms. Variable meal schedule lately due to decreased appetite . Educated on benefits of CGM . Scheduled for CGM teaching visit. Continue Lantus 15 units daily, do not start Novolog at this time until more data is available from CGM.   Hypertension: . Controlled; current treatment: losartan 50 mg daily, furosemide 40 mg daily  . Recommended to continue current regimen  Hyperlipidemia: . Controlled; current treatment: rosuvastatin 10 mg daily . Recommended to continue current treatment regimen  Lung Cancer, Symptom Management: . Seizure (brain metastasis): levetiracetam 500 mg BID . Nausea: ondansetron 4 mg PRN, procholorperazine PRN. Patient denies need for these recently . Pain: hydrocodone/acetaminophen 10/325 mg Q6H PRN. Patient notes she hasn't taken this yet. Aware that Senokot would be best option for any associated constipation . Mood/sleep: trazodone 50 mg daily  GERD: . Controlled per patient report. Current treatment: omeprazole 40 mg daily . Recommended to continue current regimen  Patient Goals/Self-Care Activities . Over the next 90 days, patient will:  - check blood glucose using CGM at least TID, document, and provide at future appointments  Follow Up Plan: Face to Face appointment with care management team member scheduled for:  ~4 weeks       Plan: Face to Face appointment with care management team member scheduled for:  ~ 4 weeks. Will change to phone visit if patient is able to utilize [employer] on her phone  Catie Darnelle Maffucci, PharmD, [place], [place] Pharmacist [facility] [employer] [phone]

## 2020-03-19 NOTE — Progress Notes (Signed)
  Oncology Nurse Navigator Documentation  Navigator Location: [facility]-Med Onc (03/18/20 1600)   )Navigator Encounter Type: Follow-up Appt (03/18/20 1600)                     Patient Visit Type: MedOnc (03/18/20 1600)   Barriers/Navigation Needs: No Barriers At This Time;No Needs (03/18/20 1600)   Interventions: None Required (03/18/20 1600)         met with patient during follow up visit with Dr. Rogue Bussing to finalize treatment plan. All questions answered during visit. Reviewed upcoming appts. Instructed to call back with any further questions or needs. Pt and daughter verbalized understanding. Nothing further needed at this time.             Time Spent with Patient: 60 (03/18/20 1600)

## 2020-03-20 ENCOUNTER — Ambulatory Visit: Admission: RE | Admit: 2020-03-20 | Payer: PPO | Source: Ambulatory Visit

## 2020-03-20 DIAGNOSIS — C3492 Malignant neoplasm of unspecified part of left bronchus or lung: Secondary | ICD-10-CM | POA: Diagnosis not present

## 2020-03-20 DIAGNOSIS — Z510 Encounter for antineoplastic radiation therapy: Secondary | ICD-10-CM | POA: Diagnosis not present

## 2020-03-20 LAB — URINE CULTURE: Culture: >=100000 — AB

## 2020-03-21 ENCOUNTER — Encounter: Payer: Self-pay | Admitting: *Deleted

## 2020-03-21 ENCOUNTER — Telehealth: Payer: Self-pay | Admitting: *Deleted

## 2020-03-21 ENCOUNTER — Ambulatory Visit
Admission: RE | Admit: 2020-03-21 | Discharge: 2020-03-21 | Disposition: A | Discharge status: Home / Self Care | Payer: PPO | Source: Ambulatory Visit | Attending: Radiation Oncology | Admitting: Radiation Oncology

## 2020-03-21 DIAGNOSIS — Z510 Encounter for antineoplastic radiation therapy: Secondary | ICD-10-CM | POA: Diagnosis not present

## 2020-03-21 DIAGNOSIS — C3492 Malignant neoplasm of unspecified part of left bronchus or lung: Secondary | ICD-10-CM | POA: Diagnosis not present

## 2020-03-21 DIAGNOSIS — R8279 Other abnormal findings on microbiological examination of urine: Secondary | ICD-10-CM

## 2020-03-21 DIAGNOSIS — Z87440 Personal history of urinary (tract) infections: Secondary | ICD-10-CM

## 2020-03-21 NOTE — Telephone Encounter (Signed)
Pt's daughter made aware of urine culture results. Per Dr. Jacinto Reap and Dr. Delaine Lame, no antibiotics needed at this time if pt asymptomatic. Pt states that only has low grade temp but does not report any urinary symptoms. Pt's daughter asked if pt should resume taking microdantin 7m daily since was taking that prior to recent admission. Per Dr. RDelaine Lame will see in clinic to further discuss ongoing management.   Referral placed.

## 2020-03-21 NOTE — Progress Notes (Signed)
  Oncology Nurse Navigator Documentation  Navigator Location: [facility] Onc (03/21/20 1500)   )Navigator Encounter Type: Appt/Treatment Plan Review (03/21/20 1500)                   Treatment Initiated Date: 03/21/20 (03/21/20 1500) Patient Visit Type: RadOnc (03/21/20 1500) Treatment Phase: First Radiation Tx (03/21/20 1500) Barriers/Navigation Needs: No Barriers At This Time (03/21/20 1500)   Interventions: Referrals (03/21/20 1500) Referrals: Other (infectious disease) (03/21/20 1500)       pt started radiation today. Follow up phone call made to pt's daughter after treatment today. No barriers at this time. Radiation going well per pt's daughter. All questions answered during call. Nothing further needed at this time.             Time Spent with Patient: 45 (03/21/20 1500)

## 2020-03-22 ENCOUNTER — Telehealth: Payer: Self-pay | Admitting: Internal Medicine

## 2020-03-22 ENCOUNTER — Ambulatory Visit
Admission: RE | Admit: 2020-03-22 | Discharge: 2020-03-22 | Disposition: A | Discharge status: Home / Self Care | Payer: PPO | Source: Ambulatory Visit | Attending: Radiation Oncology | Admitting: Radiation Oncology

## 2020-03-22 DIAGNOSIS — Z510 Encounter for antineoplastic radiation therapy: Secondary | ICD-10-CM | POA: Diagnosis not present

## 2020-03-22 DIAGNOSIS — C3492 Malignant neoplasm of unspecified part of left bronchus or lung: Secondary | ICD-10-CM | POA: Diagnosis not present

## 2020-03-22 NOTE — Telephone Encounter (Signed)
On 11/19-I called patient's daughter [name] regarding the recent urine culture positive for E. Coli-resistant to multiple oral antibiotics.  UA/UC-done for temperature 100 in the clinic on 11/15.Marland Kitchen  Patient had no UTI symptoms.  Given the absence of any obvious UTI-like symptoms, clinically suspicious for colonization.  Had discussed with Dr. Levester Fresh, ID.  Given patient's complicated history recommend outpatient follow-up with Dr. Steva Ready.  However during my phone call daughter states patient had temperature above 101; feeling poorly having chills.  Worsening back pain.  I raised my concerns for possible actual UTI-given the symptomatology.   I recommended patient's daughter to have the patient go to the emergency room-for further evaluation.  As per the daughter, she will ask her mother to be taken to ER [daughter lives in [place]] if "she is interested".  I did ask her twice that she asked her mother to go to the emergency room.  I will defer to her best judgment.  Hayley- If patient not seen in the ER over the weekend; please have her follow-up in the symptom management clinic on 11/22.   Thanks,GB

## 2020-03-25 ENCOUNTER — Telehealth: Payer: Self-pay | Admitting: Hospice and Palliative Medicine

## 2020-03-25 ENCOUNTER — Ambulatory Visit
Admission: RE | Admit: 2020-03-25 | Discharge: 2020-03-25 | Disposition: A | Discharge status: Home / Self Care | Payer: PPO | Source: Ambulatory Visit | Attending: Radiation Oncology | Admitting: Radiation Oncology

## 2020-03-25 DIAGNOSIS — Z510 Encounter for antineoplastic radiation therapy: Secondary | ICD-10-CM | POA: Diagnosis not present

## 2020-03-25 DIAGNOSIS — C3492 Malignant neoplasm of unspecified part of left bronchus or lung: Secondary | ICD-10-CM | POA: Diagnosis not present

## 2020-03-25 MED ORDER — NALOXONE HCL 4 MG/0.1ML NA LIQD: NASAL | 0 refills | Status: AC

## 2020-03-25 MED ORDER — MORPHINE SULFATE ER 15 MG PO TBCR
15.0000 mg | EXTENDED_RELEASE_TABLET | Freq: Two times a day (BID) | ORAL | 0 refills | Status: DC
Start: 2020-03-25 — End: 2020-04-22

## 2020-03-25 MED ORDER — HYDROCODONE-ACETAMINOPHEN 10-325 MG PO TABS: 1.0000 | ORAL_TABLET | ORAL | 0 refills | Status: DC | PRN

## 2020-03-25 NOTE — Telephone Encounter (Signed)
I spoke with patient's daughter by phone.  Patient has had persistent and severe back pain managed by PCP with Norco.  Reportedly, Norco helps the pain but is short-lived and patient often forgets to take the medication leading to exacerbation of the pain.  Currently, patient is requiring at least 4 doses of Norco daily.  Daughter asked if patient would be appropriate for starting a long-acting opioid, which we discussed in detail.  Will start MS Contin 15 mg every 12 hours.  However, given family plans to travel this week, I recommended that she start that next week when patient can be supervised at home.  In the interim, we will refill Norco and increase frequency to every 4-6 hours as needed.  Plan for follow-up clinic visit in 3 weeks.  Case and plan discussed with Dr. Rogue Bussing.

## 2020-03-25 NOTE — Telephone Encounter (Signed)
Good morning Dr. Jacinto Reap, spoke to Mrs. Fabiano this morning and she is feeling tremendously better.  She did not go to the emergency room because she did not feel "it was necessary".  She had a fever of 100.1 on Friday and Saturday.  She denies a fever on Sunday.  She states she feels tremendously better.  She is eating and drinking well.  She denies any urinary symptoms.  She does think this is related to her booster shot that she had this past Wednesday.  Symptoms are similar to after her second Covid vaccine.  To be on the safe side, she is going to get some labs and a urinalysis drawn tomorrow morning prior to radiation.  Just wanted to give you an update.  Faythe Casa, NP 03/25/2020 10:13 AM

## 2020-03-25 NOTE — Telephone Encounter (Signed)
Thank you, Sonia Baller! GB

## 2020-03-26 ENCOUNTER — Other Ambulatory Visit: Payer: Self-pay | Admitting: *Deleted

## 2020-03-26 ENCOUNTER — Telehealth: Payer: Self-pay | Admitting: Internal Medicine

## 2020-03-26 ENCOUNTER — Ambulatory Visit
Admission: RE | Admit: 2020-03-26 | Discharge: 2020-03-26 | Disposition: A | Discharge status: Home / Self Care | Payer: PPO | Source: Ambulatory Visit | Attending: Radiation Oncology | Admitting: Radiation Oncology

## 2020-03-26 ENCOUNTER — Other Ambulatory Visit: Payer: Self-pay

## 2020-03-26 ENCOUNTER — Inpatient Hospital Stay: Payer: PPO

## 2020-03-26 ENCOUNTER — Ambulatory Visit: Payer: PPO | Admitting: Pharmacist

## 2020-03-26 DIAGNOSIS — I10 Essential (primary) hypertension: Secondary | ICD-10-CM

## 2020-03-26 DIAGNOSIS — C3412 Malignant neoplasm of upper lobe, left bronchus or lung: Secondary | ICD-10-CM

## 2020-03-26 DIAGNOSIS — C3492 Malignant neoplasm of unspecified part of left bronchus or lung: Secondary | ICD-10-CM | POA: Diagnosis not present

## 2020-03-26 DIAGNOSIS — R8279 Other abnormal findings on microbiological examination of urine: Secondary | ICD-10-CM

## 2020-03-26 DIAGNOSIS — Z510 Encounter for antineoplastic radiation therapy: Secondary | ICD-10-CM | POA: Diagnosis not present

## 2020-03-26 DIAGNOSIS — E119 Type 2 diabetes mellitus without complications: Secondary | ICD-10-CM

## 2020-03-26 LAB — CBC WITH DIFFERENTIAL/PLATELET
Abs Immature Granulocytes: 0.07 10*3/uL (ref 0.00–0.07)
Basophils Absolute: 0.1 10*3/uL (ref 0.0–0.1)
Basophils Relative: 1 %
Eosinophils Absolute: 0.5 10*3/uL (ref 0.0–0.5)
Eosinophils Relative: 6 %
HCT: 37.3 % (ref 36.0–46.0)
Hemoglobin: 12 g/dL (ref 12.0–15.0)
Immature Granulocytes: 1 %
Lymphocytes Relative: 29 %
Lymphs Abs: 2.7 10*3/uL (ref 0.7–4.0)
MCH: 27 pg (ref 26.0–34.0)
MCHC: 32.2 g/dL (ref 30.0–36.0)
MCV: 83.8 fL (ref 80.0–100.0)
Monocytes Absolute: 0.8 10*3/uL (ref 0.1–1.0)
Monocytes Relative: 9 %
Neutro Abs: 5 10*3/uL (ref 1.7–7.7)
Neutrophils Relative %: 54 %
Platelets: 262 10*3/uL (ref 150–400)
RBC: 4.45 MIL/uL (ref 3.87–5.11)
RDW: 15.8 % — ABNORMAL HIGH (ref 11.5–15.5)
WBC: 9.1 10*3/uL (ref 4.0–10.5)
nRBC: 0 % (ref 0.0–0.2)

## 2020-03-26 LAB — URINALYSIS, COMPLETE (UACMP) WITH MICROSCOPIC
Bilirubin Urine: NEGATIVE
Glucose, UA: NEGATIVE mg/dL
Hgb urine dipstick: NEGATIVE
Ketones, ur: NEGATIVE mg/dL
Nitrite: NEGATIVE
Protein, ur: NEGATIVE mg/dL
Specific Gravity, Urine: 1.008 (ref 1.005–1.030)
pH: 6 (ref 5.0–8.0)

## 2020-03-26 LAB — BASIC METABOLIC PANEL
Anion gap: 11 (ref 5–15)
BUN: 8 mg/dL (ref 8–23)
CO2: 28 mmol/L (ref 22–32)
Calcium: 8.4 mg/dL — ABNORMAL LOW (ref 8.9–10.3)
Chloride: 96 mmol/L — ABNORMAL LOW (ref 98–111)
Creatinine, Ser: 0.72 mg/dL (ref 0.44–1.00)
GFR, Estimated: >60 mL/min (ref >60)
Glucose, Bld: 188 mg/dL — ABNORMAL HIGH (ref 70–99)
Potassium: 3.3 mmol/L — ABNORMAL LOW (ref 3.5–5.1)
Sodium: 135 mmol/L (ref 135–145)

## 2020-03-26 MED ORDER — SUCRALFATE 1 G PO TABS: 0.5000 g | ORAL_TABLET | Freq: Three times a day (TID) | ORAL | 1 refills | Status: AC

## 2020-03-26 NOTE — Patient Instructions (Signed)
Visit Information  Patient Care Plan: Medication Management    Problem Identified: Diabetes, Cancer     Long-Range Goal: Disease State Management   Start Date: 03/19/2020  Recent Progress: On track  Priority: High  Note:   Current Barriers:  . Unable to achieve control of diabetes d . Recent complex diagnosis  Pharmacist Clinical Goal(s):  Marland Kitchen Over the next 90 days, patient will achieve control of diabetes as evidenced by improvement in A1c.  Interventions: . Inter-disciplinary care team collaboration (see longitudinal plan of care) . Comprehensive medication review performed; medication list updated in electronic medical record  Diabetes: . Uncontrolled; current treatment: Lantus 15 units daily; metformin 750 mg recently discontinued d/t new nausea. Novolog prescribed, but patient has not picked up yet  . Educated on [employer] 14 day placement and use. Patient placed first sensor. Set up with [facility] on her phone, [place] for clinic review set up  Hypertension: . Controlled; current treatment: losartan 50 mg daily, furosemide 40 mg daily  . Recommended to continue current regimen  Hyperlipidemia: . Controlled; current treatment: rosuvastatin 10 mg daily . Recommended to continue current treatment regimen  Lung Cancer, Symptom Management: . Seizure (brain metastasis): levetiracetam 500 mg BID . Nausea: ondansetron 4 mg PRN, procholorperazine PRN. Patient denies need for these recently . Pain: hydrocodone/acetaminophen 10/325 mg Q6H PRN. Patient notes she hasn't taken this yet. Aware that Senokot would be best option for any associated constipation . Mood/sleep: trazodone 50 mg daily  GERD: . Controlled per patient report. Current treatment: omeprazole 40 mg daily . Recommended to continue current regimen  Patient Goals/Self-Care Activities . Over the next 90 days, patient will:  - check blood glucose using CGM at least TID, document, and provide at future  appointments  Follow Up Plan: Telephone follow up appointment with care management team member scheduled for: ~4 weeks      The patient verbalized understanding of instructions, educational materials, and care plan provided today and declined offer to receive copy of patient instructions, educational materials, and care plan.   Plan: Telephone follow up appointment with care management team member scheduled for:~ 4 weeks  Catie Darnelle Maffucci, PharmD, [place], [place] Pharmacist [place] [phone]

## 2020-03-26 NOTE — Chronic Care Management (AMB) (Signed)
Chronic Care Management   Pharmacy Note  03/26/2020 Name: Kimberly Huff MRN: [id number] DOB: 08-31-1945   Subjective:  Kimberly Huff is a 74 y.o. year old female who is a primary care patient of Derrel Nip, Aris Everts, MD. The CCM team was consulted for assistance with chronic disease management and care coordination needs.    Engaged with patient face to face for follow up visit in response to provider referral for pharmacy case management and/or care coordination services. She presented with her daughter, Kimberly Huff.   Consent to Services:  [name] was given information about Chronic Care Management services, agreed to services, and gave verbal consent prior to initiation of services on 03/19/20. Please see initial visit note for detailed documentation.   SDOH (Social Determinants of Health) assessments and interventions performed:  none today  Objective:  Lab Results  Component Value Date   CREATININE 0.72 03/26/2020   CREATININE 0.84 03/18/2020   CREATININE 1.08 (H) 03/04/2020    Lab Results  Component Value Date   HGBA1C 7.3 (H) 02/02/2020       Component Value Date/Time   CHOL 101 01/29/2020 0856   TRIG 201.0 (H) 01/29/2020 0856   HDL 25.20 (L) 01/29/2020 0856   CHOLHDL 4 01/29/2020 0856   VLDL 40.2 (H) 01/29/2020 0856   LDLCALC 38 10/04/2019 0802   LDLDIRECT 58.0 01/29/2020 0856    BP Readings from Last 3 Encounters:  03/18/20 107/76  03/18/20 135/78  03/04/20 119/71    Assessment/Interventions: Review of patient past medical history, allergies, medications, health status, including review of consultants reports, laboratory and other test data, was performed as part of comprehensive evaluation and provision of chronic care management services.   No Known Allergies  Medications Reviewed Today    Reviewed by [name], RPH-CPP (Pharmacist) on 03/19/20 at 1516  Med List Status: <None>  Medication Order Taking? Sig Documenting Provider Last Dose Status  Informant  Continuous Blood Gluc Receiver (FREESTYLE LIBRE 14 DAY READER) [place] [id number] Yes Use to check BS 4 times daily [name], MD Taking Active   Continuous Blood Gluc Sensor (FREESTYLE LIBRE 14 DAY SENSOR) [place] [id number] Yes Use to check BS 4 times daily [name], MD Taking Active   denosumab (PROLIA) 60 MG/ML SOSY injection [id number] Yes Inject into the skin. [provider] Taking Active   furosemide (LASIX) 40 MG tablet [id number] Yes TAKE ONE TABLET EVERY DAY  Patient taking differently: Take 40 mg by mouth daily.    [name], MD Taking Active Self  HYDROcodone-acetaminophen [facility]) 10-325 MG tablet [id number] Yes Take 1 tablet by mouth every 6 (six) hours as needed. [name], MD Taking Active   insulin aspart (NOVOLOG) 100 UNIT/ML injection [id number] No Inject 5 Units into the skin 3 (three) times daily before meals.  Patient not taking: Reported on 03/19/2020   [name], MD Not Taking Active   Insulin Pen Needle 32G X 6 MM MISC [id number] Yes 1 Stick by Does not apply route daily. [name], MD Taking Active   LANTUS SOLOSTAR 100 UNIT/ML Solostar Pen [id number] Yes INJECT 20 UNITS INTO THE SKIN DAILY AS DIRECTED  Patient taking differently: Inject 15 Units into the skin daily.    [name], MD Taking Active   levETIRAcetam (KEPPRA) 500 MG tablet [id number] Yes Take 1 tablet (500 mg total) by mouth 2 (two) times daily. [name], MD Taking Active   losartan (COZAAR) 50 MG tablet [id number]  Yes TAKE ONE TABLET EVERY DAY [name], MD Taking Active Self  omeprazole (PRILOSEC) 40 MG capsule [id number] Yes TAKE 1 CAPSULE BY MOUTH ONCE DAILY [name], MD Taking Active Self  ondansetron (ZOFRAN) 8 MG tablet [id number] No One pill every 8 hours as needed for nausea/vomitting.  Patient not taking: Reported on 03/19/2020   [name], MD Not Taking Active   [facility] ULTRA test strip [id number] Yes USE AS  INSTRUCTED [name], MD Taking Active   prochlorperazine (COMPAZINE) 10 MG tablet [id number] No Take 1 tablet (10 mg total) by mouth every 6 (six) hours as needed for nausea or vomiting.  Patient not taking: Reported on 03/19/2020   [name], MD Not Taking Active   rosuvastatin (CRESTOR) 10 MG tablet [id number] Yes TAKE ONE TABLET EVERY DAY [name], MD Taking Active Self  senna-docusate (SENOKOT-S) 8.6-50 MG tablet [id number] No Take 2 tablets by mouth daily as needed for mild constipation.  Patient not taking: Reported on 03/08/2020   Cleon Gustin, MD Not Taking Active Self  traZODone (DESYREL) 50 MG tablet [id number] Yes Take 1 tablet (50 mg total) by mouth at bedtime as needed for sleep. [name], MD Taking Active   Vitamin D, Ergocalciferol, (DRISDOL) 1.25 MG (50000 UNIT) CAPS capsule [id number] Yes TAKE ONE CAPSULE EACH WEEK [name], MD Taking Active Self          Patient Active Problem List   Diagnosis Date Noted  . Aortic arch atherosclerosis ([place]) 03/19/2020  . Aortic aneurysm ([place]) 03/19/2020  . Primary cancer of left upper lobe of lung ([place]) 02/26/2020  . Aneurysm of cavernous portion of right internal carotid artery 02/03/2020  . Brain mass 02/02/2020  . Brain metastasis ([place]) 02/02/2020  . Obesity (BMI 30.0-34.9)   . Back pain, chronic 01/22/2020  . OAB (overactive bladder) 12/13/2019  . Chronic cystitis 10/08/2019  . Pelvic mass in female 03/28/2019  . Thoracic spondylosis without myelopathy 03/28/2019  . Osteoporosis 11/12/2017  . Urge incontinence of urine 02/25/2016  . Hospital discharge follow-up 10/26/2015  . Benign essential HTN 10/25/2015  . Atrophic kidney 10/25/2015  . History of myocardial infarction 10/25/2015  . Microalbuminuria due to type 2 diabetes mellitus ([place]) 06/29/2015  . Personal history of gastric ulcer 02/23/2013  . Nontraumatic shoulder pain, right 02/23/2013  . Vitamin D deficiency 02/23/2013   . History of right humeral fracture  02/16/2012  . Gouty arthritis 08/25/2011  . Type 2 diabetes mellitus, controlled, with renal complications ([place]) 27/78/2423  . Hemorrhoid 05/27/2011  . Dyslipidemia associated with type 2 diabetes mellitus ([place]) 02/16/2011  . Cataract extraction status 02/16/2011  . Nonfunctioning left kidney 02/16/2011  . Cirrhosis, non-alcoholic ([place]) 53/61/4431    Medication Assistance: None required. Patient affirms current coverage meets needs.   Patient Care Plan: Medication Management    Problem Identified: Diabetes, Cancer     Long-Range Goal: Disease State Management   Start Date: 03/19/2020  Recent Progress: On track  Priority: High  Note:   Current Barriers:  . Unable to achieve control of diabetes d . Recent complex diagnosis  Pharmacist Clinical Goal(s):  Marland Kitchen Over the next 90 days, patient will achieve control of diabetes as evidenced by improvement in A1c.  Interventions: . Inter-disciplinary care team collaboration (see longitudinal plan of care) . Comprehensive medication review performed; medication list updated in electronic medical record  Diabetes: . Uncontrolled; current treatment: Lantus 15 units daily; metformin 750 mg recently discontinued  d/t new nausea. Novolog prescribed, but patient has not picked up yet  . Educated on [employer] 14 day placement and use. Patient placed first sensor. Set up with [facility] on her phone, [place] for clinic review set up  Hypertension: . Controlled; current treatment: losartan 50 mg daily, furosemide 40 mg daily  . Recommended to continue current regimen  Hyperlipidemia: . Controlled; current treatment: rosuvastatin 10 mg daily . Recommended to continue current treatment regimen  Lung Cancer, Symptom Management: . Seizure (brain metastasis): levetiracetam 500 mg BID . Nausea: ondansetron 4 mg PRN, procholorperazine PRN. Patient denies need for these recently . Pain:  hydrocodone/acetaminophen 10/325 mg Q6H PRN. Patient notes she hasn't taken this yet. Aware that Senokot would be best option for any associated constipation . Mood/sleep: trazodone 50 mg daily  GERD: . Controlled per patient report. Current treatment: omeprazole 40 mg daily . Recommended to continue current regimen  Patient Goals/Self-Care Activities . Over the next 90 days, patient will:  - check blood glucose using CGM at least TID, document, and provide at future appointments  Follow Up Plan: Telephone follow up appointment with care management team member scheduled for: ~4 weeks       Plan: Telephone follow up appointment with care management team member scheduled for:~ 4 weeks  Catie Darnelle Maffucci, PharmD, [place], CPP Clinical Pharmacist [place] [phone]

## 2020-03-26 NOTE — Telephone Encounter (Signed)
Pt called wanting to discuss her appt for tomorrow and She wanted to know if it could be moved up

## 2020-03-27 ENCOUNTER — Ambulatory Visit
Admission: RE | Admit: 2020-03-27 | Discharge: 2020-03-27 | Disposition: A | Discharge status: Home / Self Care | Payer: PPO | Source: Ambulatory Visit | Attending: Radiation Oncology | Admitting: Radiation Oncology

## 2020-03-27 ENCOUNTER — Ambulatory Visit: Payer: PPO

## 2020-03-27 DIAGNOSIS — C3492 Malignant neoplasm of unspecified part of left bronchus or lung: Secondary | ICD-10-CM | POA: Diagnosis not present

## 2020-03-27 DIAGNOSIS — Z510 Encounter for antineoplastic radiation therapy: Secondary | ICD-10-CM | POA: Diagnosis not present

## 2020-03-28 LAB — URINE CULTURE: Culture: >=100000 — AB

## 2020-04-01 ENCOUNTER — Ambulatory Visit
Admission: RE | Admit: 2020-04-01 | Discharge: 2020-04-01 | Disposition: A | Discharge status: Home / Self Care | Payer: PPO | Source: Ambulatory Visit | Attending: Radiation Oncology | Admitting: Radiation Oncology

## 2020-04-01 DIAGNOSIS — Z510 Encounter for antineoplastic radiation therapy: Secondary | ICD-10-CM | POA: Diagnosis not present

## 2020-04-01 DIAGNOSIS — C3492 Malignant neoplasm of unspecified part of left bronchus or lung: Secondary | ICD-10-CM | POA: Diagnosis not present

## 2020-04-02 ENCOUNTER — Telehealth: Payer: Self-pay | Admitting: Internal Medicine

## 2020-04-02 ENCOUNTER — Ambulatory Visit
Admission: RE | Admit: 2020-04-02 | Discharge: 2020-04-02 | Disposition: A | Discharge status: Home / Self Care | Payer: PPO | Source: Ambulatory Visit | Attending: Radiation Oncology | Admitting: Radiation Oncology

## 2020-04-02 DIAGNOSIS — Z510 Encounter for antineoplastic radiation therapy: Secondary | ICD-10-CM | POA: Diagnosis not present

## 2020-04-02 DIAGNOSIS — C3492 Malignant neoplasm of unspecified part of left bronchus or lung: Secondary | ICD-10-CM | POA: Diagnosis not present

## 2020-04-02 NOTE — Telephone Encounter (Signed)
Haley-please inform the patient's daughter that patient's urine culture still is positive for multiple drug-resistant bacteria, which I suspect is colonization rather than true infection.  If interested recommend ID evaluation.

## 2020-04-03 ENCOUNTER — Ambulatory Visit
Admission: RE | Admit: 2020-04-03 | Discharge: 2020-04-03 | Disposition: A | Discharge status: Home / Self Care | Payer: PPO | Source: Ambulatory Visit | Attending: Radiation Oncology | Admitting: Radiation Oncology

## 2020-04-03 DIAGNOSIS — Z510 Encounter for antineoplastic radiation therapy: Secondary | ICD-10-CM | POA: Insufficient documentation

## 2020-04-03 DIAGNOSIS — C3412 Malignant neoplasm of upper lobe, left bronchus or lung: Secondary | ICD-10-CM | POA: Insufficient documentation

## 2020-04-03 DIAGNOSIS — C7931 Secondary malignant neoplasm of brain: Secondary | ICD-10-CM | POA: Diagnosis not present

## 2020-04-03 DIAGNOSIS — C3492 Malignant neoplasm of unspecified part of left bronchus or lung: Secondary | ICD-10-CM | POA: Diagnosis not present

## 2020-04-03 NOTE — Telephone Encounter (Signed)
Pt has been made aware. Pt states that she does not have any urinary symptoms at this time. Instructed pt to call if develops any urinary symptoms in the future. Pt verbalized understanding.

## 2020-04-04 ENCOUNTER — Ambulatory Visit
Admission: RE | Admit: 2020-04-04 | Discharge: 2020-04-04 | Disposition: A | Discharge status: Home / Self Care | Payer: PPO | Source: Ambulatory Visit | Attending: Radiation Oncology | Admitting: Radiation Oncology

## 2020-04-04 DIAGNOSIS — Z510 Encounter for antineoplastic radiation therapy: Secondary | ICD-10-CM | POA: Diagnosis not present

## 2020-04-04 DIAGNOSIS — C3492 Malignant neoplasm of unspecified part of left bronchus or lung: Secondary | ICD-10-CM | POA: Diagnosis not present

## 2020-04-05 ENCOUNTER — Ambulatory Visit
Admission: RE | Admit: 2020-04-05 | Discharge: 2020-04-05 | Disposition: A | Discharge status: Home / Self Care | Payer: PPO | Source: Ambulatory Visit | Attending: Radiation Oncology | Admitting: Radiation Oncology

## 2020-04-05 DIAGNOSIS — Z510 Encounter for antineoplastic radiation therapy: Secondary | ICD-10-CM | POA: Diagnosis not present

## 2020-04-05 DIAGNOSIS — C3492 Malignant neoplasm of unspecified part of left bronchus or lung: Secondary | ICD-10-CM | POA: Diagnosis not present

## 2020-04-08 ENCOUNTER — Ambulatory Visit
Admission: RE | Admit: 2020-04-08 | Discharge: 2020-04-08 | Disposition: A | Discharge status: Home / Self Care | Payer: PPO | Source: Ambulatory Visit | Attending: Radiation Oncology | Admitting: Radiation Oncology

## 2020-04-08 DIAGNOSIS — Z510 Encounter for antineoplastic radiation therapy: Secondary | ICD-10-CM | POA: Diagnosis not present

## 2020-04-08 DIAGNOSIS — C3492 Malignant neoplasm of unspecified part of left bronchus or lung: Secondary | ICD-10-CM | POA: Diagnosis not present

## 2020-04-09 ENCOUNTER — Inpatient Hospital Stay: Payer: PPO | Attending: Hospice and Palliative Medicine | Admitting: Hospice and Palliative Medicine

## 2020-04-09 ENCOUNTER — Ambulatory Visit
Admission: RE | Admit: 2020-04-09 | Discharge: 2020-04-09 | Disposition: A | Discharge status: Home / Self Care | Payer: PPO | Source: Ambulatory Visit | Attending: Radiation Oncology | Admitting: Radiation Oncology

## 2020-04-09 ENCOUNTER — Telehealth: Payer: Self-pay

## 2020-04-09 DIAGNOSIS — M1990 Unspecified osteoarthritis, unspecified site: Secondary | ICD-10-CM | POA: Insufficient documentation

## 2020-04-09 DIAGNOSIS — I252 Old myocardial infarction: Secondary | ICD-10-CM | POA: Insufficient documentation

## 2020-04-09 DIAGNOSIS — Z8349 Family history of other endocrine, nutritional and metabolic diseases: Secondary | ICD-10-CM | POA: Diagnosis not present

## 2020-04-09 DIAGNOSIS — C7931 Secondary malignant neoplasm of brain: Secondary | ICD-10-CM | POA: Insufficient documentation

## 2020-04-09 DIAGNOSIS — C3492 Malignant neoplasm of unspecified part of left bronchus or lung: Secondary | ICD-10-CM | POA: Diagnosis not present

## 2020-04-09 DIAGNOSIS — M7989 Other specified soft tissue disorders: Secondary | ICD-10-CM | POA: Diagnosis not present

## 2020-04-09 DIAGNOSIS — Z794 Long term (current) use of insulin: Secondary | ICD-10-CM | POA: Diagnosis not present

## 2020-04-09 DIAGNOSIS — E785 Hyperlipidemia, unspecified: Secondary | ICD-10-CM | POA: Insufficient documentation

## 2020-04-09 DIAGNOSIS — Z801 Family history of malignant neoplasm of trachea, bronchus and lung: Secondary | ICD-10-CM | POA: Insufficient documentation

## 2020-04-09 DIAGNOSIS — Z515 Encounter for palliative care: Secondary | ICD-10-CM | POA: Diagnosis not present

## 2020-04-09 DIAGNOSIS — Z800 Family history of malignant neoplasm of digestive organs: Secondary | ICD-10-CM | POA: Insufficient documentation

## 2020-04-09 DIAGNOSIS — Z905 Acquired absence of kidney: Secondary | ICD-10-CM | POA: Insufficient documentation

## 2020-04-09 DIAGNOSIS — Z79899 Other long term (current) drug therapy: Secondary | ICD-10-CM | POA: Diagnosis not present

## 2020-04-09 DIAGNOSIS — Z803 Family history of malignant neoplasm of breast: Secondary | ICD-10-CM | POA: Insufficient documentation

## 2020-04-09 DIAGNOSIS — Z833 Family history of diabetes mellitus: Secondary | ICD-10-CM | POA: Diagnosis not present

## 2020-04-09 DIAGNOSIS — I10 Essential (primary) hypertension: Secondary | ICD-10-CM | POA: Diagnosis not present

## 2020-04-09 DIAGNOSIS — E119 Type 2 diabetes mellitus without complications: Secondary | ICD-10-CM | POA: Insufficient documentation

## 2020-04-09 DIAGNOSIS — C3412 Malignant neoplasm of upper lobe, left bronchus or lung: Secondary | ICD-10-CM | POA: Insufficient documentation

## 2020-04-09 DIAGNOSIS — Z832 Family history of diseases of the blood and blood-forming organs and certain disorders involving the immune mechanism: Secondary | ICD-10-CM | POA: Insufficient documentation

## 2020-04-09 DIAGNOSIS — Z87442 Personal history of urinary calculi: Secondary | ICD-10-CM | POA: Diagnosis not present

## 2020-04-09 DIAGNOSIS — Q600 Renal agenesis, unilateral: Secondary | ICD-10-CM | POA: Insufficient documentation

## 2020-04-09 DIAGNOSIS — Z8719 Personal history of other diseases of the digestive system: Secondary | ICD-10-CM | POA: Insufficient documentation

## 2020-04-09 DIAGNOSIS — Z90721 Acquired absence of ovaries, unilateral: Secondary | ICD-10-CM | POA: Insufficient documentation

## 2020-04-09 DIAGNOSIS — Z87440 Personal history of urinary (tract) infections: Secondary | ICD-10-CM | POA: Insufficient documentation

## 2020-04-09 DIAGNOSIS — E669 Obesity, unspecified: Secondary | ICD-10-CM | POA: Insufficient documentation

## 2020-04-09 DIAGNOSIS — Z823 Family history of stroke: Secondary | ICD-10-CM | POA: Insufficient documentation

## 2020-04-09 DIAGNOSIS — Z8379 Family history of other diseases of the digestive system: Secondary | ICD-10-CM | POA: Diagnosis not present

## 2020-04-09 DIAGNOSIS — Z87891 Personal history of nicotine dependence: Secondary | ICD-10-CM | POA: Insufficient documentation

## 2020-04-09 DIAGNOSIS — Z510 Encounter for antineoplastic radiation therapy: Secondary | ICD-10-CM | POA: Diagnosis not present

## 2020-04-09 DIAGNOSIS — Z8249 Family history of ischemic heart disease and other diseases of the circulatory system: Secondary | ICD-10-CM | POA: Diagnosis not present

## 2020-04-09 DIAGNOSIS — I2510 Atherosclerotic heart disease of native coronary artery without angina pectoris: Secondary | ICD-10-CM | POA: Diagnosis not present

## 2020-04-09 MED ORDER — LEVETIRACETAM 500 MG PO TABS
500.0000 mg | ORAL_TABLET | Freq: Two times a day (BID) | ORAL | 0 refills | Status: DC
Start: 2020-04-09 — End: 2020-05-06

## 2020-04-09 MED ORDER — HYDROCODONE-ACETAMINOPHEN 10-325 MG PO TABS: 1.0000 | ORAL_TABLET | ORAL | 0 refills | Status: DC | PRN

## 2020-04-09 NOTE — Chronic Care Management (AMB) (Signed)
  Care Management   Note  04/09/2020 Name: TENNIE GRUSSING MRN: [id number] DOB: Jun 20, 1945  [name] is a 74 y.o. year old female who is a primary care patient of Derrel Nip, Aris Everts, MD and is actively engaged with the care management team. I reached out to [name] by phone today to assist with re-scheduling a follow up visit with the Pharmacist  Follow up plan: Unsuccessful telephone outreach attempt made. A HIPAA compliant phone message was left for the patient providing contact information and requesting a return call.  The care management team will reach out to the patient again over the next 2 days.  If patient returns call to provider office, please advise to call [facility]  at [address], [address], [place], [place] [address] Direct Dial: [phone] [email or web address]

## 2020-04-09 NOTE — Progress Notes (Signed)
[facility]  Telephone:([phone] [phone]   Name: Kimberly Huff Date: 04/09/2020 MRN: [id number]  DOB: 08-02-1945  Patient Care Team: [name], MD as PCP - General (Internal Medicine) [name], MD (General Surgery) [name], MD (Internal Medicine) Telford Nab, RN as Oncology Nurse Navigator [name], RPH-CPP (Pharmacist)    REASON FOR CONSULTATION: Kimberly Huff is a 74 y.o. 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  Patient was hospitalized 02/02/2020-02/05/2020 with weakness and slurred speech and was found to have 8 cm left lung mass in 2 brain lesions consistent with metastatic disease.  She has had pain and insomnia.  Palliative care was consulted to help address goals and manage ongoing symptoms.  SOCIAL HISTORY:     reports that she quit smoking about 21 years ago. Her smoking use included cigarettes. She has a 19.00 pack-year smoking history. She has never used smokeless tobacco. She reports previous alcohol use. She reports that she does not use drugs.  Patient is unmarried. She was at home alone. She has a daughter in [place], [place] and a son and [place], [place]. Patient retired from the [employer] and then later worked at [employer].   ADVANCE DIRECTIVES:  On file  CODE STATUS: DNR/DNI (DNR order signed on 03/18/20)  PAST MEDICAL HISTORY: Past Medical History:  Diagnosis Date  . Allergy   . Atrophic kidney    left  . Blood clot in vein 45 yrs ago   inabdoment after surgery  . Blood transfusion without reported diagnosis   . Cataract   . Complication of anesthesia   . Coronary artery disease   . Coronary atherosclerosis   . Diabetes mellitus   . Diverticulosis 2005    Dr Jamal Collin  . Dyspnea    with exertion  . External hemorrhoids without mention of complication 4332  . GERD (gastroesophageal reflux disease)   . H/O cardiac catheterization 2005  . Heart murmur 2012   found by Dr. Jamal Collin  . Hyperlipidemia   . Hypertension   . Kidney stone   . Lesion of bladder   . Myocardial infarction ([place]) 2000  . Neuromuscular disorder ([place])    "achy muscles"  . Obesity   . Osteoarthritis   . PONV (postoperative nausea and vomiting)   . Recurrent UTI   . Seizure ([place])   . Spinal stenosis of cervical region   . Steatohepatitis 05/30/10   Brunt Stage 3, non alcoholic cirrhosis of the liver  . Type II or unspecified type diabetes mellitus without mention of complication, uncontrolled    Patient does takes Metformin and recommended to stop for 48 hours.  Marland Kitchen Ulcer   . Unspecified essential hypertension 2014    PAST SURGICAL HISTORY:  Past Surgical History:  Procedure Laterality Date  . ABDOMINAL HYSTERECTOMY  1982   complete  . BREAST CYST ASPIRATION Right 1990's   benign  . BREAST MASS EXCISION     benign  . CARDIAC CATHETERIZATION  2000   cardiac stent  . cardiac stents  2000  . CATARACT EXTRACTION     both eyes  . COLONOSCOPY  2004, 2015   Dr. Jamal Collin, Dr. Olevia Perches  . CYSTOSCOPY W/ RETROGRADES Left 11/12/2015   Procedure: CYSTOSCOPY WITH RETROGRADE PYELOGRAM;  Surgeon: Candee Furbish  McKenzie, MD;  Location: [facility];  Service: Urology;  Laterality: Left;  . CYSTOSCOPY W/ URETERAL STENT PLACEMENT Left 11/12/2015   Procedure: CYSTOSCOPY WITH STENT REPLACEMENT;  Surgeon: Cleon Gustin, MD;  Location: [facility];  Service: Urology;  Laterality: Left;  . CYSTOSCOPY WITH BIOPSY N/A 09/26/2019   Procedure: CYSTOSCOPY WITH BIOPSY AND FULGURATION;  Surgeon: Cleon Gustin, MD;  Location[facility];  Service: Urology;  Laterality: N/A;  . CYSTOSCOPY WITH URETEROSCOPY AND STENT PLACEMENT Left 10/15/2015   Procedure: CYSTOSCOPY WITH URETEROSCOPY  AND STENT PLACEMENT;  Surgeon: Cleon Gustin, MD;  Location: [facility];  Service: Urology;  Laterality: Left;  . EYE SURGERY Bilateral 2012   cataract  . FOOT SURGERY  2008  . PTCA  2000   stent x 1  . ROBOTIC ASSITED PARTIAL NEPHRECTOMY Left 06/02/2019   Procedure: XI ROBOTIC ASSITED NEPHRECTOMY;  Surgeon: Cleon Gustin, MD;  Location: [facility];  Service: Urology;  Laterality: Left;  . SALPINGOOPHORECTOMY    . TONSILLECTOMY    . UPPER GI ENDOSCOPY    . URETEROSCOPY WITH HOLMIUM LASER LITHOTRIPSY Left 11/12/2015   Procedure: URETEROSCOPY WITH HOLMIUM LASER LITHOTRIPSY;  Surgeon: Cleon Gustin, MD;  Location: [facility];  Service: Urology;  Laterality: Left;  . URETEROTOMY  1960/1969   x 2 during childhood  . VAGINAL HYSTERECTOMY    . VIDEO BRONCHOSCOPY WITH ENDOBRONCHIAL NAVIGATION N/A 02/21/2020   Procedure: VIDEO BRONCHOSCOPY WITH ENDOBRONCHIAL NAVIGATION;  Surgeon: Ottie Glazier, MD;  Location: [facility];  Service: Thoracic;  Laterality: N/A;  . VIDEO BRONCHOSCOPY WITH ENDOBRONCHIAL ULTRASOUND N/A 02/21/2020   Procedure: VIDEO BRONCHOSCOPY WITH ENDOBRONCHIAL ULTRASOUND;  Surgeon: Ottie Glazier, MD;  Location: [facility];  Service: Thoracic;  Laterality: N/A;    HEMATOLOGY/ONCOLOGY HISTORY:  Oncology History Overview Note  # OCT 2021-left upper lobe lung mass invading mediastinum; bronc/Bx-  Oct 20th [Dr.Aleskerov]; non-small cell carcinoma/adenocarcinoma.  Stage IV [brain metastases]; NGS-QNS; PDL-1- 95%.  #2021-PET scan left upper lobe mass; presacral question benign mass; no distant site disease  #Right frontal/temporal brain met x2 [1-1.31m]-seizure- on dex; Keppra; SBRT s/p Oct 13th, 2021  # 11/18-RT to LUL ca [? hemoptysis]  # DM on OHA; HTN; SOLITARY KIDNEY [frequent infections/ atrophic- s/p nephrectomy 2020]  # NGS/MOLECULAR TESTS:NGS-QNS; guardant testing-inconclusive; PDL-1- 95%.     # PALLIATIVE CARE EVALUATION:03/18/2020  # PAIN MANAGEMENT:    DIAGNOSIS: Lung cancer  STAGE:   IV      ;  GOALS: Palliative  CURRENT/MOST RECENT THERAPY : RT    [place]rain metastasis ([facility]  02/02/2020 Initial Diagnosis   [place]   Primary cancer of left upper lobe of lung ([facility]  02/26/2020 Initial Diagnosis   Primary cancer of left upper lobe of lung ([facility])     ALLERGIES:  has No Known Allergies.  MEDICATIONS:  Current Outpatient Medications  Medication Sig Dispense Refill  . Continuous Blood Gluc Receiver (FREESTYLE LIBRE 14 DAY READER) DEVI Use to check BS 4 times daily 2 each 2  . Continuous Blood Gluc Sensor (FREESTYLE LIBRE 14 DAY SENSOR) MISC Use to check BS 4 times daily 2 each 2  . denosumab (PROLIA) 60 MG/ML SOSY injection Inject into the skin.    . furosemide (LASIX) 40 MG tablet TAKE ONE TABLET EVERY DAY (Patient taking differently: Take 40 mg by mouth daily. ) 90 tablet 1  . HYDROcodone-acetaminophen (NORCO) 10-325 MG tablet Take 1 tablet by mouth every 4 (four) hours as needed (breakthrough pain). 6[place]ir  tablet 0  . insulin aspart (NOVOLOG) 100 UNIT/ML injection Inject 5 Units into the skin 3 (three) times daily before meals. (Patient not taking: Reported on 03/19/2020) 10 mL PRN  . Insulin Pen Needle 32G X 6 MM MISC 1 Stick by Does not apply route daily. 100 each 1  . LANTUS SOLOSTAR 100 UNIT/ML Solostar Pen INJECT 20 UNITS INTO THE SKIN DAILY AS DIRECTED (Patient taking differently: Inject 15 Units into the skin daily. ) 15 mL PRN  . levETIRAcetam (KEPPRA) 500 MG tablet Take 1 tablet (500 mg total) by mouth 2 (two) times daily. 60 tablet 0  . losartan (COZAAR) 50 MG tablet TAKE ONE TABLET EVERY DAY 90 tablet 3  . morphine (MS CONTIN) 15 MG 12 hr tablet Take 1 tablet (15 mg total) by mouth every 12 (twelve) hours. 30 tablet 0  . naloxone (NARCAN) nasal spray 4 mg/0.1 mL 1 spray into nostril upon signs of opioid overdose. Call 911. May repeat once if no response within 2-3 minutes. 1 each 0  . omeprazole (PRILOSEC) 40 MG  capsule TAKE 1 CAPSULE BY MOUTH ONCE DAILY 90 capsule 1  . ondansetron (ZOFRAN) 8 MG tablet One pill every 8 hours as needed for nausea/vomitting. (Patient not taking: Reported on 03/19/2020) 40 tablet 1  . ONETOUCH ULTRA test strip USE AS INSTRUCTED 100 each 12  . prochlorperazine (COMPAZINE) 10 MG tablet Take 1 tablet (10 mg total) by mouth every 6 (six) hours as needed for nausea or vomiting. (Patient not taking: Reported on 03/19/2020) 40 tablet 1  . rosuvastatin (CRESTOR) 10 MG tablet TAKE ONE TABLET EVERY DAY 90 tablet 1  . senna-docusate (SENOKOT-S) 8.6-50 MG tablet Take 2 tablets by mouth daily as needed for mild constipation. (Patient not taking: Reported on 03/08/2020) 30 tablet 1  . sucralfate (CARAFATE) 1 g tablet Take 0.5 tablets (0.5 g total) by mouth 3 (three) times daily. Dissolve in 3-4 tbsp warm water, swallow. 60 tablet 1  . traZODone (DESYREL) 50 MG tablet Take 1 tablet (50 mg total) by mouth at bedtime as needed for sleep. 30 tablet 3  . Vitamin D, Ergocalciferol, (DRISDOL) 1.25 MG (50000 UNIT) CAPS capsule TAKE ONE CAPSULE EACH WEEK 4 capsule 11   No current facility-administered medications for this visit.    VITAL SIGNS: There were no vitals taken for this visit. There were no vitals filed for this visit.  Estimated body mass index is 33.48 kg/m as calculated from the following:   Height as of 03/18/20: _0  (1.6 m).   Weight as of 03/18/20: 189 lb (85.7 kg).  LABS: CBC:    Component Value Date/Time   WBC 9.1 03/26/2020 0856   HGB 12.0 03/26/2020 0856   HCT 37.3 03/26/2020 0856   PLT 262 03/26/2020 0856   MCV 83.8 03/26/2020 0856   NEUTROABS 5.0 03/26/2020 0856   LYMPHSABS 2.7 03/26/2020 0856   MONOABS 0.8 03/26/2020 0856   EOSABS 0.5 03/26/2020 0856   BASOSABS 0.1 03/26/2020 0856   Comprehensive Metabolic Panel:    Component Value Date/Time   NA 135 03/26/2020 0856   NA 140 12/15/2012 0000   K 3.3 (L) 03/26/2020 0856   CL 96 (L) 03/26/2020 0856    CO2 28 03/26/2020 0856   BUN 8 03/26/2020 0856   BUN 22 (A) 12/15/2012 0000   CREATININE 0.72 03/26/2020 0856   CREATININE 0.85 12/23/2018 1428   GLUCOSE 188 (H) 03/26/2020 0856   CALCIUM 8.4 (L) 03/26/2020 0856   AST 14 (  L) 03/18/2020 1509   ALT 19 03/18/2020 1509   ALKPHOS 31 (L) 03/18/2020 1509   BILITOT 0.8 03/18/2020 1509   PROT 6.5 03/18/2020 1509   ALBUMIN 3.4 (L) 03/18/2020 1509    RADIOGRAPHIC STUDIES: DG Ankle 2 Views Right  Result Date: 03/13/2020 CLINICAL DATA:  Injury.  Pain and swelling EXAM: RIGHT ANKLE - 2 VIEW COMPARISON:  None. FINDINGS: Bilateral soft tissue swelling. Normal alignment no fracture. No significant arthropathy. Mild spurring on the plantar surface of the calcaneus. IMPRESSION: Diffuse soft tissue swelling.  Negative for fracture. Electronically Signed   By: Franchot Gallo M.D.   On: 03/13/2020 16:53   NM PET Image Initial (PI) Skull Base To Thigh  Result Date: 03/13/2020 CLINICAL DATA:  Initial treatment strategy for left upper lobe lung cancer. EXAM: NUCLEAR MEDICINE PET SKULL BASE TO THIGH TECHNIQUE: 9.4 mCi F-18 FDG was injected intravenously. Full-ring PET imaging was performed from the skull base to thigh after the radiotracer. CT data was obtained and used for attenuation correction and anatomic localization. Fasting blood glucose: 116 mg/dl COMPARISON:  Chest CT 02/19/2020 FINDINGS: Mediastinal blood pool activity: SUV max 2.9 Liver activity: SUV max NA NECK: The patient has known intracranial metastatic lesions are fairly inapparent on today's PET-CT. No hypermetabolic adenopathy in the neck. Incidental CT findings: Bilateral common carotid atherosclerotic calcification. CHEST: The medial left upper lobe mass measures 7.0 by 3.6 cm on image 78 of series 3 and has maximum SUV of 14.2. This mass abuts the mediastinal margin and probably extend slightly into the AP window region of the mediastinum. No overtly pathologic adenopathy is identified. A 0.8 cm  lower right paratracheal lymph node on image 87 of series 3 has a maximum SUV of 2.0 which is near blood pool. Sub solid 0.8 by 0.8 cm right upper lobe pulmonary nodule on image 79 of series 3, maximum SUV 0.9, not significantly hypermetabolic but merit surveillance. There is some probable linear scarring in the right lower lobe adjacent to the major fissure on image 95 of series 3 with slightly nodular component but without hypermetabolic activity. Incidental CT findings: Coronary, aortic arch, and branch vessel atherosclerotic vascular disease. Mild cardiomegaly. Ascending aortic aneurysm 4.1 cm in diameter on image 95 of series 3. ABDOMEN/PELVIS: Physiologic activity in bowel, particularly rectum, without a readily appreciable rectal mass. A presacral lesion with hazy fatty elements measuring 4.6 by 3.3 cm on image 217 of series 3 has a maximum SUV of 2.7. This is probably a myelolipoma with entities such as liposarcoma and extramedullary hematopoiesis considered possible but less likely. Photopenic right mid to lower renal cyst anteriorly. Absent left kidney. Incidental CT findings: Aortoiliac atherosclerotic vascular disease. SKELETON: No significant abnormal hypermetabolic activity in this region. Incidental CT findings: Healed deformity of the right proximal humerus. Lumbar spondylosis and degenerative disc disease. Grade 1 degenerative anterolisthesis at L4-5. IMPRESSION: 1. Hypermetabolic 7.0 cm medial left upper lobe mass, maximum SUV 14.2. This may slightly extend into the AP window region. No definite pathologic adenopathy. 2. Sub solid 0.8 cm right upper lobe nodule is not substantially hypermetabolic but merit surveillance. 3. 4.6 cm in long axis presacral lesion with hazy fatty elements, probably a myelolipoma, less likely be liposarcoma or extramedullary hematopoiesis. In light of the patient's known metastatic lung cancer, I would suggest surveillance of this lesion on follow up imaging to ensure  lack of enlargement/progression, with presumption that this is most likely benign. 4. Known intracranial metastatic lesions are not well seen on today's  PET-CT, but would be better followed by MRI. 5. Ascending thoracic aortic aneurysm 4.1 cm in diameter. This could probably be followed in the context of the patient's follow up cancer imaging. Otherwise, we recommend annual imaging followup by CTA or MRA. This recommendation follows 2010 ACCF/AHA/AATS/ACR/ASA/SCA/SCAI/SIR/STS/SVM Guidelines for the Diagnosis and Management of Patients with Thoracic Aortic Disease. Circulation. 2010; 121: O032-Z224. Aortic aneurysm NOS (ICD10-I71.9) 6. Other imaging findings of potential clinical significance: Aortic Atherosclerosis (ICD10-I70.0). Coronary atherosclerosis. Mild cardiomegaly. Lumbar spondylosis and degenerative disc disease. Electronically Signed   By: Van Clines M.D.   On: 03/13/2020 09:36   DG Foot Complete Right  Result Date: 03/13/2020 CLINICAL DATA:  Right foot injury with pain and swelling EXAM: RIGHT FOOT COMPLETE - 3+ VIEW COMPARISON:  None. FINDINGS: Negative for fracture Degenerative change in the fifth MTP. Moderate hallux valgus with degenerative change in the first MTP. Spurring of the plantar surface the calcaneus. IMPRESSION: Negative for fracture. Electronically Signed   By: Franchot Gallo M.D.   On: 03/13/2020 17:00    PERFORMANCE STATUS (ECOG) : 1 - Symptomatic but completely ambulatory  Review of Systems Unless otherwise noted, a complete review of systems is negative.  Physical Exam General: NAD Pulmonary: Unlabored Extremities: no edema, no joint deformities Skin: no rashes  Neurological: Weakness but otherwise nonfocal  IMPRESSION: Patient was an add-on to my clinic schedule today at request of daughter. Patient and daughter were seen after patient's XRT treatment.  Overall, patient reports she is doing about the same. She was able to travel for Thanksgiving. She  continues to endorse persistent generalized pain. She is taking the Norco 10-358m 4-5 times a day. She finds it effective but says it is short-lived. She has not started the MS Contin due to concerns about the medication name and "morphine." Patient says that her mother was given morphine prior to her death and that she admits the name carry some stigma. We talked about the nature of short acting versus long-acting medications in detail. We talked about option of rotating her to another long-acting medication. Ultimately, patient says that she would be interested and willing to try the MS Contin.  Patient requests refills of the Norco and Keppra.  PLAN: -Continue current scope of treatment -MS Contin 15 mg every 12 hours -Continue Norco as needed for breakthrough pain -Daily bowel regimen -Refill Keppra -RTC as previously scheduled  Patient expressed understanding and was in agreement with this plan. She also understands that She can call the clinic at any time with any questions, concerns, or complaints.     Time Total: 30 minutes  Visit consisted of counseling and education dealing with the complex and emotionally intense issues of symptom management and palliative care in the setting of serious and potentially life-threatening illness.Greater than 50%  of this time was spent counseling and coordinating care related to the above assessment and plan.  Signed by: JAltha Harm PhD, NP-C

## 2020-04-10 ENCOUNTER — Ambulatory Visit
Admission: RE | Admit: 2020-04-10 | Discharge: 2020-04-10 | Disposition: A | Discharge status: Home / Self Care | Payer: PPO | Source: Ambulatory Visit | Attending: Radiation Oncology | Admitting: Radiation Oncology

## 2020-04-10 DIAGNOSIS — C3492 Malignant neoplasm of unspecified part of left bronchus or lung: Secondary | ICD-10-CM | POA: Diagnosis not present

## 2020-04-10 DIAGNOSIS — Z510 Encounter for antineoplastic radiation therapy: Secondary | ICD-10-CM | POA: Diagnosis not present

## 2020-04-11 ENCOUNTER — Ambulatory Visit
Admission: RE | Admit: 2020-04-11 | Discharge: 2020-04-11 | Disposition: A | Discharge status: Home / Self Care | Payer: PPO | Source: Ambulatory Visit | Attending: Radiation Oncology | Admitting: Radiation Oncology

## 2020-04-11 DIAGNOSIS — Z510 Encounter for antineoplastic radiation therapy: Secondary | ICD-10-CM | POA: Diagnosis not present

## 2020-04-11 DIAGNOSIS — C3492 Malignant neoplasm of unspecified part of left bronchus or lung: Secondary | ICD-10-CM | POA: Diagnosis not present

## 2020-04-12 ENCOUNTER — Other Ambulatory Visit: Payer: Self-pay | Admitting: Internal Medicine

## 2020-04-12 ENCOUNTER — Ambulatory Visit
Admission: RE | Admit: 2020-04-12 | Discharge: 2020-04-12 | Disposition: A | Discharge status: Home / Self Care | Payer: PPO | Source: Ambulatory Visit | Attending: Radiation Oncology | Admitting: Radiation Oncology

## 2020-04-12 DIAGNOSIS — Z510 Encounter for antineoplastic radiation therapy: Secondary | ICD-10-CM | POA: Diagnosis not present

## 2020-04-12 DIAGNOSIS — C3492 Malignant neoplasm of unspecified part of left bronchus or lung: Secondary | ICD-10-CM | POA: Diagnosis not present

## 2020-04-15 ENCOUNTER — Ambulatory Visit
Admission: RE | Admit: 2020-04-15 | Discharge: 2020-04-15 | Disposition: A | Discharge status: Home / Self Care | Payer: PPO | Source: Ambulatory Visit | Attending: Radiation Oncology | Admitting: Radiation Oncology

## 2020-04-15 DIAGNOSIS — C3492 Malignant neoplasm of unspecified part of left bronchus or lung: Secondary | ICD-10-CM | POA: Diagnosis not present

## 2020-04-15 DIAGNOSIS — Z510 Encounter for antineoplastic radiation therapy: Secondary | ICD-10-CM | POA: Diagnosis not present

## 2020-04-15 NOTE — Chronic Care Management (AMB) (Signed)
  Care Management   Note  04/15/2020 Name: FRANCILE WOOLFORD MRN: [id number] DOB: 28-Apr-1946  [name] is a 74 y.o. year old female who is a primary care patient of Derrel Nip, Aris Everts, MD and is actively engaged with the care management team. I reached out to [name] by phone today to assist with re-scheduling a follow up visit with the Pharmacist  Follow up plan: Unsuccessful telephone outreach attempt made. A HIPAA compliant phone message was left for the patient providing contact information and requesting a return call.  The care management team will reach out to the patient again over the next 1 days.  If patient returns call to provider office, please advise to call [facility]  at [address], [place], [place], [place] [address] Direct Dial: [phone] [email or web address] Website: [email or web address]

## 2020-04-16 ENCOUNTER — Inpatient Hospital Stay (HOSPITAL_BASED_OUTPATIENT_CLINIC_OR_DEPARTMENT_OTHER): Payer: PPO | Admitting: Hospice and Palliative Medicine

## 2020-04-16 ENCOUNTER — Encounter: Payer: Self-pay | Admitting: Internal Medicine

## 2020-04-16 ENCOUNTER — Other Ambulatory Visit: Payer: Self-pay | Admitting: *Deleted

## 2020-04-16 ENCOUNTER — Inpatient Hospital Stay (HOSPITAL_BASED_OUTPATIENT_CLINIC_OR_DEPARTMENT_OTHER): Payer: PPO | Admitting: Internal Medicine

## 2020-04-16 ENCOUNTER — Ambulatory Visit
Admission: RE | Admit: 2020-04-16 | Discharge: 2020-04-16 | Disposition: A | Discharge status: Home / Self Care | Payer: PPO | Source: Ambulatory Visit | Attending: Radiation Oncology | Admitting: Radiation Oncology

## 2020-04-16 ENCOUNTER — Inpatient Hospital Stay: Payer: PPO

## 2020-04-16 ENCOUNTER — Encounter: Payer: Self-pay | Admitting: *Deleted

## 2020-04-16 VITALS — BP 117/57 | HR 84 | Temp 98.5°F | Resp 16 | Ht 63.0 in | Wt 183.0 lb

## 2020-04-16 DIAGNOSIS — C7931 Secondary malignant neoplasm of brain: Secondary | ICD-10-CM | POA: Diagnosis not present

## 2020-04-16 DIAGNOSIS — C3412 Malignant neoplasm of upper lobe, left bronchus or lung: Secondary | ICD-10-CM

## 2020-04-16 DIAGNOSIS — Z515 Encounter for palliative care: Secondary | ICD-10-CM

## 2020-04-16 DIAGNOSIS — Z510 Encounter for antineoplastic radiation therapy: Secondary | ICD-10-CM | POA: Diagnosis not present

## 2020-04-16 DIAGNOSIS — C3492 Malignant neoplasm of unspecified part of left bronchus or lung: Secondary | ICD-10-CM | POA: Diagnosis not present

## 2020-04-16 DIAGNOSIS — G893 Neoplasm related pain (acute) (chronic): Secondary | ICD-10-CM

## 2020-04-16 LAB — COMPREHENSIVE METABOLIC PANEL
ALT: 9 U/L (ref 0–44)
AST: 14 U/L — ABNORMAL LOW (ref 15–41)
Albumin: 3.2 g/dL — ABNORMAL LOW (ref 3.5–5.0)
Alkaline Phosphatase: 36 U/L — ABNORMAL LOW (ref 38–126)
Anion gap: 12 (ref 5–15)
BUN: 8 mg/dL (ref 8–23)
CO2: 23 mmol/L (ref 22–32)
Calcium: 8.4 mg/dL — ABNORMAL LOW (ref 8.9–10.3)
Chloride: 101 mmol/L (ref 98–111)
Creatinine, Ser: 0.64 mg/dL (ref 0.44–1.00)
GFR, Estimated: >60 mL/min (ref >60)
Glucose, Bld: 139 mg/dL — ABNORMAL HIGH (ref 70–99)
Potassium: 4 mmol/L (ref 3.5–5.1)
Sodium: 136 mmol/L (ref 135–145)
Total Bilirubin: 0.6 mg/dL (ref 0.3–1.2)
Total Protein: 6.5 g/dL (ref 6.5–8.1)

## 2020-04-16 LAB — CBC WITH DIFFERENTIAL/PLATELET
Abs Immature Granulocytes: 0.02 10*3/uL (ref 0.00–0.07)
Basophils Absolute: 0 10*3/uL (ref 0.0–0.1)
Basophils Relative: 1 %
Eosinophils Absolute: 0.3 10*3/uL (ref 0.0–0.5)
Eosinophils Relative: 5 %
HCT: 38 % (ref 36.0–46.0)
Hemoglobin: 11.7 g/dL — ABNORMAL LOW (ref 12.0–15.0)
Immature Granulocytes: 0 %
Lymphocytes Relative: 15 %
Lymphs Abs: 0.9 10*3/uL (ref 0.7–4.0)
MCH: 26.5 pg (ref 26.0–34.0)
MCHC: 30.8 g/dL (ref 30.0–36.0)
MCV: 86 fL (ref 80.0–100.0)
Monocytes Absolute: 0.4 10*3/uL (ref 0.1–1.0)
Monocytes Relative: 6 %
Neutro Abs: 4.6 10*3/uL (ref 1.7–7.7)
Neutrophils Relative %: 73 %
Platelets: 214 10*3/uL (ref 150–400)
RBC: 4.42 MIL/uL (ref 3.87–5.11)
RDW: 15.4 % (ref 11.5–15.5)
WBC: 6.3 10*3/uL (ref 4.0–10.5)
nRBC: 0 % (ref 0.0–0.2)

## 2020-04-16 NOTE — Assessment & Plan Note (Addendum)
#  Non-small cell lung cancer/adenocarcinoma-  left lung mass/invading the mediastinum; Stage IV [brain metastases]; QNS for NGS; GUARDANT-NEGATIVE.  NOV- PET scan- NO DISTANT METASTATIC DISEASE. PD-L1-95%.   # Currently on palliative radiation to left upper lobe mass- on 11/18 [6 weeks; until Jan 11th].  Hold off any additional/concurrent chemotherapy.  We will plan to repeat a CT scan end of January; will order at next visit.  #Seizure secondary to brain metastases/edema x2 [10 to 15 millimeters]; s/p SBRT 10/13; tapered off dexamethasone; plan MRI Brain in mid- Jan 2022; ordered today. [Patient awaiting MR angiography with neurosurgery]   # DM-on insulin.  Better controlled as patient is tapered off steroids.  Stable.  #Back pain likely degenerative as no metastasis noted on recent PET scan; hydrocodone q 12 hour; MS contin 15 mg BID. - STABLE.   # Solitary kidney-normal renal function- STABLE; discussed UTI colonization multidrug-resistant E. Coli.  I discussed with ID; discussed with the patient/daughter regarding evaluation.  Discussed with Hildred Alamin.  Also has appointment with urology in couple months.  #Bilateral leg swelling--secondary steroids; improved.  # DISPOSITION: # follow up in 1 month- MD; labs- cbc/cmp; MRI brain prior-Dr.B

## 2020-04-16 NOTE — Progress Notes (Signed)
  Oncology Nurse Navigator Documentation  Navigator Location: [facility]-Med Onc (04/16/20 1100)   )Navigator Encounter Type: Follow-up Appt (04/16/20 1100)                     Patient Visit Type: MedOnc (04/16/20 1100) Treatment Phase: Active Tx (04/16/20 1100) Barriers/Navigation Needs: Coordination of Care (04/16/20 1100)   Interventions: Coordination of Care (04/16/20 1100)   Coordination of Care: Appts;Radiology (04/16/20 1100)       met with patient and her daughter during follow up visit with Dr. Rogue Bussing today. All questions answered during visit. Informed that will clarify with Dr. Izora Ribas if needs additional MRI brain w/wo or if he scheduled MR angio of the head is sufficient to evaluate brain lesion treatment response. Informed that will give her a call with her follow up appts. Instructed to call with any further questions or needs. Pt verbalized understanding. Nothing further needed at this time.           Time Spent with Patient: 30 (04/16/20 1100)

## 2020-04-16 NOTE — Progress Notes (Signed)
[facility] CONSULT NOTE  Patient Care Team: [name], MD as PCP - General (Internal Medicine) [name], MD (General Surgery) [name], MD (Internal Medicine) Telford Nab, RN as Oncology Nurse Navigator [name], RPH-CPP (Pharmacist)  CHIEF COMPLAINTS/PURPOSE OF CONSULTATION:  Brain metastases  #  Oncology History Overview Note  # OCT 2021-left upper lobe lung mass invading mediastinum; bronc/Bx-  Oct 20th [Dr.Aleskerov]; non-small cell carcinoma/adenocarcinoma.  Stage IV [brain metastases]; NGS-QNS; PDL-1- 95%.  #2021-PET scan left upper lobe mass; presacral question benign mass; no distant site disease  #Right frontal/temporal brain met x2 [1-1.83m]-seizure- on dex; Keppra; SBRT s/p Oct 13th, 2021  # 11/18-RT to LUL ca [? hemoptysis]  # DM on OHA; HTN; SOLITARY KIDNEY [frequent infections/ atrophic- s/p nephrectomy 2020]  # NGS/MOLECULAR TESTS:NGS-QNS; guardant testing-inconclusive; PDL-1- 95%.     # PALLIATIVE CARE EVALUATION:03/18/2020  # PAIN MANAGEMENT:   DIAGNOSIS: Lung cancer  STAGE:   IV      ;  GOALS: Palliative  CURRENT/MOST RECENT THERAPY : RT    Brain metastasis ([facility]  02/02/2020 Initial Diagnosis   Brain metastasis ([facility]   Primary cancer of left upper lobe of lung ([facility]  02/26/2020 Initial Diagnosis   Primary cancer of left upper lobe of lung ([facility])    HISTORY OF PRESENTING ILLNESS:  Kimberly Crazier89y.o.  female  patient with a history of solitary kidney; diabetes obesity; prior history of smoking; in left upper lobe lung mass; brain metastasis status post SBRT is here for follow-up.  Patient is getting palliative radiation to the left upper lobe mass.  She is currently weaned off steroids.  Leg swelling is improved.  Continues to have fatigue.  No nausea no vomiting no headaches.  No new cough.  No fevers or chills.   Review of Systems  Constitutional: Positive for malaise/fatigue. Negative  for chills, diaphoresis, fever and weight loss.  HENT: Negative for nosebleeds and sore throat.   Eyes: Negative for double vision.  Respiratory: Negative for cough, sputum production and wheezing.   Cardiovascular: Positive for leg swelling. Negative for chest pain, palpitations and orthopnea.  Gastrointestinal: Negative for abdominal pain, blood in stool, constipation, diarrhea, heartburn, melena, nausea and vomiting.  Genitourinary: Negative for dysuria, frequency and urgency.  Musculoskeletal: Negative for back pain and joint pain.  Skin: Negative.  Negative for itching and rash.  Neurological: Negative for dizziness, tingling, focal weakness, weakness and headaches.  Endo/Heme/Allergies: Does not bruise/bleed easily.  Psychiatric/Behavioral: Negative for depression. The patient is not nervous/anxious and does not have insomnia.      MEDICAL HISTORY:  Past Medical History:  Diagnosis Date  . Allergy   . Atrophic kidney    left  . Blood clot in vein 45 yrs ago   inabdoment after surgery  . Blood transfusion without reported diagnosis   . Cataract   . Complication of anesthesia   . Coronary artery disease   . Coronary atherosclerosis   . Diabetes mellitus   . Diverticulosis 2005   Dr SJamal Collin . Dyspnea    with exertion  . External hemorrhoids without mention of complication 21478 . GERD (gastroesophageal reflux disease)   . H/O cardiac catheterization 2005  . Heart murmur 2012   found by Dr. SJamal Collin . Hyperlipidemia   . Hypertension   . Kidney stone   . Lesion of bladder   . Myocardial infarction ([facility] 2000  . Neuromuscular disorder ([facility]    "achy  muscles"  . Obesity   . Osteoarthritis   . PONV (postoperative nausea and vomiting)   . Recurrent UTI   . Seizure ([place])   . Spinal stenosis of cervical region   . Steatohepatitis 05/30/10   Brunt Stage 3, non alcoholic cirrhosis of the liver  . Type II or unspecified type diabetes mellitus without mention of complication,  uncontrolled    Patient does takes Metformin and recommended to stop for 48 hours.  Marland Kitchen Ulcer   . Unspecified essential hypertension 2014    SURGICAL HISTORY: Past Surgical History:  Procedure Laterality Date  . ABDOMINAL HYSTERECTOMY  1982   complete  . BREAST CYST ASPIRATION Right 1990's   benign  . BREAST MASS EXCISION     benign  . CARDIAC CATHETERIZATION  2000   cardiac stent  . cardiac stents  2000  . CATARACT EXTRACTION     both eyes  . COLONOSCOPY  2004, 2015   Dr. Jamal Collin, Dr. Olevia Perches  . CYSTOSCOPY W/ RETROGRADES Left 11/12/2015   Procedure: CYSTOSCOPY WITH RETROGRADE PYELOGRAM;  Surgeon: [place] Gustin, MD;  Location: [facility];  Service: Urology;  Laterality: Left;  . CYSTOSCOPY W/ URETERAL STENT PLACEMENT Left 11/12/2015   Procedure: CYSTOSCOPY WITH STENT REPLACEMENT;  Surgeon: Cleon Gustin, MD;  Location: [facility];  Service: Urology;  Laterality: Left;  . CYSTOSCOPY WITH BIOPSY N/A 09/26/2019   Procedure: CYSTOSCOPY WITH BIOPSY AND FULGURATION;  Surgeon: Cleon Gustin, MD;  [facility];  Service: Urology;  Laterality: N/A;  . CYSTOSCOPY WITH URETEROSCOPY AND STENT PLACEMENT Left 10/15/2015   Procedure: CYSTOSCOPY WITH URETEROSCOPY AND STENT PLACEMENT;  Surgeon: Cleon Gustin, MD;  Location: [facility];  Service: Urology;  Laterality: Left;  . EYE SURGERY Bilateral 2012   cataract  . FOOT SURGERY  2008  . PTCA  2000   stent x 1  . ROBOTIC ASSITED PARTIAL NEPHRECTOMY Left 06/02/2019   Procedure: XI ROBOTIC ASSITED NEPHRECTOMY;  Surgeon: Cleon Gustin, MD;  Location: [facility];  Service: Urology;  Laterality: Left;  . SALPINGOOPHORECTOMY    . TONSILLECTOMY    . UPPER GI ENDOSCOPY    . URETEROSCOPY WITH HOLMIUM LASER LITHOTRIPSY Left 11/12/2015   Procedure: URETEROSCOPY WITH HOLMIUM LASER LITHOTRIPSY;  Surgeon: Cleon Gustin, MD;  Location: [facility];  Service: Urology;  Laterality: Left;  . URETEROTOMY  1960/1969   x 2  during childhood  . VAGINAL HYSTERECTOMY    . VIDEO BRONCHOSCOPY WITH ENDOBRONCHIAL NAVIGATION N/A 02/21/2020   Procedure: VIDEO BRONCHOSCOPY WITH ENDOBRONCHIAL NAVIGATION;  Surgeon: Ottie Glazier, MD;  Location: [facility];  Service: Thoracic;  Laterality: N/A;  . VIDEO BRONCHOSCOPY WITH ENDOBRONCHIAL ULTRASOUND N/A 02/21/2020   Procedure: VIDEO BRONCHOSCOPY WITH ENDOBRONCHIAL ULTRASOUND;  Surgeon: Ottie Glazier, MD;  Location: [facility];  Service: Thoracic;  Laterality: N/A;    SOCIAL HISTORY: Social History   Socioeconomic History  . Marital status: Single    Spouse name: Not on file  . Number of children: 2  . Years of education: Not on file  . Highest education level: Not on file  Occupational History  . 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 Smoking status: Former Smoker    Packs/day: 0.50    Years: 38.00    Pack years: 19.00    Types: Cigarettes    Quit date: 09/15/1998    Years since quitting: 21.6  . Smokeless tobacco: Never Used  Vaping Use  .  Vaping Use: Never used  Substance and Sexual Activity  . Alcohol use: Not Currently  . Drug use: No  . Sexual activity: Not Currently  Other Topics Concern  . Not on file  Social History Narrative   Lives in [place]; daughter lives in [place]. Quit smoking may 14th 2000. No alcohol. Retired  From [employer] job.    Social Determinants of Health   Financial Resource Strain: Low Risk   . Difficulty of Paying Living Expenses: Not hard at all  Food Insecurity: No Food Insecurity  . Worried About Charity fundraiser in the Last Year: Never true  . Ran Out of Food in the Last Year: Never true  Transportation Needs: No Transportation Needs  . Lack of Transportation (Medical): No  . Lack of Transportation (Non-Medical): No  Physical Activity: Not on file  Stress: Stress Concern Present  . Feeling of Stress : To some extent  Social Connections: Unknown  . Frequency of  Communication with Friends and Family: More than three times a week  . Frequency of Social Gatherings with Friends and Family: More than three times a week  . Attends Religious Services: More than 4 times per year  . Active Member of Clubs or Organizations: Yes  . Attends Archivist Meetings: More than 4 times per year  . Marital Status: Not on file  Intimate Partner Violence: Not At Risk  . Fear of Current or Ex-Partner: No  . Emotionally Abused: No  . Physically Abused: No  . Sexually Abused: No    FAMILY HISTORY: Family History  Problem Relation Age of Onset  . Lung cancer Father   . Heart disease Father   . Diverticulosis Mother   . Diabetes Mother   . Stroke Mother 58  . Autoimmune disease Daughter   . Thyroid disease Daughter   . Thyroid disease Sister   . Stomach cancer Maternal Aunt   . Breast cancer Maternal Aunt   . Colon cancer Neg Hx   . Esophageal cancer Neg Hx   . Rectal cancer Neg Hx     ALLERGIES:  has No Known Allergies.  MEDICATIONS:  Current Outpatient Medications  Medication Sig Dispense Refill  . Continuous Blood Gluc Receiver (FREESTYLE LIBRE 14 DAY READER) DEVI Use to check BS 4 times daily 2 each 2  . Continuous Blood Gluc Sensor (FREESTYLE LIBRE 14 DAY SENSOR) MISC Use to check BS 4 times daily 2 each 2  . denosumab (PROLIA) 60 MG/ML SOSY injection Inject into the skin.    . furosemide (LASIX) 40 MG tablet TAKE ONE TABLET EVERY DAY 90 tablet 1  . HYDROcodone-acetaminophen (NORCO) 10-325 MG tablet Take 1 tablet by mouth every 4 (four) hours as needed (breakthrough pain). 60 tablet 0  . insulin aspart (NOVOLOG) 100 UNIT/ML injection Inject 5 Units into the skin 3 (three) times daily before meals. 10 mL PRN  . Insulin Pen Needle 32G X 6 MM MISC 1 Stick by Does not apply route daily. 100 each 1  . LANTUS SOLOSTAR 100 UNIT/ML Solostar Pen INJECT 20 UNITS INTO THE SKIN DAILY AS DIRECTED (Patient taking differently: Inject 15 Units into the skin  daily.) 15 mL PRN  . levETIRAcetam (KEPPRA) 500 MG tablet Take 1 tablet (500 mg total) by mouth 2 (two) times daily. 60 tablet 0  . losartan (COZAAR) 50 MG tablet TAKE ONE TABLET EVERY DAY 90 tablet 3  . morphine (MS CONTIN) 15 MG 12 hr tablet Take 1 tablet (15 mg  total) by mouth every 12 (twelve) hours. 30 tablet 0  . naloxone (NARCAN) nasal spray 4 mg/0.1 mL 1 spray into nostril upon signs of opioid overdose. Call 911. May repeat once if no response within 2-3 minutes. 1 each 0  . omeprazole (PRILOSEC) 40 MG capsule TAKE 1 CAPSULE BY MOUTH ONCE DAILY 90 capsule 1  . ondansetron (ZOFRAN) 8 MG tablet One pill every 8 hours as needed for nausea/vomitting. 40 tablet 1  . ONETOUCH ULTRA test strip USE AS INSTRUCTED 100 each 12  . prochlorperazine (COMPAZINE) 10 MG tablet Take 1 tablet (10 mg total) by mouth every 6 (six) hours as needed for nausea or vomiting. 40 tablet 1  . rosuvastatin (CRESTOR) 10 MG tablet TAKE ONE TABLET EVERY DAY 90 tablet 1  . senna-docusate (SENOKOT-S) 8.6-50 MG tablet Take 2 tablets by mouth daily as needed for mild constipation. 30 tablet 1  . sucralfate (CARAFATE) 1 g tablet Take 0.5 tablets (0.5 g total) by mouth 3 (three) times daily. Dissolve in 3-4 tbsp warm water, swallow. 60 tablet 1  . traZODone (DESYREL) 50 MG tablet Take 1 tablet (50 mg total) by mouth at bedtime as needed for sleep. 30 tablet 3  . Vitamin D, Ergocalciferol, (DRISDOL) 1.25 MG (50000 UNIT) CAPS capsule TAKE ONE CAPSULE EACH WEEK 4 capsule 11   No current facility-administered medications for this visit.      Marland Kitchen  PHYSICAL EXAMINATION: ECOG PERFORMANCE STATUS: 1 - Symptomatic but completely ambulatory  Vitals:   04/16/20 0958  BP: (!) 117/57  Pulse: 84  Resp: 16  Temp: 98.5 F (36.9 C)  SpO2: 96%   Filed Weights   04/16/20 0958  Weight: 183 lb (83 kg)    Physical Exam Vitals and nursing note reviewed.  Constitutional:      Comments: Patient is alone.  She is walking  independently.  HENT:     Head: Normocephalic and atraumatic.     Mouth/Throat:     Pharynx: No oropharyngeal exudate.  Eyes:     Pupils: Pupils are equal, round, and reactive to light.  Cardiovascular:     Rate and Rhythm: Normal rate and regular rhythm.  Pulmonary:     Effort: No respiratory distress.     Breath sounds: No wheezing.  Abdominal:     General: Bowel sounds are normal. There is no distension.     Palpations: Abdomen is soft. There is no mass.     Tenderness: There is no abdominal tenderness. There is no guarding or rebound.  Musculoskeletal:        General: No tenderness. Normal range of motion.     Cervical back: Normal range of motion and neck supple.  Skin:    General: Skin is warm.  Neurological:     Mental Status: She is alert and oriented to person, place, and time.  Psychiatric:        Mood and Affect: Affect normal.      LABORATORY DATA:  I have reviewed the data as listed Lab Results  Component Value Date   WBC 6.3 04/16/2020   HGB 11.7 (L) 04/16/2020   HCT 38.0 04/16/2020   MCV 86.0 04/16/2020   PLT 214 04/16/2020   Recent Labs    06/03/19 0434 06/29/19 0819 02/02/20 0925 02/03/20 0541 02/09/20 1002 03/04/20 1113 03/18/20 1509 03/26/20 0856 04/16/20 0938  NA 136   < > 137 133*   < > 134* 136 135 136  K 4.2   < > 4.2  4.3   < > 4.0 3.9 3.3* 4.0  CL 104   < > 100 99   < > 98 100 96* 101  CO2 26   < > 27 24   < > 26 28 28 23   GLUCOSE 125*   < > 177* 248*   < > 118* 175* 188* 139*  BUN 11   < > 16 18   < > 32* 12 8 8   CREATININE 0.62   < > 0.81 0.78   < > 1.08* 0.84 0.72 0.64  CALCIUM 9.0   < > 9.6 9.2   < > 9.3 9.1 8.4* 8.4*  GFRNONAA >60  --  >60 >60   < > 53* >60 >60 >60  GFRAA >60  --  >60 >60  --   --   --   --   --   PROT  --    < > 7.7  --    < > 6.7 6.5  --  6.5  ALBUMIN  --    < > 4.5  --    < > 4.0 3.4*  --  3.2*  AST  --    < > 29  --    < > 26 14*  --  14*  ALT  --    < > 31  --    < > 48* 19  --  9  ALKPHOS  --    < > 43   --    < > 33* 31*  --  36*  BILITOT  --    < > 0.9  --    < > 1.1 0.8  --  0.6   < > = values in this interval not displayed.    RADIOGRAPHIC STUDIES: I have personally reviewed the radiological images as listed and agreed with the findings in the report. No results found.  ASSESSMENT & PLAN:   Primary cancer of left upper lobe of lung ([place]) #Non-small cell lung cancer/adenocarcinoma-  left lung mass/invading the mediastinum; Stage IV [brain metastases]; QNS for NGS; GUARDANT-NEGATIVE.  NOV- PET scan- NO DISTANT METASTATIC DISEASE. PD-L1-95%.   # Currently on palliative radiation to left upper lobe mass- on 11/18 [6 weeks; until Jan 11th].  Hold off any additional/concurrent chemotherapy.  We will plan to repeat a CT scan end of January; will order at next visit.  #Seizure secondary to brain metastases/edema x2 [10 to 15 millimeters]; s/p SBRT 10/13; tapered off dexamethasone; plan MRI Brain in mid- Jan 2022; ordered today. [Patient awaiting MR angiography with neurosurgery]   # DM-on insulin.  Better controlled as patient is tapered off steroids.  Stable.  #Back pain likely degenerative as no metastasis noted on recent PET scan; hydrocodone q 12 hour; MS contin 15 mg BID. - STABLE.   # Solitary kidney-normal renal function- STABLE; discussed UTI colonization multidrug-resistant E. Coli.  I discussed with ID; discussed with the patient/daughter regarding evaluation.  Discussed with Hildred Alamin.  Also has appointment with urology in couple months.  #Bilateral leg swelling--secondary steroids; improved.  # DISPOSITION: # follow up in 1 month- MD; labs- cbc/cmp; MRI brain prior-Dr.B  All questions were answered. The patient knows to call the clinic with any problems, questions or concerns.    [name], MD 04/17/2020 7:39 AM

## 2020-04-16 NOTE — Progress Notes (Signed)
[facility]  [phone] [phone]   Name: Kimberly Huff Date: 04/16/2020 MRN: [id number]  DOB: [birth year]  Patient Care Team: [name], MD as PCP - General (Internal Medicine) [name], MD (General Surgery) [name], MD (Internal Medicine) Telford Nab, RN as Oncology Nurse Navigator [name], RPH-CPP (Pharmacist)    REASON FOR CONSULTATION: Kimberly Huff is a 74 y.o. 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  Patient was hospitalized 02/02/2020-02/05/2020 with weakness and slurred speech and was found to have 8 cm left lung mass in 2 brain lesions consistent with metastatic disease.  She has had pain and insomnia.  Palliative care was consulted to help address goals and manage ongoing symptoms.  SOCIAL HISTORY:     reports that she quit smoking about 21 years ago. Her smoking use included cigarettes. She has a 19.00 pack-year smoking history. She has never used smokeless tobacco. She reports previous alcohol use. She reports that she does not use drugs.  Patient is unmarried. She was at home alone. She has a daughter in [place], [place] and a son and [place], [place]. Patient retired from the [employer] and then later worked at [employer].   ADVANCE DIRECTIVES:  On file  CODE STATUS: DNR/DNI (DNR order signed on 03/18/20)  PAST MEDICAL HISTORY: Past Medical History:  Diagnosis Date  . Allergy   . Atrophic kidney    left  . Blood clot in vein 45 yrs ago   inabdoment after surgery  . Blood transfusion without reported diagnosis   . Cataract   . Complication of anesthesia   . Coronary artery disease   . Coronary atherosclerosis   . Diabetes mellitus   . Diverticulosis 2005    Dr Jamal Collin  . Dyspnea    with exertion  . External hemorrhoids without mention of complication 9924  . GERD (gastroesophageal reflux disease)   . H/O cardiac catheterization 2005  . Heart murmur 2012   found by Dr. Jamal Collin  . Hyperlipidemia   . Hypertension   . Kidney stone   . Lesion of bladder   . Myocardial infarction ([facility]) 2000  . Neuromuscular disorder ([facility] [facility])    "achy muscles"  . Obesity   . Osteoarthritis   . PONV (postoperative nausea and vomiting)   . Recurrent UTI   . Seizure ([facility])   . Spinal stenosis of cervical region   . Steatohepatitis 05/30/10   Brunt Stage 3, non alcoholic cirrhosis of the liver  . Type II or unspecified type diabetes mellitus without mention of complication, uncontrolled    Patient does takes Metformin and recommended to stop for 48 hours.  Marland Kitchen Ulcer   . Unspecified essential hypertension 2014    PAST SURGICAL HISTORY:  Past Surgical History:  Procedure Laterality Date  . ABDOMINAL HYSTERECTOMY  1982   complete  . BREAST CYST ASPIRATION Right 1990's   benign  . BREAST MASS EXCISION     benign  . CARDIAC CATHETERIZATION  2000   cardiac stent  . cardiac stents  2000  . CATARACT EXTRACTION     both eyes  . COLONOSCOPY  2004, 2015   Dr. Jamal Collin, Dr. Olevia Perches  . CYSTOSCOPY W/ RETROGRADES Left 11/12/2015   Procedure: CYSTOSCOPY WITH RETROGRADE PYELOGRAM;  Surgeon: Candee Furbish  McKenzie, MD;  Location: [facility];  Service: Urology;  Laterality: Left;  . CYSTOSCOPY W/ URETERAL STENT PLACEMENT Left 11/12/2015   Procedure: CYSTOSCOPY WITH STENT REPLACEMENT;  Surgeon: Cleon Gustin, MD;  Location: [facility];  Service: Urology;  Laterality: Left;  . CYSTOSCOPY WITH BIOPSY N/A 09/26/2019   Procedure: CYSTOSCOPY WITH BIOPSY AND FULGURATION;  Surgeon: Cleon Gustin, MD;  Location: [facility];  Service: Urology;  Laterality: N/A;  . CYSTOSCOPY WITH URETEROSCOPY AND STENT PLACEMENT Left 10/15/2015   Procedure: CYSTOSCOPY WITH URETEROSCOPY  AND STENT PLACEMENT;  Surgeon: Cleon Gustin, MD;  Location: [facility];  Service: Urology;  Laterality: Left;  . EYE SURGERY Bilateral 2012   cataract  . FOOT SURGERY  2008  . PTCA  2000   stent x 1  . ROBOTIC ASSITED PARTIAL NEPHRECTOMY Left 06/02/2019   Procedure: XI ROBOTIC ASSITED NEPHRECTOMY;  Surgeon: Cleon Gustin, MD;  Location: [facility];  Service: Urology;  Laterality: Left;  . SALPINGOOPHORECTOMY    . TONSILLECTOMY    . UPPER GI ENDOSCOPY    . URETEROSCOPY WITH HOLMIUM LASER LITHOTRIPSY Left 11/12/2015   Procedure: URETEROSCOPY WITH HOLMIUM LASER LITHOTRIPSY;  Surgeon: Cleon Gustin, MD;  Location: [facility];  Service: Urology;  Laterality: Left;  . URETEROTOMY  1960/1969   x 2 during childhood  . VAGINAL HYSTERECTOMY    . VIDEO BRONCHOSCOPY WITH ENDOBRONCHIAL NAVIGATION N/A 02/21/2020   Procedure: VIDEO BRONCHOSCOPY WITH ENDOBRONCHIAL NAVIGATION;  Surgeon: Ottie Glazier, MD;  Location: [facility];  Service: Thoracic;  Laterality: N/A;  . VIDEO BRONCHOSCOPY WITH ENDOBRONCHIAL ULTRASOUND N/A 02/21/2020   Procedure: VIDEO BRONCHOSCOPY WITH ENDOBRONCHIAL ULTRASOUND;  Surgeon: Ottie Glazier, MD;  Location: [facility];  Service: Thoracic;  Laterality: N/A;    HEMATOLOGY/ONCOLOGY HISTORY:  Oncology History Overview Note  # OCT 2021-left upper lobe lung mass invading mediastinum; bronc/Bx-  Oct 20th [Dr.Aleskerov]; non-small cell carcinoma/adenocarcinoma.  Stage IV [brain metastases]; NGS-QNS; PDL-1- 95%.  #2021-PET scan left upper lobe mass; presacral question benign mass; no distant site disease  #Right frontal/temporal brain met x2 [1-1.37m]-seizure- on dex; Keppra; SBRT s/p Oct 13th, 2021  # 11/18-RT to LUL ca [? hemoptysis]  # DM on OHA; HTN; SOLITARY KIDNEY [frequent infections/ atrophic- s/p nephrectomy 2020]  # NGS/MOLECULAR TESTS:NGS-QNS; guardant testing-inconclusive; PDL-1- 95%.     # PALLIATIVE CARE EVALUATION:03/18/2020  # PAIN MANAGEMENT:    DIAGNOSIS: Lung cancer  STAGE:   IV      ;  GOALS: Palliative  CURRENT/MOST RECENT THERAPY : RT    Brain metastasis ([facility]  02/02/2020 Initial Diagnosis   Brain metastasis ([facility]   Primary cancer of left upper lobe of lung ([facility]  02/26/2020 Initial Diagnosis   Primary cancer of left upper lobe of lung ([facility])     ALLERGIES:  has No Known Allergies.  MEDICATIONS:  Current Outpatient Medications  Medication Sig Dispense Refill  . Continuous Blood Gluc Receiver (FREESTYLE LIBRE 14 DAY READER) DEVI Use to check BS 4 times daily 2 each 2  . Continuous Blood Gluc Sensor (FREESTYLE LIBRE 14 DAY SENSOR) MISC Use to check BS 4 times daily 2 each 2  . denosumab (PROLIA) 60 MG/ML SOSY injection Inject into the skin.    . furosemide (LASIX) 40 MG tablet TAKE ONE TABLET EVERY DAY 90 tablet 1  . HYDROcodone-acetaminophen (NORCO) 10-325 MG tablet Take 1 tablet by mouth every 4 (four) hours as needed (breakthrough pain). 60 tablet 0  . insulin aspart (NOVOLOG) 100 UNIT/ML injection  Inject 5 Units into the skin 3 (three) times daily before meals. 10 mL PRN  . Insulin Pen Needle 32G X 6 MM MISC 1 Stick by Does not apply route daily. 100 each 1  . LANTUS SOLOSTAR 100 UNIT/ML Solostar Pen INJECT 20 UNITS INTO THE SKIN DAILY AS DIRECTED (Patient taking differently: Inject 15 Units into the skin daily.) 15 mL PRN  . levETIRAcetam (KEPPRA) 500 MG tablet Take 1 tablet (500 mg total) by mouth 2 (two) times daily. 60 tablet 0  . losartan (COZAAR) 50 MG tablet TAKE ONE TABLET EVERY DAY 90 tablet 3  . morphine (MS CONTIN) 15 MG 12 hr tablet Take 1 tablet (15 mg total) by mouth every 12 (twelve) hours. 30 tablet 0  . naloxone (NARCAN) nasal spray 4 mg/0.1 mL 1 spray into nostril upon signs of opioid overdose. Call 911. May repeat once if no response within 2-3 minutes. 1 each 0  . omeprazole (PRILOSEC) 40 MG capsule TAKE 1 CAPSULE BY MOUTH ONCE DAILY 90 capsule 1  . ondansetron (ZOFRAN) 8 MG tablet One pill  every 8 hours as needed for nausea/vomitting. 40 tablet 1  . ONETOUCH ULTRA test strip USE AS INSTRUCTED 100 each 12  . prochlorperazine (COMPAZINE) 10 MG tablet Take 1 tablet (10 mg total) by mouth every 6 (six) hours as needed for nausea or vomiting. 40 tablet 1  . rosuvastatin (CRESTOR) 10 MG tablet TAKE ONE TABLET EVERY DAY 90 tablet 1  . senna-docusate (SENOKOT-S) 8.6-50 MG tablet Take 2 tablets by mouth daily as needed for mild constipation. 30 tablet 1  . sucralfate (CARAFATE) 1 g tablet Take 0.5 tablets (0.5 g total) by mouth 3 (three) times daily. Dissolve in 3-4 tbsp warm water, swallow. 60 tablet 1  . traZODone (DESYREL) 50 MG tablet Take 1 tablet (50 mg total) by mouth at bedtime as needed for sleep. 30 tablet 3  . Vitamin D, Ergocalciferol, (DRISDOL) 1.25 MG (50000 UNIT) CAPS capsule TAKE ONE CAPSULE EACH WEEK 4 capsule 11   No current facility-administered medications for this visit.    VITAL SIGNS: There were no vitals taken for this visit. There were no vitals filed for this visit.  Estimated body mass index is 32.42 kg/m as calculated from the following:   Height as of an earlier encounter on 04/16/20: 5' 3" (1.6 m).   Weight as of an earlier encounter on 04/16/20: 183 lb (83 kg).  LABS: CBC:    Component Value Date/Time   WBC 6.3 04/16/2020 0938   HGB 11.7 (L) 04/16/2020 0938   HCT 38.0 04/16/2020 0938   PLT 214 04/16/2020 0938   MCV 86.0 04/16/2020 0938   NEUTROABS 4.6 04/16/2020 0938   LYMPHSABS 0.9 04/16/2020 0938   MONOABS 0.4 04/16/2020 0938   EOSABS 0.3 04/16/2020 0938   BASOSABS 0.0 04/16/2020 0938   Comprehensive Metabolic Panel:    Component Value Date/Time   NA 136 04/16/2020 0938   NA 140 12/15/2012 0000   K 4.0 04/16/2020 0938   CL 101 04/16/2020 0938   CO2 23 04/16/2020 0938   BUN 8 04/16/2020 0938   BUN 22 (A) 12/15/2012 0000   CREATININE 0.64 04/16/2020 0938   CREATININE 0.85 12/23/2018 1428   GLUCOSE 139 (H) 04/16/2020 0938   CALCIUM  8.4 (L) 04/16/2020 0938   AST 14 (L) 04/16/2020 0938   ALT 9 04/16/2020 0938   ALKPHOS 36 (L) 04/16/2020 0938   BILITOT 0.6 04/16/2020 0938   PROT 6.5 04/16/2020 0938     ALBUMIN 3.2 (L) 04/16/2020 0454    RADIOGRAPHIC STUDIES: No results found.  PERFORMANCE STATUS (ECOG) : 1 - Symptomatic but completely ambulatory  Review of Systems Unless otherwise noted, a complete review of systems is negative.  Physical Exam General: NAD Pulmonary: Unlabored Extremities: no edema, no joint deformities Skin: no rashes  Neurological: Weakness but otherwise nonfocal  IMPRESSION: Routine follow-up visit.  Patient reports that she started MS Contin and seems to be tolerating well.  She reports that it is helping her pain overall.  She finds she is better able to engage in activities she finds enjoyable.  She continues to require Norco about 2 times each day for breakthrough pain.  She does endorse worse constipation.  We discussed adjustment to her bowel regimen in detail today.  She has taken senna and found it to be effective.  Otherwise, patient denies any significant changes or concerns.  PLAN: -Continue current scope of treatment -Continue MS Contin 15 mg every 12 hours -Continue Norco as needed for breakthrough pain -Daily bowel regimen -RTC 1 month  Case and plan discussed with Dr. Rogue Bussing  Patient expressed understanding and was in agreement with this plan. She also understands that She can call the clinic at any time with any questions, concerns, or complaints.     Time Total: 15 minutes  Visit consisted of counseling and education dealing with the complex and emotionally intense issues of symptom management and palliative care in the setting of serious and potentially life-threatening illness.Greater than 50%  of this time was spent counseling and coordinating care related to the above assessment and plan.  Signed by: Altha Harm, PhD, NP-C

## 2020-04-17 ENCOUNTER — Ambulatory Visit
Admission: RE | Admit: 2020-04-17 | Discharge: 2020-04-17 | Disposition: A | Discharge status: Home / Self Care | Payer: PPO | Source: Ambulatory Visit | Attending: Radiation Oncology | Admitting: Radiation Oncology

## 2020-04-17 DIAGNOSIS — Z510 Encounter for antineoplastic radiation therapy: Secondary | ICD-10-CM | POA: Diagnosis not present

## 2020-04-17 DIAGNOSIS — C3492 Malignant neoplasm of unspecified part of left bronchus or lung: Secondary | ICD-10-CM | POA: Diagnosis not present

## 2020-04-17 NOTE — Chronic Care Management (AMB) (Signed)
  Care Management   Note  04/17/2020 Name: Kimberly Huff MRN: [id number] DOB: [birth year]  Kimberly Huff is a 74 y.o. year old female who is a primary care patient of Derrel Nip, Aris Everts, MD and is actively engaged with the care management team. I reached out to Kimberly Huff by phone today to assist with re-scheduling a follow up visit with the Pharmacist  Follow up plan: Unsuccessful telephone outreach attempt made. A HIPAA compliant phone message was left for the patient providing contact information and requesting a return call.  The care management team will reach out to the patient again over the next 1 days.  If patient returns call to provider office, please advise to call [facility]  at [place], [place], [place], [place] [address] Direct Dial: [phone] [email or web address]: [email or web address]

## 2020-04-18 ENCOUNTER — Ambulatory Visit: Payer: PPO | Admitting: Pharmacist

## 2020-04-18 ENCOUNTER — Ambulatory Visit
Admission: RE | Admit: 2020-04-18 | Discharge: 2020-04-18 | Disposition: A | Discharge status: Home / Self Care | Payer: PPO | Source: Ambulatory Visit | Attending: Radiation Oncology | Admitting: Radiation Oncology

## 2020-04-18 DIAGNOSIS — I10 Essential (primary) hypertension: Secondary | ICD-10-CM

## 2020-04-18 DIAGNOSIS — Z510 Encounter for antineoplastic radiation therapy: Secondary | ICD-10-CM | POA: Diagnosis not present

## 2020-04-18 DIAGNOSIS — C3492 Malignant neoplasm of unspecified part of left bronchus or lung: Secondary | ICD-10-CM | POA: Diagnosis not present

## 2020-04-18 DIAGNOSIS — E1169 Type 2 diabetes mellitus with other specified complication: Secondary | ICD-10-CM

## 2020-04-18 DIAGNOSIS — E119 Type 2 diabetes mellitus without complications: Secondary | ICD-10-CM

## 2020-04-18 NOTE — Patient Instructions (Signed)
Visit Information  Patient Care Plan: Medication Management    Problem Identified: Diabetes, Cancer     Long-Range Goal: Disease State Management   Start Date: 03/19/2020  This Visit's Progress: On track  Recent Progress: On track  Priority: High  Note:   Current Barriers:  . Unable to achieve control of diabetes  . Recent complex diagnosis  Pharmacist Clinical Goal(s):  Marland Kitchen Over the next 90 days, patient will achieve control of diabetes as evidenced by improvement in A1c.  Interventions: . Inter-disciplinary care team collaboration (see longitudinal plan of care) . Comprehensive medication review performed; medication list updated in electronic medical record  Diabetes: . Uncontrolled; current treatment: Lantus 15 units daily; Novolog prescribed, but patient has not picked up yet . Current glucose readings:  Date of Download: 12/3-12/16/12 % Time CGM is active: 15% Average Glucose: 155 mg/dL Time in Goal:  - Time in range 70-180: 83% - Time above range: 17% - Time below range: 0% Observed patterns: too little data to see patterns . Reviewed that we are only capturing a small percentage of her glucose data. Encouraged to try to scan sugar at least Q8hours while awake. The few mealtime elevations that I do see are not significant enough to require Novolog at this point, given variable appetite with radiation.  . Continue current regimen of Lantus 15 units daily at this time.   Hypertension: . Controlled; current treatment: losartan 50 mg daily, furosemide 40 mg PRN - usually every other day . Recommended to continue current regimen  Hyperlipidemia: . Controlled; current treatment: rosuvastatin 10 mg daily . Recommended to continue current treatment regimen  Lung Cancer, Symptom Management: . Seizure (brain metastasis): levetiracetam 500 mg BID . Nausea: ondansetron 4 mg PRN, procholorperazine PRN. Notes one day that she had nausea that she took ondansetron . Pain:  morphine 15 mg BID, hydrocodone/acetaminophen 10/325 mg Q6H PRN. Notes that she usually takes acetaminophen 500 mg + hydrocodone/acetaminophen 10/325 mg QAM when she first wakes up; she notes the fever sensation has been the worst symptom. Typically not needing a second dose of hydrocodone/acetaminophen during the day . Constipation: senna/docusate PRN. Notes she is needing every few days . Mood/sleep: trazodone 50 mg daily- not taking since starting on morphine  GERD: . Controlled per patient report. Current treatment: omeprazole 40 mg daily, sucralfate 0.5 g PRN acid related swallowing issues. Patient notes very infrequent need for sucralfate . Recommended to continue current regimen  Patient Goals/Self-Care Activities . Over the next 90 days, patient will:  - check blood glucose using CGM at least TID, document, and provide at future appointments  Follow Up Plan: Telephone follow up appointment with care management team member scheduled for: ~5 weeks       The patient verbalized understanding of instructions, educational materials, and care plan provided today and declined offer to receive copy of patient instructions, educational materials, and care plan.   Plan: Telephone follow up appointment with care management team member scheduled for: ~ 6 weeks  Catie Darnelle Maffucci, PharmD, [place], [place] Clinical Pharmacist [employer] at [employer] [phone]

## 2020-04-18 NOTE — Chronic Care Management (AMB) (Signed)
Chronic Care Management   Pharmacy Note  04/18/2020 Name: Kimberly Huff MRN: [id number] DOB: 1945/05/06  Subjective:  Kimberly Huff is a 74 y.o. year old female who is a primary care patient of Derrel Nip, Aris Everts, MD. The CCM team was consulted for assistance with chronic disease management and care coordination needs.    Engaged with patient by telephone for follow up visit in response to provider referral for pharmacy case management and/or care coordination services.   Consent to Services:  Ms. [name] was given information about Chronic Care Management services, agreed to services, and gave verbal consent prior to initiation of services on 03/19/20. Please see initial visit note for detailed documentation.   Objective:  Lab Results  Component Value Date   CREATININE 0.64 04/16/2020   CREATININE 0.72 03/26/2020   CREATININE 0.84 03/18/2020    Lab Results  Component Value Date   HGBA1C 7.3 (H) 02/02/2020       Component Value Date/Time   CHOL 101 01/29/2020 0856   TRIG 201.0 (H) 01/29/2020 0856   HDL 25.20 (L) 01/29/2020 0856   CHOLHDL 4 01/29/2020 0856   VLDL 40.2 (H) 01/29/2020 0856   LDLCALC 38 10/04/2019 0802   LDLDIRECT 58.0 01/29/2020 0856     BP Readings from Last 3 Encounters:  04/16/20 (!) 117/57  03/18/20 107/76  03/18/20 135/78         Assessment/Interventions: Review of patient past medical history, allergies, medications, health status, including review of consultants reports, laboratory and other test data, was performed as part of comprehensive evaluation and provision of chronic care management services.   SDOH (Social Determinants of Health) assessments and interventions performed:    CCM Care Plan  No Known Allergies  Medications Reviewed Today    Reviewed by [name], RPH-CPP (Pharmacist) on 04/18/20 at 1548  Med List Status: <None>  Medication Order Taking? Sig Documenting Provider Last Dose Status Informant  acetaminophen  (TYLENOL) 500 MG tablet [id number] Yes Take 500 mg by mouth every 6 (six) hours as needed. [provider] Taking Active   Continuous Blood Gluc Receiver (FREESTYLE LIBRE 14 DAY READER) [place] [id number] Yes Use to check BS 4 times daily [name], MD Taking Active   Continuous Blood Gluc Sensor (FREESTYLE LIBRE [place]) [place] [id number] Yes Use to check BS 4 times daily [name], MD Taking Active   denosumab (PROLIA) 60 MG/ML SOSY injection [id number]  Inject into the skin. [provider]  Active   furosemide (LASIX) 40 MG tablet [id number] Yes TAKE ONE TABLET EVERY DAY  Patient taking differently: Taking PRN, about every other day   [name], MD Taking Active   HYDROcodone-acetaminophen [facility]) 10-325 MG tablet [id number] Yes Take 1 tablet by mouth every 4 (four) hours as needed (breakthrough pain). Borders, [name], NP Taking Active            Med Note Nat Christen Apr 18, 2020  3:43 PM) 1 tab QAM usually  insulin aspart (NOVOLOG) 100 UNIT/ML injection [id number] Yes Inject 5 Units into the skin 3 (three) times daily before meals. [name], MD Taking Active   Insulin Pen Needle 32G X 6 MM MISC [id number] Yes 1 Stick by Does not apply route daily. [name], MD Taking Active   LANTUS SOLOSTAR 100 UNIT/ML Solostar Pen [id number] Yes INJECT 20 UNITS INTO THE SKIN DAILY AS DIRECTED  Patient taking differently: Inject 15 Units into the skin  daily.   [name], MD Taking Active   levETIRAcetam (KEPPRA) 500 MG tablet [id number] Yes Take 1 tablet (500 mg total) by mouth 2 (two) times daily. Borders, [name], NP Taking Active   losartan (COZAAR) 50 MG tablet [id number] Yes TAKE ONE TABLET EVERY DAY [name], MD Taking Active Self  morphine (MS CONTIN) 15 MG 12 hr tablet [id number] Yes Take 1 tablet (15 mg total) by mouth every 12 (twelve) hours. Borders, [name], NP Taking Active   naloxone [facility]) nasal spray 4 mg/0.1 mL [id number]  No 1 spray into nostril upon signs of opioid overdose. Call 911. May repeat once if no response within 2-3 minutes.  Patient not taking: Reported on 04/18/2020   Borders, [name], NP Not Taking Active   omeprazole (PRILOSEC) 40 MG capsule [id number] Yes TAKE 1 CAPSULE BY MOUTH ONCE DAILY [name], MD Taking Active   ondansetron (ZOFRAN) 8 MG tablet [id number] Yes One pill every 8 hours as needed for nausea/vomitting. [name], MD Taking Active   [facility] ULTRA test strip [id number] Yes USE AS INSTRUCTED [name], MD Taking Active   prochlorperazine (COMPAZINE) 10 MG tablet [id number] Yes Take 1 tablet (10 mg total) by mouth every 6 (six) hours as needed for nausea or vomiting. [name], MD Taking Active   rosuvastatin (CRESTOR) 10 MG tablet [id number] Yes TAKE ONE TABLET EVERY DAY [name], MD Taking Active   senna-docusate (SENOKOT-S) 8.6-50 MG tablet [id number] Yes Take 2 tablets by mouth daily as needed for mild constipation. McKenzie, Candee Furbish, MD Taking Active Self  sucralfate (CARAFATE) 1 g tablet [id number] Yes Take 0.5 tablets (0.5 g total) by mouth 3 (three) times daily. Dissolve in 3-4 tbsp warm water, swallow. [name], MD Taking Active            Med Note Nat Christen Apr 18, 2020  3:46 PM) Taking PRN  traZODone (DESYREL) 50 MG tablet [id number] No Take 1 tablet (50 mg total) by mouth at bedtime as needed for sleep.  Patient not taking: Reported on 04/18/2020   [name], MD Not Taking Active   Vitamin D, Ergocalciferol, (DRISDOL) 1.25 MG (50000 UNIT) CAPS capsule [id number] Yes TAKE ONE CAPSULE EACH WEEK [name], MD Taking Active Self          Patient Active Problem List   Diagnosis Date Noted  . Aortic arch atherosclerosis ([place]) 03/19/2020  . Aortic aneurysm ([place]) 03/19/2020  . Primary cancer of left upper lobe of lung ([place]) 02/26/2020  . Aneurysm of cavernous portion of right internal carotid  artery 02/03/2020  . Brain mass 02/02/2020  . Brain metastasis ([place]) 02/02/2020  . Obesity (BMI 30.0-34.9)   . Back pain, chronic 01/22/2020  . OAB (overactive bladder) 12/13/2019  . Chronic cystitis 10/08/2019  . Pelvic mass in female 03/28/2019  . Thoracic spondylosis without myelopathy 03/28/2019  . Osteoporosis 11/12/2017  . Urge incontinence of urine 02/25/2016  . Hospital discharge follow-up 10/26/2015  . Benign essential HTN 10/25/2015  . Atrophic kidney 10/25/2015  . History of myocardial infarction 10/25/2015  . Microalbuminuria due to type 2 diabetes mellitus ([place]) 06/29/2015  . Personal history of gastric ulcer 02/23/2013  . Nontraumatic shoulder pain, right 02/23/2013  . Vitamin D deficiency 02/23/2013  . History of right humeral fracture  02/16/2012  . Gouty arthritis 08/25/2011  . Type 2 diabetes mellitus, controlled, with renal complications ([place]) 92/05/69  . Hemorrhoid  05/27/2011  . Dyslipidemia associated with type 2 diabetes mellitus ([place]) 02/16/2011  . Cataract extraction status 02/16/2011  . Nonfunctioning left kidney 02/16/2011  . Cirrhosis, non-alcoholic ([place]) 70/14/1030    Conditions to be addressed/monitored per PCP order: HTN, HLD and DMII  Patient Care Plan: Medication Management    Problem Identified: Diabetes, Cancer     Long-Range Goal: Disease State Management   Start Date: 03/19/2020  This Visit's Progress: On track  Recent Progress: On track  Priority: High  Note:   Current Barriers:  . Unable to achieve control of diabetes  . Recent complex diagnosis  Pharmacist Clinical Goal(s):  Marland Kitchen Over the next 90 days, patient will achieve control of diabetes as evidenced by improvement in A1c.  Interventions: . Inter-disciplinary care team collaboration (see longitudinal plan of care) . Comprehensive medication review performed; medication list updated in electronic medical record  Diabetes: . Uncontrolled; current treatment: Lantus 15 units  daily; Novolog prescribed, but patient has not picked up yet . Current glucose readings:  Date of Download: 12/3-12/16/12 % Time CGM is active: 15% Average Glucose: 155 mg/dL Time in Goal:  - Time in range 70-180: 83% - Time above range: 17% - Time below range: 0% Observed patterns: too little data to see patterns . Reviewed that we are only capturing a small percentage of her glucose data. Encouraged to try to scan sugar at least Q8hours while awake. The few mealtime elevations that I do see are not significant enough to require Novolog at this point, given variable appetite with radiation.  . Continue current regimen of Lantus 15 units daily at this time.   Hypertension: . Controlled; current treatment: losartan 50 mg daily, furosemide 40 mg PRN - usually every other day . Recommended to continue current regimen  Hyperlipidemia: . Controlled; current treatment: rosuvastatin 10 mg daily . Recommended to continue current treatment regimen  Lung Cancer, Symptom Management: . Seizure (brain metastasis): levetiracetam 500 mg BID . Nausea: ondansetron 4 mg PRN, procholorperazine PRN. Notes one day that she had nausea that she took ondansetron . Pain: morphine 15 mg BID, hydrocodone/acetaminophen 10/325 mg Q6H PRN. Notes that she usually takes acetaminophen 500 mg + hydrocodone/acetaminophen 10/325 mg QAM when she first wakes up; she notes the fever sensation has been the worst symptom. Typically not needing a second dose of hydrocodone/acetaminophen during the day . Constipation: senna/docusate PRN. Notes she is needing every few days . Mood/sleep: trazodone 50 mg daily- not taking since starting on morphine  GERD: . Controlled per patient report. Current treatment: omeprazole 40 mg daily, sucralfate 0.5 g PRN acid related swallowing issues. Patient notes very infrequent need for sucralfate . Recommended to continue current regimen  Patient Goals/Self-Care Activities . Over the next 90  days, patient will:  - check blood glucose using CGM at least TID, document, and provide at future appointments  Follow Up Plan: Telephone follow up appointment with care management team member scheduled for: ~5 weeks      Medication Assistance: None required. Patient affirms current coverage meets needs.   Plan: Telephone follow up appointment with care management team member scheduled for: ~ 6 weeks  Catie Darnelle Maffucci, PharmD, [place], [place] Clinical Pharmacist [employer] [facility] [employer] [phone]

## 2020-04-19 ENCOUNTER — Ambulatory Visit
Admission: RE | Admit: 2020-04-19 | Discharge: 2020-04-19 | Disposition: A | Discharge status: Home / Self Care | Payer: PPO | Source: Ambulatory Visit | Attending: Radiation Oncology | Admitting: Radiation Oncology

## 2020-04-19 DIAGNOSIS — Z510 Encounter for antineoplastic radiation therapy: Secondary | ICD-10-CM | POA: Diagnosis not present

## 2020-04-19 DIAGNOSIS — C3492 Malignant neoplasm of unspecified part of left bronchus or lung: Secondary | ICD-10-CM | POA: Diagnosis not present

## 2020-04-22 ENCOUNTER — Ambulatory Visit
Admission: RE | Admit: 2020-04-22 | Discharge: 2020-04-22 | Disposition: A | Discharge status: Home / Self Care | Payer: PPO | Source: Ambulatory Visit | Attending: Radiation Oncology | Admitting: Radiation Oncology

## 2020-04-22 ENCOUNTER — Other Ambulatory Visit: Payer: Self-pay | Admitting: *Deleted

## 2020-04-22 DIAGNOSIS — Z510 Encounter for antineoplastic radiation therapy: Secondary | ICD-10-CM | POA: Diagnosis not present

## 2020-04-22 DIAGNOSIS — C3492 Malignant neoplasm of unspecified part of left bronchus or lung: Secondary | ICD-10-CM | POA: Diagnosis not present

## 2020-04-22 MED ORDER — LOSARTAN POTASSIUM 50 MG PO TABS: 50.0000 mg | ORAL_TABLET | Freq: Every day | ORAL | 3 refills | Status: AC

## 2020-04-22 MED ORDER — HYDROCODONE-ACETAMINOPHEN 10-325 MG PO TABS: 1.0000 | ORAL_TABLET | ORAL | 0 refills | Status: DC | PRN

## 2020-04-22 MED ORDER — MORPHINE SULFATE ER 15 MG PO TBCR
15.0000 mg | EXTENDED_RELEASE_TABLET | Freq: Two times a day (BID) | ORAL | 0 refills | Status: DC
Start: 2020-04-22 — End: 2020-05-06

## 2020-04-22 NOTE — Telephone Encounter (Signed)
error 

## 2020-04-23 ENCOUNTER — Ambulatory Visit: Payer: PPO | Admitting: Infectious Diseases

## 2020-04-23 ENCOUNTER — Ambulatory Visit
Admission: RE | Admit: 2020-04-23 | Discharge: 2020-04-23 | Disposition: A | Discharge status: Home / Self Care | Payer: PPO | Source: Ambulatory Visit | Attending: Radiation Oncology | Admitting: Radiation Oncology

## 2020-04-23 DIAGNOSIS — Z510 Encounter for antineoplastic radiation therapy: Secondary | ICD-10-CM | POA: Diagnosis not present

## 2020-04-23 DIAGNOSIS — C3492 Malignant neoplasm of unspecified part of left bronchus or lung: Secondary | ICD-10-CM | POA: Diagnosis not present

## 2020-04-24 ENCOUNTER — Ambulatory Visit
Admission: RE | Admit: 2020-04-24 | Discharge: 2020-04-24 | Disposition: A | Discharge status: Home / Self Care | Payer: PPO | Source: Ambulatory Visit | Attending: Radiation Oncology | Admitting: Radiation Oncology

## 2020-04-24 DIAGNOSIS — Z510 Encounter for antineoplastic radiation therapy: Secondary | ICD-10-CM | POA: Diagnosis not present

## 2020-04-24 DIAGNOSIS — C3492 Malignant neoplasm of unspecified part of left bronchus or lung: Secondary | ICD-10-CM | POA: Diagnosis not present

## 2020-04-25 ENCOUNTER — Encounter: Payer: Self-pay | Admitting: Infectious Diseases

## 2020-04-25 ENCOUNTER — Ambulatory Visit: Payer: PPO | Attending: Infectious Diseases | Admitting: Infectious Diseases

## 2020-04-25 ENCOUNTER — Ambulatory Visit
Admission: RE | Admit: 2020-04-25 | Discharge: 2020-04-25 | Disposition: A | Discharge status: Home / Self Care | Payer: PPO | Source: Ambulatory Visit | Attending: Radiation Oncology | Admitting: Radiation Oncology

## 2020-04-25 VITALS — BP 120/78 | HR 83 | Temp 98.6°F | Resp 16 | Ht 63.0 in | Wt 183.0 lb

## 2020-04-25 DIAGNOSIS — Z510 Encounter for antineoplastic radiation therapy: Secondary | ICD-10-CM | POA: Diagnosis not present

## 2020-04-25 DIAGNOSIS — C3492 Malignant neoplasm of unspecified part of left bronchus or lung: Secondary | ICD-10-CM | POA: Diagnosis not present

## 2020-04-25 DIAGNOSIS — Z87891 Personal history of nicotine dependence: Secondary | ICD-10-CM | POA: Diagnosis not present

## 2020-04-25 DIAGNOSIS — Z87440 Personal history of urinary (tract) infections: Secondary | ICD-10-CM | POA: Diagnosis not present

## 2020-04-25 DIAGNOSIS — Z923 Personal history of irradiation: Secondary | ICD-10-CM | POA: Insufficient documentation

## 2020-04-25 DIAGNOSIS — N2889 Other specified disorders of kidney and ureter: Secondary | ICD-10-CM | POA: Insufficient documentation

## 2020-04-25 DIAGNOSIS — C7931 Secondary malignant neoplasm of brain: Secondary | ICD-10-CM | POA: Diagnosis not present

## 2020-04-25 DIAGNOSIS — Z905 Acquired absence of kidney: Secondary | ICD-10-CM | POA: Diagnosis not present

## 2020-04-25 DIAGNOSIS — C3490 Malignant neoplasm of unspecified part of unspecified bronchus or lung: Secondary | ICD-10-CM | POA: Insufficient documentation

## 2020-04-25 DIAGNOSIS — Z2239 Carrier of other specified bacterial diseases: Secondary | ICD-10-CM

## 2020-04-25 NOTE — Patient Instructions (Addendum)
You are here for ESBL e.coli in the urine- you had left kidney removal for chronic infection in jan 2021- Your kidney function is normal.you dont have any symptoms that you say when you get UTI like burning, fever, pain abdomen or flank pain- so will not treat this bacteria You are colonized with this bacteria in your urine and vaginal area. and you can do the following to prevent infection  * Cetaphil to clean the genital area ( not soap)  * Cranberry supplement (-Knudsen cranberry concentrate- 1 ounce mixed with 8 ounces of water *wash with water after bowel movt *Probiotic for vaginal health( can try [facility] vaginal health) * Increase water consumption- 8 glasses a day *Ask your doctors not to check your urine on a routine basis  *Avoid antibiotics unless systemic infection/ or before cystoscopy * Kegel Exercise to strengthen pelvic floor *Estrace /Premarin cream topically- peasized apply topically three times a week  If you develop any UTI symptoms you will need IV antibiotic as there is no pill option. You also told me you stopped the macrobid prophylaxis 2-3 months ago and that is fine as it is not working for the bacteria anyways

## 2020-04-25 NOTE — Progress Notes (Signed)
NAME: Kimberly Huff  DOB: 1945/09/19  MRN: [id number]  Date/Time: 04/25/2020 10:29 AM   Subjective:   ? Kimberly Huff is a 74 y.o. female with a history of left nephrectomy on 06/02/2019, secondary to nonfunctioning kidney due to recurrent infection, newly diagnosed left CA lung and cerebral mets, is referred to me by oncologist for ESBL E. coli in the urine.  She has had recurrent urinary tract infection for the past many years which has gotten worse since the last 2 years.  As the left kidney was nonfunctioning and atrophic she underwent laparoscopic nephrectomy on 06/02/2019.  The pathology was segmental glomerulosclerosis, tubular atrophy and fibrosis chronic pyelonephritis consistent with end-stage kidney disease.  She was asked to take 100 mg of Macrobid every day. In May 2021 patient underwent cystoscopy for a bladder lesion and had biopsy with fulguration.  The pathology was just inflammation and denuded urothelium.  No malignancy.  On February 02, 2020 patient presented to the ED with slurred speech and weakness on the left side.Marland Kitchen  She was hospitalized and MRI of the brain showed 2 enhancing mass lesions in the right cerebral hemisphere with surrounding edema and central necrosis compatible with metastatic disease.  CT scan of the chest showed.  I 8 cm left upper lung mass inseparable from the mediastinum compatible with bronchogenic carcinoma.  Patient was started on steroids.  On 02/21/2020 she underwent bronchoscopy with biopsy and that showed a non-small cell carcinoma.  She was started on SBRT and palliative radiation therapy for the left upper lobe mass.  Patient had routine urine culture which came back positive for ESBL E. coli on 03/26/2020 and she is referred to me for that. Patient states she does not have any dysuria or abdominal pain or flank pain or fever.  She says she has had multiple UTIs in the past and she knows the symptoms and currently she does not have any.  She even stopped  the Macrobid that was given to her by her urologist a few months ago. Patient is overwhelmed with cancer treatment now.  She is getting radiation therapy to her left lung mass until May 14, 2020.  Her oncologist Dr. Rogue Bussing is planning to do a repeat CT chest in mid January and then decide on further management.   Past Medical History:  Diagnosis Date  . Allergy   . Atrophic kidney    left  . Blood clot in vein 45 yrs ago   inabdoment after surgery  . Blood transfusion without reported diagnosis   . Cataract   . Complication of anesthesia   . Coronary artery disease   . Coronary atherosclerosis   . Diabetes mellitus   . Diverticulosis 2005   Dr Jamal Collin  . Dyspnea    with exertion  . External hemorrhoids without mention of complication 3419  . GERD (gastroesophageal reflux disease)   . H/O cardiac catheterization 2005  . Heart murmur 2012   found by Dr. Jamal Collin  . Hyperlipidemia   . Hypertension   . Kidney stone   . Lesion of bladder   . Myocardial infarction ([place]) 2000  . Neuromuscular disorder ([place])    "achy muscles"  . Obesity   . Osteoarthritis   . PONV (postoperative nausea and vomiting)   . Recurrent UTI   . Seizure ([place])   . Spinal stenosis of cervical region   . Steatohepatitis 05/30/10   Brunt Stage 3, non alcoholic cirrhosis of the liver  . Type II or unspecified  type diabetes mellitus without mention of complication, uncontrolled    Patient does takes Metformin and recommended to stop for 48 hours.  Marland Kitchen Ulcer   . Unspecified essential hypertension 2014    Past Surgical History:  Procedure Laterality Date  . ABDOMINAL HYSTERECTOMY  1982   complete  . BREAST CYST ASPIRATION Right 1990's   benign  . BREAST MASS EXCISION     benign  . CARDIAC CATHETERIZATION  2000   cardiac stent  . cardiac stents  2000  . CATARACT EXTRACTION     both eyes  . COLONOSCOPY  2004, 2015   Dr. Jamal Collin, Dr. Olevia Perches  . CYSTOSCOPY W/ RETROGRADES Left 11/12/2015   Procedure:  CYSTOSCOPY WITH RETROGRADE PYELOGRAM;  Surgeon: Cleon Gustin, MD;  Location: [facility];  Service: Urology;  Laterality: Left;  . CYSTOSCOPY W/ URETERAL STENT PLACEMENT Left 11/12/2015   Procedure: CYSTOSCOPY WITH STENT REPLACEMENT;  Surgeon: Cleon Gustin, MD;  Location: [facility];  Service: Urology;  Laterality: Left;  . CYSTOSCOPY WITH BIOPSY N/A 09/26/2019   Procedure: CYSTOSCOPY WITH BIOPSY AND FULGURATION;  Surgeon: Cleon Gustin, MD;  Location[facility];  Service: Urology;  Laterality: N/A;  . CYSTOSCOPY WITH URETEROSCOPY AND STENT PLACEMENT Left 10/15/2015   Procedure: CYSTOSCOPY WITH URETEROSCOPY AND STENT PLACEMENT;  Surgeon: Cleon Gustin, MD;  Location: [facility];  Service: Urology;  Laterality: Left;  . EYE SURGERY Bilateral 2012   cataract  . FOOT SURGERY  2008  . PTCA  2000   stent x 1  . ROBOTIC ASSITED PARTIAL NEPHRECTOMY Left 06/02/2019   Procedure: XI ROBOTIC ASSITED NEPHRECTOMY;  Surgeon: Cleon Gustin, MD;  Location: [facility];  Service: Urology;  Laterality: Left;  . SALPINGOOPHORECTOMY    . TONSILLECTOMY    . UPPER GI ENDOSCOPY    . URETEROSCOPY WITH HOLMIUM LASER LITHOTRIPSY Left 11/12/2015   Procedure: URETEROSCOPY WITH HOLMIUM LASER LITHOTRIPSY;  Surgeon: Cleon Gustin, MD;  Location: [facility];  Service: Urology;  Laterality: Left;  . URETEROTOMY  1960/1969   x 2 during childhood  . VAGINAL HYSTERECTOMY    . VIDEO BRONCHOSCOPY WITH ENDOBRONCHIAL NAVIGATION N/A 02/21/2020   Procedure: VIDEO BRONCHOSCOPY WITH ENDOBRONCHIAL NAVIGATION;  Surgeon: Ottie Glazier, MD;  Location: [facility];  Service: Thoracic;  Laterality: N/A;  . VIDEO BRONCHOSCOPY WITH ENDOBRONCHIAL ULTRASOUND N/A 02/21/2020   Procedure: VIDEO BRONCHOSCOPY WITH ENDOBRONCHIAL ULTRASOUND;  Surgeon: Ottie Glazier, MD;  Location: [facility];  Service: Thoracic;  Laterality: N/A;    Social History   Socioeconomic History  . Marital status: Single    Spouse name:  Not on file  . Number of children: 2  . Years of education: Not on file  . Highest education level: Not on file  Occupational History  . 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 Smoking status: Former Smoker    Packs/day: 0.50    Years: 38.00    Pack years: 19.00    Types: Cigarettes    Quit date: 09/15/1998    Years since quitting: 21.6  . Smokeless tobacco: Never Used  Vaping Use  . Vaping Use: Never used  Substance and Sexual Activity  . Alcohol use: Not Currently  . Drug use: No  . Sexual activity: Not Currently  Other Topics Concern  . Not on file  Social History Narrative   Lives in [place]; daughter lives in [place]. Quit smoking may 14th 2000. No alcohol. Retired  From  newspaper/desk job.    Social Determinants of Health   Financial Resource Strain: Low Risk   . Difficulty of Paying Living Expenses: Not hard at all  Food Insecurity: No Food Insecurity  . Worried About Charity fundraiser in the Last Year: Never true  . Ran Out of Food in the Last Year: Never true  Transportation Needs: No Transportation Needs  . Lack of Transportation (Medical): No  . Lack of Transportation (Non-Medical): No  Physical Activity: Not on file  Stress: Stress Concern Present  . Feeling of Stress : To some extent  Social Connections: Unknown  . Frequency of Communication with Friends and Family: More than three times a week  . Frequency of Social Gatherings with Friends and Family: More than three times a week  . Attends Religious Services: More than 4 times per year  . Active Member of Clubs or Organizations: Yes  . Attends Archivist Meetings: More than 4 times per year  . Marital Status: Not on file  Intimate Partner Violence: Not At Risk  . Fear of Current or Ex-Partner: No  . Emotionally Abused: No  . Physically Abused: No  . Sexually Abused: No    Family History  Problem Relation Age of Onset  . Lung cancer Father    . Heart disease Father   . Diverticulosis Mother   . Diabetes Mother   . Stroke Mother 53  . Autoimmune disease Daughter   . Thyroid disease Daughter   . Thyroid disease Sister   . Stomach cancer Maternal Aunt   . Breast cancer Maternal Aunt   . Colon cancer Neg Hx   . Esophageal cancer Neg Hx   . Rectal cancer Neg Hx    No Known Allergies  ? Current Outpatient Medications  Medication Sig Dispense Refill  . acetaminophen (TYLENOL) 500 MG tablet Take 500 mg by mouth every 6 (six) hours as needed.    . Continuous Blood Gluc Receiver (FREESTYLE LIBRE 14 DAY READER) DEVI Use to check BS 4 times daily 2 each 2  . Continuous Blood Gluc Sensor (FREESTYLE LIBRE 14 DAY SENSOR) MISC Use to check BS 4 times daily 2 each 2  . denosumab (PROLIA) 60 MG/ML SOSY injection Inject into the skin.    . furosemide (LASIX) 40 MG tablet TAKE ONE TABLET EVERY DAY (Patient taking differently: Taking PRN, about every other day) 90 tablet 1  . HYDROcodone-acetaminophen (NORCO) 10-325 MG tablet Take 1 tablet by mouth every 4 (four) hours as needed (breakthrough pain). 60 tablet 0  . Insulin Pen Needle 32G X 6 MM MISC 1 Stick by Does not apply route daily. 100 each 1  . LANTUS SOLOSTAR 100 UNIT/ML Solostar Pen INJECT 20 UNITS INTO THE SKIN DAILY AS DIRECTED (Patient taking differently: Inject 15 Units into the skin daily.) 15 mL PRN  . levETIRAcetam (KEPPRA) 500 MG tablet Take 1 tablet (500 mg total) by mouth 2 (two) times daily. 60 tablet 0  . losartan (COZAAR) 50 MG tablet Take 1 tablet (50 mg total) by mouth daily. 90 tablet 3  . morphine (MS CONTIN) 15 MG 12 hr tablet Take 1 tablet (15 mg total) by mouth every 12 (twelve) hours. 30 tablet 0  . naloxone (NARCAN) nasal spray 4 mg/0.1 mL 1 spray into nostril upon signs of opioid overdose. Call 911. May repeat once if no response within 2-3 minutes. 1 each 0  . omeprazole (PRILOSEC) 40 MG capsule TAKE 1 CAPSULE BY MOUTH ONCE DAILY  90 capsule 1  . ondansetron  (ZOFRAN) 8 MG tablet One pill every 8 hours as needed for nausea/vomitting. 40 tablet 1  . ONETOUCH ULTRA test strip USE AS INSTRUCTED 100 each 12  . prochlorperazine (COMPAZINE) 10 MG tablet Take 1 tablet (10 mg total) by mouth every 6 (six) hours as needed for nausea or vomiting. 40 tablet 1  . rosuvastatin (CRESTOR) 10 MG tablet TAKE ONE TABLET EVERY DAY 90 tablet 1  . senna-docusate (SENOKOT-S) 8.6-50 MG tablet Take 2 tablets by mouth daily as needed for mild constipation. 30 tablet 1  . sucralfate (CARAFATE) 1 g tablet Take 0.5 tablets (0.5 g total) by mouth 3 (three) times daily. Dissolve in 3-4 tbsp warm water, swallow. 60 tablet 1  . Vitamin D, Ergocalciferol, (DRISDOL) 1.25 MG (50000 UNIT) CAPS capsule TAKE ONE CAPSULE EACH WEEK 4 capsule 11   No current facility-administered medications for this visit.     Abtx:  Anti-infectives (From admission, onward)   None      REVIEW OF SYSTEMS:  Const: negative fever, negative chills, negative weight loss Eyes: negative diplopia or visual changes, negative eye pain ENT: negative coryza, negative sore throat Resp: negative cough, had hemoptysis, dyspnea Cards: negative for chest pain, palpitations, lower extremity edema GU: negative for frequency, dysuria and hematuria GI: Negative for abdominal pain, diarrhea, bleeding, constipation, She has trouble swallowing water- feels like it is getting stuck in the midsternumSkin: negative for rash and pruritus Heme: negative for easy bruising and gum/nose bleeding MS: fatigue, back pain and muscle weakness Neurolo: headaches, dizziness, no vertigo, memory problems  Psych: negative for feelings of anxiety, depression  Endocrine: negative for thyroid, diabetes Allergy/Immunology- negative for any medication or food allergies ?  Objective:  VITALS:  BP 120/78   Pulse 83   Temp 98.6 F (37 C)   Resp 16   Ht 5' 3"  (1.6 m)   Wt 183 lb (83 kg)   SpO2 95%   BMI 32.42 kg/m  PHYSICAL EXAM:   General: Alert, cooperative, no distress, appears stated age.  Head: Normocephalic, without obvious abnormality, atraumatic. Eyes: Conjunctivae clear, anicteric sclerae. Pupils are equal ENT Nares normal. No drainage or sinus tenderness. Lips, mucosa, and tongue normal. No Thrush Neck: Supple, symmetrical, no adenopathy, thyroid: non tender no carotid bruit and no JVD. Back: No CVA tenderness. Lungs: b/l air entry- decreased left upper lobe . Heart: Regular rate and rhythm, no murmur, rub or gallop. Abdomen: Soft, non-tender,not distended. Bowel sounds normal. No masses Extremities: atraumatic, no cyanosis. No edema. No clubbing Skin: No rashes or lesions. Or bruising Lymph: Cervical, supraclavicular normal. Neurologic: Grossly non-focal Pertinent Labs Lab Results CBC    Component Value Date/Time   WBC 6.3 04/16/2020 0938   RBC 4.42 04/16/2020 0938   HGB 11.7 (L) 04/16/2020 0938   HCT 38.0 04/16/2020 0938   PLT 214 04/16/2020 0938   MCV 86.0 04/16/2020 0938   MCH 26.5 04/16/2020 0938   MCHC 30.8 04/16/2020 0938   RDW 15.4 04/16/2020 0938   LYMPHSABS 0.9 04/16/2020 0938   MONOABS 0.4 04/16/2020 0938   EOSABS 0.3 04/16/2020 0938   BASOSABS 0.0 04/16/2020 0938    CMP Latest Ref Rng & Units 04/16/2020 03/26/2020 03/18/2020  Glucose 70 - 99 mg/dL 139(H) 188(H) 175(H)  BUN 8 - 23 mg/dL 8 8 12   Creatinine 0.44 - 1.00 mg/dL 0.64 0.72 0.84  Sodium 135 - 145 mmol/L 136 135 136  Potassium 3.5 - 5.1 mmol/L 4.0 3.3(L) 3.9  Chloride 98 - 111 mmol/L 101 96(L) 100  CO2 22 - 32 mmol/L 23 28 28   Calcium 8.9 - 10.3 mg/dL 8.4(L) 8.4(L) 9.1  Total Protein 6.5 - 8.1 g/dL 6.5 - 6.5  Total Bilirubin 0.3 - 1.2 mg/dL 0.6 - 0.8  Alkaline Phos 38 - 126 U/L 36(L) - 31(L)  AST 15 - 41 U/L 14(L) - 14(L)  ALT 0 - 44 U/L 9 - 19      Microbiology: No results found for this or any previous visit (from the past 240 hour(s)).  IMAGING RESULTS:  I have personally reviewed the films  ? Two  enhancing mass lesions with central necrosis located in the right frontal operculum (13 mm) and right posterior temporal lobe (10 mm) consistent with metastatic disease. Surrounding vasogenic edema.  Impression/Recommendation 74 year old female with history of recurrent UTI in the past leading to atrophic nonfunctioning left kidney status post nephrectomy January 2021 newly diagnosed stage IV metastatic lung cancer ?  Single kidney.  Status post left nephrectomy January 2021.  Normal renal function with creatinine clearance of more than 60.  ?ESBL E. coli in the urine with no symptoms.  This is asymptomatic bacteriuria.  Likely colonized/carrier state with this bacteria.  Recommend not checking urine routinely unless the patient has a fever or symptoms suggestive of UTI or is is to undergo a cystoscopy.  If the patient is going to have chemotherapy then would observe closely for any symptoms of UTI but still will not routinely check the urine or treat empirically. Patient is in total agreement with this plan.  She was even surprised why she had to see me. She has stopped taking nitrofurantoin few months ago and that is totally fine as it is resistant.  She does not have any oral antibiotic options. Explained to the patient to follow certain precautions to reduce colonization *Estrace /Premarin cream topically- peasized apply topically three times a week * Cetaphil to clean the genital area ( not soap)  * Cranberry supplement (-Knudsen cranberry concentrate- 1 ounce mixed with 8 ounces of water *wash with water after bowel movt *Probiotic for vaginal health( can try Pearls vaginal health) * Increase water consumption- 8 glasses a day *Ask your doctors not to check your urine on a routine basis  *Avoid antibiotics unless systemic infection/ or before cystoscopy * Kegel Exercise to strengthen pelvic floor.   _Stage IV metastatic lung cancer with brain mets.  Status post SBRT.  Now getting  radiation to the lungs.   will see the patient as needed.  __________________________________________________ Discussed with patient in great detail and addressed her concerns.. Note:  This document was prepared using Dragon voice recognition software and may include unintentional dictation errors.

## 2020-04-29 ENCOUNTER — Ambulatory Visit
Admission: RE | Admit: 2020-04-29 | Discharge: 2020-04-29 | Disposition: A | Discharge status: Home / Self Care | Payer: PPO | Source: Ambulatory Visit | Attending: Radiation Oncology | Admitting: Radiation Oncology

## 2020-04-29 DIAGNOSIS — C3492 Malignant neoplasm of unspecified part of left bronchus or lung: Secondary | ICD-10-CM | POA: Diagnosis not present

## 2020-04-29 DIAGNOSIS — Z510 Encounter for antineoplastic radiation therapy: Secondary | ICD-10-CM | POA: Diagnosis not present

## 2020-04-30 ENCOUNTER — Ambulatory Visit
Admission: RE | Admit: 2020-04-30 | Discharge: 2020-04-30 | Disposition: A | Discharge status: Home / Self Care | Payer: PPO | Source: Ambulatory Visit | Attending: Radiation Oncology | Admitting: Radiation Oncology

## 2020-04-30 DIAGNOSIS — C3492 Malignant neoplasm of unspecified part of left bronchus or lung: Secondary | ICD-10-CM | POA: Diagnosis not present

## 2020-04-30 DIAGNOSIS — Z510 Encounter for antineoplastic radiation therapy: Secondary | ICD-10-CM | POA: Diagnosis not present

## 2020-05-01 ENCOUNTER — Ambulatory Visit
Admission: RE | Admit: 2020-05-01 | Discharge: 2020-05-01 | Disposition: A | Discharge status: Home / Self Care | Payer: PPO | Source: Ambulatory Visit | Attending: Radiation Oncology | Admitting: Radiation Oncology

## 2020-05-01 ENCOUNTER — Other Ambulatory Visit: Payer: Self-pay | Admitting: Internal Medicine

## 2020-05-01 ENCOUNTER — Ambulatory Visit
Admission: RE | Admit: 2020-05-01 | Discharge: 2020-05-01 | Disposition: A | Discharge status: Home / Self Care | Payer: PPO | Source: Ambulatory Visit | Attending: Internal Medicine | Admitting: Internal Medicine

## 2020-05-01 ENCOUNTER — Telehealth: Payer: Self-pay | Admitting: *Deleted

## 2020-05-01 ENCOUNTER — Other Ambulatory Visit: Payer: Self-pay

## 2020-05-01 DIAGNOSIS — C3490 Malignant neoplasm of unspecified part of unspecified bronchus or lung: Secondary | ICD-10-CM | POA: Diagnosis not present

## 2020-05-01 DIAGNOSIS — C3492 Malignant neoplasm of unspecified part of left bronchus or lung: Secondary | ICD-10-CM | POA: Diagnosis not present

## 2020-05-01 DIAGNOSIS — C7931 Secondary malignant neoplasm of brain: Secondary | ICD-10-CM | POA: Insufficient documentation

## 2020-05-01 DIAGNOSIS — G936 Cerebral edema: Secondary | ICD-10-CM | POA: Diagnosis not present

## 2020-05-01 DIAGNOSIS — C3412 Malignant neoplasm of upper lobe, left bronchus or lung: Secondary | ICD-10-CM | POA: Diagnosis not present

## 2020-05-01 DIAGNOSIS — G9389 Other specified disorders of brain: Secondary | ICD-10-CM | POA: Diagnosis not present

## 2020-05-01 DIAGNOSIS — J3489 Other specified disorders of nose and nasal sinuses: Secondary | ICD-10-CM | POA: Diagnosis not present

## 2020-05-01 DIAGNOSIS — Z510 Encounter for antineoplastic radiation therapy: Secondary | ICD-10-CM | POA: Diagnosis not present

## 2020-05-01 MED ORDER — GADOBUTROL 1 MMOL/ML IV SOLN: 7.5000 mL | Freq: Once | INTRAVENOUS | Status: DC | PRN

## 2020-05-01 NOTE — Telephone Encounter (Signed)
MRI Radiology Tech-contacted cancer center. Patient unable to get peripheral access today for her MRI. multiple iv attempts were made. Patient declined further iv attempts. MRI brain without contrast performed. Patient requested to leave after MRI without. Radiology staff made her aware that the images without contrast may limit treatment recommendations. She gave verbal understanding of this. She understood that she may require additional MRI brain with contrast in future.  RN spoke with Ander Purpura, NP who agreed to sign new orders for MRI Brain Without.

## 2020-05-02 ENCOUNTER — Ambulatory Visit
Admission: RE | Admit: 2020-05-02 | Discharge: 2020-05-02 | Disposition: A | Discharge status: Home / Self Care | Payer: PPO | Source: Ambulatory Visit | Attending: Radiation Oncology | Admitting: Radiation Oncology

## 2020-05-02 DIAGNOSIS — C3492 Malignant neoplasm of unspecified part of left bronchus or lung: Secondary | ICD-10-CM | POA: Diagnosis not present

## 2020-05-02 DIAGNOSIS — Z510 Encounter for antineoplastic radiation therapy: Secondary | ICD-10-CM | POA: Diagnosis not present

## 2020-05-03 ENCOUNTER — Other Ambulatory Visit: Payer: Self-pay | Admitting: Internal Medicine

## 2020-05-06 ENCOUNTER — Other Ambulatory Visit: Payer: Self-pay | Admitting: Internal Medicine

## 2020-05-06 ENCOUNTER — Ambulatory Visit
Admission: RE | Admit: 2020-05-06 | Discharge: 2020-05-06 | Disposition: A | Discharge status: Home / Self Care | Payer: PPO | Source: Ambulatory Visit | Attending: Radiation Oncology | Admitting: Radiation Oncology

## 2020-05-06 ENCOUNTER — Ambulatory Visit: Payer: PPO

## 2020-05-06 ENCOUNTER — Other Ambulatory Visit: Payer: Self-pay | Admitting: *Deleted

## 2020-05-06 DIAGNOSIS — C7931 Secondary malignant neoplasm of brain: Secondary | ICD-10-CM | POA: Diagnosis not present

## 2020-05-06 DIAGNOSIS — C3492 Malignant neoplasm of unspecified part of left bronchus or lung: Secondary | ICD-10-CM | POA: Diagnosis not present

## 2020-05-06 DIAGNOSIS — Z510 Encounter for antineoplastic radiation therapy: Secondary | ICD-10-CM | POA: Diagnosis not present

## 2020-05-06 DIAGNOSIS — C3412 Malignant neoplasm of upper lobe, left bronchus or lung: Secondary | ICD-10-CM | POA: Insufficient documentation

## 2020-05-06 MED ORDER — LEVETIRACETAM 500 MG PO TABS: 500.0000 mg | ORAL_TABLET | Freq: Two times a day (BID) | ORAL | 0 refills | Status: DC

## 2020-05-06 MED ORDER — MORPHINE SULFATE ER 15 MG PO TBCR: 15.0000 mg | EXTENDED_RELEASE_TABLET | Freq: Two times a day (BID) | ORAL | 0 refills | Status: DC

## 2020-05-06 MED ORDER — HYDROCODONE-ACETAMINOPHEN 10-325 MG PO TABS: 1.0000 | ORAL_TABLET | ORAL | 0 refills | Status: DC | PRN

## 2020-05-07 ENCOUNTER — Ambulatory Visit
Admission: RE | Admit: 2020-05-07 | Discharge: 2020-05-07 | Disposition: A | Discharge status: Home / Self Care | Payer: PPO | Source: Ambulatory Visit | Attending: Radiation Oncology | Admitting: Radiation Oncology

## 2020-05-07 DIAGNOSIS — Z510 Encounter for antineoplastic radiation therapy: Secondary | ICD-10-CM | POA: Diagnosis not present

## 2020-05-07 DIAGNOSIS — C3492 Malignant neoplasm of unspecified part of left bronchus or lung: Secondary | ICD-10-CM | POA: Diagnosis not present

## 2020-05-08 ENCOUNTER — Ambulatory Visit
Admission: RE | Admit: 2020-05-08 | Discharge: 2020-05-08 | Disposition: A | Discharge status: Home / Self Care | Payer: PPO | Source: Ambulatory Visit | Attending: Radiation Oncology | Admitting: Radiation Oncology

## 2020-05-08 DIAGNOSIS — Z510 Encounter for antineoplastic radiation therapy: Secondary | ICD-10-CM | POA: Diagnosis not present

## 2020-05-08 DIAGNOSIS — C3492 Malignant neoplasm of unspecified part of left bronchus or lung: Secondary | ICD-10-CM | POA: Diagnosis not present

## 2020-05-09 ENCOUNTER — Ambulatory Visit
Admission: RE | Admit: 2020-05-09 | Discharge: 2020-05-09 | Disposition: A | Discharge status: Home / Self Care | Payer: PPO | Source: Ambulatory Visit | Attending: Radiation Oncology | Admitting: Radiation Oncology

## 2020-05-09 DIAGNOSIS — Z510 Encounter for antineoplastic radiation therapy: Secondary | ICD-10-CM | POA: Diagnosis not present

## 2020-05-09 DIAGNOSIS — C3492 Malignant neoplasm of unspecified part of left bronchus or lung: Secondary | ICD-10-CM | POA: Diagnosis not present

## 2020-05-10 ENCOUNTER — Ambulatory Visit
Admission: RE | Admit: 2020-05-10 | Discharge: 2020-05-10 | Disposition: A | Discharge status: Home / Self Care | Payer: PPO | Source: Ambulatory Visit | Attending: Radiation Oncology | Admitting: Radiation Oncology

## 2020-05-10 DIAGNOSIS — Z510 Encounter for antineoplastic radiation therapy: Secondary | ICD-10-CM | POA: Diagnosis not present

## 2020-05-10 DIAGNOSIS — C3492 Malignant neoplasm of unspecified part of left bronchus or lung: Secondary | ICD-10-CM | POA: Diagnosis not present

## 2020-05-13 ENCOUNTER — Ambulatory Visit
Admission: RE | Admit: 2020-05-13 | Discharge: 2020-05-13 | Disposition: A | Discharge status: Home / Self Care | Payer: PPO | Source: Ambulatory Visit | Attending: Radiation Oncology | Admitting: Radiation Oncology

## 2020-05-13 DIAGNOSIS — Z510 Encounter for antineoplastic radiation therapy: Secondary | ICD-10-CM | POA: Diagnosis not present

## 2020-05-13 DIAGNOSIS — C3492 Malignant neoplasm of unspecified part of left bronchus or lung: Secondary | ICD-10-CM | POA: Diagnosis not present

## 2020-05-14 ENCOUNTER — Encounter: Payer: Self-pay | Admitting: Internal Medicine

## 2020-05-14 ENCOUNTER — Other Ambulatory Visit: Payer: Self-pay

## 2020-05-14 ENCOUNTER — Inpatient Hospital Stay (HOSPITAL_BASED_OUTPATIENT_CLINIC_OR_DEPARTMENT_OTHER): Payer: PPO | Admitting: Hospice and Palliative Medicine

## 2020-05-14 ENCOUNTER — Ambulatory Visit
Admission: RE | Admit: 2020-05-14 | Discharge: 2020-05-14 | Disposition: A | Discharge status: Home / Self Care | Payer: PPO | Source: Ambulatory Visit | Attending: Radiation Oncology | Admitting: Radiation Oncology

## 2020-05-14 ENCOUNTER — Inpatient Hospital Stay: Payer: PPO | Attending: Internal Medicine

## 2020-05-14 ENCOUNTER — Inpatient Hospital Stay (HOSPITAL_BASED_OUTPATIENT_CLINIC_OR_DEPARTMENT_OTHER): Payer: PPO | Admitting: Internal Medicine

## 2020-05-14 DIAGNOSIS — R110 Nausea: Secondary | ICD-10-CM | POA: Diagnosis not present

## 2020-05-14 DIAGNOSIS — R21 Rash and other nonspecific skin eruption: Secondary | ICD-10-CM

## 2020-05-14 DIAGNOSIS — Z515 Encounter for palliative care: Secondary | ICD-10-CM

## 2020-05-14 DIAGNOSIS — M549 Dorsalgia, unspecified: Secondary | ICD-10-CM | POA: Diagnosis not present

## 2020-05-14 DIAGNOSIS — C7931 Secondary malignant neoplasm of brain: Secondary | ICD-10-CM | POA: Insufficient documentation

## 2020-05-14 DIAGNOSIS — C3412 Malignant neoplasm of upper lobe, left bronchus or lung: Secondary | ICD-10-CM

## 2020-05-14 DIAGNOSIS — Z510 Encounter for antineoplastic radiation therapy: Secondary | ICD-10-CM | POA: Diagnosis not present

## 2020-05-14 DIAGNOSIS — Z905 Acquired absence of kidney: Secondary | ICD-10-CM | POA: Insufficient documentation

## 2020-05-14 DIAGNOSIS — E119 Type 2 diabetes mellitus without complications: Secondary | ICD-10-CM | POA: Insufficient documentation

## 2020-05-14 DIAGNOSIS — I10 Essential (primary) hypertension: Secondary | ICD-10-CM | POA: Diagnosis not present

## 2020-05-14 DIAGNOSIS — C3492 Malignant neoplasm of unspecified part of left bronchus or lung: Secondary | ICD-10-CM | POA: Diagnosis not present

## 2020-05-14 DIAGNOSIS — G893 Neoplasm related pain (acute) (chronic): Secondary | ICD-10-CM | POA: Diagnosis not present

## 2020-05-14 DIAGNOSIS — R569 Unspecified convulsions: Secondary | ICD-10-CM | POA: Diagnosis not present

## 2020-05-14 LAB — COMPREHENSIVE METABOLIC PANEL
ALT: 9 U/L (ref 0–44)
AST: 17 U/L (ref 15–41)
Albumin: 3.3 g/dL — ABNORMAL LOW (ref 3.5–5.0)
Alkaline Phosphatase: 50 U/L (ref 38–126)
Anion gap: 9 (ref 5–15)
BUN: 11 mg/dL (ref 8–23)
CO2: 26 mmol/L (ref 22–32)
Calcium: 9.4 mg/dL (ref 8.9–10.3)
Chloride: 102 mmol/L (ref 98–111)
Creatinine, Ser: 0.78 mg/dL (ref 0.44–1.00)
GFR, Estimated: >60 mL/min (ref >60)
Glucose, Bld: 131 mg/dL — ABNORMAL HIGH (ref 70–99)
Potassium: 4.7 mmol/L (ref 3.5–5.1)
Sodium: 137 mmol/L (ref 135–145)
Total Bilirubin: 0.6 mg/dL (ref 0.3–1.2)
Total Protein: 7 g/dL (ref 6.5–8.1)

## 2020-05-14 LAB — CBC WITH DIFFERENTIAL/PLATELET
Abs Immature Granulocytes: 0.01 10*3/uL (ref 0.00–0.07)
Basophils Absolute: 0 10*3/uL (ref 0.0–0.1)
Basophils Relative: 1 %
Eosinophils Absolute: 0.2 10*3/uL (ref 0.0–0.5)
Eosinophils Relative: 4 %
HCT: 37.3 % (ref 36.0–46.0)
Hemoglobin: 11.8 g/dL — ABNORMAL LOW (ref 12.0–15.0)
Immature Granulocytes: 0 %
Lymphocytes Relative: 15 %
Lymphs Abs: 0.6 10*3/uL — ABNORMAL LOW (ref 0.7–4.0)
MCH: 26.8 pg (ref 26.0–34.0)
MCHC: 31.6 g/dL (ref 30.0–36.0)
MCV: 84.6 fL (ref 80.0–100.0)
Monocytes Absolute: 0.4 10*3/uL (ref 0.1–1.0)
Monocytes Relative: 10 %
Neutro Abs: 2.9 10*3/uL (ref 1.7–7.7)
Neutrophils Relative %: 70 %
Platelets: 222 10*3/uL (ref 150–400)
RBC: 4.41 MIL/uL (ref 3.87–5.11)
RDW: 14.3 % (ref 11.5–15.5)
WBC: 4.2 10*3/uL (ref 4.0–10.5)
nRBC: 0 % (ref 0.0–0.2)

## 2020-05-14 MED ORDER — TRIAMCINOLONE ACETONIDE 0.5 % EX OINT: 1.0000 "application " | TOPICAL_OINTMENT | Freq: Two times a day (BID) | CUTANEOUS | 0 refills | Status: AC

## 2020-05-14 MED ORDER — HYDROCODONE-ACETAMINOPHEN 10-325 MG PO TABS: 1.0000 | ORAL_TABLET | ORAL | 0 refills | Status: DC | PRN

## 2020-05-14 MED ORDER — MORPHINE SULFATE ER 15 MG PO TBCR: 15.0000 mg | EXTENDED_RELEASE_TABLET | Freq: Two times a day (BID) | ORAL | 0 refills | Status: DC

## 2020-05-14 MED ORDER — LIDOCAINE-PRILOCAINE 2.5-2.5 % EX CREA: 1.0000 "application " | TOPICAL_CREAM | CUTANEOUS | 0 refills | Status: AC | PRN

## 2020-05-14 NOTE — Assessment & Plan Note (Addendum)
#  Non-small cell lung cancer/adenocarcinoma-  left lung mass/invading the mediastinum; Stage IV [brain metastases]; QNS for NGS; GUARDANT-NEGATIVE.  NOV- PET scan- NO DISTANT METASTATIC DISEASE. PD-L1-95%.   # Currently on palliative radiation to left upper lobe mass- on 11/18 [6 weeks; until Jan 11th/Today].   We will plan to repeat a PET in 1 month from now.   #Seizure secondary to brain metastases/edema x2 [10 to 15 millimeters]; s/p SBRT 10/13; tapered off dexamethasone; MRI brain without contrast- improved.  # RT dermatitis- on aquaphor; recommend hydorcortisone prn.   #Back pain likely degenerative as no metastasis noted on recent PET scan; hydrocodone q 12 hour; MS contin 15 mg BID. STABLE.   # Solitary kidney-normal renal function- STABLE; MDR- colonization- s/p ID evaluation. Antibiotics not recommended.  # Nausea- ? Etiology from RT; on zofran prn.   # Poor IV access- recommend port placement; refer to IR.   # DISPOSITION: # referral to IR for port placement re: poor IV access.  # follow up in 1 month- MD; labs- cbc/cmp;PET scan prior--Dr.B  # I reviewed the blood work- with the patient in detail; also reviewed the imaging independently [as summarized above]; and with the patient in detail.

## 2020-05-14 NOTE — Progress Notes (Signed)
[place]  [phone] [phone]   Name: Kimberly Huff Date: 05/14/2020 MRN: [id number]  DOB: 09-Jul-1945  Patient Care Team: [name], MD as PCP - General (Internal Medicine) [name], MD (General Surgery) [name], MD (Internal Medicine) Telford Nab, RN as Oncology Nurse Navigator [name], RPH-CPP (Pharmacist)    REASON FOR CONSULTATION: Kimberly Huff is a 75 y.o. 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  Patient was hospitalized 02/02/2020-02/05/2020 with weakness and slurred speech and was found to have 8 cm left lung mass in 2 brain lesions consistent with metastatic disease.  She has had pain and insomnia.  Palliative care was consulted to help address goals and manage ongoing symptoms.  SOCIAL HISTORY:     reports that she quit smoking about 21 years ago. Her smoking use included cigarettes. She has a 19.00 pack-year smoking history. She has never used smokeless tobacco. She reports previous alcohol use. She reports that she does not use drugs.  Patient is unmarried. She was at home alone. She has a daughter in [place], [place] and a son and [place], [place]. Patient retired from the [employer] and then later worked at [employer].   ADVANCE DIRECTIVES:  On file  CODE STATUS: DNR/DNI (DNR order signed on 03/18/20)  PAST MEDICAL HISTORY: Past Medical History:  Diagnosis Date  . Allergy   . Atrophic kidney    left  . Blood clot in vein 45 yrs ago   inabdoment after surgery  . Blood transfusion without reported diagnosis   . Cataract   . Complication of anesthesia   . Coronary artery disease   . Coronary atherosclerosis   . Diabetes mellitus   . Diverticulosis 2005    Dr Jamal Collin  . Dyspnea    with exertion  . External hemorrhoids without mention of complication 9794  . GERD (gastroesophageal reflux disease)   . H/O cardiac catheterization 2005  . Heart murmur 2012   found by Dr. Jamal Collin  . Hyperlipidemia   . Hypertension   . Kidney stone   . Lesion of bladder   . Myocardial infarction ([place]) 2000  . Neuromuscular disorder ([place])    "achy muscles"  . Obesity   . Osteoarthritis   . PONV (postoperative nausea and vomiting)   . Recurrent UTI   . Seizure ([place])   . Spinal stenosis of cervical region   . Steatohepatitis 05/30/10   Brunt Stage 3, non alcoholic cirrhosis of the liver  . Type II or unspecified type diabetes mellitus without mention of complication, uncontrolled    Patient does takes Metformin and recommended to stop for 48 hours.  Marland Kitchen Ulcer   . Unspecified essential hypertension 2014    PAST SURGICAL HISTORY:  Past Surgical History:  Procedure Laterality Date  . ABDOMINAL HYSTERECTOMY  1982   complete  . BREAST CYST ASPIRATION Right 1990's   benign  . BREAST MASS EXCISION     benign  . CARDIAC CATHETERIZATION  2000   cardiac stent  . cardiac stents  2000  . CATARACT EXTRACTION     both eyes  . COLONOSCOPY  2004, 2015   Dr. Jamal Collin, Dr. Olevia Perches  . CYSTOSCOPY W/ RETROGRADES Left 11/12/2015   Procedure: CYSTOSCOPY WITH RETROGRADE PYELOGRAM;  Surgeon: Candee Furbish  McKenzie, MD;  Location: [facility];  Service: Urology;  Laterality: Left;  . CYSTOSCOPY W/ URETERAL STENT PLACEMENT Left 11/12/2015   Procedure: CYSTOSCOPY WITH STENT REPLACEMENT;  Surgeon: Cleon Gustin, MD;  Location: [facility];  Service: Urology;  Laterality: Left;  . CYSTOSCOPY WITH BIOPSY N/A 09/26/2019   Procedure: CYSTOSCOPY WITH BIOPSY AND FULGURATION;  Surgeon: Cleon Gustin, MD;  Location: [facility];  Service: Urology;  Laterality: N/A;  . CYSTOSCOPY WITH URETEROSCOPY AND STENT PLACEMENT Left 10/15/2015   Procedure: CYSTOSCOPY WITH URETEROSCOPY  AND STENT PLACEMENT;  Surgeon: Cleon Gustin, MD;  Location: [facility];  Service: Urology;  Laterality: Left;  . EYE SURGERY Bilateral 2012   cataract  . FOOT SURGERY  2008  . PTCA  2000   stent x 1  . ROBOTIC ASSITED PARTIAL NEPHRECTOMY Left 06/02/2019   Procedure: XI ROBOTIC ASSITED NEPHRECTOMY;  Surgeon: Cleon Gustin, MD;  Location: [facility];  Service: Urology;  Laterality: Left;  . SALPINGOOPHORECTOMY    . TONSILLECTOMY    . UPPER GI ENDOSCOPY    . URETEROSCOPY WITH HOLMIUM LASER LITHOTRIPSY Left 11/12/2015   Procedure: URETEROSCOPY WITH HOLMIUM LASER LITHOTRIPSY;  Surgeon: Cleon Gustin, MD;  Location: [facility];  Service: Urology;  Laterality: Left;  . URETEROTOMY  1960/1969   x 2 during childhood  . VAGINAL HYSTERECTOMY    . VIDEO BRONCHOSCOPY WITH ENDOBRONCHIAL NAVIGATION N/A 02/21/2020   Procedure: VIDEO BRONCHOSCOPY WITH ENDOBRONCHIAL NAVIGATION;  Surgeon: Ottie Glazier, MD;  Location: [facility];  Service: Thoracic;  Laterality: N/A;  . VIDEO BRONCHOSCOPY WITH ENDOBRONCHIAL ULTRASOUND N/A 02/21/2020   Procedure: VIDEO BRONCHOSCOPY WITH ENDOBRONCHIAL ULTRASOUND;  Surgeon: Ottie Glazier, MD;  Location: [facility];  Service: Thoracic;  Laterality: N/A;    HEMATOLOGY/ONCOLOGY HISTORY:  Oncology History Overview Note  # OCT 2021-left upper lobe lung mass invading mediastinum; bronc/Bx-  Oct 20th [Dr.Aleskerov]; non-small cell carcinoma/adenocarcinoma.  Stage IV [brain metastases]; NGS-QNS; PDL-1- 95%.  #2021-PET scan left upper lobe mass; presacral question benign mass; no distant site disease  #Right frontal/temporal brain met x2 [1-1.60m]-seizure- on dex; Keppra; SBRT s/p Oct 13th, 2021  # 11/18-RT to LUL ca [? hemoptysis]  # DM on OHA; HTN; SOLITARY KIDNEY [frequent infections/ atrophic- s/p nephrectomy 2020]  # NGS/MOLECULAR TESTS:NGS-QNS; guardant testing-inconclusive; PDL-1- 95%.     # PALLIATIVE CARE EVALUATION:03/18/2020  # PAIN MANAGEMENT:    DIAGNOSIS: Lung cancer  STAGE:   IV      ;  GOALS: Palliative  CURRENT/MOST RECENT THERAPY : RT    Brain metastasis ([facility]  02/02/2020 Initial Diagnosis   Brain metastasis ([facility]   Primary cancer of left upper lobe of lung ([facility]  02/26/2020 Initial Diagnosis   Primary cancer of left upper lobe of lung ([facility]   05/14/2020 Cancer Staging   Staging form: Lung, AJCC 8th Edition - Clinical: Stage IVB (cT3, pM1c) - Signed by BCammie Sickle MD on 05/14/2020     ALLERGIES:  has No Known Allergies.  MEDICATIONS:  Current Outpatient Medications  Medication Sig Dispense Refill  . acetaminophen (TYLENOL) 500 MG tablet Take 500 mg by mouth every 6 (six) hours as needed.    . Continuous Blood Gluc Receiver (FREESTYLE LIBRE 14 DAY READER) DEVI Use to check BS 4 times daily 2 each 2  . Continuous Blood Gluc Sensor (FREESTYLE LIBRE 14 DAY SENSOR) MISC Use to check BS 4 times daily 2 each 2  . denosumab (PROLIA) 60 MG/ML SOSY injection Inject into the skin.    .Marland Kitchen  furosemide (LASIX) 40 MG tablet TAKE ONE TABLET EVERY DAY (Patient taking differently: Taking PRN, about every other day) 90 tablet 1  . HYDROcodone-acetaminophen (NORCO) 10-325 MG tablet TAKE ONE TABLET BY MOUTH EVERY 4 HOURS AS NEEDED (BREAKTHROUGH PAIN) 60 tablet 0  . Insulin Pen Needle 32G X 6 MM MISC 1 Stick by Does not apply route daily. 100 each 1  . LANTUS SOLOSTAR 100 UNIT/ML Solostar Pen INJECT 20 UNITS INTO THE SKIN DAILY AS DIRECTED (Patient taking differently: Inject 15 Units into the skin daily.) 15 mL PRN  . levETIRAcetam (KEPPRA) 500 MG tablet Take 1 tablet (500 mg total) by mouth 2 (two) times daily. 60 tablet 0  . lidocaine-prilocaine (EMLA) cream Apply 1 application topically as needed. 30-45 mins prior to port access. 30 g 0  . losartan (COZAAR) 50 MG tablet Take 1 tablet (50 mg total) by mouth daily. 90 tablet 3  . morphine (MS CONTIN) 15 MG 12 hr tablet Take 1 tablet (15 mg total) by mouth every 12 (twelve)  hours. 30 tablet 0  . naloxone (NARCAN) nasal spray 4 mg/0.1 mL 1 spray into nostril upon signs of opioid overdose. Call 911. May repeat once if no response within 2-3 minutes. 1 each 0  . omeprazole (PRILOSEC) 40 MG capsule TAKE 1 CAPSULE BY MOUTH ONCE DAILY 90 capsule 1  . ondansetron (ZOFRAN) 8 MG tablet One pill every 8 hours as needed for nausea/vomitting. 40 tablet 1  . ONETOUCH ULTRA test strip USE AS INSTRUCTED 100 each 12  . prochlorperazine (COMPAZINE) 10 MG tablet Take 1 tablet (10 mg total) by mouth every 6 (six) hours as needed for nausea or vomiting. 40 tablet 1  . rosuvastatin (CRESTOR) 10 MG tablet TAKE ONE TABLET EVERY DAY 90 tablet 1  . senna-docusate (SENOKOT-S) 8.6-50 MG tablet Take 2 tablets by mouth daily as needed for mild constipation. 30 tablet 1  . sucralfate (CARAFATE) 1 g tablet Take 0.5 tablets (0.5 g total) by mouth 3 (three) times daily. Dissolve in 3-4 tbsp warm water, swallow. 60 tablet 1  . Vitamin D, Ergocalciferol, (DRISDOL) 1.25 MG (50000 UNIT) CAPS capsule TAKE ONE CAPSULE EACH WEEK 4 capsule 11   No current facility-administered medications for this visit.    VITAL SIGNS: There were no vitals taken for this visit. There were no vitals filed for this visit.  Estimated body mass index is 30.96 kg/m as calculated from the following:   Height as of an earlier encounter on 05/14/20: _0  (1.6 m).   Weight as of an earlier encounter on 05/14/20: 174 lb 12.8 oz (79.3 kg).  LABS: CBC:    Component Value Date/Time   WBC 4.2 05/14/2020 0843   HGB 11.8 (L) 05/14/2020 0843   HCT 37.3 05/14/2020 0843   PLT 222 05/14/2020 0843   MCV 84.6 05/14/2020 0843   NEUTROABS 2.9 05/14/2020 0843   LYMPHSABS 0.6 (L) 05/14/2020 0843   MONOABS 0.4 05/14/2020 0843   EOSABS 0.2 05/14/2020 0843   BASOSABS 0.0 05/14/2020 0843   Comprehensive Metabolic Panel:    Component Value Date/Time   NA 137 05/14/2020 0843   NA 140 12/15/2012 0000   K 4.7 05/14/2020 0843   CL 102  05/14/2020 0843   CO2 26 05/14/2020 0843   BUN 11 05/14/2020 0843   BUN 22 (A) 12/15/2012 0000   CREATININE 0.78 05/14/2020 0843   CREATININE 0.85 12/23/2018 1428   GLUCOSE 131 (H) 05/14/2020 0843   CALCIUM 9.4 05/14/2020 0843  AST 17 05/14/2020 0843   ALT 9 05/14/2020 0843   ALKPHOS 50 05/14/2020 0843   BILITOT 0.6 05/14/2020 0843   PROT 7.0 05/14/2020 0843   ALBUMIN 3.3 (L) 05/14/2020 0843    RADIOGRAPHIC STUDIES: MR BRAIN WO CONTRAST  Result Date: 05/01/2020 CLINICAL DATA:  Metastatic lung cancer EXAM: MRI HEAD WITHOUT CONTRAST TECHNIQUE: Multiplanar, multiecho pulse sequences of the brain and surrounding structures were obtained without intravenous contrast. COMPARISON:  02/05/2020 FINDINGS: No contrast was administered. IV access could not be obtained and patient declined to continue. Brain: Significantly decreased lateral right frontal and posterior right temporal edema at the site previously identified enhancing lesions. There is no acute infarction or intracranial hemorrhage. No new mass or edema identified. There is no hydrocephalus or extra-axial fluid collection. Patchy foci of T2 hyperintensity in the supratentorial white nonspecific but may reflect stable chronic microvascular ischemic changes. Vascular: Major vessel flow voids at the skull base are preserved. Skull and upper cervical spine: Normal marrow signal is preserved. Sinuses/Orbits: Minor mucosal thickening. Bilateral lens replacements. Other: Sella is unremarkable.  Mastoid air cells are clear. IMPRESSION: Study performed without intravenous contrast. The previously identified metastases cannot be directly compared, but there has been a substantial decrease in associated edema suggesting positive response to therapy. Electronically Signed   By: Macy Mis M.D.   On: 05/01/2020 13:32    PERFORMANCE STATUS (ECOG) : 1 - Symptomatic but completely ambulatory  Review of Systems Unless otherwise noted, a complete  review of systems is negative.  Physical Exam General: NAD Pulmonary: Unlabored Extremities: no edema, no joint deformities Skin: Erythematous rash upper back Neurological: Weakness but otherwise nonfocal  IMPRESSION: Routine follow-up visit.  Overall, patient reports she is doing reasonably well.  She does endorse some breakthrough pain during the day and can tell that the MS Contin is "wearing off."  She reports breakthrough pain as being "moderate" in severity.  Breakthrough pain is responsive to Norco, which she says alleviates it completely.  We discussed adjustment of her pain regimen but daughter would prefer to leave medications at current dose and frequency.  We could consider increasing frequency of MS Contin to every 8 hour dosing, which might lessen her need for as needed Norco.  We also discussed option of keeping a pain log.  Patient reports her constipation is improved with as needed senna.  She is having frequent nausea throughout the day.  Nausea is improved with use of as needed ondansetron.  Patient is taking ondansetron on average 1 time per day.  I suggest that she increase ondansetron to 2 or 3 times daily and take it on a scheduled basis.    Patient endorses some difficulty swallowing.  Likely radiation effects.  Patient is also taking omeprazole and Carafate.  Monitor for improvement over the following weeks.  Could consider ST consult versus upper GI series if needed.  Patient has a pruritic erythematous rash upper back at the site of radiation.  We will give her an Rx for topical steroid.  PDMP reviewed  PLAN: -Continue current scope of treatment -Continue MS Contin 15 mg every 12 hours -Continue Norco as needed for breakthrough pain -Daily bowel regimen -Triamcinolone cream twice daily as needed -RTC 1 month  Case and plan discussed with Dr. Rogue Bussing  Patient expressed understanding and was in agreement with this plan. She also understands that She can  call the clinic at any time with any questions, concerns, or complaints.     Time Total: 30  minutes  Visit consisted of counseling and education dealing with the complex and emotionally intense issues of symptom management and palliative care in the setting of serious and potentially life-threatening illness.Greater than 50%  of this time was spent counseling and coordinating care related to the above assessment and plan.  Signed by: Altha Harm, PhD, NP-C

## 2020-05-14 NOTE — Progress Notes (Signed)
Having really bad nausea that started 3 weeks ago. Want to talk about next steps in care. Rash from radiation. Discuss MRI. Wants to talk to [facility]. Feels like she staggering when walking.

## 2020-05-14 NOTE — Progress Notes (Signed)
[facility] CONSULT NOTE  Patient Care Team: [name], MD as PCP - General (Internal Medicine) [name], MD (General Surgery) [name], MD (Internal Medicine) Telford Nab, RN as Oncology Nurse Navigator [name], RPH-CPP (Pharmacist)  CHIEF COMPLAINTS/PURPOSE OF CONSULTATION:  Brain metastases  #  Oncology History Overview Note  # OCT 2021-left upper lobe lung mass invading mediastinum; bronc/Bx-  Oct 20th [Dr.Aleskerov]; non-small cell carcinoma/adenocarcinoma.  Stage IV [brain metastases]; NGS-QNS; PDL-1- 95%.  #2021-PET scan left upper lobe mass; presacral question benign mass; no distant site disease  #Right frontal/temporal brain met x2 [1-1.35m]-seizure- on dex; Keppra; SBRT s/p Oct 13th, 2021  # 11/18-RT to LUL ca [? hemoptysis] s/p RT [until jan 11th, 2022]  # DM on OHA; HTN; SOLITARY KIDNEY [frequent infections/ atrophic- s/p nephrectomy 2020]; colonization-multiple drug resistance [s/p ID- Dr.Ravishankar- DEC 2021]  # NGS/MOLECULAR TESTS:NGS-QNS; guardant testing-inconclusive; PDL-1- 95%.     # PALLIATIVE CARE EVALUATION:03/18/2020  # PAIN MANAGEMENT:   DIAGNOSIS: Lung cancer  STAGE:   IV      ;  GOALS: Palliative  CURRENT/MOST RECENT THERAPY : RT    Brain metastasis ([facility]  02/02/2020 Initial Diagnosis   Brain metastasis (H[facility])   [place] lobe of [place]  02/26/2020 Initial Diagnosis   [place] lobe of lung ([facility]   05/14/2020 Cancer Staging   Staging form: Lung, AJCC 8th Edition - Clinical: Stage IVB (cT3, pM1c) - Signed by BCammie Sickle MD on 05/14/2020    HISTORY OF PRESENTING ILLNESS:  [name]89y.o.  female  patient with a history of solitary kidney; diabetes obesity; prior history of smoking; in left upper lobe lung mass; brain metastasis status post SBRT is here for follow-up/review results of the brain MRI.   Patient underwent last  session of radiation to the lung this morning. She is currently off steroids. No swelling of the legs.  Continues to have fatigue. Complains of nausea needing to take Zofran prior to her meals. Otherwise no fevers or chills.  Patient had poor IV access which precluded contrast administration for the MRI brain. Patient was quite distressed after her multiple IV sticks.  Review of Systems  Constitutional: Positive for malaise/fatigue. Negative for chills, diaphoresis, fever and weight loss.  HENT: Negative for nosebleeds and sore throat.   Eyes: Negative for double vision.  Respiratory: Negative for cough, sputum production and wheezing.   Cardiovascular: Positive for leg swelling. Negative for chest pain, palpitations and orthopnea.  Gastrointestinal: Negative for abdominal pain, blood in stool, constipation, diarrhea, heartburn, melena, nausea and vomiting.  Genitourinary: Negative for dysuria, frequency and urgency.  Musculoskeletal: Negative for back pain and joint pain.  Skin: Negative.  Negative for itching and rash.  Neurological: Negative for dizziness, tingling, focal weakness, weakness and headaches.  Endo/Heme/Allergies: Does not bruise/bleed easily.  Psychiatric/Behavioral: Negative for depression. The patient is not nervous/anxious and does not have insomnia.      MEDICAL HISTORY:  Past Medical History:  Diagnosis Date  . Allergy   . Atrophic kidney    left  . Blood clot in vein 45 yrs ago   inabdoment after surgery  . Blood transfusion without reported diagnosis   . Cataract   . Complication of anesthesia   . Coronary artery disease   . Coronary atherosclerosis   . Diabetes mellitus   . Diverticulosis 2005   Dr SJamal Collin . Dyspnea    with  exertion  . External hemorrhoids without mention of complication 7412  . GERD (gastroesophageal reflux disease)   . H/O cardiac catheterization 2005  . Heart murmur 2012   found by Dr. Jamal Collin  . Hyperlipidemia   . Hypertension    . Kidney stone   . Lesion of bladder   . Myocardial infarction ([place]) 2000  . Neuromuscular disorder ([place])    "achy muscles"  . Obesity   . Osteoarthritis   . PONV (postoperative nausea and vomiting)   . Recurrent UTI   . Seizure ([place])   . Spinal stenosis of cervical region   . Steatohepatitis 05/30/10   Brunt Stage 3, non alcoholic cirrhosis of the liver  . Type II or unspecified type diabetes mellitus without mention of complication, uncontrolled    Patient does takes Metformin and recommended to stop for 48 hours.  Marland Kitchen Ulcer   . Unspecified essential hypertension 2014    SURGICAL HISTORY: Past Surgical History:  Procedure Laterality Date  . ABDOMINAL HYSTERECTOMY  1982   complete  . BREAST CYST ASPIRATION Right 1990's   benign  . BREAST MASS EXCISION     benign  . CARDIAC CATHETERIZATION  2000   cardiac stent  . cardiac stents  2000  . CATARACT EXTRACTION     both eyes  . COLONOSCOPY  2004, 2015   Dr. Jamal Collin, Dr. Olevia Perches  . CYSTOSCOPY W/ RETROGRADES Left 11/12/2015   Procedure: CYSTOSCOPY WITH RETROGRADE PYELOGRAM;  Surgeon: Cleon Gustin, MD;  Location: [facility];  Service: Urology;  Laterality: Left;  . CYSTOSCOPY W/ URETERAL STENT PLACEMENT Left 11/12/2015   Procedure: CYSTOSCOPY WITH STENT REPLACEMENT;  Surgeon: Cleon Gustin, MD;  Location: [facility];  Service: Urology;  Laterality: Left;  . CYSTOSCOPY WITH BIOPSY N/A 09/26/2019   Procedure: CYSTOSCOPY WITH BIOPSY AND FULGURATION;  Surgeon: Cleon Gustin, MD;  Location[facility];  Service: Urology;  Laterality: N/A;  . CYSTOSCOPY WITH URETEROSCOPY AND STENT PLACEMENT Left 10/15/2015   Procedure: CYSTOSCOPY WITH URETEROSCOPY AND STENT PLACEMENT;  Surgeon: Cleon Gustin, MD;  Location: [facility];  Service: Urology;  Laterality: Left;  . EYE SURGERY Bilateral 2012   cataract  . FOOT SURGERY  2008  . PTCA  2000   stent x 1  . ROBOTIC ASSITED PARTIAL NEPHRECTOMY Left 06/02/2019    Procedure: XI ROBOTIC ASSITED NEPHRECTOMY;  Surgeon: Cleon Gustin, MD;  Location: [facility];  Service: Urology;  Laterality: Left;  . SALPINGOOPHORECTOMY    . TONSILLECTOMY    . UPPER GI ENDOSCOPY    . URETEROSCOPY WITH HOLMIUM LASER LITHOTRIPSY Left 11/12/2015   Procedure: URETEROSCOPY WITH HOLMIUM LASER LITHOTRIPSY;  Surgeon: Cleon Gustin, MD;  Location: [facility];  Service: Urology;  Laterality: Left;  . URETEROTOMY  1960/1969   x 2 during childhood  . VAGINAL HYSTERECTOMY    . VIDEO BRONCHOSCOPY WITH ENDOBRONCHIAL NAVIGATION N/A 02/21/2020   Procedure: VIDEO BRONCHOSCOPY WITH ENDOBRONCHIAL NAVIGATION;  Surgeon: Ottie Glazier, MD;  Location: [facility];  Service: Thoracic;  Laterality: N/A;  . VIDEO BRONCHOSCOPY WITH ENDOBRONCHIAL ULTRASOUND N/A 02/21/2020   Procedure: VIDEO BRONCHOSCOPY WITH ENDOBRONCHIAL ULTRASOUND;  Surgeon: Ottie Glazier, MD;  Location: [facility];  Service: Thoracic;  Laterality: N/A;    SOCIAL HISTORY: Social History   Socioeconomic History  . Marital status: Single    Spouse name: Not on file  . Number of children: 2  . Years of education: Not on file  . Highest education level: Not on file  Occupational History  . 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 Smoking status: Former Smoker    Packs/day: 0.50    Years: 38.00    Pack years: 19.00    Types: Cigarettes    Quit date: 09/15/1998    Years since quitting: 21.6  . Smokeless tobacco: Never Used  Vaping Use  . Vaping Use: Never used  Substance and Sexual Activity  . Alcohol use: Not Currently  . Drug use: No  . Sexual activity: Not Currently  Other Topics Concern  . Not on file  Social History Narrative   Lives in [place]; daughter lives in [place]. Quit smoking may 14th 2000. No alcohol. Retired  From [employer] job.    Social Determinants of Health   Financial Resource Strain: Low Risk   . Difficulty of Paying Living Expenses: Not  hard at all  Food Insecurity: No Food Insecurity  . Worried About Charity fundraiser in the Last Year: Never true  . Ran Out of Food in the Last Year: Never true  Transportation Needs: No Transportation Needs  . Lack of Transportation (Medical): No  . Lack of Transportation (Non-Medical): No  Physical Activity: Not on file  Stress: Stress Concern Present  . Feeling of Stress : To some extent  Social Connections: Unknown  . Frequency of Communication with Friends and Family: More than three times a week  . Frequency of Social Gatherings with Friends and Family: More than three times a week  . Attends Religious Services: More than 4 times per year  . Active Member of Clubs or Organizations: Yes  . Attends Archivist Meetings: More than 4 times per year  . Marital Status: Not on file  Intimate Partner Violence: Not At Risk  . Fear of Current or Ex-Partner: No  . Emotionally Abused: No  . Physically Abused: No  . Sexually Abused: No    FAMILY HISTORY: Family History  Problem Relation Age of Onset  . Lung cancer Father   . Heart disease Father   . Diverticulosis Mother   . Diabetes Mother   . Stroke Mother 48  . Autoimmune disease Daughter   . Thyroid disease Daughter   . Thyroid disease Sister   . Stomach cancer Maternal Aunt   . Breast cancer Maternal Aunt   . Colon cancer Neg Hx   . Esophageal cancer Neg Hx   . Rectal cancer Neg Hx     ALLERGIES:  has No Known Allergies.  MEDICATIONS:  Current Outpatient Medications  Medication Sig Dispense Refill  . acetaminophen (TYLENOL) 500 MG tablet Take 500 mg by mouth every 6 (six) hours as needed.    . Continuous Blood Gluc Receiver (FREESTYLE LIBRE 14 DAY READER) DEVI Use to check BS 4 times daily 2 each 2  . Continuous Blood Gluc Sensor (FREESTYLE LIBRE 14 DAY SENSOR) MISC Use to check BS 4 times daily 2 each 2  . denosumab (PROLIA) 60 MG/ML SOSY injection Inject into the skin.    . furosemide (LASIX) 40 MG tablet  TAKE ONE TABLET EVERY DAY (Patient taking differently: Taking PRN, about every other day) 90 tablet 1  . Insulin Pen Needle 32G X 6 MM MISC 1 Stick by Does not apply route daily. 100 each 1  . LANTUS SOLOSTAR 100 UNIT/ML Solostar Pen INJECT 20 UNITS INTO THE SKIN DAILY AS DIRECTED (Patient taking differently: Inject 15 Units into the skin daily.) 15 mL PRN  .  levETIRAcetam (KEPPRA) 500 MG tablet Take 1 tablet (500 mg total) by mouth 2 (two) times daily. 60 tablet 0  . lidocaine-prilocaine (EMLA) cream Apply 1 application topically as needed. 30-45 mins prior to port access. 30 g 0  . losartan (COZAAR) 50 MG tablet Take 1 tablet (50 mg total) by mouth daily. 90 tablet 3  . naloxone (NARCAN) nasal spray 4 mg/0.1 mL 1 spray into nostril upon signs of opioid overdose. Call 911. May repeat once if no response within 2-3 minutes. 1 each 0  . omeprazole (PRILOSEC) 40 MG capsule TAKE 1 CAPSULE BY MOUTH ONCE DAILY 90 capsule 1  . ondansetron (ZOFRAN) 8 MG tablet One pill every 8 hours as needed for nausea/vomitting. 40 tablet 1  . ONETOUCH ULTRA test strip USE AS INSTRUCTED 100 each 12  . prochlorperazine (COMPAZINE) 10 MG tablet Take 1 tablet (10 mg total) by mouth every 6 (six) hours as needed for nausea or vomiting. 40 tablet 1  . rosuvastatin (CRESTOR) 10 MG tablet TAKE ONE TABLET EVERY DAY 90 tablet 1  . senna-docusate (SENOKOT-S) 8.6-50 MG tablet Take 2 tablets by mouth daily as needed for mild constipation. 30 tablet 1  . sucralfate (CARAFATE) 1 g tablet Take 0.5 tablets (0.5 g total) by mouth 3 (three) times daily. Dissolve in 3-4 tbsp warm water, swallow. 60 tablet 1  . Vitamin D, Ergocalciferol, (DRISDOL) 1.25 MG (50000 UNIT) CAPS capsule TAKE ONE CAPSULE EACH WEEK 4 capsule 11  . HYDROcodone-acetaminophen (NORCO) 10-325 MG tablet Take 1 tablet by mouth every 4 (four) hours as needed. 90 tablet 0  . morphine (MS CONTIN) 15 MG 12 hr tablet Take 1 tablet (15 mg total) by mouth every 12 (twelve) hours.  60 tablet 0  . triamcinolone ointment (KENALOG) 0.5 % Apply 1 application topically 2 (two) times daily. 30 g 0   No current facility-administered medications for this visit.      Marland Kitchen  PHYSICAL EXAMINATION: ECOG PERFORMANCE STATUS: 1 - Symptomatic but completely ambulatory  Vitals:   05/14/20 0923  BP: (!) 142/73  Pulse: 84  Resp: 16  Temp: 98.3 F (36.8 C)  SpO2: 97%   Filed Weights   05/14/20 0923  Weight: 174 lb 12.8 oz (79.3 kg)    Physical Exam Vitals and nursing note reviewed.  Constitutional:      Comments: Patient is alone.  She is walking independently.  HENT:     Head: Normocephalic and atraumatic.     Mouth/Throat:     Pharynx: No oropharyngeal exudate.  Eyes:     Pupils: Pupils are equal, round, and reactive to light.  Cardiovascular:     Rate and Rhythm: Normal rate and regular rhythm.  Pulmonary:     Effort: No respiratory distress.     Breath sounds: No wheezing.  Abdominal:     General: Bowel sounds are normal. There is no distension.     Palpations: Abdomen is soft. There is no mass.     Tenderness: There is no abdominal tenderness. There is no guarding or rebound.  Musculoskeletal:        General: No tenderness. Normal range of motion.     Cervical back: Normal range of motion and neck supple.  Skin:    General: Skin is warm.  Neurological:     Mental Status: She is alert and oriented to person, place, and time.  Psychiatric:        Mood and Affect: Affect normal.      LABORATORY  DATA:  I have reviewed the data as listed Lab Results  Component Value Date   WBC 4.2 05/14/2020   HGB 11.8 (L) 05/14/2020   HCT 37.3 05/14/2020   MCV 84.6 05/14/2020   PLT 222 05/14/2020   Recent Labs    06/03/19 0434 06/29/19 0819 02/02/20 0925 02/03/20 0541 02/09/20 1002 03/18/20 1509 03/26/20 0856 04/16/20 0938 05/14/20 0843  NA 136   < > 137 133*   < > 136 135 136 137  K 4.2   < > 4.2 4.3   < > 3.9 3.3* 4.0 4.7  CL 104   < > 100 99   < >  100 96* 101 102  CO2 26   < > 27 24   < > _0 GLUCOSE 125*   < > 177* 248*   < > 175* 188* 139* 131*  BUN 11   < > 16 18   < > _1 CREATININE 0.62   < > 0.81 0.78   < > 0.84 0.72 0.64 0.78  CALCIUM 9.0   < > 9.6 9.2   < > 9.1 8.4* 8.4* 9.4  GFRNONAA >60  --  >60 >60   < > >60 >60 >60 >60  GFRAA >60  --  >60 >60  --   --   --   --   --   PROT  --    < > 7.7  --    < > 6.5  --  6.5 7.0  ALBUMIN  --    < > 4.5  --    < > 3.4*  --  3.2* 3.3*  AST  --    < > 29  --    < > 14*  --  14* 17  ALT  --    < > 31  --    < > 19  --  9 9  ALKPHOS  --    < > 43  --    < > 31*  --  36* 50  BILITOT  --    < > 0.9  --    < > 0.8  --  0.6 0.6   < > = values in this interval not displayed.    RADIOGRAPHIC STUDIES: I have personally reviewed the radiological images as listed and agreed with the findings in the report. MR BRAIN WO CONTRAST  Result Date: 05/01/2020 CLINICAL DATA:  Metastatic lung cancer EXAM: MRI HEAD WITHOUT CONTRAST TECHNIQUE: Multiplanar, multiecho pulse sequences of the brain and surrounding structures were obtained without intravenous contrast. COMPARISON:  02/05/2020 FINDINGS: No contrast was administered. IV access could not be obtained and patient declined to continue. Brain: Significantly decreased lateral right frontal and posterior right temporal edema at the site previously identified enhancing lesions. There is no acute infarction or intracranial hemorrhage. No new mass or edema identified. There is no hydrocephalus or extra-axial fluid collection. Patchy foci of T2 hyperintensity in the supratentorial white nonspecific but may reflect stable chronic microvascular ischemic changes. Vascular: Major vessel flow voids at the skull base are preserved. Skull and upper cervical spine: Normal marrow signal is preserved. Sinuses/Orbits: Minor mucosal thickening. Bilateral lens replacements. Other: Sella is unremarkable.  Mastoid air cells are clear. IMPRESSION: Study performed  without intravenous contrast. The previously identified metastases cannot be directly compared, but there has been a substantial decrease in associated edema suggesting positive response to therapy. Electronically Signed   By: Malachi Carl  Patel M.D.   On: 05/01/2020 13:32    ASSESSMENT & PLAN:   Primary cancer of left upper lobe of lung ([place]) #Non-small cell lung cancer/adenocarcinoma-  left lung mass/invading the mediastinum; Stage IV [brain metastases]; QNS for NGS; GUARDANT-NEGATIVE.  NOV- PET scan- NO DISTANT METASTATIC DISEASE. PD-L1-95%.   # Currently on palliative radiation to left upper lobe mass- on 11/18 [6 weeks; until Jan 11th/Today].   We will plan to repeat a PET in 1 month from now.   #Seizure secondary to brain metastases/edema x2 [10 to 15 millimeters]; s/p SBRT 10/13; tapered off dexamethasone; MRI brain without contrast- improved.  # RT dermatitis- on aquaphor; recommend hydorcortisone prn.   #Back pain likely degenerative as no metastasis noted on recent PET scan; hydrocodone q 12 hour; MS contin 15 mg BID. STABLE.   # Solitary kidney-normal renal function- STABLE; MDR- colonization- s/p ID evaluation. Antibiotics not recommended.  # Nausea- ? Etiology from RT; on zofran prn.   # Poor IV access- recommend port placement; refer to IR.   # DISPOSITION: # referral to IR for port placement re: poor IV access.  # follow up in 1 month- MD; labs- cbc/cmp;PET scan prior--Dr.B  # I reviewed the blood work- with the patient in detail; also reviewed the imaging independently [as summarized above]; and with the patient in detail.    All questions were answered. The patient knows to call the clinic with any problems, questions or concerns.    [name], MD 05/15/2020 6:38 AM

## 2020-05-16 ENCOUNTER — Telehealth: Payer: Self-pay | Admitting: *Deleted

## 2020-05-16 ENCOUNTER — Telehealth: Payer: Self-pay | Admitting: Internal Medicine

## 2020-05-16 MED ORDER — ONDANSETRON HCL 8 MG PO TABS: ORAL_TABLET | ORAL | 1 refills | Status: DC

## 2020-05-16 NOTE — Telephone Encounter (Signed)
I spoke with patient's daughter by phone.  She had questions about scheduling the Zofran.  We will refill at her request.  Okay to schedule Zofran 3 times daily.  Refill sent for MS Contin/Norco earlier this week.  All questions answered.

## 2020-05-16 NOTE — Telephone Encounter (Signed)
Patient notes she had a sensor removed early for a scan. Wondered what to do.   Provided customer service phone number for [employer] to call and request a replacement.

## 2020-05-16 NOTE — Telephone Encounter (Signed)
[name] called requesting you call her to clarify patient medications as she is getting ready to go out of town for a few days. [phone]

## 2020-05-16 NOTE — Telephone Encounter (Signed)
Patient called about her Elenor Legato having problems and have other question to ask would like a call

## 2020-05-22 ENCOUNTER — Other Ambulatory Visit: Payer: Self-pay | Admitting: Student

## 2020-05-22 ENCOUNTER — Other Ambulatory Visit
Admission: RE | Admit: 2020-05-22 | Discharge: 2020-05-22 | Disposition: A | Discharge status: Home / Self Care | Payer: PPO | Source: Ambulatory Visit | Attending: Internal Medicine | Admitting: Internal Medicine

## 2020-05-22 ENCOUNTER — Other Ambulatory Visit: Payer: Self-pay

## 2020-05-22 ENCOUNTER — Telehealth: Payer: Self-pay

## 2020-05-22 ENCOUNTER — Telehealth: Payer: Self-pay | Admitting: *Deleted

## 2020-05-22 DIAGNOSIS — Z01812 Encounter for preprocedural laboratory examination: Secondary | ICD-10-CM | POA: Diagnosis not present

## 2020-05-22 DIAGNOSIS — Z20822 Contact with and (suspected) exposure to covid-19: Secondary | ICD-10-CM | POA: Insufficient documentation

## 2020-05-22 LAB — SARS CORONAVIRUS 2 (TAT 6-24 HRS): SARS Coronavirus 2: NEGATIVE

## 2020-05-22 MED ORDER — COLCHICINE 0.6 MG PO TABS: 0.6000 mg | ORAL_TABLET | Freq: Two times a day (BID) | ORAL | 0 refills | Status: DC

## 2020-05-22 NOTE — Chronic Care Management (AMB) (Signed)
  Care Management   Note  05/22/2020 Name: MARILENE VATH MRN: [id number] DOB: 1945-07-08  [name] is a 75 y.o. year old female who is a primary care patient of Derrel Nip, Aris Everts, MD and is actively engaged with the care management team. I reached out to [name] by phone today to assist with re-scheduling a follow up visit with the Pharmacist  Follow up plan: Unsuccessful telephone outreach attempt made. A HIPAA compliant phone message was left for the patient providing contact information and requesting a return call.  The care management team will reach out to the patient again over the next 2 days.  If patient returns call to provider office, please advise to call [facility]  at [address], [place], [place], [place] [address] Direct Dial: [phone] [email or web address] Website: [email or web address]

## 2020-05-22 NOTE — Telephone Encounter (Signed)
Thank you! I will forward to Dr. Derrel Nip.

## 2020-05-22 NOTE — Telephone Encounter (Signed)
Pt's daughter left message stating that pt is having flare up of gout in her foot. She has tried all the home remedies without relief. Asking for any recommendations to help.   Please advise.

## 2020-05-22 NOTE — Telephone Encounter (Signed)
Kimberly Huff- I would defer the treatment of her gout to Dr.Tullo.  Thanks GB

## 2020-05-22 NOTE — Telephone Encounter (Signed)
Colchicine twice daily sent tot [facility] for gout flare.  Stop rosuvastatin while taking colchicine  Ok to add tylenol 1000 mg twice daily for pain control during falre.

## 2020-05-22 NOTE — Telephone Encounter (Signed)
[id number]

## 2020-05-23 ENCOUNTER — Telehealth: Payer: PPO

## 2020-05-23 ENCOUNTER — Ambulatory Visit
Admission: RE | Admit: 2020-05-23 | Discharge: 2020-05-23 | Disposition: A | Discharge status: Home / Self Care | Payer: PPO | Source: Ambulatory Visit | Attending: Internal Medicine | Admitting: Internal Medicine

## 2020-05-23 DIAGNOSIS — Z794 Long term (current) use of insulin: Secondary | ICD-10-CM | POA: Insufficient documentation

## 2020-05-23 DIAGNOSIS — Z79899 Other long term (current) drug therapy: Secondary | ICD-10-CM | POA: Insufficient documentation

## 2020-05-23 DIAGNOSIS — Z87891 Personal history of nicotine dependence: Secondary | ICD-10-CM | POA: Diagnosis not present

## 2020-05-23 DIAGNOSIS — C7931 Secondary malignant neoplasm of brain: Secondary | ICD-10-CM | POA: Diagnosis not present

## 2020-05-23 DIAGNOSIS — C7802 Secondary malignant neoplasm of left lung: Secondary | ICD-10-CM | POA: Diagnosis not present

## 2020-05-23 DIAGNOSIS — C3412 Malignant neoplasm of upper lobe, left bronchus or lung: Secondary | ICD-10-CM

## 2020-05-23 DIAGNOSIS — Z452 Encounter for adjustment and management of vascular access device: Secondary | ICD-10-CM | POA: Diagnosis not present

## 2020-05-23 HISTORY — PX: IR IMAGING GUIDED PORT INSERTION: IMG5740

## 2020-05-23 LAB — GLUCOSE, CAPILLARY: Glucose-Capillary: 93 mg/dL (ref 70–99)

## 2020-05-23 MED ORDER — MIDAZOLAM HCL 2 MG/2ML IJ SOLN
INTRAMUSCULAR | Status: AC
  Filled 2020-05-23: qty 2

## 2020-05-23 MED ORDER — CEFAZOLIN SODIUM-DEXTROSE 1-4 GM/50ML-% IV SOLN
INTRAVENOUS | Status: AC | PRN
  Administered 2020-05-23: 2 g via INTRAVENOUS

## 2020-05-23 MED ORDER — FENTANYL CITRATE (PF) 100 MCG/2ML IJ SOLN
INTRAMUSCULAR | Status: AC | PRN
  Administered 2020-05-23 (×2): 50 ug via INTRAVENOUS

## 2020-05-23 MED ORDER — CEFAZOLIN SODIUM-DEXTROSE 2-4 GM/100ML-% IV SOLN: 2.0000 g | INTRAVENOUS | Status: DC

## 2020-05-23 MED ORDER — HEPARIN SOD (PORK) LOCK FLUSH 100 UNIT/ML IV SOLN
INTRAVENOUS | Status: AC
  Filled 2020-05-23: qty 5

## 2020-05-23 MED ORDER — FENTANYL CITRATE (PF) 100 MCG/2ML IJ SOLN
INTRAMUSCULAR | Status: AC
  Filled 2020-05-23: qty 2

## 2020-05-23 MED ORDER — MIDAZOLAM HCL 2 MG/2ML IJ SOLN
INTRAMUSCULAR | Status: AC | PRN
  Administered 2020-05-23 (×2): 1 mg via INTRAVENOUS

## 2020-05-23 MED ORDER — SODIUM CHLORIDE 0.9 % IV SOLN: INTRAVENOUS | Status: DC

## 2020-05-23 NOTE — H&P (Signed)
Chief Complaint: Patient was seen in consultation today for port placement at the request of Brahmanday,Govinda R  Referring Physician(s): Brahmanday,Govinda R  Patient Status: [facility] - Out-pt  History of Present Illness: Kimberly Huff is a 75 y.o. 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 She has completed SBRT of the lung and brain and is scheduled to begin immunotherapy. Port placement has been requested.  Past Medical History:  Diagnosis Date  . Allergy   . Atrophic kidney    left  . Blood clot in vein 45 yrs ago   inabdoment after surgery  . Blood transfusion without reported diagnosis   . Cataract   . Complication of anesthesia   . Coronary artery disease   . Coronary atherosclerosis   . Diabetes mellitus   . Diverticulosis 2005   Dr Jamal Collin  . Dyspnea    with exertion  . External hemorrhoids without mention of complication 1093  . GERD (gastroesophageal reflux disease)   . H/O cardiac catheterization 2005  . Heart murmur 2012   found by Dr. Jamal Collin  . Hyperlipidemia   . Hypertension   . Kidney stone   . Lesion of bladder   . Myocardial infarction ([place]) 2000  . Neuromuscular disorder ([place])    "achy muscles"  . Obesity   . Osteoarthritis   . PONV (postoperative nausea and vomiting)   . Recurrent UTI   . Seizure ([place])   . Spinal stenosis of cervical region   . Steatohepatitis 05/30/10   Brunt Stage 3, non alcoholic cirrhosis of the liver  . Type II or unspecified type diabetes mellitus without mention of complication, uncontrolled    Patient does takes Metformin and recommended to stop for 48 hours.  Marland Kitchen Ulcer   . Unspecified essential hypertension 2014    Past Surgical History:  Procedure Laterality Date  . ABDOMINAL HYSTERECTOMY  1982   complete  . BREAST CYST ASPIRATION Right 1990's   benign  . BREAST MASS EXCISION     benign  . CARDIAC CATHETERIZATION  2000   cardiac stent  . cardiac stents  2000  .  CATARACT EXTRACTION     both eyes  . COLONOSCOPY  2004, 2015   Dr. Jamal Collin, Dr. Olevia Perches  . CYSTOSCOPY W/ RETROGRADES Left 11/12/2015   Procedure: CYSTOSCOPY WITH RETROGRADE PYELOGRAM;  Surgeon: Cleon Gustin, MD;  Location: [facility];  Service: Urology;  Laterality: Left;  . CYSTOSCOPY W/ URETERAL STENT PLACEMENT Left 11/12/2015   Procedure: CYSTOSCOPY WITH STENT REPLACEMENT;  Surgeon: Cleon Gustin, MD;  Location: [facility];  Service: Urology;  Laterality: Left;  . CYSTOSCOPY WITH BIOPSY N/A 09/26/2019   Procedure: CYSTOSCOPY WITH BIOPSY AND FULGURATION;  Surgeon: Cleon Gustin, MD;  [facility];  Service: Urology;  Laterality: N/A;  . CYSTOSCOPY WITH URETEROSCOPY AND STENT PLACEMENT Left 10/15/2015   Procedure: CYSTOSCOPY WITH URETEROSCOPY AND STENT PLACEMENT;  Surgeon: Cleon Gustin, MD;  Location: [facility];  Service: Urology;  Laterality: Left;  . EYE SURGERY Bilateral 2012   cataract  . FOOT SURGERY  2008  . PTCA  2000   stent x 1  . ROBOTIC ASSITED PARTIAL NEPHRECTOMY Left 06/02/2019   Procedure: XI ROBOTIC ASSITED NEPHRECTOMY;  Surgeon: Cleon Gustin, MD;  Location: [facility];  Service: Urology;  Laterality: Left;  . SALPINGOOPHORECTOMY    . TONSILLECTOMY    . UPPER GI ENDOSCOPY    . URETEROSCOPY WITH HOLMIUM LASER  LITHOTRIPSY Left 11/12/2015   Procedure: URETEROSCOPY WITH HOLMIUM LASER LITHOTRIPSY;  Surgeon: Cleon Gustin, MD;  Location: [facility];  Service: Urology;  Laterality: Left;  . URETEROTOMY  1960/1969   x 2 during childhood  . VAGINAL HYSTERECTOMY    . VIDEO BRONCHOSCOPY WITH ENDOBRONCHIAL NAVIGATION N/A 02/21/2020   Procedure: VIDEO BRONCHOSCOPY WITH ENDOBRONCHIAL NAVIGATION;  Surgeon: Ottie Glazier, MD;  Location: [facility];  Service: Thoracic;  Laterality: N/A;  . VIDEO BRONCHOSCOPY WITH ENDOBRONCHIAL ULTRASOUND N/A 02/21/2020   Procedure: VIDEO BRONCHOSCOPY WITH ENDOBRONCHIAL ULTRASOUND;  Surgeon: Ottie Glazier, MD;   Location: [facility];  Service: Thoracic;  Laterality: N/A;    Allergies: Patient has no known allergies.  Medications: Prior to Admission medications   Medication Sig Start Date End Date Taking? Authorizing Provider  acetaminophen (TYLENOL) 500 MG tablet Take 500 mg by mouth every 6 (six) hours as needed.   Yes [provider]  colchicine 0.6 MG tablet Take 1 tablet (0.6 mg total) by mouth 2 (two) times daily. 05/22/20  Yes [name], MD  Continuous Blood Gluc Receiver (FREESTYLE LIBRE 14 DAY READER) DEVI Use to check BS 4 times daily 03/19/20  Yes [name], MD  Continuous Blood Gluc Sensor (FREESTYLE LIBRE 14 DAY SENSOR) MISC Use to check BS 4 times daily 03/19/20  Yes [name], MD  denosumab (PROLIA) 60 MG/ML SOSY injection Inject into the skin.   Yes [provider]  furosemide (LASIX) 40 MG tablet TAKE ONE TABLET EVERY DAY Patient taking differently: Taking PRN, about every other day 04/12/20  Yes [name], MD  HYDROcodone-acetaminophen (NORCO) 10-325 MG tablet Take 1 tablet by mouth every 4 (four) hours as needed. 05/14/20  Yes Borders, [name], NP  Insulin Pen Needle 32G X 6 MM MISC 1 Stick by Does not apply route daily. 02/28/20  Yes [name], MD  LANTUS SOLOSTAR 100 UNIT/ML Solostar Pen INJECT 20 UNITS INTO THE SKIN DAILY AS DIRECTED Patient taking differently: Inject 15 Units into the skin daily. 02/27/20  Yes [name], MD  levETIRAcetam (KEPPRA) 500 MG tablet Take 1 tablet (500 mg total) by mouth 2 (two) times daily. 05/06/20  Yes Sindy Guadeloupe, MD  losartan (COZAAR) 50 MG tablet Take 1 tablet (50 mg total) by mouth daily. 04/22/20  Yes [name], MD  morphine (MS CONTIN) 15 MG 12 hr tablet Take 1 tablet (15 mg total) by mouth every 12 (twelve) hours. 05/14/20  Yes Borders, [name], NP  omeprazole (PRILOSEC) 40 MG capsule TAKE 1 CAPSULE BY MOUTH ONCE DAILY 04/12/20  Yes [name], MD  ondansetron (ZOFRAN) 8 MG  tablet One pill every 8 hours as needed for nausea/vomitting. 05/16/20  Yes Borders, [name], NP  [facility] ULTRA test strip USE AS INSTRUCTED 02/13/20  Yes [name], MD  prochlorperazine (COMPAZINE) 10 MG tablet Take 1 tablet (10 mg total) by mouth every 6 (six) hours as needed for nausea or vomiting. 03/18/20  Yes [name], MD  rosuvastatin (CRESTOR) 10 MG tablet TAKE ONE TABLET EVERY DAY 04/12/20  Yes [name], MD  senna-docusate (SENOKOT-S) 8.6-50 MG tablet Take 2 tablets by mouth daily as needed for mild constipation. 06/05/19 06/04/20 Yes McKenzie, Candee Furbish, MD  sucralfate (CARAFATE) 1 g tablet Take 0.5 tablets (0.5 g total) by mouth 3 (three) times daily. Dissolve in 3-4 tbsp warm water, swallow. 03/26/20  Yes Chrystal, Eulas Post, MD  triamcinolone ointment (KENALOG) 0.5 %  Apply 1 application topically 2 (two) times daily. 05/14/20  Yes Borders, [name], NP  Vitamin D, Ergocalciferol, (DRISDOL) 1.25 MG (50000 UNIT) CAPS capsule TAKE ONE CAPSULE EACH WEEK 08/24/19  Yes [name], MD  lidocaine-prilocaine (EMLA) cream Apply 1 application topically as needed. 30-45 mins prior to port access. 05/14/20   [name], MD  naloxone [facility]) nasal spray 4 mg/0.1 mL 1 spray into nostril upon signs of opioid overdose. Call 911. May repeat once if no response within 2-3 minutes. 03/25/20   Borders, [name], NP     Family History  Problem Relation Age of Onset  . Lung cancer Father   . Heart disease Father   . Diverticulosis Mother   . Diabetes Mother   . Stroke Mother 11  . Autoimmune disease Daughter   . Thyroid disease Daughter   . Thyroid disease Sister   . Stomach cancer Maternal Aunt   . Breast cancer Maternal Aunt   . Colon cancer Neg Hx   . Esophageal cancer Neg Hx   . Rectal cancer Neg Hx     Social History   Socioeconomic History  . Marital status: Single    Spouse name: Not on file  . Number of children: 2  . Years of education: Not on file   . Highest education level: Not on file  Occupational History  . 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 Smoking status: Former Smoker    Packs/day: 0.50    Years: 38.00    Pack years: 19.00    Types: Cigarettes    Quit date: 09/15/1998    Years since quitting: 21.7  . Smokeless tobacco: Never Used  Vaping Use  . Vaping Use: Never used  Substance and Sexual Activity  . Alcohol use: Not Currently  . Drug use: No  . Sexual activity: Not Currently  Other Topics Concern  . Not on file  Social History Narrative   Lives in [place]; daughter lives in [place]. Quit smoking may 14th 2000. No alcohol. Retired  From [employer] job.    Social Determinants of Health   Financial Resource Strain: Low Risk   . Difficulty of Paying Living Expenses: Not hard at all  Food Insecurity: No Food Insecurity  . Worried About Charity fundraiser in the Last Year: Never true  . Ran Out of Food in the Last Year: Never true  Transportation Needs: No Transportation Needs  . Lack of Transportation (Medical): No  . Lack of Transportation (Non-Medical): No  Physical Activity: Not on file  Stress: Stress Concern Present  . Feeling of Stress : To some extent  Social Connections: Unknown  . Frequency of Communication with Friends and Family: More than three times a week  . Frequency of Social Gatherings with Friends and Family: More than three times a week  . Attends Religious Services: More than 4 times per year  . Active Member of Clubs or Organizations: Yes  . Attends Archivist Meetings: More than 4 times per year  . Marital Status: Not on file    ECOG Status: 1 - Symptomatic but completely ambulatory  Review of Systems: A 12 point ROS discussed and pertinent positives are indicated in the HPI above.  All other systems are negative.  Review of Systems  Constitutional: Negative.   Respiratory: Negative.   Cardiovascular: Negative.    Gastrointestinal: Negative.   Genitourinary: Negative.   Musculoskeletal: Positive for  back pain.  Neurological: Negative.     Vital Signs: BP 136/72 (BP Location: Right Arm)   Pulse 66   Temp 97.8 F (36.6 C) (Oral)   Resp 20   Ht 5' 3"  (1.6 m)   Wt 79.4 kg   SpO2 95%   BMI 31.00 kg/m   Physical Exam Vitals reviewed.  Constitutional:      General: She is not in acute distress.    Appearance: She is not ill-appearing, toxic-appearing or diaphoretic.  HENT:     Head: Normocephalic and atraumatic.  Cardiovascular:     Rate and Rhythm: Normal rate and regular rhythm.     Heart sounds: Normal heart sounds. No murmur heard. No friction rub. No gallop.   Pulmonary:     Effort: Pulmonary effort is normal. No respiratory distress.     Breath sounds: Normal breath sounds. No stridor. No wheezing, rhonchi or rales.  Abdominal:     General: Bowel sounds are normal. There is no distension.     Palpations: Abdomen is soft. There is no mass.     Tenderness: There is no abdominal tenderness. There is no guarding or rebound.     Hernia: No hernia is present.  Musculoskeletal:        General: No swelling.     Cervical back: Neck supple. No rigidity or tenderness.  Lymphadenopathy:     Cervical: No cervical adenopathy.  Skin:    General: Skin is warm and dry.  Neurological:     General: No focal deficit present.     Mental Status: She is alert and oriented to person, place, and time.     Imaging: MR BRAIN WO CONTRAST  Result Date: 05/01/2020 CLINICAL DATA:  Metastatic lung cancer EXAM: MRI HEAD WITHOUT CONTRAST TECHNIQUE: Multiplanar, multiecho pulse sequences of the brain and surrounding structures were obtained without intravenous contrast. COMPARISON:  02/05/2020 FINDINGS: No contrast was administered. IV access could not be obtained and patient declined to continue. Brain: Significantly decreased lateral right frontal and posterior right temporal edema at the site previously  identified enhancing lesions. There is no acute infarction or intracranial hemorrhage. No new mass or edema identified. There is no hydrocephalus or extra-axial fluid collection. Patchy foci of T2 hyperintensity in the supratentorial white nonspecific but may reflect stable chronic microvascular ischemic changes. Vascular: Major vessel flow voids at the skull base are preserved. Skull and upper cervical spine: Normal marrow signal is preserved. Sinuses/Orbits: Minor mucosal thickening. Bilateral lens replacements. Other: Sella is unremarkable.  Mastoid air cells are clear. IMPRESSION: Study performed without intravenous contrast. The previously identified metastases cannot be directly compared, but there has been a substantial decrease in associated edema suggesting positive response to therapy. Electronically Signed   By: Macy Mis M.D.   On: 05/01/2020 13:32    Labs:  CBC: Recent Labs    03/18/20 1509 03/26/20 0856 04/16/20 0938 05/14/20 0843  WBC 7.5 9.1 6.3 4.2  HGB 12.0 12.0 11.7* 11.8*  HCT 36.7 37.3 38.0 37.3  PLT 158 262 214 222    COAGS: Recent Labs    02/02/20 0925 02/19/20 1047  INR 1.1 1.1  APTT 27 <24*    BMP: Recent Labs    05/30/19 0815 06/03/19 0434 06/29/19 0819 02/02/20 0925 02/03/20 0541 02/09/20 1002 03/18/20 1509 03/26/20 0856 04/16/20 0938 05/14/20 0843  NA 140 136   < > 137 133*   < > 136 135 136 137  K 4.9 4.2   < > 4.2  4.3   < > 3.9 3.3* 4.0 4.7  CL 105 104   < > 100 99   < > 100 96* 101 102  CO2 25 26   < > 27 24   < > 28 28 23 26   GLUCOSE 135* 125*   < > 177* 248*   < > 175* 188* 139* 131*  BUN 25* 11   < > 16 18   < > 12 8 8 11   CALCIUM 10.3 9.0   < > 9.6 9.2   < > 9.1 8.4* 8.4* 9.4  CREATININE 0.82 0.62   < > 0.81 0.78   < > 0.84 0.72 0.64 0.78  GFRNONAA >60 >60  --  >60 >60   < > >60 >60 >60 >60  GFRAA >60 >60  --  >60 >60  --   --   --   --   --    < > = values in this interval not displayed.    LIVER FUNCTION TESTS: Recent  Labs    03/04/20 1113 03/18/20 1509 04/16/20 0938 05/14/20 0843  BILITOT 1.1 0.8 0.6 0.6  AST 26 14* 14* 17  ALT 48* 19 9 9   ALKPHOS 33* 31* 36* 50  PROT 6.7 6.5 6.5 7.0  ALBUMIN 4.0 3.4* 3.2* 3.3*    TUMOR MARKERS: No results for input(s): AFPTM, CEA, CA199, CHROMGRNA in the last 8760 hours.  Assessment and Plan:  For port placement today for venous access and immunotherapy. Risks and benefits of image guided port-a-catheter placement was discussed with the patient including, but not limited to bleeding, infection, pneumothorax, or fibrin sheath development and need for additional procedures.  All of the patient's questions were answered, patient is agreeable to proceed. Consent signed and in chart.  Thank you for this interesting consult.  I greatly enjoyed meeting DEYONNA FITZSIMMONS and look forward to participating in their care.  A copy of this report was sent to the requesting provider on this date.  Electronically Signed: Azzie Roup, MD 05/23/2020, 2:21 PM     I spent a total of  30 Minutes in face to face in clinical consultation, greater than 50% of which was counseling/coordinating care for port placement.

## 2020-05-23 NOTE — Chronic Care Management (AMB) (Signed)
  Care Management   Note  05/23/2020 Name: Kimberly Huff MRN: [id number] DOB: Oct 08, 1945  Kimberly Huff is a 75 y.o. year old female who is a primary care patient of Derrel Nip, Aris Everts, MD and is actively engaged with the care management team. I reached out to Kimberly Huff by phone today to assist with re-scheduling a follow up visit with the Pharmacist  Follow up plan: Telephone appointment with care management team member scheduled for:06/06/2020  Noreene Larsson, [place], [place], [place] [address] Direct Dial: [phone] [email or web address] Website: [email or web address]

## 2020-05-23 NOTE — Telephone Encounter (Signed)
[id number]

## 2020-05-23 NOTE — Discharge Instructions (Signed)
Moderate Conscious Sedation, Adult, Care After This sheet gives you information about how to care for yourself after your procedure. Your health care provider may also give you more specific instructions. If you have problems or questions, contact your health care provider. What can I expect after the procedure? After the procedure, it is common to have:  Sleepiness for several hours.  Impaired judgment for several hours.  Difficulty with balance.  Vomiting if you eat too soon. Follow these instructions at home: For the time period you were told by your health care provider:  Rest.  Do not participate in activities where you could fall or become injured.  Do not drive or use machinery.  Do not drink alcohol.  Do not take sleeping pills or medicines that cause drowsiness.  Do not make important decisions or sign legal documents.  Do not take care of children on your own.      Eating and drinking  Follow the diet recommended by your health care provider.  Drink enough fluid to keep your urine pale yellow.  If you vomit: ? Drink water, juice, or soup when you can drink without vomiting. ? Make sure you have little or no nausea before eating solid foods.   General instructions  Take over-the-counter and prescription medicines only as told by your health care provider.  Have a responsible adult stay with you for the time you are told. It is important to have someone help care for you until you are awake and alert.  Do not smoke.  Keep all follow-up visits as told by your health care provider. This is important. Contact a health care provider if:  You are still sleepy or having trouble with balance after 24 hours.  You feel light-headed.  You keep feeling nauseous or you keep vomiting.  You develop a rash.  You have a fever.  You have redness or swelling around the IV site. Get help right away if:  You have trouble breathing.  You have new-onset confusion at  home. Summary  After the procedure, it is common to feel sleepy, have impaired judgment, or feel nauseous if you eat too soon.  Rest after you get home. Know the things you should not do after the procedure.  Follow the diet recommended by your health care provider and drink enough fluid to keep your urine pale yellow.  Get help right away if you have trouble breathing or new-onset confusion at home. This information is not intended to replace advice given to you by your health care provider. Make sure you discuss any questions you have with your health care provider. Document Revised: 08/18/2019 Document Reviewed: 03/16/2019 Elsevier Patient Education  2021 [place] Catheter Insertion, Care After This sheet gives you information about how to care for yourself after your procedure. Your health care provider may also give you more specific instructions. If you have problems or questions, contact your health care provider. What can I expect after the procedure? After the procedure, it is common to have:  Some mild redness, bruising, swelling, and pain around your catheter site.  A small amount of blood or clear fluid coming from your incisions. Follow these instructions at home: Incision care  Follow instructions from your health care provider about how to take care of your incisions. Make sure you: ? Wash your hands with soap and water before and after you change your bandages (dressings). If soap and water are not available, use hand sanitizer. ? Change your dressings  as told by your health care provider. Wash the area around your incisions with a germ-killing (antiseptic) solution when you change your dressings. ? Leave stitches (sutures), skin glue, or adhesive strips in place. These skin closures may need to stay in place for 2 weeks or longer. If adhesive strip edges start to loosen and curl up, you may trim the loose edges. Do not remove adhesive strips completely  unless your health care provider tells you to do that.  Keep your dressings clean and dry.  Check your incision areas every day for signs of infection. Check for: ? More redness, swelling, or pain. ? More fluid or blood. ? Warmth. ? Pus or a bad smell.   Catheter care  Wash your hands with soap and water before and after caring for your catheter. If soap and water are not available, use hand sanitizer.  Keep your catheter site clean and dry.  Apply an antibiotic ointment to your catheter site as told by your health care provider.  Flush your catheter as told by your health care provider. This helps prevent it from becoming clogged.  Do not open the caps on the ends of the catheter.  Do not pull on your catheter.   Medicines  Take over-the-counter and prescription medicines only as told by your health care provider.  If you were prescribed an antibiotic medicine, take it as told by your health care provider. Do not stop taking the antibiotic even if you start to feel better. Activity  Return to your normal activities as told by your health care provider. Ask your health care provider what activities are safe for you.  Follow any other activity restrictions as instructed by your health care provider.  Do not lift anything that is heavier than 10 lb (4.5 kg), or the limit that you are told, until your health care provider says that it is safe. Driving  Do not drive until your health care provider approves.  Ask your health care provider if the medicine prescribed to you requires you to avoid driving or using heavy machinery. General instructions  Follow your health care provider's specific instructions for the type of catheter that you have.  Do not take baths, swim, or use a hot tub until your health care provider approves. Ask your health care provider if you may take showers.  Keep all follow-up visits as told by your health care provider. This is important. Contact a  health care provider if:  You feel unusually weak or nauseous.  You have more redness, swelling, or pain at your incisions or around the area where your catheter is inserted.  Your catheter is not working properly.  You are unable to flush your catheter. Get help right away if:  Your catheter develops a hole or it breaks.  You have pain or swelling when fluids or medicines are being given through the catheter.  Fluid is leaking from the catheter, under the dressing, or around the dressing.  Your catheter comes loose or gets pulled completely out. If this happens, press on your catheter site firmly with a clean cloth until you can get medical help.  You have swelling in your shoulder, neck, chest, or face.  You have chest pain or difficulty breathing.  You feel dizzy or light-headed.  You have pus or a bad smell coming from your catheter site.  You have a fever or chills.  Your catheter site feels warm to the touch.  You develop bleeding from your catheter  or your insertion site, and your bleeding does not stop. Summary  After the procedure, it is common to have mild redness, swelling, and pain around your catheter site.  Return to your normal activities as told by your health care provider. Ask your health care provider what activities are safe for you.  Follow your health care provider's specific instructions for the type of catheter that you have.  Keep your catheter site and your dressings clean and dry.  Contact a health care provider if your catheter is not working properly. Get help right away if you have chest pain, fever, or difficulty breathing. This information is not intended to replace advice given to you by your health care provider. Make sure you discuss any questions you have with your health care provider. Document Revised: 04/12/2018 Document Reviewed: 04/12/2018 Elsevier Patient Education  2021 [place]. This sheet gives you information about how  to care for yourself after your procedure. Your health care provider may also give you more specific instructions. If you have problems or questions, contact your health care provider. What can I expect after the procedure? After the procedure, it is common to have:  Some mild redness, bruising, swelling, and pain around your catheter site.  A small amount of blood or clear fluid coming from your incisions. Follow these instructions at home: Incision care  Follow instructions from your health care provider about how to take care of your incisions. Make sure you: ? Wash your hands with soap and water before and after you change your bandages (dressings). If soap and water are not available, use hand sanitizer. ? Change your dressings as told by your health care provider. Wash the area around your incisions with a germ-killing (antiseptic) solution when you change your dressings. ? Leave stitches (sutures), skin glue, or adhesive strips in place. These skin closures may need to stay in place for 2 weeks or longer. If adhesive strip edges start to loosen and curl up, you may trim the loose edges. Do not remove adhesive strips completely unless your health care provider tells you to do that.  Keep your dressings clean and dry.  Check your incision areas every day for signs of infection. Check for: ? More redness, swelling, or pain. ? More fluid or blood. ? Warmth. ? Pus or a bad smell.   Catheter care  Wash your hands with soap and water before and after caring for your catheter. If soap and water are not available, use hand sanitizer.  Keep your catheter site clean and dry.  Apply an antibiotic ointment to your catheter site as told by your health care provider.  Flush your catheter as told by your health care provider. This helps prevent it from becoming clogged.  Do not open the caps on the ends of the catheter.  Do not pull on your catheter.   Medicines  Take over-the-counter and  prescription medicines only as told by your health care provider.  If you were prescribed an antibiotic medicine, take it as told by your health care provider. Do not stop taking the antibiotic even if you start to feel better. Activity  Return to your normal activities as told by your health care provider. Ask your health care provider what activities are safe for you.  Follow any other activity restrictions as instructed by your health care provider.  Do not lift anything that is heavier than 10 lb (4.5 kg), or the limit that you are told, until your health care  provider says that it is safe. Driving  Do not drive until your health care provider approves.  Ask your health care provider if the medicine prescribed to you requires you to avoid driving or using heavy machinery. General instructions  Follow your health care provider's specific instructions for the type of catheter that you have.  Do not take baths, swim, or use a hot tub until your health care provider approves. Ask your health care provider if you may take showers.  Keep all follow-up visits as told by your health care provider. This is important. Contact a health care provider if:  You feel unusually weak or nauseous.  You have more redness, swelling, or pain at your incisions or around the area where your catheter is inserted.  Your catheter is not working properly.  You are unable to flush your catheter. Get help right away if:  Your catheter develops a hole or it breaks.  You have pain or swelling when fluids or medicines are being given through the catheter.  Fluid is leaking from the catheter, under the dressing, or around the dressing.  Your catheter comes loose or gets pulled completely out. If this happens, press on your catheter site firmly with a clean cloth until you can get medical help.  You have swelling in your shoulder, neck, chest, or face.  You have chest pain or difficulty  breathing.  You feel dizzy or light-headed.  You have pus or a bad smell coming from your catheter site.  You have a fever or chills.  Your catheter site feels warm to the touch.  You develop bleeding from your catheter or your insertion site, and your bleeding does not stop. Summary  After the procedure, it is common to have mild redness, swelling, and pain around your catheter site.  Return to your normal activities as told by your health care provider. Ask your health care provider what activities are safe for you.  Follow your health care provider's specific instructions for the type of catheter that you have.  Keep your catheter site and your dressings clean and dry.  Contact a health care provider if your catheter is not working properly. Get help right away if you have chest pain, fever, or difficulty breathing. This information is not intended to replace advice given to you by your health care provider. Make sure you discuss any questions you have with your health care provider. Document Revised: 04/12/2018 Document Reviewed: 04/12/2018 Elsevier Patient Education  2021 [employer].

## 2020-05-23 NOTE — Procedures (Signed)
Interventional Radiology Procedure Note  Procedure: Single Lumen Power Port Placement    Access:  Right IJ vein.  Findings: Catheter tip positioned at SVC/RA junction. Port is ready for immediate use.   Complications: None  EBL: < 10 mL  Recommendations:  - Ok to shower in 24 hours - Do not submerge for 7 days - Routine line care   Juanisha Bautch T. Kathlene Cote, M.D Pager:  [phone]

## 2020-05-23 NOTE — Progress Notes (Signed)
Patient clinically stable post Port placement per DR Kathlene Cote, tolerated well. Awake/alert and oriented post procedure. Daughter Kimberly Huff to bedside with Dr Kathlene Cote out to speak with her. Vitals stable pre and post procedure. Denies complaints at this time. Report given to Fransico Michael Rn post procedure. Received Versed 2 mg along with Fentanyl 100 mcg IV for procedure.

## 2020-05-24 ENCOUNTER — Ambulatory Visit: Admission: RE | Admit: 2020-05-24 | Payer: PPO | Source: Ambulatory Visit

## 2020-05-27 NOTE — Telephone Encounter (Signed)
Attempted to call both daughter and pt several times with no return phone call.

## 2020-05-28 ENCOUNTER — Telehealth: Payer: Self-pay | Admitting: *Deleted

## 2020-05-28 MED ORDER — OXYCODONE HCL 5 MG PO TABS: 5.0000 mg | ORAL_TABLET | ORAL | 0 refills | Status: DC | PRN

## 2020-05-28 NOTE — Telephone Encounter (Signed)
I spoke with patient's daughter.  She says that patient's pain is well managed on Norco but patient is still having occasional "tumor fevers" for which she is taking extra acetaminophen.  No current fever or chills. Daughter would like to rotate to a new short acting pain medication that does not include acetaminophen.  Discussed options for MSIR or oxycodone.  Daughter would prefer trial of oxycodone.  Will send Rx.  Daughter says that patient is nearing completion of colchicine and asked when it would be safe to resume statin.  Recommend waiting at least a week following completion of cortisone.

## 2020-05-28 NOTE — Telephone Encounter (Signed)
Patient's daughter would like a call back regarding her medication management. She requested a call back from [facility] NP.

## 2020-05-29 ENCOUNTER — Telehealth: Payer: Self-pay | Admitting: *Deleted

## 2020-05-29 NOTE — Telephone Encounter (Signed)
[name] Brandt Key: [id number] - PA Case ID: [id number] - Rx #: [id number]  Pa submitted for oxycodone

## 2020-05-30 NOTE — Telephone Encounter (Signed)
Status: PA Response - Approved

## 2020-06-05 ENCOUNTER — Other Ambulatory Visit: Payer: Self-pay | Admitting: *Deleted

## 2020-06-05 MED ORDER — OXYCODONE HCL 5 MG PO TABS: 5.0000 mg | ORAL_TABLET | ORAL | 0 refills | Status: DC | PRN

## 2020-06-06 ENCOUNTER — Telehealth: Payer: Self-pay | Admitting: Internal Medicine

## 2020-06-06 ENCOUNTER — Ambulatory Visit (INDEPENDENT_AMBULATORY_CARE_PROVIDER_SITE_OTHER): Payer: PPO | Admitting: Pharmacist

## 2020-06-06 ENCOUNTER — Other Ambulatory Visit: Payer: Self-pay | Admitting: Oncology

## 2020-06-06 DIAGNOSIS — I10 Essential (primary) hypertension: Secondary | ICD-10-CM

## 2020-06-06 DIAGNOSIS — E1169 Type 2 diabetes mellitus with other specified complication: Secondary | ICD-10-CM | POA: Diagnosis not present

## 2020-06-06 DIAGNOSIS — E1121 Type 2 diabetes mellitus with diabetic nephropathy: Secondary | ICD-10-CM | POA: Diagnosis not present

## 2020-06-06 DIAGNOSIS — K7460 Unspecified cirrhosis of liver: Secondary | ICD-10-CM

## 2020-06-06 DIAGNOSIS — E785 Hyperlipidemia, unspecified: Secondary | ICD-10-CM

## 2020-06-06 MED ORDER — FREESTYLE LIBRE 14 DAY SENSOR MISC
3 refills | Status: AC
Start: 2020-06-06 — End: ?

## 2020-06-06 NOTE — Telephone Encounter (Signed)
Left message concerning due for Prolia injection on or after 06/29/20 okay to schedule.

## 2020-06-06 NOTE — Telephone Encounter (Signed)
Patient called in stated that she was sorry she missed your call could you please reschedule her

## 2020-06-06 NOTE — Patient Instructions (Signed)
Visit Information  PATIENT GOALS: Goals Addressed              This Visit's Progress     Patient Stated   .  Medication Monitoring (pt-stated)        Patient Goals/Self-Care Activities . Over the next 90 days, patient will:  - check blood glucose using CGM at least TID, document, and provide at future appointments        Patient verbalizes understanding of instructions provided today and agrees to view in [place].  Plan: Telephone follow up appointment with care management team member scheduled for:  ~ 6-8 weeks  Catie Darnelle Maffucci, PharmD, [place], [place] Clinical Pharmacist [employer] [facility] [employer] [phone]

## 2020-06-06 NOTE — Chronic Care Management (AMB) (Signed)
Chronic Care Management Pharmacy Note  06/06/2020 Name:  EDDY TERMINE MRN:  [id number] DOB:  August 16, 1945  Subjective: TASHAE INDA is an 75 y.o. year old female who is a primary patient of Derrel Nip, Aris Everts, MD.  The CCM team was consulted for assistance with disease management and care coordination needs.    Engaged with patient by telephone for follow up visit in response to provider referral for pharmacy case management and/or care coordination services.   Consent to Services:  The patient was given information about Chronic Care Management services, agreed to services, and gave verbal consent prior to initiation of services.  Please see initial visit note for detailed documentation.   Objective:  Lab Results  Component Value Date   CREATININE 0.78 05/14/2020   CREATININE 0.64 04/16/2020   CREATININE 0.72 03/26/2020    Lab Results  Component Value Date   HGBA1C 7.3 (H) 02/02/2020       Component Value Date/Time   CHOL 101 01/29/2020 0856   TRIG 201.0 (H) 01/29/2020 0856   HDL 25.20 (L) 01/29/2020 0856   CHOLHDL 4 01/29/2020 0856   VLDL 40.2 (H) 01/29/2020 0856   LDLCALC 38 10/04/2019 0802   LDLDIRECT 58.0 01/29/2020 0856   BP Readings from Last 3 Encounters:  05/23/20 130/73  05/14/20 (!) 142/73  04/25/20 120/78     Assessment: Review of patient past medical history, allergies, medications, health status, including review of consultants reports, laboratory and other test data, was performed as part of comprehensive evaluation and provision of chronic care management services.   SDOH:  (Social Determinants of Health) assessments and interventions performed:  SDOH Interventions   Flowsheet Row Most Recent Value  SDOH Interventions   Financial Strain Interventions Intervention Not Indicated  Stress Interventions Provide Counseling      CCM Care Plan  No Known Allergies  Medications Reviewed Today    Reviewed by [name], RPH-CPP (Pharmacist) on  06/06/20 at 1138  Med List Status: <None>  Medication Order Taking? Sig Documenting Provider Last Dose Status Informant  acetaminophen (TYLENOL) 500 MG tablet [id number] Yes Take 500 mg by mouth every 6 (six) hours as needed. [provider] Taking Active   colchicine 0.6 MG tablet [id number] Yes Take 1 tablet (0.6 mg total) by mouth 2 (two) times daily. [name], MD Taking Active   Continuous Blood Gluc Receiver (FREESTYLE LIBRE 14 DAY READER) [place] [id number] Yes Use to check BS 4 times daily [name], MD Taking Active   Continuous Blood Gluc Sensor (FREESTYLE LIBRE 14 DAY SENSOR) [place] [id number] Yes Use to check BS 4 times daily [name], MD Taking Active   denosumab (PROLIA) 60 MG/ML SOSY injection [id number] Yes Inject into the skin. [provider] Taking Active   furosemide (LASIX) 40 MG tablet [id number] Yes TAKE ONE TABLET EVERY DAY  Patient taking differently: Taking PRN, about every other day   [name], MD Taking Active   Insulin Pen Needle 32G X 6 MM MISC [id number] Yes 1 Stick by Does not apply route daily. [name], MD Taking Active   LANTUS SOLOSTAR 100 UNIT/ML Solostar Pen [id number] Yes INJECT 20 UNITS INTO THE SKIN DAILY AS DIRECTED  Patient taking differently: Inject 15 Units into the skin daily.   [name], MD Taking Active   levETIRAcetam (KEPPRA) 500 MG tablet [id number] Yes Take 1 tablet (500 mg total) by mouth 2 (two) times daily. Sindy Guadeloupe, MD Taking Active  lidocaine-prilocaine (EMLA) cream [id number]  Apply 1 application topically as needed. 30-45 mins prior to port access. [name], MD  Active   losartan (COZAAR) 50 MG tablet [id number] Yes Take 1 tablet (50 mg total) by mouth daily. [name], MD Taking Active   morphine (MS CONTIN) 15 MG 12 hr tablet [id number] Yes Take 1 tablet (15 mg total) by mouth every 12 (twelve) hours. Borders, [name], NP Taking Active   naloxone [facility]) nasal  spray 4 mg/0.1 mL [id number]  1 spray into nostril upon signs of opioid overdose. Call 911. May repeat once if no response within 2-3 minutes. Borders, [name], NP  Active   omeprazole (PRILOSEC) 40 MG capsule [id number] Yes TAKE 1 CAPSULE BY MOUTH ONCE DAILY [name], MD Taking Active   ondansetron (ZOFRAN) 8 MG tablet [id number] Yes One pill every 8 hours as needed for nausea/vomitting. Borders, [name], NP Taking Active   ONETOUCH ULTRA test strip [id number]  USE AS INSTRUCTED [name], MD  Active   oxyCODONE (OXY IR/ROXICODONE) 5 MG immediate release tablet [id number] Yes Take 1-2 tablets (5-10 mg total) by mouth every 4 (four) hours as needed for severe pain. Borders, [name], NP Taking Active   prochlorperazine (COMPAZINE) 10 MG tablet [id number] Yes Take 1 tablet (10 mg total) by mouth every 6 (six) hours as needed for nausea or vomiting. [name], MD Taking Active   rosuvastatin (CRESTOR) 10 MG tablet [id number] Yes TAKE ONE TABLET EVERY DAY [name], MD Taking Active   sucralfate (CARAFATE) 1 g tablet [id number] Yes Take 0.5 tablets (0.5 g total) by mouth 3 (three) times daily. Dissolve in 3-4 tbsp warm water, swallow. [name], MD Taking Active            Med Note Nat Christen Apr 18, 2020  3:46 PM) Taking PRN  triamcinolone ointment (KENALOG) 0.5 % [id number] Yes Apply 1 application topically 2 (two) times daily. Borders, [name], NP Taking Active   Vitamin D, Ergocalciferol, (DRISDOL) 1.25 MG (50000 UNIT) CAPS capsule [id number] Yes TAKE ONE CAPSULE EACH WEEK [name], MD Taking Active Self          Patient Active Problem List   Diagnosis Date Noted  . Aortic arch atherosclerosis ([place]) 03/19/2020  . Aortic aneurysm ([place]) 03/19/2020  . Primary cancer of left upper lobe of lung ([place]) 02/26/2020  . Aneurysm of cavernous portion of right internal carotid artery 02/03/2020  . Brain mass 02/02/2020  . Brain metastasis ([place])  02/02/2020  . Obesity (BMI 30.0-34.9)   . Back pain, chronic 01/22/2020  . OAB (overactive bladder) 12/13/2019  . Chronic cystitis 10/08/2019  . Pelvic mass in female 03/28/2019  . Thoracic spondylosis without myelopathy 03/28/2019  . Osteoporosis 11/12/2017  . Urge incontinence of urine 02/25/2016  . Hospital discharge follow-up 10/26/2015  . Benign essential HTN 10/25/2015  . Atrophic kidney 10/25/2015  . History of myocardial infarction 10/25/2015  . Microalbuminuria due to type 2 diabetes mellitus ([place]) 06/29/2015  . Personal history of gastric ulcer 02/23/2013  . Nontraumatic shoulder pain, right 02/23/2013  . Vitamin D deficiency 02/23/2013  . History of right humeral fracture  02/16/2012  . Gouty arthritis 08/25/2011  . Type 2 diabetes mellitus, controlled, with renal complications ([place]) 74/94/4967  . Hemorrhoid 05/27/2011  . Dyslipidemia associated with type 2 diabetes mellitus ([place]) 02/16/2011  . Cataract extraction status 02/16/2011  . Nonfunctioning left kidney 02/16/2011  .  Cirrhosis, non-alcoholic ([place]) 79/06/4095    Conditions to be addressed/monitored: HTN, HLD and DMII  Care Plan : Medication Management  Updates made by [name], RPH-CPP since 06/06/2020 12:00 AM    Problem: Diabetes, Cancer     Long-Range Goal: Disease State Management   Start Date: 03/19/2020  This Visit's Progress: On track  Recent Progress: On track  Priority: High  Note:   Current Barriers:  . Unable to achieve control of diabetes  . Recent complex diagnosis  Pharmacist Clinical Goal(s):  Marland Kitchen Over the next 90 days, patient will achieve control of diabetes as evidenced by improvement in A1c.  Interventions: . 1:1 collaboration with [name], MD regarding development and update of comprehensive plan of care as evidenced by provider attestation and co-signature . Inter-disciplinary care team collaboration (see longitudinal plan of care) . Comprehensive medication  review performed; medication list updated in electronic medical record  Diabetes: . Controlled per CGM download; current treatment: Lantus 15 units daily;  o Hx metformin, d/c d/t GI upset and concurrent radiation thearpy  . Notes that she has little appetite recently.  . Current glucose readings- using Libre 14 Day CGM Date of Download: 05/24/20-06/06/20 % Time CGM is active: 53% Average Glucose: 112 mg/dL Glucose Management Indicator: 6.0%  Glucose Variability: 17.6 (goal <36%) Time in Goal:  - Time in range 70-180: 100% - Time above range: 0% - Time below range: 0% . Praised for control of glucose. Continue current regimen at this time. Reviewed to target scanning at least Q8H to ensure the majority of glucose readings are captured.  . Discussed that if appetite picks up, we can have conversations about if prandial coverage is needed.   Hypertension: . Controlled per last clinic readings; current treatment: losartan 50 mg daily, furosemide 40 mg PRN . Recommended to continue current regimen  Hyperlipidemia: . Controlled per last lab work; current treatment: rosuvastatin 10 mg daily . Held while concurrent colchicine treatment per Dr. Derrel Nip  . Recommended to continue current treatment regimen  Lung Cancer, Symptom Management: . Seizure (brain metastasis): levetiracetam 500 mg BID . Nausea: ondansetron 4 mg PRN, procholorperazine PRN. Taking ondansetron ~2-3 times daily lately . Pain: morphine 15 mg BID, oxycodone 5-10 mg Q6H PRN, acetaminophen 325 mg PRN fever. Recently eliminated APAP content in opioid regimen d/t use of APAP for fever.  . Constipation: senna/docusate PRN. . Mood/sleep: trazodone 50 mg daily- not taking since starting on morphine  GERD: . Controlled per patient report. Current treatment: omeprazole 40 mg daily, sucralfate 0.5 g PRN acid related swallowing issues, though infrequent use.  Marland Kitchen Recommended to continue current regimen  Gout Flare: . Improved. S/p  treatment with colchicine 0.6 mg BID PRN . Continue to discuss additional non pharmacologic treatment. Monitor for frequency of gout flare. If becomes more frequent, consider checking uric acid and discuss benefit vs risk of ULT.   Patient Goals/Self-Care Activities . Over the next 90 days, patient will:  - check blood glucose using CGM at least TID, document, and provide at future appointments  Follow Up Plan: Telephone follow up appointment with care management team member scheduled for: ~7 weeks      Medication Assistance: None required.  Patient affirms current coverage meets needs.  Follow Up:  Patient agrees to Care Plan and Follow-up.  Plan: Telephone follow up appointment with care management team member scheduled for:  ~ 6-8 weeks  Catie Darnelle Maffucci, PharmD, [place], [place] Clinical Pharmacist [employer] [facility] [employer] [phone]

## 2020-06-10 ENCOUNTER — Other Ambulatory Visit: Payer: Self-pay | Admitting: Hospice and Palliative Medicine

## 2020-06-10 MED ORDER — OXYCODONE HCL 5 MG PO TABS: 5.0000 mg | ORAL_TABLET | ORAL | 0 refills | Status: DC | PRN

## 2020-06-10 NOTE — Progress Notes (Signed)
Received a call from patient's daughter.  Daughter reports that patient is more frequently using two oxycodone tablets and is found that her pain is better controlled.  Refill requested.  Will refill 67m tablets but consider increasing to 167mtablets at time of next refill if patient is consistently requiring two tablets per dose.

## 2020-06-11 ENCOUNTER — Other Ambulatory Visit: Payer: Self-pay | Admitting: *Deleted

## 2020-06-11 ENCOUNTER — Other Ambulatory Visit: Payer: Self-pay

## 2020-06-11 ENCOUNTER — Encounter
Admission: RE | Admit: 2020-06-11 | Discharge: 2020-06-11 | Disposition: A | Discharge status: Home / Self Care | Payer: PPO | Source: Ambulatory Visit | Attending: Internal Medicine | Admitting: Internal Medicine

## 2020-06-11 ENCOUNTER — Inpatient Hospital Stay: Payer: PPO | Attending: Internal Medicine

## 2020-06-11 DIAGNOSIS — C3412 Malignant neoplasm of upper lobe, left bronchus or lung: Secondary | ICD-10-CM | POA: Insufficient documentation

## 2020-06-11 DIAGNOSIS — M47816 Spondylosis without myelopathy or radiculopathy, lumbar region: Secondary | ICD-10-CM | POA: Diagnosis not present

## 2020-06-11 DIAGNOSIS — I517 Cardiomegaly: Secondary | ICD-10-CM | POA: Diagnosis not present

## 2020-06-11 DIAGNOSIS — Z95828 Presence of other vascular implants and grafts: Secondary | ICD-10-CM

## 2020-06-11 DIAGNOSIS — I2510 Atherosclerotic heart disease of native coronary artery without angina pectoris: Secondary | ICD-10-CM | POA: Diagnosis not present

## 2020-06-11 DIAGNOSIS — K5730 Diverticulosis of large intestine without perforation or abscess without bleeding: Secondary | ICD-10-CM | POA: Diagnosis not present

## 2020-06-11 DIAGNOSIS — Z87891 Personal history of nicotine dependence: Secondary | ICD-10-CM | POA: Diagnosis not present

## 2020-06-11 DIAGNOSIS — Z79899 Other long term (current) drug therapy: Secondary | ICD-10-CM | POA: Insufficient documentation

## 2020-06-11 DIAGNOSIS — J90 Pleural effusion, not elsewhere classified: Secondary | ICD-10-CM | POA: Diagnosis not present

## 2020-06-11 DIAGNOSIS — C7931 Secondary malignant neoplasm of brain: Secondary | ICD-10-CM | POA: Diagnosis not present

## 2020-06-11 DIAGNOSIS — E119 Type 2 diabetes mellitus without complications: Secondary | ICD-10-CM | POA: Insufficient documentation

## 2020-06-11 DIAGNOSIS — Z905 Acquired absence of kidney: Secondary | ICD-10-CM | POA: Diagnosis not present

## 2020-06-11 DIAGNOSIS — K429 Umbilical hernia without obstruction or gangrene: Secondary | ICD-10-CM | POA: Diagnosis not present

## 2020-06-11 DIAGNOSIS — I10 Essential (primary) hypertension: Secondary | ICD-10-CM | POA: Diagnosis not present

## 2020-06-11 DIAGNOSIS — C3490 Malignant neoplasm of unspecified part of unspecified bronchus or lung: Secondary | ICD-10-CM | POA: Diagnosis not present

## 2020-06-11 DIAGNOSIS — I700 Atherosclerosis of aorta: Secondary | ICD-10-CM | POA: Diagnosis not present

## 2020-06-11 DIAGNOSIS — I712 Thoracic aortic aneurysm, without rupture: Secondary | ICD-10-CM | POA: Insufficient documentation

## 2020-06-11 LAB — CBC WITH DIFFERENTIAL/PLATELET
Abs Immature Granulocytes: 0.02 10*3/uL (ref 0.00–0.07)
Basophils Absolute: 0 10*3/uL (ref 0.0–0.1)
Basophils Relative: 1 %
Eosinophils Absolute: 0.2 10*3/uL (ref 0.0–0.5)
Eosinophils Relative: 5 %
HCT: 35.7 % — ABNORMAL LOW (ref 36.0–46.0)
Hemoglobin: 11.2 g/dL — ABNORMAL LOW (ref 12.0–15.0)
Immature Granulocytes: 1 %
Lymphocytes Relative: 22 %
Lymphs Abs: 0.9 10*3/uL (ref 0.7–4.0)
MCH: 25.9 pg — ABNORMAL LOW (ref 26.0–34.0)
MCHC: 31.4 g/dL (ref 30.0–36.0)
MCV: 82.4 fL (ref 80.0–100.0)
Monocytes Absolute: 0.3 10*3/uL (ref 0.1–1.0)
Monocytes Relative: 8 %
Neutro Abs: 2.7 10*3/uL (ref 1.7–7.7)
Neutrophils Relative %: 63 %
Platelets: 194 10*3/uL (ref 150–400)
RBC: 4.33 MIL/uL (ref 3.87–5.11)
RDW: 13.7 % (ref 11.5–15.5)
WBC: 4.2 10*3/uL (ref 4.0–10.5)
nRBC: 0 % (ref 0.0–0.2)

## 2020-06-11 LAB — COMPREHENSIVE METABOLIC PANEL
ALT: 10 U/L (ref 0–44)
AST: 15 U/L (ref 15–41)
Albumin: 3.3 g/dL — ABNORMAL LOW (ref 3.5–5.0)
Alkaline Phosphatase: 63 U/L (ref 38–126)
Anion gap: 10 (ref 5–15)
BUN: 10 mg/dL (ref 8–23)
CO2: 25 mmol/L (ref 22–32)
Calcium: 9.1 mg/dL (ref 8.9–10.3)
Chloride: 102 mmol/L (ref 98–111)
Creatinine, Ser: 0.79 mg/dL (ref 0.44–1.00)
GFR, Estimated: >60 mL/min (ref >60)
Glucose, Bld: 130 mg/dL — ABNORMAL HIGH (ref 70–99)
Potassium: 4 mmol/L (ref 3.5–5.1)
Sodium: 137 mmol/L (ref 135–145)
Total Bilirubin: 0.5 mg/dL (ref 0.3–1.2)
Total Protein: 6.5 g/dL (ref 6.5–8.1)

## 2020-06-11 LAB — GLUCOSE, CAPILLARY: Glucose-Capillary: 110 mg/dL — ABNORMAL HIGH (ref 70–99)

## 2020-06-11 MED ORDER — FLUDEOXYGLUCOSE F - 18 (FDG) INJECTION
9.1000 | Freq: Once | INTRAVENOUS | Status: AC | PRN
  Administered 2020-06-11: 9.069 via INTRAVENOUS

## 2020-06-11 MED ORDER — HEPARIN SOD (PORK) LOCK FLUSH 100 UNIT/ML IV SOLN
500.0000 [IU] | Freq: Once | INTRAVENOUS | Status: AC
  Administered 2020-06-11: 500 [IU] via INTRAVENOUS
  Filled 2020-06-11: qty 5

## 2020-06-11 MED ORDER — SODIUM CHLORIDE 0.9% FLUSH
10.0000 mL | Freq: Once | INTRAVENOUS | Status: AC
  Administered 2020-06-11: 10 mL via INTRAVENOUS
  Filled 2020-06-11: qty 10

## 2020-06-14 ENCOUNTER — Ambulatory Visit: Payer: PPO | Admitting: Internal Medicine

## 2020-06-14 ENCOUNTER — Other Ambulatory Visit: Payer: PPO

## 2020-06-14 ENCOUNTER — Ambulatory Visit (INDEPENDENT_AMBULATORY_CARE_PROVIDER_SITE_OTHER): Payer: PPO

## 2020-06-14 VITALS — Ht 63.0 in | Wt 172.0 lb

## 2020-06-14 DIAGNOSIS — Z0000 Encounter for general adult medical examination without abnormal findings: Secondary | ICD-10-CM

## 2020-06-14 NOTE — Patient Instructions (Addendum)
Kimberly Huff , Thank you for taking time to come for your Medicare Wellness Visit. I appreciate your ongoing commitment to your health goals. Please review the following plan we discussed and let me know if I can assist you in the future.   These are the goals we discussed:  Goals: Follow up with primary care provider. Keep all routine scheduled appointments.    This is a list of the screening recommended for you and due dates:  Health Maintenance  Topic Date Due  . Eye exam for diabetics  10/02/2017  . Flu Shot  08/01/2020*  . Hemoglobin A1C  08/02/2020  . COVID-19 Vaccine (4 - Booster) 09/16/2020  . Complete foot exam   10/05/2020  . Tetanus Vaccine  02/23/2023  . DEXA scan (bone density measurement)  Completed  .  Hepatitis C: One time screening is recommended by Center for Disease Control  (CDC) for  adults born from [birth year] through 1967.   Completed  . Pneumonia vaccines  Completed  *Topic was postponed. The date shown is not the original due date.     Immunizations Immunization History  Administered Date(s) Administered  . Hepatitis B 02/16/2011  . Influenza, High Dose Seasonal PF 03/18/2018, 04/14/2019  . Influenza,inj,Quad PF,6+ Mos 02/22/2013, 03/07/2014  . Influenza-Unspecified 02/05/2015, 02/17/2016  . PFIZER(Purple Top)SARS-COV-2 Vaccination 05/09/2019, 05/30/2019  . Pneumococcal Conjugate-13 03/07/2014  . Pneumococcal Polysaccharide-23 03/16/2013, 07/12/2018  . Tdap 02/22/2013   Keep all routine maintenance appointments.   CCM 07/24/20 @ 10:30.  Advanced directives: on file  Conditions/risks identified: none new  Follow up in one year for your annual wellness visit    Preventive Care 65 Years and Older, Female Preventive care refers to lifestyle choices and visits with your health care provider that can promote health and wellness. What does preventive care include?  A yearly physical exam. This is also called an annual well check.  Dental exams once or  twice a year.  Routine eye exams. Ask your health care provider how often you should have your eyes checked.  Personal lifestyle choices, including:  Daily care of your teeth and gums.  Regular physical activity.  Eating a healthy diet.  Avoiding tobacco and drug use.  Limiting alcohol use.  Practicing safe sex.  Taking low-dose aspirin every day.  Taking vitamin and mineral supplements as recommended by your health care provider. What happens during an annual well check? The services and screenings done by your health care provider during your annual well check will depend on your age, overall health, lifestyle risk factors, and family history of disease. Counseling  Your health care provider may ask you questions about your:  Alcohol use.  Tobacco use.  Drug use.  Emotional well-being.  Home and relationship well-being.  Sexual activity.  Eating habits.  History of falls.  Memory and ability to understand (cognition).  Work and work Statistician.  Reproductive health. Screening  You may have the following tests or measurements:  Height, weight, and BMI.  Blood pressure.  Lipid and cholesterol levels. These may be checked every 5 years, or more frequently if you are over 9 years old.  Skin check.  Lung cancer screening. You may have this screening every year starting at age 29 if you have a 30-pack-year history of smoking and currently smoke or have quit within the past 15 years.  Fecal occult blood test (FOBT) of the stool. You may have this test every year starting at age 22.  Flexible sigmoidoscopy or colonoscopy. You  may have a sigmoidoscopy every 5 years or a colonoscopy every 10 years starting at age 46.  Hepatitis C blood test.  Hepatitis B blood test.  Sexually transmitted disease (STD) testing.  Diabetes screening. This is done by checking your blood sugar (glucose) after you have not eaten for a while (fasting). You may have this done  every 1-3 years.  Bone density scan. This is done to screen for osteoporosis. You may have this done starting at age 48.  Mammogram. This may be done every 1-2 years. Talk to your health care provider about how often you should have regular mammograms. Talk with your health care provider about your test results, treatment options, and if necessary, the need for more tests. Vaccines  Your health care provider may recommend certain vaccines, such as:  Influenza vaccine. This is recommended every year.  Tetanus, diphtheria, and acellular pertussis (Tdap, Td) vaccine. You may need a Td booster every 10 years.  Zoster vaccine. You may need this after age 57.  Pneumococcal 13-valent conjugate (PCV13) vaccine. One dose is recommended after age 33.  Pneumococcal polysaccharide (PPSV23) vaccine. One dose is recommended after age 32. Talk to your health care provider about which screenings and vaccines you need and how often you need them. This information is not intended to replace advice given to you by your health care provider. Make sure you discuss any questions you have with your health care provider. Document Released: 05/17/2015 Document Revised: 01/08/2016 Document Reviewed: 02/19/2015 Elsevier Interactive Patient Education  2017 [place] Prevention in the Home Falls can cause injuries. They can happen to people of all ages. There are many things you can do to make your home safe and to help prevent falls. What can I do on the outside of my home?  Regularly fix the edges of walkways and driveways and fix any cracks.  Remove anything that might make you trip as you walk through a door, such as a raised step or threshold.  Trim any bushes or trees on the path to your home.  Use bright outdoor lighting.  Clear any walking paths of anything that might make someone trip, such as rocks or tools.  Regularly check to see if handrails are loose or broken. Make sure that both  sides of any steps have handrails.  Any raised decks and porches should have guardrails on the edges.  Have any leaves, snow, or ice cleared regularly.  Use sand or salt on walking paths during winter.  Clean up any spills in your garage right away. This includes oil or grease spills. What can I do in the bathroom?  Use night lights.  Install grab bars by the toilet and in the tub and shower. Do not use towel bars as grab bars.  Use non-skid mats or decals in the tub or shower.  If you need to sit down in the shower, use a plastic, non-slip stool.  Keep the floor dry. Clean up any water that spills on the floor as soon as it happens.  Remove soap buildup in the tub or shower regularly.  Attach bath mats securely with double-sided non-slip rug tape.  Do not have throw rugs and other things on the floor that can make you trip. What can I do in the bedroom?  Use night lights.  Make sure that you have a light by your bed that is easy to reach.  Do not use any sheets or blankets that are too big for  your bed. They should not hang down onto the floor.  Have a firm chair that has side arms. You can use this for support while you get dressed.  Do not have throw rugs and other things on the floor that can make you trip. What can I do in the kitchen?  Clean up any spills right away.  Avoid walking on wet floors.  Keep items that you use a lot in easy-to-reach places.  If you need to reach something above you, use a strong step stool that has a grab bar.  Keep electrical cords out of the way.  Do not use floor polish or wax that makes floors slippery. If you must use wax, use non-skid floor wax.  Do not have throw rugs and other things on the floor that can make you trip. What can I do with my stairs?  Do not leave any items on the stairs.  Make sure that there are handrails on both sides of the stairs and use them. Fix handrails that are broken or loose. Make sure that  handrails are as long as the stairways.  Check any carpeting to make sure that it is firmly attached to the stairs. Fix any carpet that is loose or worn.  Avoid having throw rugs at the top or bottom of the stairs. If you do have throw rugs, attach them to the floor with carpet tape.  Make sure that you have a light switch at the top of the stairs and the bottom of the stairs. If you do not have them, ask someone to add them for you. What else can I do to help prevent falls?  Wear shoes that:  Do not have high heels.  Have rubber bottoms.  Are comfortable and fit you well.  Are closed at the toe. Do not wear sandals.  If you use a stepladder:  Make sure that it is fully opened. Do not climb a closed stepladder.  Make sure that both sides of the stepladder are locked into place.  Ask someone to hold it for you, if possible.  Clearly mark and make sure that you can see:  Any grab bars or handrails.  First and last steps.  Where the edge of each step is.  Use tools that help you move around (mobility aids) if they are needed. These include:  Canes.  Walkers.  Scooters.  Crutches.  Turn on the lights when you go into a dark area. Replace any light bulbs as soon as they burn out.  Set up your furniture so you have a clear path. Avoid moving your furniture around.  If any of your floors are uneven, fix them.  If there are any pets around you, be aware of where they are.  Review your medicines with your doctor. Some medicines can make you feel dizzy. This can increase your chance of falling. Ask your doctor what other things that you can do to help prevent falls. This information is not intended to replace advice given to you by your health care provider. Make sure you discuss any questions you have with your health care provider. Document Released: 02/14/2009 Document Revised: 09/26/2015 Document Reviewed: 05/25/2014 Elsevier Interactive Patient Education  2017  [employer].

## 2020-06-14 NOTE — Progress Notes (Addendum)
Subjective:   Kimberly Huff is a 75 y.o. female who presents for Medicare Annual (Subsequent) preventive examination.  Review of Systems    No ROS.  Medicare Wellness Virtual Visit.   Cardiac Risk Factors include: advanced age (>35mn, >>66women);diabetes mellitus;hypertension     Objective:    Today's Vitals   06/14/20 0904  Weight: 172 lb (78 kg)  Height: 5' 3"  (1.6 m)   Body mass index is 30.47 kg/m.  Advanced Directives 06/14/2020 04/16/2020 02/26/2020 02/21/2020 02/13/2020 02/03/2020 09/26/2019  Does Patient Have a Medical Advance Directive? Yes No;Yes Yes No No Yes No  Type of [place] - - - -  Does patient want to make changes to medical advance directive? No - Patient declined No - Patient declined - - - - -  Copy of [place] Chart? Yes - validated most recent copy scanned in chart (See row information) - - - - - -  Would patient like information on creating a medical advance directive? - - - No - Patient declined - - No - Patient declined    Current Medications (verified) Outpatient Encounter Medications as of 06/14/2020  Medication Sig   acetaminophen (TYLENOL) 500 MG tablet Take 500 mg by mouth every 6 (six) hours as needed.   colchicine 0.6 MG tablet Take 1 tablet (0.6 mg total) by mouth 2 (two) times daily.   Continuous Blood Gluc Receiver (FREESTYLE LIBRE 14 DAY READER) DEVI Use to check BS 4 times daily   Continuous Blood Gluc Sensor (FREESTYLE LIBRE 14 DAY SENSOR) MISC Use to check BS 4 times daily   denosumab (PROLIA) 60 MG/ML SOSY injection Inject into the skin.   furosemide (LASIX) 40 MG tablet TAKE ONE TABLET EVERY DAY (Patient taking differently: Taking PRN, about every other day)   Insulin Pen Needle 32G X 6 MM MISC 1 Stick by Does not apply route daily.   LANTUS SOLOSTAR 100 UNIT/ML Solostar Pen INJECT 20 UNITS INTO THE SKIN DAILY AS DIRECTED  (Patient taking differently: Inject 15 Units into the skin daily.)   levETIRAcetam (KEPPRA) 500 MG tablet TAKE 1 TABLET BY MOUTH 2 TIMES DAILY.   lidocaine-prilocaine (EMLA) cream Apply 1 application topically as needed. 30-45 mins prior to port access.   losartan (COZAAR) 50 MG tablet Take 1 tablet (50 mg total) by mouth daily.   morphine (MS CONTIN) 15 MG 12 hr tablet Take 1 tablet (15 mg total) by mouth every 12 (twelve) hours.   naloxone (NARCAN) nasal spray 4 mg/0.1 mL 1 spray into nostril upon signs of opioid overdose. Call 911. May repeat once if no response within 2-3 minutes.   omeprazole (PRILOSEC) 40 MG capsule TAKE 1 CAPSULE BY MOUTH ONCE DAILY   ondansetron (ZOFRAN) 8 MG tablet One pill every 8 hours as needed for nausea/vomitting.   ONETOUCH ULTRA test strip USE AS INSTRUCTED   oxyCODONE (OXY IR/ROXICODONE) 5 MG immediate release tablet Take 1-2 tablets (5-10 mg total) by mouth every 4 (four) hours as needed for severe pain.   prochlorperazine (COMPAZINE) 10 MG tablet Take 1 tablet (10 mg total) by mouth every 6 (six) hours as needed for nausea or vomiting.   rosuvastatin (CRESTOR) 10 MG tablet TAKE ONE TABLET EVERY DAY   sucralfate (CARAFATE) 1 g tablet Take 0.5 tablets (0.5 g total) by mouth 3 (three) times daily. Dissolve in 3-4 tbsp warm water, swallow.   triamcinolone ointment (  KENALOG) 0.5 % Apply 1 application topically 2 (two) times daily.   Vitamin D, Ergocalciferol, (DRISDOL) 1.25 MG (50000 UNIT) CAPS capsule TAKE ONE CAPSULE EACH WEEK   No facility-administered encounter medications on file as of 06/14/2020.    Allergies (verified) Patient has no known allergies.   History: Past Medical History:  Diagnosis Date   Allergy    Atrophic kidney    left   Blood clot in vein 45 yrs ago   inabdoment after surgery   Blood transfusion without reported diagnosis    Cataract    Complication of anesthesia    Coronary artery disease    Coronary atherosclerosis    Diabetes  mellitus    Diverticulosis 2005   Dr Jamal Collin   Dyspnea    with exertion   External hemorrhoids without mention of complication 8502   GERD (gastroesophageal reflux disease)    H/O cardiac catheterization 2005   Heart murmur 2012   found by Dr. Jamal Collin   Hyperlipidemia    Hypertension    Kidney stone    Lesion of bladder    Myocardial infarction [facility]) 2000   Neuromuscular disorder ([place])    "achy muscles"   Obesity    Osteoarthritis    PONV (postoperative nausea and vomiting)    Recurrent UTI    Seizure ([place])    Spinal stenosis of cervical region    Steatohepatitis 05/30/10   Brunt Stage 3, non alcoholic cirrhosis of the liver   Type II or unspecified type diabetes mellitus without mention of complication, uncontrolled    Patient does takes Metformin and recommended to stop for 48 hours.   Ulcer    Unspecified essential hypertension 2014   Past Surgical History:  Procedure Laterality Date   ABDOMINAL HYSTERECTOMY  1982   complete   BREAST CYST ASPIRATION Right 1990's   benign   BREAST MASS EXCISION     benign   CARDIAC CATHETERIZATION  2000   cardiac stent   cardiac stents  2000   CATARACT EXTRACTION     both eyes   COLONOSCOPY  2004, 2015   Dr. Jamal Collin, Dr. Olevia Perches   CYSTOSCOPY W/ RETROGRADES Left 11/12/2015   Procedure: CYSTOSCOPY WITH RETROGRADE PYELOGRAM;  Surgeon: Cleon Gustin, MD;  Location: [facility];  Service: Urology;  Laterality: Left;   CYSTOSCOPY W/ URETERAL STENT PLACEMENT Left 11/12/2015   Procedure: CYSTOSCOPY WITH STENT REPLACEMENT;  Surgeon: Cleon Gustin, MD;  Location: [facility];  Service: Urology;  Laterality: Left;   CYSTOSCOPY WITH BIOPSY N/A 09/26/2019   Procedure: CYSTOSCOPY WITH BIOPSY AND FULGURATION;  Surgeon: Cleon Gustin, MD;  Location[facility];  Service: Urology;  Laterality: N/A;   CYSTOSCOPY WITH URETEROSCOPY AND STENT PLACEMENT Left 10/15/2015   Procedure: CYSTOSCOPY WITH URETEROSCOPY AND STENT PLACEMENT;   Surgeon: Cleon Gustin, MD;  Location: [facility];  Service: Urology;  Laterality: Left;   EYE SURGERY Bilateral 2012   cataract   FOOT SURGERY  2008   IR IMAGING GUIDED PORT INSERTION  05/23/2020   PTCA  2000   stent x 1   ROBOTIC ASSITED PARTIAL NEPHRECTOMY Left 06/02/2019   Procedure: XI ROBOTIC ASSITED NEPHRECTOMY;  Surgeon: Cleon Gustin, MD;  Location: [facility];  Service: Urology;  Laterality: Left;   SALPINGOOPHORECTOMY     TONSILLECTOMY     UPPER GI ENDOSCOPY     URETEROSCOPY WITH HOLMIUM LASER LITHOTRIPSY Left 11/12/2015   Procedure: URETEROSCOPY WITH HOLMIUM LASER LITHOTRIPSY;  Surgeon: Candee Furbish  McKenzie, MD;  Location: [facility];  Service: Urology;  Laterality: Left;   URETEROTOMY  1960/1969   x 2 during childhood   VAGINAL HYSTERECTOMY     VIDEO BRONCHOSCOPY WITH ENDOBRONCHIAL NAVIGATION N/A 02/21/2020   Procedure: VIDEO BRONCHOSCOPY WITH ENDOBRONCHIAL NAVIGATION;  Surgeon: Ottie Glazier, MD;  Location: [facility];  Service: Thoracic;  Laterality: N/A;   VIDEO BRONCHOSCOPY WITH ENDOBRONCHIAL ULTRASOUND N/A 02/21/2020   Procedure: VIDEO BRONCHOSCOPY WITH ENDOBRONCHIAL ULTRASOUND;  Surgeon: Ottie Glazier, MD;  Location: [facility];  Service: Thoracic;  Laterality: N/A;   Family History  Problem Relation Age of Onset   Lung cancer Father    Heart disease Father    Diverticulosis Mother    Diabetes Mother    Stroke Mother 60   Autoimmune disease Daughter    Thyroid disease Daughter    Thyroid disease Sister    Stomach cancer Maternal Aunt    Breast cancer Maternal Aunt    Colon cancer Neg Hx    Esophageal cancer Neg Hx    Rectal cancer Neg Hx    Social History   Socioeconomic History   Marital status: Single    Spouse name: Not on file   Number of children: 2   Years of education: Not on file   Highest education level: Not on file  Occupational History   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 Quit smoking may 14th 2000. No alcohol. Retired  From [employer] job.    Social Determinants of Health   Financial Resource Strain: Low Risk    Difficulty of Paying Living Expenses: Not hard at all  Food Insecurity: No Food Insecurity   Worried About Charity fundraiser in the Last Year: Never true   [place] in the Last Year: Never true  Transportation Needs: No Transportation Needs   Lack of Transportation (Medical): No   Lack of Transportation (Non-Medical): No  Physical Activity: Not on file  Stress: No Stress Concern Present   Feeling of Stress : Only a little  Social Connections: Unknown   Frequency of Communication with Friends and Family: More than three times a week   Frequency of Social Gatherings with Friends and Family: More than three times a week   Attends Religious Services: More than 4 times per year   Active Member of [employer] or Organizations: Yes   Attends Music therapist: More than 4 times per year   Marital Status: Not on file    Tobacco Counseling Counseling given: Not Answered   Clinical Intake:  Pre-visit preparation completed: Yes        Diabetes: Yes (Followed by pcp)  How often do you need to have someone help you when you read instructions, pamphlets, or other written materials from your doctor or pharmacy?: 1 - Never  Nutrition Risk Assessment: Does the patient have any non-healing wounds?  No  Has the patient had any unintentional weight loss or weight gain?  No   Diabetes: How  often do you monitor your CBG's? 2-3 times.   Diabetes Management: Is the patient being seen by Chronic Care Management for management of their diabetes?  Yes   Diabetic Exams: Diabetic Eye Exam- plans to schedule  Interpreter Needed?: No      Activities of Daily Living In your present state of health, do you have any difficulty performing the following activities: 06/14/2020 05/23/2020  Hearing? N N  Vision? N N  Difficulty concentrating or making decisions? Y N  Walking or climbing stairs? N N  Dressing or bathing? N N  Doing errands, shopping? Y -  Comment She does not drive or run errands by herself. -  Preparing Food and eating ? N -  Using the Toilet? N -  In the past six months, have you accidently leaked urine? Y -  Comment Managed with daily brief -  Do you have problems with loss of bowel control? N -  Managing your Medications? Y -  Comment Daughter assist -  Managing your Finances? Y -  Comment Son manages -  Housekeeping or managing your Housekeeping? N -  Some recent data might be hidden    Patient Care Team: [name], MD as PCP - General (Internal Medicine) [name], MD (General Surgery) [name], MD (Internal Medicine) Telford Nab, RN as Oncology Nurse Navigator [name], RPH-CPP (Pharmacist)  Indicate any recent Medical Services you may have received from other than [facility] providers in the past year (date may be approximate).     Assessment:   This is a routine wellness examination for Kimberly Huff.  I connected with Kimberly Huff today by telephone and verified that I am speaking with the correct person using two identifiers. Location patient: home Location provider: work Persons participating in the virtual visit: patient, Marine scientist.    I discussed the limitations, risks, security and privacy concerns of performing an evaluation and management service by telephone and the availability of in person appointments.The patient  expressed understanding and verbally consented to this telephonic visit.    Interactive audio and video telecommunications were attempted between this provider and patient, however failed, due to patient having technical difficulties OR patient did not have access to video capability.  We continued and completed visit with audio only.  Some vital signs may be absent or patient reported.   Hearing/Vision screen  Hearing Screening   125Hz  250Hz  500Hz  1000Hz  2000Hz  3000Hz  4000Hz  6000Hz  8000Hz   Right ear:           Left ear:           Comments: Patient is able to hear conversational tones without difficulty. No issues reported.  Vision Screening Comments: Cataract extraction, bilateral  Visual acuity not assessed, virtual visit. They have seen their ophthalmologist.   Dietary issues and exercise activities discussed:    Healthy diet Good water intake  Goals: Follow up with primary care provider. Keep all routine scheduled appointments.   Depression Screen PHQ 2/9 Scores 06/14/2020 01/22/2020 06/14/2019 06/10/2018 03/14/2018 12/24/2016  PHQ - 2 Score 0 0 0 0 0 1  PHQ- 9 Score - - - - 3 8    Fall Risk Fall Risk  04/25/2020 03/08/2020 02/08/2020 01/22/2020 10/06/2019  Falls in the past year? 1 0 0 0 0  Number falls in past yr: 0 - - 0 -  Injury with Fall? 1 - -  0 -  Risk for fall due to : - - - - -  Follow up - Falls evaluation completed Falls evaluation completed Falls evaluation completed Falls evaluation completed    [place]: Handrails in use when climbing stairs?Yes Home free of loose throw rugs in walkways, pet beds, electrical cords, etc? Yes  Adequate lighting in your home to reduce risk of falls? Yes   ASSISTIVE DEVICES UTILIZED TO PREVENT FALLS: Life alert? No  Use of a cane, walker or w/c? Yes , walker as needed Grab bars in the bathroom? Yes  Shower chair or bench in shower? Yes  Elevated toilet seat ? Yes   TIMED UP AND GO: Was the test  performed? No . Virtual visit.  Cognitive Function: Patient is alert and oriented x3.   Enjoys plays word games, sends card, plays cards with friend.    6CIT Screen 06/14/2020 06/14/2019 06/10/2018  What Year? 0 points 0 points 0 points  What month? 0 points 0 points 0 points  What time? 0 points 0 points 0 points  Count back from 20 0 points - 0 points  Months in reverse 0 points - 0 points  Repeat phrase 0 points - 0 points  Total Score 0 - 0    Immunizations Immunization History  Administered Date(s) Administered   Hepatitis B 02/16/2011   Influenza, High Dose Seasonal PF 03/18/2018, 04/14/2019   Influenza,inj,Quad PF,6+ Mos 02/22/2013, 03/07/2014   Influenza-Unspecified 02/05/2015, 02/17/2016   Moderna Sars-Covid-2 Vaccination 03/19/2020   PFIZER(Purple Top)SARS-COV-2 Vaccination 05/09/2019, 05/30/2019   Pneumococcal Conjugate-13 03/07/2014   Pneumococcal Polysaccharide-23 03/16/2013, 07/12/2018   Tdap 02/22/2013    Health Maintenance Health Maintenance  Topic Date Due   OPHTHALMOLOGY EXAM  10/02/2017   INFLUENZA VACCINE  08/01/2020 (Originally 12/03/2019)   HEMOGLOBIN A1C  08/02/2020   COVID-19 Vaccine (4 - Booster) 09/16/2020   FOOT EXAM  10/05/2020   TETANUS/TDAP  02/23/2023   DEXA SCAN  Completed   Hepatitis C Screening  Completed   PNA vac Low Risk Adult  Completed   Colorectal cancer screening: No longer required.   Mammogram- 11/30/18.  CT Chest with contrast- completed 02/19/20.   Hepatitis C Screening: Completed 01/24/15.  Dental Screening: Recommended annual dental exams for proper oral hygiene.  Community Resource Referral / Chronic Care Management: CRR required this visit?  No   CCM required this visit?  No      Plan:   Keep all routine maintenance appointments.   CCM 07/24/20 @ 10:30.  I have personally reviewed and noted the following in the patient's chart:   Medical and social history Use of alcohol, tobacco or illicit drugs  Current  medications and supplements Functional ability and status Nutritional status Physical activity Advanced directives List of other physicians Hospitalizations, surgeries, and ER visits in previous 12 months Vitals Screenings to include cognitive, depression, and falls Referrals and appointments  In addition, I have reviewed and discussed with patient certain preventive protocols, quality metrics, and best practice recommendations. A written personalized care plan for preventive services as well as general preventive health recommendations were provided to patient via mychart.     OBrien-Blaney, Kimberly Ivancic Huff, Kimberly Huff   12/12/313    I have reviewed the above information and agree with above.   Deborra Medina, MD

## 2020-06-17 ENCOUNTER — Inpatient Hospital Stay (HOSPITAL_BASED_OUTPATIENT_CLINIC_OR_DEPARTMENT_OTHER): Payer: PPO | Admitting: Internal Medicine

## 2020-06-17 ENCOUNTER — Ambulatory Visit
Admission: RE | Admit: 2020-06-17 | Discharge: 2020-06-17 | Disposition: A | Discharge status: Home / Self Care | Payer: PPO | Source: Ambulatory Visit | Attending: Radiation Oncology | Admitting: Radiation Oncology

## 2020-06-17 ENCOUNTER — Inpatient Hospital Stay (HOSPITAL_BASED_OUTPATIENT_CLINIC_OR_DEPARTMENT_OTHER): Payer: PPO | Admitting: Hospice and Palliative Medicine

## 2020-06-17 ENCOUNTER — Other Ambulatory Visit: Payer: Self-pay

## 2020-06-17 DIAGNOSIS — C3412 Malignant neoplasm of upper lobe, left bronchus or lung: Secondary | ICD-10-CM

## 2020-06-17 DIAGNOSIS — C7931 Secondary malignant neoplasm of brain: Secondary | ICD-10-CM | POA: Diagnosis not present

## 2020-06-17 DIAGNOSIS — G893 Neoplasm related pain (acute) (chronic): Secondary | ICD-10-CM

## 2020-06-17 DIAGNOSIS — Z515 Encounter for palliative care: Secondary | ICD-10-CM

## 2020-06-17 DIAGNOSIS — C3492 Malignant neoplasm of unspecified part of left bronchus or lung: Secondary | ICD-10-CM

## 2020-06-17 MED ORDER — OXYCODONE HCL 10 MG PO TABS: 10.0000 mg | ORAL_TABLET | ORAL | 0 refills | Status: DC | PRN

## 2020-06-17 NOTE — Progress Notes (Signed)
[place] CONSULT NOTE  Patient Care Team: [name], MD as PCP - General (Internal Medicine) [name], MD (General Surgery) [name], MD (Internal Medicine) Telford Nab, RN as Oncology Nurse Navigator [name], RPH-CPP (Pharmacist)  CHIEF COMPLAINTS/PURPOSE OF CONSULTATION:  Brain metastases  #  Oncology History Overview Note  # OCT 2021-left upper lobe lung mass invading mediastinum; bronc/Bx-  Oct 20th [Dr.Aleskerov]; non-small cell carcinoma/adenocarcinoma.  Stage IV [brain metastases]; NGS-QNS; PDL-1- 95%.  #2021-PET scan left upper lobe mass; presacral question benign mass; no distant site disease  #Right frontal/temporal brain met x2 [1-1.6m]-seizure- on dex; Keppra; SBRT s/p Oct 13th, 2021  # 11/18-RT to LUL ca [? hemoptysis] s/p RT [until jan 11th, 2022]  # DM on OHA; HTN; SOLITARY KIDNEY [frequent infections/ atrophic- s/p nephrectomy 2020]; colonization-multiple drug resistance [s/p ID- Dr.Ravishankar- DEC 2021]  # NGS/MOLECULAR TESTS:NGS-QNS; guardant testing-inconclusive; PDL-1- 95%.     # PALLIATIVE CARE EVALUATION:03/18/2020  # PAIN MANAGEMENT:   DIAGNOSIS: Lung cancer  STAGE:   IV      ;  GOALS: Palliative  CURRENT/MOST RECENT THERAPY : RT    Brain metastasis ([facility]  02/02/2020 Initial Diagnosis   Brain metastasis (HCC)   Primary cancer of left upper lobe of lung ([facility]  02/26/2020 Initial Diagnosis   Primary cancer of left upper lobe of lung ([facility]   05/14/2020 Cancer Staging   Staging form: Lung, AJCC 8th Edition - Clinical: Stage IVB (cT3, pM1c) - Signed by BCammie Sickle MD on 05/14/2020    HISTORY OF PRESENTING ILLNESS:  Kimberly [id number].[id number].  female  patient with a history of solitary kidney; diabetes obesity; prior history of smoking; in left upper lobe lung mass; brain metastasis status post SBRT is here for follow-up/review results of the PET scan.  In the interim patient  underwent a port placement.  Uneventful.  Patient continues to have ongoing fatigue.  She continues to have fever up to 100 most of the days.  This improved on taking Tylenol.  No swelling in the legs.  No nausea no vomiting.  Notes to have gait instability.  Ongoing back pain.   Review of Systems  Constitutional: Positive for malaise/fatigue. Negative for chills, diaphoresis, fever and weight loss.  HENT: Negative for nosebleeds and sore throat.   Eyes: Negative for double vision.  Respiratory: Negative for cough, sputum production and wheezing.   Cardiovascular: Positive for leg swelling. Negative for chest pain, palpitations and orthopnea.  Gastrointestinal: Negative for abdominal pain, blood in stool, constipation, diarrhea, heartburn, melena, nausea and vomiting.  Genitourinary: Negative for dysuria, frequency and urgency.  Musculoskeletal: Negative for back pain and joint pain.  Skin: Negative.  Negative for itching and rash.  Neurological: Negative for dizziness, tingling, focal weakness, weakness and headaches.  Endo/Heme/Allergies: Does not bruise/bleed easily.  Psychiatric/Behavioral: Negative for depression. The patient is not nervous/anxious and does not have insomnia.      MEDICAL HISTORY:  Past Medical History:  Diagnosis Date  . Allergy   . Atrophic kidney    left  . Blood clot in vein 45 yrs ago   inabdoment after surgery  . Blood transfusion without reported diagnosis   . Cataract   . Complication of anesthesia   . Coronary artery disease   . Coronary atherosclerosis   . Diabetes mellitus   . Diverticulosis 2005   Dr SJamal Collin . Dyspnea    with exertion  . External hemorrhoids without mention of  complication 4193  . GERD (gastroesophageal reflux disease)   . H/O cardiac catheterization 2005  . Heart murmur 2012   found by Dr. Jamal Collin  . Hyperlipidemia   . Hypertension   . Kidney stone   . Lesion of bladder   . Myocardial infarction ([place]) 2000  .  Neuromuscular disorder ([place])    "achy muscles"  . Obesity   . Osteoarthritis   . PONV (postoperative nausea and vomiting)   . Recurrent UTI   . Seizure ([place])   . Spinal stenosis of cervical region   . Steatohepatitis 05/30/10   Brunt Stage 3, non alcoholic cirrhosis of the liver  . Type II or unspecified type diabetes mellitus without mention of complication, uncontrolled    Patient does takes Metformin and recommended to stop for 48 hours.  Marland Kitchen Ulcer   . Unspecified essential hypertension 2014    SURGICAL HISTORY: Past Surgical History:  Procedure Laterality Date  . ABDOMINAL HYSTERECTOMY  1982   complete  . BREAST CYST ASPIRATION Right 1990's   benign  . BREAST MASS EXCISION     benign  . CARDIAC CATHETERIZATION  2000   cardiac stent  . cardiac stents  2000  . CATARACT EXTRACTION     both eyes  . COLONOSCOPY  2004, 2015   Dr. Jamal Collin, Dr. Olevia Perches  . CYSTOSCOPY W/ RETROGRADES Left 11/12/2015   Procedure: CYSTOSCOPY WITH RETROGRADE PYELOGRAM;  Surgeon: Cleon Gustin, MD;  Location: [facility];  Service: Urology;  Laterality: Left;  . CYSTOSCOPY W/ URETERAL STENT PLACEMENT Left 11/12/2015   Procedure: CYSTOSCOPY WITH STENT REPLACEMENT;  Surgeon: Cleon Gustin, MD;  Location: [facility];  Service: Urology;  Laterality: Left;  . CYSTOSCOPY WITH BIOPSY N/A 09/26/2019   Procedure: CYSTOSCOPY WITH BIOPSY AND FULGURATION;  Surgeon: Cleon Gustin, MD;  Location[facility];  Service: Urology;  Laterality: N/A;  . CYSTOSCOPY WITH URETEROSCOPY AND STENT PLACEMENT Left 10/15/2015   Procedure: CYSTOSCOPY WITH URETEROSCOPY AND STENT PLACEMENT;  Surgeon: Cleon Gustin, MD;  Location: [facility];  Service: Urology;  Laterality: Left;  . EYE SURGERY Bilateral 2012   cataract  . FOOT SURGERY  2008  . IR IMAGING GUIDED PORT INSERTION  05/23/2020  . PTCA  2000   stent x 1  . ROBOTIC ASSITED PARTIAL NEPHRECTOMY Left 06/02/2019   Procedure: XI ROBOTIC ASSITED  NEPHRECTOMY;  Surgeon: Cleon Gustin, MD;  Location: [facility];  Service: Urology;  Laterality: Left;  . SALPINGOOPHORECTOMY    . TONSILLECTOMY    . UPPER GI ENDOSCOPY    . URETEROSCOPY WITH HOLMIUM LASER LITHOTRIPSY Left 11/12/2015   Procedure: URETEROSCOPY WITH HOLMIUM LASER LITHOTRIPSY;  Surgeon: Cleon Gustin, MD;  Location: [facility];  Service: Urology;  Laterality: Left;  . URETEROTOMY  1960/1969   x 2 during childhood  . VAGINAL HYSTERECTOMY    . VIDEO BRONCHOSCOPY WITH ENDOBRONCHIAL NAVIGATION N/A 02/21/2020   Procedure: VIDEO BRONCHOSCOPY WITH ENDOBRONCHIAL NAVIGATION;  Surgeon: Ottie Glazier, MD;  Location: [facility];  Service: Thoracic;  Laterality: N/A;  . VIDEO BRONCHOSCOPY WITH ENDOBRONCHIAL ULTRASOUND N/A 02/21/2020   Procedure: VIDEO BRONCHOSCOPY WITH ENDOBRONCHIAL ULTRASOUND;  Surgeon: Ottie Glazier, MD;  Location: [facility];  Service: Thoracic;  Laterality: N/A;    SOCIAL HISTORY: Social History   Socioeconomic History  . Marital status: Single    Spouse name: Not on file  . Number of children: 2  . Years of education: Not on file  . Highest education level: Not on  file  Occupational History  . 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 Smoking status: Former Smoker    Packs/day: 0.50    Years: 38.00    Pack years: 19.00    Types: Cigarettes    Quit date: 09/15/1998    Years since quitting: 21.7  . Smokeless tobacco: Never Used  Vaping Use  . Vaping Use: Never used  Substance and Sexual Activity  . Alcohol use: Not Currently  . Drug use: No  . Sexual activity: Not Currently  Other Topics Concern  . Not on file  Social History Narrative   Lives in [place]; daughter lives in [place]. Quit smoking may 14th 2000. No alcohol. Retired  From [employer] job.    Social Determinants of Health   Financial Resource Strain: Low Risk   . Difficulty of Paying Living Expenses: Not hard at all  Food Insecurity:  No Food Insecurity  . Worried About Charity fundraiser in the Last Year: Never true  . Ran Out of Food in the Last Year: Never true  Transportation Needs: No Transportation Needs  . Lack of Transportation (Medical): No  . Lack of Transportation (Non-Medical): No  Physical Activity: Not on file  Stress: No Stress Concern Present  . Feeling of Stress : Only a little  Social Connections: Unknown  . Frequency of Communication with Friends and Family: More than three times a week  . Frequency of Social Gatherings with Friends and Family: More than three times a week  . Attends Religious Services: More than 4 times per year  . Active Member of Clubs or Organizations: Yes  . Attends Archivist Meetings: More than 4 times per year  . Marital Status: Not on file  Intimate Partner Violence: Not At Risk  . Fear of Current or Ex-Partner: No  . Emotionally Abused: No  . Physically Abused: No  . Sexually Abused: No    FAMILY HISTORY: Family History  Problem Relation Age of Onset  . Lung cancer Father   . Heart disease Father   . Diverticulosis Mother   . Diabetes Mother   . Stroke Mother 1  . Autoimmune disease Daughter   . Thyroid disease Daughter   . Thyroid disease Sister   . Stomach cancer Maternal Aunt   . Breast cancer Maternal Aunt   . Colon cancer Neg Hx   . Esophageal cancer Neg Hx   . Rectal cancer Neg Hx     ALLERGIES:  has No Known Allergies.  MEDICATIONS:  Current Outpatient Medications  Medication Sig Dispense Refill  . acetaminophen (TYLENOL) 500 MG tablet Take 500 mg by mouth every 6 (six) hours as needed.    . colchicine 0.6 MG tablet Take 1 tablet (0.6 mg total) by mouth 2 (two) times daily. 20 tablet 0  . Continuous Blood Gluc Receiver (FREESTYLE LIBRE 14 DAY READER) DEVI Use to check BS 4 times daily 2 each 2  . Continuous Blood Gluc Sensor (FREESTYLE LIBRE 14 DAY SENSOR) MISC Use to check BS 4 times daily 6 each 3  . denosumab (PROLIA) 60 MG/ML  SOSY injection Inject into the skin.    . furosemide (LASIX) 40 MG tablet TAKE ONE TABLET EVERY DAY (Patient taking differently: Taking PRN, about every other day) 90 tablet 1  . Insulin Pen Needle 32G X 6 MM MISC 1 Stick by Does not apply route daily. 100 each 1  . LANTUS SOLOSTAR 100 UNIT/ML  Solostar Pen INJECT 20 UNITS INTO THE SKIN DAILY AS DIRECTED (Patient taking differently: Inject 15 Units into the skin daily.) 15 mL PRN  . levETIRAcetam (KEPPRA) 500 MG tablet TAKE 1 TABLET BY MOUTH 2 TIMES DAILY. 60 tablet 0  . lidocaine-prilocaine (EMLA) cream Apply 1 application topically as needed. 30-45 mins prior to port access. 30 g 0  . losartan (COZAAR) 50 MG tablet Take 1 tablet (50 mg total) by mouth daily. 90 tablet 3  . morphine (MS CONTIN) 15 MG 12 hr tablet Take 1 tablet (15 mg total) by mouth every 12 (twelve) hours. 60 tablet 0  . naloxone (NARCAN) nasal spray 4 mg/0.1 mL 1 spray into nostril upon signs of opioid overdose. Call 911. May repeat once if no response within 2-3 minutes. 1 each 0  . omeprazole (PRILOSEC) 40 MG capsule TAKE 1 CAPSULE BY MOUTH ONCE DAILY 90 capsule 1  . ondansetron (ZOFRAN) 8 MG tablet One pill every 8 hours as needed for nausea/vomitting. 90 tablet 1  . ONETOUCH ULTRA test strip USE AS INSTRUCTED 100 each 12  . Oxycodone HCl 10 MG TABS Take 1 tablet (10 mg total) by mouth every 4 (four) hours as needed. 90 tablet 0  . prochlorperazine (COMPAZINE) 10 MG tablet Take 1 tablet (10 mg total) by mouth every 6 (six) hours as needed for nausea or vomiting. 40 tablet 1  . rosuvastatin (CRESTOR) 10 MG tablet TAKE ONE TABLET EVERY DAY 90 tablet 1  . sucralfate (CARAFATE) 1 g tablet Take 0.5 tablets (0.5 g total) by mouth 3 (three) times daily. Dissolve in 3-4 tbsp warm water, swallow. 60 tablet 1  . triamcinolone ointment (KENALOG) 0.5 % Apply 1 application topically 2 (two) times daily. 30 g 0  . Vitamin D, Ergocalciferol, (DRISDOL) 1.25 MG (50000 UNIT) CAPS capsule TAKE  ONE CAPSULE EACH WEEK 4 capsule 11   No current facility-administered medications for this visit.      Marland Kitchen  PHYSICAL EXAMINATION: ECOG PERFORMANCE STATUS: 1 - Symptomatic but completely ambulatory  Vitals:   06/17/20 1036  BP: (!) 143/76  Pulse: 68  Resp: 18   Filed Weights   06/17/20 1036  Weight: 168 lb 12.8 oz (76.6 kg)    Physical Exam Vitals and nursing note reviewed.  Constitutional:      Comments: Patient came by daughter.  She is walking independently.  HENT:     Head: Normocephalic and atraumatic.     Mouth/Throat:     Pharynx: No oropharyngeal exudate.  Eyes:     Pupils: Pupils are equal, round, and reactive to light.  Cardiovascular:     Rate and Rhythm: Normal rate and regular rhythm.  Pulmonary:     Effort: No respiratory distress.     Breath sounds: No wheezing.     Comments: Decreased air entry bilaterally left more than right. Abdominal:     General: Bowel sounds are normal. There is no distension.     Palpations: Abdomen is soft. There is no mass.     Tenderness: There is no abdominal tenderness. There is no guarding or rebound.  Musculoskeletal:        General: No tenderness. Normal range of motion.     Cervical back: Normal range of motion and neck supple.  Skin:    General: Skin is warm.  Neurological:     Mental Status: She is alert and oriented to person, place, and time.  Psychiatric:        Mood and Affect:  Affect normal.      LABORATORY DATA:  I have reviewed the data as listed Lab Results  Component Value Date   WBC 4.2 06/11/2020   HGB 11.2 (L) 06/11/2020   HCT 35.7 (L) 06/11/2020   MCV 82.4 06/11/2020   PLT 194 06/11/2020   Recent Labs    02/02/20 0925 02/03/20 0541 02/09/20 1002 04/16/20 0938 05/14/20 0843 06/11/20 0946  NA 137 133*   < > 136 137 137  K 4.2 4.3   < > 4.0 4.7 4.0  CL 100 99   < > 101 102 102  CO2 27 24   < > 23 26 25   GLUCOSE 177* 248*   < > 139* 131* 130*  BUN 16 18   < > 8 11 10   CREATININE  0.81 0.78   < > 0.64 0.78 0.79  CALCIUM 9.6 9.2   < > 8.4* 9.4 9.1  GFRNONAA >60 >60   < > >60 >60 >60  GFRAA >60 >60  --   --   --   --   PROT 7.7  --    < > 6.5 7.0 6.5  ALBUMIN 4.5  --    < > 3.2* 3.3* 3.3*  AST 29  --    < > 14* 17 15  ALT 31  --    < > 9 9 10   ALKPHOS 43  --    < > 36* 50 63  BILITOT 0.9  --    < > 0.6 0.6 0.5   < > = values in this interval not displayed.    RADIOGRAPHIC STUDIES: I have personally reviewed the radiological images as listed and agreed with the findings in the report. NM PET Image Restag (PS) Skull Base To Thigh  Result Date: 06/12/2020 CLINICAL DATA:  Subsequent treatment strategy for non-small cell lung cancer. EXAM: NUCLEAR MEDICINE PET SKULL BASE TO THIGH TECHNIQUE: 9.1 mCi F-18 FDG was injected intravenously. Full-ring PET imaging was performed from the skull base to thigh after the radiotracer. CT data was obtained and used for attenuation correction and anatomic localization. Fasting blood glucose: 110 mg/dl COMPARISON:  Multiple exams, including of an/9/21 FINDINGS: Mediastinal blood pool activity: SUV max 2.7 Liver activity: SUV max NA NECK: No significant abnormal hypermetabolic activity in this region. Incidental CT findings: Bilateral common carotid atherosclerotic calcification. CHEST: The left upper lobe mass measures 5.7 by 4.2 cm on image 70 of series 3 (formerly 7.0 by 3.6 cm) with maximum SUV 5.6 (formerly 14.2). Central hypoenhancement in this mass compatible with suspected central necrosis. There is increasing ground-glass density in the left upper lobe and superior segment left lower lobe possibly result of radiation pneumonitis, correlate with any interval radiation therapy. There is also some distal airspace opacity in the left upper lobe along the periphery of the mass which could be from radiation pneumonitis or postobstructive pneumonitis. Clustered nodularity in the right upper lobe posteriorly on image 74 series 3 measuring 1.2 by 0.6  cm (previously 0.9 by 0.8 cm), maximum SUV 2.1 (previously 0.9). Incidental CT findings: Trace left pleural effusion. Coronary, aortic arch, and branch vessel atherosclerotic vascular disease. Right Port-A-Cath tip: SVC. Ascending thoracic aortic aneurysm at 4.1 cm. Mild cardiomegaly. Right paratracheal node 1.0 cm in short axis on image 83 series 3 (formerly 0.8 cm), maximum SUV 2.3 (formerly 1.8). ABDOMEN/PELVIS: No significant abnormal hypermetabolic activity in this region. Incidental CT findings: Aortoiliac atherosclerotic vascular disease. Prior left nephrectomy with trace fluid density along the  medial spleen unchanged from prior. Compensatory hypertrophy of the right kidney. Photopenic exophytic 2.2 by 3.2 cm lesion with internal density of 20 Hounsfield units from the right mid to lower kidney anteriorly. Small umbilical hernia contains trace fluid. Sigmoid colon diverticulosis. Presacral lesion with some fat density elements once again identified, measuring 4.5 by 3.4 cm, stable. This has low-grade metabolic activity with maximum SUV 3.0, formerly 2.7. Possible pelvic floor laxity. SKELETON: No significant abnormal hypermetabolic activity in this region. Incidental CT findings: Chronic deformity of the right proximal humerus from old healed fracture. Grade 1 degenerative anterolisthesis at L4-5 with grade 1 degenerative retrolisthesis at L1-2. Lumbar spondylosis and degenerative disc disease. IMPRESSION: 1. The left upper lobe mass is substantially reduced in activity, maximum SUV of 5.6 (formerly 14.2). Equivocal reduction in size. 2. Increased ground-glass density in the left upper lobe and superior segment left lower lobe, query interval radiation therapy. Increase airspace opacity distal to the left upper lobe mass, potentially from radiation pneumonitis or postobstructive pneumonitis. 3. The clustered nodularity in the right upper lobe is similar in size overall, but has mildly increased SUV compared  to the prior exam (currently 2.1, previously 0.9). Although I would tend to favor a benign etiology, surveillance is warranted. 4. There continues to be a presacral mass, with fat density component slightly less prominent than before but without significant change in size. Maximum SUV 3.0, formerly 2.7. I continued to favor this being a benign lesion such as myelolipoma or extramedullary hematopoiesis, surveillance to exclude liposarcoma is recommended. 5. Other imaging findings of potential clinical significance: Trace left pleural effusion. Aortic Atherosclerosis (ICD10-I70.0). Coronary atherosclerosis. Stable ascending thoracic aortic aneurysm at 4.1 cm. Mild cardiomegaly. Prior left nephrectomy. Small umbilical hernia contains trace fluid. Sigmoid colon diverticulosis. Lumbar spondylosis and degenerative disc disease. Possible pelvic floor laxity. Electronically Signed   By: Van Clines M.D.   On: 06/12/2020 09:25   IR IMAGING GUIDED PORT INSERTION  Result Date: 05/23/2020 CLINICAL DATA:  Metastatic adenocarcinoma of the left lung and need for porta cath for venous access and initiation of IV immunotherapy. EXAM: IMPLANTED PORT A CATH PLACEMENT WITH ULTRASOUND AND FLUOROSCOPIC GUIDANCE ANESTHESIA/SEDATION: 2.0 mg IV Versed; 50 mcg IV Fentanyl Total Moderate Sedation Time:  29 minutes The patient's level of consciousness and physiologic status were continuously monitored during the procedure by Radiology nursing. Additional Medications: 2 g IV Ancef. FLUOROSCOPY TIME:  24 seconds.  2.8 mGy. PROCEDURE: The procedure, risks, benefits, and alternatives were explained to the patient. Questions regarding the procedure were encouraged and answered. The patient understands and consents to the procedure. A time-out was performed prior to initiating the procedure. Ultrasound was utilized to confirm patency of the right internal jugular vein. The right neck and chest were prepped with chlorhexidine in a sterile  fashion, and a sterile drape was applied covering the operative field. Maximum barrier sterile technique with sterile gowns and gloves were used for the procedure. Local anesthesia was provided with 1% lidocaine. After creating a small venotomy incision, a 21 gauge needle was advanced into the right internal jugular vein under direct, real-time ultrasound guidance. Ultrasound image documentation was performed. After securing guidewire access, an 8 Fr dilator was placed. A J-wire was kinked to measure appropriate catheter length. A subcutaneous port pocket was then created along the upper chest wall utilizing sharp and blunt dissection. Portable cautery was utilized. The pocket was irrigated with sterile saline. A single lumen power injectable port was chosen for placement. The 8 Fr catheter  was tunneled from the port pocket site to the venotomy incision. The port was placed in the pocket. External catheter was trimmed to appropriate length based on guidewire measurement. At the venotomy, an 8 Fr peel-away sheath was placed over a guidewire. The catheter was then placed through the sheath and the sheath removed. Final catheter positioning was confirmed and documented with a fluoroscopic spot image. The port was accessed with a needle and aspirated and flushed with heparinized saline. The access needle was removed. The venotomy and port pocket incisions were closed with subcutaneous 3-0 Vicryl and subcuticular 4-0 Monocryl. Dermabond was applied to both incisions. COMPLICATIONS: COMPLICATIONS None FINDINGS: After catheter placement, the tip lies at the cavo-atrial junction. The catheter aspirates normally and is ready for immediate use. IMPRESSION: Placement of single lumen port a cath via right internal jugular vein. The catheter tip lies at the cavo-atrial junction. A power injectable port a cath was placed and is ready for immediate use. Electronically Signed   By: Aletta Edouard M.D.   On: 05/23/2020 16:02     ASSESSMENT & PLAN:   Primary cancer of left upper lobe of lung ([place]) #Non-small cell lung cancer/adenocarcinoma- [ PD-L1-95%]  left lung mass/invading the mediastinum; Stage IV [brain metastases]; QNS for NGS; GUARDANT-NEGATIVE. FEB 9th, 2022- PET scan- NO DISTANT METASTATIC DISEASE; improved left upper lobe mass SUV; left upper lobe radiation pneumonitis changes; presacral mass chronic-see below.  #Since no progressive disease noted on the repeat PET scan-given patient's ongoing fatigue/gait instability I think is reasonable to continue surveillance.  However patient is noted to have recurrent progressive tumor fever-immunotherapy could be recommended.   # presacral mass-without significant SUV uptake suspect-benign etiology monitor for now.  #Seizure secondary to brain metastases/edema x2 [10 to 15 millimeters]; s/p SBRT 10/13; tapered off dexamethasone; MRI brain without contrast- improved.  Continue Keppra.  #Back pain likely degenerative as no metastasis noted on recent PET scan; hydrocodone q 12 hour; MS contin 15 mg BID. STABLE.   # Solitary kidney-normal renal function- STABLE; MDR- colonization- s/p ID evaluation. Antibiotics not recommended. Awaiting urology eval- [facility].   #Gait instability/fatigue-recommend evaluation with [facility] Greet occupational therapy.  # IV access- s/p port placement  # DISPOSITION: # referral to OT/Maure[facility]- gait instability # follow up in 1 month- MD; labs-port flush- cbc/cmp-Dr.B  # I reviewed the blood work- with the patient in detail; also reviewed the imaging independently [as summarized above]; and with the patient in detail.      All questions were answered. The patient knows to call the clinic with any problems, questions or concerns.    [name], MD 06/17/2020 3:56 PM

## 2020-06-17 NOTE — Assessment & Plan Note (Addendum)
#  Non-small cell lung cancer/adenocarcinoma- [ PD-L1-95%]  left lung mass/invading the mediastinum; Stage IV [brain metastases]; QNS for NGS; GUARDANT-NEGATIVE. FEB 9th, 2022- PET scan- NO DISTANT METASTATIC DISEASE; improved left upper lobe mass SUV; left upper lobe radiation pneumonitis changes; presacral mass chronic-see below.  #Since no progressive disease noted on the repeat PET scan-given patient's ongoing fatigue/gait instability I think is reasonable to continue surveillance.  However patient is noted to have recurrent progressive tumor fever-immunotherapy could be recommended.   # presacral mass-without significant SUV uptake suspect-benign etiology monitor for now.  #Seizure secondary to brain metastases/edema x2 [10 to 15 millimeters]; s/p SBRT 10/13; tapered off dexamethasone; MRI brain without contrast- improved.  Continue Keppra.  #Back pain likely degenerative as no metastasis noted on recent PET scan; hydrocodone q 12 hour; MS contin 15 mg BID. STABLE.   # Solitary kidney-normal renal function- STABLE; MDR- colonization- s/p ID evaluation. Antibiotics not recommended. Awaiting urology eval- [place].   #Gait instability/fatigue-recommend evaluation with [facility] occupational therapy.  # IV access- s/p port placement  # DISPOSITION: # referral to OT/Maureen- gait instability # follow up in 1 month- MD; labs-port flush- cbc/cmp-Dr.B  # I reviewed the blood work- with the patient in detail; also reviewed the imaging independently [as summarized above]; and with the patient in detail.

## 2020-06-17 NOTE — Progress Notes (Signed)
Radiation Oncology Follow up Note  Name: Kimberly Huff   Date:   06/17/2020 MRN:  [id number] DOB: February 19, 1946    This 75 y.o. female presents to the clinic today for 1 month follow-up status post SRS for brain metastasis as well as palliative radiation therapy to her chest for stage IV lung cancer.  REFERRING PROVIDER: [name], MD  HPI: Patient is a 75 year old female.  Now at 1 month having completed palliative radiation therapy to both her chest and brain for stage IV non-small cell lung cancer.  She is seen today in routine follow-up is doing well she is currently on treatment break.  She recently had a PET CT scan showing left upper lobe mass substantially reduced in activity.  She is having no headaches no change in visual fields or focal neurologic deficits.  She is still on her antiseizure medication.  She specifically denies any dysphagia.  She does have a slight cough and sometimes with solid food feels nauseated.  That seems to be slightly improving.  COMPLICATIONS OF TREATMENT: none  FOLLOW UP COMPLIANCE: keeps appointments   PHYSICAL EXAM:  There were no vitals taken for this visit. Well-developed well-nourished patient in NAD. HEENT reveals PERLA, EOMI, discs not visualized.  Oral cavity is clear. No oral mucosal lesions are identified. Neck is clear without evidence of cervical or supraclavicular adenopathy. Lungs are clear to A&P. Cardiac examination is essentially unremarkable with regular rate and rhythm without murmur rub or thrill. Abdomen is benign with no organomegaly or masses noted. Motor sensory and DTR levels are equal and symmetric in the upper and lower extremities. Cranial nerves II through XII are grossly intact. Proprioception is intact. No peripheral adenopathy or edema is identified. No motor or sensory levels are noted. Crude visual fields are within normal range.  RADIOLOGY RESULTS: PET CT scan reviewed compatible with above-stated findings  PLAN:  Present time patient is stable and currently on treatment break for stage IV lung cancer.  No areas need to be addressed now for palliation.  I will continue to turn follow-up care over to medical oncology I would be happy to reevaluate the patient anytime should further palliative treatment be indicated.  Patient and family know to call with any concerns.  I would like to take this opportunity to thank you for allowing me to participate in the care of your patient.[name], MD

## 2020-06-17 NOTE — Progress Notes (Signed)
[facility]  Telephone:([phone] [phone]   Name: Kimberly Huff Date: 06/17/2020 MRN: [id number]  DOB: October 22, 1945  Patient Care Team: [name], MD as PCP - General (Internal Medicine) [name], MD (General Surgery) [name], MD (Internal Medicine) Telford Nab, RN as Oncology Nurse Navigator [name], RPH-CPP (Pharmacist)    REASON FOR CONSULTATION: Kimberly Huff is a 75 y.o. 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  Patient was hospitalized 02/02/2020-02/05/2020 with weakness and slurred speech and was found to have 8 cm left lung mass in 2 brain lesions consistent with metastatic disease.  She has had pain and insomnia.  Palliative care was consulted to help address goals and manage ongoing symptoms.  SOCIAL HISTORY:     reports that she quit smoking about 21 years ago. Her smoking use included cigarettes. She has a 19.00 pack-year smoking history. She has never used smokeless tobacco. She reports previous alcohol use. She reports that she does not use drugs.  Patient is unmarried. She was at home alone. She has a daughter in [place], [place] and a son and [place], [place]. Patient retired from the [employer] and then later worked at [employer].   ADVANCE DIRECTIVES:  On file  CODE STATUS: DNR/DNI (DNR order signed on 03/18/20)  PAST MEDICAL HISTORY: Past Medical History:  Diagnosis Date  . Allergy   . Atrophic kidney    left  . Blood clot in vein 45 yrs ago   inabdoment after surgery  . Blood transfusion without reported diagnosis   . Cataract   . Complication of anesthesia   . Coronary artery disease   . Coronary atherosclerosis   . Diabetes mellitus   . Diverticulosis 2005    Dr Jamal Collin  . Dyspnea    with exertion  . External hemorrhoids without mention of complication 3151  . GERD (gastroesophageal reflux disease)   . H/O cardiac catheterization 2005  . Heart murmur 2012   found by Dr. Jamal Collin  . Hyperlipidemia   . Hypertension   . Kidney stone   . Lesion of bladder   . Myocardial infarction ([place]) 2000  . Neuromuscular disorder ([place])    "achy muscles"  . Obesity   . Osteoarthritis   . PONV (postoperative nausea and vomiting)   . Recurrent UTI   . Seizure ([place])   . Spinal stenosis of cervical region   . Steatohepatitis 05/30/10   Brunt Stage 3, non alcoholic cirrhosis of the liver  . Type II or unspecified type diabetes mellitus without mention of complication, uncontrolled    Patient does takes Metformin and recommended to stop for 48 hours.  Marland Kitchen Ulcer   . Unspecified essential hypertension 2014    PAST SURGICAL HISTORY:  Past Surgical History:  Procedure Laterality Date  . ABDOMINAL HYSTERECTOMY  1982   complete  . BREAST CYST ASPIRATION Right 1990's   benign  . BREAST MASS EXCISION     benign  . CARDIAC CATHETERIZATION  2000   cardiac stent  . cardiac stents  2000  . CATARACT EXTRACTION     both eyes  . COLONOSCOPY  2004, 2015   Dr. Jamal Collin, Dr. Olevia Perches  . CYSTOSCOPY W/ RETROGRADES Left 11/12/2015   Procedure: CYSTOSCOPY WITH RETROGRADE PYELOGRAM;  Surgeon: Candee Furbish  McKenzie, MD;  Location: [facility];  Service: Urology;  Laterality: Left;  . CYSTOSCOPY W/ URETERAL STENT PLACEMENT Left 11/12/2015   Procedure: CYSTOSCOPY WITH STENT REPLACEMENT;  Surgeon: Cleon Gustin, MD;  Location: [facility];  Service: Urology;  Laterality: Left;  . CYSTOSCOPY WITH BIOPSY N/A 09/26/2019   Procedure: CYSTOSCOPY WITH BIOPSY AND FULGURATION;  Surgeon: Cleon Gustin, MD;  Location: [facility];  Service: Urology;  Laterality: N/A;  . CYSTOSCOPY WITH URETEROSCOPY AND STENT PLACEMENT Left 10/15/2015   Procedure: CYSTOSCOPY WITH URETEROSCOPY  AND STENT PLACEMENT;  Surgeon: Cleon Gustin, MD;  Location: [facility];  Service: Urology;  Laterality: Left;  . EYE SURGERY Bilateral 2012   cataract  . FOOT SURGERY  2008  . IR IMAGING GUIDED PORT INSERTION  05/23/2020  . PTCA  2000   stent x 1  . ROBOTIC ASSITED PARTIAL NEPHRECTOMY Left 06/02/2019   Procedure: XI ROBOTIC ASSITED NEPHRECTOMY;  Surgeon: Cleon Gustin, MD;  Location: [facility];  Service: Urology;  Laterality: Left;  . SALPINGOOPHORECTOMY    . TONSILLECTOMY    . UPPER GI ENDOSCOPY    . URETEROSCOPY WITH HOLMIUM LASER LITHOTRIPSY Left 11/12/2015   Procedure: URETEROSCOPY WITH HOLMIUM LASER LITHOTRIPSY;  Surgeon: Cleon Gustin, MD;  Location: [facility];  Service: Urology;  Laterality: Left;  . URETEROTOMY  1960/1969   x 2 during childhood  . VAGINAL HYSTERECTOMY    . VIDEO BRONCHOSCOPY WITH ENDOBRONCHIAL NAVIGATION N/A 02/21/2020   Procedure: VIDEO BRONCHOSCOPY WITH ENDOBRONCHIAL NAVIGATION;  Surgeon: Ottie Glazier, MD;  Location: [facility];  Service: Thoracic;  Laterality: N/A;  . VIDEO BRONCHOSCOPY WITH ENDOBRONCHIAL ULTRASOUND N/A 02/21/2020   Procedure: VIDEO BRONCHOSCOPY WITH ENDOBRONCHIAL ULTRASOUND;  Surgeon: Ottie Glazier, MD;  Location: [facility];  Service: Thoracic;  Laterality: N/A;    HEMATOLOGY/ONCOLOGY HISTORY:  Oncology History Overview Note  # OCT 2021-left upper lobe lung mass invading mediastinum; bronc/Bx-  Oct 20th [Dr.Aleskerov]; non-small cell carcinoma/adenocarcinoma.  Stage IV [brain metastases]; NGS-QNS; PDL-1- 95%.  #2021-PET scan left upper lobe mass; presacral question benign mass; no distant site disease  #Right frontal/temporal brain met x2 [1-1.70m]-seizure- on dex; Keppra; SBRT s/p Oct 13th, 2021  # 11/18-RT to LUL ca [? hemoptysis] s/p RT [until jan 11th, 2022]  # DM on OHA; HTN; SOLITARY KIDNEY [frequent infections/ atrophic- s/p nephrectomy 2020]; colonization-multiple drug resistance [s/p ID- Dr.Ravishankar- DEC 2021]  #  NGS/MOLECULAR TESTS:NGS-QNS; guardant testing-inconclusive; PDL-1- 95%.     # PALLIATIVE CARE EVALUATION:03/18/2020  # PAIN MANAGEMENT:   DIAGNOSIS: Lung cancer  STAGE:   IV      ;  GOALS: Palliative  CURRENT/MOST RECENT THERAPY : RT    Brain metastasis ([place]  02/02/2020 Initial Diagnosis   Brain metastasis ([place]   Primary cancer of left upper lobe of lung [place]  02/26/2020 Initial Diagnosis   [place] cancer of left upper lobe of lung ([place]   05/14/2020 Cancer Staging   Staging form: Lung, AJCC 8th Edition - Clinical: Stage IVB (cT3, pM1c) - Signed by BCammie Sickle MD on 05/14/2020     ALLERGIES:  has No Known Allergies.  MEDICATIONS:  Current Outpatient Medications  Medication Sig Dispense Refill  . acetaminophen (TYLENOL) 500 MG tablet Take 500 mg by mouth every 6 (six) hours as needed.    . colchicine 0.6 MG tablet Take 1 tablet (0.6 mg total) by mouth 2 (two) times daily. 20 tablet 0  . Continuous Blood Gluc Receiver (FREESTYLE LIBRE 14 DAY READER) DEVI  Use to check BS 4 times daily 2 each 2  . Continuous Blood Gluc Sensor (FREESTYLE LIBRE 14 DAY SENSOR) MISC Use to check BS 4 times daily 6 each 3  . denosumab (PROLIA) 60 MG/ML SOSY injection Inject into the skin.    . furosemide (LASIX) 40 MG tablet TAKE ONE TABLET EVERY DAY (Patient taking differently: Taking PRN, about every other day) 90 tablet 1  . Insulin Pen Needle 32G X 6 MM MISC 1 Stick by Does not apply route daily. 100 each 1  . LANTUS SOLOSTAR 100 UNIT/ML Solostar Pen INJECT 20 UNITS INTO THE SKIN DAILY AS DIRECTED (Patient taking differently: Inject 15 Units into the skin daily.) 15 mL PRN  . levETIRAcetam (KEPPRA) 500 MG tablet TAKE 1 TABLET BY MOUTH 2 TIMES DAILY. 60 tablet 0  . lidocaine-prilocaine (EMLA) cream Apply 1 application topically as needed. 30-45 mins prior to port access. 30 g 0  . losartan (COZAAR) 50 MG tablet Take 1 tablet (50 mg total) by mouth daily. 90 tablet 3  . morphine (MS  CONTIN) 15 MG 12 hr tablet Take 1 tablet (15 mg total) by mouth every 12 (twelve) hours. 60 tablet 0  . naloxone (NARCAN) nasal spray 4 mg/0.1 mL 1 spray into nostril upon signs of opioid overdose. Call 911. May repeat once if no response within 2-3 minutes. 1 each 0  . omeprazole (PRILOSEC) 40 MG capsule TAKE 1 CAPSULE BY MOUTH ONCE DAILY 90 capsule 1  . ondansetron (ZOFRAN) 8 MG tablet One pill every 8 hours as needed for nausea/vomitting. 90 tablet 1  . ONETOUCH ULTRA test strip USE AS INSTRUCTED 100 each 12  . oxyCODONE (OXY IR/ROXICODONE) 5 MG immediate release tablet Take 1-2 tablets (5-10 mg total) by mouth every 4 (four) hours as needed for severe pain. 90 tablet 0  . prochlorperazine (COMPAZINE) 10 MG tablet Take 1 tablet (10 mg total) by mouth every 6 (six) hours as needed for nausea or vomiting. 40 tablet 1  . rosuvastatin (CRESTOR) 10 MG tablet TAKE ONE TABLET EVERY DAY 90 tablet 1  . sucralfate (CARAFATE) 1 g tablet Take 0.5 tablets (0.5 g total) by mouth 3 (three) times daily. Dissolve in 3-4 tbsp warm water, swallow. 60 tablet 1  . triamcinolone ointment (KENALOG) 0.5 % Apply 1 application topically 2 (two) times daily. 30 g 0  . Vitamin D, Ergocalciferol, (DRISDOL) 1.25 MG (50000 UNIT) CAPS capsule TAKE ONE CAPSULE EACH WEEK 4 capsule 11   No current facility-administered medications for this visit.    VITAL SIGNS: There were no vitals taken for this visit. There were no vitals filed for this visit.  Estimated body mass index is 29.9 kg/m as calculated from the following:   Height as of 06/14/20: 5' 3"  (1.6 m).   Weight as of an earlier encounter on 06/17/20: 168 lb 12.8 oz (76.6 kg).  LABS: CBC:    Component Value Date/Time   WBC 4.2 06/11/2020 0946   HGB 11.2 (L) 06/11/2020 0946   HCT 35.7 (L) 06/11/2020 0946   PLT 194 06/11/2020 0946   MCV 82.4 06/11/2020 0946   NEUTROABS 2.7 06/11/2020 0946   LYMPHSABS 0.9 06/11/2020 0946   MONOABS 0.3 06/11/2020 0946   EOSABS 0.2  06/11/2020 0946   BASOSABS 0.0 06/11/2020 0946   Comprehensive Metabolic Panel:    Component Value Date/Time   NA 137 06/11/2020 0946   NA 140 12/15/2012 0000   K 4.0 06/11/2020 0946   CL 102 06/11/2020 0946  CO2 25 06/11/2020 0946   BUN 10 06/11/2020 0946   BUN 22 (A) 12/15/2012 0000   CREATININE 0.79 06/11/2020 0946   CREATININE 0.85 12/23/2018 1428   GLUCOSE 130 (H) 06/11/2020 0946   CALCIUM 9.1 06/11/2020 0946   AST 15 06/11/2020 0946   ALT 10 06/11/2020 0946   ALKPHOS 63 06/11/2020 0946   BILITOT 0.5 06/11/2020 0946   PROT 6.5 06/11/2020 0946   ALBUMIN 3.3 (L) 06/11/2020 0946    RADIOGRAPHIC STUDIES: NM PET Image Restag (PS) Skull Base To Thigh  Result Date: 06/12/2020 CLINICAL DATA:  Subsequent treatment strategy for non-small cell lung cancer. EXAM: NUCLEAR MEDICINE PET SKULL BASE TO THIGH TECHNIQUE: 9.1 mCi F-18 FDG was injected intravenously. Full-ring PET imaging was performed from the skull base to thigh after the radiotracer. CT data was obtained and used for attenuation correction and anatomic localization. Fasting blood glucose: 110 mg/dl COMPARISON:  Multiple exams, including of an/9/21 FINDINGS: Mediastinal blood pool activity: SUV max 2.7 Liver activity: SUV max NA NECK: No significant abnormal hypermetabolic activity in this region. Incidental CT findings: Bilateral common carotid atherosclerotic calcification. CHEST: The left upper lobe mass measures 5.7 by 4.2 cm on image 70 of series 3 (formerly 7.0 by 3.6 cm) with maximum SUV 5.6 (formerly 14.2). Central hypoenhancement in this mass compatible with suspected central necrosis. There is increasing ground-glass density in the left upper lobe and superior segment left lower lobe possibly result of radiation pneumonitis, correlate with any interval radiation therapy. There is also some distal airspace opacity in the left upper lobe along the periphery of the mass which could be from radiation pneumonitis or  postobstructive pneumonitis. Clustered nodularity in the right upper lobe posteriorly on image 74 series 3 measuring 1.2 by 0.6 cm (previously 0.9 by 0.8 cm), maximum SUV 2.1 (previously 0.9). Incidental CT findings: Trace left pleural effusion. Coronary, aortic arch, and branch vessel atherosclerotic vascular disease. Right Port-A-Cath tip: SVC. Ascending thoracic aortic aneurysm at 4.1 cm. Mild cardiomegaly. Right paratracheal node 1.0 cm in short axis on image 83 series 3 (formerly 0.8 cm), maximum SUV 2.3 (formerly 1.8). ABDOMEN/PELVIS: No significant abnormal hypermetabolic activity in this region. Incidental CT findings: Aortoiliac atherosclerotic vascular disease. Prior left nephrectomy with trace fluid density along the medial spleen unchanged from prior. Compensatory hypertrophy of the right kidney. Photopenic exophytic 2.2 by 3.2 cm lesion with internal density of 20 Hounsfield units from the right mid to lower kidney anteriorly. Small umbilical hernia contains trace fluid. Sigmoid colon diverticulosis. Presacral lesion with some fat density elements once again identified, measuring 4.5 by 3.4 cm, stable. This has low-grade metabolic activity with maximum SUV 3.0, formerly 2.7. Possible pelvic floor laxity. SKELETON: No significant abnormal hypermetabolic activity in this region. Incidental CT findings: Chronic deformity of the right proximal humerus from old healed fracture. Grade 1 degenerative anterolisthesis at L4-5 with grade 1 degenerative retrolisthesis at L1-2. Lumbar spondylosis and degenerative disc disease. IMPRESSION: 1. The left upper lobe mass is substantially reduced in activity, maximum SUV of 5.6 (formerly 14.2). Equivocal reduction in size. 2. Increased ground-glass density in the left upper lobe and superior segment left lower lobe, query interval radiation therapy. Increase airspace opacity distal to the left upper lobe mass, potentially from radiation pneumonitis or postobstructive  pneumonitis. 3. The clustered nodularity in the right upper lobe is similar in size overall, but has mildly increased SUV compared to the prior exam (currently 2.1, previously 0.9). Although I would tend to favor a benign etiology, surveillance  is warranted. 4. There continues to be a presacral mass, with fat density component slightly less prominent than before but without significant change in size. Maximum SUV 3.0, formerly 2.7. I continued to favor this being a benign lesion such as myelolipoma or extramedullary hematopoiesis, surveillance to exclude liposarcoma is recommended. 5. Other imaging findings of potential clinical significance: Trace left pleural effusion. Aortic Atherosclerosis (ICD10-I70.0). Coronary atherosclerosis. Stable ascending thoracic aortic aneurysm at 4.1 cm. Mild cardiomegaly. Prior left nephrectomy. Small umbilical hernia contains trace fluid. Sigmoid colon diverticulosis. Lumbar spondylosis and degenerative disc disease. Possible pelvic floor laxity. Electronically Signed   By: Van Clines M.D.   On: 06/12/2020 09:25   IR IMAGING GUIDED PORT INSERTION  Result Date: 05/23/2020 CLINICAL DATA:  Metastatic adenocarcinoma of the left lung and need for porta cath for venous access and initiation of IV immunotherapy. EXAM: IMPLANTED PORT A CATH PLACEMENT WITH ULTRASOUND AND FLUOROSCOPIC GUIDANCE ANESTHESIA/SEDATION: 2.0 mg IV Versed; 50 mcg IV Fentanyl Total Moderate Sedation Time:  29 minutes The patient's level of consciousness and physiologic status were continuously monitored during the procedure by Radiology nursing. Additional Medications: 2 g IV Ancef. FLUOROSCOPY TIME:  24 seconds.  2.8 mGy. PROCEDURE: The procedure, risks, benefits, and alternatives were explained to the patient. Questions regarding the procedure were encouraged and answered. The patient understands and consents to the procedure. A time-out was performed prior to initiating the procedure. Ultrasound was  utilized to confirm patency of the right internal jugular vein. The right neck and chest were prepped with chlorhexidine in a sterile fashion, and a sterile drape was applied covering the operative field. Maximum barrier sterile technique with sterile gowns and gloves were used for the procedure. Local anesthesia was provided with 1% lidocaine. After creating a small venotomy incision, a 21 gauge needle was advanced into the right internal jugular vein under direct, real-time ultrasound guidance. Ultrasound image documentation was performed. After securing guidewire access, an 8 Fr dilator was placed. A J-wire was kinked to measure appropriate catheter length. A subcutaneous port pocket was then created along the upper chest wall utilizing sharp and blunt dissection. Portable cautery was utilized. The pocket was irrigated with sterile saline. A single lumen power injectable port was chosen for placement. The 8 Fr catheter was tunneled from the port pocket site to the venotomy incision. The port was placed in the pocket. External catheter was trimmed to appropriate length based on guidewire measurement. At the venotomy, an 8 Fr peel-away sheath was placed over a guidewire. The catheter was then placed through the sheath and the sheath removed. Final catheter positioning was confirmed and documented with a fluoroscopic spot image. The port was accessed with a needle and aspirated and flushed with heparinized saline. The access needle was removed. The venotomy and port pocket incisions were closed with subcutaneous 3-0 Vicryl and subcuticular 4-0 Monocryl. Dermabond was applied to both incisions. COMPLICATIONS: COMPLICATIONS None FINDINGS: After catheter placement, the tip lies at the cavo-atrial junction. The catheter aspirates normally and is ready for immediate use. IMPRESSION: Placement of single lumen port a cath via right internal jugular vein. The catheter tip lies at the cavo-atrial junction. A power injectable  port a cath was placed and is ready for immediate use. Electronically Signed   By: Aletta Edouard M.D.   On: 05/23/2020 16:02    PERFORMANCE STATUS (ECOG) : 1 - Symptomatic but completely ambulatory  Review of Systems Unless otherwise noted, a complete review of systems is negative.  Physical Exam General:  NAD Pulmonary: Unlabored Extremities: no edema, no joint deformities Skin: Erythematous rash upper back Neurological: Weakness but otherwise nonfocal  IMPRESSION: Routine follow-up visit.  Overall, patient feels she is doing reasonably well.  She has had weight loss -about 20 pounds over the past 2 months.  She does continue to have occasional nausea but no vomiting.  Daughter asked me about Zofran utilization.  It sounds like patient is currently taking Zofran 6-8 times a day.  I suggested that was too much and would instead recommend backing down the frequency to 3 times daily.  We can always consider adding another antiemetic if needed.  Discussed starting her on oral nutritional supplements once or twice daily.  Offered to refer her to nutritionist for weight loss but patient declined.  Pain is reportedly stable but patient is taking oxycodone 47m (two 560mtablets) several times per day for BTP. Daughter would be interested in increasing to 1072mablets to reduce pill burden. Will send Rx. Another option could be to increase frequency of MS Contin to 3 times daily.  Patient does have some lower extremity weakness.  She still ambulatory without use of assistive devices but she is more hesitant to ambulate outside for fear of falling.  Patient is pending evaluation with [facility], next week.   PLAN: -Continue current scope of treatment -Continue MS Contin 15 mg every 12 hours -Increase oxycodone 10 mg every 4 hours as needed for breakthrough pain, #90 -Daily bowel regimen -Start oral nutritional supplements once or twice daily -Follow-up MyChart visit 1 month   Patient  expressed understanding and was in agreement with this plan. She also understands that She can call the clinic at any time with any questions, concerns, or complaints.     Time Total: 15 minutes  Visit consisted of counseling and education dealing with the complex and emotionally intense issues of symptom management and palliative care in the setting of serious and potentially life-threatening illness.Greater than 50%  of this time was spent counseling and coordinating care related to the above assessment and plan.  Signed by: JosAltha HarmhD, NP-C

## 2020-06-18 ENCOUNTER — Other Ambulatory Visit: Payer: Self-pay | Admitting: *Deleted

## 2020-06-18 MED ORDER — MORPHINE SULFATE ER 15 MG PO TBCR
15.0000 mg | EXTENDED_RELEASE_TABLET | Freq: Two times a day (BID) | ORAL | 0 refills | Status: DC
Start: 2020-06-18 — End: 2020-06-26

## 2020-06-19 ENCOUNTER — Inpatient Hospital Stay: Payer: PPO | Admitting: Occupational Therapy

## 2020-06-19 ENCOUNTER — Encounter: Payer: PPO | Admitting: Hospice and Palliative Medicine

## 2020-06-20 ENCOUNTER — Ambulatory Visit: Payer: PPO | Admitting: Urology

## 2020-06-26 ENCOUNTER — Inpatient Hospital Stay: Payer: PPO | Admitting: Occupational Therapy

## 2020-06-26 ENCOUNTER — Other Ambulatory Visit: Payer: Self-pay | Admitting: Hospice and Palliative Medicine

## 2020-06-26 DIAGNOSIS — M6281 Muscle weakness (generalized): Secondary | ICD-10-CM

## 2020-06-26 MED ORDER — OXYCODONE HCL 10 MG PO TABS: 10.0000 mg | ORAL_TABLET | ORAL | 0 refills | Status: DC | PRN

## 2020-06-26 MED ORDER — LEVETIRACETAM 500 MG PO TABS: 500.0000 mg | ORAL_TABLET | Freq: Two times a day (BID) | ORAL | 2 refills | Status: AC

## 2020-06-26 MED ORDER — MORPHINE SULFATE ER 15 MG PO TBCR: 15.0000 mg | EXTENDED_RELEASE_TABLET | Freq: Two times a day (BID) | ORAL | 0 refills | Status: DC

## 2020-06-26 MED ORDER — ONDANSETRON HCL 8 MG PO TABS: ORAL_TABLET | ORAL | 1 refills | Status: AC

## 2020-06-26 NOTE — Therapy (Signed)
[facility] [address] [place], [place], [place], [address] Phone: [phone]   Fax:  [phone]  Occupational Therapy Evaluation  Patient Details  Name: Kimberly Huff MRN: [id number] Date of Birth: [birth year] No data recorded  Encounter Date: 06/26/2020   OT End of Session - 06/26/20 1312    Visit Number 0           Past Medical History:  Diagnosis Date  . Allergy   . Atrophic kidney    left  . Blood clot in vein 45 yrs ago   inabdoment after surgery  . Blood transfusion without reported diagnosis   . Cataract   . Complication of anesthesia   . Coronary artery disease   . Coronary atherosclerosis   . Diabetes mellitus   . Diverticulosis 2005   Dr Jamal Collin  . Dyspnea    with exertion  . External hemorrhoids without mention of complication 6803  . GERD (gastroesophageal reflux disease)   . H/O cardiac catheterization 2005  . Heart murmur 2012   found by Dr. Jamal Collin  . Hyperlipidemia   . Hypertension   . Kidney stone   . Lesion of bladder   . Myocardial infarction ([place]) 2000  . Neuromuscular disorder ([place])    "achy muscles"  . Obesity   . Osteoarthritis   . PONV (postoperative nausea and vomiting)   . Recurrent UTI   . Seizure ([place])   . Spinal stenosis of cervical region   . Steatohepatitis 05/30/10   Brunt Stage 3, non alcoholic cirrhosis of the liver  . Type II or unspecified type diabetes mellitus without mention of complication, uncontrolled    Patient does takes Metformin and recommended to stop for 48 hours.  Marland Kitchen Ulcer   . Unspecified essential hypertension 2014    Past Surgical History:  Procedure Laterality Date  . ABDOMINAL HYSTERECTOMY  1982   complete  . BREAST CYST ASPIRATION Right 1990's   benign  . BREAST MASS EXCISION     benign  . CARDIAC CATHETERIZATION  2000   cardiac stent  . cardiac stents  2000  . CATARACT EXTRACTION     both eyes  . COLONOSCOPY  2004, 2015   Dr.  Jamal Collin, Dr. Olevia Perches  . CYSTOSCOPY W/ RETROGRADES Left 11/12/2015   Procedure: CYSTOSCOPY WITH RETROGRADE PYELOGRAM;  Surgeon: Cleon Gustin, MD;  Location: [facility];  Service: Urology;  Laterality: Left;  . CYSTOSCOPY W/ URETERAL STENT PLACEMENT Left 11/12/2015   Procedure: CYSTOSCOPY WITH STENT REPLACEMENT;  Surgeon: Cleon Gustin, MD;  Location: [facility];  Service: Urology;  Laterality: Left;  . CYSTOSCOPY WITH BIOPSY N/A 09/26/2019   Procedure: CYSTOSCOPY WITH BIOPSY AND FULGURATION;  Surgeon: Cleon Gustin, MD;  Location: [facility];  Service: Urology;  Laterality: N/A;  . CYSTOSCOPY WITH URETEROSCOPY AND STENT PLACEMENT Left 10/15/2015   Procedure: CYSTOSCOPY WITH URETEROSCOPY AND STENT PLACEMENT;  Surgeon: Cleon Gustin, MD;  Location: [facility];  Service: Urology;  Laterality: Left;  . EYE SURGERY Bilateral 2012   cataract  . FOOT SURGERY  2008  . IR IMAGING GUIDED PORT INSERTION  05/23/2020  . PTCA  2000   stent x 1  . ROBOTIC ASSITED PARTIAL NEPHRECTOMY Left 06/02/2019   Procedure: XI ROBOTIC ASSITED NEPHRECTOMY;  Surgeon: Cleon Gustin, MD;  Location: [facility];  Service: Urology;  Laterality: Left;  . SALPINGOOPHORECTOMY    . TONSILLECTOMY    . UPPER GI ENDOSCOPY    .  URETEROSCOPY WITH HOLMIUM LASER LITHOTRIPSY Left 11/12/2015   Procedure: URETEROSCOPY WITH HOLMIUM LASER LITHOTRIPSY;  Surgeon: Cleon Gustin, MD;  Location: [facility];  Service: Urology;  Laterality: Left;  . URETEROTOMY  1960/1969   x 2 during childhood  . VAGINAL HYSTERECTOMY    . VIDEO BRONCHOSCOPY WITH ENDOBRONCHIAL NAVIGATION N/A 02/21/2020   Procedure: VIDEO BRONCHOSCOPY WITH ENDOBRONCHIAL NAVIGATION;  Surgeon: Ottie Glazier, MD;  Location: [facility];  Service: Thoracic;  Laterality: N/A;  . VIDEO BRONCHOSCOPY WITH ENDOBRONCHIAL ULTRASOUND N/A 02/21/2020   Procedure: VIDEO BRONCHOSCOPY WITH ENDOBRONCHIAL ULTRASOUND;  Surgeon: Ottie Glazier, MD;  Location: [facility];   Service: Thoracic;  Laterality: N/A;    There were no vitals filed for this visit.   Subjective Assessment - 06/26/20 1310    Subjective  My legs are weak and I do not walk out side alone - and inside hold onto furniture -did not had any falls but do fear for falling and feal weak    Currently in Pain? Yes    Pain Score 5     Pain Location --   all over   Pain Descriptors / Indicators Aching   stiff   Pain Type Chronic pain    Pain Onset More than a month ago            NOTE FROM Billey Chang NP: on 06/17/20  Routine follow-up visit.  Overall, patient feels she is doing reasonably well.  She has had weight loss -about 20 pounds over the past 2 months.  She does continue to have occasional nausea but no vomiting.  Daughter asked me about Zofran utilization.  It sounds like patient is currently taking Zofran 6-8 times a day.  I suggested that was too much and would instead recommend backing down the frequency to 3 times daily.  We can always consider adding another antiemetic if needed.  Discussed starting her on oral nutritional supplements once or twice daily.  Offered to refer her to nutritionist for weight loss but patient declined.  Pain is reportedly stable but patient is taking oxycodone 55m (two 548mtablets) several times per day for BTP. Daughter would be interested in increasing to 1077mablets to reduce pill burden. Will send Rx. Another option could be to increase frequency of MS Contin to 3 times daily.  Patient does have some lower extremity weakness.  She still ambulatory without use of assistive devices but she is more hesitant to ambulate outside for fear of falling.  Patient is pending evaluation with MauGwenette GreetT, next week.   PLAN: -Continue current scope of treatment -Continue MS Contin 15 mg every 12 hours -Increase oxycodone 10 mg every 4 hours as needed for breakthrough pain, #90 -Daily bowel regimen -Start oral nutritional supplements once or twice  daily -Follow-up MyChart visit 1 month   OT SCREEN ON 06/26/20: Pt lives alone - [place] 3 nights with her - she and pt 's sister is pt's driver to appt. Daughter this date with pt. Pt lives in one story house/condo- one step to enter Shower is walk in - hand rail and hand held shower - do not sit down on chair - but can She does her meals and laundry -and independent in ADL's Hold on to furniture and cart in grocery cart. Do not walk outside of house alone. BERG balance test 42/50- did break some toes last year and had kidney surgery -and seizure with weakness - when they  found the CA with metastasis to brain Decrease ankle AROM in foot that she broked toes, and cannot stand on toes or pressure over metatarsals or toes Decrease quad and gluteus strength Pt decline PT or CARE program at this time - wants to do some HEP for 3 wks and follow up: HEP:  150 min week  or 22 min day of exercises - sit <> stand from chair with pillow - do not push up 8 reps -sideway stepping at sink - 1-2 min  -back wards walking - daughter guard her - 1-2 min - wide base  - ankle dorsi and plantar flexion 10 reps - circle of ankle R and L - 10 reps - walk 1-2 min and increase gradually every 4 days -if no increase pain  Hand out review about - fall prevention and energy conservation                                P        Visit Diagnosis: Muscle weakness (generalized)    Problem List Patient Active Problem List   Diagnosis Date Noted  . Aortic arch atherosclerosis ([place]) 03/19/2020  . Aortic aneurysm ([place]) 03/19/2020  . Primary cancer of left upper lobe of lung ([place]) 02/26/2020  . Aneurysm of cavernous portion of right internal carotid artery 02/03/2020  . Brain mass 02/02/2020  . Brain metastasis ([place]) 02/02/2020  . Obesity (BMI 30.0-34.9)   . Back pain, chronic 01/22/2020  . OAB (overactive bladder) 12/13/2019  .  Chronic cystitis 10/08/2019  . Pelvic mass in female 03/28/2019  . Thoracic spondylosis without myelopathy 03/28/2019  . Osteoporosis 11/12/2017  . Urge incontinence of urine 02/25/2016  . Hospital discharge follow-up 10/26/2015  . Benign essential HTN 10/25/2015  . Atrophic kidney 10/25/2015  . History of myocardial infarction 10/25/2015  . Microalbuminuria due to type 2 diabetes mellitus ([place]) 06/29/2015  . Personal history of gastric ulcer 02/23/2013  . Nontraumatic shoulder pain, right 02/23/2013  . Vitamin D deficiency 02/23/2013  . History of right humeral fracture  02/16/2012  . Gouty arthritis 08/25/2011  . Type 2 diabetes mellitus, controlled, with renal complications ([place]) 81/02/3158  . Hemorrhoid 05/27/2011  . Dyslipidemia associated with type 2 diabetes mellitus ([place]) 02/16/2011  . Cataract extraction status 02/16/2011  . Nonfunctioning left kidney 02/16/2011  . Cirrhosis, non-alcoholic ([place]) 45/85/9292    Rosalyn Gess OTR/L,CLT 06/26/2020, 1:12 PM  [facility] [address] [place], [place], [place], [address] Phone: [phone]   Fax:  [phone]  Name: Kimberly Huff MRN: [id number] Date of Birth: 17-Feb-1946

## 2020-07-01 ENCOUNTER — Encounter: Payer: Self-pay | Admitting: Urology

## 2020-07-01 ENCOUNTER — Other Ambulatory Visit: Payer: Self-pay

## 2020-07-01 ENCOUNTER — Telehealth: Payer: Self-pay | Admitting: *Deleted

## 2020-07-01 ENCOUNTER — Ambulatory Visit (INDEPENDENT_AMBULATORY_CARE_PROVIDER_SITE_OTHER): Payer: PPO | Admitting: Urology

## 2020-07-01 VITALS — BP 139/74 | HR 76 | Temp 98.2°F | Ht 63.0 in | Wt 168.0 lb

## 2020-07-01 DIAGNOSIS — N3020 Other chronic cystitis without hematuria: Secondary | ICD-10-CM

## 2020-07-01 DIAGNOSIS — N3281 Overactive bladder: Secondary | ICD-10-CM

## 2020-07-01 LAB — URINALYSIS, ROUTINE W REFLEX MICROSCOPIC
Bilirubin, UA: NEGATIVE
Glucose, UA: NEGATIVE
Leukocytes,UA: NEGATIVE
Nitrite, UA: NEGATIVE
Protein,UA: NEGATIVE
RBC, UA: NEGATIVE
Specific Gravity, UA: 1.02 (ref 1.005–1.030)
Urobilinogen, Ur: 0.2 mg/dL (ref 0.2–1.0)
pH, UA: 5 (ref 5.0–7.5)

## 2020-07-01 LAB — MICROSCOPIC EXAMINATION: Renal Epithel, UA: NONE SEEN /hpf

## 2020-07-01 LAB — BLADDER SCAN AMB NON-IMAGING: Scan Result: 14

## 2020-07-01 MED ORDER — TRIMETHOPRIM 100 MG PO TABS: 100.0000 mg | ORAL_TABLET | Freq: Every day | ORAL | 11 refills | Status: DC

## 2020-07-01 NOTE — Telephone Encounter (Signed)
Heather/Taylor- please inform daughter, Kimberly Huff that I have reviewed the medication. The medication suggested by Urology is ok from our stand point. Thanks GB

## 2020-07-01 NOTE — Telephone Encounter (Signed)
Carlisle Beers called regarding the patient seeing her nephrologist today, Dr Alyson Ingles. He had her on daily Macrobid prior to seeing [place] to prevent UTI's and now he wants to start her on a new drug Tremeth or Primsol (?spelling) but want to be sure that it will not interfere with her treatment at our office. She requests a return call with answer and said to leave message on her voice mail if she does not answer.

## 2020-07-01 NOTE — Progress Notes (Signed)
07/01/2020 10:28 AM   Kimberly Huff 05-24-1945 [id number]  Referring provider: [name], MD [address] [place],  [place] [address]  followup recurrent UTI and OAB  HPI: Kimberly Huff is a 75yo here for followup for recurrent UTI and OAB. She is currently being treated for Stage 4 lung cancer with brain mets. She underwent gamma knife for brain mets and radiation/chemo for her lung mass. She is off her macrobid for UTI and mirabegron. No UTIS since last visit. She uses 3 pads per day. She has occasional urgency and frequency.  She does not recall if the mirabegron 19m improved her OAB symptoms   PMH: Past Medical History:  Diagnosis Date  . Allergy   . Atrophic kidney    left  . Blood clot in vein 45 yrs ago   inabdoment after surgery  . Blood transfusion without reported diagnosis   . Cataract   . Complication of anesthesia   . Coronary artery disease   . Coronary atherosclerosis   . Diabetes mellitus   . Diverticulosis 2005   Dr SJamal Collin . Dyspnea    with exertion  . External hemorrhoids without mention of complication 20998 . GERD (gastroesophageal reflux disease)   . H/O cardiac catheterization 2005  . Heart murmur 2012   found by Dr. SJamal Collin . Hyperlipidemia   . Hypertension   . Kidney stone   . Lesion of bladder   . Myocardial infarction ([place] 2000  . Neuromuscular disorder ([place]    "achy muscles"  . Obesity   . Osteoarthritis   . PONV (postoperative nausea and vomiting)   . Recurrent UTI   . [place]   . Spinal stenosis of cervical region   . Steatohepatitis 05/30/10   Brunt Stage 3, non alcoholic cirrhosis of the liver  . Type II or unspecified type diabetes mellitus without mention of complication, uncontrolled    Patient does takes Metformin and recommended to stop for 48 hours.  .Marland KitchenUlcer   . Unspecified essential hypertension 2014    Surgical History: Past Surgical History:  Procedure Laterality Date  . ABDOMINAL  HYSTERECTOMY  1982   complete  . BREAST CYST ASPIRATION Right 1990's   benign  . BREAST MASS EXCISION     benign  . CARDIAC CATHETERIZATION  2000   cardiac stent  . cardiac stents  2000  . CATARACT EXTRACTION     both eyes  . COLONOSCOPY  2004, 2015   Dr. SJamal Collin Dr. BOlevia Perches . CYSTOSCOPY W/ RETROGRADES Left 11/12/2015   Procedure: CYSTOSCOPY WITH RETROGRADE PYELOGRAM;  Surgeon: PCleon Gustin MD;  Location: [facility];  Service: Urology;  Laterality: Left;  . CYSTOSCOPY W/ URETERAL STENT PLACEMENT Left 11/12/2015   Procedure: CYSTOSCOPY WITH STENT REPLACEMENT;  Surgeon: PCleon Gustin MD;  Location: [facility];  Service: Urology;  Laterality: Left;  . CYSTOSCOPY WITH BIOPSY N/A 09/26/2019   Procedure: CYSTOSCOPY WITH BIOPSY AND FULGURATION;  Surgeon: MCleon Gustin MD;  [facility]  Service: Urology;  Laterality: N/A;  . CYSTOSCOPY WITH URETEROSCOPY AND STENT PLACEMENT Left 10/15/2015   Procedure: CYSTOSCOPY WITH URETEROSCOPY AND STENT PLACEMENT;  Surgeon: PCleon Gustin MD;  Location: [facility];  Service: Urology;  Laterality: Left;  . EYE SURGERY Bilateral 2012   cataract  . FOOT SURGERY  2008  . IR IMAGING GUIDED PORT INSERTION  05/23/2020  . PTCA  2000   stent x 1  . ROBOTIC  ASSITED PARTIAL NEPHRECTOMY Left 06/02/2019   Procedure: XI ROBOTIC ASSITED NEPHRECTOMY;  Surgeon: Cleon Gustin, MD;  [facility];  Service: Urology;  Laterality: Left;  . SALPINGOOPHORECTOMY    . TONSILLECTOMY    . UPPER GI ENDOSCOPY    . URETEROSCOPY WITH HOLMIUM LASER LITHOTRIPSY Left 11/12/2015   Procedure: URETEROSCOPY WITH HOLMIUM LASER LITHOTRIPSY;  Surgeon: Cleon Gustin, MD;  Location: [facility];  Service: Urology;  Laterality: Left;  . URETEROTOMY  1960/1969   x 2 during childhood  . VAGINAL HYSTERECTOMY    . VIDEO BRONCHOSCOPY WITH ENDOBRONCHIAL NAVIGATION N/A 02/21/2020   Procedure: VIDEO BRONCHOSCOPY WITH ENDOBRONCHIAL NAVIGATION;  Surgeon:  Ottie Glazier, MD;  Location: [facility];  Service: Thoracic;  Laterality: N/A;  . VIDEO BRONCHOSCOPY WITH ENDOBRONCHIAL ULTRASOUND N/A 02/21/2020   Procedure: VIDEO BRONCHOSCOPY WITH ENDOBRONCHIAL ULTRASOUND;  Surgeon: Ottie Glazier, MD;  Location: [facility];  Service: Thoracic;  Laterality: N/A;    Home Medications:  Allergies as of 07/01/2020   No Known Allergies     Medication List       Accurate as of July 01, 2020 10:28 AM. If you have any questions, ask your nurse or doctor.        acetaminophen 500 MG tablet Commonly known as: TYLENOL Take 500 mg by mouth every 6 (six) hours as needed.   colchicine 0.6 MG tablet Take 1 tablet (0.6 mg total) by mouth 2 (two) times daily.   denosumab 60 MG/ML Sosy injection Commonly known as: PROLIA Inject into the skin.   FreeStyle Libre 14 Day Reader Kerrin Mo Use to check BS 4 times daily   FreeStyle Libre 14 Day Sensor Misc Use to check BS 4 times daily   furosemide 40 MG tablet Commonly known as: LASIX TAKE ONE TABLET EVERY DAY What changed:   how much to take  how to take this  when to take this  additional instructions   Insulin Pen Needle 32G X 6 MM Misc 1 Stick by Does not apply route daily.   Lantus SoloStar 100 UNIT/ML Solostar Pen Generic drug: insulin glargine INJECT 20 UNITS INTO THE SKIN DAILY AS DIRECTED What changed: See the new instructions.   levETIRAcetam 500 MG tablet Commonly known as: KEPPRA Take 1 tablet (500 mg total) by mouth 2 (two) times daily.   lidocaine-prilocaine cream Commonly known as: EMLA Apply 1 application topically as needed. 30-45 mins prior to port access.   losartan 50 MG tablet Commonly known as: COZAAR Take 1 tablet (50 mg total) by mouth daily.   morphine 15 MG 12 hr tablet Commonly known as: Kimberly CONTIN Take 1 tablet (15 mg total) by mouth every 12 (twelve) hours.   naloxone 4 MG/0.1ML Liqd nasal spray kit Commonly known as: NARCAN 1 spray into nostril upon signs  of opioid overdose. Call 911. May repeat once if no response within 2-3 minutes.   omeprazole 40 MG capsule Commonly known as: PRILOSEC TAKE 1 CAPSULE BY MOUTH ONCE DAILY   ondansetron 8 MG tablet Commonly known as: ZOFRAN One pill every 8 hours as needed for nausea/vomitting.   OneTouch Ultra test strip Generic drug: glucose blood USE AS INSTRUCTED   Oxycodone HCl 10 MG Tabs Take 1 tablet (10 mg total) by mouth every 4 (four) hours as needed.   prochlorperazine 10 MG tablet Commonly known as: COMPAZINE Take 1 tablet (10 mg total) by mouth every 6 (six) hours as needed for nausea or vomiting.   rosuvastatin 10 MG tablet Commonly  known as: CRESTOR TAKE ONE TABLET EVERY DAY   sucralfate 1 g tablet Commonly known as: Carafate Take 0.5 tablets (0.5 g total) by mouth 3 (three) times daily. Dissolve in 3-4 tbsp warm water, swallow.   triamcinolone ointment 0.5 % Commonly known as: KENALOG Apply 1 application topically 2 (two) times daily.   Vitamin D (Ergocalciferol) 1.25 MG (50000 UNIT) Caps capsule Commonly known as: DRISDOL TAKE ONE CAPSULE EACH WEEK       Allergies: No Known Allergies  Family History: Family History  Problem Relation Age of Onset  . Lung cancer Father   . Heart disease Father   . Diverticulosis Mother   . Diabetes Mother   . Stroke Mother 49  . Autoimmune disease Daughter   . Thyroid disease Daughter   . Thyroid disease Sister   . Stomach cancer Maternal Aunt   . Breast cancer Maternal Aunt   . Colon cancer Neg Hx   . Esophageal cancer Neg Hx   . Rectal cancer Neg Hx     Social History:  reports that she quit smoking about 21 years ago. Her smoking use included cigarettes. She has a 19.00 pack-year smoking history. She has never used smokeless tobacco. She reports previous alcohol use. She reports that she does not use drugs.  ROS: All other review of systems were reviewed and are negative except what is noted above in HPI  Physical  Exam: BP 139/74   Pulse 76   Temp 98.2 F (36.8 C)   Ht 5' 3"  (1.6 m)   Wt 168 lb (76.2 kg)   BMI 29.76 kg/m   Constitutional:  Alert and oriented, No acute distress. HEENT: [place] AT, moist mucus membranes.  Trachea midline, no masses. Cardiovascular: No clubbing, cyanosis, or edema. Respiratory: Normal respiratory effort, no increased work of breathing. GI: Abdomen is soft, nontender, nondistended, no abdominal masses GU: No CVA tenderness.  Lymph: No cervical or inguinal lymphadenopathy. Skin: No rashes, bruises or suspicious lesions. Neurologic: Grossly intact, no focal deficits, moving all 4 extremities. Psychiatric: Normal mood and affect.  Laboratory Data: Lab Results  Component Value Date   WBC 4.2 06/11/2020   HGB 11.2 (L) 06/11/2020   HCT 35.7 (L) 06/11/2020   MCV 82.4 06/11/2020   PLT 194 06/11/2020    Lab Results  Component Value Date   CREATININE 0.79 06/11/2020    No results found for: PSA  No results found for: TESTOSTERONE  Lab Results  Component Value Date   HGBA1C 7.3 (H) 02/02/2020    Urinalysis    Component Value Date/Time   COLORURINE YELLOW (A) 03/26/2020 0856   APPEARANCEUR HAZY (A) 03/26/2020 0856   APPEARANCEUR Clear 12/13/2019 1551   LABSPEC 1.008 03/26/2020 0856   PHURINE 6.0 03/26/2020 0856   GLUCOSEU NEGATIVE 03/26/2020 0856   GLUCOSEU NEGATIVE 01/18/2020 1042   HGBUR NEGATIVE 03/26/2020 0856   BILIRUBINUR NEGATIVE 03/26/2020 0856   BILIRUBINUR Negative 12/13/2019 1551   KETONESUR NEGATIVE 03/26/2020 0856   PROTEINUR NEGATIVE 03/26/2020 0856   UROBILINOGEN 0.2 01/18/2020 1042   NITRITE NEGATIVE 03/26/2020 0856   LEUKOCYTESUR TRACE (A) 03/26/2020 0856    Lab Results  Component Value Date   LABMICR Comment 12/13/2019   WBCUA 11-30 (A) 06/08/2016   RBCUA 0-2 06/08/2016   LABEPIT 0-10 06/08/2016   BACTERIA FEW (A) 03/26/2020    Pertinent Imaging:  No results found for this or any previous visit.  No results found for  this or any previous visit.  No results  found for this or any previous visit.  No results found for this or any previous visit.  Results for orders placed during the hospital encounter of 06/05/16  US Renal  Narrative CLINICAL DATA:  Nephrolithiasis  EXAM: RENAL / URINARY TRACT ULTRASOUND COMPLETE  COMPARISON:  None.  FINDINGS: Right Kidney:  Length: 13.0 cm. Small extrarenal pelvis. Anechoic cyst in the lower pole. No hydronephrosis.  Left Kidney:  Length: 7.2 cm. Renal cortical thinning with lobular contour. No nephrolithiasis. No hydronephrosis  Bladder:  Appears normal for degree of bladder distention.  Liver is increased in echogenicity.  IMPRESSION: 1. Small LEFT kidney.  No obstruction identified. 2. Hepatic steatosis the liver.   Electronically Signed By: Suzy Bouchard M.D. On: 06/05/2016 16:33  No results found for this or any previous visit.  No results found for this or any previous visit.  Results for orders placed during the hospital encounter of 03/13/19  CT RENAL STONE STUDY  Narrative CLINICAL DATA:  Left flank pain  EXAM: CT ABDOMEN AND PELVIS WITHOUT CONTRAST  TECHNIQUE: Multidetector CT imaging of the abdomen and pelvis was performed following the standard protocol without IV contrast.  COMPARISON:  10/15/2015  FINDINGS: Lower chest: Coronary artery calcifications. No acute findings within the visualized lower chest.  Hepatobiliary: Subtle surface nodularity of the hepatic parenchyma. Subcentimeter area of ill-defined low density within the left hepatic lobe (series 2, image 25), not definitively seen on prior CT. Additional low-density subcentimeter lesion within the posterior aspect of the right hepatic lobe (series 2, image 27) appears unchanged from prior. Gallbladder appears unremarkable. No intrahepatic biliary dilatation.  Pancreas: Unremarkable. No pancreatic ductal dilatation or surrounding inflammatory  changes.  Spleen: Normal in size without focal abnormality.  Adrenals/Urinary Tract: Unremarkable adrenal glands. Chronic atrophy of the left kidney without evidence of left-sided obstructive uropathy. Mildly prominent right extrarenal pelvis, unchanged in appearance from prior CT. Previously seen cystic lesion within the mid to lower pole of the right kidney has increased in size now measuring 3.3 cm in diameter (previously 1.9 cm) and now measures greater than simple fluid density. Urinary bladder is within normal limits for the degree of distension.  Stomach/Bowel: Stomach is within normal limits. Appendix appears normal. Extensive sigmoid diverticulosis. No evidence of bowel wall thickening, distention, or inflammatory changes.  Vascular/Lymphatic: Aortic atherosclerosis. No enlarged abdominal or pelvic lymph nodes.  Reproductive: Status post hysterectomy. No adnexal masses.  Other: There is a rounded 3.9 by 3.1 by 4.1 cm presacral soft tissue mass containing internal areas of macroscopic fat (series 2, image 66; series 5, image 96). No underlying destruction or cortical regularity of the adjacent distal sacrum. This mass has increased in size from prior CT when it measured up to 3.7 cm. Small fat containing umbilical hernia. No ascites.  Musculoskeletal: Advanced multilevel lumbar spondylosis most severe at L1-2. There is grade 1 anterolisthesis L4 on L5. No acute osseous findings.  IMPRESSION: 1. Negative for obstructive uropathy. There is chronic atrophy of the left kidney. 2. 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 A low-grade retroperitoneal liposarcoma would be difficult to entirely exclude given slight interval increase in size from prior. Given the slow interval growth, a follow-up CT in 6-12 months could be considered to assess for stability. Alternatively, percutaneous biopsy could be considered  for definitive diagnosis. 3. Slight surface nodularity of the hepatic parenchyma raising the possibility of cirrhosis. 4. Subcentimeter area of ill-defined low-density within the left  hepatic lobe, not definitively seen on prior CT. Attention on follow-up is recommended. 5. Slight interval increase in size of cystic lesion within the mid to lower pole of the right kidney now measuring 3.3 cm in diameter (previously 1.9 cm) with greater than simple fluid density. This may represent a proteinaceous or hemorrhagic cyst. Consider renal ultrasound to further characterize. 6. Extensive sigmoid diverticulosis without acute diverticulitis.   Electronically Signed By: Davina Poke M.D. On: 03/13/2019 15:05   Assessment & Plan:    1. OAB (overactive bladder) - Patient elects for observation of her OAb since she is not bothered by her LUTS and incontinence - Urinalysis, Routine w reflex microscopic - Bladder Scan (Post Void Residual) in office  2. Chronic cystitis -trimethoprim 16m qhs   No follow-ups on file.  PNicolette Bang MD  [facility]

## 2020-07-01 NOTE — Patient Instructions (Signed)
Overactive Bladder, Adult  Overactive bladder is a condition in which a person has a sudden and frequent need to urinate. A person might also leak urine if he or she cannot get to the bathroom fast enough (urinary incontinence). Sometimes, symptoms can interfere with work or social activities. What are the causes? Overactive bladder is associated with poor nerve signals between your bladder and your brain. Your bladder may get the signal to empty before it is full. You may also have very sensitive muscles that make your bladder squeeze too soon. This condition may also be caused by other factors, such as:  Medical conditions: ? Urinary tract infection. ? Infection of nearby tissues. ? Prostate enlargement. ? Bladder stones, inflammation, or tumors. ? Diabetes. ? Muscle or nerve weakness, especially from these conditions:  A spinal cord injury.  Stroke.  Multiple sclerosis.  Parkinson's disease.  Other causes: ? Surgery on the uterus or urethra. ? Drinking too much caffeine or alcohol. ? Certain medicines, especially those that eliminate extra fluid in the body (diuretics). ? Constipation. What increases the risk? You may be at greater risk for overactive bladder if you:  Are an older adult.  Smoke.  Are going through menopause.  Have prostate problems.  Have a neurological disease, such as stroke, dementia, Parkinson's disease, or multiple sclerosis (MS).  Eat or drink alcohol, spicy food, caffeine, and other things that irritate the bladder.  Are overweight or obese. What are the signs or symptoms? Symptoms of this condition include a sudden, strong urge to urinate. Other symptoms include:  Leaking urine.  Urinating 8 or more times a day.  Waking up to urinate 2 or more times overnight. How is this diagnosed? This condition may be diagnosed based on:  Your symptoms and medical history.  A physical exam.  Blood or urine tests to check for possible causes,  such as infection. You may also need to see a health care provider who specializes in urinary tract problems. This is called a urologist. How is this treated? Treatment for overactive bladder depends on the cause of your condition and whether it is mild or severe. Treatment may include:  Bladder training, such as: ? Learning to control the urge to urinate by following a schedule to urinate at regular intervals. ? Doing Kegel exercises to strengthen the pelvic floor muscles that support your bladder.  Special devices, such as: ? Biofeedback. This uses sensors to help you become aware of your body's signals. ? Electrical stimulation. This uses electrodes placed inside the body (implanted) or outside the body. These electrodes send gentle pulses of electricity to strengthen the nerves or muscles that control the bladder. ? Women may use a plastic device, called a pessary, that fits into the vagina and supports the bladder.  Medicines, such as: ? Antibiotics to treat bladder infection. ? Antispasmodics to stop the bladder from releasing urine at the wrong time. ? Tricyclic antidepressants to relax bladder muscles. ? Injections of botulinum toxin type A directly into the bladder tissue to relax bladder muscles.  Surgery, such as: ? A device may be implanted to help manage the nerve signals that control urination. ? An electrode may be implanted to stimulate electrical signals in the bladder. ? A procedure may be done to change the shape of the bladder. This is done only in very severe cases. Follow these instructions at home: Eating and drinking  Make diet or lifestyle changes recommended by your health care provider. These may include: ? Drinking fluids  throughout the day and not only with meals. ? Cutting down on caffeine or alcohol. ? Eating a healthy and balanced diet to prevent constipation. This may include:  Choosing foods that are high in fiber, such as beans, whole grains, and  fresh fruits and vegetables.  Limiting foods that are high in fat and processed sugars, such as fried and sweet foods.   Lifestyle  Lose weight if needed.  Do not use any products that contain nicotine or tobacco. These include cigarettes, chewing tobacco, and vaping devices, such as e-cigarettes. If you need help quitting, ask your health care provider.   General instructions  Take over-the-counter and prescription medicines only as told by your health care provider.  If you were prescribed an antibiotic medicine, take it as told by your health care provider. Do not stop taking the antibiotic even if you start to feel better.  Use any implants or pessary as told by your health care provider.  If needed, wear pads to absorb urine leakage.  Keep a log to track how much and when you drink, and when you need to urinate. This will help your health care provider monitor your condition.  Keep all follow-up visits. This is important. Contact a health care provider if:  You have a fever or chills.  Your symptoms do not get better with treatment.  Your pain and discomfort get worse.  You have more frequent urges to urinate. Get help right away if:  You are not able to control your bladder. Summary  Overactive bladder refers to a condition in which a person has a sudden and frequent need to urinate.  Several conditions may lead to an overactive bladder.  Treatment for overactive bladder depends on the cause and severity of your condition.  Making lifestyle changes, doing Kegel exercises, keeping a log, and taking medicines can help with this condition. This information is not intended to replace advice given to you by your health care provider. Make sure you discuss any questions you have with your health care provider. Document Revised: 01/08/2020 Document Reviewed: 01/08/2020 Elsevier Patient Education  2021 [employer].

## 2020-07-01 NOTE — Progress Notes (Signed)
Urological Symptom Review  Patient is experiencing the following symptoms: Get up at night to urinate Leakage of urine   Review of Systems  Gastrotestinal Nausea Vomiting Indidestion/heartburn    Gastrointestinal (lower) : Diarrhea Constipation  Constitutional : Fever Night Sweats Weight loss  Skin: Skin rash/lesion Itching  Eyes: Negative for eye symptoms  Ear/Nose/Throat : Negative for Ear/Nose/Throat symptoms  Hematologic/Lymphatic: Negative for Hematologic/Lymphatic symptoms  Cardiovascular : Leg swelling  Respiratory : Cough Shortness of breath  Endocrine: Negative for endocrine symptoms  Musculoskeletal: Back pain Joint pain  Neurological: Headaches Dizziness  Psychologic: Depression Anxiety

## 2020-07-02 ENCOUNTER — Ambulatory Visit (INDEPENDENT_AMBULATORY_CARE_PROVIDER_SITE_OTHER): Payer: PPO

## 2020-07-02 DIAGNOSIS — M810 Age-related osteoporosis without current pathological fracture: Secondary | ICD-10-CM

## 2020-07-02 MED ORDER — DENOSUMAB 60 MG/ML ~~LOC~~ SOSY
60.0000 mg | PREFILLED_SYRINGE | Freq: Once | SUBCUTANEOUS | Status: AC
Start: 2020-07-02 — End: 2020-07-02
  Administered 2020-07-02: 60 mg via SUBCUTANEOUS

## 2020-07-02 NOTE — Progress Notes (Signed)
Patient presented for 40-monthProlia injection SQ to right arm. Patient tolerated well.

## 2020-07-02 NOTE — Telephone Encounter (Signed)
I called Kimberly Huff and had to leave a voice mail on her phone that it is fine for patient to take new medicine ordered by her Urologist

## 2020-07-03 LAB — URINE CULTURE

## 2020-07-08 ENCOUNTER — Telehealth: Payer: Self-pay | Admitting: Hospice and Palliative Medicine

## 2020-07-08 ENCOUNTER — Telehealth: Payer: Self-pay | Admitting: *Deleted

## 2020-07-08 DIAGNOSIS — C3412 Malignant neoplasm of upper lobe, left bronchus or lung: Secondary | ICD-10-CM

## 2020-07-08 NOTE — Telephone Encounter (Signed)
Daughter [name] called wanting to hear back from Sharion Dove, NP regarding the fact that Dr Uvaldo Bristle has ordered antibiotics for patient to take indefinitely and she wants to know if this will interfere with any of her other medications. She also wants to know in her mothers case how long she needs to quarantine herself having tested positive for COVID last week away from her mother. Please return her call

## 2020-07-08 NOTE — Telephone Encounter (Signed)
I received a call from patient's daughter.  Daughter tested positive with COVID-19 3 days ago.  She is symptomatic.  We discussed importance of quarantining based on patient's high risk status secondary her cancer diagnosis.  Patient may be a candidate for Evusheld for prophylaxis. Daughter is interested in this. Will place referral for more information.

## 2020-07-10 ENCOUNTER — Other Ambulatory Visit: Payer: Self-pay | Admitting: *Deleted

## 2020-07-10 MED ORDER — OXYCODONE HCL 10 MG PO TABS: 10.0000 mg | ORAL_TABLET | ORAL | 0 refills | Status: DC | PRN

## 2020-07-10 MED ORDER — MORPHINE SULFATE ER 15 MG PO TBCR: 15.0000 mg | EXTENDED_RELEASE_TABLET | Freq: Two times a day (BID) | ORAL | 0 refills | Status: DC

## 2020-07-10 NOTE — Telephone Encounter (Signed)
Daughter requesting refills for morphine and oxycodone orders pended per NP approval.

## 2020-07-15 ENCOUNTER — Encounter: Payer: Self-pay | Admitting: *Deleted

## 2020-07-15 ENCOUNTER — Inpatient Hospital Stay: Payer: PPO | Attending: Internal Medicine

## 2020-07-15 ENCOUNTER — Inpatient Hospital Stay (HOSPITAL_BASED_OUTPATIENT_CLINIC_OR_DEPARTMENT_OTHER): Payer: PPO | Admitting: Hospice and Palliative Medicine

## 2020-07-15 ENCOUNTER — Inpatient Hospital Stay (HOSPITAL_BASED_OUTPATIENT_CLINIC_OR_DEPARTMENT_OTHER): Payer: PPO | Admitting: Internal Medicine

## 2020-07-15 ENCOUNTER — Other Ambulatory Visit: Payer: Self-pay

## 2020-07-15 VITALS — BP 119/79 | HR 77 | Temp 98.3°F | Wt 169.0 lb

## 2020-07-15 DIAGNOSIS — C3412 Malignant neoplasm of upper lobe, left bronchus or lung: Secondary | ICD-10-CM

## 2020-07-15 DIAGNOSIS — K7581 Nonalcoholic steatohepatitis (NASH): Secondary | ICD-10-CM | POA: Diagnosis not present

## 2020-07-15 DIAGNOSIS — R1310 Dysphagia, unspecified: Secondary | ICD-10-CM | POA: Insufficient documentation

## 2020-07-15 DIAGNOSIS — E785 Hyperlipidemia, unspecified: Secondary | ICD-10-CM | POA: Insufficient documentation

## 2020-07-15 DIAGNOSIS — I252 Old myocardial infarction: Secondary | ICD-10-CM | POA: Diagnosis not present

## 2020-07-15 DIAGNOSIS — Z7189 Other specified counseling: Secondary | ICD-10-CM

## 2020-07-15 DIAGNOSIS — Z87891 Personal history of nicotine dependence: Secondary | ICD-10-CM | POA: Insufficient documentation

## 2020-07-15 DIAGNOSIS — R519 Headache, unspecified: Secondary | ICD-10-CM | POA: Diagnosis not present

## 2020-07-15 DIAGNOSIS — I10 Essential (primary) hypertension: Secondary | ICD-10-CM | POA: Insufficient documentation

## 2020-07-15 DIAGNOSIS — Z515 Encounter for palliative care: Secondary | ICD-10-CM | POA: Diagnosis not present

## 2020-07-15 DIAGNOSIS — I2510 Atherosclerotic heart disease of native coronary artery without angina pectoris: Secondary | ICD-10-CM | POA: Insufficient documentation

## 2020-07-15 DIAGNOSIS — Z87442 Personal history of urinary calculi: Secondary | ICD-10-CM | POA: Insufficient documentation

## 2020-07-15 DIAGNOSIS — K219 Gastro-esophageal reflux disease without esophagitis: Secondary | ICD-10-CM | POA: Diagnosis not present

## 2020-07-15 DIAGNOSIS — R410 Disorientation, unspecified: Secondary | ICD-10-CM | POA: Insufficient documentation

## 2020-07-15 DIAGNOSIS — E1136 Type 2 diabetes mellitus with diabetic cataract: Secondary | ICD-10-CM | POA: Diagnosis not present

## 2020-07-15 DIAGNOSIS — R1319 Other dysphagia: Secondary | ICD-10-CM

## 2020-07-15 DIAGNOSIS — M1990 Unspecified osteoarthritis, unspecified site: Secondary | ICD-10-CM | POA: Insufficient documentation

## 2020-07-15 DIAGNOSIS — Z79899 Other long term (current) drug therapy: Secondary | ICD-10-CM | POA: Insufficient documentation

## 2020-07-15 DIAGNOSIS — M549 Dorsalgia, unspecified: Secondary | ICD-10-CM | POA: Diagnosis not present

## 2020-07-15 DIAGNOSIS — C7931 Secondary malignant neoplasm of brain: Secondary | ICD-10-CM | POA: Diagnosis not present

## 2020-07-15 DIAGNOSIS — E119 Type 2 diabetes mellitus without complications: Secondary | ICD-10-CM | POA: Insufficient documentation

## 2020-07-15 DIAGNOSIS — Z801 Family history of malignant neoplasm of trachea, bronchus and lung: Secondary | ICD-10-CM | POA: Insufficient documentation

## 2020-07-15 DIAGNOSIS — Z800 Family history of malignant neoplasm of digestive organs: Secondary | ICD-10-CM | POA: Diagnosis not present

## 2020-07-15 DIAGNOSIS — Z803 Family history of malignant neoplasm of breast: Secondary | ICD-10-CM | POA: Insufficient documentation

## 2020-07-15 DIAGNOSIS — Z95828 Presence of other vascular implants and grafts: Secondary | ICD-10-CM

## 2020-07-15 LAB — CBC WITH DIFFERENTIAL/PLATELET
Abs Immature Granulocytes: 0.02 10*3/uL (ref 0.00–0.07)
Basophils Absolute: 0 10*3/uL (ref 0.0–0.1)
Basophils Relative: 1 %
Eosinophils Absolute: 0.3 10*3/uL (ref 0.0–0.5)
Eosinophils Relative: 5 %
HCT: 36.8 % (ref 36.0–46.0)
Hemoglobin: 11.4 g/dL — ABNORMAL LOW (ref 12.0–15.0)
Immature Granulocytes: 0 %
Lymphocytes Relative: 23 %
Lymphs Abs: 1.2 10*3/uL (ref 0.7–4.0)
MCH: 25.8 pg — ABNORMAL LOW (ref 26.0–34.0)
MCHC: 31 g/dL (ref 30.0–36.0)
MCV: 83.3 fL (ref 80.0–100.0)
Monocytes Absolute: 0.4 10*3/uL (ref 0.1–1.0)
Monocytes Relative: 7 %
Neutro Abs: 3.5 10*3/uL (ref 1.7–7.7)
Neutrophils Relative %: 64 %
Platelets: 188 10*3/uL (ref 150–400)
RBC: 4.42 MIL/uL (ref 3.87–5.11)
RDW: 14.6 % (ref 11.5–15.5)
WBC: 5.4 10*3/uL (ref 4.0–10.5)
nRBC: 0 % (ref 0.0–0.2)

## 2020-07-15 LAB — COMPREHENSIVE METABOLIC PANEL
ALT: 12 U/L (ref 0–44)
AST: 17 U/L (ref 15–41)
Albumin: 3.6 g/dL (ref 3.5–5.0)
Alkaline Phosphatase: 76 U/L (ref 38–126)
Anion gap: 7 (ref 5–15)
BUN: 14 mg/dL (ref 8–23)
CO2: 24 mmol/L (ref 22–32)
Calcium: 8.2 mg/dL — ABNORMAL LOW (ref 8.9–10.3)
Chloride: 106 mmol/L (ref 98–111)
Creatinine, Ser: 1.03 mg/dL — ABNORMAL HIGH (ref 0.44–1.00)
GFR, Estimated: 56 mL/min — ABNORMAL LOW (ref >60)
Glucose, Bld: 115 mg/dL — ABNORMAL HIGH (ref 70–99)
Potassium: 4.8 mmol/L (ref 3.5–5.1)
Sodium: 137 mmol/L (ref 135–145)
Total Bilirubin: 0.4 mg/dL (ref 0.3–1.2)
Total Protein: 6.7 g/dL (ref 6.5–8.1)

## 2020-07-15 MED ORDER — SODIUM CHLORIDE 0.9% FLUSH
10.0000 mL | Freq: Once | INTRAVENOUS | Status: AC
  Filled 2020-07-15: qty 10

## 2020-07-15 MED ORDER — HEPARIN SOD (PORK) LOCK FLUSH 100 UNIT/ML IV SOLN
INTRAVENOUS | Status: AC
  Filled 2020-07-15: qty 5

## 2020-07-15 MED ORDER — HEPARIN SOD (PORK) LOCK FLUSH 100 UNIT/ML IV SOLN
500.0000 [IU] | Freq: Once | INTRAVENOUS | Status: AC
  Filled 2020-07-15: qty 5

## 2020-07-15 NOTE — Progress Notes (Signed)
[facility]  Telephone:([phone] [phone]   Name: Kimberly Huff Date: 07/15/2020 MRN: [id number]  DOB: Dec 26, 1945  Patient Care Team: [name], MD as PCP - General (Internal Medicine) [name], MD (General Surgery) [name], MD (Internal Medicine) Telford Nab, RN as Oncology Nurse Navigator [name], RPH-CPP (Pharmacist)    REASON FOR CONSULTATION: Kimberly Huff is a 75 y.o. 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  Patient was hospitalized 02/02/2020-02/05/2020 with weakness and slurred speech and was found to have 8 cm left lung mass in 2 brain lesions consistent with metastatic disease.  She has had pain and insomnia.  Palliative care was consulted to help address goals and manage ongoing symptoms.  SOCIAL HISTORY:     reports that she quit smoking about 21 years ago. Her smoking use included cigarettes. She has a 19.00 pack-year smoking history. She has never used smokeless tobacco. She reports previous alcohol use. She reports that she does not use drugs.  Patient is unmarried. She was at home alone. She has a daughter in [place], [place] and a son and [place], [place]. Patient retired from the [employer] and then later worked at [employer].   ADVANCE DIRECTIVES:  On file  CODE STATUS: DNR/DNI (DNR order signed on 03/18/20)  PAST MEDICAL HISTORY: Past Medical History:  Diagnosis Date  . Allergy   . Atrophic kidney    left  . Blood clot in vein 45 yrs ago   inabdoment after surgery  . Blood transfusion without reported diagnosis   . Cataract   . Complication of anesthesia   . Coronary artery disease   . Coronary atherosclerosis   . Diabetes mellitus   . Diverticulosis 2005    Dr Jamal Collin  . Dyspnea    with exertion  . External hemorrhoids without mention of complication 8003  . GERD (gastroesophageal reflux disease)   . H/O cardiac catheterization 2005  . Heart murmur 2012   found by Dr. Jamal Collin  . Hyperlipidemia   . Hypertension   . Kidney stone   . Lesion of bladder   . Myocardial infarction ([place]) 2000  . Neuromuscular disorder ([place])    "achy muscles"  . Obesity   . Osteoarthritis   . PONV (postoperative nausea and vomiting)   . Recurrent UTI   . Seizure ([place])   . Spinal stenosis of cervical region   . Steatohepatitis 05/30/10   Brunt Stage 3, non alcoholic cirrhosis of the liver  . Type II or unspecified type diabetes mellitus without mention of complication, uncontrolled    Patient does takes Metformin and recommended to stop for 48 hours.  Marland Kitchen Ulcer   . Unspecified essential hypertension 2014    PAST SURGICAL HISTORY:  Past Surgical History:  Procedure Laterality Date  . ABDOMINAL HYSTERECTOMY  1982   complete  . BREAST CYST ASPIRATION Right 1990's   benign  . BREAST MASS EXCISION     benign  . CARDIAC CATHETERIZATION  2000   cardiac stent  . cardiac stents  2000  . CATARACT EXTRACTION     both eyes  . COLONOSCOPY  2004, 2015   Dr. Jamal Collin, Dr. Olevia Perches  . CYSTOSCOPY W/ RETROGRADES Left 11/12/2015   Procedure: CYSTOSCOPY WITH RETROGRADE PYELOGRAM;  Surgeon: Candee Furbish  McKenzie, MD;  Location: [facility];  Service: Urology;  Laterality: Left;  . CYSTOSCOPY W/ URETERAL STENT PLACEMENT Left 11/12/2015   Procedure: CYSTOSCOPY WITH STENT REPLACEMENT;  Surgeon: Cleon Gustin, MD;  Location: [facility];  Service: Urology;  Laterality: Left;  . CYSTOSCOPY WITH BIOPSY N/A 09/26/2019   Procedure: CYSTOSCOPY WITH BIOPSY AND FULGURATION;  Surgeon: Cleon Gustin, MD;  [facility];  Service: Urology;  Laterality: N/A;  . CYSTOSCOPY WITH URETEROSCOPY AND STENT PLACEMENT Left 10/15/2015   Procedure: CYSTOSCOPY WITH URETEROSCOPY  AND STENT PLACEMENT;  Surgeon: Cleon Gustin, MD;  Location: [facility];  Service: Urology;  Laterality: Left;  . EYE SURGERY Bilateral 2012   cataract  . FOOT SURGERY  2008  . IR IMAGING GUIDED PORT INSERTION  05/23/2020  . PTCA  2000   stent x 1  . ROBOTIC ASSITED PARTIAL NEPHRECTOMY Left 06/02/2019   Procedure: XI ROBOTIC ASSITED NEPHRECTOMY;  Surgeon: Cleon Gustin, MD;  Location: [facility];  Service: Urology;  Laterality: Left;  . SALPINGOOPHORECTOMY    . TONSILLECTOMY    . UPPER GI ENDOSCOPY    . URETEROSCOPY WITH HOLMIUM LASER LITHOTRIPSY Left 11/12/2015   Procedure: URETEROSCOPY WITH HOLMIUM LASER LITHOTRIPSY;  Surgeon: Cleon Gustin, MD;  Location: [facility];  Service: Urology;  Laterality: Left;  . URETEROTOMY  1960/1969   x 2 during childhood  . VAGINAL HYSTERECTOMY    . VIDEO BRONCHOSCOPY WITH ENDOBRONCHIAL NAVIGATION N/A 02/21/2020   Procedure: VIDEO BRONCHOSCOPY WITH ENDOBRONCHIAL NAVIGATION;  Surgeon: Ottie Glazier, MD;  Location: [facility];  Service: Thoracic;  Laterality: N/A;  . VIDEO BRONCHOSCOPY WITH ENDOBRONCHIAL ULTRASOUND N/A 02/21/2020   Procedure: VIDEO BRONCHOSCOPY WITH ENDOBRONCHIAL ULTRASOUND;  Surgeon: Ottie Glazier, MD;  Location: [facility];  Service: Thoracic;  Laterality: N/A;    HEMATOLOGY/ONCOLOGY HISTORY:  Oncology History Overview Note  # OCT 2021-left upper lobe lung mass invading mediastinum; bronc/Bx-  Oct 20th [Dr.Aleskerov]; non-small cell carcinoma/adenocarcinoma.  Stage IV [brain metastases]; NGS-QNS; PDL-1- 95%.  #2021-PET scan left upper lobe mass; presacral question benign mass; no distant site disease  #Right frontal/temporal brain met x2 [1-1.85m]-seizure- on dex; Keppra; SBRT s/p Oct 13th, 2021  # 11/18-RT to LUL ca [? hemoptysis] s/p RT [until jan 11th, 2022]  # 2021- incidental-presacral mass-without significant SUV uptake suspect-benign etiology monitor for now.  # DM on OHA; HTN; SOLITARY KIDNEY [frequent infections/  atrophic- s/p nephrectomy 2020]; colonization-multiple drug resistance [s/p ID- Dr.Ravishankar- DEC 2021]  # NGS/MOLECULAR TESTS:NGS-QNS; guardant testing-inconclusive; PDL-1- 95%.     # PALLIATIVE CARE EVALUATION:03/18/2020  # PAIN MANAGEMENT:   DIAGNOSIS: Lung cancer  STAGE:   IV      ;  GOALS: Palliative  CURRENT/MOST RECENT THERAPY : RT    Brain metastasis ([facility]  02/02/2020 Initial Diagnosis   Brain metastasis ([facility]   Primary cancer of left upper lobe of lung ([facility]  02/26/2020 Initial Diagnosis   Primary cancer of left upper lobe of lung ([facility]   05/14/2020 Cancer Staging   Staging form: Lung, AJCC 8th Edition - Clinical: Stage IVB (cT3, pM1c) - Signed by BCammie Sickle MD on 05/14/2020     ALLERGIES:  has No Known Allergies.  MEDICATIONS:  Current Outpatient Medications  Medication Sig Dispense Refill  . acetaminophen (TYLENOL) 500 MG tablet Take 500 mg by mouth every 6 (six) hours as needed.    . colchicine 0.6 MG tablet Take 1 tablet (0.6 mg total) by mouth 2 (two) times daily. (Patient not  taking: Reported on 07/15/2020) 20 tablet 0  . Continuous Blood Gluc Receiver (FREESTYLE LIBRE 14 DAY READER) DEVI Use to check BS 4 times daily 2 each 2  . Continuous Blood Gluc Sensor (FREESTYLE LIBRE 14 DAY SENSOR) MISC Use to check BS 4 times daily 6 each 3  . denosumab (PROLIA) 60 MG/ML SOSY injection Inject into the skin.    . furosemide (LASIX) 40 MG tablet TAKE ONE TABLET EVERY DAY (Patient not taking: Reported on 07/15/2020) 90 tablet 1  . Insulin Pen Needle 32G X 6 MM MISC 1 Stick by Does not apply route daily. 100 each 1  . LANTUS SOLOSTAR 100 UNIT/ML Solostar Pen INJECT 20 UNITS INTO THE SKIN DAILY AS DIRECTED (Patient taking differently: Inject 15 Units into the skin daily.) 15 mL PRN  . levETIRAcetam (KEPPRA) 500 MG tablet Take 1 tablet (500 mg total) by mouth 2 (two) times daily. 60 tablet 2  . lidocaine-prilocaine (EMLA) cream Apply 1 application topically as  needed. 30-45 mins prior to port access. 30 g 0  . losartan (COZAAR) 50 MG tablet Take 1 tablet (50 mg total) by mouth daily. 90 tablet 3  . morphine (MS CONTIN) 15 MG 12 hr tablet Take 1 tablet (15 mg total) by mouth every 12 (twelve) hours. 60 tablet 0  . naloxone (NARCAN) nasal spray 4 mg/0.1 mL 1 spray into nostril upon signs of opioid overdose. Call 911. May repeat once if no response within 2-3 minutes. (Patient not taking: No sig reported) 1 each 0  . omeprazole (PRILOSEC) 40 MG capsule TAKE 1 CAPSULE BY MOUTH ONCE DAILY 90 capsule 1  . ondansetron (ZOFRAN) 8 MG tablet One pill every 8 hours as needed for nausea/vomitting. 90 tablet 1  . ONETOUCH ULTRA test strip USE AS INSTRUCTED 100 each 12  . Oxycodone HCl 10 MG TABS Take 1 tablet (10 mg total) by mouth every 4 (four) hours as needed. 90 tablet 0  . prochlorperazine (COMPAZINE) 10 MG tablet Take 1 tablet (10 mg total) by mouth every 6 (six) hours as needed for nausea or vomiting. (Patient not taking: Reported on 07/15/2020) 40 tablet 1  . rosuvastatin (CRESTOR) 10 MG tablet TAKE ONE TABLET EVERY DAY 90 tablet 1  . sucralfate (CARAFATE) 1 g tablet Take 0.5 tablets (0.5 g total) by mouth 3 (three) times daily. Dissolve in 3-4 tbsp warm water, swallow. (Patient not taking: No sig reported) 60 tablet 1  . triamcinolone ointment (KENALOG) 0.5 % Apply 1 application topically 2 (two) times daily. (Patient not taking: No sig reported) 30 g 0  . trimethoprim (TRIMPEX) 100 MG tablet Take 1 tablet (100 mg total) by mouth at bedtime. 30 tablet 11   No current facility-administered medications for this visit.   Facility-Administered Medications Ordered in Other Visits  Medication Dose Route Frequency Provider Last Rate Last Admin  . heparin lock flush 100 unit/mL  500 Units Intravenous Once Charlaine Dalton R, MD      . sodium chloride flush (NS) 0.9 % injection 10 mL  10 mL Intravenous Once [name], MD        VITAL SIGNS: There  were no vitals taken for this visit. There were no vitals filed for this visit.  Estimated body mass index is 29.94 kg/m as calculated from the following:   Height as of 07/01/20: 5' 3"  (1.6 m).   Weight as of an earlier encounter on 07/15/20: 169 lb (76.7 kg).  LABS: CBC:    Component Value  Date/Time   WBC 5.4 07/15/2020 0916   HGB 11.4 (L) 07/15/2020 0916   HCT 36.8 07/15/2020 0916   PLT 188 07/15/2020 0916   MCV 83.3 07/15/2020 0916   NEUTROABS 3.5 07/15/2020 0916   LYMPHSABS 1.2 07/15/2020 0916   MONOABS 0.4 07/15/2020 0916   EOSABS 0.3 07/15/2020 0916   BASOSABS 0.0 07/15/2020 0916   Comprehensive Metabolic Panel:    Component Value Date/Time   NA 137 07/15/2020 0916   NA 140 12/15/2012 0000   K 4.8 07/15/2020 0916   CL 106 07/15/2020 0916   CO2 24 07/15/2020 0916   BUN 14 07/15/2020 0916   BUN 22 (A) 12/15/2012 0000   CREATININE 1.03 (H) 07/15/2020 0916   CREATININE 0.85 12/23/2018 1428   GLUCOSE 115 (H) 07/15/2020 0916   CALCIUM 8.2 (L) 07/15/2020 0916   AST 17 07/15/2020 0916   ALT 12 07/15/2020 0916   ALKPHOS 76 07/15/2020 0916   BILITOT 0.4 07/15/2020 0916   PROT 6.7 07/15/2020 0916   ALBUMIN 3.6 07/15/2020 0916    RADIOGRAPHIC STUDIES: No results found.  PERFORMANCE STATUS (ECOG) : 1 - Symptomatic but completely ambulatory  Review of Systems Unless otherwise noted, a complete review of systems is negative.  Physical Exam General: NAD Pulmonary: Unlabored Extremities: no edema, no joint deformities Skin: Erythematous rash upper back Neurological: Weakness but otherwise nonfocal  IMPRESSION: Routine follow-up visit.  Patient reports that she is doing "great".  She denies any significant changes or concerns.  No severe symptomatic complaints today.  Patient did complain of some dysphagia to Dr. Rogue Bussing but did not mention it to me.  He was referring her for an upper GI study.  Likely dysphagia secondary to radiation esophagitis.  Could consider  referral to ST if needed.   Patient reports stable pain.  Continue oxycodone and MS Contin.  She says performance status is improved after meeting with OT.  Patient plans to meet with her sons in [place] to discuss end-of-life planning.  I sent her home with a MOST form if she is interested in completing it.  Patient is a DNR/DNI.  PLAN: -Continue current scope of treatment -Continue MS Contin 15 mg every 12 hours -Continue oxycodone 10 mg every 4 hours as needed for breakthrough pain -Daily bowel regimen -RTC 1 month  Case and plan discussed with Dr. Rogue Bussing  Patient expressed understanding and was in agreement with this plan. She also understands that She can call the clinic at any time with any questions, concerns, or complaints.     Time Total: 15 minutes  Visit consisted of counseling and education dealing with the complex and emotionally intense issues of symptom management and palliative care in the setting of serious and potentially life-threatening illness.Greater than 50%  of this time was spent counseling and coordinating care related to the above assessment and plan.  Signed by: Altha Harm, PhD, NP-C

## 2020-07-15 NOTE — Progress Notes (Signed)
[facility] CONSULT NOTE  Patient Care Team: [name], MD as PCP - General (Internal Medicine) [name], MD (General Surgery) [name], MD (Internal Medicine) Telford Nab, RN as Oncology Nurse Navigator [name], RPH-CPP (Pharmacist)  CHIEF COMPLAINTS/PURPOSE OF CONSULTATION:  Lung cancer/Brain metastases  #  Oncology History Overview Note  # OCT 2021-left upper lobe lung mass invading mediastinum; bronc/Bx-  Oct 20th [Dr.Aleskerov]; non-small cell carcinoma/adenocarcinoma.  Stage IV [brain metastases]; NGS-QNS; PDL-1- 95%.  #2021-PET scan left upper lobe mass; presacral question benign mass; no distant site disease  #Right frontal/temporal brain met x2 [1-1.38m]-seizure- on dex; Keppra; SBRT s/p Oct 13th, 2021  # 11/18-RT to LUL ca [? hemoptysis] s/p RT [until jan 11th, 2022]  # 2021- incidental-presacral mass-without significant SUV uptake suspect-benign etiology monitor for now.  # DM on OHA; HTN; SOLITARY KIDNEY [frequent infections/ atrophic- s/p nephrectomy 2020]; colonization-multiple drug resistance [s/p ID- Dr.Ravishankar- DEC 2021]  # NGS/MOLECULAR TESTS:NGS-QNS; guardant testing-inconclusive; PDL-1- 95%.     # PALLIATIVE CARE EVALUATION:03/18/2020  # PAIN MANAGEMENT:   DIAGNOSIS: Lung cancer  STAGE:   IV      ;  GOALS: Palliative  CURRENT/MOST RECENT THERAPY : RT    Brain metastasis ([facility]  02/02/2020 Initial Diagnosis   Brain metastasis (H[facility])   Primary cancer of left upper lobe of lung ([facility]  02/26/2020 Initial Diagnosis   [place] lobe of lung ([facility]   05/14/2020 Cancer Staging   Staging form: Lung, AJCC 8th Edition - Clinical: Stage IVB (cT3, pM1c) - Signed by BCammie Sickle MD on 05/14/2020    HISTORY OF PRESENTING ILLNESS:  Kimberly Crazier89y.o.  female  patient with a history of solitary kidney; diabetes obesity; prior history of smoking; in left upper lobe lung mass;  brain metastasis status post SBRT; S/p RT to LUL cancer is here for follow-up.  Patient notes to have intermittent difficulty swallowing/regurgitation burping with both solids and liquids.  Complains of mild neck pain/headaches.  Positional.  Not constant.  No nausea vomiting.  She continues have fever up to 99-100 almost every day in the afternoons.  Associated with sweating.-Improved with Tylenol.  Status post physical therapy-Maureen able to walk better.  No falls.  Review of Systems  Constitutional: Positive for fever and malaise/fatigue. Negative for chills, diaphoresis and weight loss.  HENT: Negative for nosebleeds and sore throat.   Eyes: Negative for double vision.  Respiratory: Negative for cough, sputum production and wheezing.   Cardiovascular: Negative for chest pain, palpitations and orthopnea.  Gastrointestinal: Negative for abdominal pain, blood in stool, constipation, diarrhea, heartburn, melena, nausea and vomiting.  Genitourinary: Negative for dysuria, frequency and urgency.  Musculoskeletal: Positive for back pain and joint pain.  Skin: Negative.  Negative for itching and rash.  Neurological: Negative for dizziness, tingling, focal weakness, weakness and headaches.  Endo/Heme/Allergies: Does not bruise/bleed easily.  Psychiatric/Behavioral: Negative for depression. The patient is not nervous/anxious and does not have insomnia.      MEDICAL HISTORY:  Past Medical History:  Diagnosis Date  . Allergy   . Atrophic kidney    left  . Blood clot in vein 45 yrs ago   inabdoment after surgery  . Blood transfusion without reported diagnosis   . Cataract   . Complication of anesthesia   . Coronary artery disease   . Coronary atherosclerosis   . Diabetes mellitus   . Diverticulosis 2005   Dr SJamal Collin .  Dyspnea    with exertion  . External hemorrhoids without mention of complication 5784  . GERD (gastroesophageal reflux disease)   . H/O cardiac catheterization 2005   . Heart murmur 2012   found by Dr. Jamal Collin  . Hyperlipidemia   . Hypertension   . Kidney stone   . Lesion of bladder   . Myocardial infarction ([place]) 2000  . Neuromuscular disorder ([place])    "achy muscles"  . Obesity   . Osteoarthritis   . PONV (postoperative nausea and vomiting)   . Recurrent UTI   . Seizure ([place])   . Spinal stenosis of cervical region   . Steatohepatitis 05/30/10   Brunt Stage 3, non alcoholic cirrhosis of the liver  . Type II or unspecified type diabetes mellitus without mention of complication, uncontrolled    Patient does takes Metformin and recommended to stop for 48 hours.  Marland Kitchen Ulcer   . Unspecified essential hypertension 2014    SURGICAL HISTORY: Past Surgical History:  Procedure Laterality Date  . ABDOMINAL HYSTERECTOMY  1982   complete  . BREAST CYST ASPIRATION Right 1990's   benign  . BREAST MASS EXCISION     benign  . CARDIAC CATHETERIZATION  2000   cardiac stent  . cardiac stents  2000  . CATARACT EXTRACTION     both eyes  . COLONOSCOPY  2004, 2015   Dr. Jamal Collin, Dr. Olevia Perches  . CYSTOSCOPY W/ RETROGRADES Left 11/12/2015   Procedure: CYSTOSCOPY WITH RETROGRADE PYELOGRAM;  Surgeon: Cleon Gustin, MD;  Location: [facility];  Service: Urology;  Laterality: Left;  . CYSTOSCOPY W/ URETERAL STENT PLACEMENT Left 11/12/2015   Procedure: CYSTOSCOPY WITH STENT REPLACEMENT;  Surgeon: Cleon Gustin, MD;  Location: [facility];  Service: Urology;  Laterality: Left;  . CYSTOSCOPY WITH BIOPSY N/A 09/26/2019   Procedure: CYSTOSCOPY WITH BIOPSY AND FULGURATION;  Surgeon: Cleon Gustin, MD;  Location[facility];  Service: Urology;  Laterality: N/A;  . CYSTOSCOPY WITH URETEROSCOPY AND STENT PLACEMENT Left 10/15/2015   Procedure: CYSTOSCOPY WITH URETEROSCOPY AND STENT PLACEMENT;  Surgeon: Cleon Gustin, MD;  Location: [facility];  Service: Urology;  Laterality: Left;  . EYE SURGERY Bilateral 2012   cataract  . FOOT SURGERY  2008  . IR  IMAGING GUIDED PORT INSERTION  05/23/2020  . PTCA  2000   stent x 1  . ROBOTIC ASSITED PARTIAL NEPHRECTOMY Left 06/02/2019   Procedure: XI ROBOTIC ASSITED NEPHRECTOMY;  Surgeon: Cleon Gustin, MD;  Location: [facility];  Service: Urology;  Laterality: Left;  . SALPINGOOPHORECTOMY    . TONSILLECTOMY    . UPPER GI ENDOSCOPY    . URETEROSCOPY WITH HOLMIUM LASER LITHOTRIPSY Left 11/12/2015   Procedure: URETEROSCOPY WITH HOLMIUM LASER LITHOTRIPSY;  Surgeon: Cleon Gustin, MD;  Location: [facility];  Service: Urology;  Laterality: Left;  . URETEROTOMY  1960/1969   x 2 during childhood  . VAGINAL HYSTERECTOMY    . VIDEO BRONCHOSCOPY WITH ENDOBRONCHIAL NAVIGATION N/A 02/21/2020   Procedure: VIDEO BRONCHOSCOPY WITH ENDOBRONCHIAL NAVIGATION;  Surgeon: Ottie Glazier, MD;  Location: [facility];  Service: Thoracic;  Laterality: N/A;  . VIDEO BRONCHOSCOPY WITH ENDOBRONCHIAL ULTRASOUND N/A 02/21/2020   Procedure: VIDEO BRONCHOSCOPY WITH ENDOBRONCHIAL ULTRASOUND;  Surgeon: Ottie Glazier, MD;  Location: [facility];  Service: Thoracic;  Laterality: N/A;    SOCIAL HISTORY: Social History   Socioeconomic History  . Marital status: Single    Spouse name: Not on file  . Number of children: 2  .  Years of education: Not on file  . Highest education level: Not on file  Occupational History  . 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 Smoking status: Former Smoker    Packs/day: 0.50    Years: 38.00    Pack years: 19.00    Types: Cigarettes    Quit date: 09/15/1998    Years since quitting: 21.8  . Smokeless tobacco: Never Used  Vaping Use  . Vaping Use: Never used  Substance and Sexual Activity  . Alcohol use: Not Currently  . Drug use: No  . Sexual activity: Not Currently  Other Topics Concern  . Not on file  Social History Narrative   Lives in [place]; daughter lives in [place]. Quit smoking may 14th 2000. No alcohol. Retired  From [employer]  job.    Social Determinants of Health   Financial Resource Strain: Low Risk   . Difficulty of Paying Living Expenses: Not hard at all  Food Insecurity: No Food Insecurity  . Worried About Charity fundraiser in the Last Year: Never true  . Ran Out of Food in the Last Year: Never true  Transportation Needs: No Transportation Needs  . Lack of Transportation (Medical): No  . Lack of Transportation (Non-Medical): No  Physical Activity: Not on file  Stress: No Stress Concern Present  . Feeling of Stress : Only a little  Social Connections: Unknown  . Frequency of Communication with Friends and Family: More than three times a week  . Frequency of Social Gatherings with Friends and Family: More than three times a week  . Attends Religious Services: More than 4 times per year  . Active Member of Clubs or Organizations: Yes  . Attends Archivist Meetings: More than 4 times per year  . Marital Status: Not on file  Intimate Partner Violence: Not At Risk  . Fear of Current or Ex-Partner: No  . Emotionally Abused: No  . Physically Abused: No  . Sexually Abused: No    FAMILY HISTORY: Family History  Problem Relation Age of Onset  . Lung cancer Father   . Heart disease Father   . Diverticulosis Mother   . Diabetes Mother   . Stroke Mother 87  . Autoimmune disease Daughter   . Thyroid disease Daughter   . Thyroid disease Sister   . Stomach cancer Maternal Aunt   . Breast cancer Maternal Aunt   . Colon cancer Neg Hx   . Esophageal cancer Neg Hx   . Rectal cancer Neg Hx     ALLERGIES:  has No Known Allergies.  MEDICATIONS:  Current Outpatient Medications  Medication Sig Dispense Refill  . acetaminophen (TYLENOL) 500 MG tablet Take 500 mg by mouth every 6 (six) hours as needed.    . Continuous Blood Gluc Receiver (FREESTYLE LIBRE 14 DAY READER) DEVI Use to check BS 4 times daily 2 each 2  . Continuous Blood Gluc Sensor (FREESTYLE LIBRE 14 DAY SENSOR) MISC Use to check BS  4 times daily 6 each 3  . denosumab (PROLIA) 60 MG/ML SOSY injection Inject into the skin.    . Insulin Pen Needle 32G X 6 MM MISC 1 Stick by Does not apply route daily. 100 each 1  . LANTUS SOLOSTAR 100 UNIT/ML Solostar Pen INJECT 20 UNITS INTO THE SKIN DAILY AS DIRECTED (Patient taking differently: Inject 15 Units into the skin daily.) 15 mL PRN  . levETIRAcetam (KEPPRA) 500 MG tablet Take  1 tablet (500 mg total) by mouth 2 (two) times daily. 60 tablet 2  . lidocaine-prilocaine (EMLA) cream Apply 1 application topically as needed. 30-45 mins prior to port access. 30 g 0  . losartan (COZAAR) 50 MG tablet Take 1 tablet (50 mg total) by mouth daily. 90 tablet 3  . morphine (MS CONTIN) 15 MG 12 hr tablet Take 1 tablet (15 mg total) by mouth every 12 (twelve) hours. 60 tablet 0  . omeprazole (PRILOSEC) 40 MG capsule TAKE 1 CAPSULE BY MOUTH ONCE DAILY 90 capsule 1  . ondansetron (ZOFRAN) 8 MG tablet One pill every 8 hours as needed for nausea/vomitting. 90 tablet 1  . ONETOUCH ULTRA test strip USE AS INSTRUCTED 100 each 12  . Oxycodone HCl 10 MG TABS Take 1 tablet (10 mg total) by mouth every 4 (four) hours as needed. 90 tablet 0  . rosuvastatin (CRESTOR) 10 MG tablet TAKE ONE TABLET EVERY DAY 90 tablet 1  . trimethoprim (TRIMPEX) 100 MG tablet Take 1 tablet (100 mg total) by mouth at bedtime. 30 tablet 11  . colchicine 0.6 MG tablet Take 1 tablet (0.6 mg total) by mouth 2 (two) times daily. (Patient not taking: Reported on 07/15/2020) 20 tablet 0  . furosemide (LASIX) 40 MG tablet TAKE ONE TABLET EVERY DAY (Patient not taking: Reported on 07/15/2020) 90 tablet 1  . naloxone (NARCAN) nasal spray 4 mg/0.1 mL 1 spray into nostril upon signs of opioid overdose. Call 911. May repeat once if no response within 2-3 minutes. (Patient not taking: No sig reported) 1 each 0  . prochlorperazine (COMPAZINE) 10 MG tablet Take 1 tablet (10 mg total) by mouth every 6 (six) hours as needed for nausea or vomiting.  (Patient not taking: Reported on 07/15/2020) 40 tablet 1  . sucralfate (CARAFATE) 1 g tablet Take 0.5 tablets (0.5 g total) by mouth 3 (three) times daily. Dissolve in 3-4 tbsp warm water, swallow. (Patient not taking: No sig reported) 60 tablet 1  . triamcinolone ointment (KENALOG) 0.5 % Apply 1 application topically 2 (two) times daily. (Patient not taking: No sig reported) 30 g 0   No current facility-administered medications for this visit.   Facility-Administered Medications Ordered in Other Visits  Medication Dose Route Frequency Provider Last Rate Last Admin  . heparin lock flush 100 unit/mL  500 Units Intravenous Once Charlaine Dalton R, MD      . sodium chloride flush (NS) 0.9 % injection 10 mL  10 mL Intravenous Once Charlaine Dalton R, MD          .  PHYSICAL EXAMINATION: ECOG PERFORMANCE STATUS: 1 - Symptomatic but completely ambulatory  Vitals:   07/15/20 0945  BP: 119/79  Pulse: 77  Temp: 98.3 F (36.8 C)  SpO2: 96%   Filed Weights   07/15/20 0945  Weight: 169 lb (76.7 kg)    Physical Exam Vitals and nursing note reviewed.  Constitutional:      Comments: Patient came by daughter.  She is walking independently.  HENT:     Head: Normocephalic and atraumatic.     Mouth/Throat:     Pharynx: No oropharyngeal exudate.  Eyes:     Pupils: Pupils are equal, round, and reactive to light.  Cardiovascular:     Rate and Rhythm: Normal rate and regular rhythm.  Pulmonary:     Effort: No respiratory distress.     Breath sounds: No wheezing.     Comments: Decreased air entry bilaterally left more than right. Abdominal:  General: Bowel sounds are normal. There is no distension.     Palpations: Abdomen is soft. There is no mass.     Tenderness: There is no abdominal tenderness. There is no guarding or rebound.  Musculoskeletal:        General: No tenderness. Normal range of motion.     Cervical back: Normal range of motion and neck supple.  Skin:    General:  Skin is warm.  Neurological:     Mental Status: She is alert and oriented to person, place, and time.  Psychiatric:        Mood and Affect: Affect normal.      LABORATORY DATA:  I have reviewed the data as listed Lab Results  Component Value Date   WBC 5.4 07/15/2020   HGB 11.4 (L) 07/15/2020   HCT 36.8 07/15/2020   MCV 83.3 07/15/2020   PLT 188 07/15/2020   Recent Labs    02/02/20 0925 02/03/20 0541 02/09/20 1002 05/14/20 0843 06/11/20 0946 07/15/20 0916  NA 137 133*   < > 137 137 137  K 4.2 4.3   < > 4.7 4.0 4.8  CL 100 99   < > 102 102 106  CO2 27 24   < > 26 25 24   GLUCOSE 177* 248*   < > 131* 130* 115*  BUN 16 18   < > 11 10 14   CREATININE 0.81 0.78   < > 0.78 0.79 1.03*  CALCIUM 9.6 9.2   < > 9.4 9.1 8.2*  GFRNONAA >60 >60   < > >60 >60 56*  GFRAA >60 >60  --   --   --   --   PROT 7.7  --    < > 7.0 6.5 6.7  ALBUMIN 4.5  --    < > 3.3* 3.3* 3.6  AST 29  --    < > 17 15 17   ALT 31  --    < > 9 10 12   ALKPHOS 43  --    < > 50 63 76  BILITOT 0.9  --    < > 0.6 0.5 0.4   < > = values in this interval not displayed.    RADIOGRAPHIC STUDIES: I have personally reviewed the radiological images as listed and agreed with the findings in the report. No results found.  ASSESSMENT & PLAN:   Primary cancer of left upper lobe of lung ([place]) #Non-small cell lung cancer/adenocarcinoma- s/p palliative radiation left upper lobe  [jan 18th, 2022] FEB 9th, 2022- PET scan- NO DISTANT METASTATIC DISEASE; improved left upper lobe mass SUV; left upper lobe radiation pneumonitis changes.  #Given the absence of progressive disease/or worsening clinical symptoms [see below]-recommend continued surveillance.  We will plan imaging next visit.  However if symptoms worse would recommend immunotherapy.  Discussed with [employer].  # ? Tumor fevers/sweating- every day [99 or 100.1]- improves with Tylenol prn.  If worse would recommend therapy.  # ? Difficulty in swallowing  solids/liquids-question radiation-induced.  Recommend DG esophagogram.  If stricture noted would recommend evaluation with GI.  #Brain metastases/edema s/p SBRT 10/13; -Continue Keppra.STABLE.   #Back pain likely degenerative- hydrocodone q 12 hour; MS contin 15 mg BID; STABLE.   # Solitary kidney-normal renal function- STABLE; MDR- colonization- s/p ID evaluation. Defer to Urology.   # Hypocalcemia-recommend vit D 1000 unit/day.   #Gait instability/fatigue-recommend evaluation with [facility] occupational therapy.  # IV access- s/p port placement  # DISPOSITION: # Dg esophagogram  ASAP # follow up in 1 month- MD; labs-port flush- cbc/cmp-Dr.B     All questions were answered. The patient knows to call the clinic with any problems, questions or concerns.    [name], MD 07/15/2020 10:42 AM

## 2020-07-15 NOTE — Assessment & Plan Note (Addendum)
#  Non-small cell lung cancer/adenocarcinoma- s/p palliative radiation left upper lobe  [jan 18th, 2022] FEB 9th, 2022- PET scan- NO DISTANT METASTATIC DISEASE; improved left upper lobe mass SUV; left upper lobe radiation pneumonitis changes.  #Given the absence of progressive disease/or worsening clinical symptoms [see below]-recommend continued surveillance.  We will plan imaging next visit.  However if symptoms worse would recommend immunotherapy.  Discussed with [employer].  # ? Tumor fevers/sweating- every day [99 or 100.1]- improves with Tylenol prn.  If worse would recommend therapy.  # ? Difficulty in swallowing solids/liquids-question radiation-induced.  Recommend DG esophagogram.  If stricture noted would recommend evaluation with GI.  #Brain metastases/edema s/p SBRT 10/13; -Continue Keppra.STABLE.   #Back pain likely degenerative- hydrocodone q 12 hour; MS contin 15 mg BID; STABLE.   # Solitary kidney-normal renal function- STABLE; MDR- colonization- s/p ID evaluation. Defer to Urology.   # Hypocalcemia-recommend vit D 1000 unit/day.   #Gait instability/fatigue-recommend evaluation with [facility] Greet occupational therapy.  # IV access- s/p port placement  # DISPOSITION: # Dg esophagogram ASAP # follow up in 1 month- MD; labs-port flush- cbc/cmp-Dr.B

## 2020-07-15 NOTE — Progress Notes (Signed)
  Oncology Nurse Navigator Documentation  Navigator Location: [facility]-Med Onc (07/15/20 1300)   )Navigator Encounter Type: Appt/Treatment Plan Review (07/15/20 1300)                     Patient Visit Type: MedOnc (07/15/20 1300) Treatment Phase: Post-Tx Follow-up (07/15/20 1300) Barriers/Navigation Needs: No Barriers At This Time (07/15/20 1300)   Interventions: None Required (07/15/20 1300)

## 2020-07-15 NOTE — Patient Instructions (Signed)
#   Take 1 vit D 1000 unit/day.

## 2020-07-17 ENCOUNTER — Inpatient Hospital Stay: Payer: PPO | Admitting: Occupational Therapy

## 2020-07-18 ENCOUNTER — Other Ambulatory Visit: Payer: Self-pay | Admitting: Adult Health

## 2020-07-18 ENCOUNTER — Telehealth: Payer: Self-pay | Admitting: Hospice and Palliative Medicine

## 2020-07-18 ENCOUNTER — Telehealth: Payer: Self-pay | Admitting: Adult Health

## 2020-07-18 DIAGNOSIS — C7931 Secondary malignant neoplasm of brain: Secondary | ICD-10-CM

## 2020-07-18 MED ORDER — MORPHINE SULFATE ER 15 MG PO TBCR: 15.0000 mg | EXTENDED_RELEASE_TABLET | Freq: Three times a day (TID) | ORAL | 0 refills | Status: DC

## 2020-07-18 MED ORDER — OXYCODONE HCL 15 MG PO TABS
15.0000 mg | ORAL_TABLET | ORAL | 0 refills | Status: DC | PRN
Start: 2020-07-18 — End: 2020-07-29

## 2020-07-18 NOTE — Telephone Encounter (Signed)
Called patient about Evusheld injection to review per referral from Dr. Rogue Bussing.  Call back number given.    Wilber Bihari, NP

## 2020-07-18 NOTE — Telephone Encounter (Signed)
Daughter called to tell me that patient has been having worse generalized pain over the past couple weeks.  She says patient was reluctant to tell us at the clinic visit.  However, she describes the pain being particularly severe at night often interfering with patient sleep.  Daughter has been giving oxycodone 35m (two 178mtablets) every 4 hours around-the-clock.  This is still poorly controlling the pain.  No adverse effects reported from pain medications.  Plan: -Increase MS Contin 15 mg every 8 (#90) -Increase oxycodone 15-30 mg every 4 hours as needed for breakthrough pain (#60) -Continue daily bowel regimen

## 2020-07-18 NOTE — Progress Notes (Signed)
I connected by phone with Kimberly Huff on 07/18/2020, 3:23 PM to discuss the potential use of a new treatment, tixagevimab/cilgavimab, for pre-exposure prophylaxis for prevention of coronavirus disease 2019 (COVID-19) caused by the SARS-CoV-2 virus.  This patient is a 75 y.o. female that meets the FDA criteria for Emergency Use Authorization of tixagevimab/cilgavimab for pre-exposure prophylaxis of COVID-19 disease. Pt meets following criteria:  Age >12 yr and weight > 40kg  Not currently infected with SARS-CoV-2 and has no known recent exposure to an individual infected with SARS-CoV-2 AND o Who has moderate to severe immune compromise due to a medical condition or receipt of immunosuppressive medications or treatments and may not mount an adequate immune response to COVID-19 vaccination or  o Vaccination with any available COVID-19 vaccine, according to the approved or authorized schedule, is not recommended due to a history of severe adverse reaction (e.g., severe allergic reaction) to a COVID-19 vaccine(s) and/or COVID-19 vaccine component(s).  o Patient meets the following definition of mod-severe immune compromised status: 8. Other groups not previously mentioned: 1. Prednisone of 79m or higher for greater than 2 weeks--discussed with Dr. BRogue Bussing I have spoken and communicated the following to the patient or parent/caregiver regarding COVID monoclonal antibody treatment:  1. FDA has authorized the emergency use of tixagevimab/cilgavimab for the pre-exposure prophylaxis of COVID-19 in patients with moderate-severe immunocompromised status, who meet above EUA criteria.  2. The significant known and potential risks and benefits of COVID monoclonal antibody, and the extent to which such potential risks and benefits are unknown.  3. Information on available alternative treatments and the risks and benefits of those alternatives, including clinical trials.  4. The patient or parent/caregiver  has the option to accept or refuse COVID monoclonal antibody treatment.  After reviewing this information with the patient and her HCPOA [name] agree to receive tixagevimab/cilgavimab and I have sent a high priority scheduling message for her to get in for the injection.    LScot Dock NP, 07/18/2020, 3:23 PM

## 2020-07-22 ENCOUNTER — Other Ambulatory Visit: Payer: Self-pay | Admitting: *Deleted

## 2020-07-22 ENCOUNTER — Inpatient Hospital Stay: Payer: PPO

## 2020-07-22 ENCOUNTER — Ambulatory Visit: Payer: PPO

## 2020-07-22 ENCOUNTER — Telehealth: Payer: Self-pay | Admitting: Hospice and Palliative Medicine

## 2020-07-22 ENCOUNTER — Inpatient Hospital Stay (HOSPITAL_BASED_OUTPATIENT_CLINIC_OR_DEPARTMENT_OTHER): Payer: PPO | Admitting: Oncology

## 2020-07-22 ENCOUNTER — Telehealth: Payer: Self-pay | Admitting: *Deleted

## 2020-07-22 ENCOUNTER — Other Ambulatory Visit: Payer: Self-pay

## 2020-07-22 VITALS — BP 114/58 | HR 66 | Temp 98.1°F | Resp 18 | Wt 165.1 lb

## 2020-07-22 DIAGNOSIS — C7931 Secondary malignant neoplasm of brain: Secondary | ICD-10-CM

## 2020-07-22 DIAGNOSIS — F05 Delirium due to known physiological condition: Secondary | ICD-10-CM | POA: Diagnosis not present

## 2020-07-22 DIAGNOSIS — C3412 Malignant neoplasm of upper lobe, left bronchus or lung: Secondary | ICD-10-CM

## 2020-07-22 DIAGNOSIS — C3490 Malignant neoplasm of unspecified part of unspecified bronchus or lung: Secondary | ICD-10-CM

## 2020-07-22 DIAGNOSIS — E860 Dehydration: Secondary | ICD-10-CM

## 2020-07-22 LAB — CBC WITH DIFFERENTIAL/PLATELET
Abs Immature Granulocytes: 0.01 10*3/uL (ref 0.00–0.07)
Basophils Absolute: 0 10*3/uL (ref 0.0–0.1)
Basophils Relative: 1 %
Eosinophils Absolute: 0.2 10*3/uL (ref 0.0–0.5)
Eosinophils Relative: 4 %
HCT: 38.2 % (ref 36.0–46.0)
Hemoglobin: 12 g/dL (ref 12.0–15.0)
Immature Granulocytes: 0 %
Lymphocytes Relative: 23 %
Lymphs Abs: 1.1 10*3/uL (ref 0.7–4.0)
MCH: 25.6 pg — ABNORMAL LOW (ref 26.0–34.0)
MCHC: 31.4 g/dL (ref 30.0–36.0)
MCV: 81.6 fL (ref 80.0–100.0)
Monocytes Absolute: 0.3 10*3/uL (ref 0.1–1.0)
Monocytes Relative: 7 %
Neutro Abs: 3.2 10*3/uL (ref 1.7–7.7)
Neutrophils Relative %: 65 %
Platelets: 217 10*3/uL (ref 150–400)
RBC: 4.68 MIL/uL (ref 3.87–5.11)
RDW: 14.8 % (ref 11.5–15.5)
WBC: 4.9 10*3/uL (ref 4.0–10.5)
nRBC: 0 % (ref 0.0–0.2)

## 2020-07-22 LAB — COMPREHENSIVE METABOLIC PANEL
ALT: 11 U/L (ref 0–44)
AST: 14 U/L — ABNORMAL LOW (ref 15–41)
Albumin: 3.7 g/dL (ref 3.5–5.0)
Alkaline Phosphatase: 75 U/L (ref 38–126)
Anion gap: 10 (ref 5–15)
BUN: 12 mg/dL (ref 8–23)
CO2: 21 mmol/L — ABNORMAL LOW (ref 22–32)
Calcium: 8.6 mg/dL — ABNORMAL LOW (ref 8.9–10.3)
Chloride: 103 mmol/L (ref 98–111)
Creatinine, Ser: 0.78 mg/dL (ref 0.44–1.00)
GFR, Estimated: >60 mL/min (ref >60)
Glucose, Bld: 137 mg/dL — ABNORMAL HIGH (ref 70–99)
Potassium: 4.4 mmol/L (ref 3.5–5.1)
Sodium: 134 mmol/L — ABNORMAL LOW (ref 135–145)
Total Bilirubin: 0.9 mg/dL (ref 0.3–1.2)
Total Protein: 6.9 g/dL (ref 6.5–8.1)

## 2020-07-22 LAB — URINALYSIS, COMPLETE (UACMP) WITH MICROSCOPIC
Bilirubin Urine: NEGATIVE
Glucose, UA: NEGATIVE mg/dL
Hgb urine dipstick: NEGATIVE
Ketones, ur: NEGATIVE mg/dL
Nitrite: NEGATIVE
Protein, ur: NEGATIVE mg/dL
Specific Gravity, Urine: 1.015 (ref 1.005–1.030)
pH: 5 (ref 5.0–8.0)

## 2020-07-22 MED ORDER — HEPARIN SOD (PORK) LOCK FLUSH 100 UNIT/ML IV SOLN
500.0000 [IU] | Freq: Once | INTRAVENOUS | Status: DC
  Filled 2020-07-22: qty 5

## 2020-07-22 MED ORDER — SODIUM CHLORIDE 0.9% FLUSH
10.0000 mL | Freq: Once | INTRAVENOUS | Status: DC
  Filled 2020-07-22: qty 10

## 2020-07-22 MED ORDER — SODIUM CHLORIDE 0.9% FLUSH
10.0000 mL | Freq: Once | INTRAVENOUS | Status: AC
  Administered 2020-07-22: 10 mL via INTRAVENOUS
  Filled 2020-07-22: qty 10

## 2020-07-22 MED ORDER — SODIUM CHLORIDE 0.9 % IV SOLN
Freq: Once | INTRAVENOUS | Status: AC
  Filled 2020-07-22: qty 250

## 2020-07-22 MED ORDER — HEPARIN SOD (PORK) LOCK FLUSH 100 UNIT/ML IV SOLN
500.0000 [IU] | Freq: Once | INTRAVENOUS | Status: AC
  Administered 2020-07-22: 500 [IU] via INTRAVENOUS
  Filled 2020-07-22: qty 5

## 2020-07-22 NOTE — Patient Instructions (Signed)
Lab Results  Component Value Date   WBC 4.9 07/22/2020   HGB 12.0 07/22/2020   HCT 38.2 07/22/2020   MCV 81.6 07/22/2020   PLT 217 07/22/2020     Chemistry      Component Value Date/Time   NA 134 (L) 07/22/2020 1320   NA 140 12/15/2012 0000   K 4.4 07/22/2020 1320   CL 103 07/22/2020 1320   CO2 21 (L) 07/22/2020 1320   BUN 12 07/22/2020 1320   BUN 22 (A) 12/15/2012 0000   CREATININE 0.78 07/22/2020 1320   CREATININE 0.85 12/23/2018 1428   GLU 105 12/15/2012 0000      Component Value Date/Time   CALCIUM 8.6 (L) 07/22/2020 1320   ALKPHOS 75 07/22/2020 1320   AST 14 (L) 07/22/2020 1320   ALT 11 07/22/2020 1320   BILITOT 0.9 07/22/2020 1320     Urinalysis    Component Value Date/Time   COLORURINE YELLOW (A) 07/22/2020 1307   APPEARANCEUR HAZY (A) 07/22/2020 1307   APPEARANCEUR Hazy (A) 07/01/2020 1002   LABSPEC 1.015 07/22/2020 1307   PHURINE 5.0 07/22/2020 1307   GLUCOSEU NEGATIVE 07/22/2020 1307   GLUCOSEU NEGATIVE 01/18/2020 1042   HGBUR NEGATIVE 07/22/2020 1307   BILIRUBINUR NEGATIVE 07/22/2020 1307   BILIRUBINUR Negative 07/01/2020 [place] 07/22/2020 1307   PROTEINUR NEGATIVE 07/22/2020 1307   UROBILINOGEN 0.2 01/18/2020 1042   NITRITE NEGATIVE 07/22/2020 1307   LEUKOCYTESUR LARGE (A) 07/22/2020 1307

## 2020-07-22 NOTE — Telephone Encounter (Signed)
RN Spoke with Kimberly Chang, NP. Patient reports increase in the severity of headaches, confusion, irritability with family on Saturday. MsContin was recently increased; but due to the level of confusion - daughter held MSContin per [place]. Daughter had noted an episode of pt's confusion last week, when patient went to the [employer]. She is also an avid card player; she normally plays frequently with her friends. She forgot how to play her card game.  Per [name], v/o to order MRI brain, cbc, metc, u/a culture. See Sonia Baller this afternoon in [facility].

## 2020-07-22 NOTE — Telephone Encounter (Signed)
Patient's daughter has also held the patient's insulin due hypoglycemia. Blood glucose levels are running around 70 without her insulin.  I spoke with the daughter. She is currently in [place]. She is driving to [place] now to get the patient. She can have her at the clinic at 1 pm. If MRI can not be done today, daughter states that the patient has an DG esophagus in the am. We could coordinate the MRI with these apts.

## 2020-07-22 NOTE — Telephone Encounter (Signed)
I received a call from patient's daughter.  She increased the dose of MS Contin as we discussed on Saturday.  However, Sunday patient had an episode of confusion on the drive home.  Daughter subsequently held [place].  However, daughter reports that patient has had several instances of confusion that predated change in frequency of MS Contin.  She became confused last week in the grocery store.  She also forgot how to play poker with her friends, which she has been doing weekly for years.  Daughter also reports increased frequency and severity of headaches.  Discussed with Dr. Rogue Bussing.  We will bring patient in to [facility] for visit.  Plan for repeat MRI of the brain, CBC, CMP, UA and culture.

## 2020-07-22 NOTE — Progress Notes (Signed)
Symptom Management Consult note [facility]  Telephone:([phone] [phone]  Patient Care Team: [name], MD as PCP - General (Internal Medicine) [name], MD (General Surgery) [name], MD (Internal Medicine) Telford Nab, RN as Oncology Nurse Navigator [name], [place] (Pharmacist)   Name of the patient: Kimberly Huff  [id number]  01-Jul-1945   Date of visit: 07/22/2020   Diagnosis-lung cancer   Chief complaint/ Reason for visit-confusion  Heme/Onc history: Mrs. Haran is a 75 year old female with past medical history significant for hypertension, nonalcoholic cirrhosis, type 2 diabetes, dyslipidemia, osteoporosis with recent diagnosis of stage IV lung cancer.  She is followed by Dr. Rogue Bussing and is currently under surveillance.  She is status post SBRT for brain metastasis on 02/14/2020 and XRT to left upper lobe on 05/14/20.  Most recent PET scan from 06/11/2020 showed improvement of left upper lobe lung mass.   Interval history-today, Mrs. Kimberly Huff presents for concerns of worsening headaches, confusion and irritability.  Per daughter symptoms have been present for the past 8 to 10 days.  Notes several different occasions where she has had unusual outbursts and becomes angry for no reason.  Endorses forgetting how to play poker last night which she has played weekly for many years.  Reports a decline in her appetite over the past several days.  Headaches have become fairly chronic and consistent daily and never really relieved with pain medications.  Denies any nausea or vomiting.  Reports thoracic back pain that comes and goes.  Recently increased MS Contin from twice daily to 3 times daily x2 days.  Daughter has since stopped giving her increased dose d/t increased confusion.  She is taking her short acting oxycodone for pain.  Continues to have trouble swallowing but is scheduled for a swallow study  tomorrow.  ECOG FS:2 - Symptomatic, <50% confined to bed  Review of systems- Review of Systems  Constitutional: Negative.  Negative for chills, fever, malaise/fatigue and weight loss.  HENT: Negative for congestion, ear pain and tinnitus.        Swallowing problems   Eyes: Negative.  Negative for blurred vision and double vision.  Respiratory: Negative.  Negative for cough, sputum production and shortness of breath.   Cardiovascular: Negative.  Negative for chest pain, palpitations and leg swelling.  Gastrointestinal: Negative.  Negative for abdominal pain, constipation, diarrhea, nausea and vomiting.  Genitourinary: Negative for dysuria, frequency and urgency.  Musculoskeletal: Negative for back pain and falls.  Skin: Negative.  Negative for rash.  Neurological: Positive for weakness and headaches.  Endo/Heme/Allergies: Negative.  Does not bruise/bleed easily.  Psychiatric/Behavioral: Positive for memory loss. Negative for depression. The patient is not nervous/anxious and does not have insomnia.        Confusion     Current treatment-status post SBRT back in October 2021 and XRT in Jan 2022.  Currently on surveillance.  No Known Allergies   Past Medical History:  Diagnosis Date  . Allergy   . Atrophic kidney    left  . Blood clot in vein 45 yrs ago   inabdoment after surgery  . Blood transfusion without reported diagnosis   . Cataract   . Complication of anesthesia   . Coronary artery disease   . Coronary atherosclerosis   . Diabetes mellitus   . Diverticulosis 2005   Dr Jamal Collin  . Dyspnea    with exertion  . External hemorrhoids without mention of complication 7902  . GERD (  gastroesophageal reflux disease)   . H/O cardiac catheterization 2005  . Heart murmur 2012   found by Dr. Jamal Collin  . Hyperlipidemia   . Hypertension   . Kidney stone   . Lesion of bladder   . Myocardial infarction ([place]) 2000  . Neuromuscular disorder ([place])    "achy muscles"  . Obesity   .  Osteoarthritis   . PONV (postoperative nausea and vomiting)   . Recurrent UTI   . Seizure ([place])   . Spinal stenosis of cervical region   . Steatohepatitis 05/30/10   Brunt Stage 3, non alcoholic cirrhosis of the liver  . Type II or unspecified type diabetes mellitus without mention of complication, uncontrolled    Patient does takes Metformin and recommended to stop for 48 hours.  Marland Kitchen Ulcer   . Unspecified essential hypertension 2014     Past Surgical History:  Procedure Laterality Date  . ABDOMINAL HYSTERECTOMY  1982   complete  . BREAST CYST ASPIRATION Right 1990's   benign  . BREAST MASS EXCISION     benign  . CARDIAC CATHETERIZATION  2000   cardiac stent  . cardiac stents  2000  . CATARACT EXTRACTION     both eyes  . COLONOSCOPY  2004, 2015   Dr. Jamal Collin, Dr. Olevia Perches  . CYSTOSCOPY W/ RETROGRADES Left 11/12/2015   Procedure: CYSTOSCOPY WITH RETROGRADE PYELOGRAM;  Surgeon: Cleon Gustin, MD;  Location: [facility];  Service: Urology;  Laterality: Left;  . CYSTOSCOPY W/ URETERAL STENT PLACEMENT Left 11/12/2015   Procedure: CYSTOSCOPY WITH STENT REPLACEMENT;  Surgeon: Cleon Gustin, MD;  Location: [facility];  Service: Urology;  Laterality: Left;  . CYSTOSCOPY WITH BIOPSY N/A 09/26/2019   Procedure: CYSTOSCOPY WITH BIOPSY AND FULGURATION;  Surgeon: Cleon Gustin, MD;  Location: [facility];  Service: Urology;  Laterality: N/A;  . CYSTOSCOPY WITH URETEROSCOPY AND STENT PLACEMENT Left 10/15/2015   Procedure: CYSTOSCOPY WITH URETEROSCOPY AND STENT PLACEMENT;  Surgeon: Cleon Gustin, MD;  Location: [facility];  Service: Urology;  Laterality: Left;  . EYE SURGERY Bilateral 2012   cataract  . FOOT SURGERY  2008  . IR IMAGING GUIDED PORT INSERTION  05/23/2020  . PTCA  2000   stent x 1  . ROBOTIC ASSITED PARTIAL NEPHRECTOMY Left 06/02/2019   Procedure: XI ROBOTIC ASSITED NEPHRECTOMY;  Surgeon: Cleon Gustin, MD;  Location: [facility];  Service: Urology;   Laterality: Left;  . SALPINGOOPHORECTOMY    . TONSILLECTOMY    . UPPER GI ENDOSCOPY    . URETEROSCOPY WITH HOLMIUM LASER LITHOTRIPSY Left 11/12/2015   Procedure: URETEROSCOPY WITH HOLMIUM LASER LITHOTRIPSY;  Surgeon: Cleon Gustin, MD;  Location: [facility];  Service: Urology;  Laterality: Left;  . URETEROTOMY  1960/1969   x 2 during childhood  . VAGINAL HYSTERECTOMY    . VIDEO BRONCHOSCOPY WITH ENDOBRONCHIAL NAVIGATION N/A 02/21/2020   Procedure: VIDEO BRONCHOSCOPY WITH ENDOBRONCHIAL NAVIGATION;  Surgeon: Ottie Glazier, MD;  Location: [facility];  Service: Thoracic;  Laterality: N/A;  . VIDEO BRONCHOSCOPY WITH ENDOBRONCHIAL ULTRASOUND N/A 02/21/2020   Procedure: VIDEO BRONCHOSCOPY WITH ENDOBRONCHIAL ULTRASOUND;  Surgeon: Ottie Glazier, MD;  Location: [facility];  Service: Thoracic;  Laterality: N/A;    Social History   Socioeconomic History  . Marital status: Single    Spouse name: Not on file  . Number of children: 2  . Years of education: Not on file  . Highest education level: Not on file  Occupational History  . Occupation: Account  executive    Employer: [employer] IN [place]  Tobacco Use  . Smoking status: Former Smoker    Packs/day: 0.50    Years: 38.00    Pack years: 19.00    Types: Cigarettes    Quit date: 09/15/1998    Years since quitting: 21.8  . Smokeless tobacco: Never Used  Vaping Use  . Vaping Use: Never used  Substance and Sexual Activity  . Alcohol use: Not Currently  . Drug use: No  . Sexual activity: Not Currently  Other Topics Concern  . Not on file  Social History Narrative   Lives in [place]; daughter lives in [place]. Quit smoking may 14th 2000. No alcohol. Retired  From [employer] job.    Social Determinants of Health   Financial Resource Strain: Low Risk   . Difficulty of Paying Living Expenses: Not hard at all  Food Insecurity: No Food Insecurity  . Worried About Charity fundraiser in the Last Year: Never true  . Ran Out of  Food in the Last Year: Never true  Transportation Needs: No Transportation Needs  . Lack of Transportation (Medical): No  . Lack of Transportation (Non-Medical): No  Physical Activity: Not on file  Stress: No Stress Concern Present  . Feeling of Stress : Only a little  Social Connections: Unknown  . Frequency of Communication with Friends and Family: More than three times a week  . Frequency of Social Gatherings with Friends and Family: More than three times a week  . Attends Religious Services: More than 4 times per year  . Active Member of Clubs or Organizations: Yes  . Attends Archivist Meetings: More than 4 times per year  . Marital Status: Not on file  Intimate Partner Violence: Not At Risk  . Fear of Current or Ex-Partner: No  . Emotionally Abused: No  . Physically Abused: No  . Sexually Abused: No    Family History  Problem Relation Age of Onset  . Lung cancer Father   . Heart disease Father   . Diverticulosis Mother   . Diabetes Mother   . Stroke Mother 75  . Autoimmune disease Daughter   . Thyroid disease Daughter   . Thyroid disease Sister   . Stomach cancer Maternal Aunt   . Breast cancer Maternal Aunt   . Colon cancer Neg Hx   . Esophageal cancer Neg Hx   . Rectal cancer Neg Hx      Current Outpatient Medications:  .  acetaminophen (TYLENOL) 500 MG tablet, Take 500 mg by mouth every 6 (six) hours as needed., Disp: , Rfl:  .  colchicine 0.6 MG tablet, Take 1 tablet (0.6 mg total) by mouth 2 (two) times daily. (Patient not taking: Reported on 07/15/2020), Disp: 20 tablet, Rfl: 0 .  Continuous Blood Gluc Receiver (FREESTYLE LIBRE 14 DAY READER) DEVI, Use to check BS 4 times daily, Disp: 2 each, Rfl: 2 .  Continuous Blood Gluc Sensor (FREESTYLE LIBRE 14 DAY SENSOR) MISC, Use to check BS 4 times daily, Disp: 6 each, Rfl: 3 .  denosumab (PROLIA) 60 MG/ML SOSY injection, Inject into the skin., Disp: , Rfl:  .  furosemide (LASIX) 40 MG tablet, TAKE ONE  TABLET EVERY DAY (Patient not taking: Reported on 07/15/2020), Disp: 90 tablet, Rfl: 1 .  Insulin Pen Needle 32G X 6 MM MISC, 1 Stick by Does not apply route daily., Disp: 100 each, Rfl: 1 .  LANTUS SOLOSTAR 100 UNIT/ML Solostar Pen, INJECT 20 UNITS  INTO THE SKIN DAILY AS DIRECTED (Patient taking differently: Inject 15 Units into the skin daily.), Disp: 15 mL, Rfl: PRN .  levETIRAcetam (KEPPRA) 500 MG tablet, Take 1 tablet (500 mg total) by mouth 2 (two) times daily., Disp: 60 tablet, Rfl: 2 .  lidocaine-prilocaine (EMLA) cream, Apply 1 application topically as needed. 30-45 mins prior to port access., Disp: 30 g, Rfl: 0 .  losartan (COZAAR) 50 MG tablet, Take 1 tablet (50 mg total) by mouth daily., Disp: 90 tablet, Rfl: 3 .  morphine (MS CONTIN) 15 MG 12 hr tablet, Take 1 tablet (15 mg total) by mouth every 8 (eight) hours., Disp: 90 tablet, Rfl: 0 .  naloxone (NARCAN) nasal spray 4 mg/0.1 mL, 1 spray into nostril upon signs of opioid overdose. Call 911. May repeat once if no response within 2-3 minutes. (Patient not taking: No sig reported), Disp: 1 each, Rfl: 0 .  omeprazole (PRILOSEC) 40 MG capsule, TAKE 1 CAPSULE BY MOUTH ONCE DAILY, Disp: 90 capsule, Rfl: 1 .  ondansetron (ZOFRAN) 8 MG tablet, One pill every 8 hours as needed for nausea/vomitting., Disp: 90 tablet, Rfl: 1 .  ONETOUCH ULTRA test strip, USE AS INSTRUCTED, Disp: 100 each, Rfl: 12 .  oxyCODONE (ROXICODONE) 15 MG immediate release tablet, Take 1-2 tablets (15-30 mg total) by mouth every 4 (four) hours as needed for pain., Disp: 90 tablet, Rfl: 0 .  prochlorperazine (COMPAZINE) 10 MG tablet, Take 1 tablet (10 mg total) by mouth every 6 (six) hours as needed for nausea or vomiting. (Patient not taking: Reported on 07/15/2020), Disp: 40 tablet, Rfl: 1 .  rosuvastatin (CRESTOR) 10 MG tablet, TAKE ONE TABLET EVERY DAY, Disp: 90 tablet, Rfl: 1 .  sucralfate (CARAFATE) 1 g tablet, Take 0.5 tablets (0.5 g total) by mouth 3 (three) times daily.  Dissolve in 3-4 tbsp warm water, swallow. (Patient not taking: No sig reported), Disp: 60 tablet, Rfl: 1 .  triamcinolone ointment (KENALOG) 0.5 %, Apply 1 application topically 2 (two) times daily. (Patient not taking: No sig reported), Disp: 30 g, Rfl: 0 .  trimethoprim (TRIMPEX) 100 MG tablet, Take 1 tablet (100 mg total) by mouth at bedtime., Disp: 30 tablet, Rfl: 11 No current facility-administered medications for this visit.  Facility-Administered Medications Ordered in Other Visits:  .  heparin lock flush 100 unit/mL, 500 Units, Intravenous, Once, Brahmanday, Govinda R, MD .  sodium chloride flush (NS) 0.9 % injection 10 mL, 10 mL, Intravenous, Once, [name], MD  Physical exam:  Vitals:   07/22/20 1322  BP: (!) 114/58  Pulse: 66  Resp: 18  Temp: 98.1 F (36.7 C)  TempSrc: Tympanic  SpO2: 95%  Weight: 165 lb 2 oz (74.9 kg)   Physical Exam Constitutional:      Appearance: Normal appearance.  HENT:     Head: Normocephalic and atraumatic.  Eyes:     Pupils: Pupils are equal, round, and reactive to light.  Cardiovascular:     Rate and Rhythm: Normal rate and regular rhythm.     Heart sounds: Normal heart sounds. No murmur heard.   Pulmonary:     Effort: Pulmonary effort is normal.     Breath sounds: Normal breath sounds. No wheezing.  Abdominal:     General: Bowel sounds are normal. There is no distension.     Palpations: Abdomen is soft.     Tenderness: There is no abdominal tenderness.  Musculoskeletal:        General: Normal range of motion.  Cervical back: Normal range of motion.  Skin:    General: Skin is warm and dry.     Findings: No rash.  Neurological:     Mental Status: She is alert and oriented to person, place, and time.  Psychiatric:        Judgment: Judgment normal.      CMP Latest Ref Rng & Units 07/22/2020  Glucose 70 - 99 mg/dL 137(H)  BUN 8 - 23 mg/dL 12  Creatinine 0.44 - 1.00 mg/dL 0.78  Sodium 135 - 145 mmol/L 134(L)   Potassium 3.5 - 5.1 mmol/L 4.4  Chloride 98 - 111 mmol/L 103  CO2 22 - 32 mmol/L 21(L)  Calcium 8.9 - 10.3 mg/dL 8.6(L)  Total Protein 6.5 - 8.1 g/dL 6.9  Total Bilirubin 0.3 - 1.2 mg/dL 0.9  Alkaline Phos 38 - 126 U/L 75  AST 15 - 41 U/L 14(L)  ALT 0 - 44 U/L 11   CBC Latest Ref Rng & Units 07/22/2020  WBC 4.0 - 10.5 K/uL 4.9  Hemoglobin 12.0 - 15.0 g/dL 12.0  Hematocrit 36.0 - 46.0 % 38.2  Platelets 150 - 400 K/uL 217    No images are attached to the encounter.  No results found.   Assessment and plan- Patient is a 75 y.o. female who presents to symptom management for concerns of worsening confusion, headaches and irritability.  Lung cancer with brain metastasis: -She is status post SBRT with Dr. Donella Stade in October 2021. -Completed XRT to LUL on 05/14/2020. -PET scan from 06/11/2020 showed great response to treatment.  -She is on Keppra for seizure prophylaxis.  -She is currently on surveillance and has follow-up with Dr. Rogue Bussing on 08/12/2020.   Confusion/irritability/headaches: -Suspicious for recurrence worsening metastatic disease in the brain. -We will collect labs today to rule out electrolyte abnormalities, dehydration or urinary tract infection.  -Labs from 07/22/2020 are fairly unremarkable.   -Given decrease in oral intake, will proceed with 1 L NaCl while in clinic.   -Urinalysis shows large amount of leukocytes.  She was recently treated for an E. coli UTI by Dr. Alyson Ingles.  -She is currently on Bactrim 100 mg daily for prophylaxis d/t chronic UTI. -Given chronic UTIs and colonization with no true oral antibiotic option for E. coli UTI, she likely would require IV antibiotics should treatment be needed. -Schedule MRI of the brain ASAP.  Swallowing concerns: -She is scheduled for a swallow study tomorrow.  Disposition: -If MRI of brain is negative and patient having persistent symptoms, could consider IV antibiotics for UTI.  -We will call patient with  results -Keep follow-up as scheduled.  Visit Diagnosis 1. Brain metastasis ([place])   2. Acute confusional state   3. Primary cancer of left upper lobe of lung [facility])     Patient expressed understanding and was in agreement with this plan. She also understands that She can call clinic at any time with any questions, concerns, or complaints.   Greater than 50% was spent in counseling and coordination of care with this patient including but not limited to discussion of the relevant topics above (See A&P) including, but not limited to diagnosis and management of acute and chronic medical conditions.   Thank you for allowing me to participate in the care of this very pleasant patient.    Jacquelin Hawking, NP [place] at [facility] Cell - [phone] Pager- [phone] 07/23/2020 9:55 AM

## 2020-07-23 ENCOUNTER — Encounter: Payer: Self-pay | Admitting: Internal Medicine

## 2020-07-23 ENCOUNTER — Ambulatory Visit
Admission: RE | Admit: 2020-07-23 | Discharge: 2020-07-23 | Disposition: A | Discharge status: Home / Self Care | Payer: PPO | Source: Ambulatory Visit | Attending: Hospice and Palliative Medicine | Admitting: Hospice and Palliative Medicine

## 2020-07-23 ENCOUNTER — Inpatient Hospital Stay (HOSPITAL_BASED_OUTPATIENT_CLINIC_OR_DEPARTMENT_OTHER): Payer: PPO | Admitting: Internal Medicine

## 2020-07-23 ENCOUNTER — Ambulatory Visit
Admission: RE | Admit: 2020-07-23 | Discharge: 2020-07-23 | Disposition: A | Discharge status: Home / Self Care | Payer: PPO | Source: Ambulatory Visit | Attending: Internal Medicine | Admitting: Internal Medicine

## 2020-07-23 ENCOUNTER — Inpatient Hospital Stay: Payer: PPO

## 2020-07-23 ENCOUNTER — Telehealth: Payer: Self-pay | Admitting: Hospice and Palliative Medicine

## 2020-07-23 ENCOUNTER — Ambulatory Visit
Admission: RE | Admit: 2020-07-23 | Discharge: 2020-07-23 | Disposition: A | Discharge status: Home / Self Care | Payer: PPO | Source: Ambulatory Visit | Attending: Radiation Oncology | Admitting: Radiation Oncology

## 2020-07-23 ENCOUNTER — Other Ambulatory Visit: Payer: Self-pay

## 2020-07-23 DIAGNOSIS — F05 Delirium due to known physiological condition: Secondary | ICD-10-CM | POA: Diagnosis not present

## 2020-07-23 DIAGNOSIS — K219 Gastro-esophageal reflux disease without esophagitis: Secondary | ICD-10-CM | POA: Diagnosis not present

## 2020-07-23 DIAGNOSIS — C3490 Malignant neoplasm of unspecified part of unspecified bronchus or lung: Secondary | ICD-10-CM

## 2020-07-23 DIAGNOSIS — C7931 Secondary malignant neoplasm of brain: Secondary | ICD-10-CM | POA: Insufficient documentation

## 2020-07-23 DIAGNOSIS — C3412 Malignant neoplasm of upper lobe, left bronchus or lung: Secondary | ICD-10-CM

## 2020-07-23 DIAGNOSIS — R1319 Other dysphagia: Secondary | ICD-10-CM | POA: Insufficient documentation

## 2020-07-23 DIAGNOSIS — C3492 Malignant neoplasm of unspecified part of left bronchus or lung: Secondary | ICD-10-CM | POA: Diagnosis not present

## 2020-07-23 DIAGNOSIS — Z95828 Presence of other vascular implants and grafts: Secondary | ICD-10-CM

## 2020-07-23 DIAGNOSIS — G936 Cerebral edema: Secondary | ICD-10-CM | POA: Diagnosis not present

## 2020-07-23 DIAGNOSIS — C801 Malignant (primary) neoplasm, unspecified: Secondary | ICD-10-CM | POA: Diagnosis not present

## 2020-07-23 DIAGNOSIS — K224 Dyskinesia of esophagus: Secondary | ICD-10-CM | POA: Diagnosis not present

## 2020-07-23 MED ORDER — GADOBUTROL 1 MMOL/ML IV SOLN
7.5000 mL | Freq: Once | INTRAVENOUS | Status: AC | PRN
  Administered 2020-07-23: 7.5 mL via INTRAVENOUS

## 2020-07-23 MED ORDER — HEPARIN SOD (PORK) LOCK FLUSH 100 UNIT/ML IV SOLN
500.0000 [IU] | Freq: Once | INTRAVENOUS | Status: AC
  Administered 2020-07-23: 500 [IU]
  Filled 2020-07-23: qty 5

## 2020-07-23 MED ORDER — DEXAMETHASONE 2 MG PO TABS: 2.0000 mg | ORAL_TABLET | Freq: Three times a day (TID) | ORAL | 0 refills | Status: DC

## 2020-07-23 NOTE — Progress Notes (Signed)
Radiation Oncology Follow up Note  Name: Kimberly Huff   Date:   07/23/2020 MRN:  [id number] DOB: [birth year]    This 75 y.o. female presents to the clinic today for multiple brain metastasis in patient with known stage IV non-small cell lung cancer previously treated with SRS for single brain metastasis as well as palliative radiation therapy to her chest.  REFERRING PROVIDER: [name], MD  HPI: Patient is a 75 year old female well-known to our point department.  She has received SRS for single brain metastasis from stage IV non-small cell lung cancer.  She also received palliative radiation therapy to her chest..  She has known non-small cell lung cancer adenocarcinoma.  She has been under surveillance although recently presented with increasing confusion headaches and MRI scan of her brain shows approximately 7 new metastatic lesions associated with edema with no significant mass-effect.  There is also adjacent frontal leptomeningeal enhancement.  PET CT scan from last month showed substantial reduced activity in the chest.  She is seen today for radiation oncology opinion  COMPLICATIONS OF TREATMENT: none  FOLLOW UP COMPLIANCE: keeps appointments   PHYSICAL EXAM:  There were no vitals taken for this visit.  Patient denies any weight loss, fatigue, weakness, fever, chills or night sweats. Patient denies any loss of vision, blurred vision. Patient denies any ringing  of the ears or hearing loss. No irregular heartbeat. Patient denies heart murmur or history of fainting. Patient denies any chest pain or pain radiating to her upper extremities. Patient denies any shortness of breath, difficulty breathing at night, cough or hemoptysis. Patient denies any swelling in the lower legs. Patient denies any nausea vomiting, vomiting of blood, or coffee ground material in the vomitus. Patient denies any stomach pain. Patient states has had normal bowel movements no significant constipation or  diarrhea. Patient denies any dysuria, hematuria or significant nocturia. Patient denies any problems walking, swelling in the joints or loss of balance. Patient denies any skin changes, loss of hair or loss of weight. Patient denies any excessive worrying or anxiety or significant depression. Patient denies any problems with insomnia. Patient denies excessive thirst, polyuria, polydipsia. Patient denies any swollen glands, patient denies easy bruising or easy bleeding. Patient denies any recent infections, allergies or URI. Patient "s visual fields have not changed significantly in recent time.  RADIOLOGY RESULTS: MRI scan and PET scan reviewed compatible with above-stated findings  PLAN: At this time she will be started on Decadron by medical oncology.  I will plan on delivering 30 Gray in 12 fractions to her whole brain.  Do not see a reason to biopsy possible leptomeningeal spread.  Do not see how that would change our clinical course.  Risks and benefits of whole brain radiation including hair loss fatigue alteration of blood counts skin reaction all were discussed in detail with the patient.  She and her daughter both comprehend my treatment plan well.  Simulation was planned for tomorrow.  I would like to take this opportunity to thank you for allowing me to participate in the care of your patient.[name], MD

## 2020-07-23 NOTE — Telephone Encounter (Signed)
I called and spoke with patient's daughter following their visit today with Drs. Brahmanday and Chrystal.  Patient is pending evaluation for whole brain radiation given MRI findings.  She is also been restarted on steroids.  Daughter had asked about starting patient on something for anxiety.  However, in discussion with this today on the phone, daughter said that she would like to hold off on starting any new medications.  I think that that is reasonable and we can reconsider in the future if needed.  Daughter asked me about hospice and we discussed that as a future option should patient forego active treatment.  I did offer community-based palliative care referral but daughter wanted to think about it.

## 2020-07-23 NOTE — Assessment & Plan Note (Addendum)
#  Non-small cell lung cancer/adenocarcinoma- s/p palliative radiation left upper lobe  [jan 18th, 2022]; also-SRS for brain metastases [finished October 2021] FEB 9th, 2022- PET scan- NO DISTANT METASTATIC DISEASE; improved left upper lobe mass SUV.  #However unfortunately-MRI brain 3/22-ordered for mental status changes/emotional outbursts-shows progressive disease likely 7 lesions in the brain with edema.  Recommend starting-dexamethasone 2 mg 3 times daily [history of poorly controlled blood sugars on steroids/neck swelling].  Discussed with Dr. Sonny Masters whole brain radiation.  # With regards to systemic therapy-options include chemotherapy-immunotherapy; single agent Keytruda [PD-L1 95%]. + However daughter patient extremely concerned about declining quality of life-while on therapies.   # ? Tumor fevers/sweating- every day [99 or 100.1]- improves with Tylenol prn.  # ? Difficulty in swallowing solids/liquids-question radiation-induced-awaiting esophagogram.  # Back pain likely degenerative-worse; MS Contin 15 mg 3 times daily; oxycodone 15 mg for breakthrough pain medication.  Discussed with [name].  # Solitary kidney-normal renal function-  UA-clinically not infection/UTI especially given history of MDR colonization.STABLE.  # IV access- s/p port placement  #Prognosis: Patient is at high risk of complications from systemic therapy-given her multiple medical problems/declining performance status/risk of falls.  Discussed with the patient and daughter that unfortunately prognosis is poor especially given the rapid recurrence of brain metastases status post SRS.  Life expectancy is in the order of few months.  Discussed that hospice would be recommended if patient performed self declines/worsens.  Discussed with [employer].   # 40 minutes face-to-face with the patient discussing the above plan of care; more than 50% of time spent on prognosis/ natural history; counseling and  coordination.  # DISPOSITION: # follow up TBD-Dr.B

## 2020-07-23 NOTE — Progress Notes (Signed)
[facility] CONSULT NOTE  Patient Care Team: [name], MD as PCP - General (Internal Medicine) [name], MD (General Surgery) [name], MD (Internal Medicine) Telford Nab, RN as Oncology Nurse Navigator [name], RPH-CPP (Pharmacist)  CHIEF COMPLAINTS/PURPOSE OF CONSULTATION:  Lung cancer/Brain metastases  #  Oncology History Overview Note  # OCT 2021-left upper lobe lung mass invading mediastinum; bronc/Bx-  Oct 20th [Dr.Aleskerov]; non-small cell carcinoma/adenocarcinoma.  Stage IV [brain metastases]; NGS-QNS; PDL-1- 95%.  #2021-PET scan left upper lobe mass; presacral question benign mass; no distant site disease  #Right frontal/temporal brain met x2 [1-1.68m]-seizure- on dex; Keppra; SBRT s/p Oct 13th, 2021  # 11/18-RT to LUL ca [? hemoptysis] s/p RT [until jan 11th, 2022]  # 2021- incidental-presacral mass-without significant SUV uptake suspect-benign etiology monitor for now.  # DM on OHA; HTN; SOLITARY KIDNEY [frequent infections/ atrophic- s/p nephrectomy 2020]; colonization-multiple drug resistance [s/p ID- Dr.Ravishankar- DEC 2021]  # NGS/MOLECULAR TESTS:NGS-QNS; guardant testing-inconclusive; PDL-1- 95%.     # PALLIATIVE CARE EVALUATION:03/18/2020  # PAIN MANAGEMENT:   DIAGNOSIS: Lung cancer  STAGE:   IV      ;  GOALS: Palliative  CURRENT/MOST RECENT THERAPY : RT    Brain metastasis ([facility]  02/02/2020 Initial Diagnosis   Brain metastasis (HCC)   Primary cancer of [place] lobe of lung ([facility]  02/26/2020 Initial Diagnosis   Primary cancer of left upper lobe of lung ([facility]   05/14/2020 Cancer Staging   Staging form: Lung, AJCC 8th Edition - Clinical: Stage IVB (cT3, pM1c) - Signed by BCammie Sickle MD on 05/14/2020    HISTORY OF PRESENTING ILLNESS:  [name].[id number].  female  patient with a history of solitary kidney; diabetes; left lung cancer; brain metastasis status post SRS; S/p RT to LUL  cancer is here for follow-up reviewed the results of the brain MRI.  Patient was recently seen in symptom management clinic-for mental status changes/emotional outburst.  Patient patient had MRI this morning-MRI showed concerning findings of progressive disease in the brain.  Patient continues to have low-grade fevers of 99-100; improved with Tylenol.    Review of Systems  Constitutional: Positive for fever and malaise/fatigue. Negative for chills, diaphoresis and weight loss.  HENT: Negative for nosebleeds and sore throat.   Eyes: Negative for double vision.  Respiratory: Negative for cough, sputum production and wheezing.   Cardiovascular: Negative for chest pain, palpitations and orthopnea.  Gastrointestinal: Negative for abdominal pain, blood in stool, constipation, diarrhea, heartburn, melena, nausea and vomiting.  Genitourinary: Negative for dysuria, frequency and urgency.  Musculoskeletal: Positive for back pain and joint pain.  Skin: Negative.  Negative for itching and rash.  Neurological: Negative for dizziness, tingling, focal weakness, weakness and headaches.  Endo/Heme/Allergies: Does not bruise/bleed easily.  Psychiatric/Behavioral: Positive for memory loss. Negative for depression. The patient is nervous/anxious and has insomnia.      MEDICAL HISTORY:  Past Medical History:  Diagnosis Date  . Allergy   . Atrophic kidney    left  . Blood clot in vein 45 yrs ago   inabdoment after surgery  . Blood transfusion without reported diagnosis   . Cataract   . Complication of anesthesia   . Coronary artery disease   . Coronary atherosclerosis   . Diabetes mellitus   . Diverticulosis 2005   Dr SJamal Collin . Dyspnea    with exertion  . External hemorrhoids without mention of complication 20240 . GERD (  gastroesophageal reflux disease)   . H/O cardiac catheterization 2005  . Heart murmur 2012   found by Dr. Jamal Collin  . Hyperlipidemia   . Hypertension   . Kidney stone   .  Lesion of bladder   . Myocardial infarction ([place]) 2000  . Neuromuscular disorder ([place])    "achy muscles"  . Obesity   . Osteoarthritis   . PONV (postoperative nausea and vomiting)   . Recurrent UTI   . Seizure ([place])   . Spinal stenosis of cervical region   . Steatohepatitis 05/30/10   Brunt Stage 3, non alcoholic cirrhosis of the liver  . Type II or unspecified type diabetes mellitus without mention of complication, uncontrolled    Patient does takes Metformin and recommended to stop for 48 hours.  Marland Kitchen Ulcer   . Unspecified essential hypertension 2014    SURGICAL HISTORY: Past Surgical History:  Procedure Laterality Date  . ABDOMINAL HYSTERECTOMY  1982   complete  . BREAST CYST ASPIRATION Right 1990's   benign  . BREAST MASS EXCISION     benign  . CARDIAC CATHETERIZATION  2000   cardiac stent  . cardiac stents  2000  . CATARACT EXTRACTION     both eyes  . COLONOSCOPY  2004, 2015   Dr. Jamal Collin, Dr. Olevia Perches  . CYSTOSCOPY W/ RETROGRADES Left 11/12/2015   Procedure: CYSTOSCOPY WITH RETROGRADE PYELOGRAM;  Surgeon: Cleon Gustin, MD;  Location: [facility];  Service: Urology;  Laterality: Left;  . CYSTOSCOPY W/ URETERAL STENT PLACEMENT Left 11/12/2015   Procedure: CYSTOSCOPY WITH STENT REPLACEMENT;  Surgeon: Cleon Gustin, MD;  Location: [facility];  Service: Urology;  Laterality: Left;  . CYSTOSCOPY WITH BIOPSY N/A 09/26/2019   Procedure: CYSTOSCOPY WITH BIOPSY AND FULGURATION;  Surgeon: Cleon Gustin, MD;  Location: [facility];  Service: Urology;  Laterality: N/A;  . CYSTOSCOPY WITH URETEROSCOPY AND STENT PLACEMENT Left 10/15/2015   Procedure: CYSTOSCOPY WITH URETEROSCOPY AND STENT PLACEMENT;  Surgeon: Cleon Gustin, MD;  Location: [facility];  Service: Urology;  Laterality: Left;  . EYE SURGERY Bilateral 2012   cataract  . FOOT SURGERY  2008  . IR IMAGING GUIDED PORT INSERTION  05/23/2020  . PTCA  2000   stent x 1  . ROBOTIC ASSITED PARTIAL NEPHRECTOMY  Left 06/02/2019   Procedure: XI ROBOTIC ASSITED NEPHRECTOMY;  Surgeon: Cleon Gustin, MD;  Location: [facility];  Service: Urology;  Laterality: Left;  . SALPINGOOPHORECTOMY    . TONSILLECTOMY    . UPPER GI ENDOSCOPY    . URETEROSCOPY WITH HOLMIUM LASER LITHOTRIPSY Left 11/12/2015   Procedure: URETEROSCOPY WITH HOLMIUM LASER LITHOTRIPSY;  Surgeon: Cleon Gustin, MD;  Location: [facility];  Service: Urology;  Laterality: Left;  . URETEROTOMY  1960/1969   x 2 during childhood  . VAGINAL HYSTERECTOMY    . VIDEO BRONCHOSCOPY WITH ENDOBRONCHIAL NAVIGATION N/A 02/21/2020   Procedure: VIDEO BRONCHOSCOPY WITH ENDOBRONCHIAL NAVIGATION;  Surgeon: Ottie Glazier, MD;  Location: [facility];  Service: Thoracic;  Laterality: N/A;  . VIDEO BRONCHOSCOPY WITH ENDOBRONCHIAL ULTRASOUND N/A 02/21/2020   Procedure: VIDEO BRONCHOSCOPY WITH ENDOBRONCHIAL ULTRASOUND;  Surgeon: Ottie Glazier, MD;  Location: [facility];  Service: Thoracic;  Laterality: N/A;    SOCIAL HISTORY: Social History   Socioeconomic History  . Marital status: Single    Spouse name: Not on file  . Number of children: 2  . Years of education: Not on file  . Highest education level: Not on file  Occupational History  .  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 Smoking status: Former Smoker    Packs/day: 0.50    Years: 38.00    Pack years: 19.00    Types: Cigarettes    Quit date: 09/15/1998    Years since quitting: 21.8  . Smokeless tobacco: Never Used  Vaping Use  . Vaping Use: Never used  Substance and Sexual Activity  . Alcohol use: Not Currently  . Drug use: No  . Sexual activity: Not Currently  Other Topics Concern  . Not on file  Social History Narrative   Lives in [place]; daughter lives in [place]. Quit smoking may 14th 2000. No alcohol. Retired  From [employer] job.    Social Determinants of Health   Financial Resource Strain: Low Risk   . Difficulty of Paying  Living Expenses: Not hard at all  Food Insecurity: No Food Insecurity  . Worried About Charity fundraiser in the Last Year: Never true  . Ran Out of Food in the Last Year: Never true  Transportation Needs: No Transportation Needs  . Lack of Transportation (Medical): No  . Lack of Transportation (Non-Medical): No  Physical Activity: Not on file  Stress: No Stress Concern Present  . Feeling of Stress : Only a little  Social Connections: Unknown  . Frequency of Communication with Friends and Family: More than three times a week  . Frequency of Social Gatherings with Friends and Family: More than three times a week  . Attends Religious Services: More than 4 times per year  . Active Member of Clubs or Organizations: Yes  . Attends Archivist Meetings: More than 4 times per year  . Marital Status: Not on file  Intimate Partner Violence: Not At Risk  . Fear of Current or Ex-Partner: No  . Emotionally Abused: No  . Physically Abused: No  . Sexually Abused: No    FAMILY HISTORY: Family History  Problem Relation Age of Onset  . Lung cancer Father   . Heart disease Father   . Diverticulosis Mother   . Diabetes Mother   . Stroke Mother 20  . Autoimmune disease Daughter   . Thyroid disease Daughter   . Thyroid disease Sister   . Stomach cancer Maternal Aunt   . Breast cancer Maternal Aunt   . Colon cancer Neg Hx   . Esophageal cancer Neg Hx   . Rectal cancer Neg Hx     ALLERGIES:  has No Known Allergies.  MEDICATIONS:  Current Outpatient Medications  Medication Sig Dispense Refill  . acetaminophen (TYLENOL) 500 MG tablet Take 500 mg by mouth every 6 (six) hours as needed.    . Continuous Blood Gluc Receiver (FREESTYLE LIBRE 14 DAY READER) DEVI Use to check BS 4 times daily 2 each 2  . Continuous Blood Gluc Sensor (FREESTYLE LIBRE 14 DAY SENSOR) MISC Use to check BS 4 times daily 6 each 3  . denosumab (PROLIA) 60 MG/ML SOSY injection Inject into the skin.    Marland Kitchen  dexamethasone (DECADRON) 2 MG tablet Take 1 tablet (2 mg total) by mouth 3 (three) times daily. 45 tablet 0  . Insulin Pen Needle 32G X 6 MM MISC 1 Stick by Does not apply route daily. 100 each 1  . levETIRAcetam (KEPPRA) 500 MG tablet Take 1 tablet (500 mg total) by mouth 2 (two) times daily. 60 tablet 2  . lidocaine-prilocaine (EMLA) cream Apply 1 application topically as needed. 30-45 mins  prior to port access. 30 g 0  . losartan (COZAAR) 50 MG tablet Take 1 tablet (50 mg total) by mouth daily. 90 tablet 3  . morphine (MS CONTIN) 15 MG 12 hr tablet Take 1 tablet (15 mg total) by mouth every 8 (eight) hours. 90 tablet 0  . naloxone (NARCAN) nasal spray 4 mg/0.1 mL 1 spray into nostril upon signs of opioid overdose. Call 911. May repeat once if no response within 2-3 minutes. 1 each 0  . omeprazole (PRILOSEC) 40 MG capsule TAKE 1 CAPSULE BY MOUTH ONCE DAILY 90 capsule 1  . ondansetron (ZOFRAN) 8 MG tablet One pill every 8 hours as needed for nausea/vomitting. 90 tablet 1  . ONETOUCH ULTRA test strip USE AS INSTRUCTED 100 each 12  . oxyCODONE (ROXICODONE) 15 MG immediate release tablet Take 1-2 tablets (15-30 mg total) by mouth every 4 (four) hours as needed for pain. 90 tablet 0  . prochlorperazine (COMPAZINE) 10 MG tablet Take 1 tablet (10 mg total) by mouth every 6 (six) hours as needed for nausea or vomiting. 40 tablet 1  . rosuvastatin (CRESTOR) 10 MG tablet TAKE ONE TABLET EVERY DAY 90 tablet 1  . sucralfate (CARAFATE) 1 g tablet Take 0.5 tablets (0.5 g total) by mouth 3 (three) times daily. Dissolve in 3-4 tbsp warm water, swallow. 60 tablet 1  . triamcinolone ointment (KENALOG) 0.5 % Apply 1 application topically 2 (two) times daily. 30 g 0  . trimethoprim (TRIMPEX) 100 MG tablet Take 1 tablet (100 mg total) by mouth at bedtime. 30 tablet 11  . colchicine 0.6 MG tablet Take 1 tablet (0.6 mg total) by mouth 2 (two) times daily. (Patient not taking: No sig reported) 20 tablet 0  . furosemide  (LASIX) 40 MG tablet TAKE ONE TABLET EVERY DAY (Patient not taking: No sig reported) 90 tablet 1  . LANTUS SOLOSTAR 100 UNIT/ML Solostar Pen INJECT 20 UNITS INTO THE SKIN DAILY AS DIRECTED (Patient not taking: Reported on 07/23/2020) 15 mL PRN   No current facility-administered medications for this visit.   Facility-Administered Medications Ordered in Other Visits  Medication Dose Route Frequency Provider Last Rate Last Admin  . heparin lock flush 100 unit/mL  500 Units Intravenous Once Charlaine Dalton R, MD      . sodium chloride flush (NS) 0.9 % injection 10 mL  10 mL Intravenous Once Charlaine Dalton R, MD          .  PHYSICAL EXAMINATION: ECOG PERFORMANCE STATUS: 1 - Symptomatic but completely ambulatory  Vitals:   07/23/20 1300  BP: (!) 142/66  Pulse: 68  Resp: 18  Temp: 99.1 F (37.3 C)   There were no vitals filed for this visit.  Physical Exam Vitals and nursing note reviewed.  Constitutional:      Comments: Patient came by daughter.  She is walking independently.  HENT:     Head: Normocephalic and atraumatic.     Mouth/Throat:     Pharynx: No oropharyngeal exudate.  Eyes:     Pupils: Pupils are equal, round, and reactive to light.  Cardiovascular:     Rate and Rhythm: Normal rate and regular rhythm.  Pulmonary:     Effort: No respiratory distress.     Breath sounds: No wheezing.     Comments: Decreased air entry bilaterally left more than right. Abdominal:     General: Bowel sounds are normal. There is no distension.     Palpations: Abdomen is soft. There is no mass.  Tenderness: There is no abdominal tenderness. There is no guarding or rebound.  Musculoskeletal:        General: No tenderness. Normal range of motion.     Cervical back: Normal range of motion and neck supple.  Skin:    General: Skin is warm.  Neurological:     Mental Status: She is alert and oriented to person, place, and time.  Psychiatric:        Mood and Affect: Affect  normal.      LABORATORY DATA:  I have reviewed the data as listed Lab Results  Component Value Date   WBC 4.9 07/22/2020   HGB 12.0 07/22/2020   HCT 38.2 07/22/2020   MCV 81.6 07/22/2020   PLT 217 07/22/2020   Recent Labs    02/02/20 0925 02/03/20 0541 02/09/20 1002 06/11/20 0946 07/15/20 0916 07/22/20 1320  NA 137 133*   < > 137 137 134*  K 4.2 4.3   < > 4.0 4.8 4.4  CL 100 99   < > 102 106 103  CO2 27 24   < > 25 24 21*  GLUCOSE 177* 248*   < > 130* 115* 137*  BUN 16 18   < > 10 14 12   CREATININE 0.81 0.78   < > 0.79 1.03* 0.78  CALCIUM 9.6 9.2   < > 9.1 8.2* 8.6*  GFRNONAA >60 >60   < > >60 56* >60  GFRAA >60 >60  --   --   --   --   PROT 7.7  --    < > 6.5 6.7 6.9  ALBUMIN 4.5  --    < > 3.3* 3.6 3.7  AST 29  --    < > 15 17 14*  ALT 31  --    < > 10 12 11   ALKPHOS 43  --    < > 63 76 75  BILITOT 0.9  --    < > 0.5 0.4 0.9   < > = values in this interval not displayed.    RADIOGRAPHIC STUDIES: I have personally reviewed the radiological images as listed and agreed with the findings in the report. MR Brain W Wo Contrast  Result Date: 07/23/2020 CLINICAL DATA:  Metastatic lung cancer, worsening headaches, confusion EXAM: MRI HEAD WITHOUT AND WITH CONTRAST TECHNIQUE: Multiplanar, multiecho pulse sequences of the brain and surrounding structures were obtained without and with intravenous contrast. CONTRAST:  7.67m GADAVIST GADOBUTROL 1 MMOL/ML IV SOLN COMPARISON:  05/01/2020 noncontrast study, 02/05/2020 contrast study FINDINGS: Brain: Several presumed new lesions are identified on series 18, noting prior study was performed without contrast: Right frontal lobe (image 96) 1.7 x 1.5 cm with adjacent leptomeningeal enhancement; Right inferior parasagittal parietal lobe (image 91) 1.4 x 1.1 cm; Left temporal lobe (image 62) 0.7 cm; Right occipital lobe (image 57) 0.7 cm; Posterior right cerebellum (image 41) 1.7 x 2 cm; Lateral right cerebellum (image 33) 1.4 x 1.1 cm; Left  cerebellar tonsil (image 20) 0.8 cm Previously seen right lateral frontal lesion has a slightly different configuration but measures similar in size 1.3 x 0.8 cm. Previously seen posterior right temporal lesion measures 0.4 cm (previously 1 cm). There is edema associated with all the above. Edema associated with the prior lesions has increased. Mild effacement right lateral ventricle. No significant midline shift. There is no acute infarction or intracranial hemorrhage. There is no hydrocephalus or extra-axial fluid collection. Vascular: Major vessel flow voids at the skull base are preserved.  Right periophthalmic aneurysm is not well evaluated. Skull and upper cervical spine: Normal marrow signal is preserved. Sinuses/Orbits: Paranasal sinuses are aerated. Orbits are unremarkable. Other: Sella is unremarkable.  Mastoid air cells are clear. IMPRESSION: Approximately 7 new metastatic lesions and associated edema. No significant mass effect. A right frontal lesion has adjacent leptomeningeal enhancement. CSF sampling is recommended. Similar size of prior right frontal lesion. Decrease in size of prior right temporal lesion. Increased edema associated with both. Electronically Signed   By: Macy Mis M.D.   On: 07/23/2020 12:52   DG ESOPHAGUS W SINGLE CM (SOL OR THIN BA)  Result Date: 07/23/2020 CLINICAL DATA:  Dysphagia EXAM: ESOPHOGRAM / BARIUM SWALLOW / BARIUM TABLET STUDY TECHNIQUE: Combined double contrast and single contrast examination performed using effervescent crystals, thick barium liquid, and thin barium liquid. The patient was observed with fluoroscopy swallowing a 13 mm barium sulphate tablet. FLUOROSCOPY TIME:  Fluoroscopy Time:  54 seconds Radiation Exposure Index (if provided by the fluoroscopic device): 5.5 mGy Number of Acquired Spot Images: 0 COMPARISON:  None. FINDINGS: Premature spillage. Flash laryngeal penetration without tracheal aspiration. Contrast flowed freely through the esophagus  without evidence of a stricture or mass. Normal esophageal mucosa without evidence of irregularity or ulceration. Severe tertiary contractions of the distal half of the esophagus throughout the examination as can be seen with esophageal spasm versus presbyesophagus. Mild gastroesophageal reflux. No definite hiatal hernia was demonstrated. At the end of the examination a 13 mm barium tablet was administered which transited through the esophagus and esophagogastric junction without delay. IMPRESSION: Severe tertiary contractions of the distal half of the esophagus throughout the examination as can be seen with esophageal spasm versus presbyesophagus. Mild gastroesophageal reflux. Electronically Signed   By: Kathreen Devoid   On: 07/23/2020 14:34    ASSESSMENT & PLAN:   Primary cancer of left upper lobe of lung ([place]) #Non-small cell lung cancer/adenocarcinoma- s/p palliative radiation left upper lobe  [jan 18th, 2022]; also-SRS for brain metastases [finished October 2021] FEB 9th, 2022- PET scan- NO DISTANT METASTATIC DISEASE; improved left upper lobe mass SUV.  #However unfortunately-MRI brain 3/22-ordered for mental status changes/emotional outbursts-shows progressive disease likely 7 lesions in the brain with edema.  Recommend starting-dexamethasone 2 mg 3 times daily [history of poorly controlled blood sugars on steroids/neck swelling].  Discussed with Dr. Sonny Masters whole brain radiation.  # With regards to systemic therapy-options include chemotherapy-immunotherapy; single agent Keytruda [PD-L1 95%]. + However daughter patient extremely concerned about declining quality of life-while on therapies.   # ? Tumor fevers/sweating- every day [99 or 100.1]- improves with Tylenol prn.  # ? Difficulty in swallowing solids/liquids-question radiation-induced-awaiting esophagogram.  # Back pain likely degenerative-worse; MS Contin 15 mg 3 times daily; oxycodone 15 mg for breakthrough pain medication.   Discussed with [name].  # Solitary kidney-normal renal function-  UA-clinically not infection/UTI especially given history of MDR colonization.STABLE.  # IV access- s/p port placement  #Prognosis: Patient is at high risk of complications from systemic therapy-given her multiple medical problems/declining performance status/risk of falls.  Discussed with the patient and daughter that unfortunately prognosis is poor especially given the rapid recurrence of brain metastases status post SRS.  Life expectancy is in the order of few months.  Discussed that hospice would be recommended if patient performed self declines/worsens.  Discussed with [employer].   # 40 minutes face-to-face with the patient discussing the above plan of care; more than 50% of time spent on prognosis/ natural history; counseling  and coordination.  # DISPOSITION: # follow up TBD-Dr.B     All questions were answered. The patient knows to call the clinic with any problems, questions or concerns.    [name], MD 07/23/2020 8:44 PM

## 2020-07-24 ENCOUNTER — Telehealth: Payer: Self-pay | Admitting: Pharmacist

## 2020-07-24 ENCOUNTER — Ambulatory Visit
Admission: RE | Admit: 2020-07-24 | Discharge: 2020-07-24 | Disposition: A | Discharge status: Home / Self Care | Payer: PPO | Source: Ambulatory Visit | Attending: Radiation Oncology | Admitting: Radiation Oncology

## 2020-07-24 ENCOUNTER — Telehealth: Payer: Self-pay | Admitting: Internal Medicine

## 2020-07-24 ENCOUNTER — Ambulatory Visit (INDEPENDENT_AMBULATORY_CARE_PROVIDER_SITE_OTHER): Payer: PPO | Admitting: Pharmacist

## 2020-07-24 DIAGNOSIS — C7931 Secondary malignant neoplasm of brain: Secondary | ICD-10-CM | POA: Diagnosis not present

## 2020-07-24 DIAGNOSIS — E119 Type 2 diabetes mellitus without complications: Secondary | ICD-10-CM | POA: Diagnosis not present

## 2020-07-24 DIAGNOSIS — C3412 Malignant neoplasm of upper lobe, left bronchus or lung: Secondary | ICD-10-CM

## 2020-07-24 DIAGNOSIS — C3492 Malignant neoplasm of unspecified part of left bronchus or lung: Secondary | ICD-10-CM | POA: Diagnosis not present

## 2020-07-24 DIAGNOSIS — E785 Hyperlipidemia, unspecified: Secondary | ICD-10-CM

## 2020-07-24 DIAGNOSIS — Z794 Long term (current) use of insulin: Secondary | ICD-10-CM

## 2020-07-24 DIAGNOSIS — E1169 Type 2 diabetes mellitus with other specified complication: Secondary | ICD-10-CM | POA: Diagnosis not present

## 2020-07-24 NOTE — Telephone Encounter (Signed)
Patient's daughter returned call. See CCM documentation

## 2020-07-24 NOTE — Patient Instructions (Signed)
Visit Information  PATIENT GOALS: Goals Addressed              This Visit's Progress     Patient Stated   .  Medication Monitoring (pt-stated)        Patient Goals/Self-Care Activities . Over the next 90 days, patient will:  - check blood glucose using CGM at least three times daily, document, and provide at future appointments        Patient verbalizes understanding of instructions provided today and agrees to view in [place].    Plan: Telephone follow up appointment with care management team member scheduled for:  ~ 2 weeks  Catie Darnelle Maffucci, PharmD, [place], [place] Clinical Pharmacist [employer] [facility] [employer] [phone]

## 2020-07-24 NOTE — Chronic Care Management (AMB) (Signed)
Chronic Care Management Pharmacy Note  07/24/2020 Name:  Kimberly Huff MRN:  [id number] DOB:  [birth year]  Subjective: Kimberly Huff is an 75 y.o. year old female who is a primary patient of Derrel Nip, Aris Everts, MD.  The CCM team was consulted for assistance with disease management and care coordination needs.    Engaged with patient daughter, [name], by telephone for follow up visit in response to provider referral for pharmacy case management and/or care coordination services.   Consent to Services:  The patient was given information about Chronic Care Management services, agreed to services, and gave verbal consent prior to initiation of services.  Please see initial visit note for detailed documentation.   Patient Care Team: [name], MD as PCP - General (Internal Medicine) [name], MD (General Surgery) [name], MD (Internal Medicine) Telford Nab, RN as Oncology Nurse Navigator [name], RPH-CPP (Pharmacist)  Recent office visits: None since last visit  Recent consult visits:  3/22 - Dr. Rogue Bussing oncology, Dr. Baruch Gouty radiation; brain mets from non-small cell lung cancer - started decadron w/ radiation to brain  Hospital visits: None in previous 6 months  Objective:  Lab Results  Component Value Date   CREATININE 0.78 07/22/2020   CREATININE 1.03 (H) 07/15/2020   CREATININE 0.79 06/11/2020    Lab Results  Component Value Date   HGBA1C 7.3 (H) 02/02/2020   Last diabetic Eye exam:  Lab Results  Component Value Date/Time   HMDIABEYEEXA No Retinopathy 10/02/2016 12:00 AM    Last diabetic Foot exam:  Lab Results  Component Value Date/Time   HMDIABFOOTEX normal 03/07/2014 12:00 AM        Component Value Date/Time   CHOL 101 01/29/2020 0856   TRIG 201.0 (H) 01/29/2020 0856   HDL 25.20 (L) 01/29/2020 0856   CHOLHDL 4 01/29/2020 0856   VLDL 40.2 (H) 01/29/2020 0856   LDLCALC 38 10/04/2019 0802   LDLDIRECT 58.0  01/29/2020 0856    Hepatic Function Latest Ref Rng & Units 07/22/2020 07/15/2020 06/11/2020  Total Protein 6.5 - 8.1 g/dL 6.9 6.7 6.5  Albumin 3.5 - 5.0 g/dL 3.7 3.6 3.3(L)  AST 15 - 41 U/L 14(L) 17 15  ALT 0 - 44 U/L 11 12 10   Alk Phosphatase 38 - 126 U/L 75 76 63  Total Bilirubin 0.3 - 1.2 mg/dL 0.9 0.4 0.5    Lab Results  Component Value Date/Time   TSH 0.76 09/10/2014 05:01 PM   TSH 0.83 10/25/2013 08:04 AM    CBC Latest Ref Rng & Units 07/22/2020 07/15/2020 06/11/2020  WBC 4.0 - 10.5 K/uL 4.9 5.4 4.2  Hemoglobin 12.0 - 15.0 g/dL 12.0 11.4(L) 11.2(L)  Hematocrit 36.0 - 46.0 % 38.2 36.8 35.7(L)  Platelets 150 - 400 K/uL 217 188 194    Lab Results  Component Value Date/Time   VD25OH 19.71 (L) 06/29/2019 08:19 AM   VD25OH 46.54 01/24/2015 09:34 AM    Clinical ASCVD: Yes    Social History   Tobacco Use  Smoking Status Former Smoker  . Packs/day: 0.50  . Years: 38.00  . Pack years: 19.00  . Types: Cigarettes  . Quit date: 09/15/1998  . Years since quitting: 21.8  Smokeless Tobacco Never Used   BP Readings from Last 3 Encounters:  07/23/20 (!) 142/66  07/22/20 (!) 114/58  07/15/20 119/79   Pulse Readings from Last 3 Encounters:  07/23/20 68  07/22/20 66  07/15/20 77   Wt Readings from  Last 3 Encounters:  07/22/20 165 lb 2 oz (74.9 kg)  07/15/20 169 lb (76.7 kg)  07/01/20 168 lb (76.2 kg)    Assessment: Review of patient past medical history, allergies, medications, health status, including review of consultants reports, laboratory and other test data, was performed as part of comprehensive evaluation and provision of chronic care management services.   SDOH:  (Social Determinants of Health) assessments and interventions performed: none today   CCM Care Plan  No Known Allergies  Medications Reviewed Today    Reviewed by Gloris Ham, RN (Registered Nurse) on 07/23/20 at 68  Med List Status: <None>  Medication Order Taking? Sig Documenting Provider  Last Dose Status Informant  acetaminophen (TYLENOL) 500 MG tablet [id number]  Take 500 mg by mouth every 6 (six) hours as needed. [provider]  Active   colchicine 0.6 MG tablet [id number]  Take 1 tablet (0.6 mg total) by mouth 2 (two) times daily.  Patient not taking: Reported on 07/15/2020   [name], MD  Active   Continuous Blood Gluc Receiver (FREESTYLE LIBRE 14 DAY READER) [place] [id number]  Use to check BS 4 times daily [name], MD  Active   Continuous Blood Gluc Sensor (FREESTYLE LIBRE 14 DAY SENSOR) [place] [id number]  Use to check BS 4 times daily [name], MD  Active   denosumab (PROLIA) 60 MG/ML SOSY injection [id number]  Inject into the skin. [provider]  Active   furosemide (LASIX) 40 MG tablet [id number]  TAKE ONE TABLET EVERY DAY  Patient not taking: Reported on 07/15/2020   [name], MD  Active   heparin lock flush 100 unit/mL [id number]   [name], MD  Active   Insulin Pen Needle 32G X 6 MM MISC [id number]  1 Stick by Does not apply route daily. [name], MD  Active   LANTUS SOLOSTAR 100 UNIT/ML Solostar Pen [id number]  INJECT 20 UNITS INTO THE SKIN DAILY AS DIRECTED  Patient taking differently: Inject 15 Units into the skin daily.   [name], MD  Active   levETIRAcetam (KEPPRA) 500 MG tablet [id number]  Take 1 tablet (500 mg total) by mouth 2 (two) times daily. Borders, [name], NP  Active   lidocaine-prilocaine (EMLA) cream [id number]  Apply 1 application topically as needed. 30-45 mins prior to port access. [name], MD  Active   losartan (COZAAR) 50 MG tablet [id number]  Take 1 tablet (50 mg total) by mouth daily. [name], MD  Active   morphine (MS CONTIN) 15 MG 12 hr tablet [id number]  Take 1 tablet (15 mg total) by mouth every 8 (eight) hours. Borders, [name], NP  Active   naloxone [facility]) nasal spray 4 mg/0.1 mL [id number]  1 spray into nostril upon signs of opioid overdose. Call  911. May repeat once if no response within 2-3 minutes.  Patient not taking: No sig reported   Borders, [name], NP  Active   omeprazole (PRILOSEC) 40 MG capsule [id number]  TAKE 1 CAPSULE BY MOUTH ONCE DAILY [name], MD  Active   ondansetron (ZOFRAN) 8 MG tablet [id number]  One pill every 8 hours as needed for nausea/vomitting. Borders, [name], NP  Active   ONETOUCH ULTRA test strip [id number]  USE AS INSTRUCTED [name], MD  Active   oxyCODONE (ROXICODONE) 15 MG immediate release tablet [id number]  Take 1-2 tablets (15-30 mg total) by mouth every 4 (four) hours  as needed for pain. Borders, [name], NP  Active   prochlorperazine (COMPAZINE) 10 MG tablet [id number]  Take 1 tablet (10 mg total) by mouth every 6 (six) hours as needed for nausea or vomiting.  Patient not taking: Reported on 07/15/2020   [name], MD  Active   rosuvastatin (CRESTOR) 10 MG tablet [id number]  TAKE ONE TABLET EVERY DAY [name], MD  Active   sodium chloride flush (NS) 0.9 % injection 10 mL [id number]   [name], MD  Active   sucralfate (CARAFATE) 1 g tablet [id number]  Take 0.5 tablets (0.5 g total) by mouth 3 (three) times daily. Dissolve in 3-4 tbsp warm water, swallow.  Patient not taking: No sig reported   [name], MD  Active            Med Note Nat Christen Apr 18, 2020  3:46 PM) Taking PRN  triamcinolone ointment (KENALOG) 0.5 % [id number]  Apply 1 application topically 2 (two) times daily.  Patient not taking: No sig reported   Borders, [name], NP  Active   trimethoprim (TRIMPEX) 100 MG tablet [id number]  Take 1 tablet (100 mg total) by mouth at bedtime. McKenzie, Candee Furbish, MD  Active           Patient Active Problem List   Diagnosis Date Noted  . Aortic arch atherosclerosis ([place]) 03/19/2020  . Aortic aneurysm ([place]) 03/19/2020  . Primary cancer of left upper lobe of lung ([place]) 02/26/2020  . Aneurysm of cavernous portion of right internal  carotid artery 02/03/2020  . Brain mass 02/02/2020  . Brain metastasis ([place]) 02/02/2020  . Obesity (BMI 30.0-34.9)   . Back pain, chronic 01/22/2020  . OAB (overactive bladder) 12/13/2019  . Chronic cystitis 10/08/2019  . Pelvic mass in female 03/28/2019  . Thoracic spondylosis without myelopathy 03/28/2019  . Osteoporosis 11/12/2017  . Urge incontinence of urine 02/25/2016  . Hospital discharge follow-up 10/26/2015  . Benign essential HTN 10/25/2015  . Atrophic kidney 10/25/2015  . History of myocardial infarction 10/25/2015  . Microalbuminuria due to type 2 diabetes mellitus ([place]) 06/29/2015  . Personal history of gastric ulcer 02/23/2013  . Nontraumatic shoulder pain, right 02/23/2013  . Vitamin D deficiency 02/23/2013  . History of right humeral fracture  02/16/2012  . Gouty arthritis 08/25/2011  . Type 2 diabetes mellitus, controlled, with renal complications ([place]) 30/16/0109  . Hemorrhoid 05/27/2011  . Dyslipidemia associated with type 2 diabetes mellitus ([place]) 02/16/2011  . Cataract extraction status 02/16/2011  . Nonfunctioning left kidney 02/16/2011  . Cirrhosis, non-alcoholic ([place]) 32/35/5732    Immunization History  Administered Date(s) Administered  . Hepatitis B 02/16/2011  . Influenza, High Dose Seasonal PF 03/18/2018, 04/14/2019  . Influenza,inj,Quad PF,6+ Mos 02/22/2013, 03/07/2014  . Influenza-Unspecified 02/05/2015, 02/17/2016  . Moderna Sars-Covid-2 Vaccination 03/19/2020  . PFIZER(Purple Top)SARS-COV-2 Vaccination 05/09/2019, 05/30/2019  . Pneumococcal Conjugate-13 03/07/2014  . Pneumococcal Polysaccharide-23 03/16/2013, 07/12/2018  . Tdap 02/22/2013    Conditions to be addressed/monitored: HTN, HLD, DMII and Cancer  Care Plan : Medication Management  Updates made by [name], RPH-CPP since 07/24/2020 12:00 AM    Problem: Diabetes, Cancer     Long-Range Goal: Disease State Management   Start Date: 03/19/2020  Recent Progress: On track   Priority: High  Note:   Current Barriers:  . Unable to achieve control of diabetes  . Recent complex diagnosis  Pharmacist Clinical Goal(s):  Marland Kitchen Over the next 90 days,  patient will achieve control of diabetes as evidenced by improvement in A1c.  Interventions: . 1:1 collaboration with [name], MD regarding development and update of comprehensive plan of care as evidenced by provider attestation and co-signature . Inter-disciplinary care team collaboration (see longitudinal plan of care) . Comprehensive medication review performed; medication list updated in electronic medical record  Diabetes: . Controlled per CGM download; current treatment: Lantus 15 units daily;  o Hx metformin, d/c d/t GI upset and concurrent radiation thearpy  . Dexamethasone 2 mg TID started yesterday by oncology for brain mets.  . Current glucose readings- using Libre 14 Day CGM Date of Download: 2/10-3/23/22 % Time CGM is active: 49% Average Glucose: 116 mg/dL Glucose Variability: 27.6 (goal <36%) Time in Goal:  - Time in range 70-180: 94% - Time above range: 5% - Time below range: 0% . Discussed w/ daughter, [name]. Will continue current regimen at this time and evaluate impact of dexamethasone on glucose readings. Will adjust Lantus if needed. [name] will call me if any concerns with hypo or hyperglycemic patterns in the meantime  Hypertension: . Controlled per last clinic readings; current treatment: losartan 50 mg daily, furosemide 40 mg PRN . Recommended to continue current regimen  Hyperlipidemia: . Controlled per last lab work; current treatment: rosuvastatin 10 mg daily . Recommended to continue current treatment regimen  Lung Cancer with Brain Mets, Symptom Management: . Seizure (brain metastasis): levetiracetam 500 mg BID . Nausea: ondansetron 4 mg PRN, procholorperazine PRN. Taking ondansetron ~2-3 times daily lately . Pain: morphine ER 15 mg Q8H PRN, oxycodone 15 mg 1-2 tab Q4H PRN,  acetaminophen 325 mg PRN fever.  . Constipation: senna/docusate PRN. . Mood/sleep: trazodone 50 mg daily- not taking since starting on morphine . Provided empathetic listening to daughter. Discussed palliative care referral. Daughter will continue to consider.   GERD: . Controlled per patient report. Current treatment: omeprazole 40 mg daily, sucralfate 0.5 g PRN acid related swallowing issues, though infrequent use.  Marland Kitchen Recommended to continue current regimen  UTI Ppx: . Appropriately managed; likely colonized; trimethoprim 100 mg QPM . Continue current regimen for symptom management  Gout Flare: . Improved. S/p treatment with colchicine 0.6 mg BID PRN . Continue to discuss additional non pharmacologic treatment.   Patient Goals/Self-Care Activities . Over the next 90 days, patient will:  - check blood glucose using CGM at least three times daily, document, and provide at future appointments  Follow Up Plan: Telephone follow up appointment with care management team member scheduled for: ~2 weeks      Medication Assistance: None required.  Patient affirms current coverage meets needs.  Patient's preferred pharmacy is:  [facility] - [place], [place] - [address] [place] [address] Phone: [phone] Fax: [phone]   Follow Up:  Patient agrees to Care Plan and Follow-up.  Plan: Telephone follow up appointment with care management team member scheduled for:  ~ 2 weeks  Catie Darnelle Maffucci, PharmD, [place], [place] Clinical Pharmacist [employer] at [employer] [phone]

## 2020-07-24 NOTE — Telephone Encounter (Signed)
On 3/23-I spoke to patient's daughter Kimberly Huff regarding the results of the esophagogram-likely secondary to reflux/question esophageal spasm.  Recommend Prilosec 40 mg twice a day; currently taking once a day.  Patient currently on dexamethasone 2 mg 3 times daily; mental status changes stable/no significant improvement or worsening.  Blood sugars controlled/sliding scale as per PCP.  Discussed with patient's daughter to let us know if patient mental status changes do not improve or worsen. I would then recommend increasing the steroids to 4 mg 3 times a day.  The daughter will report/update US of the symptoms on 2/24.   GB  FYI-Hayley.

## 2020-07-24 NOTE — Telephone Encounter (Signed)
  Chronic Care Management   Note  07/24/2020 Name: Kimberly Huff MRN: [id number] DOB: 09-27-1945   Attempted to contact patient for scheduled appointment for medication management support. Patient has 11 am appointment at [facility].   Of note for glycemic management, oral dexamethasone 2 mg TID started yesterday.   Left voicemail for patient's daughter to return my call at her convenience.   Plan: - If I do not hear back from the patient by end of business today, will collaborate with Care Guide to outreach to schedule follow up with me   Catie Darnelle Maffucci, PharmD, [place], CPP Clinical Pharmacist [employer] at [place]   [facility]

## 2020-07-25 DIAGNOSIS — C7931 Secondary malignant neoplasm of brain: Secondary | ICD-10-CM | POA: Diagnosis not present

## 2020-07-25 DIAGNOSIS — C3492 Malignant neoplasm of unspecified part of left bronchus or lung: Secondary | ICD-10-CM | POA: Diagnosis not present

## 2020-07-25 LAB — URINE CULTURE: Culture: >=100000 — AB

## 2020-07-25 NOTE — Telephone Encounter (Signed)
Thank you, Hayley. GB

## 2020-07-25 NOTE — Telephone Encounter (Signed)
Received update from pt's daughter that her mental status has not worsened since yesterday but she has not noticed any improvement either. Pt currently taking decadron 65m TID. Per MD recommendations, pt may increase to 486mTID. Recommendations given to pt's daughter and advised to call back if no improvement.

## 2020-07-26 ENCOUNTER — Telehealth: Payer: Self-pay | Admitting: *Deleted

## 2020-07-26 NOTE — Telephone Encounter (Signed)
I called and spoke with patient's daughter.  She continues to endorse confusion.  Patient is pleasantly confused and thinks that she is at the beach.  No agitation reported.  No fever or chills.  No UTI symptoms.  Likely secondary to her brain mets.  Dose of dexamethasone was increased yesterday.  This may be a new baseline for patient or she may improve with whole brain radiation.  Daughter is committed to the current treatment plan but states that patient has said repeatedly in the past that she would likely forego future cancer treatment if she were chronically confused.

## 2020-07-26 NOTE — Telephone Encounter (Signed)
Kimberly Huff called asking to speak with Kimberly Dove, NP regarding something new going on with Kimberly Huff. She reports that patient has multiple new brain lesions and was seen yesterday by Dr B and that he doubled her steroid dose, at 4 this morning, the patient got up confused and thinking she is at the beach. Kimberly Huff states that usually when she gets confused, she can get her to realize it and come back to reality, but she is not today. Patient insists she is at the beach and even wanted to go outside to hear the ocean; and told Kimberly Huff that she could hear it a little bit and asked her couldn't she hear it. Kimberly Huff is asking what can be done, is this something that she is going to have to live with or what. I did explain to her that the steroids should help, but no guarantee that she would. She would like to discuss with [name].

## 2020-07-29 ENCOUNTER — Ambulatory Visit: Admission: RE | Admit: 2020-07-29 | Payer: PPO | Source: Ambulatory Visit

## 2020-07-29 ENCOUNTER — Other Ambulatory Visit: Payer: Self-pay | Admitting: Hospice and Palliative Medicine

## 2020-07-29 DIAGNOSIS — Z515 Encounter for palliative care: Secondary | ICD-10-CM

## 2020-07-29 DIAGNOSIS — C7931 Secondary malignant neoplasm of brain: Secondary | ICD-10-CM | POA: Diagnosis not present

## 2020-07-29 DIAGNOSIS — C3492 Malignant neoplasm of unspecified part of left bronchus or lung: Secondary | ICD-10-CM | POA: Diagnosis not present

## 2020-07-29 MED ORDER — OXYCODONE HCL 15 MG PO TABS: 15.0000 mg | ORAL_TABLET | ORAL | 0 refills | Status: DC | PRN

## 2020-07-29 NOTE — Progress Notes (Signed)
I received a call from patient's daughter. She says that patient is now confused chronically. No other acute symptoms including urinary complaints. Family is now staying with her 24/7. Daughter recognizes that this may be patient's new cognitive baseline. She has requested home palliative care and bereavement counseling. Will refer to [facility].   Daughter also requested refill of oxycodone.

## 2020-07-30 ENCOUNTER — Other Ambulatory Visit: Payer: Self-pay | Admitting: Internal Medicine

## 2020-07-30 ENCOUNTER — Ambulatory Visit
Admission: RE | Admit: 2020-07-30 | Discharge: 2020-07-30 | Disposition: A | Discharge status: Home / Self Care | Payer: PPO | Source: Ambulatory Visit | Attending: Radiation Oncology | Admitting: Radiation Oncology

## 2020-07-30 DIAGNOSIS — C7931 Secondary malignant neoplasm of brain: Secondary | ICD-10-CM | POA: Diagnosis not present

## 2020-07-31 ENCOUNTER — Telehealth: Payer: Self-pay | Admitting: Adult Health Nurse Practitioner

## 2020-07-31 ENCOUNTER — Other Ambulatory Visit: Payer: Self-pay | Admitting: *Deleted

## 2020-07-31 ENCOUNTER — Ambulatory Visit
Admission: RE | Admit: 2020-07-31 | Discharge: 2020-07-31 | Disposition: A | Discharge status: Home / Self Care | Payer: PPO | Source: Ambulatory Visit | Attending: Radiation Oncology | Admitting: Radiation Oncology

## 2020-07-31 DIAGNOSIS — C7931 Secondary malignant neoplasm of brain: Secondary | ICD-10-CM | POA: Diagnosis not present

## 2020-07-31 NOTE — Telephone Encounter (Signed)
Called daughter, Carlisle Beers, to offer to schedule a Palliative Consult, no answer - left message with reason for call along with my name and call back number, requesting a return call.

## 2020-08-01 ENCOUNTER — Other Ambulatory Visit: Payer: Self-pay

## 2020-08-01 ENCOUNTER — Telehealth: Payer: Self-pay | Admitting: Internal Medicine

## 2020-08-01 ENCOUNTER — Telehealth: Payer: Self-pay

## 2020-08-01 ENCOUNTER — Telehealth: Payer: Self-pay | Admitting: *Deleted

## 2020-08-01 ENCOUNTER — Ambulatory Visit
Admission: RE | Admit: 2020-08-01 | Discharge: 2020-08-01 | Disposition: A | Discharge status: Home / Self Care | Payer: PPO | Source: Ambulatory Visit | Attending: Radiation Oncology | Admitting: Radiation Oncology

## 2020-08-01 DIAGNOSIS — C7931 Secondary malignant neoplasm of brain: Secondary | ICD-10-CM | POA: Diagnosis not present

## 2020-08-01 DIAGNOSIS — R300 Dysuria: Secondary | ICD-10-CM

## 2020-08-01 NOTE — Telephone Encounter (Signed)
On 3/30-I called patient daughter [name] to check on patient.  As per the daughter patient is still pleasantly confused.  Patient is currently on dexamethasone 4 mg daily.  Blood sugars are elevated but under control as per the daughter.  Currently undergoing radiation.  I had a long discussion with lisa regarding overall poor prognosis.  And if patient's performance status her mentation does not improve-hospice would be recommended.   On 3/31-spoke with Dr. Regenia Skeeter regarding daughter's concern for possible UTI.  Recommend urine culture.

## 2020-08-01 NOTE — Telephone Encounter (Signed)
Spoke with [name] the daughter and advise per Merrily Pew that pt needs to come in for UA and culture. Will come in tomorrow morning to give sample when she has radiation.

## 2020-08-01 NOTE — Telephone Encounter (Signed)
Spoke with patient's daughter [name] and scheduled an in-person Palliative Consult for 08/15/20 @ 11:30AM.   COVID screening was negative. No pets in home. Patient lives with alone, but daughter is staying with her at this time.   Consent obtained; updated Outlook/Netsmart/Team List and Epic.  Family is aware they may be receiving a call from NP the day before or day of to confirm appointment.

## 2020-08-01 NOTE — Telephone Encounter (Signed)
Kimberly Huff called reporting that patient is now having burning with urination and that her urine has a distinct odor to it. She said [name] told her to let him know if she develops any UTI symptoms.

## 2020-08-02 ENCOUNTER — Inpatient Hospital Stay: Payer: PPO | Attending: Internal Medicine

## 2020-08-02 ENCOUNTER — Ambulatory Visit
Admission: RE | Admit: 2020-08-02 | Discharge: 2020-08-02 | Disposition: A | Discharge status: Home / Self Care | Payer: PPO | Source: Ambulatory Visit | Attending: Radiation Oncology | Admitting: Radiation Oncology

## 2020-08-02 ENCOUNTER — Telehealth: Payer: Self-pay | Admitting: *Deleted

## 2020-08-02 ENCOUNTER — Other Ambulatory Visit: Payer: Self-pay

## 2020-08-02 DIAGNOSIS — C7931 Secondary malignant neoplasm of brain: Secondary | ICD-10-CM | POA: Diagnosis not present

## 2020-08-02 DIAGNOSIS — C3412 Malignant neoplasm of upper lobe, left bronchus or lung: Secondary | ICD-10-CM | POA: Insufficient documentation

## 2020-08-02 DIAGNOSIS — R410 Disorientation, unspecified: Secondary | ICD-10-CM | POA: Insufficient documentation

## 2020-08-02 DIAGNOSIS — I10 Essential (primary) hypertension: Secondary | ICD-10-CM | POA: Diagnosis not present

## 2020-08-02 DIAGNOSIS — E119 Type 2 diabetes mellitus without complications: Secondary | ICD-10-CM | POA: Diagnosis not present

## 2020-08-02 DIAGNOSIS — M549 Dorsalgia, unspecified: Secondary | ICD-10-CM | POA: Insufficient documentation

## 2020-08-02 DIAGNOSIS — R300 Dysuria: Secondary | ICD-10-CM

## 2020-08-02 LAB — URINALYSIS, COMPLETE (UACMP) WITH MICROSCOPIC
Bilirubin Urine: NEGATIVE
Glucose, UA: NEGATIVE mg/dL
Hgb urine dipstick: NEGATIVE
Ketones, ur: 5 mg/dL — AB
Nitrite: NEGATIVE
Protein, ur: 30 mg/dL — AB
Specific Gravity, Urine: 1.028 (ref 1.005–1.030)
pH: 5 (ref 5.0–8.0)

## 2020-08-02 NOTE — Telephone Encounter (Signed)
We will need to await results of culture.  Patient is known colonizer with chronic bacteriuria.

## 2020-08-02 NOTE — Telephone Encounter (Signed)
[name] called stating that patient lab results looks like she has a UTI and is asking if antibiotics are going to be orderd

## 2020-08-04 LAB — URINE CULTURE: Culture: >=100000 — AB

## 2020-08-05 ENCOUNTER — Telehealth: Payer: Self-pay | Admitting: *Deleted

## 2020-08-05 ENCOUNTER — Ambulatory Visit
Admission: RE | Admit: 2020-08-05 | Discharge: 2020-08-05 | Disposition: A | Discharge status: Home / Self Care | Payer: PPO | Source: Ambulatory Visit | Attending: Radiation Oncology | Admitting: Radiation Oncology

## 2020-08-05 ENCOUNTER — Telehealth: Payer: Self-pay | Admitting: Hospice and Palliative Medicine

## 2020-08-05 DIAGNOSIS — C3492 Malignant neoplasm of unspecified part of left bronchus or lung: Secondary | ICD-10-CM | POA: Diagnosis not present

## 2020-08-05 DIAGNOSIS — C7931 Secondary malignant neoplasm of brain: Secondary | ICD-10-CM | POA: Diagnosis not present

## 2020-08-05 MED ORDER — MORPHINE SULFATE ER 30 MG PO TBCR: 30.0000 mg | EXTENDED_RELEASE_TABLET | Freq: Three times a day (TID) | ORAL | 0 refills | Status: DC

## 2020-08-05 MED ORDER — OXYCODONE HCL 20 MG PO TABS: 20.0000 mg | ORAL_TABLET | ORAL | 0 refills | Status: DC | PRN

## 2020-08-05 NOTE — Telephone Encounter (Signed)
Daughter gave patient additional medication "oxycodone" over the weekend to keep the patient comfortable. She is giving her 2 and sometimes 3 tablets every 4 hours for pain. Patient's pain tolerance is going up. She did try and score the 15 mg tablets, but the tablets kept falling on the floor. She would like to try Oxycodone 20 mg dosing every 4 hours pain. She thinks this may be a better option.  Also, patient's UTI symptoms are better today, but pt still has ecoli and culture demonstrates that she is resistant to most antibiotics.  She wants [name]'s advise on the above and still wants to talk to [facility] directly.

## 2020-08-05 NOTE — Telephone Encounter (Signed)
Spoke with patient's daughter by phone.  Over the weekend, she significantly dose increased patient's oxycodone for comfort.  She was giving 3 tablets of oxycodone (45 mg) every 4 hours around-the-clock.  At this dose and frequency, she reports that patient was mostly pain-free without any significant adverse effects such as sedation, worsening confusion, or constipation.  She denies any fever or chills.  She reports improved urinary symptoms.  Urinary culture resulted with pan resistant E. coli consistent with past several samples.  Patient has history of consultation with both ID and urology and is most likely colonized.  Discussed with Dr. Rogue Bussing.  Given improvement in symptoms, will hold off on treatment.  Most likely, would have to bring patient into the clinic for IM ceftriaxone if she becomes symptomatic.  PDMP reviewed.  Plan: -Increase MS Contin 30 mg every 8 (#90) -Increase oxycodone 20 to 40 mg every 4 hours as needed for breakthrough pain (#90) -Continue daily bowel regimen -Monitor for UTI symptoms -Follow-up as previously scheduled

## 2020-08-05 NOTE — Telephone Encounter (Signed)
[name] called requesting Sharion Dove, NP call her ASAP as she has some new concerns that has come up over the weekend she wants to discuss with him

## 2020-08-06 ENCOUNTER — Inpatient Hospital Stay: Payer: PPO

## 2020-08-06 ENCOUNTER — Other Ambulatory Visit: Payer: Self-pay

## 2020-08-06 ENCOUNTER — Inpatient Hospital Stay (HOSPITAL_BASED_OUTPATIENT_CLINIC_OR_DEPARTMENT_OTHER): Payer: PPO | Admitting: Internal Medicine

## 2020-08-06 ENCOUNTER — Ambulatory Visit
Admission: RE | Admit: 2020-08-06 | Discharge: 2020-08-06 | Disposition: A | Discharge status: Home / Self Care | Payer: PPO | Source: Ambulatory Visit | Attending: Radiation Oncology | Admitting: Radiation Oncology

## 2020-08-06 DIAGNOSIS — C7931 Secondary malignant neoplasm of brain: Secondary | ICD-10-CM | POA: Diagnosis not present

## 2020-08-06 DIAGNOSIS — C3412 Malignant neoplasm of upper lobe, left bronchus or lung: Secondary | ICD-10-CM

## 2020-08-06 LAB — COMPREHENSIVE METABOLIC PANEL
ALT: 26 U/L (ref 0–44)
AST: 15 U/L (ref 15–41)
Albumin: 3.1 g/dL — ABNORMAL LOW (ref 3.5–5.0)
Alkaline Phosphatase: 40 U/L (ref 38–126)
Anion gap: 10 (ref 5–15)
BUN: 32 mg/dL — ABNORMAL HIGH (ref 8–23)
CO2: 22 mmol/L (ref 22–32)
Calcium: 7.9 mg/dL — ABNORMAL LOW (ref 8.9–10.3)
Chloride: 102 mmol/L (ref 98–111)
Creatinine, Ser: 0.75 mg/dL (ref 0.44–1.00)
GFR, Estimated: >60 mL/min (ref >60)
Glucose, Bld: 142 mg/dL — ABNORMAL HIGH (ref 70–99)
Potassium: 4.6 mmol/L (ref 3.5–5.1)
Sodium: 134 mmol/L — ABNORMAL LOW (ref 135–145)
Total Bilirubin: 0.6 mg/dL (ref 0.3–1.2)
Total Protein: 5.8 g/dL — ABNORMAL LOW (ref 6.5–8.1)

## 2020-08-06 LAB — CBC WITH DIFFERENTIAL/PLATELET
Abs Immature Granulocytes: 0.14 10*3/uL — ABNORMAL HIGH (ref 0.00–0.07)
Basophils Absolute: 0 10*3/uL (ref 0.0–0.1)
Basophils Relative: 0 %
Eosinophils Absolute: 0 10*3/uL (ref 0.0–0.5)
Eosinophils Relative: 0 %
HCT: 38.8 % (ref 36.0–46.0)
Hemoglobin: 12.1 g/dL (ref 12.0–15.0)
Immature Granulocytes: 1 %
Lymphocytes Relative: 9 %
Lymphs Abs: 1 10*3/uL (ref 0.7–4.0)
MCH: 25.7 pg — ABNORMAL LOW (ref 26.0–34.0)
MCHC: 31.2 g/dL (ref 30.0–36.0)
MCV: 82.6 fL (ref 80.0–100.0)
Monocytes Absolute: 0.6 10*3/uL (ref 0.1–1.0)
Monocytes Relative: 6 %
Neutro Abs: 9.2 10*3/uL — ABNORMAL HIGH (ref 1.7–7.7)
Neutrophils Relative %: 84 %
Platelets: 160 10*3/uL (ref 150–400)
RBC: 4.7 MIL/uL (ref 3.87–5.11)
RDW: 15.5 % (ref 11.5–15.5)
WBC: 11 10*3/uL — ABNORMAL HIGH (ref 4.0–10.5)
nRBC: 0 % (ref 0.0–0.2)

## 2020-08-06 NOTE — Assessment & Plan Note (Addendum)
#   Non-small cell lung cancer/adenocarcinoma- s/p palliative radiation left upper lobe  [jan 18th, 2022]; also-SRS for brain metastases [finished October 2021] FEB 9th, 2022- PET scan- NO DISTANT METASTATIC DISEASE; improved left upper lobe mass SUV.  #Recurrent metastatic disease to the brain-currently on whole brain radiation-until April 13th.  Recommend decreasing the dose of dexamethasone to 4 mg twice daily [blood sugars 500s]  # Back pain likely degenerative-worse; MS Contin 15 mg 3 times daily; oxycodone 15 mg for breakthrough pain medication.  Discussed with [name].  #UA-positive for E. coli multiple drug resistance/likely colonization; patient is asymptomatic.  Recommend monitoring  #Hypercalcemia-abdomen 3.2 Ca- 7.9; ca+it 1000 once a day.   # IV access- s/p port placement  #Delirium-multifactorial underlying brain metastases/steroids elevated blood sugars/alteration of sleep cycle.  Discussed with the daughter that it is quite possible that patient's cognitive function might not recover given the possibility of brain metastases/radiation side effects etc.  We will have to repeat repeat imaging in 2 months.   # DISPOSITION: # follow up on 4/13; MD; labs-port/ cbc/bmp [coordinate with radiation appt]- Dr.B

## 2020-08-06 NOTE — Progress Notes (Signed)
I connected with Kimberly Huff on 08/06/20 at  3:15 PM EDT by video enabled telemedicine visit and verified that I am speaking with the correct person using two identifiers.  I discussed the limitations, risks, security and privacy concerns of performing an evaluation and management service by telemedicine and the availability of in-person appointments. I also discussed with the patient that there may be a patient responsible charge related to this service. The patient expressed understanding and agreed to proceed.    Other persons participating in the visit and their role in the encounter: RN/medical reconciliation Patient's location: home Provider's location: office  Oncology History Overview Note  # OCT 2021-left upper lobe lung mass invading mediastinum; bronc/Bx-  Oct 20th [Dr.Aleskerov]; non-small cell carcinoma/adenocarcinoma.  Stage IV [brain metastases]; NGS-QNS; PDL-1- 95%.  #2021-PET scan left upper lobe mass; presacral question benign mass; no distant site disease  #Right frontal/temporal brain met x2 [1-1.13m]-seizure- on dex; Keppra; SBRT s/p Oct 13th, 2021  # 11/18-RT to LUL ca [? hemoptysis] s/p RT [until jan 11th, 2022]  # 2021- incidental-presacral mass-without significant SUV uptake suspect-benign etiology monitor for now.  # DM on OHA; HTN; SOLITARY KIDNEY [frequent infections/ atrophic- s/p nephrectomy 2020]; colonization-multiple drug resistance [s/p ID- Dr.Ravishankar- DEC 2021]  # NGS/MOLECULAR TESTS:NGS-QNS; guardant testing-inconclusive; PDL-1- 95%.     # PALLIATIVE CARE EVALUATION:03/18/2020  # PAIN MANAGEMENT:   DIAGNOSIS: Lung cancer  STAGE:   IV      ;  GOALS: Palliative  CURRENT/MOST RECENT THERAPY : RT    Brain metastasis ([facility]  02/02/2020 Initial Diagnosis   Brain metastasis (H[facility])   [facility]  02/26/2020 Initial Diagnosis   Primary cancer of left upper lobe of lung ([facility]   05/14/2020 Cancer Staging    Staging form: Lung, AJCC 8th Edition - Clinical: Stage IVB (cT3, pM1c) - Signed by BCammie Sickle MD on 05/14/2020      Chief Complaint: Lung cancer   History of present illness:Kimberly Huff 75y.o.  female with history o recurrent lung cancer metastatic to the brain is here for follow-up.  Patient is getting currently whole brain radiation; until April 13.  As per the daughter patient needs to be pleasantly confused intermittently.  No falls.  She does get episodes of emotional outburst.  Otherwise no nausea vomiting abdominal pain.  Her appetite is good; also mild swelling in the legs.  Patient blood sugars peak-500.  Otherwise 250s to 300s.  Currently on insulin.  Early last week patient had symptoms of UTI-UA culture done.  However symptoms resolved without using antibiotics.  Observation/objective: Alert & oriented x 3. In No acute distress.   Assessment and plan: Primary cancer of left upper lobe of lung ([facility] # Non-small cell lung cancer/adenocarcinoma- s/p palliative radiation left upper lobe  [jan 18th, 2022]; also-SRS for brain metastases [finished October 2021] FEB 9th, 2022- PET scan- NO DISTANT METASTATIC DISEASE; improved left upper lobe mass SUV.  #Recurrent metastatic disease to the brain-currently on whole brain radiation-until April 13th.  Recommend decreasing the dose of dexamethasone to 4 mg twice daily [blood sugars 500s]  # Back pain likely degenerative-worse; MS Contin 15 mg 3 times daily; oxycodone 15 mg for breakthrough pain medication.  Discussed with [name].  #UA-positive for E. coli multiple drug resistance/likely colonization; patient is asymptomatic.  Recommend monitoring  #Hypercalcemia-abdomen 3.2 Ca- 7.9; ca+it 1000 once a day.   # IV access- s/p port placement  #Delirium-multifactorial underlying brain  metastases/steroids elevated blood sugars/alteration of sleep cycle.  Discussed with the daughter that it is quite possible that patient's  cognitive function might not recover given the possibility of brain metastases/radiation side effects etc.  We will have to repeat repeat imaging in 2 months.   # DISPOSITION: # follow up on 4/13; MD; labs-port/ cbc/bmp [coordinate with radiation appt]- Dr.B    Follow-up instructions:  I discussed the assessment and treatment plan with the patient.  The patient was provided an opportunity to ask questions and all were answered.  The patient agreed with the plan and demonstrated understanding of instructions.  The patient was advised to call back or seek an in person evaluation if the symptoms worsen or if the condition fails to improve as anticipated.   Dr. Charlaine Dalton Galva at [facility] 08/06/2020 4:27 PM

## 2020-08-07 ENCOUNTER — Ambulatory Visit (INDEPENDENT_AMBULATORY_CARE_PROVIDER_SITE_OTHER): Payer: PPO | Admitting: Pharmacist

## 2020-08-07 ENCOUNTER — Ambulatory Visit
Admission: RE | Admit: 2020-08-07 | Discharge: 2020-08-07 | Disposition: A | Discharge status: Home / Self Care | Payer: PPO | Source: Ambulatory Visit | Attending: Radiation Oncology | Admitting: Radiation Oncology

## 2020-08-07 DIAGNOSIS — C3412 Malignant neoplasm of upper lobe, left bronchus or lung: Secondary | ICD-10-CM

## 2020-08-07 DIAGNOSIS — Z794 Long term (current) use of insulin: Secondary | ICD-10-CM

## 2020-08-07 DIAGNOSIS — C7931 Secondary malignant neoplasm of brain: Secondary | ICD-10-CM | POA: Diagnosis not present

## 2020-08-07 DIAGNOSIS — E119 Type 2 diabetes mellitus without complications: Secondary | ICD-10-CM

## 2020-08-07 MED ORDER — LANTUS SOLOSTAR 100 UNIT/ML ~~LOC~~ SOPN: 17.0000 [IU] | PEN_INJECTOR | Freq: Every day | SUBCUTANEOUS | 3 refills | Status: AC

## 2020-08-07 MED ORDER — INSULIN PEN NEEDLE 32G X 6 MM MISC: 3 refills | Status: AC

## 2020-08-07 NOTE — Patient Instructions (Signed)
Visit Information  PATIENT GOALS: Goals Addressed              This Visit's Progress     Patient Stated   .  Medication Monitoring (pt-stated)        Patient Goals/Self-Care Activities . Over the next 90 days, patient will:  - check blood glucose using CGM at least three times daily, document, and provide at future appointments         Patient verbalizes understanding of instructions provided today and agrees to view in [place].   Plan: Telephone follow up appointment with care management team member scheduled for:  ~ 2 weeks  Catie Darnelle Maffucci, PharmD, [place], [place] Clinical Pharmacist [employer] at [employer] [phone]

## 2020-08-07 NOTE — Chronic Care Management (AMB) (Signed)
Chronic Care Management Pharmacy Note  08/07/2020 Name:  Kimberly Huff MRN:  [id number] DOB:  [birth year]  Subjective: Kimberly Huff is an 75 y.o. year old female who is a primary patient of Kimberly Huff, Kimberly Everts, MD.  The CCM team was consulted for assistance with disease management and care coordination needs.    Engaged with patient by telephone for follow up visit in response to provider referral for pharmacy case management and/or care coordination services. Patient's daughter, Kimberly Huff, was also present for the call.  Consent to Services:  The patient was given information about Chronic Care Management services, agreed to services, and gave verbal consent prior to initiation of services.  Please see initial visit note for detailed documentation.   Patient Care Team: [name], MD as PCP - General (Internal Medicine) [name], MD (General Surgery) [name], MD (Internal Medicine) Telford Nab, RN as Oncology Nurse Navigator [name], RPH-CPP (Pharmacist)  Recent office visits: None since our last call  Recent consult visits:  4/4 phone call w/ Billey Chang, NP - pain regimen increased   4/5 - Dr. Jacinto Reap - decreased dexamethasone to 4 mg BID  Continues receiving brain radiation. Completes this treatment course next week.   Hospital visits: None in previous 6 months  Objective:  Lab Results  Component Value Date   CREATININE 0.75 08/06/2020   CREATININE 0.78 07/22/2020   CREATININE 1.03 (H) 07/15/2020    Lab Results  Component Value Date   HGBA1C 7.3 (H) 02/02/2020   Last diabetic Eye exam:  Lab Results  Component Value Date/Time   HMDIABEYEEXA No Retinopathy 10/02/2016 12:00 AM    Last diabetic Foot exam:  Lab Results  Component Value Date/Time   HMDIABFOOTEX normal 03/07/2014 12:00 AM        Component Value Date/Time   CHOL 101 01/29/2020 0856   TRIG 201.0 (H) 01/29/2020 0856   HDL 25.20 (L) 01/29/2020 0856   CHOLHDL 4  01/29/2020 0856   VLDL 40.2 (H) 01/29/2020 0856   LDLCALC 38 10/04/2019 0802   LDLDIRECT 58.0 01/29/2020 0856    Hepatic Function Latest Ref Rng & Units 08/06/2020 07/22/2020 07/15/2020  Total Protein 6.5 - 8.1 g/dL 5.8(L) 6.9 6.7  Albumin 3.5 - 5.0 g/dL 3.1(L) 3.7 3.6  AST 15 - 41 U/L 15 14(L) 17  ALT 0 - 44 U/L _0 Alk Phosphatase 38 - 126 U/L 40 75 76  Total Bilirubin 0.3 - 1.2 mg/dL 0.6 0.9 0.4    Lab Results  Component Value Date/Time   TSH 0.76 09/10/2014 05:01 PM   TSH 0.83 10/25/2013 08:04 AM    CBC Latest Ref Rng & Units 08/06/2020 07/22/2020 07/15/2020  WBC 4.0 - 10.5 K/uL 11.0(H) 4.9 5.4  Hemoglobin 12.0 - 15.0 g/dL 12.1 12.0 11.4(L)  Hematocrit 36.0 - 46.0 % 38.8 38.2 36.8  Platelets 150 - 400 K/uL 160 217 188    Lab Results  Component Value Date/Time   VD25OH 19.71 (L) 06/29/2019 08:19 AM   VD25OH 46.54 01/24/2015 09:34 AM    Clinical ASCVD: Yes    Social History   Tobacco Use  Smoking Status Former Smoker  . Packs/day: 0.50  . Years: 38.00  . Pack years: 19.00  . Types: Cigarettes  . Quit date: 09/15/1998  . Years since quitting: 21.9  Smokeless Tobacco Never Used   BP Readings from Last 3 Encounters:  07/23/20 (!) 142/66  07/22/20 (!) 114/58  07/15/20 119/79  Pulse Readings from Last 3 Encounters:  07/23/20 68  07/22/20 66  07/15/20 77   Wt Readings from Last 3 Encounters:  07/22/20 165 lb 2 oz (74.9 kg)  07/15/20 169 lb (76.7 kg)  07/01/20 168 lb (76.2 kg)    Assessment: Review of patient past medical history, allergies, medications, health status, including review of consultants reports, laboratory and other test data, was performed as part of comprehensive evaluation and provision of chronic care management services.   SDOH:  (Social Determinants of Health) assessments and interventions performed:  SDOH Interventions   Flowsheet Row Most Recent Value  SDOH Interventions   Financial Strain Interventions Intervention Not Indicated       CCM Care Plan  No Known Allergies  Medications Reviewed Today    Reviewed by [name], RPH-CPP (Pharmacist) on 08/07/20 at [phone]  Med List Status: <None>  Medication Order Taking? Sig Documenting Provider Last Dose Status Informant  acetaminophen (TYLENOL) 500 MG tablet [id number] Yes Take 500 mg by mouth every 6 (six) hours as needed. [provider] Taking Active   colchicine 0.6 MG tablet [id number]  Take 1 tablet (0.6 mg total) by mouth 2 (two) times daily.  Patient not taking: No sig reported   [name], MD  Active   Continuous Blood Gluc Receiver (FREESTYLE LIBRE 14 DAY READER) [place] [id number] Yes Use to check BS 4 times daily [name], MD Taking Active   Continuous Blood Gluc Sensor (FREESTYLE LIBRE 14 DAY SENSOR) [place] [id number] Yes Use to check BS 4 times daily [name], MD Taking Active   denosumab (PROLIA) 60 MG/ML SOSY injection [id number]  Inject into the skin. [provider]  Active   dexamethasone (DECADRON) 4 MG tablet [id number] Yes Take 1 tablet (4 mg total) by mouth 3 (three) times daily. [name], MD Taking Active            Med Note ([place]   Wed Aug 07, 2020  9:57 AM) Reduced to BID yesterday  furosemide (LASIX) 40 MG tablet [id number]  TAKE ONE TABLET EVERY DAY  Patient not taking: No sig reported   [name], MD  Active   Insulin Pen Needle 32G X 6 MM MISC [id number]  1 Stick by Does not apply route daily.  Patient not taking: Reported on 07/24/2020   [name], MD  Active   LANTUS SOLOSTAR 100 UNIT/ML Solostar Pen [id number] Yes INJECT 20 UNITS INTO THE SKIN DAILY AS DIRECTED [name], MD Taking Active            Med Note Darnelle Maffucci, Arville Lime   Wed Aug 07, 2020  9:57 AM)    levETIRAcetam (KEPPRA) 500 MG tablet [id number] Yes Take 1 tablet (500 mg total) by mouth 2 (two) times daily. Borders, [name], NP Taking Active   lidocaine-prilocaine (EMLA) cream [id number] Yes Apply 1  application topically as needed. 30-45 mins prior to port access. [name], MD Taking Active   losartan (COZAAR) 50 MG tablet [id number] Yes Take 1 tablet (50 mg total) by mouth daily. [name], MD Taking Active   morphine (MS CONTIN) 30 MG 12 hr tablet [id number] Yes Take 1 tablet (30 mg total) by mouth every 8 (eight) hours. Borders, [name], NP Taking Active   naloxone [facility]) nasal spray 4 mg/0.1 mL [id number]  1 spray into nostril upon signs of opioid overdose. Call 911. May repeat once if no response within 2-3  minutes. Borders, [name], NP  Active   omeprazole (PRILOSEC) 40 MG capsule [id number] Yes TAKE 1 CAPSULE BY MOUTH ONCE DAILY [name], MD Taking Active   ondansetron (ZOFRAN) 8 MG tablet [id number] Yes One pill every 8 hours as needed for nausea/vomitting. Borders, [name], NP Taking Active   ONETOUCH ULTRA test strip [id number]  USE AS INSTRUCTED [name], MD  Active   Oxycodone HCl 20 MG TABS [id number] Yes Take 1-2 tablets (20-40 mg total) by mouth every 4 (four) hours as needed (breakthrough pain). Borders, [name], NP Taking Active   prochlorperazine (COMPAZINE) 10 MG tablet [id number] Yes Take 1 tablet (10 mg total) by mouth every 6 (six) hours as needed for nausea or vomiting. [name], MD Taking Active   rosuvastatin (CRESTOR) 10 MG tablet [id number] Yes TAKE ONE TABLET EVERY DAY [name], MD Taking Active   sucralfate (CARAFATE) 1 g tablet [id number] Yes Take 0.5 tablets (0.5 g total) by mouth 3 (three) times daily. Dissolve in 3-4 tbsp warm water, swallow. [name], MD Taking Active            Med Note Nat Christen Apr 18, 2020  3:46 PM) Taking PRN  triamcinolone ointment (KENALOG) 0.5 % [id number] Yes Apply 1 application topically 2 (two) times daily. Borders, [name], NP Taking Active   trimethoprim (TRIMPEX) 100 MG tablet [id number] Yes Take 1 tablet (100 mg total) by mouth at bedtime. McKenzie,  Candee Furbish, MD Taking Active           Patient Active Problem List   Diagnosis Date Noted  . Aortic arch atherosclerosis ([place]) 03/19/2020  . Aortic aneurysm ([place]) 03/19/2020  . Primary cancer of left upper lobe of lung ([place]) 02/26/2020  . Aneurysm of cavernous portion of right internal carotid artery 02/03/2020  . Brain mass 02/02/2020  . Brain metastasis ([place]) 02/02/2020  . Obesity (BMI 30.0-34.9)   . Back pain, chronic 01/22/2020  . OAB (overactive bladder) 12/13/2019  . Chronic cystitis 10/08/2019  . Pelvic mass in female 03/28/2019  . Thoracic spondylosis without myelopathy 03/28/2019  . Osteoporosis 11/12/2017  . Urge incontinence of urine 02/25/2016  . Hospital discharge follow-up 10/26/2015  . Benign essential HTN 10/25/2015  . Atrophic kidney 10/25/2015  . History of myocardial infarction 10/25/2015  . Microalbuminuria due to type 2 diabetes mellitus ([place]) 06/29/2015  . Personal history of gastric ulcer 02/23/2013  . Nontraumatic shoulder pain, right 02/23/2013  . Vitamin D deficiency 02/23/2013  . History of right humeral fracture  02/16/2012  . Gouty arthritis 08/25/2011  . Type 2 diabetes mellitus, controlled, with renal complications ([place]) 19/41/7408  . Hemorrhoid 05/27/2011  . Dyslipidemia associated with type 2 diabetes mellitus ([place]) 02/16/2011  . Cataract extraction status 02/16/2011  . Nonfunctioning left kidney 02/16/2011  . Cirrhosis, non-alcoholic ([place]) 14/48/1856    Immunization History  Administered Date(s) Administered  . Hepatitis B 02/16/2011  . Influenza, High Dose Seasonal PF 03/18/2018, 04/14/2019  . Influenza,inj,Quad PF,6+ Mos 02/22/2013, 03/07/2014  . Influenza-Unspecified 02/05/2015, 02/17/2016  . Moderna Sars-Covid-2 Vaccination 03/19/2020  . PFIZER(Purple Top)SARS-COV-2 Vaccination 05/09/2019, 05/30/2019  . Pneumococcal Conjugate-13 03/07/2014  . Pneumococcal Polysaccharide-23 03/16/2013, 07/12/2018  . Tdap 02/22/2013    Conditions  to be addressed/monitored: DMII and cancer  Care Plan : Medication Management  Updates made by [name], RPH-CPP since 08/07/2020 12:00 AM    Problem: Diabetes, Cancer     Long-Range Goal: Disease State Management  Start Date: 03/19/2020  This Visit's Progress: On track  Recent Progress: On track  Priority: High  Note:   Current Barriers:  . Unable to achieve control of diabetes  . Recent complex diagnosis  Pharmacist Clinical Goal(s):  Marland Kitchen Over the next 90 days, patient will achieve control of diabetes as evidenced by improvement in A1c.  Interventions: . 1:1 collaboration with [name], MD regarding development and update of comprehensive plan of care as evidenced by provider attestation and co-signature . Inter-disciplinary care team collaboration (see longitudinal plan of care) . Comprehensive medication review performed; medication list updated in electronic medical record  Diabetes: . Uncontrolled per CGM download; current treatment: Lantus 15 units daily;  . Concurrent dexamethasone 4 mg BID, was TID until reduced yesterday o Hx metformin, d/c d/t GI upset and concurrent radiation therapy  . Current glucose readings- using Libre 14 Day CGM Date of Download: 3/24-4/6 % Time CGM is active: 78% Average Glucose: 233 mg/dL Glucose Variability: 35.4 (goal <36%) Glucose Management Indicator: 8.9% Time in Goal:  - Time in range 70-180: 73% - Time above range: 27% - Time below range: 0% . Current meal patterns: Kimberly Huff and patient report she has been eating a lot of jelly beans, ranch dip lately due to increased appetite w/ dexamethasone. Bagel with cream cheese for breakfast. Patient notes she does try to eat some protein . Increase Lantus to 17 units daily. Given prognosis, patient's dietary excursions are acceptable. Will continue to adjust medications to match diet, medication changes to keep glucose moderately well controlled.   Hypertension: . Controlled  per last clinic readings; current treatment: losartan 50 mg daily, furosemide 40 mg PRN . Recommended to continue current regimen  Hyperlipidemia: . Controlled per last lab work; current treatment: rosuvastatin 10 mg daily . Recommended to continue current treatment regimen  Lung Cancer with Brain Mets, Symptom Management: . Seizure (brain metastasis): levetiracetam 500 mg BID . Nausea: ondansetron 4 mg PRN, procholorperazine PRN. . Pain: morphine ER 30 mg Q8H PRN, oxycodone 20 mg 1-2 tab Q4H PRN- recent increased by Billey Chang, NP due to increased pain requirements.  acetaminophen 325 mg PRN fever. . Constipation: senna/docusate PRN. . Mood/sleep: trazodone 50 mg daily- not taking since starting on morphine . Brain mets: dexamethasone 4 mg BID - reduced from TID yesterday per Dr. Rogue Bussing . Continue current regimen along with oncology collaboration.   GERD: . Controlled per patient report. Current treatment: omeprazole 40 mg daily, sucralfate 0.5 g PRN acid related swallowing issues, though infrequent use.  Marland Kitchen Recommended to continue current regimen.  UTI Ppx: . Appropriately managed; likely colonized; trimethoprim 100 mg QPM . Recent report of UTI symptoms that self resolved. Oncology deferred UTI tx due to resolution of symptoms given patient's chronic colonization status. . Continue current regimen for symptom management  Gout Flare: . Improved. S/p treatment with colchicine 0.6 mg BID PRN . Continue to discuss additional non pharmacologic treatment.   Patient Goals/Self-Care Activities . Over the next 90 days, patient will:  - check blood glucose using CGM at least three times daily, document, and provide at future appointments  Follow Up Plan: Telephone follow up appointment with care management team member scheduled for: ~2 weeks       Medication Assistance: None required.  Patient affirms current coverage meets needs.  Patient's preferred pharmacy is:  [facility] - [place], [place] - [address] [place] [address] Phone: [phone] Fax: [phone]  Follow Up:  Patient agrees to Care Plan and Follow-up.  Plan: Telephone follow up appointment with care management team member scheduled for:  ~ 2 weeks  Catie Darnelle Maffucci, PharmD, [place], [place] Clinical Pharmacist [employer] at [employer] [phone]

## 2020-08-08 ENCOUNTER — Ambulatory Visit
Admission: RE | Admit: 2020-08-08 | Discharge: 2020-08-08 | Disposition: A | Discharge status: Home / Self Care | Payer: PPO | Source: Ambulatory Visit | Attending: Radiation Oncology | Admitting: Radiation Oncology

## 2020-08-08 DIAGNOSIS — C7931 Secondary malignant neoplasm of brain: Secondary | ICD-10-CM | POA: Diagnosis not present

## 2020-08-09 ENCOUNTER — Ambulatory Visit
Admission: RE | Admit: 2020-08-09 | Discharge: 2020-08-09 | Disposition: A | Discharge status: Home / Self Care | Payer: PPO | Source: Ambulatory Visit | Attending: Radiation Oncology | Admitting: Radiation Oncology

## 2020-08-09 DIAGNOSIS — C7931 Secondary malignant neoplasm of brain: Secondary | ICD-10-CM | POA: Diagnosis not present

## 2020-08-12 ENCOUNTER — Encounter: Payer: PPO | Admitting: Hospice and Palliative Medicine

## 2020-08-12 ENCOUNTER — Other Ambulatory Visit: Payer: PPO

## 2020-08-12 ENCOUNTER — Ambulatory Visit: Payer: PPO | Admitting: Internal Medicine

## 2020-08-12 ENCOUNTER — Other Ambulatory Visit: Payer: Self-pay | Admitting: *Deleted

## 2020-08-12 ENCOUNTER — Ambulatory Visit
Admission: RE | Admit: 2020-08-12 | Discharge: 2020-08-12 | Disposition: A | Discharge status: Home / Self Care | Payer: PPO | Source: Ambulatory Visit | Attending: Radiation Oncology | Admitting: Radiation Oncology

## 2020-08-12 DIAGNOSIS — C3492 Malignant neoplasm of unspecified part of left bronchus or lung: Secondary | ICD-10-CM | POA: Diagnosis not present

## 2020-08-12 DIAGNOSIS — C7931 Secondary malignant neoplasm of brain: Secondary | ICD-10-CM | POA: Diagnosis not present

## 2020-08-12 MED ORDER — OXYCODONE HCL 20 MG PO TABS: 20.0000 mg | ORAL_TABLET | ORAL | 0 refills | Status: DC | PRN

## 2020-08-13 ENCOUNTER — Other Ambulatory Visit: Payer: Self-pay | Admitting: Licensed Clinical Social Worker

## 2020-08-13 ENCOUNTER — Ambulatory Visit
Admission: RE | Admit: 2020-08-13 | Discharge: 2020-08-13 | Disposition: A | Discharge status: Home / Self Care | Payer: PPO | Source: Ambulatory Visit | Attending: Radiation Oncology | Admitting: Radiation Oncology

## 2020-08-13 ENCOUNTER — Other Ambulatory Visit: Payer: Self-pay

## 2020-08-13 DIAGNOSIS — C7931 Secondary malignant neoplasm of brain: Secondary | ICD-10-CM | POA: Diagnosis not present

## 2020-08-13 DIAGNOSIS — C3412 Malignant neoplasm of upper lobe, left bronchus or lung: Secondary | ICD-10-CM

## 2020-08-13 MED ORDER — DEXAMETHASONE 2 MG PO TABS: 2.0000 mg | ORAL_TABLET | Freq: Two times a day (BID) | ORAL | 0 refills | Status: DC

## 2020-08-13 NOTE — Progress Notes (Signed)
c 

## 2020-08-14 ENCOUNTER — Inpatient Hospital Stay: Payer: PPO

## 2020-08-14 ENCOUNTER — Inpatient Hospital Stay (HOSPITAL_BASED_OUTPATIENT_CLINIC_OR_DEPARTMENT_OTHER): Payer: PPO | Admitting: Internal Medicine

## 2020-08-14 ENCOUNTER — Other Ambulatory Visit: Payer: Self-pay

## 2020-08-14 ENCOUNTER — Ambulatory Visit
Admission: RE | Admit: 2020-08-14 | Discharge: 2020-08-14 | Disposition: A | Discharge status: Home / Self Care | Payer: PPO | Source: Ambulatory Visit | Attending: Radiation Oncology | Admitting: Radiation Oncology

## 2020-08-14 ENCOUNTER — Inpatient Hospital Stay (HOSPITAL_BASED_OUTPATIENT_CLINIC_OR_DEPARTMENT_OTHER): Payer: PPO | Admitting: Hospice and Palliative Medicine

## 2020-08-14 VITALS — BP 119/63 | HR 69 | Temp 97.5°F | Resp 18 | Wt 189.1 lb

## 2020-08-14 DIAGNOSIS — C3412 Malignant neoplasm of upper lobe, left bronchus or lung: Secondary | ICD-10-CM

## 2020-08-14 DIAGNOSIS — Z515 Encounter for palliative care: Secondary | ICD-10-CM

## 2020-08-14 DIAGNOSIS — C7931 Secondary malignant neoplasm of brain: Secondary | ICD-10-CM | POA: Diagnosis not present

## 2020-08-14 DIAGNOSIS — G893 Neoplasm related pain (acute) (chronic): Secondary | ICD-10-CM | POA: Diagnosis not present

## 2020-08-14 LAB — COMPREHENSIVE METABOLIC PANEL
ALT: 28 U/L (ref 0–44)
AST: 18 U/L (ref 15–41)
Albumin: 3.1 g/dL — ABNORMAL LOW (ref 3.5–5.0)
Alkaline Phosphatase: 36 U/L — ABNORMAL LOW (ref 38–126)
Anion gap: 8 (ref 5–15)
BUN: 25 mg/dL — ABNORMAL HIGH (ref 8–23)
CO2: 26 mmol/L (ref 22–32)
Calcium: 8.1 mg/dL — ABNORMAL LOW (ref 8.9–10.3)
Chloride: 106 mmol/L (ref 98–111)
Creatinine, Ser: 0.77 mg/dL (ref 0.44–1.00)
GFR, Estimated: >60 mL/min (ref >60)
Glucose, Bld: 147 mg/dL — ABNORMAL HIGH (ref 70–99)
Potassium: 4.6 mmol/L (ref 3.5–5.1)
Sodium: 140 mmol/L (ref 135–145)
Total Bilirubin: 0.7 mg/dL (ref 0.3–1.2)
Total Protein: 5.4 g/dL — ABNORMAL LOW (ref 6.5–8.1)

## 2020-08-14 LAB — CBC
HCT: 39.2 % (ref 36.0–46.0)
Hemoglobin: 11.9 g/dL — ABNORMAL LOW (ref 12.0–15.0)
MCH: 25.9 pg — ABNORMAL LOW (ref 26.0–34.0)
MCHC: 30.4 g/dL (ref 30.0–36.0)
MCV: 85.4 fL (ref 80.0–100.0)
Platelets: 93 10*3/uL — ABNORMAL LOW (ref 150–400)
RBC: 4.59 MIL/uL (ref 3.87–5.11)
RDW: 16.5 % — ABNORMAL HIGH (ref 11.5–15.5)
WBC: 6.9 10*3/uL (ref 4.0–10.5)
nRBC: 0 % (ref 0.0–0.2)

## 2020-08-14 MED ORDER — OLANZAPINE 10 MG PO TABS: 10.0000 mg | ORAL_TABLET | Freq: Every evening | ORAL | 2 refills | Status: AC | PRN

## 2020-08-14 NOTE — Progress Notes (Signed)
[place]  [phone] [phone]   Name: Kimberly Huff Date: 08/14/2020 MRN: [id number]  DOB: 19-Oct-1945  Patient Care Team: [name], MD as PCP - General (Internal Medicine) [name], MD (General Surgery) [name], MD (Internal Medicine) Telford Nab, RN as Oncology Nurse Navigator [name], RPH-CPP (Pharmacist)    REASON FOR CONSULTATION: Kimberly Huff is a 75 y.o. 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  Patient was hospitalized 02/02/2020-02/05/2020 with weakness and slurred speech and was found to have 8 cm left lung mass in 2 brain lesions consistent with metastatic disease.  She has had pain and insomnia.  Palliative care was consulted to help address goals and manage ongoing symptoms.  SOCIAL HISTORY:     reports that she quit smoking about 21 years ago. Her smoking use included cigarettes. She has a 19.00 pack-year smoking history. She has never used smokeless tobacco. She reports previous alcohol use. She reports that she does not use drugs.  Patient is unmarried. She was at home alone. She has a daughter in [place], [place] and a son and [place], [place]. Patient retired from the [employer] and then later worked at [employer].   ADVANCE DIRECTIVES:  On file  CODE STATUS: DNR/DNI (DNR order signed on 03/18/20)  PAST MEDICAL HISTORY: Past Medical History:  Diagnosis Date  . Allergy   . Atrophic kidney    left  . Blood clot in vein 45 yrs ago   inabdoment after surgery  . Blood transfusion without reported diagnosis   . Cataract   . Complication of anesthesia   . Coronary artery disease   . Coronary atherosclerosis   . Diabetes mellitus   . Diverticulosis 2005    Dr Jamal Collin  . Dyspnea    with exertion  . External hemorrhoids without mention of complication 5374  . GERD (gastroesophageal reflux disease)   . H/O cardiac catheterization 2005  . Heart murmur 2012   found by Dr. Jamal Collin  . Hyperlipidemia   . Hypertension   . Kidney stone   . Lesion of bladder   . Myocardial infarction ([place]) 2000  . Neuromuscular disorder ([place])    "achy muscles"  . Obesity   . Osteoarthritis   . PONV (postoperative nausea and vomiting)   . Recurrent UTI   . Seizure ([place])   . Spinal stenosis of cervical region   . Steatohepatitis 05/30/10   Brunt Stage 3, non alcoholic cirrhosis of the liver  . Type II or unspecified type diabetes mellitus without mention of complication, uncontrolled    Patient does takes Metformin and recommended to stop for 48 hours.  Marland Kitchen Ulcer   . Unspecified essential hypertension 2014    PAST SURGICAL HISTORY:  Past Surgical History:  Procedure Laterality Date  . ABDOMINAL HYSTERECTOMY  1982   complete  . BREAST CYST ASPIRATION Right 1990's   benign  . BREAST MASS EXCISION     benign  . CARDIAC CATHETERIZATION  2000   cardiac stent  . cardiac stents  2000  . CATARACT EXTRACTION     both eyes  . COLONOSCOPY  2004, 2015   Dr. Jamal Collin, Dr. Olevia Perches  . CYSTOSCOPY W/ RETROGRADES Left 11/12/2015   Procedure: CYSTOSCOPY WITH RETROGRADE PYELOGRAM;  Surgeon: Candee Furbish  McKenzie, MD;  Location: [facility];  Service: Urology;  Laterality: Left;  . CYSTOSCOPY W/ URETERAL STENT PLACEMENT Left 11/12/2015   Procedure: CYSTOSCOPY WITH STENT REPLACEMENT;  Surgeon: Cleon Gustin, MD;  Location: [facility];  Service: Urology;  Laterality: Left;  . CYSTOSCOPY WITH BIOPSY N/A 09/26/2019   Procedure: CYSTOSCOPY WITH BIOPSY AND FULGURATION;  Surgeon: Cleon Gustin, MD;  Location: [facility];  Service: Urology;  Laterality: N/A;  . CYSTOSCOPY WITH URETEROSCOPY AND STENT PLACEMENT Left 10/15/2015   Procedure: CYSTOSCOPY WITH URETEROSCOPY  AND STENT PLACEMENT;  Surgeon: Cleon Gustin, MD;  Location: [facility];  Service: Urology;  Laterality: Left;  . EYE SURGERY Bilateral 2012   cataract  . FOOT SURGERY  2008  . IR IMAGING GUIDED PORT INSERTION  05/23/2020  . PTCA  2000   stent x 1  . ROBOTIC ASSITED PARTIAL NEPHRECTOMY Left 06/02/2019   Procedure: XI ROBOTIC ASSITED NEPHRECTOMY;  Surgeon: Cleon Gustin, MD;  Location: [facility];  Service: Urology;  Laterality: Left;  . SALPINGOOPHORECTOMY    . TONSILLECTOMY    . UPPER GI ENDOSCOPY    . URETEROSCOPY WITH HOLMIUM LASER LITHOTRIPSY Left 11/12/2015   Procedure: URETEROSCOPY WITH HOLMIUM LASER LITHOTRIPSY;  Surgeon: Cleon Gustin, MD;  Location: [facility];  Service: Urology;  Laterality: Left;  . URETEROTOMY  1960/1969   x 2 during childhood  . VAGINAL HYSTERECTOMY    . VIDEO BRONCHOSCOPY WITH ENDOBRONCHIAL NAVIGATION N/A 02/21/2020   Procedure: VIDEO BRONCHOSCOPY WITH ENDOBRONCHIAL NAVIGATION;  Surgeon: Ottie Glazier, MD;  Location: [facility];  Service: Thoracic;  Laterality: N/A;  . VIDEO BRONCHOSCOPY WITH ENDOBRONCHIAL ULTRASOUND N/A 02/21/2020   Procedure: VIDEO BRONCHOSCOPY WITH ENDOBRONCHIAL ULTRASOUND;  Surgeon: Ottie Glazier, MD;  Location: [facility];  Service: Thoracic;  Laterality: N/A;    HEMATOLOGY/ONCOLOGY HISTORY:  Oncology History Overview Note  # OCT 2021-left upper lobe lung mass invading mediastinum; bronc/Bx-  Oct 20th [Dr.Aleskerov]; non-small cell carcinoma/adenocarcinoma.  Stage IV [brain metastases]; NGS-QNS; PDL-1- 95%.  #2021-PET scan left upper lobe mass; presacral question benign mass; no distant site disease  #Right frontal/temporal brain met x2 [1-1.32m]-seizure- on dex; Keppra; SBRT s/p Oct 13th, 2021  # 11/18-RT to LUL ca [? hemoptysis] s/p RT [until jan 11th, 2022]  # 2021- incidental-presacral mass-without significant SUV uptake suspect-benign etiology monitor for now.  # DM on OHA; HTN; SOLITARY KIDNEY [frequent infections/  atrophic- s/p nephrectomy 2020]; colonization-multiple drug resistance [s/p ID- Dr.Ravishankar- DEC 2021]  # NGS/MOLECULAR TESTS:NGS-QNS; guardant testing-inconclusive; PDL-1- 95%.     # PALLIATIVE CARE EVALUATION:03/18/2020  # PAIN MANAGEMENT:   DIAGNOSIS: Lung cancer  STAGE:   IV      ;  GOALS: Palliative  CURRENT/MOST RECENT THERAPY : RT    Brain metastasis ([place]  02/02/2020 Initial Diagnosis   Brain metastasis ([facility]   Primary cancer of left upper lobe of lung ([facility]  02/26/2020 Initial Diagnosis   Primary cancer of left upper lobe of lung ([facility]   05/14/2020 Cancer Staging   Staging form: Lung, AJCC 8th Edition - Clinical: Stage IVB (cT3, pM1c) - Signed by BCammie Sickle MD on 05/14/2020     ALLERGIES:  has No Known Allergies.  MEDICATIONS:  Current Outpatient Medications  Medication Sig Dispense Refill  . acetaminophen (TYLENOL) 500 MG tablet Take 500 mg by mouth every 6 (six) hours as needed.    . Continuous Blood Gluc Receiver (FREESTYLE LIBRE 14 DAY READER) DEVI Use to check BS 4 times daily 2  each 2  . Continuous Blood Gluc Sensor (FREESTYLE LIBRE 14 DAY SENSOR) MISC Use to check BS 4 times daily 6 each 3  . denosumab (PROLIA) 60 MG/ML SOSY injection Inject into the skin.    Marland Kitchen dexamethasone (DECADRON) 2 MG tablet Take 1 tablet (2 mg total) by mouth 2 (two) times daily with a meal. 30 tablet 0  . furosemide (LASIX) 40 MG tablet TAKE ONE TABLET EVERY DAY (Patient not taking: No sig reported) 90 tablet 1  . insulin glargine (LANTUS SOLOSTAR) 100 UNIT/ML Solostar Pen Inject 17 Units into the skin daily. 15 mL 3  . Insulin Pen Needle 32G X 6 MM MISC Use to inject insulin daily 100 each 3  . levETIRAcetam (KEPPRA) 500 MG tablet Take 1 tablet (500 mg total) by mouth 2 (two) times daily. 60 tablet 2  . lidocaine-prilocaine (EMLA) cream Apply 1 application topically as needed. 30-45 mins prior to port access. 30 g 0  . losartan (COZAAR) 50 MG tablet Take 1 tablet (50  mg total) by mouth daily. 90 tablet 3  . morphine (MS CONTIN) 30 MG 12 hr tablet Take 1 tablet (30 mg total) by mouth every 8 (eight) hours. 90 tablet 0  . naloxone (NARCAN) nasal spray 4 mg/0.1 mL 1 spray into nostril upon signs of opioid overdose. Call 911. May repeat once if no response within 2-3 minutes. 1 each 0  . omeprazole (PRILOSEC) 40 MG capsule TAKE 1 CAPSULE BY MOUTH ONCE DAILY 90 capsule 1  . ondansetron (ZOFRAN) 8 MG tablet One pill every 8 hours as needed for nausea/vomitting. 90 tablet 1  . ONETOUCH ULTRA test strip USE AS INSTRUCTED 100 each 12  . Oxycodone HCl 20 MG TABS Take 1-2 tablets (20-40 mg total) by mouth every 4 (four) hours as needed (breakthrough pain). 90 tablet 0  . prochlorperazine (COMPAZINE) 10 MG tablet Take 1 tablet (10 mg total) by mouth every 6 (six) hours as needed for nausea or vomiting. 40 tablet 1  . rosuvastatin (CRESTOR) 10 MG tablet TAKE ONE TABLET EVERY DAY 90 tablet 1  . sucralfate (CARAFATE) 1 g tablet Take 0.5 tablets (0.5 g total) by mouth 3 (three) times daily. Dissolve in 3-4 tbsp warm water, swallow. 60 tablet 1  . triamcinolone ointment (KENALOG) 0.5 % Apply 1 application topically 2 (two) times daily. 30 g 0   No current facility-administered medications for this visit.   Facility-Administered Medications Ordered in Other Visits  Medication Dose Route Frequency Provider Last Rate Last Admin  . heparin lock flush 100 unit/mL  500 Units Intravenous Once Charlaine Dalton R, MD      . sodium chloride flush (NS) 0.9 % injection 10 mL  10 mL Intravenous Once [name], MD        VITAL SIGNS: There were no vitals taken for this visit. There were no vitals filed for this visit.  Estimated body mass index is 33.5 kg/m as calculated from the following:   Height as of 07/01/20: 5' 3" (1.6 m).   Weight as of an earlier encounter on 08/14/20: 189 lb 1.6 oz (85.8 kg).  LABS: CBC:    Component Value Date/Time   WBC 6.9 08/14/2020  0913   HGB 11.9 (L) 08/14/2020 0913   HCT 39.2 08/14/2020 0913   PLT 93 (L) 08/14/2020 0913   MCV 85.4 08/14/2020 0913   NEUTROABS 9.2 (H) 08/06/2020 1039   LYMPHSABS 1.0 08/06/2020 1039   MONOABS 0.6 08/06/2020 1039   EOSABS  0.0 08/06/2020 1039   BASOSABS 0.0 08/06/2020 1039   Comprehensive Metabolic Panel:    Component Value Date/Time   NA 140 08/14/2020 0913   NA 140 12/15/2012 0000   K 4.6 08/14/2020 0913   CL 106 08/14/2020 0913   CO2 26 08/14/2020 0913   BUN 25 (H) 08/14/2020 0913   BUN 22 (A) 12/15/2012 0000   CREATININE 0.77 08/14/2020 0913   CREATININE 0.85 12/23/2018 1428   GLUCOSE 147 (H) 08/14/2020 0913   CALCIUM 8.1 (L) 08/14/2020 0913   AST 18 08/14/2020 0913   ALT 28 08/14/2020 0913   ALKPHOS 36 (L) 08/14/2020 0913   BILITOT 0.7 08/14/2020 0913   PROT 5.4 (L) 08/14/2020 0913   ALBUMIN 3.1 (L) 08/14/2020 0913    RADIOGRAPHIC STUDIES: MR Brain W Wo Contrast  Result Date: 07/23/2020 CLINICAL DATA:  Metastatic lung cancer, worsening headaches, confusion EXAM: MRI HEAD WITHOUT AND WITH CONTRAST TECHNIQUE: Multiplanar, multiecho pulse sequences of the brain and surrounding structures were obtained without and with intravenous contrast. CONTRAST:  7.47m GADAVIST GADOBUTROL 1 MMOL/ML IV SOLN COMPARISON:  05/01/2020 noncontrast study, 02/05/2020 contrast study FINDINGS: Brain: Several presumed new lesions are identified on series 18, noting prior study was performed without contrast: Right frontal lobe (image 96) 1.7 x 1.5 cm with adjacent leptomeningeal enhancement; Right inferior parasagittal parietal lobe (image 91) 1.4 x 1.1 cm; Left temporal lobe (image 62) 0.7 cm; Right occipital lobe (image 57) 0.7 cm; Posterior right cerebellum (image 41) 1.7 x 2 cm; Lateral right cerebellum (image 33) 1.4 x 1.1 cm; Left cerebellar tonsil (image 20) 0.8 cm Previously seen right lateral frontal lesion has a slightly different configuration but measures similar in size 1.3 x 0.8 cm.  Previously seen posterior right temporal lesion measures 0.4 cm (previously 1 cm). There is edema associated with all the above. Edema associated with the prior lesions has increased. Mild effacement right lateral ventricle. No significant midline shift. There is no acute infarction or intracranial hemorrhage. There is no hydrocephalus or extra-axial fluid collection. Vascular: Major vessel flow voids at the skull base are preserved. Right periophthalmic aneurysm is not well evaluated. Skull and upper cervical spine: Normal marrow signal is preserved. Sinuses/Orbits: Paranasal sinuses are aerated. Orbits are unremarkable. Other: Sella is unremarkable.  Mastoid air cells are clear. IMPRESSION: Approximately 7 new metastatic lesions and associated edema. No significant mass effect. A right frontal lesion has adjacent leptomeningeal enhancement. CSF sampling is recommended. Similar size of prior right frontal lesion. Decrease in size of prior right temporal lesion. Increased edema associated with both. Electronically Signed   By: PMacy MisM.D.   On: 07/23/2020 12:52   DG ESOPHAGUS W SINGLE CM (SOL OR THIN BA)  Result Date: 07/23/2020 CLINICAL DATA:  Dysphagia EXAM: ESOPHOGRAM / BARIUM SWALLOW / BARIUM TABLET STUDY TECHNIQUE: Combined double contrast and single contrast examination performed using effervescent crystals, thick barium liquid, and thin barium liquid. The patient was observed with fluoroscopy swallowing a 13 mm barium sulphate tablet. FLUOROSCOPY TIME:  Fluoroscopy Time:  54 seconds Radiation Exposure Index (if provided by the fluoroscopic device): 5.5 mGy Number of Acquired Spot Images: 0 COMPARISON:  None. FINDINGS: Premature spillage. Flash laryngeal penetration without tracheal aspiration. Contrast flowed freely through the esophagus without evidence of a stricture or mass. Normal esophageal mucosa without evidence of irregularity or ulceration. Severe tertiary contractions of the distal half  of the esophagus throughout the examination as can be seen with esophageal spasm versus presbyesophagus. Mild gastroesophageal reflux. No  definite hiatal hernia was demonstrated. At the end of the examination a 13 mm barium tablet was administered which transited through the esophagus and esophagogastric junction without delay. IMPRESSION: Severe tertiary contractions of the distal half of the esophagus throughout the examination as can be seen with esophageal spasm versus presbyesophagus. Mild gastroesophageal reflux. Electronically Signed   By: Kathreen Devoid   On: 07/23/2020 14:34    PERFORMANCE STATUS (ECOG) : 1 - Symptomatic but completely ambulatory  Review of Systems Unless otherwise noted, a complete review of systems is negative.  Physical Exam General: NAD Pulmonary: Unlabored Extremities: BLE edema, no joint deformities Skin: Erythematous rash upper back Neurological: Weakness but otherwise nonfocal  IMPRESSION: Routine follow-up visit.  Patient was accompanied by her daughter.  Patient is status post XRT.  Steroids are being weaned by Dr. Baruch Gouty.  Patient has developed bilateral lower extremity edema, likely from steroids.  Discussed conservative management with compression stockings and elevating feet.  Okay to also try furosemide for a day or two to see if this helps.  Pain is reportedly stable on current regimen of MS Contin.  Daughter is still giving oxycodone around-the-clock.  I suggested that she reduce frequency of oxycodone to evaluate effectiveness of MS Contin in controlling pain.  We discussed constipation management.  Patient endorses insomnia.  She is sleeping very little at night.  She also has some nausea during the day.  Trazodone was previously tried for sleep.  We will start her on olanzapine to help with nausea, which might also help with initiating sleep at bedtime.  PLAN: -Continue current scope of treatment -Continue MS Contin 30 mg every  8hours -Continue oxycodone 20-40 mg every 4 hours as needed for breakthrough pain -Daily bowel regimen -Start olanzapine 10 mg nightly as needed -RTC 1 month  Case and plan discussed with Dr. Rogue Bussing  Patient expressed understanding and was in agreement with this plan. She also understands that She can call the clinic at any time with any questions, concerns, or complaints.     Time Total: 15 minutes  Visit consisted of counseling and education dealing with the complex and emotionally intense issues of symptom management and palliative care in the setting of serious and potentially life-threatening illness.Greater than 50%  of this time was spent counseling and coordinating care related to the above assessment and plan.  Signed by: Altha Harm, PhD, NP-C

## 2020-08-14 NOTE — Progress Notes (Signed)
Weight gain due to increasing fluid in her LE's in last 2 days . On steroids; eating insatiably. Has fluid pills at home but has not taken any.

## 2020-08-14 NOTE — Assessment & Plan Note (Addendum)
#   Non-small cell lung cancer/adenocarcinoma- s/p palliative radiation left upper lobe  [jan 18th, 2022]; also-SRS for brain metastases [finished October 2021] FEB 9th, 2022- PET scan- NO DISTANT METASTATIC DISEASE; improved left upper lobe mass SUV.  #Recurrent metastatic disease to the brain-currently on whole brain radiation-until April 13th; dex 109m BID x1 week; and then 260mBID x1 week; 2 mg QOD x1 week; STOP. Will order MRI for 2 months.   # Weight gain/fluid retention-recommend  Lasix 40 mg/day;   # Back pain likely degenerative-worse; MS Contin 15 mg 3 times daily; oxycodone 15 mg for breakthrough pain medication- STABLE.   #UA-positive for E. coli multiple drug resistance/likely colonization; patient is asymptomatic.  Recommend monitoring  # IV access- s/p port placement  #Delirium-multifactorial underlying brain metastases/steroids elevated blood sugars/alteration of sleep cycle. STABLE.  # DISPOSITION: # follow up in 4 weeks-[name] [coordinate with Dr.Chrystal appt] # follow up in 2 months- MD: labs- cbc/cmp; Brain MRI prior [need port accessed for the dye]  Dr.B

## 2020-08-15 ENCOUNTER — Encounter: Payer: Self-pay | Admitting: Adult Health Nurse Practitioner

## 2020-08-15 ENCOUNTER — Other Ambulatory Visit: Payer: Self-pay

## 2020-08-15 ENCOUNTER — Other Ambulatory Visit: Payer: PPO | Admitting: Adult Health Nurse Practitioner

## 2020-08-15 VITALS — BP 108/54 | HR 73

## 2020-08-15 DIAGNOSIS — C3412 Malignant neoplasm of upper lobe, left bronchus or lung: Secondary | ICD-10-CM | POA: Diagnosis not present

## 2020-08-15 DIAGNOSIS — M546 Pain in thoracic spine: Secondary | ICD-10-CM

## 2020-08-15 DIAGNOSIS — C7931 Secondary malignant neoplasm of brain: Secondary | ICD-10-CM | POA: Diagnosis not present

## 2020-08-15 DIAGNOSIS — G8929 Other chronic pain: Secondary | ICD-10-CM

## 2020-08-15 DIAGNOSIS — Z515 Encounter for palliative care: Secondary | ICD-10-CM
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
## 2020-08-19 ENCOUNTER — Telehealth: Payer: Self-pay

## 2020-08-19 ENCOUNTER — Other Ambulatory Visit: Payer: Self-pay | Admitting: *Deleted

## 2020-08-19 MED ORDER — OXYCODONE HCL 20 MG PO TABS: 20.0000 mg | ORAL_TABLET | ORAL | 0 refills | Status: DC | PRN

## 2020-08-19 NOTE — Telephone Encounter (Signed)
Received message to call patient's daughter, Kimberly Huff, regarding new rash on patient. Phone call placed to [name]. VM left with purpose of call and call back information.

## 2020-08-20 ENCOUNTER — Emergency Department: Payer: PPO

## 2020-08-20 ENCOUNTER — Telehealth: Payer: Self-pay | Admitting: *Deleted

## 2020-08-20 ENCOUNTER — Other Ambulatory Visit: Payer: Self-pay

## 2020-08-20 ENCOUNTER — Telehealth: Payer: Self-pay | Admitting: Hospice and Palliative Medicine

## 2020-08-20 ENCOUNTER — Telehealth: Payer: Self-pay | Admitting: Internal Medicine

## 2020-08-20 ENCOUNTER — Emergency Department
Admission: EM | Admit: 2020-08-20 | Discharge: 2020-08-20 | Disposition: A | Discharge status: Home / Self Care | Payer: PPO | Attending: Emergency Medicine | Admitting: Emergency Medicine

## 2020-08-20 DIAGNOSIS — Z79899 Other long term (current) drug therapy: Secondary | ICD-10-CM | POA: Diagnosis not present

## 2020-08-20 DIAGNOSIS — Z85118 Personal history of other malignant neoplasm of bronchus and lung: Secondary | ICD-10-CM | POA: Insufficient documentation

## 2020-08-20 DIAGNOSIS — I10 Essential (primary) hypertension: Secondary | ICD-10-CM | POA: Insufficient documentation

## 2020-08-20 DIAGNOSIS — C719 Malignant neoplasm of brain, unspecified: Secondary | ICD-10-CM | POA: Diagnosis not present

## 2020-08-20 DIAGNOSIS — Z794 Long term (current) use of insulin: Secondary | ICD-10-CM | POA: Insufficient documentation

## 2020-08-20 DIAGNOSIS — W19XXXA Unspecified fall, initial encounter: Secondary | ICD-10-CM | POA: Insufficient documentation

## 2020-08-20 DIAGNOSIS — S9031XA Contusion of right foot, initial encounter: Secondary | ICD-10-CM | POA: Insufficient documentation

## 2020-08-20 DIAGNOSIS — E1129 Type 2 diabetes mellitus with other diabetic kidney complication: Secondary | ICD-10-CM | POA: Insufficient documentation

## 2020-08-20 DIAGNOSIS — R531 Weakness: Secondary | ICD-10-CM | POA: Diagnosis not present

## 2020-08-20 DIAGNOSIS — I2510 Atherosclerotic heart disease of native coronary artery without angina pectoris: Secondary | ICD-10-CM | POA: Diagnosis not present

## 2020-08-20 DIAGNOSIS — S0001XA Abrasion of scalp, initial encounter: Secondary | ICD-10-CM | POA: Diagnosis not present

## 2020-08-20 DIAGNOSIS — M7989 Other specified soft tissue disorders: Secondary | ICD-10-CM | POA: Diagnosis not present

## 2020-08-20 DIAGNOSIS — I959 Hypotension, unspecified: Secondary | ICD-10-CM | POA: Diagnosis not present

## 2020-08-20 DIAGNOSIS — Y92009 Unspecified place in unspecified non-institutional (private) residence as the place of occurrence of the external cause: Secondary | ICD-10-CM | POA: Diagnosis not present

## 2020-08-20 DIAGNOSIS — Z043 Encounter for examination and observation following other accident: Secondary | ICD-10-CM | POA: Diagnosis not present

## 2020-08-20 DIAGNOSIS — Z955 Presence of coronary angioplasty implant and graft: Secondary | ICD-10-CM | POA: Diagnosis not present

## 2020-08-20 DIAGNOSIS — S99921A Unspecified injury of right foot, initial encounter: Secondary | ICD-10-CM | POA: Diagnosis present

## 2020-08-20 DIAGNOSIS — C3412 Malignant neoplasm of upper lobe, left bronchus or lung: Secondary | ICD-10-CM

## 2020-08-20 HISTORY — DX: Malignant (primary) neoplasm, unspecified: C80.1

## 2020-08-20 NOTE — Telephone Encounter (Signed)
I spoke with patient's daughter. Patient was seen today in the ER for a fall. Workup was unrevealing. Head CT showed improvement in brain mets.  However, patient is increasingly weak per daughter.  Daughter feels like patient's quality of life is poor.  Patient is not on active cancer treatment.  We discussed the option for home health versus home hospice.  Daughter does not feel that home health has benefited patient in the past.  She verbalized preference for hospice care at home.  Discussed with Dr. Rogue Bussing.  Order sent for hospice to [facility].   MRI cancelled but daughter would like to keep clinic appointments scheduled for now.

## 2020-08-20 NOTE — ED Triage Notes (Signed)
Patient to ER via [facility] from home for c/o fall this am (second today). Patient has h/o lung CA with mets. Vitals per EMS: 120/91, 97%, 51HR. Patient alert and oriented per EMS with no complaints of pain.

## 2020-08-20 NOTE — ED Triage Notes (Addendum)
Pt comes via EMS from home with c/o fall that happened this am. Pt had a slid type fall. Pt denies any pain or other complaints.  Pt was standing and fell hitting her head witnessed by daughter. Pt denies any LOC. Pt states she had a clumsy fall. Pt not on blood thinners.  Pt has lung CA with mets.

## 2020-08-20 NOTE — Telephone Encounter (Signed)
[facility]- pls cnl mri

## 2020-08-20 NOTE — ED Notes (Signed)
Patient assisted to and from restroom with wheelchair. Daughter updated by Suanne Marker PA

## 2020-08-20 NOTE — Discharge Instructions (Signed)
Follow-up with your primary care provider if any continued problems or concerns.  No fractures or injury intracranially was noted on CT scan.  No fractures to the foot.  Continue with your regular medication as prescribed by your doctor.

## 2020-08-20 NOTE — Progress Notes (Signed)
[facility] CONSULT NOTE  Patient Care Team: [name], MD as PCP - General (Internal Medicine) [name], MD (General Surgery) [name], MD (Internal Medicine) Telford Nab, RN as Oncology Nurse Navigator [name], RPH-CPP (Pharmacist)  CHIEF COMPLAINTS/PURPOSE OF CONSULTATION:  Lung cancer/Brain metastases  #  Oncology History Overview Note  # OCT 2021-left upper lobe lung mass invading mediastinum; bronc/Bx-  Oct 20th [Dr.Aleskerov]; non-small cell carcinoma/adenocarcinoma.  Stage IV [brain metastases]; NGS-QNS; PDL-1- 95%.  #2021-PET scan left upper lobe mass; presacral question benign mass; no distant site disease  #Right frontal/temporal brain met x2 [1-1.2m]-seizure- on dex; Keppra; SBRT s/p Oct 13th, 2021  # 11/18-RT to LUL ca [? hemoptysis] s/p RT [until jan 11th, 2022]  # 2021- incidental-presacral mass-without significant SUV uptake suspect-benign etiology monitor for now.  # DM on OHA; HTN; SOLITARY KIDNEY [frequent infections/ atrophic- s/p nephrectomy 2020]; colonization-multiple drug resistance [s/p ID- Dr.Ravishankar- DEC 2021]  # NGS/MOLECULAR TESTS:NGS-QNS; guardant testing-inconclusive; PDL-1- 95%.     # PALLIATIVE CARE EVALUATION:03/18/2020  # PAIN MANAGEMENT:   DIAGNOSIS: Lung cancer  STAGE:   IV      ;  GOALS: Palliative  CURRENT/MOST RECENT THERAPY : RT    Brain metastasis ([facility]  02/02/2020 Initial Diagnosis   Brain metastasis (HCC)   [place] cancer of [place] upper lobe of lung [place]  02/26/2020 Initial Diagnosis   [place] lobe of lung [place]   05/14/2020 Cancer Staging   Staging form: Lung, AJCC 8th Edition - Clinical: Stage IVB (cT3, pM1c) - Signed by BCammie Sickle MD on 05/14/2020    HISTORY OF PRESENTING ILLNESS:  [name]89y.o.  female  patient with a history of solitary kidney; diabetes; left lung cancer; brain metastasis status post SRS; S/p RT to LUL  cancer is here for follow-up.  In the interim patient had been evaluated and symptomatic management for possible UTI.  Urine culture multiple drug-resistant E. Coli.  Given the absence of any significant symptoms-thought to be colonization.  Patient continues to have intermittent episodes of confusion.  She continues been steroids; tapered by radiation oncology.  Swelling in the legs.  Good appetite/weight gain.  Review of Systems  Constitutional: Positive for fever and malaise/fatigue. Negative for chills, diaphoresis and weight loss.  HENT: Negative for nosebleeds and sore throat.   Eyes: Negative for double vision.  Respiratory: Negative for cough, sputum production and wheezing.   Cardiovascular: Negative for chest pain, palpitations and orthopnea.  Gastrointestinal: Negative for abdominal pain, blood in stool, constipation, diarrhea, heartburn, melena, nausea and vomiting.  Genitourinary: Negative for dysuria, frequency and urgency.  Musculoskeletal: Positive for back pain and joint pain.  Skin: Negative.  Negative for itching and rash.  Neurological: Negative for dizziness, tingling, focal weakness, weakness and headaches.  Endo/Heme/Allergies: Does not bruise/bleed easily.  Psychiatric/Behavioral: Positive for memory loss. Negative for depression. The patient is nervous/anxious and has insomnia.      MEDICAL HISTORY:  Past Medical History:  Diagnosis Date  . Allergy   . Atrophic kidney    left  . Blood clot in vein 45 yrs ago   inabdoment after surgery  . Blood transfusion without reported diagnosis   . Cancer ([place]   . Cataract   . Complication of anesthesia   . Coronary artery disease   . Coronary atherosclerosis   . Diabetes mellitus   . Diverticulosis 2005   Dr SJamal Collin . Dyspnea  with exertion  . External hemorrhoids without mention of complication 9528  . GERD (gastroesophageal reflux disease)   . H/O cardiac catheterization 2005  . Heart murmur 2012   found  by Dr. Jamal Collin  . Hyperlipidemia   . Hypertension   . Kidney stone   . Lesion of bladder   . Myocardial infarction ([place]) 2000  . Neuromuscular disorder ([place])    "achy muscles"  . Obesity   . Osteoarthritis   . PONV (postoperative nausea and vomiting)   . Recurrent UTI   . Seizure ([place])   . Spinal stenosis of cervical region   . Steatohepatitis 05/30/10   Brunt Stage 3, non alcoholic cirrhosis of the liver  . Type II or unspecified type diabetes mellitus without mention of complication, uncontrolled    Patient does takes Metformin and recommended to stop for 48 hours.  Marland Kitchen Ulcer   . Unspecified essential hypertension 2014    SURGICAL HISTORY: Past Surgical History:  Procedure Laterality Date  . ABDOMINAL HYSTERECTOMY  1982   complete  . BREAST CYST ASPIRATION Right 1990's   benign  . BREAST MASS EXCISION     benign  . CARDIAC CATHETERIZATION  2000   cardiac stent  . cardiac stents  2000  . CATARACT EXTRACTION     both eyes  . COLONOSCOPY  2004, 2015   Dr. Jamal Collin, Dr. Olevia Perches  . CYSTOSCOPY W/ RETROGRADES Left 11/12/2015   Procedure: CYSTOSCOPY WITH RETROGRADE PYELOGRAM;  Surgeon: Cleon Gustin, MD;  Location: [facility];  Service: Urology;  Laterality: Left;  . CYSTOSCOPY W/ URETERAL STENT PLACEMENT Left 11/12/2015   Procedure: CYSTOSCOPY WITH STENT REPLACEMENT;  Surgeon: Cleon Gustin, MD;  Location: [facility];  Service: Urology;  Laterality: Left;  . CYSTOSCOPY WITH BIOPSY N/A 09/26/2019   Procedure: CYSTOSCOPY WITH BIOPSY AND FULGURATION;  Surgeon: Cleon Gustin, MD;  Location[facility];  Service: Urology;  Laterality: N/A;  . CYSTOSCOPY WITH URETEROSCOPY AND STENT PLACEMENT Left 10/15/2015   Procedure: CYSTOSCOPY WITH URETEROSCOPY AND STENT PLACEMENT;  Surgeon: Cleon Gustin, MD;  Location: [facility];  Service: Urology;  Laterality: Left;  . EYE SURGERY Bilateral 2012   cataract  . FOOT SURGERY  2008  . IR IMAGING GUIDED PORT INSERTION   05/23/2020  . PTCA  2000   stent x 1  . ROBOTIC ASSITED PARTIAL NEPHRECTOMY Left 06/02/2019   Procedure: XI ROBOTIC ASSITED NEPHRECTOMY;  Surgeon: Cleon Gustin, MD;  Location: [facility];  Service: Urology;  Laterality: Left;  . SALPINGOOPHORECTOMY    . TONSILLECTOMY    . UPPER GI ENDOSCOPY    . URETEROSCOPY WITH HOLMIUM LASER LITHOTRIPSY Left 11/12/2015   Procedure: URETEROSCOPY WITH HOLMIUM LASER LITHOTRIPSY;  Surgeon: Cleon Gustin, MD;  Location: [facility];  Service: Urology;  Laterality: Left;  . URETEROTOMY  1960/1969   x 2 during childhood  . VAGINAL HYSTERECTOMY    . VIDEO BRONCHOSCOPY WITH ENDOBRONCHIAL NAVIGATION N/A 02/21/2020   Procedure: VIDEO BRONCHOSCOPY WITH ENDOBRONCHIAL NAVIGATION;  Surgeon: Ottie Glazier, MD;  Location: [facility];  Service: Thoracic;  Laterality: N/A;  . VIDEO BRONCHOSCOPY WITH ENDOBRONCHIAL ULTRASOUND N/A 02/21/2020   Procedure: VIDEO BRONCHOSCOPY WITH ENDOBRONCHIAL ULTRASOUND;  Surgeon: Ottie Glazier, MD;  Location: [facility];  Service: Thoracic;  Laterality: N/A;    SOCIAL HISTORY: Social History   Socioeconomic History  . Marital status: Single    Spouse name: Not on file  . Number of children: 2  . Years of education: Not  on file  . Highest education level: Not on file  Occupational History  . 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 Smoking status: Former Smoker    Packs/day: 0.50    Years: 38.00    Pack years: 19.00    Types: Cigarettes    Quit date: 09/15/1998    Years since quitting: 21.9  . Smokeless tobacco: Never Used  Vaping Use  . Vaping Use: Never used  Substance and Sexual Activity  . Alcohol use: Not Currently  . Drug use: No  . Sexual activity: Not Currently  Other Topics Concern  . Not on file  Social History Narrative   Lives in [place]; daughter lives in [place]. Quit smoking may 14th 2000. No alcohol. Retired  From [employer] job.    Social Determinants  of Health   Financial Resource Strain: Low Risk   . Difficulty of Paying Living Expenses: Not hard at all  Food Insecurity: No Food Insecurity  . Worried About Charity fundraiser in the Last Year: Never true  . Ran Out of Food in the Last Year: Never true  Transportation Needs: No Transportation Needs  . Lack of Transportation (Medical): No  . Lack of Transportation (Non-Medical): No  Physical Activity: Not on file  Stress: No Stress Concern Present  . Feeling of Stress : Only a little  Social Connections: Unknown  . Frequency of Communication with Friends and Family: More than three times a week  . Frequency of Social Gatherings with Friends and Family: More than three times a week  . Attends Religious Services: More than 4 times per year  . Active Member of Clubs or Organizations: Yes  . Attends Archivist Meetings: More than 4 times per year  . Marital Status: Not on file  Intimate Partner Violence: Not At Risk  . Fear of Current or Ex-Partner: No  . Emotionally Abused: No  . Physically Abused: No  . Sexually Abused: No    FAMILY HISTORY: Family History  Problem Relation Age of Onset  . Lung cancer Father   . Heart disease Father   . Diverticulosis Mother   . Diabetes Mother   . Stroke Mother 80  . Autoimmune disease Daughter   . Thyroid disease Daughter   . Thyroid disease Sister   . Stomach cancer Maternal Aunt   . Breast cancer Maternal Aunt   . Colon cancer Neg Hx   . Esophageal cancer Neg Hx   . Rectal cancer Neg Hx     ALLERGIES:  has No Known Allergies.  MEDICATIONS:  No current facility-administered medications for this visit.   Current Outpatient Medications  Medication Sig Dispense Refill  . acetaminophen (TYLENOL) 500 MG tablet Take 500 mg by mouth every 6 (six) hours as needed.    . Continuous Blood Gluc Receiver (FREESTYLE LIBRE 14 DAY READER) DEVI Use to check BS 4 times daily 2 each 2  . Continuous Blood Gluc Sensor (FREESTYLE LIBRE  14 DAY SENSOR) MISC Use to check BS 4 times daily 6 each 3  . denosumab (PROLIA) 60 MG/ML SOSY injection Inject into the skin.    Marland Kitchen dexamethasone (DECADRON) 2 MG tablet Take 1 tablet (2 mg total) by mouth 2 (two) times daily with a meal. 30 tablet 0  . insulin glargine (LANTUS SOLOSTAR) 100 UNIT/ML Solostar Pen Inject 17 Units into the skin daily. 15 mL 3  . Insulin Pen Needle 32G X 6 MM  MISC Use to inject insulin daily 100 each 3  . levETIRAcetam (KEPPRA) 500 MG tablet Take 1 tablet (500 mg total) by mouth 2 (two) times daily. 60 tablet 2  . lidocaine-prilocaine (EMLA) cream Apply 1 application topically as needed. 30-45 mins prior to port access. 30 g 0  . losartan (COZAAR) 50 MG tablet Take 1 tablet (50 mg total) by mouth daily. 90 tablet 3  . morphine (MS CONTIN) 30 MG 12 hr tablet Take 1 tablet (30 mg total) by mouth every 8 (eight) hours. 90 tablet 0  . naloxone (NARCAN) nasal spray 4 mg/0.1 mL 1 spray into nostril upon signs of opioid overdose. Call 911. May repeat once if no response within 2-3 minutes. 1 each 0  . omeprazole (PRILOSEC) 40 MG capsule TAKE 1 CAPSULE BY MOUTH ONCE DAILY 90 capsule 1  . ondansetron (ZOFRAN) 8 MG tablet One pill every 8 hours as needed for nausea/vomitting. 90 tablet 1  . ONETOUCH ULTRA test strip USE AS INSTRUCTED 100 each 12  . prochlorperazine (COMPAZINE) 10 MG tablet Take 1 tablet (10 mg total) by mouth every 6 (six) hours as needed for nausea or vomiting. 40 tablet 1  . rosuvastatin (CRESTOR) 10 MG tablet TAKE ONE TABLET EVERY DAY 90 tablet 1  . sucralfate (CARAFATE) 1 g tablet Take 0.5 tablets (0.5 g total) by mouth 3 (three) times daily. Dissolve in 3-4 tbsp warm water, swallow. 60 tablet 1  . triamcinolone ointment (KENALOG) 0.5 % Apply 1 application topically 2 (two) times daily. 30 g 0  . furosemide (LASIX) 40 MG tablet TAKE ONE TABLET EVERY DAY (Patient not taking: No sig reported) 90 tablet 1  . OLANZapine (ZYPREXA) 10 MG tablet Take 1 tablet (10 mg  total) by mouth at bedtime as needed (nausea/sleep). 30 tablet 2  . Oxycodone HCl 20 MG TABS Take 1-2 tablets (20-40 mg total) by mouth every 4 (four) hours as needed (breakthrough pain). 90 tablet 0   Facility-Administered Medications Ordered in Other Visits  Medication Dose Route Frequency Provider Last Rate Last Admin  . heparin lock flush 100 unit/mL  500 Units Intravenous Once Charlaine Dalton R, MD      . sodium chloride flush (NS) 0.9 % injection 10 mL  10 mL Intravenous Once Charlaine Dalton R, MD          .  PHYSICAL EXAMINATION: ECOG PERFORMANCE STATUS: 1 - Symptomatic but completely ambulatory  Vitals:   08/14/20 0936  BP: 119/63  Pulse: 69  Resp: 18  Temp: (!) 97.5 F (36.4 C)  SpO2: 99%   Filed Weights   08/14/20 0931  Weight: 189 lb 1.6 oz (85.8 kg)    Physical Exam Vitals and nursing note reviewed.  Constitutional:      Comments: Patient came by daughter.  She is walking independently.  HENT:     Head: Normocephalic and atraumatic.     Mouth/Throat:     Pharynx: No oropharyngeal exudate.  Eyes:     Pupils: Pupils are equal, round, and reactive to light.  Cardiovascular:     Rate and Rhythm: Normal rate and regular rhythm.  Pulmonary:     Effort: No respiratory distress.     Breath sounds: No wheezing.     Comments: Decreased air entry bilaterally left more than right. Abdominal:     General: Bowel sounds are normal. There is no distension.     Palpations: Abdomen is soft. There is no mass.     Tenderness: There is  no abdominal tenderness. There is no guarding or rebound.  Musculoskeletal:        General: No tenderness. Normal range of motion.     Cervical back: Normal range of motion and neck supple.  Skin:    General: Skin is warm.  Neurological:     Mental Status: She is alert and oriented to person, place, and time.  Psychiatric:        Mood and Affect: Affect normal.      LABORATORY DATA:  I have reviewed the data as listed Lab  Results  Component Value Date   WBC 6.9 08/14/2020   HGB 11.9 (L) 08/14/2020   HCT 39.2 08/14/2020   MCV 85.4 08/14/2020   PLT 93 (L) 08/14/2020   Recent Labs    02/02/20 0925 02/03/20 0541 02/09/20 1002 07/22/20 1320 08/06/20 1039 08/14/20 0913  NA 137 133*   < > 134* 134* 140  K 4.2 4.3   < > 4.4 4.6 4.6  CL 100 99   < > 103 102 106  CO2 27 24   < > 21* 22 26  GLUCOSE 177* 248*   < > 137* 142* 147*  BUN 16 18   < > 12 32* 25*  CREATININE 0.81 0.78   < > 0.78 0.75 0.77  CALCIUM 9.6 9.2   < > 8.6* 7.9* 8.1*  GFRNONAA >60 >60   < > >60 >60 >60  GFRAA >60 >60  --   --   --   --   PROT 7.7  --    < > 6.9 5.8* 5.4*  ALBUMIN 4.5  --    < > 3.7 3.1* 3.1*  AST 29  --    < > 14* 15 18  ALT 31  --    < > 11 26 28   ALKPHOS 43  --    < > 75 40 36*  BILITOT 0.9  --    < > 0.9 0.6 0.7   < > = values in this interval not displayed.    RADIOGRAPHIC STUDIES: I have personally reviewed the radiological images as listed and agreed with the findings in the report. MR Brain W Wo Contrast  Result Date: 07/23/2020 CLINICAL DATA:  Metastatic lung cancer, worsening headaches, confusion EXAM: MRI HEAD WITHOUT AND WITH CONTRAST TECHNIQUE: Multiplanar, multiecho pulse sequences of the brain and surrounding structures were obtained without and with intravenous contrast. CONTRAST:  7.3m GADAVIST GADOBUTROL 1 MMOL/ML IV SOLN COMPARISON:  05/01/2020 noncontrast study, 02/05/2020 contrast study FINDINGS: Brain: Several presumed new lesions are identified on series 18, noting prior study was performed without contrast: Right frontal lobe (image 96) 1.7 x 1.5 cm with adjacent leptomeningeal enhancement; Right inferior parasagittal parietal lobe (image 91) 1.4 x 1.1 cm; Left temporal lobe (image 62) 0.7 cm; Right occipital lobe (image 57) 0.7 cm; Posterior right cerebellum (image 41) 1.7 x 2 cm; Lateral right cerebellum (image 33) 1.4 x 1.1 cm; Left cerebellar tonsil (image 20) 0.8 cm Previously seen right  lateral frontal lesion has a slightly different configuration but measures similar in size 1.3 x 0.8 cm. Previously seen posterior right temporal lesion measures 0.4 cm (previously 1 cm). There is edema associated with all the above. Edema associated with the prior lesions has increased. Mild effacement right lateral ventricle. No significant midline shift. There is no acute infarction or intracranial hemorrhage. There is no hydrocephalus or extra-axial fluid collection. Vascular: Major vessel flow voids at the skull base are preserved. Right  periophthalmic aneurysm is not well evaluated. Skull and upper cervical spine: Normal marrow signal is preserved. Sinuses/Orbits: Paranasal sinuses are aerated. Orbits are unremarkable. Other: Sella is unremarkable.  Mastoid air cells are clear. IMPRESSION: Approximately 7 new metastatic lesions and associated edema. No significant mass effect. A right frontal lesion has adjacent leptomeningeal enhancement. CSF sampling is recommended. Similar size of prior right frontal lesion. Decrease in size of prior right temporal lesion. Increased edema associated with both. Electronically Signed   By: Macy Mis M.D.   On: 07/23/2020 12:52   DG ESOPHAGUS W SINGLE CM (SOL OR THIN BA)  Result Date: 07/23/2020 CLINICAL DATA:  Dysphagia EXAM: ESOPHOGRAM / BARIUM SWALLOW / BARIUM TABLET STUDY TECHNIQUE: Combined double contrast and single contrast examination performed using effervescent crystals, thick barium liquid, and thin barium liquid. The patient was observed with fluoroscopy swallowing a 13 mm barium sulphate tablet. FLUOROSCOPY TIME:  Fluoroscopy Time:  54 seconds Radiation Exposure Index (if provided by the fluoroscopic device): 5.5 mGy Number of Acquired Spot Images: 0 COMPARISON:  None. FINDINGS: Premature spillage. Flash laryngeal penetration without tracheal aspiration. Contrast flowed freely through the esophagus without evidence of a stricture or mass. Normal  esophageal mucosa without evidence of irregularity or ulceration. Severe tertiary contractions of the distal half of the esophagus throughout the examination as can be seen with esophageal spasm versus presbyesophagus. Mild gastroesophageal reflux. No definite hiatal hernia was demonstrated. At the end of the examination a 13 mm barium tablet was administered which transited through the esophagus and esophagogastric junction without delay. IMPRESSION: Severe tertiary contractions of the distal half of the esophagus throughout the examination as can be seen with esophageal spasm versus presbyesophagus. Mild gastroesophageal reflux. Electronically Signed   By: Kathreen Devoid   On: 07/23/2020 14:34    ASSESSMENT & PLAN:   Primary cancer of left upper lobe of lung ([place]) # Non-small cell lung cancer/adenocarcinoma- s/p palliative radiation left upper lobe  [jan 18th, 2022]; also-SRS for brain metastases [finished October 2021] FEB 9th, 2022- PET scan- NO DISTANT METASTATIC DISEASE; improved left upper lobe mass SUV.  #Recurrent metastatic disease to the brain-currently on whole brain radiation-until April 13th; dex 76m BID x1 week; and then 286mBID x1 week; 2 mg QOD x1 week; STOP. Will order MRI for 2 months.   # Weight gain/fluid retention-recommend  Lasix 40 mg/day;   # Back pain likely degenerative-worse; MS Contin 15 mg 3 times daily; oxycodone 15 mg for breakthrough pain medication- STABLE.   #UA-positive for E. coli multiple drug resistance/likely colonization; patient is asymptomatic.  Recommend monitoring  # IV access- s/p port placement  #Delirium-multifactorial underlying brain metastases/steroids elevated blood sugars/alteration of sleep cycle. STABLE.  # DISPOSITION: # follow up in 4 weeks-[name] [coordinate with Dr.Chrystal appt] # follow up in 2 months- MD: labs- cbc/cmp; Brain MRI prior [need port accessed for the dye]  Dr.B  All questions were answered. The patient knows to call the  clinic with any problems, questions or concerns.    GoCammie SickleMD 08/20/2020 11:56 AM

## 2020-08-20 NOTE — Telephone Encounter (Signed)
Kimberly Huff called reporting that patient has fallen for the past 4 days and today was significantly worse and she has been taken to ER for evaluation. She is requesting a return call from Amado Nash, NP

## 2020-08-20 NOTE — Telephone Encounter (Signed)
On 4/19-I spoke to patient's daughter, [name] regarding concerns for decline overall likely from steroids/radiation even the CT scan brain showed improved metastases.  Overall patient is a poor prognosis.  Recommend hospice.  Discussed with [facility].

## 2020-08-20 NOTE — ED Notes (Signed)
Unable to locate patient black pants and ortho shoes, daughter aware. States she will call back to see if they were found later if needed.

## 2020-08-20 NOTE — ED Provider Notes (Signed)
[facility] Emergency Department Provider Note  ____________________________________________   Event Date/Time   First MD Initiated Contact with Patient 08/20/20 1126     (approximate)  I have reviewed the triage vital signs and the nursing notes.   HISTORY  Chief Complaint Fall   HPI Kimberly Huff is a 75 y.o. female presents to the ED with daughter after falling at home this morning.  Fall was witnessed by daughter and she denies any LOC.  Patient has history of cancer with metastasis, brain mass, pelvic mass currently being treated by the cancer center.  Patient denies any visual changes, nausea, vomiting and states that she feels fine at this point.  Daughter also points out that she has a bruise to her right foot is unaware of any injury to her foot.  Currently she rates her pain as 0/10.       Past Medical History:  Diagnosis Date  . Allergy   . Atrophic kidney    left  . Blood clot in vein 45 yrs ago   inabdoment after surgery  . Blood transfusion without reported diagnosis   . Cancer ([place])   . Cataract   . Complication of anesthesia   . Coronary artery disease   . Coronary atherosclerosis   . Diabetes mellitus   . Diverticulosis 2005   Dr Jamal Collin  . Dyspnea    with exertion  . External hemorrhoids without mention of complication 8416  . GERD (gastroesophageal reflux disease)   . H/O cardiac catheterization 2005  . Heart murmur 2012   found by Dr. Jamal Collin  . Hyperlipidemia   . Hypertension   . Kidney stone   . Lesion of bladder   . Myocardial infarction ([place]) 2000  . Neuromuscular disorder ([place])    "achy muscles"  . Obesity   . Osteoarthritis   . PONV (postoperative nausea and vomiting)   . Recurrent UTI   . Seizure ([place])   . Spinal stenosis of cervical region   . Steatohepatitis 05/30/10   Brunt Stage 3, non alcoholic cirrhosis of the liver  . Type II or unspecified type diabetes mellitus without mention of complication,  uncontrolled    Patient does takes Metformin and recommended to stop for 48 hours.  Marland Kitchen Ulcer   . Unspecified essential hypertension 2014    Patient Active Problem List   Diagnosis Date Noted  . Aortic arch atherosclerosis ([place]) 03/19/2020  . Aortic aneurysm ([place]) 03/19/2020  . Primary cancer of left upper lobe of lung ([place]) 02/26/2020  . Aneurysm of cavernous portion of right internal carotid artery 02/03/2020  . Brain mass 02/02/2020  . Brain metastasis ([place]) 02/02/2020  . Obesity (BMI 30.0-34.9)   . Back pain, chronic 01/22/2020  . OAB (overactive bladder) 12/13/2019  . Chronic cystitis 10/08/2019  . Pelvic mass in female 03/28/2019  . Thoracic spondylosis without myelopathy 03/28/2019  . Osteoporosis 11/12/2017  . Urge incontinence of urine 02/25/2016  . Hospital discharge follow-up 10/26/2015  . Benign essential HTN 10/25/2015  . Atrophic kidney 10/25/2015  . History of myocardial infarction 10/25/2015  . Microalbuminuria due to type 2 diabetes mellitus ([place]) 06/29/2015  . Personal history of gastric ulcer 02/23/2013  . Nontraumatic shoulder pain, right 02/23/2013  . Vitamin D deficiency 02/23/2013  . History of right humeral fracture  02/16/2012  . Gouty arthritis 08/25/2011  . Type 2 diabetes mellitus, controlled, with renal complications ([place]) 60/63/0160  . Hemorrhoid 05/27/2011  . Dyslipidemia associated with type 2 diabetes  mellitus ([place]) 02/16/2011  . Cataract extraction status 02/16/2011  . Nonfunctioning left kidney 02/16/2011  . Cirrhosis, non-alcoholic ([place]) 69/67/8938    Past Surgical History:  Procedure Laterality Date  . ABDOMINAL HYSTERECTOMY  1982   complete  . BREAST CYST ASPIRATION Right 1990's   benign  . BREAST MASS EXCISION     benign  . CARDIAC CATHETERIZATION  2000   cardiac stent  . cardiac stents  2000  . CATARACT EXTRACTION     both eyes  . COLONOSCOPY  2004, 2015   Dr. Jamal Collin, Dr. Olevia Perches  . CYSTOSCOPY W/ RETROGRADES Left 11/12/2015    Procedure: CYSTOSCOPY WITH RETROGRADE PYELOGRAM;  Surgeon: Cleon Gustin, MD;  Location: [facility];  Service: Urology;  Laterality: Left;  . CYSTOSCOPY W/ URETERAL STENT PLACEMENT Left 11/12/2015   Procedure: CYSTOSCOPY WITH STENT REPLACEMENT;  Surgeon: Cleon Gustin, MD;  Location: [facility];  Service: Urology;  Laterality: Left;  . CYSTOSCOPY WITH BIOPSY N/A 09/26/2019   Procedure: CYSTOSCOPY WITH BIOPSY AND FULGURATION;  Surgeon: Cleon Gustin, MD;  Location: [facility];  Service: Urology;  Laterality: N/A;  . CYSTOSCOPY WITH URETEROSCOPY AND STENT PLACEMENT Left 10/15/2015   Procedure: CYSTOSCOPY WITH URETEROSCOPY AND STENT PLACEMENT;  Surgeon: Cleon Gustin, MD;  Location: [facility];  Service: Urology;  Laterality: Left;  . EYE SURGERY Bilateral 2012   cataract  . FOOT SURGERY  2008  . IR IMAGING GUIDED PORT INSERTION  05/23/2020  . PTCA  2000   stent x 1  . ROBOTIC ASSITED PARTIAL NEPHRECTOMY Left 06/02/2019   Procedure: XI ROBOTIC ASSITED NEPHRECTOMY;  Surgeon: Cleon Gustin, MD;  Location: [facility];  Service: Urology;  Laterality: Left;  . SALPINGOOPHORECTOMY    . TONSILLECTOMY    . UPPER GI ENDOSCOPY    . URETEROSCOPY WITH HOLMIUM LASER LITHOTRIPSY Left 11/12/2015   Procedure: URETEROSCOPY WITH HOLMIUM LASER LITHOTRIPSY;  Surgeon: Cleon Gustin, MD;  Location: [facility];  Service: Urology;  Laterality: Left;  . URETEROTOMY  1960/1969   x 2 during childhood  . VAGINAL HYSTERECTOMY    . VIDEO BRONCHOSCOPY WITH ENDOBRONCHIAL NAVIGATION N/A 02/21/2020   Procedure: VIDEO BRONCHOSCOPY WITH ENDOBRONCHIAL NAVIGATION;  Surgeon: Ottie Glazier, MD;  Location: [facility];  Service: Thoracic;  Laterality: N/A;  . VIDEO BRONCHOSCOPY WITH ENDOBRONCHIAL ULTRASOUND N/A 02/21/2020   Procedure: VIDEO BRONCHOSCOPY WITH ENDOBRONCHIAL ULTRASOUND;  Surgeon: Ottie Glazier, MD;  Location: [facility];  Service: Thoracic;  Laterality: N/A;    Prior to Admission  medications   Medication Sig Start Date End Date Taking? Authorizing Provider  acetaminophen (TYLENOL) 500 MG tablet Take 500 mg by mouth every 6 (six) hours as needed.    [provider]  Continuous Blood Gluc Receiver (FREESTYLE LIBRE 14 DAY READER) DEVI Use to check BS 4 times daily 03/19/20   [name], MD  Continuous Blood Gluc Sensor (FREESTYLE LIBRE 14 DAY SENSOR) MISC Use to check BS 4 times daily 06/06/20   [name], MD  denosumab (PROLIA) 60 MG/ML SOSY injection Inject into the skin.    [provider]  dexamethasone (DECADRON) 2 MG tablet Take 1 tablet (2 mg total) by mouth 2 (two) times daily with a meal. 08/13/20   Chrystal, Eulas Post, MD  furosemide (LASIX) 40 MG tablet TAKE ONE TABLET EVERY DAY Patient not taking: No sig reported 04/12/20   [name], MD  insulin glargine (LANTUS SOLOSTAR) 100 UNIT/ML Solostar Pen Inject 17 Units into the skin  daily. 08/07/20   [name], MD  Insulin Pen Needle 32G X 6 MM MISC Use to inject insulin daily 08/07/20   [name], MD  levETIRAcetam (KEPPRA) 500 MG tablet Take 1 tablet (500 mg total) by mouth 2 (two) times daily. 06/26/20   Borders, [name], NP  lidocaine-prilocaine (EMLA) cream Apply 1 application topically as needed. 30-45 mins prior to port access. 05/14/20   [name], MD  losartan (COZAAR) 50 MG tablet Take 1 tablet (50 mg total) by mouth daily. 04/22/20   [name], MD  morphine (MS CONTIN) 30 MG 12 hr tablet Take 1 tablet (30 mg total) by mouth every 8 (eight) hours. 08/05/20   Borders, [name], NP  naloxone [facility]) nasal spray 4 mg/0.1 mL 1 spray into nostril upon signs of opioid overdose. Call 911. May repeat once if no response within 2-3 minutes. 03/25/20   Borders, [name], NP  OLANZapine (ZYPREXA) 10 MG tablet Take 1 tablet (10 mg total) by mouth at bedtime as needed (nausea/sleep). 08/14/20   Borders, [name], NP  omeprazole (PRILOSEC) 40 MG capsule TAKE 1 CAPSULE  BY MOUTH ONCE DAILY 07/31/20   [name], MD  ondansetron [facility]) 8 MG tablet One pill every 8 hours as needed for nausea/vomitting. 06/26/20   Borders, [name], NP  Telecare [facility] ULTRA test strip USE AS INSTRUCTED 02/13/20   [name], MD  Oxycodone HCl 20 MG TABS Take 1-2 tablets (20-40 mg total) by mouth every 4 (four) hours as needed (breakthrough pain). 08/19/20   Borders, [name], NP  prochlorperazine (COMPAZINE) 10 MG tablet Take 1 tablet (10 mg total) by mouth every 6 (six) hours as needed for nausea or vomiting. 03/18/20   [name], MD  rosuvastatin (CRESTOR) 10 MG tablet TAKE ONE TABLET EVERY DAY 04/12/20   [name], MD  sucralfate (CARAFATE) 1 g tablet Take 0.5 tablets (0.5 g total) by mouth 3 (three) times daily. Dissolve in 3-4 tbsp warm water, swallow. 03/26/20   [name], MD  triamcinolone ointment (KENALOG) 0.5 % Apply 1 application topically 2 (two) times daily. 05/14/20   Borders, [name], NP    Allergies Patient has no known allergies.  Family History  Problem Relation Age of Onset  . Lung cancer Father   . Heart disease Father   . Diverticulosis Mother   . Diabetes Mother   . Stroke Mother 42  . Autoimmune disease Daughter   . Thyroid disease Daughter   . Thyroid disease Sister   . Stomach cancer Maternal Aunt   . Breast cancer Maternal Aunt   . Colon cancer Neg Hx   . Esophageal cancer Neg Hx   . Rectal cancer Neg Hx     Social History Social History   Tobacco Use  . Smoking status: Former Smoker    Packs/day: 0.50    Years: 38.00    Pack years: 19.00    Types: Cigarettes    Quit date: 09/15/1998    Years since quitting: 21.9  . Smokeless tobacco: Never Used  Vaping Use  . Vaping Use: Never used  Substance Use Topics  . Alcohol use: Not Currently  . Drug use: No    Review of Systems Constitutional: No fever/chills Eyes: No visual changes.  Negative for dizziness. ENT: No trauma. Cardiovascular: Denies chest  pain. Respiratory: Denies shortness of breath. Gastrointestinal: No abdominal pain.  No nausea, no vomiting.  No diarrhea. Genitourinary: Negative for  dysuria. Musculoskeletal: Negative for back pain.  Negative for right foot pain. Skin: Ecchymosis to the right foot. Neurological: Negative for headaches, focal weakness or numbness.  ____________________________________________   PHYSICAL EXAM:  VITAL SIGNS: ED Triage Vitals  Enc Vitals Group     BP 08/20/20 1035 (!) 114/47     Pulse Rate 08/20/20 1035 (!) 50     Resp 08/20/20 1035 18     Temp 08/20/20 1035 98.1 F (36.7 C)     Temp Source 08/20/20 1035 Oral     SpO2 08/20/20 1035 93 %     Weight 08/20/20 1037 172 lb (78 kg)     Height 08/20/20 1037 5' 3"  (1.6 m)     Head Circumference --      Peak Flow --      Pain Score 08/20/20 1037 0     Pain Loc --      Pain Edu? --      Excl. in [place]? --     Constitutional: Alert and oriented. Well appearing and in no acute distress.  Patient is able to speak in complete sentences and answers questions appropriately. Eyes: Conjunctivae are normal.  Head: Atraumatic. Nose: No trauma noted. Mouth/Throat: No trauma noted. Neck: No stridor.  No tenderness on palpation of cervical spine posteriorly. Cardiovascular: Normal rate, regular rhythm. Grossly normal heart sounds.  Good peripheral circulation. Respiratory: Normal respiratory effort.  No retractions. Lungs CTAB.  No tenderness is noted on palpation of the ribs bilaterally. Gastrointestinal: Soft and nontender. No distention. No abdominal bruits. No CVA tenderness. Musculoskeletal: Patient is able to move upper extremities without any difficulty and on examination of the lower extremities there is ecchymotic area on the dorsal aspect of the right foot without deformity.  Patient denies any tenderness on palpation.  There is some mild dorsal edema present bilateral feet which appears to be chronic.  Nonpitting.  No tenderness is noted on  palpation of the thoracic or lumbar spine. Neurologic:  Normal speech and language. No gross focal neurologic deficits are appreciated. No gait instability. Skin:  Skin is warm, dry.Very superficial abrasion is noted noted posterior scalp.  No active bleeding and area is barely visible.  Patient denies any pain in this area.  Also ecchymosis to the right foot as noted above. Psychiatric: Mood and affect are normal. Speech and behavior are normal.  ____________________________________________   LABS (all labs ordered are listed, but only abnormal results are displayed)  Labs Reviewed - No data to display ____________________________________________   RADIOLOGY I, Johnn Hai, personally viewed and evaluated these images (plain radiographs) as part of my medical decision making, as well as reviewing the written report by the radiologist.   Official radiology report(s): CT Head Wo Contrast  Result Date: 08/20/2020 CLINICAL DATA:  Fall, metastatic lung cancer EXAM: CT HEAD WITHOUT CONTRAST CT CERVICAL SPINE WITHOUT CONTRAST TECHNIQUE: Multidetector CT imaging of the head and cervical spine was performed following the standard protocol without intravenous contrast. Multiplanar CT image reconstructions of the cervical spine were also generated. COMPARISON:  MRI brain dated 07/21/2020.  PET-CT dated 06/11/2021. FINDINGS: CT HEAD FINDINGS Motion degraded images. Brain: No evidence of acute infarction, hemorrhage, hydrocephalus, extra-axial collection or mass effect. Improving metastases predominantly in the right cerebral hemisphere (series 2/images 10, 11, 13, 15, and 17) as well as the right cerebellum (series 2/image 10). Known prior left temporal and cerebellar metastases are not currently evident on CT. Subcortical white matter and periventricular small vessel ischemic changes.  Vascular: Mild intracranial atherosclerosis. Skull: Normal. Negative for fracture or focal lesion. Sinuses/Orbits:  The visualized paranasal sinuses are essentially clear. The mastoid air cells are unopacified. Other: None. CT CERVICAL SPINE FINDINGS Alignment: Straightening of the cervical spine, likely positional. Skull base and vertebrae: No acute fracture. No primary bone lesion or focal pathologic process. Soft tissues and spinal canal: No prevertebral fluid or swelling. No visible canal hematoma. Disc levels: Mild degenerative changes of the mid cervical spine. Spinal canal is patent. Upper chest: Left apical mass (series 3/image 32), incompletely visualized, corresponding to the patient's known primary bronchogenic neoplasm. Other: Visualized thyroid is unremarkable. Right chest port, incompletely visualized. IMPRESSION: Improving CNS metastases, better evaluated on prior MRI. No evidence of acute intracranial abnormality. No evidence of traumatic injury to the cervical spine. Mild degenerative changes of the mid cervical spine. Left apical pulmonary mass, corresponding to the patient's known primary bronchogenic neoplasm, incompletely visualized. Electronically Signed   By: Julian Hy M.D.   On: 08/20/2020 11:54   CT Cervical Spine Wo Contrast  Result Date: 08/20/2020 CLINICAL DATA:  Fall, metastatic lung cancer EXAM: CT HEAD WITHOUT CONTRAST CT CERVICAL SPINE WITHOUT CONTRAST TECHNIQUE: Multidetector CT imaging of the head and cervical spine was performed following the standard protocol without intravenous contrast. Multiplanar CT image reconstructions of the cervical spine were also generated. COMPARISON:  MRI brain dated 07/21/2020.  PET-CT dated 06/11/2021. FINDINGS: CT HEAD FINDINGS Motion degraded images. Brain: No evidence of acute infarction, hemorrhage, hydrocephalus, extra-axial collection or mass effect. Improving metastases predominantly in the right cerebral hemisphere (series 2/images 10, 11, 13, 15, and 17) as well as the right cerebellum (series 2/image 10). Known prior left temporal and  cerebellar metastases are not currently evident on CT. Subcortical white matter and periventricular small vessel ischemic changes. Vascular: Mild intracranial atherosclerosis. Skull: Normal. Negative for fracture or focal lesion. Sinuses/Orbits: The visualized paranasal sinuses are essentially clear. The mastoid air cells are unopacified. Other: None. CT CERVICAL SPINE FINDINGS Alignment: Straightening of the cervical spine, likely positional. Skull base and vertebrae: No acute fracture. No primary bone lesion or focal pathologic process. Soft tissues and spinal canal: No prevertebral fluid or swelling. No visible canal hematoma. Disc levels: Mild degenerative changes of the mid cervical spine. Spinal canal is patent. Upper chest: Left apical mass (series 3/image 32), incompletely visualized, corresponding to the patient's known primary bronchogenic neoplasm. Other: Visualized thyroid is unremarkable. Right chest port, incompletely visualized. IMPRESSION: Improving CNS metastases, better evaluated on prior MRI. No evidence of acute intracranial abnormality. No evidence of traumatic injury to the cervical spine. Mild degenerative changes of the mid cervical spine. Left apical pulmonary mass, corresponding to the patient's known primary bronchogenic neoplasm, incompletely visualized. Electronically Signed   By: Julian Hy M.D.   On: 08/20/2020 11:54   DG Foot Complete Right  Result Date: 08/20/2020 CLINICAL DATA:  Right foot pain and bruising after fall EXAM: RIGHT FOOT COMPLETE - 3+ VIEW COMPARISON:  03/13/2020 FINDINGS: There is no evidence of fracture or dislocation. Moderate hallux valgus with first MTP joint osteoarthritis. Soft tissue swelling most pronounced at the dorsum of the forefoot. IMPRESSION: No acute fracture or dislocation of the right foot. Electronically Signed   By: Davina Poke D.O.   On: 08/20/2020 12:03     ____________________________________________   PROCEDURES  Procedure(s) performed (including Critical Care):  Procedures   ____________________________________________   INITIAL IMPRESSION / ASSESSMENT AND PLAN / ED COURSE  As part of my medical decision making, I  reviewed the following data within the electronic MEDICAL RECORD NUMBER Notes from prior ED visits and [place] Controlled Substance Database  75 year old female was brought to the ED by her daughter with concerns after she fell inside the home this morning.  Fall was witnessed by the daughter and she states that there was no LOC.  She is concerned as patient has cancer with metastasis to multiple areas.  Patient also has an ecchymotic area to her right foot in which she is not aware of any specific injury.  X-rays of the right foot were reassuring as well as a CT of the her head and cervical spine which did not show any acute changes.  Patient and daughter were reassured.  Patient currently already has pain medication at home.  She is also being followed by her PCP and being evaluated by hospice for future care.  Daughter is aware that she can return with her mother if any sudden changes or urgent concerns. ____________________________________________   FINAL CLINICAL IMPRESSION(S) / ED DIAGNOSES  Final diagnoses:  Abrasion of scalp, initial encounter  Contusion of right foot, initial encounter  Fall in home, initial encounter     ED Discharge Orders    None      *Please note:  Kimberly Huff was evaluated in Emergency Department on 08/20/2020 for the symptoms described in the history of present illness. She was evaluated in the context of the global COVID-19 pandemic, which necessitated consideration that the patient might be at risk for infection with the SARS-CoV-2 virus that causes COVID-19. Institutional protocols and algorithms that pertain to the evaluation of patients at risk for COVID-19 are in a state of rapid change  based on information released by regulatory bodies including the CDC and federal and state organizations. These policies and algorithms were followed during the patient's care in the ED.  Some ED evaluations and interventions may be delayed as a result of limited staffing during and the pandemic.*   Note:  This document was prepared using Dragon voice recognition software and may include unintentional dictation errors.    Johnn Hai, PA-C 08/20/20 1426    Harvest Dark, MD 08/20/20 [phone]

## 2020-08-20 NOTE — ED Notes (Signed)
Note after discharge - pts daughter, Carlisle Beers, [phone], called upset that pt was discharged in blue scrub pants because we had lost pts pants. Also daughter was looking for pts debt card, which per daughter, was in pants. I talked to Artist Pais and Sam who triaged pt. They said that pt came without pants. Per Velna Hatchet, EMS, who transported pt, pt did not have pants on and Brandi said that she told pts family to be sure to bring clothes to hospital since pt did not have pants. I called daughter to tell same. Left message that pt was transported without pants and for her to call if she had any questions.

## 2020-08-20 NOTE — ED Notes (Signed)
Pt's daughter given patient's DNR.

## 2020-08-21 ENCOUNTER — Telehealth: Payer: Self-pay | Admitting: Adult Health Nurse Practitioner

## 2020-08-21 ENCOUNTER — Ambulatory Visit: Payer: PPO | Admitting: Pharmacist

## 2020-08-21 DIAGNOSIS — C3412 Malignant neoplasm of upper lobe, left bronchus or lung: Secondary | ICD-10-CM

## 2020-08-21 DIAGNOSIS — E119 Type 2 diabetes mellitus without complications: Secondary | ICD-10-CM

## 2020-08-21 DIAGNOSIS — C7931 Secondary malignant neoplasm of brain: Secondary | ICD-10-CM

## 2020-08-21 DIAGNOSIS — Z794 Long term (current) use of insulin: Secondary | ICD-10-CM

## 2020-08-21 NOTE — Telephone Encounter (Signed)
Had discussion over IM on Epic with Dr. Aletha Halim staff.  Patient having quick decline and hospice referral has been put in.  Discussed hospice home and there is a waiting list and more appropriate and possibly quicker to start hospice at home and transition to hospice home if needed. Talked with daughter and daughter in law via conference call and discussed this with them.  They are encouraged to call with any questions or concerns until patient is seen by hospice Khrystian Schauf K. Olena Heckle NP

## 2020-08-21 NOTE — Patient Instructions (Addendum)
Visit Information  PATIENT GOALS: Goals Addressed              This Visit's Progress     Patient Stated   .  Medication Monitoring (pt-stated)        Patient Goals/Self-Care Activities . Over the next 90 days, patient will:  - check blood glucose using CGM at least three times daily, document, and provide at future appointments       Patient verbalizes understanding of instructions provided today and agrees to view in [place].   Plan: Closing CCM case due to hospice enrollment  Catie Darnelle Maffucci, PharmD, [place], [place] Clinical Pharmacist [employer] [facility] [place]

## 2020-08-21 NOTE — Chronic Care Management (AMB) (Signed)
Chronic Care Management Pharmacy Note  08/21/2020 Name:  Kimberly Huff MRN:  [id number] DOB:  [birth year]  Subjective: Kimberly Huff is an 75 y.o. year old female who is a primary patient of Derrel Nip, Aris Everts, MD.  The CCM team was consulted for assistance with disease management and care coordination needs.    Engaged with patient's daughter, [name] by telephone for follow up visit in response to provider referral for pharmacy case management and/or care coordination services.   Consent to Services:  The patient was given information about Chronic Care Management services, agreed to services, and gave verbal consent prior to initiation of services.  Please see initial visit note for detailed documentation.   Patient Care Team: [name], MD as PCP - General (Internal Medicine) [name], MD (General Surgery) [name], MD (Internal Medicine) Telford Nab, RN as Oncology Nurse Navigator [name], RPH-CPP (Pharmacist)  Recent office visits: None since our last call  Recent consult visits:  4/13 - Dr. Pete Glatter Borders. Trial of olanzapine; tapering dexamethasone  4/14 - palliative home visit  Hospital visits: 4/19 - ED visit s/p fall. Hospice orders placed  Objective:  Lab Results  Component Value Date   CREATININE 0.77 08/14/2020   CREATININE 0.75 08/06/2020   CREATININE 0.78 07/22/2020    Lab Results  Component Value Date   HGBA1C 7.3 (H) 02/02/2020   Last diabetic Eye exam:  Lab Results  Component Value Date/Time   HMDIABEYEEXA No Retinopathy 10/02/2016 12:00 AM    Last diabetic Foot exam:  Lab Results  Component Value Date/Time   HMDIABFOOTEX normal 03/07/2014 12:00 AM        Component Value Date/Time   CHOL 101 01/29/2020 0856   TRIG 201.0 (H) 01/29/2020 0856   HDL 25.20 (L) 01/29/2020 0856   CHOLHDL 4 01/29/2020 0856   VLDL 40.2 (H) 01/29/2020 0856   LDLCALC 38 10/04/2019 0802   LDLDIRECT 58.0 01/29/2020 0856     Hepatic Function Latest Ref Rng & Units 08/14/2020 08/06/2020 07/22/2020  Total Protein 6.5 - 8.1 g/dL 5.4(L) 5.8(L) 6.9  Albumin 3.5 - 5.0 g/dL 3.1(L) 3.1(L) 3.7  AST 15 - 41 U/L 18 15 14(L)  ALT 0 - 44 U/L 28 26 11   Alk Phosphatase 38 - 126 U/L 36(L) 40 75  Total Bilirubin 0.3 - 1.2 mg/dL 0.7 0.6 0.9    Lab Results  Component Value Date/Time   TSH 0.76 09/10/2014 05:01 PM   TSH 0.83 10/25/2013 08:04 AM    CBC Latest Ref Rng & Units 08/14/2020 08/06/2020 07/22/2020  WBC 4.0 - 10.5 K/uL 6.9 11.0(H) 4.9  Hemoglobin 12.0 - 15.0 g/dL 11.9(L) 12.1 12.0  Hematocrit 36.0 - 46.0 % 39.2 38.8 38.2  Platelets 150 - 400 K/uL 93(L) 160 217    Lab Results  Component Value Date/Time   VD25OH 19.71 (L) 06/29/2019 08:19 AM   VD25OH 46.54 01/24/2015 09:34 AM    Clinical ASCVD: Yes  The ASCVD Risk score Mikey Bussing DC Jr., et al., 2013) failed to calculate for the following reasons:   The patient has a prior MI or stroke diagnosis     Social History   Tobacco Use  Smoking Status Former Smoker  . Packs/day: 0.50  . Years: 38.00  . Pack years: 19.00  . Types: Cigarettes  . Quit date: 09/15/1998  . Years since quitting: 21.9  Smokeless Tobacco Never Used   BP Readings from Last 3 Encounters:  08/20/20 (!) 114/47  08/15/20 (!) 108/54  08/14/20 119/63   Pulse Readings from Last 3 Encounters:  08/20/20 (!) 50  08/15/20 73  08/14/20 69   Wt Readings from Last 3 Encounters:  08/20/20 172 lb (78 kg)  08/14/20 189 lb 1.6 oz (85.8 kg)  07/22/20 165 lb 2 oz (74.9 kg)     Assessment: Review of patient past medical history, allergies, medications, health status, including review of consultants reports, laboratory and other test data, was performed as part of comprehensive evaluation and provision of chronic care management services.   SDOH:  (Social Determinants of Health) assessments and interventions performed:  SDOH Interventions   Flowsheet Row Most Recent Value  SDOH Interventions    Financial Strain Interventions Intervention Not Indicated  Housing Interventions Other (Comment)  [collaboration with palliative/hospice team]      CCM Care Plan  No Known Allergies  Medications Reviewed Today    Reviewed by Berneta Sages, NP (Nurse Practitioner) on 08/15/20 at 2113  Med List Status: <None>  Medication Order Taking? Sig Documenting Provider Last Dose Status Informant  acetaminophen (TYLENOL) 500 MG tablet [id number] No Take 500 mg by mouth every 6 (six) hours as needed. [provider] Taking Active   Continuous Blood Gluc Receiver (FREESTYLE LIBRE 14 DAY READER) [place] [id number] No Use to check BS 4 times daily [name], MD Taking Active   Continuous Blood Gluc Sensor (FREESTYLE LIBRE 14 DAY SENSOR) [place] [id number] No Use to check BS 4 times daily [name], MD Taking Active   denosumab (PROLIA) 60 MG/ML SOSY injection [id number] No Inject into the skin. [provider] Taking Active   dexamethasone (DECADRON) 2 MG tablet [id number] No Take 1 tablet (2 mg total) by mouth 2 (two) times daily with a meal. Chrystal, Eulas Post, MD Taking Active   furosemide (LASIX) 40 MG tablet [id number] No TAKE ONE TABLET EVERY DAY  Patient not taking: No sig reported   [name], MD Not Taking Active   insulin glargine (LANTUS SOLOSTAR) 100 UNIT/ML Solostar Pen [id number] No Inject 17 Units into the skin daily. [name], MD Taking Active   Insulin Pen Needle 32G X 6 MM MISC [id number] No Use to inject insulin daily [name], MD Taking Active   levETIRAcetam (KEPPRA) 500 MG tablet [id number] No Take 1 tablet (500 mg total) by mouth 2 (two) times daily. Borders, [name], NP Taking Active   lidocaine-prilocaine (EMLA) cream [id number] No Apply 1 application topically as needed. 30-45 mins prior to port access. [name], MD Taking Active   losartan (COZAAR) 50 MG tablet [id number] No Take 1 tablet (50 mg total) by mouth daily. [name], MD Taking Active   morphine (MS CONTIN) 30 MG 12 hr tablet [id number] No Take 1 tablet (30 mg total) by mouth every 8 (eight) hours. Borders, [name], NP Taking Active   naloxone [facility]) nasal spray 4 mg/0.1 mL [id number] No 1 spray into nostril upon signs of opioid overdose. Call 911. May repeat once if no response within 2-3 minutes. Borders, [name], NP Taking Active   OLANZapine (ZYPREXA) 10 MG tablet [id number]  Take 1 tablet (10 mg total) by mouth at bedtime as needed (nausea/sleep). Borders, [name], NP  Active   omeprazole (PRILOSEC) 40 MG capsule [id number] No TAKE 1 CAPSULE BY MOUTH ONCE DAILY [name], MD Taking Active   ondansetron (ZOFRAN) 8 MG tablet [id number] No One pill every 8 hours as needed for nausea/vomitting.  Borders, [name], NP Taking Active   ONETOUCH ULTRA test strip [id number] No USE AS INSTRUCTED [name], MD Taking Active   Oxycodone HCl 20 MG TABS [id number] No Take 1-2 tablets (20-40 mg total) by mouth every 4 (four) hours as needed (breakthrough pain). Borders, [name], NP Taking Active   prochlorperazine (COMPAZINE) 10 MG tablet [id number] No Take 1 tablet (10 mg total) by mouth every 6 (six) hours as needed for nausea or vomiting. [name], MD Taking Active   rosuvastatin (CRESTOR) 10 MG tablet [id number] No TAKE ONE TABLET EVERY DAY [name], MD Taking Active   sucralfate (CARAFATE) 1 g tablet [id number] No Take 0.5 tablets (0.5 g total) by mouth 3 (three) times daily. Dissolve in 3-4 tbsp warm water, swallow. [name], MD Taking Active            Med Note Nat Christen Apr 18, 2020  3:46 PM) Taking PRN  triamcinolone ointment (KENALOG) 0.5 % [id number] No Apply 1 application topically 2 (two) times daily. Borders, [name], NP Taking Active           Patient Active Problem List   Diagnosis Date Noted  . Aortic arch atherosclerosis ([place]) 03/19/2020  . Aortic aneurysm ([place]) 03/19/2020  . Primary  cancer of left upper lobe of lung ([place]) 02/26/2020  . Aneurysm of cavernous portion of right internal carotid artery 02/03/2020  . Brain mass 02/02/2020  . Brain metastasis ([place]) 02/02/2020  . Obesity (BMI 30.0-34.9)   . Back pain, chronic 01/22/2020  . OAB (overactive bladder) 12/13/2019  . Chronic cystitis 10/08/2019  . Pelvic mass in female 03/28/2019  . Thoracic spondylosis without myelopathy 03/28/2019  . Osteoporosis 11/12/2017  . Urge incontinence of urine 02/25/2016  . Hospital discharge follow-up 10/26/2015  . Benign essential HTN 10/25/2015  . Atrophic kidney 10/25/2015  . History of myocardial infarction 10/25/2015  . Microalbuminuria due to type 2 diabetes mellitus ([place]) 06/29/2015  . Personal history of gastric ulcer 02/23/2013  . Nontraumatic shoulder pain, right 02/23/2013  . Vitamin D deficiency 02/23/2013  . History of right humeral fracture  02/16/2012  . Gouty arthritis 08/25/2011  . Type 2 diabetes mellitus, controlled, with renal complications ([place]) 79/15/0569  . Hemorrhoid 05/27/2011  . Dyslipidemia associated with type 2 diabetes mellitus ([place]) 02/16/2011  . Cataract extraction status 02/16/2011  . Nonfunctioning left kidney 02/16/2011  . Cirrhosis, non-alcoholic ([place]) 79/48/0165    Immunization History  Administered Date(s) Administered  . Hepatitis B 02/16/2011  . Influenza, High Dose Seasonal PF 03/18/2018, 04/14/2019  . Influenza,inj,Quad PF,6+ Mos 02/22/2013, 03/07/2014  . Influenza-Unspecified 02/05/2015, 02/17/2016  . Moderna Sars-Covid-2 Vaccination 03/19/2020  . PFIZER(Purple Top)SARS-COV-2 Vaccination 05/09/2019, 05/30/2019  . Pneumococcal Conjugate-13 03/07/2014  . Pneumococcal Polysaccharide-23 03/16/2013, 07/12/2018  . Tdap 02/22/2013    Conditions to be addressed/monitored: HTN, HLD, DMII and Cancer  Care Plan : Medication Management  Updates made by [name], RPH-CPP since 08/21/2020 12:00 AM  Completed 08/21/2020   Problem: Diabetes, Cancer Resolved 08/21/2020    Long-Range Goal: Disease State Management Completed 08/21/2020  Start Date: 03/19/2020  This Visit's Progress: On track  Recent Progress: On track  Priority: High  Note:   Current Barriers:  . Unable to achieve control of diabetes  . Recent complex diagnosis  Pharmacist Clinical Goal(s):  Marland Kitchen Over the next 90 days, patient will achieve control of diabetes as evidenced by improvement in A1c.  Interventions: . 1:1 collaboration with Derrel Nip,  Aris Everts, MD regarding development and update of comprehensive plan of care as evidenced by provider attestation and co-signature . Inter-disciplinary care team collaboration (see longitudinal plan of care) . Comprehensive medication review performed; medication list updated in electronic medical record  Health Maintenance: . Hospice referral placed yesterday by Billey Chang, NP. Speaking with [name] today. She has gone back home to [place] to get some rest, she reports she has not slept more than 2 hours in several weeks. Notes patient is having falls, having personality changes (yelled at [facility] in the grocery store), and has episodes of confusion (made 3 cups of coffee in a row the other morning without realizing it). Also very concerned about a rash on her bottom. Reports redness, discomfort, and that it is cracked and bleeding, worse inside folds of buttock skin than on outside. Very concerned about developing infection. Collaborated w/ Palliative Care and Oncology teams to see if Hospice referral can be expedited. Dr. Rogue Bussing recommends miconazole 2% OTC cream to the site, provided this recommendation to [facility]. [name] notes that while she is at home, her sister in law, Alexiana Laverdure [phone]) is staying with the patient. Sarah's sister, Jan, lives down the street. Unclear if patient has someone with her 24/7.   Diabetes: . Uncontrolled per CGM download; current treatment: Lantus 17 units daily;   . Concurrent dexamethasone 2 mg BID with taper plan = 2 mg daily x 1 week, 2 mg every other day x 1 week,  o Hx metformin, d/c d/t GI upset and concurrent radiation therapy  . Current glucose readings- using Libre 14 Day CGM Date of Download:4/7-4/20/22 % Time CGM is active: 66% Average Glucose: 219 mg/dL Glucose Variability: 40.3 (goal <36%) Glucose Management Indicator: 8.5% Time in Goal:  - Time in range 70-180: 40% - Time above range: 59% - Time below range: 1% . Continue current regimen at this time. As dexamethasone tapers, expect to be able to decrease and eventually discontinue insulin. Monitor appetite and have low threshold for insulin discontinuation if appetite decreases.   Hypertension: . Controlled per last clinic readings; current treatment: losartan 50 mg daily, furosemide 40 mg PRN- instructed to use more frequently for steroid related edema. . Recommended to continue current regimen  Hyperlipidemia: . Controlled per last lab work; current treatment: rosuvastatin 10 mg daily . Recommended to continue current treatment regimen . Consider discontinuation to reduce pill burden  Lung Cancer with Brain Mets, Symptom Management: . Seizure (brain metastasis): levetiracetam 500 mg BID . Nausea: ondansetron 4 mg PRN, procholorperazine PRN. . Pain: morphine ER 30 mg Q8H PRN, oxycodone 20 mg 1-2 tab Q4H PRN, acetaminophen 325 mg PRN fever. . Constipation: senna/docusate PRN. . Mood/sleep: recently started on olanzapine 10 mg QPM for sleep.  . Brain mets: dexamethasone 2 mg taper - see above . Continue current regimen along with oncology and Hospice collaboration.   GERD: . Controlled per patient report. Current treatment: omeprazole 40 mg daily, sucralfate 0.5 g PRN acid related swallowing issues, though infrequent use.  Marland Kitchen Recommended to continue current regimen.  Gout Flare: . Improved. S/p treatment with colchicine 0.6 mg BID PRN . Continue to discuss additional non  pharmacologic treatment.   Patient Goals/Self-Care Activities . Over the next 90 days, patient will:  - check blood glucose using CGM at least three times daily, document, and provide at future appointments  Follow Up Plan: Closing CCM case due to Hospice enrollment      Medication Assistance: None required.  Patient affirms current  coverage meets needs.  Patient's preferred pharmacy is:  [facility] - [place], [place] - [address] [place] [address] Phone: [phone] Fax: [phone]   Follow Up:  Patient agrees to Care Plan and Follow-up.  Plan: Closing CCM case due to hospice enrollment  Catie Darnelle Maffucci, PharmD, [place], [place] Clinical Pharmacist [employer] at [place]

## 2020-08-26 ENCOUNTER — Other Ambulatory Visit: Payer: Self-pay | Admitting: Hospice and Palliative Medicine

## 2020-08-26 MED ORDER — OXYCODONE HCL 20 MG PO TABS: 20.0000 mg | ORAL_TABLET | ORAL | 0 refills | Status: DC | PRN

## 2020-08-26 NOTE — Progress Notes (Signed)
Hospice nurse requested refill of oxycodone.

## 2020-08-27 ENCOUNTER — Other Ambulatory Visit: Payer: Self-pay | Admitting: *Deleted

## 2020-08-27 MED ORDER — MORPHINE SULFATE ER 30 MG PO TBCR: 30.0000 mg | EXTENDED_RELEASE_TABLET | Freq: Three times a day (TID) | ORAL | 0 refills | Status: AC

## 2020-08-29 ENCOUNTER — Telehealth: Payer: Self-pay | Admitting: Hospice and Palliative Medicine

## 2020-08-29 MED ORDER — LORAZEPAM 0.5 MG PO TABS: 0.5000 mg | ORAL_TABLET | ORAL | 0 refills | Status: DC | PRN

## 2020-08-29 MED ORDER — MORPHINE SULFATE (CONCENTRATE) 10 MG /0.5 ML PO SOLN: 10.0000 mg | ORAL | 0 refills | Status: DC | PRN

## 2020-08-29 NOTE — Telephone Encounter (Signed)
I received a call from patient's hospice nurse.  Reportedly, patient's oral intake is declining.  They feel like she is having more difficulty swallowing and could be approaching end-of-life.  They requested morphine elixir and lorazepam.  Will send Rx.

## 2020-09-02 ENCOUNTER — Telehealth: Payer: Self-pay | Admitting: Hospice and Palliative Medicine

## 2020-09-02 ENCOUNTER — Other Ambulatory Visit: Payer: Self-pay | Admitting: Hospice and Palliative Medicine

## 2020-09-02 DIAGNOSIS — C7931 Secondary malignant neoplasm of brain: Secondary | ICD-10-CM

## 2020-09-02 MED ORDER — LORAZEPAM 1 MG/0.5ML PO CONC: ORAL | 0 refills | Status: AC

## 2020-09-02 MED ORDER — OXYCODONE HCL 20 MG PO TABS: 20.0000 mg | ORAL_TABLET | ORAL | 0 refills | Status: AC | PRN

## 2020-09-02 MED ORDER — DEXAMETHASONE 4 MG PO TABS: 4.0000 mg | ORAL_TABLET | Freq: Every day | ORAL | 0 refills | Status: AC

## 2020-09-02 MED ORDER — MORPHINE SULFATE (CONCENTRATE) 10 MG /0.5 ML PO SOLN: 10.0000 mg | ORAL | 0 refills | Status: DC | PRN

## 2020-09-02 NOTE — Telephone Encounter (Signed)
I received a call from patient's hospice nurse after her visit today. Patient was hypoxic and with agonal breathing. Agree with just keeping patient comfortable and forgoing dexamethasone. Will refill morphine elixir.

## 2020-09-02 NOTE — Progress Notes (Signed)
Will change lorazepam to elixir given difficulty swallowing.

## 2020-09-02 NOTE — Telephone Encounter (Signed)
I received a call from patient's daughter.  Patient started sleeping most of the day Thursday of last week following weaning of the dexamethasone.  She has subsequently had minimal oral intake and is mostly sleeping all day. Daughter was informed by hospice that patient was likely actively terminal.  However, I suspect symptoms are possibly secondary to discontinuation of steroids.  We will restart dexamethasone 4 mg daily and see if this improves patient's status.  Otherwise, daughter's goal is to keep patient home and comfortable.

## 2020-09-05 ENCOUNTER — Other Ambulatory Visit: Payer: Self-pay | Admitting: Hospice and Palliative Medicine

## 2020-09-05 MED ORDER — MORPHINE SULFATE (CONCENTRATE) 10 MG /0.5 ML PO SOLN: 10.0000 mg | ORAL | 0 refills | Status: AC | PRN

## 2020-09-05 NOTE — Progress Notes (Signed)
Refill of morphine requested by hospice nurse. #75m sent to pharmacy.

## 2020-09-07 ENCOUNTER — Telehealth: Payer: Self-pay | Admitting: Internal Medicine

## 2020-09-07 NOTE — Telephone Encounter (Signed)
On 05/06- I called patient's daughter and offered my condolences.  Family very thankful for the call. Appreciate the cancer center staff for the care and support provided to the patient.  GB

## 2020-09-16 ENCOUNTER — Encounter: Payer: PPO | Admitting: Hospice and Palliative Medicine

## 2020-09-16 ENCOUNTER — Ambulatory Visit: Payer: PPO | Admitting: Radiation Oncology

## 2020-10-02 DEATH — deceased

## 2020-10-14 ENCOUNTER — Ambulatory Visit: Payer: PPO

## 2020-10-16 ENCOUNTER — Ambulatory Visit: Payer: PPO | Admitting: Internal Medicine

## 2020-10-16 ENCOUNTER — Other Ambulatory Visit: Payer: PPO

## 2021-01-01 ENCOUNTER — Ambulatory Visit: Payer: PPO | Admitting: Urology

## 2021-06-17 ENCOUNTER — Ambulatory Visit: Payer: PPO

## 2022-01-28 IMAGING — MR MR HEAD WO/W CM
14 series · 48 of 48 positions shown · IV contrast (Contrast agent)
Comparison: CT head 02/02/2020

CLINICAL DATA: Seizure.  Abnormal CT head.

EXAM:
MRI HEAD WITHOUT AND WITH CONTRAST
TECHNIQUE: Multiplanar, multiecho pulse sequences of the brain and surrounding
structures were obtained without and with intravenous contrast.
CONTRAST:  9mL GADAVIST GADOBUTROL 1 MMOL/ML IV SOLN

[Series 5: ax dwi_tracew · axial · 3.0mm · 0.71mm/px · z∈[-66,+98]mm · 4 of 56 slices shown]
[im 1/56]
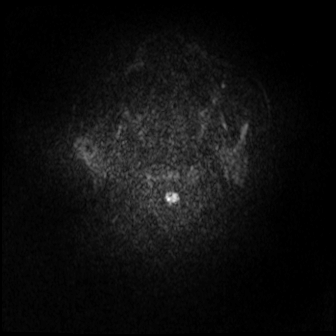
[im 19/56]
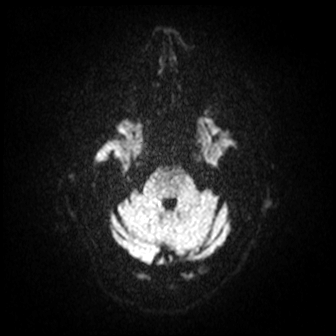
[im 37/56]
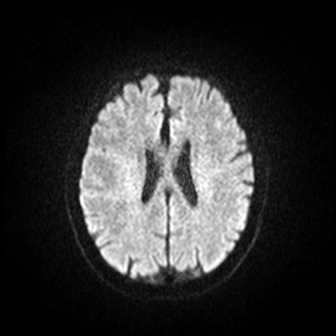
[im 56/56]
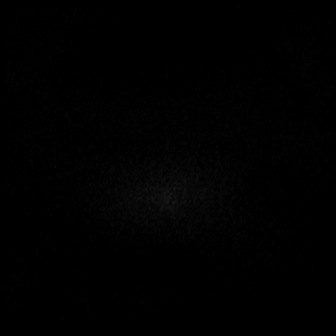

[Series 6: ax dwi_adc · axial · 3.0mm · 0.71mm/px · z∈[-66,+95]mm · 4 of 55 slices shown]
[im 1/55]
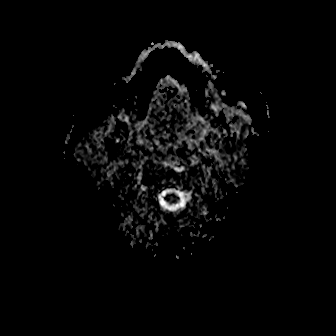
[im 19/55]
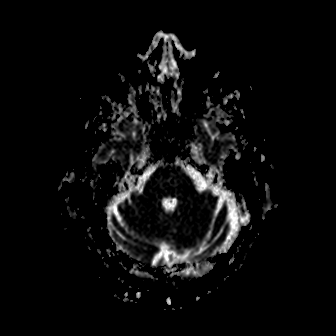
[im 37/55]
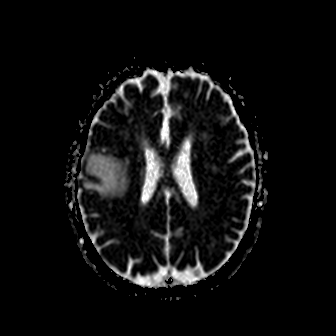
[im 55/55]
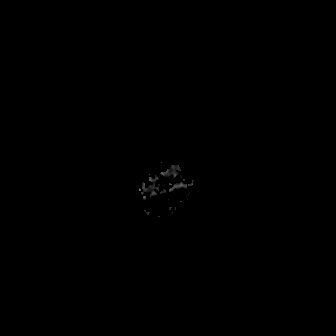

[Series 7: cor dwi_tracew · coronal · 5.0mm · 0.68mm/px · 2 of 36 slices shown]
[im 1/36]
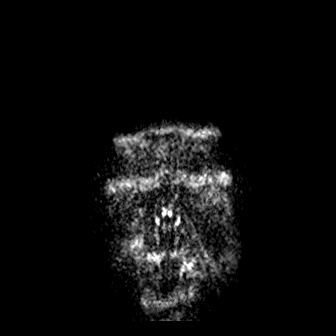
[im 36/36]
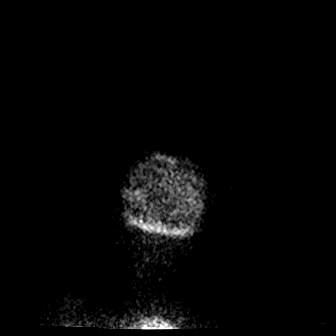

[Series 8: cor dwi_adc · coronal · 5.0mm · 0.68mm/px · 2 of 36 slices shown]
[im 1/36]
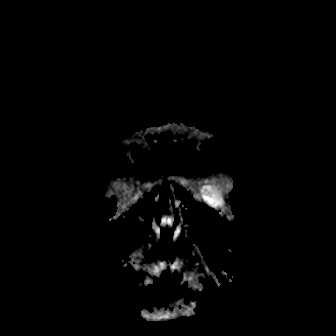
[im 36/36]
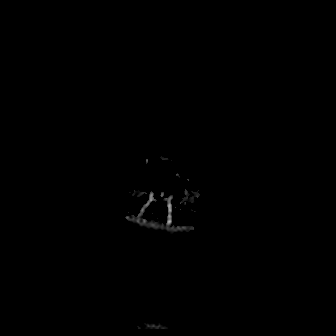

[Series 9: T1 · sagittal · 5.0mm · 0.62mm/px · 1 of 23 slices shown (1 of 2)]
[im 1/23]
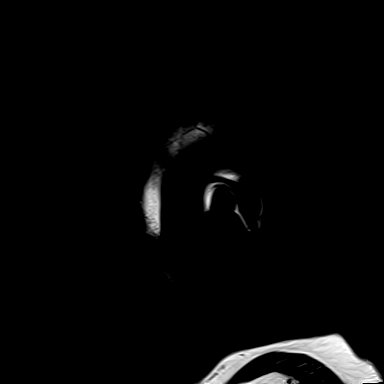

[Series 10: T2 · axial · 5.0mm · 0.53mm/px · 1 of 25 slices shown]
[im 1/25]
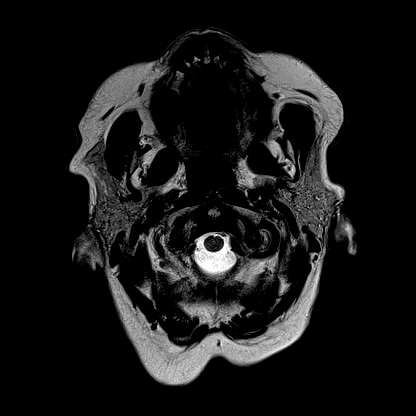

[Series 12: pha_images · axial · 3.0mm · 0.90mm/px · z∈[-52,+94]mm · 3 of 50 slices shown]
[im 1/50]
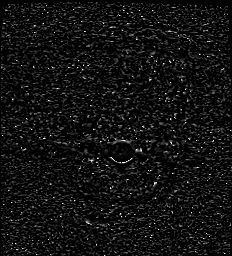
[im 25/50]
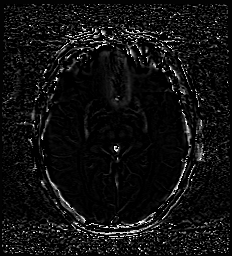
[im 50/50]
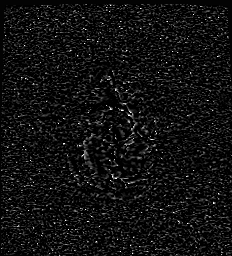

[Series 13: swi_images · axial · 3.0mm · 0.90mm/px · z∈[-52,+100]mm · 3 of 52 slices shown]
[im 1/52]
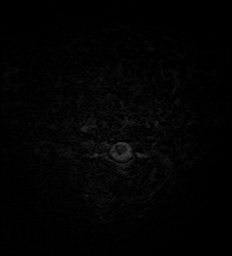
[im 26/52]
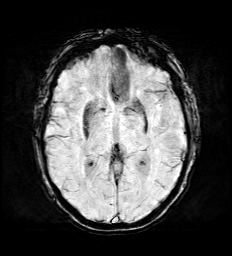
[im 52/52]
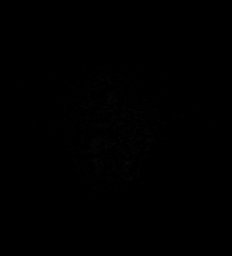

[Series 15: FLAIR · axial · 3.0mm · 0.53mm/px · z∈[-57,+104]mm · 3 of 55 slices shown]
[im 1/55]
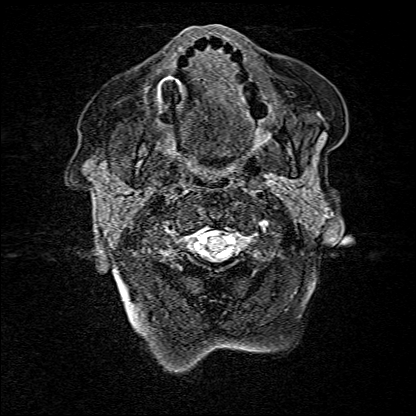
[im 28/55]
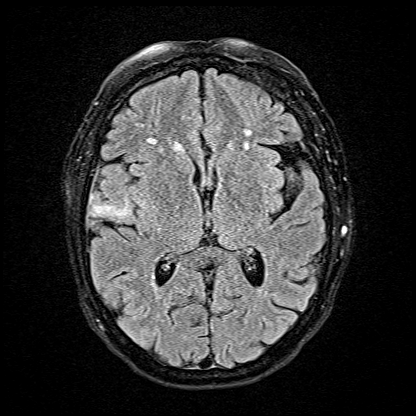
[im 55/55]
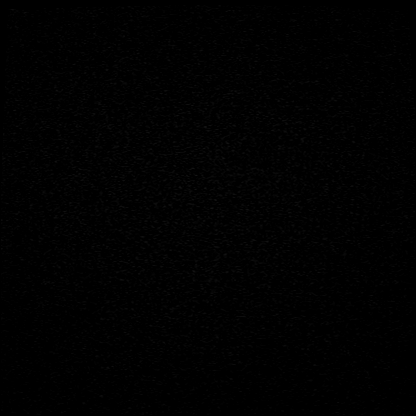

[Series 16: T1 · axial · 1.0mm · 0.98mm/px · z∈[-63,+108]mm · 10 of 173 slices shown (2 of 2)]
[im 1/173]
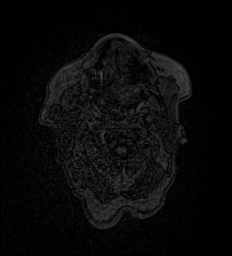
[im 20/173]
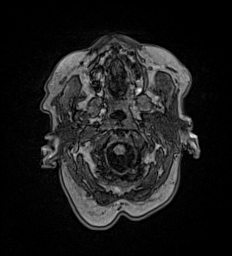
[im 39/173]
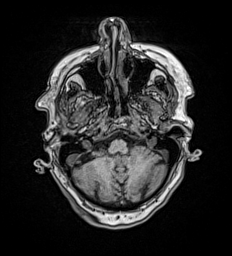
[im 58/173]
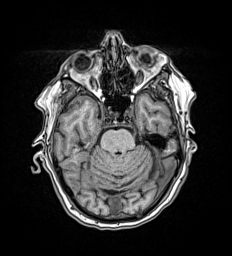
[im 77/173]
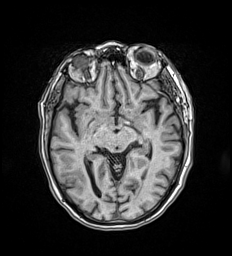
[im 96/173]
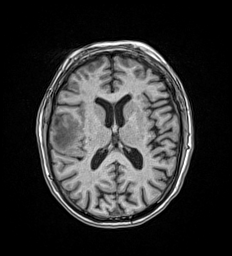
[im 115/173]
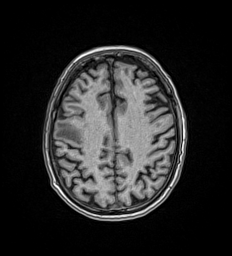
[im 134/173]
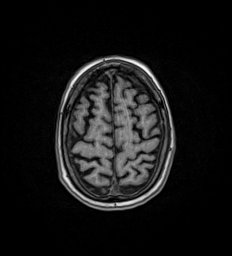
[im 153/173]
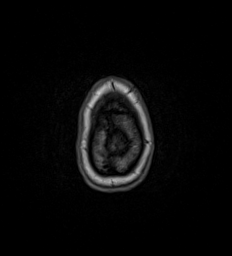
[im 173/173]
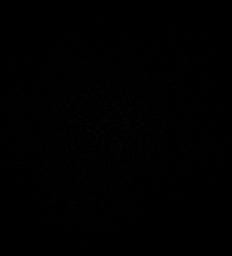

[Series 17: T2 post-contrast · coronal · 5.0mm · 0.57mm/px · 2 of 29 slices shown]
[im 1/29]
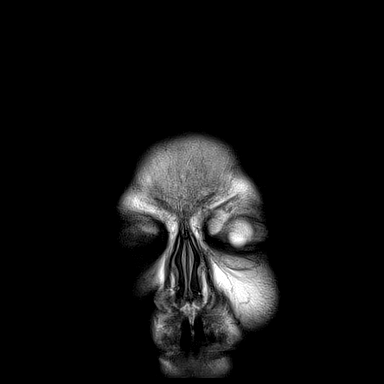
[im 29/29]
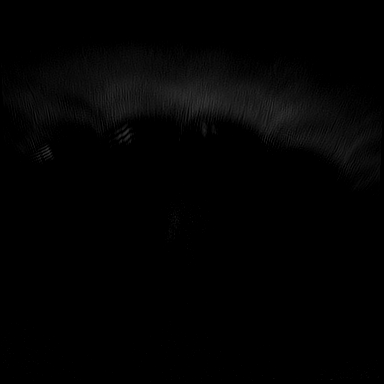

[Series 18: T1 post-contrast · axial · 1.0mm · 0.98mm/px · z∈[-63,+110]mm · 10 of 173 slices shown (1 of 3)]
[im 1/173]
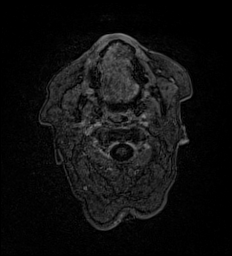
[im 20/173]
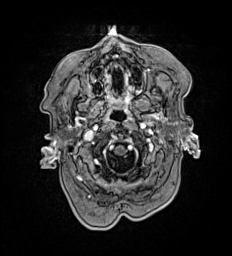
[im 39/173]
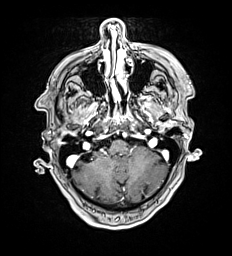
[im 58/173]
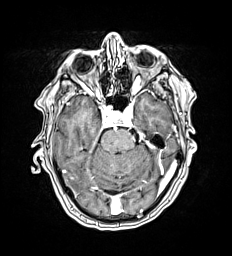
[im 77/173]
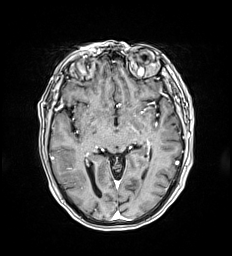
[im 96/173]
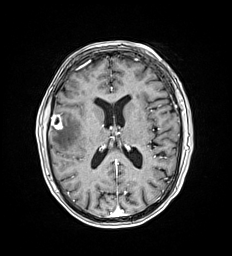
[im 115/173]
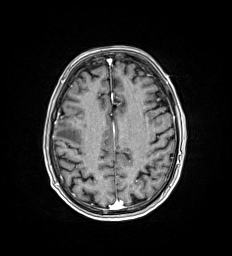
[im 134/173]
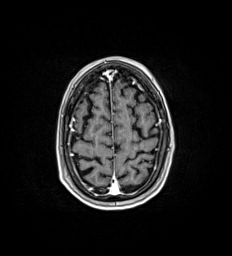
[im 153/173]
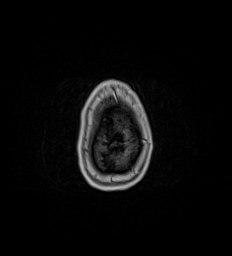
[im 173/173]
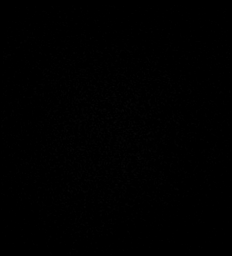

[Series 19: T1 post-contrast · coronal · 5.0mm · 0.57mm/px · 2 of 29 slices shown (2 of 3)]
[im 1/29]
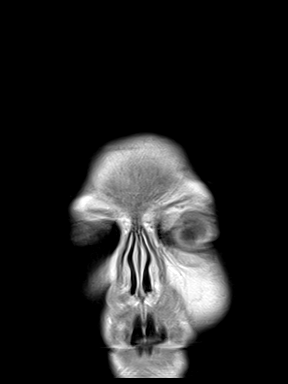
[im 29/29]
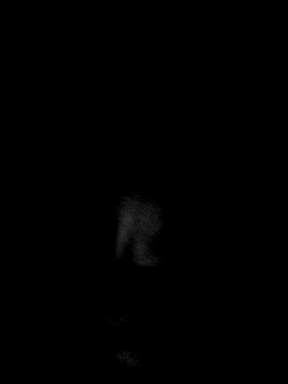

[Series 20: T1 post-contrast · sagittal · 5.0mm · 0.62mm/px · 1 of 23 slices shown (3 of 3)]
[im 1/23]
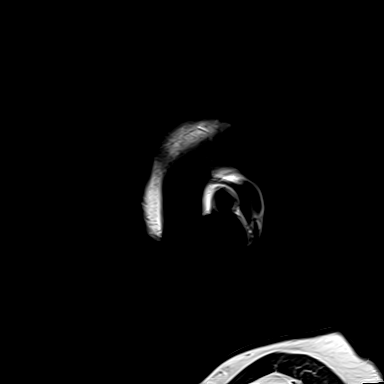

[48 of 48 positions shown; findings below may reference images not displayed]

FINDINGS: Brain: Peripheral enhancing mass lesion in the right middle frontal
lobe laterally with central necrosis and surrounding edema. The
enhancing mass measures up to 15 mm in diameter. This is
peripherally located.

Second peripherally enhancing mass lesion in the right posterior
temporal lobe measures 9 mm in diameter with surrounding vasogenic
edema.

No third lesion identified.

Negative for acute infarct. Scattered small white matter
hyperintensities compatible with mild chronic microvascular
ischemia. Negative for hydrocephalus or hemorrhage.

Vascular: Normal arterial flow voids

Skull and upper cervical spine: No focal skeletal lesion.

Sinuses/Orbits: Mild mucosal edema paranasal sinuses. Bilateral
cataract extraction

Other: None
IMPRESSION: Two enhancing mass lesions are present in the right cerebral
hemisphere with surrounding edema and central necrosis compatible
with metastatic disease. No midline shift

Mild chronic microvascular ischemic change in the white matter. No
acute infarct.

## 2022-04-14 NOTE — Telephone Encounter (Signed)
MyChart messgae sent to patient.
